# Patient Record
Sex: Female | Born: 1970 | Race: White | Hispanic: No | Marital: Married | Smoking: Never smoker
Health system: Southern US, Community
[De-identification: ages and names within clinical notes are randomized; demographics above are authoritative.]

## PROBLEM LIST (undated history)

## (undated) DIAGNOSIS — R112 Nausea with vomiting, unspecified: Secondary | ICD-10-CM

## (undated) DIAGNOSIS — Z87442 Personal history of urinary calculi: Secondary | ICD-10-CM

## (undated) DIAGNOSIS — Z794 Long term (current) use of insulin: Secondary | ICD-10-CM

## (undated) DIAGNOSIS — R319 Hematuria, unspecified: Secondary | ICD-10-CM

## (undated) DIAGNOSIS — IMO0001 Reserved for inherently not codable concepts without codable children: Secondary | ICD-10-CM

## (undated) DIAGNOSIS — G4733 Obstructive sleep apnea (adult) (pediatric): Secondary | ICD-10-CM

## (undated) DIAGNOSIS — N390 Urinary tract infection, site not specified: Secondary | ICD-10-CM

## (undated) DIAGNOSIS — Z923 Personal history of irradiation: Secondary | ICD-10-CM

## (undated) DIAGNOSIS — Z8542 Personal history of malignant neoplasm of other parts of uterus: Secondary | ICD-10-CM

## (undated) DIAGNOSIS — C801 Malignant (primary) neoplasm, unspecified: Secondary | ICD-10-CM

## (undated) DIAGNOSIS — R3915 Urgency of urination: Secondary | ICD-10-CM

## (undated) DIAGNOSIS — R0789 Other chest pain: Secondary | ICD-10-CM

## (undated) DIAGNOSIS — E669 Obesity, unspecified: Secondary | ICD-10-CM

## (undated) DIAGNOSIS — Z8639 Personal history of other endocrine, nutritional and metabolic disease: Secondary | ICD-10-CM

## (undated) DIAGNOSIS — N3281 Overactive bladder: Secondary | ICD-10-CM

## (undated) DIAGNOSIS — Z9889 Other specified postprocedural states: Secondary | ICD-10-CM

## (undated) DIAGNOSIS — K7460 Unspecified cirrhosis of liver: Secondary | ICD-10-CM

## (undated) DIAGNOSIS — H9190 Unspecified hearing loss, unspecified ear: Secondary | ICD-10-CM

## (undated) DIAGNOSIS — E785 Hyperlipidemia, unspecified: Secondary | ICD-10-CM

## (undated) DIAGNOSIS — R0600 Dyspnea, unspecified: Secondary | ICD-10-CM

## (undated) DIAGNOSIS — R748 Abnormal levels of other serum enzymes: Secondary | ICD-10-CM

## (undated) DIAGNOSIS — E119 Type 2 diabetes mellitus without complications: Secondary | ICD-10-CM

## (undated) DIAGNOSIS — F909 Attention-deficit hyperactivity disorder, unspecified type: Secondary | ICD-10-CM

## (undated) DIAGNOSIS — R1031 Right lower quadrant pain: Secondary | ICD-10-CM

## (undated) DIAGNOSIS — F609 Personality disorder, unspecified: Secondary | ICD-10-CM

## (undated) DIAGNOSIS — M1990 Unspecified osteoarthritis, unspecified site: Secondary | ICD-10-CM

## (undated) DIAGNOSIS — K219 Gastro-esophageal reflux disease without esophagitis: Secondary | ICD-10-CM

## (undated) DIAGNOSIS — F329 Major depressive disorder, single episode, unspecified: Secondary | ICD-10-CM

## (undated) DIAGNOSIS — R632 Polyphagia: Secondary | ICD-10-CM

## (undated) DIAGNOSIS — F32A Depression, unspecified: Secondary | ICD-10-CM

## (undated) DIAGNOSIS — F419 Anxiety disorder, unspecified: Secondary | ICD-10-CM

## (undated) DIAGNOSIS — K429 Umbilical hernia without obstruction or gangrene: Secondary | ICD-10-CM

## (undated) DIAGNOSIS — F603 Borderline personality disorder: Secondary | ICD-10-CM

## (undated) DIAGNOSIS — E890 Postprocedural hypothyroidism: Secondary | ICD-10-CM

## (undated) DIAGNOSIS — N201 Calculus of ureter: Secondary | ICD-10-CM

## (undated) HISTORY — DX: Malignant (primary) neoplasm, unspecified: C80.1

## (undated) HISTORY — PX: ENDOMETRIAL BIOPSY: SHX622

## (undated) HISTORY — DX: Urinary tract infection, site not specified: N39.0

## (undated) HISTORY — PX: MOUTH SURGERY: SHX715

## (undated) HISTORY — DX: Right lower quadrant pain: R10.31

## (undated) HISTORY — PX: ROBOTIC ASSISTED TOTAL HYSTERECTOMY WITH BILATERAL SALPINGO OOPHERECTOMY: SHX6086

## (undated) HISTORY — DX: Depression, unspecified: F32.A

## (undated) HISTORY — DX: Hyperlipidemia, unspecified: E78.5

## (undated) HISTORY — DX: Polyphagia: R63.2

## (undated) HISTORY — DX: Gastro-esophageal reflux disease without esophagitis: K21.9

## (undated) HISTORY — DX: Overactive bladder: N32.81

## (undated) HISTORY — DX: Personality disorder, unspecified: F60.9

## (undated) HISTORY — DX: Major depressive disorder, single episode, unspecified: F32.9

## (undated) HISTORY — PX: TONSILLECTOMY: SUR1361

---

## 1991-06-20 HISTORY — PX: TYMPANOPLASTY: SHX33

## 1999-09-22 ENCOUNTER — Encounter: Payer: Self-pay | Admitting: Cardiology

## 1999-09-22 ENCOUNTER — Ambulatory Visit (HOSPITAL_COMMUNITY): Admission: RE | Admit: 1999-09-22 | Discharge: 1999-09-22 | Payer: Self-pay | Admitting: Cardiology

## 2000-02-02 ENCOUNTER — Emergency Department (HOSPITAL_COMMUNITY): Admission: EM | Admit: 2000-02-02 | Discharge: 2000-02-02 | Payer: Self-pay | Admitting: Emergency Medicine

## 2000-02-02 ENCOUNTER — Encounter: Payer: Self-pay | Admitting: Emergency Medicine

## 2000-09-13 ENCOUNTER — Ambulatory Visit (HOSPITAL_BASED_OUTPATIENT_CLINIC_OR_DEPARTMENT_OTHER): Admission: RE | Admit: 2000-09-13 | Discharge: 2000-09-13 | Payer: Self-pay | Admitting: *Deleted

## 2000-09-13 HISTORY — PX: OTHER SURGICAL HISTORY: SHX169

## 2000-12-05 ENCOUNTER — Other Ambulatory Visit: Admission: RE | Admit: 2000-12-05 | Discharge: 2000-12-05 | Payer: Self-pay | Admitting: Obstetrics and Gynecology

## 2001-06-28 ENCOUNTER — Ambulatory Visit (HOSPITAL_COMMUNITY): Admission: RE | Admit: 2001-06-28 | Discharge: 2001-06-28 | Payer: Self-pay | Admitting: Cardiology

## 2001-06-28 ENCOUNTER — Encounter: Payer: Self-pay | Admitting: Cardiology

## 2001-12-14 ENCOUNTER — Emergency Department (HOSPITAL_COMMUNITY): Admission: EM | Admit: 2001-12-14 | Discharge: 2001-12-14 | Payer: Self-pay | Admitting: Emergency Medicine

## 2002-06-20 ENCOUNTER — Ambulatory Visit (HOSPITAL_COMMUNITY): Admission: RE | Admit: 2002-06-20 | Discharge: 2002-06-20 | Payer: Self-pay | Admitting: Otolaryngology

## 2002-09-24 ENCOUNTER — Encounter: Payer: Self-pay | Admitting: Emergency Medicine

## 2002-09-24 ENCOUNTER — Emergency Department (HOSPITAL_COMMUNITY): Admission: EM | Admit: 2002-09-24 | Discharge: 2002-09-24 | Payer: Self-pay | Admitting: Emergency Medicine

## 2002-11-10 ENCOUNTER — Encounter: Admission: RE | Admit: 2002-11-10 | Discharge: 2002-12-24 | Payer: Self-pay | Admitting: Specialist

## 2004-03-06 ENCOUNTER — Emergency Department (HOSPITAL_COMMUNITY): Admission: EM | Admit: 2004-03-06 | Discharge: 2004-03-06 | Payer: Self-pay | Admitting: Emergency Medicine

## 2004-05-20 ENCOUNTER — Ambulatory Visit (HOSPITAL_COMMUNITY): Admission: RE | Admit: 2004-05-20 | Discharge: 2004-05-20 | Payer: Self-pay | Admitting: Cardiology

## 2004-05-30 ENCOUNTER — Emergency Department (HOSPITAL_COMMUNITY): Admission: EM | Admit: 2004-05-30 | Discharge: 2004-05-30 | Payer: Self-pay | Admitting: Emergency Medicine

## 2004-05-30 IMAGING — CR DG TIBIA/FIBULA 2V*L*
2 series · 2 of 2 positions shown · non-contrast
Comparison: none

CLINICAL DATA: Fall.  Left leg trauma and pain.
 LEFT TIBIA/FIBULA - 2 VIEW:
 Mild soft tissue swelling is seen along the lateral aspect of the distal fibula.  There is no evidence of fracture or other bone lesions.

[view not recorded (1 of 2)]
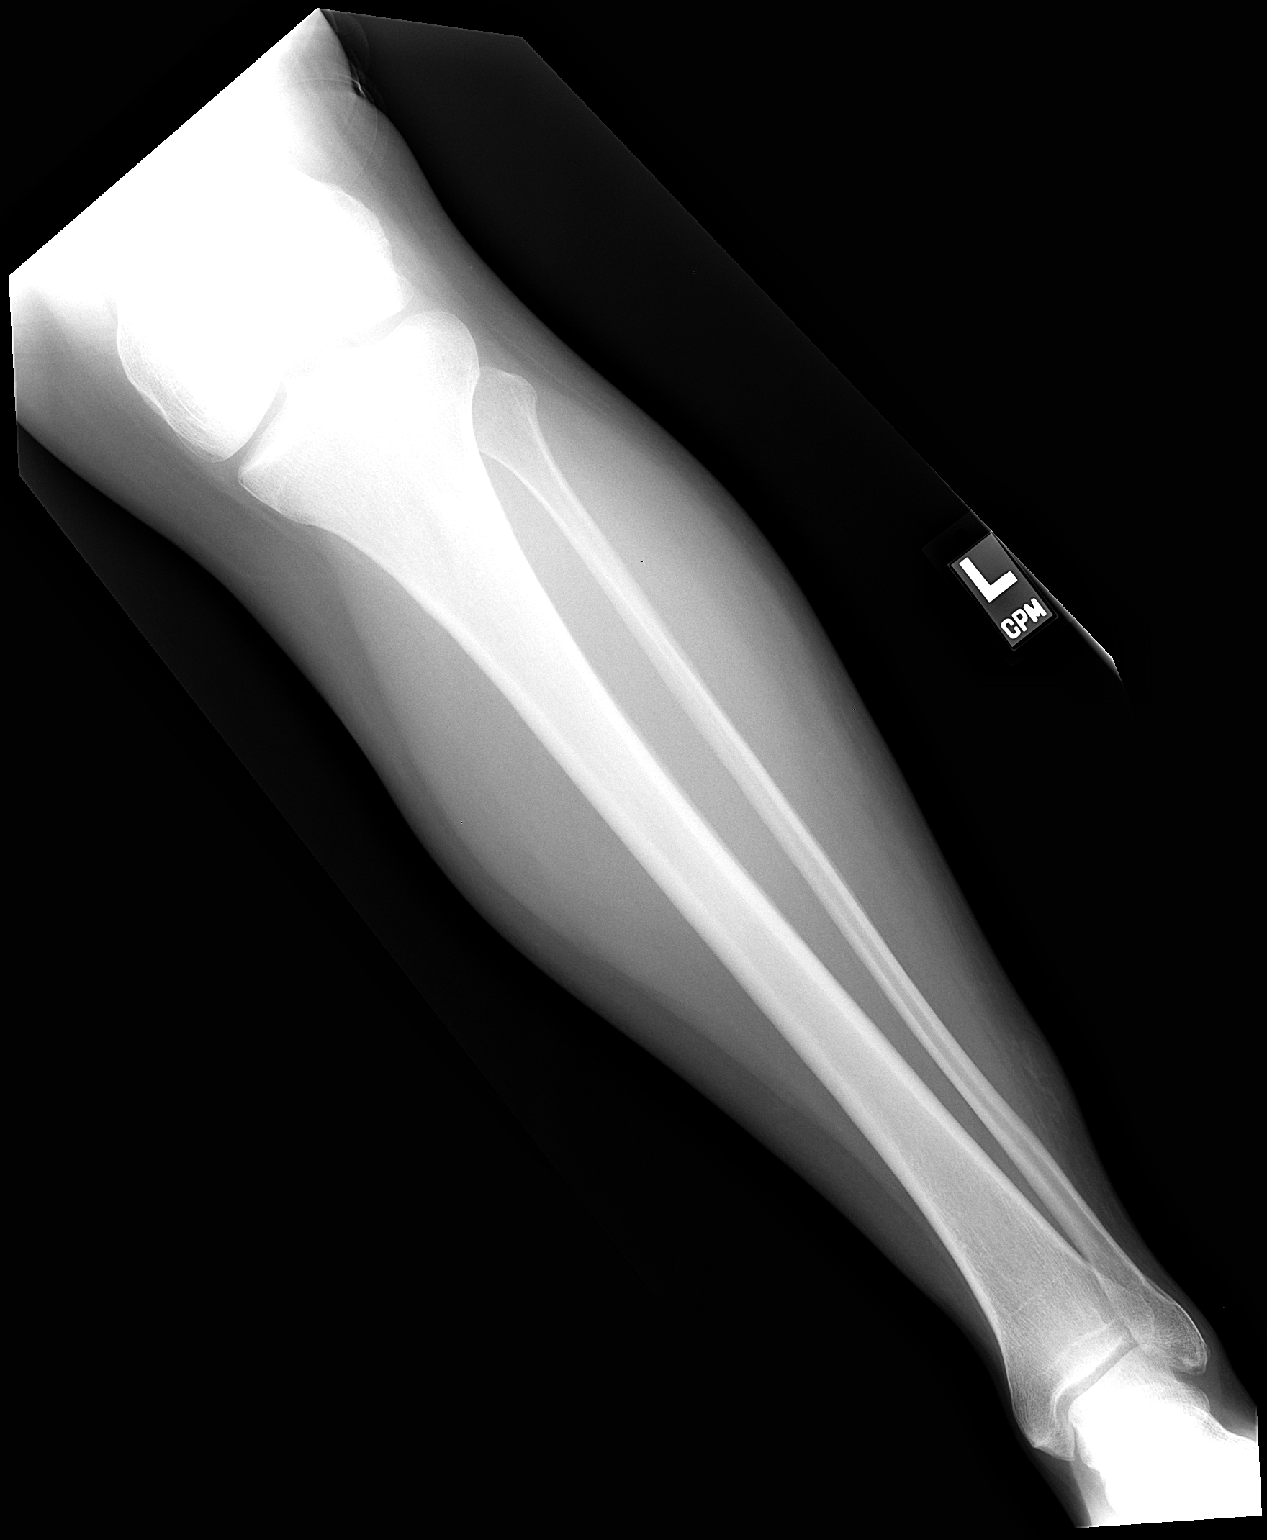

[view not recorded (2 of 2)]
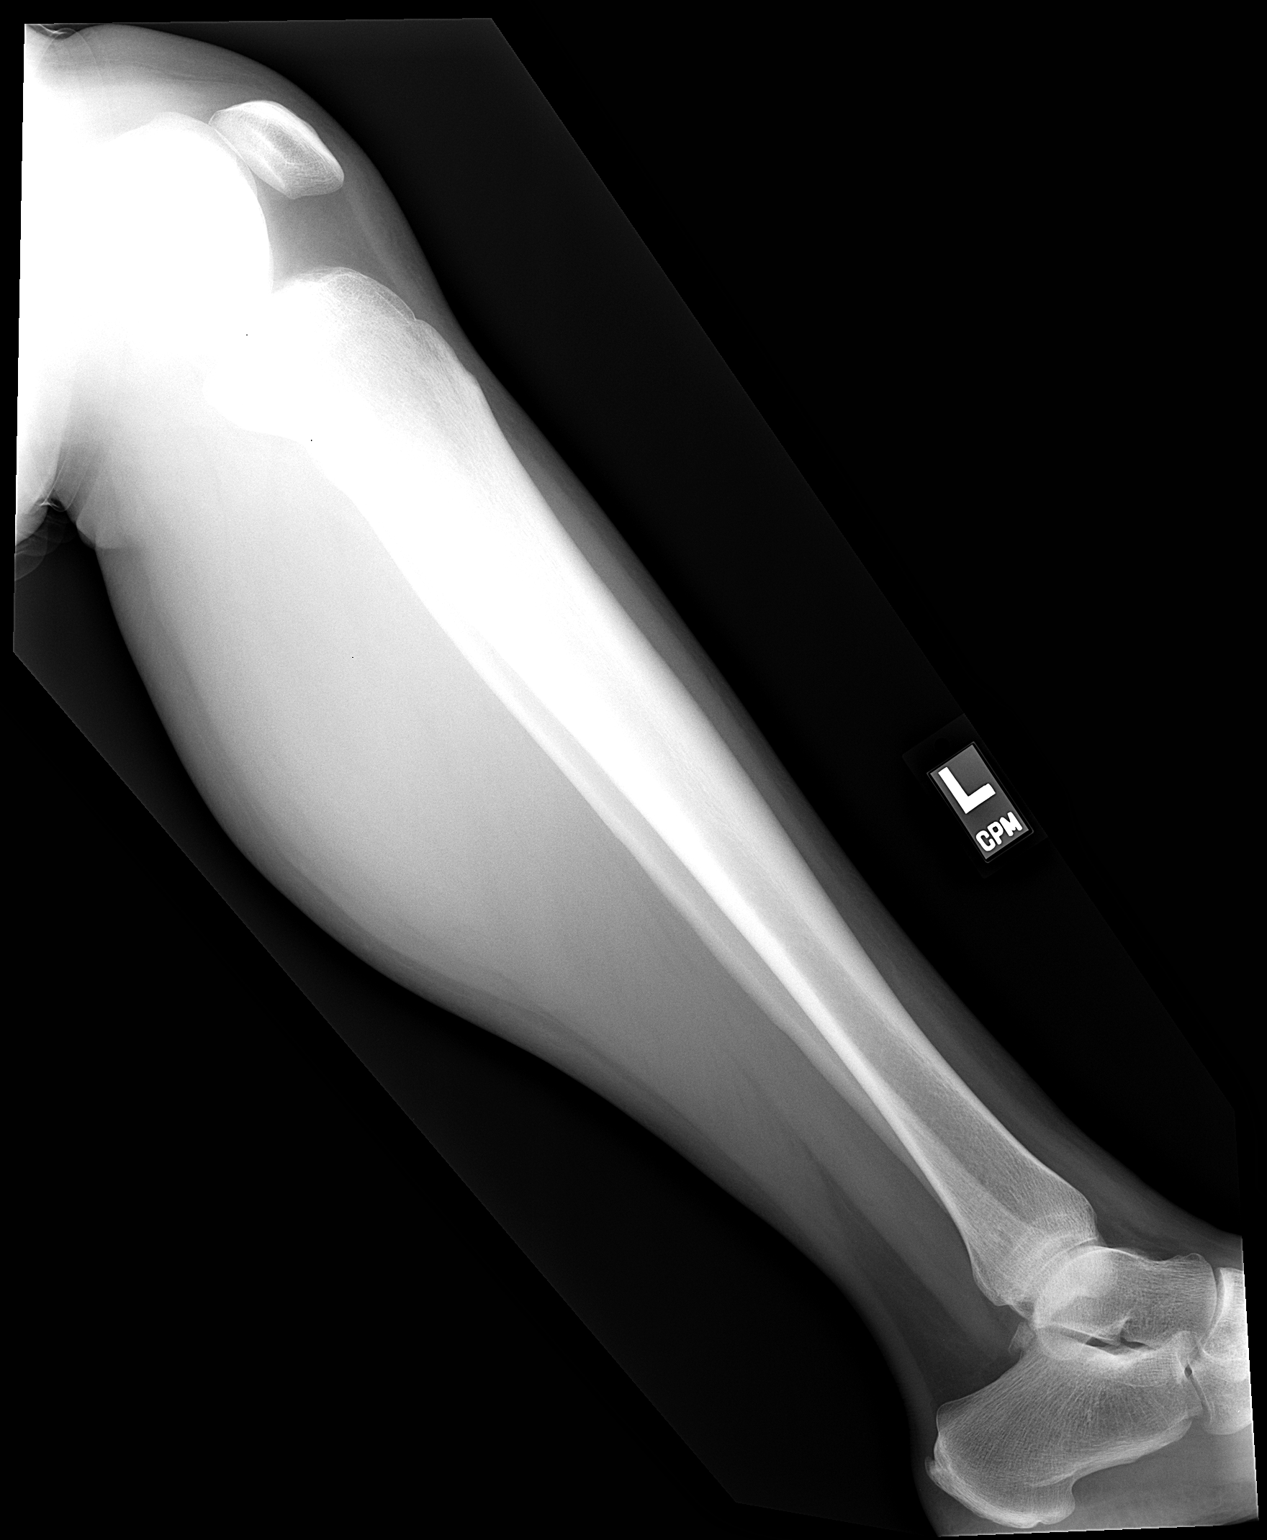

[2 of 2 positions shown; findings below may reference images not displayed]

IMPRESSION: Mild lateral soft tissue swelling of the lower leg. No evidence of fracture.

## 2004-08-08 ENCOUNTER — Other Ambulatory Visit: Admission: RE | Admit: 2004-08-08 | Discharge: 2004-08-08 | Payer: Self-pay | Admitting: Obstetrics and Gynecology

## 2005-08-09 ENCOUNTER — Other Ambulatory Visit: Admission: RE | Admit: 2005-08-09 | Discharge: 2005-08-09 | Payer: Self-pay | Admitting: Obstetrics and Gynecology

## 2007-04-29 ENCOUNTER — Ambulatory Visit (HOSPITAL_COMMUNITY): Admission: RE | Admit: 2007-04-29 | Discharge: 2007-04-29 | Payer: Self-pay | Admitting: Cardiology

## 2007-04-29 IMAGING — CR DG LUMBAR SPINE COMPLETE 4+V
5 series · 5 of 5 positions shown · non-contrast
Comparison: Lumbar spine series [DATE].

CLINICAL DATA: Back pain. Sharp mid back and bilateral leg pain.
LUMBAR SPINE ? 5 VIEW:

[t l-spine a.p.]
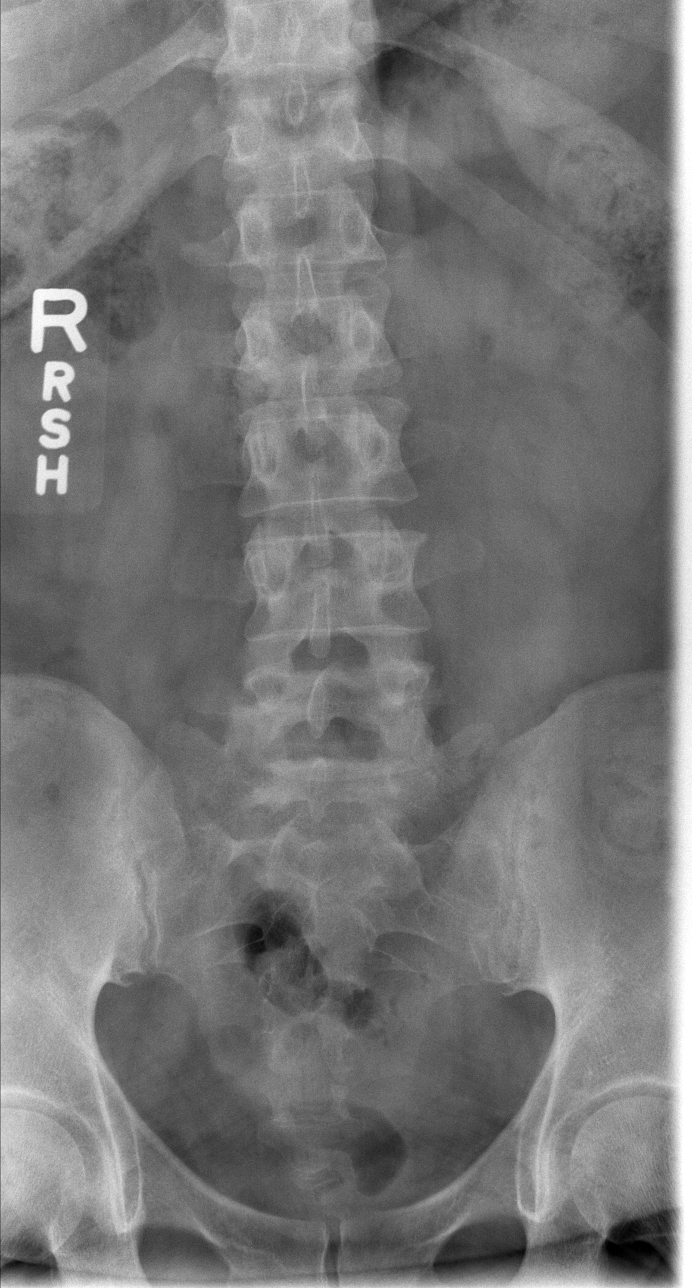

[t l-spine oblique exposure (1 of 2)]
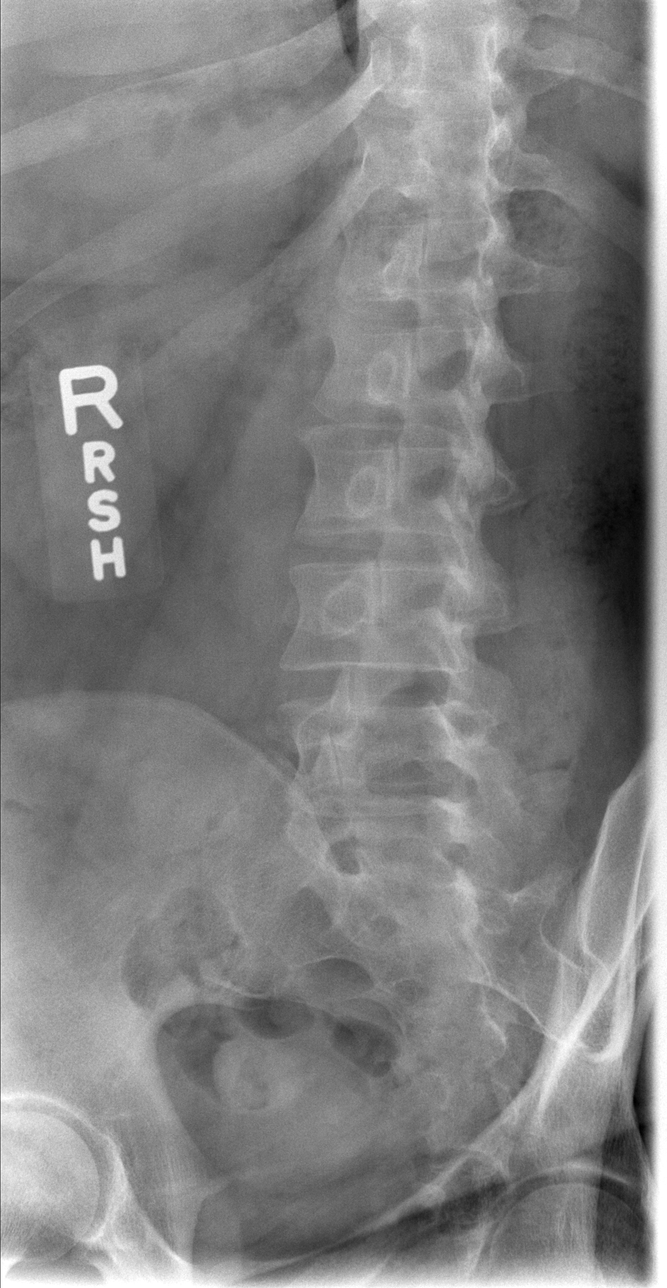

[t l-spine oblique exposure (2 of 2)]
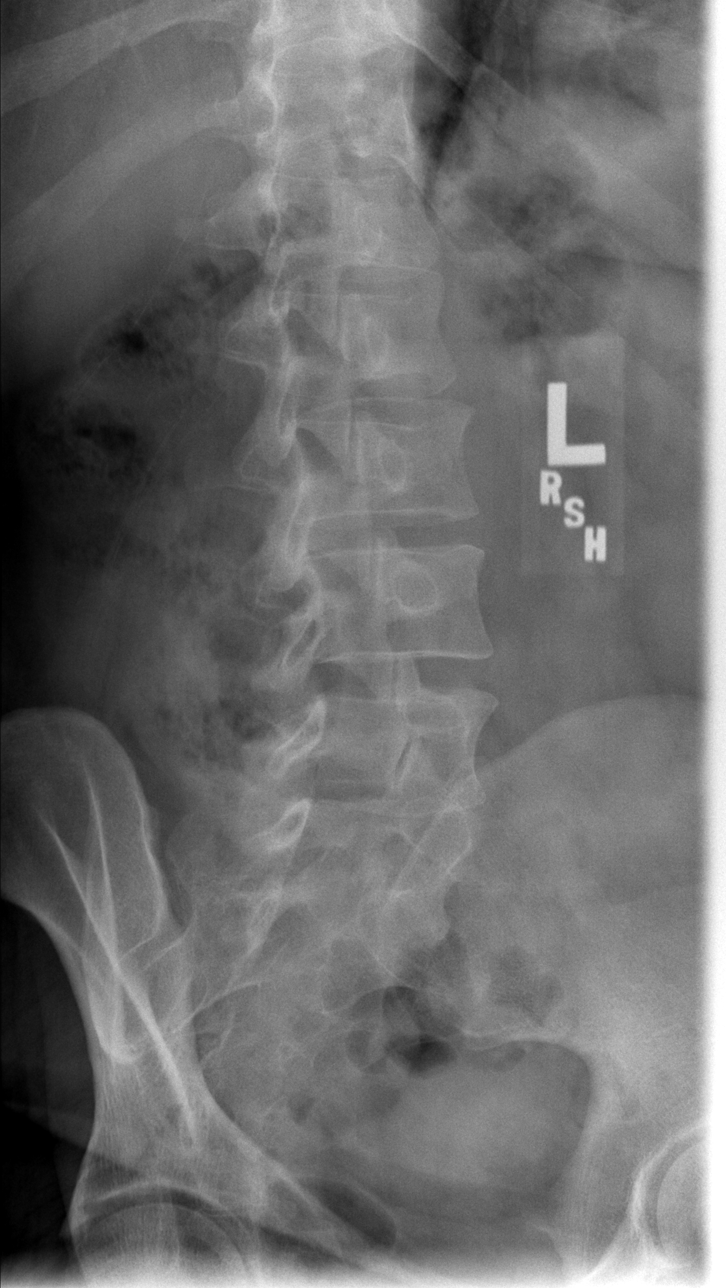

[t l-spine lat]
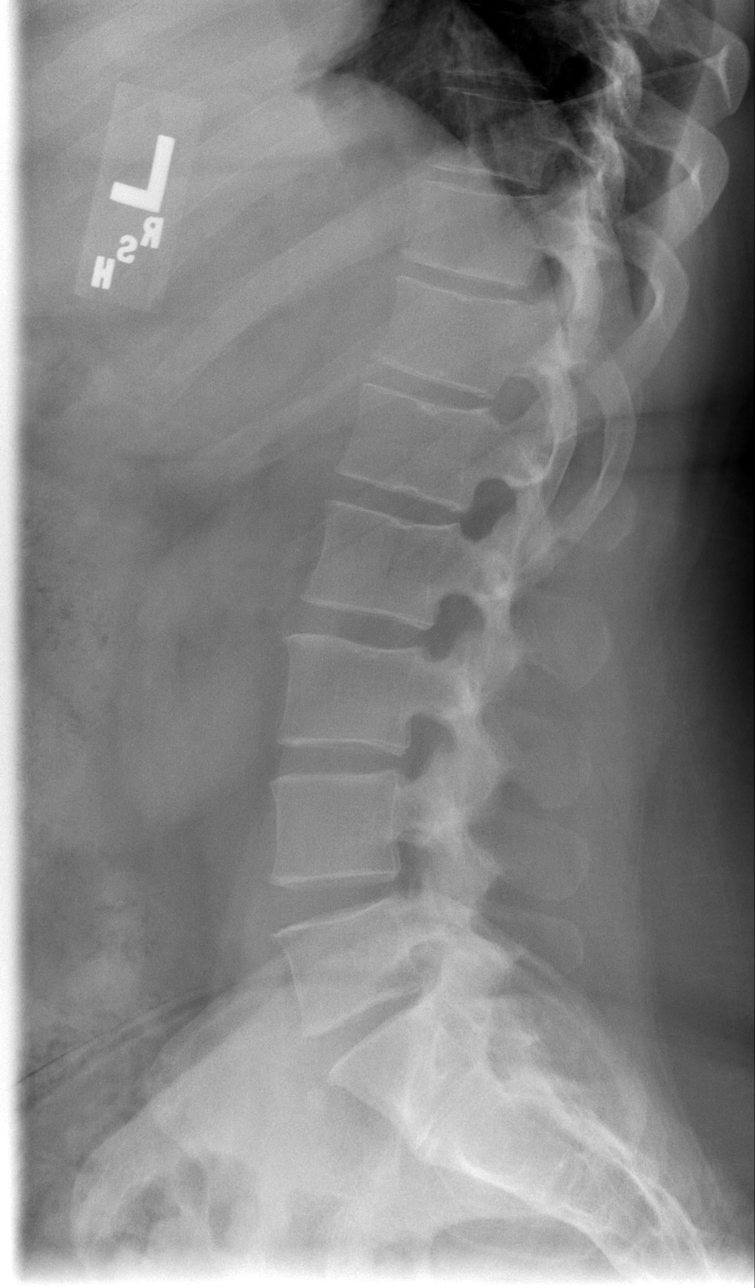

[t l-spine l5-s1 spot]
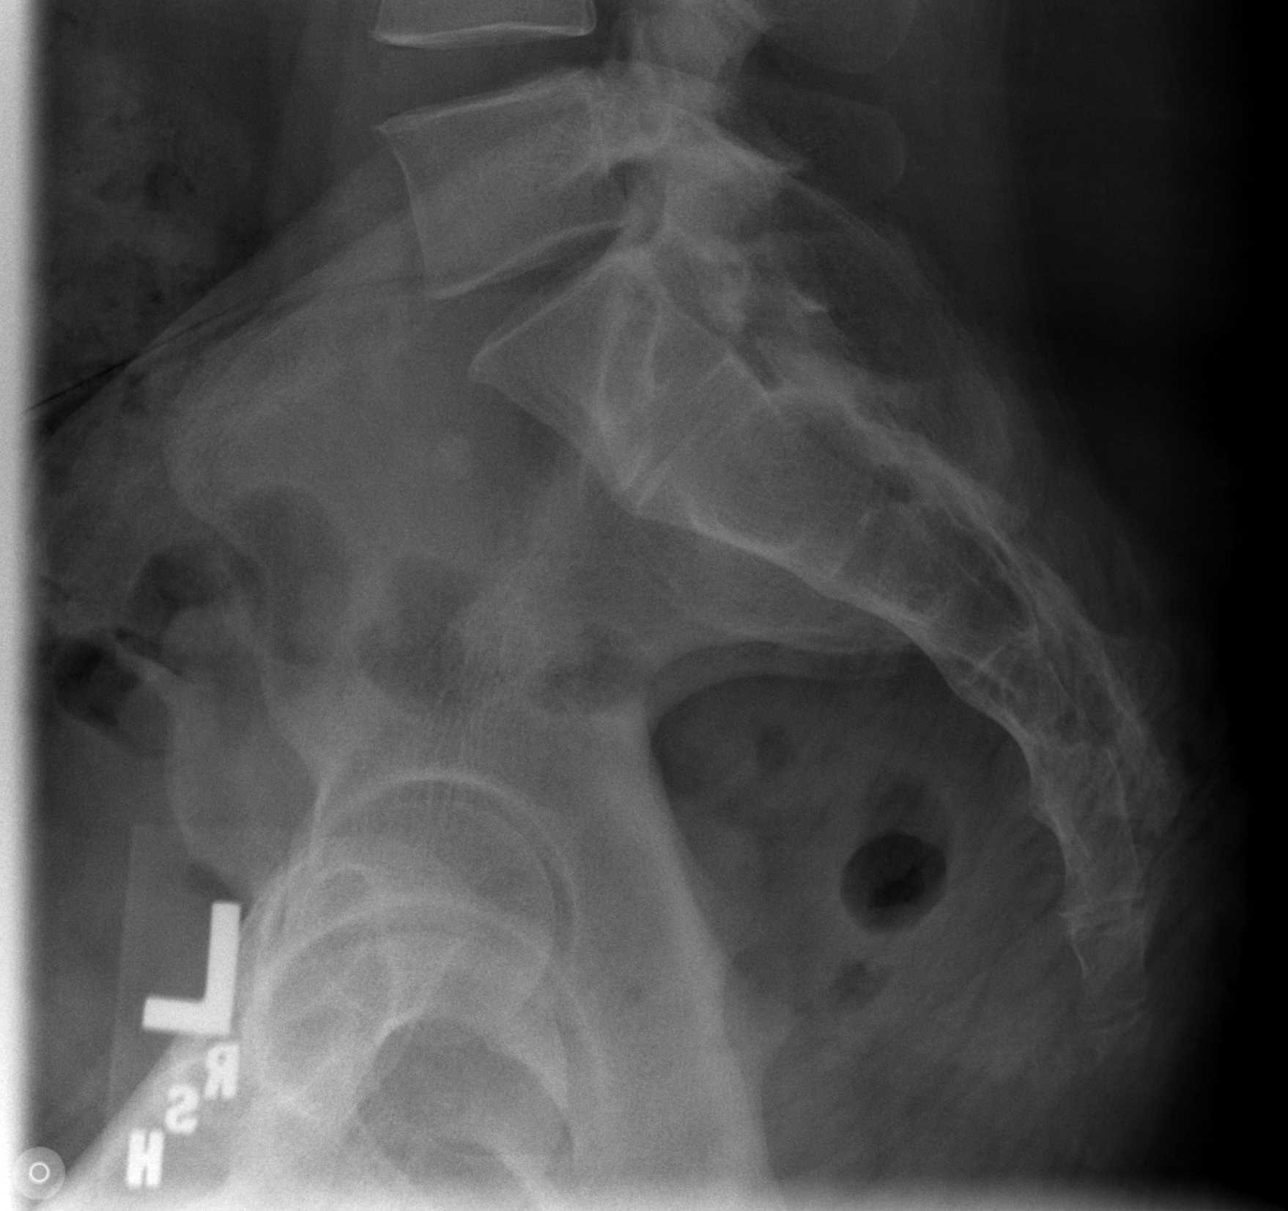

[5 of 5 positions shown; findings below may reference images not displayed]

FINDINGS: Five lumbar type vertebral bodies are identified.  A very mild dextroconvex lumbar scoliosis is noted.  Vertebral body height is preserved.  Generally, intervertebral disk space is preserved. Dedicated views of the lumbosacral junction appear within normal limits.
Again noted is a lumbosacral transitional vertebra, and this is seen in conjunction with 5 non-rib bearing lumbar type vertebral bodies.
IMPRESSION: 1.  Mild dextroconvex lumbar scoliosis which may be positional or secondary to spasm.
2.  No acute findings in the lumbar spine.
3.  Lumbosacral transitional anatomy.

## 2007-05-24 ENCOUNTER — Emergency Department (HOSPITAL_COMMUNITY): Admission: EM | Admit: 2007-05-24 | Discharge: 2007-05-24 | Payer: Self-pay | Admitting: Emergency Medicine

## 2007-05-24 ENCOUNTER — Ambulatory Visit: Payer: Self-pay | Admitting: *Deleted

## 2007-05-24 ENCOUNTER — Encounter (INDEPENDENT_AMBULATORY_CARE_PROVIDER_SITE_OTHER): Payer: Self-pay | Admitting: Emergency Medicine

## 2008-06-09 ENCOUNTER — Encounter (HOSPITAL_COMMUNITY): Admission: RE | Admit: 2008-06-09 | Discharge: 2008-06-15 | Payer: Self-pay | Admitting: Cardiology

## 2008-06-09 HISTORY — PX: CARDIOVASCULAR STRESS TEST: SHX262

## 2009-06-22 ENCOUNTER — Encounter: Admission: RE | Admit: 2009-06-22 | Discharge: 2009-06-22 | Payer: Self-pay | Admitting: Obstetrics and Gynecology

## 2009-07-01 ENCOUNTER — Encounter: Payer: Self-pay | Admitting: Obstetrics and Gynecology

## 2009-07-01 ENCOUNTER — Encounter: Admission: RE | Admit: 2009-07-01 | Discharge: 2009-07-01 | Payer: Self-pay | Admitting: Obstetrics and Gynecology

## 2009-07-09 ENCOUNTER — Ambulatory Visit: Admission: RE | Admit: 2009-07-09 | Discharge: 2009-07-09 | Payer: Self-pay | Admitting: Gynecology

## 2009-08-08 ENCOUNTER — Emergency Department (HOSPITAL_COMMUNITY): Admission: EM | Admit: 2009-08-08 | Discharge: 2009-08-08 | Payer: Self-pay | Admitting: Emergency Medicine

## 2009-08-08 IMAGING — CT CT ABD-PELV W/ CM
2 of 4 series · 17 of 46 positions shown, 19 images · IV contrast (APPLIED)
Comparison: None available.

CLINICAL DATA: Patient status post hysterectomy for uterine cancer.
Fever.

CT ABDOMEN AND PELVIS WITH CONTRAST
TECHNIQUE: Multidetector CT imaging of the abdomen and pelvis was
performed following the standard protocol during bolus
administration of intravenous contrast.
Contrast: One or 25 ml [EU].

[Series 2: abd_pel 5.0 b40f st · axial · 0.92mm/px · z∈[-490,-50]mm · 14 of 96 slices shown, 16 images]
[im 4/96  soft-tissue]
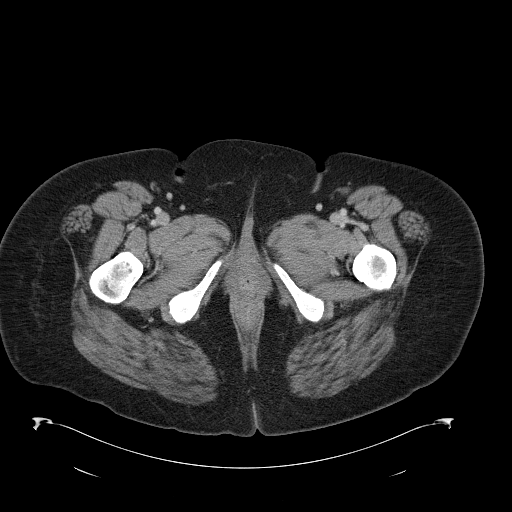
[im 4/96  bone]
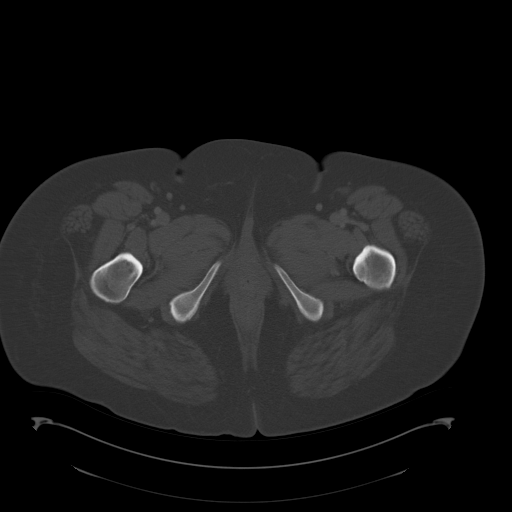
[im 12/96  soft-tissue]
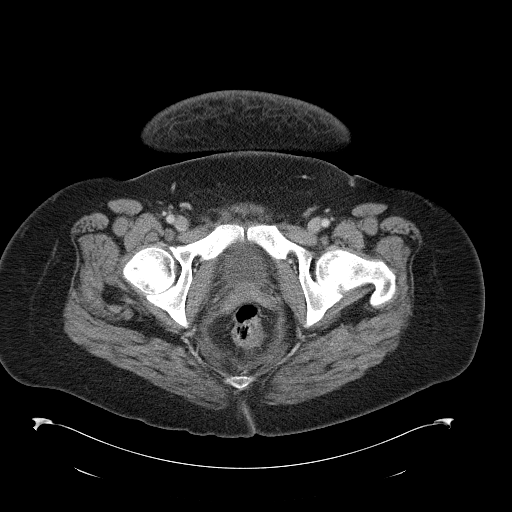
[im 20/96  soft-tissue]
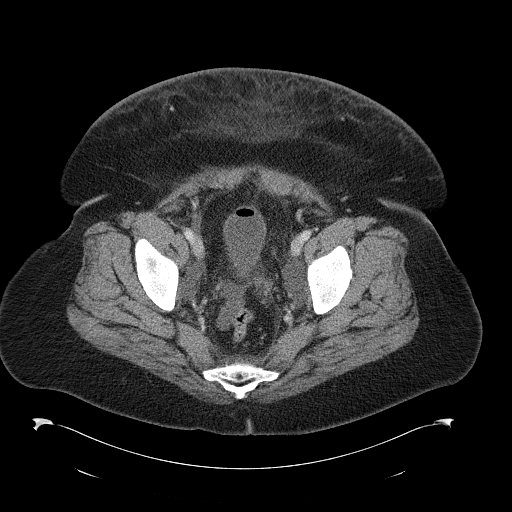
[im 24/96  soft-tissue]
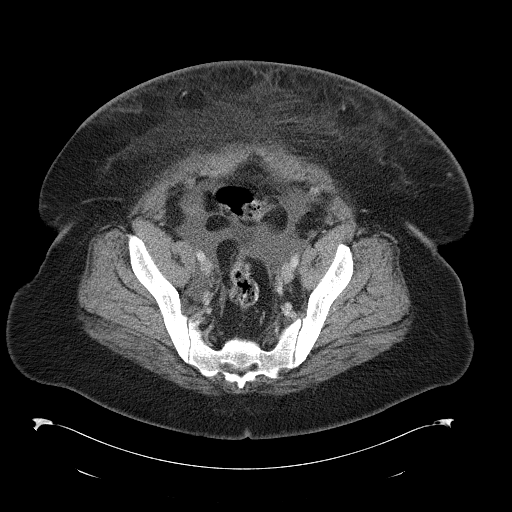
[im 32/96  soft-tissue]
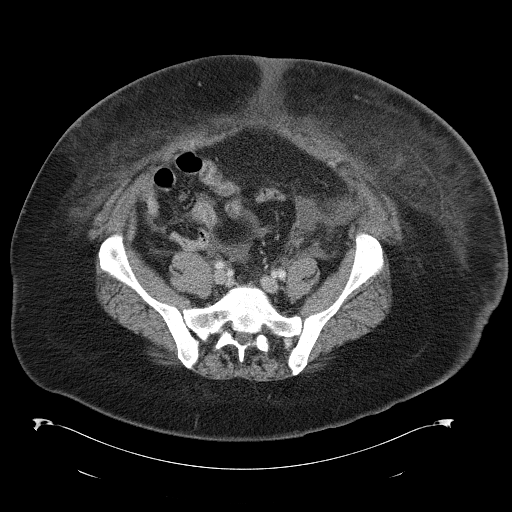
[im 40/96  soft-tissue]
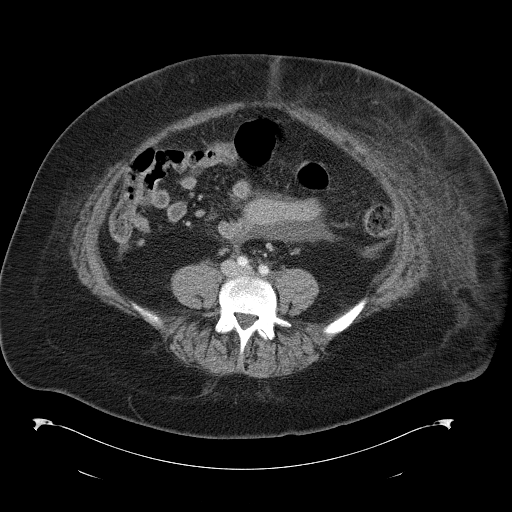
[im 44/96  soft-tissue]
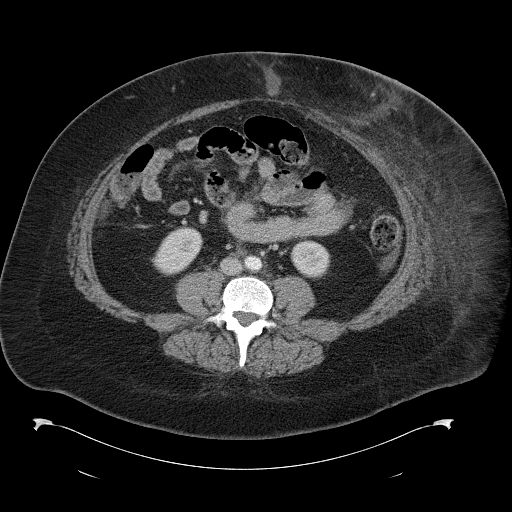
[im 52/96  soft-tissue]
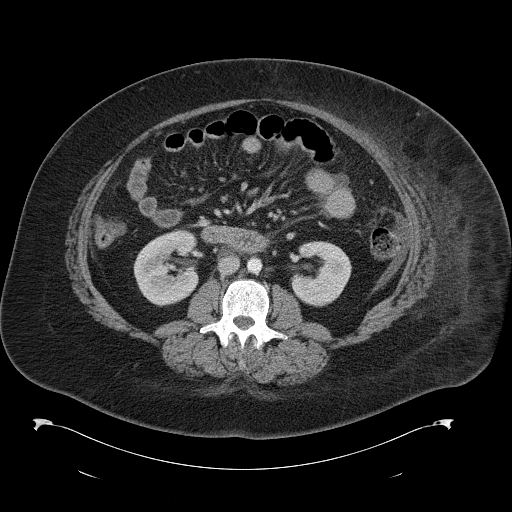
[im 56/96  soft-tissue]
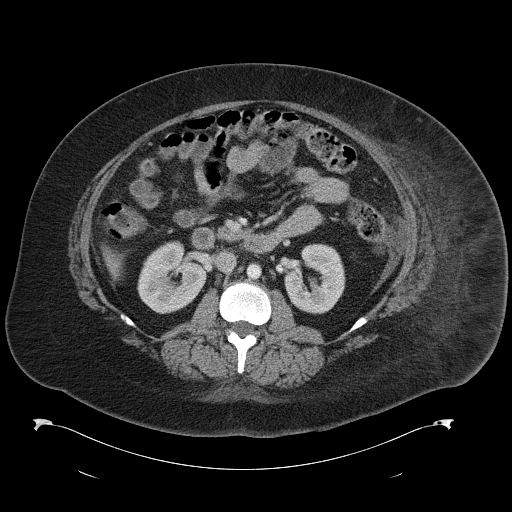
[im 56/96  bone]
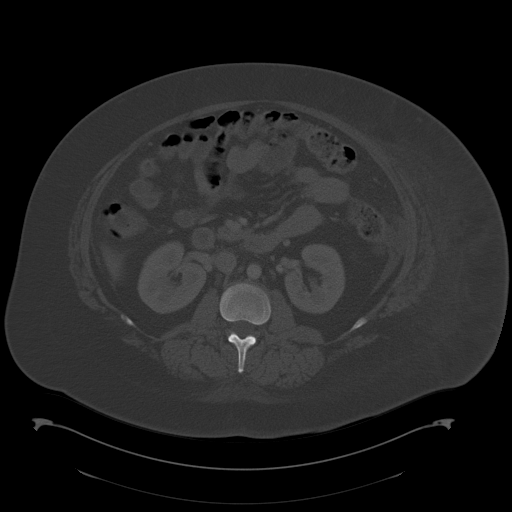
[im 64/96  soft-tissue]
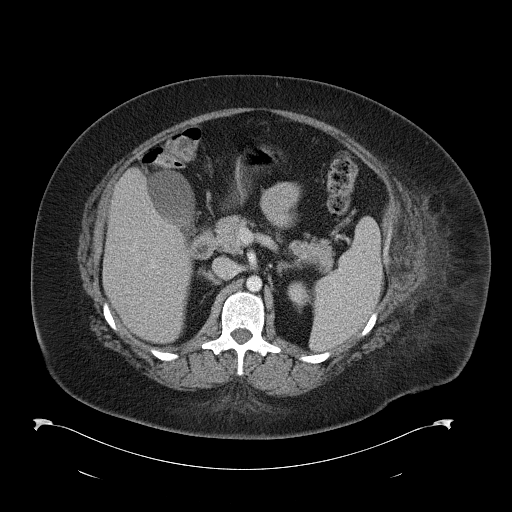
[im 72/96  soft-tissue]
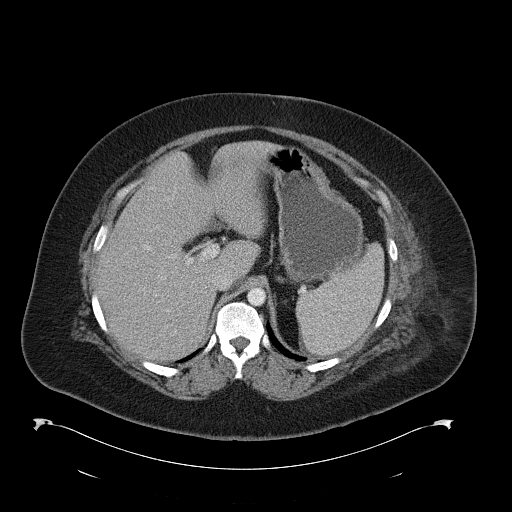
[im 76/96  soft-tissue]
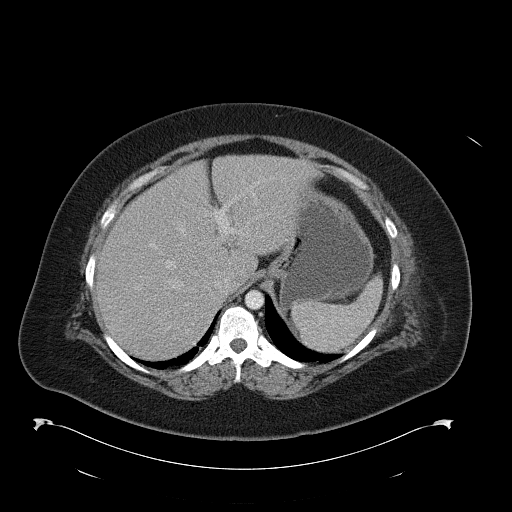
[im 84/96  soft-tissue]
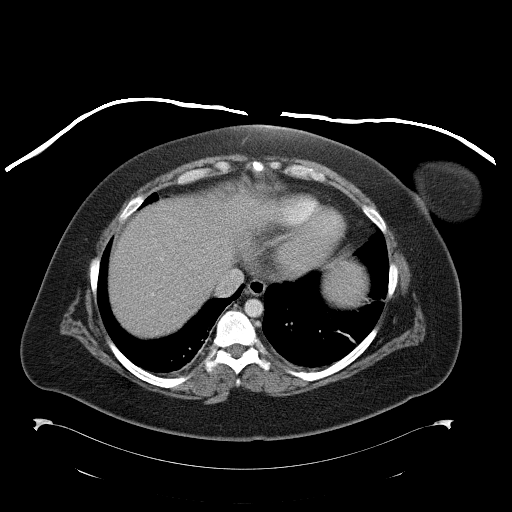
[im 92/96  soft-tissue]
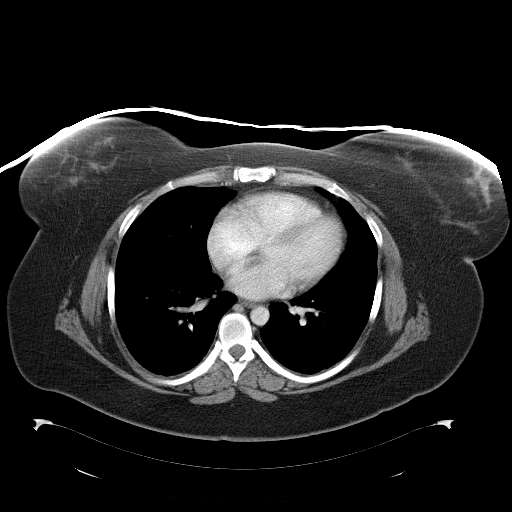

[Series 602: coronal abdomen · coronal · 0.98mm/px · 3 of 161 slices shown]
[im 54/161  soft-tissue]
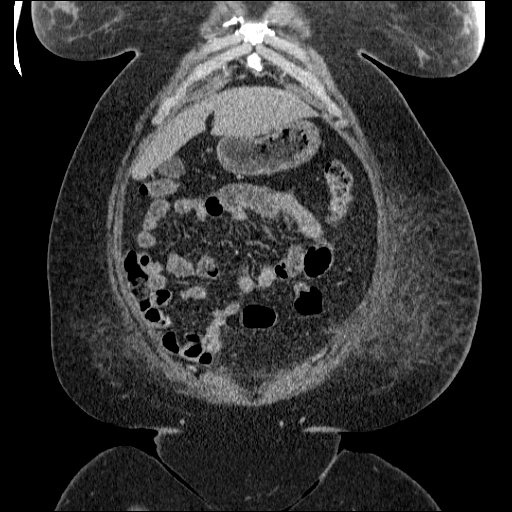
[im 72/161  soft-tissue]
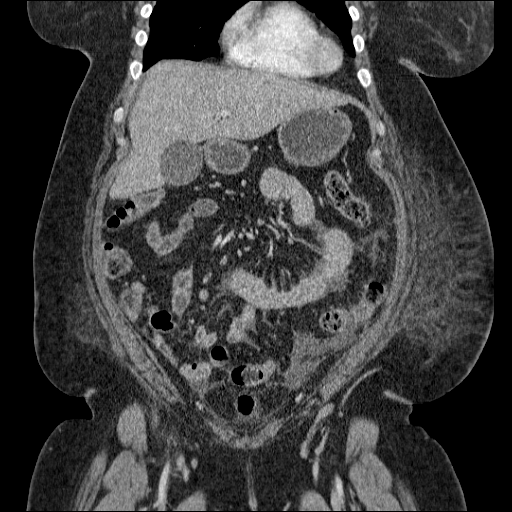
[im 89/161  soft-tissue]
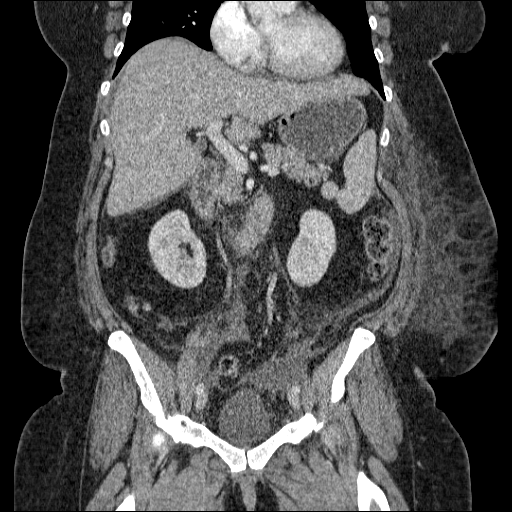

[17 of 46 positions shown; findings below may reference images not displayed]

FINDINGS: There is some mild dependent atelectatic change in the
lung bases.  No pleural or pericardial effusion.

There is a small amount of perihepatic and intra-abdominal and
pelvic ascites.  No organized fluid collection to suggest abscess
is identified.  There is diffuse infiltration of subcutaneous fat
of the abdomen, particularly on the left but no focal fluid
collection in the subcutaneous tissues is identified.  The patient
is status post hysterectomy.  Urinary bladder is unremarkable.  No
free intraperitoneal air is identified.

The liver is unremarkable.  There is some increased attenuation in
the gallbladder compatible with sludge.  No evidence of
cholecystitis.  The adrenal glands, spleen, pancreas and kidneys
appear normal.  Stomach and small bowel are unremarkable.  The
colon has a normal CT appearance.  The appendix is unremarkable.
There is no focal bony abnormality.
IMPRESSION: 1.  Negative for abscess.  Small volume of abdominal ascites is
compatible with recent hysterectomy.
2.  Infiltration of subcutaneous fat, particularly about the left
side of the abdomen is likely postoperative.  Cellulitis could
create similar appearance.
3.  Likely gallbladder sludge.  No evidence of cholecystitis.

## 2009-08-12 ENCOUNTER — Ambulatory Visit: Admission: RE | Admit: 2009-08-12 | Discharge: 2009-08-12 | Payer: Self-pay | Admitting: Gynecologic Oncology

## 2009-08-26 ENCOUNTER — Ambulatory Visit: Admission: RE | Admit: 2009-08-26 | Discharge: 2009-08-26 | Payer: Self-pay | Admitting: Gynecologic Oncology

## 2009-08-31 ENCOUNTER — Ambulatory Visit: Admission: RE | Admit: 2009-08-31 | Discharge: 2009-10-14 | Payer: Self-pay | Admitting: Radiation Oncology

## 2009-09-29 LAB — URINALYSIS, MICROSCOPIC - CHCC
Bilirubin (Urine): NEGATIVE
Glucose: NEGATIVE g/dL
Ketones: NEGATIVE mg/dL
Nitrite: NEGATIVE
Protein: NEGATIVE mg/dL
Specific Gravity, Urine: 1.025 (ref 1.003–1.035)
pH: 6 (ref 4.6–8.0)

## 2009-10-02 LAB — URINE CULTURE

## 2009-12-29 ENCOUNTER — Other Ambulatory Visit: Admission: RE | Admit: 2009-12-29 | Discharge: 2009-12-29 | Payer: Self-pay | Admitting: Gynecology

## 2009-12-29 ENCOUNTER — Ambulatory Visit: Admission: RE | Admit: 2009-12-29 | Discharge: 2009-12-29 | Payer: Self-pay | Admitting: Gynecology

## 2010-02-09 ENCOUNTER — Ambulatory Visit: Payer: Self-pay | Admitting: Pulmonary Disease

## 2010-02-09 DIAGNOSIS — E785 Hyperlipidemia, unspecified: Secondary | ICD-10-CM | POA: Insufficient documentation

## 2010-02-09 DIAGNOSIS — E119 Type 2 diabetes mellitus without complications: Secondary | ICD-10-CM | POA: Insufficient documentation

## 2010-02-09 DIAGNOSIS — G4733 Obstructive sleep apnea (adult) (pediatric): Secondary | ICD-10-CM | POA: Insufficient documentation

## 2010-02-09 DIAGNOSIS — Z8542 Personal history of malignant neoplasm of other parts of uterus: Secondary | ICD-10-CM | POA: Insufficient documentation

## 2010-02-09 DIAGNOSIS — J309 Allergic rhinitis, unspecified: Secondary | ICD-10-CM | POA: Insufficient documentation

## 2010-03-08 ENCOUNTER — Encounter: Payer: Self-pay | Admitting: Pulmonary Disease

## 2010-03-10 ENCOUNTER — Encounter: Payer: Self-pay | Admitting: Pulmonary Disease

## 2010-03-10 ENCOUNTER — Ambulatory Visit (HOSPITAL_BASED_OUTPATIENT_CLINIC_OR_DEPARTMENT_OTHER): Admission: RE | Admit: 2010-03-10 | Discharge: 2010-03-10 | Payer: Self-pay | Admitting: Pulmonary Disease

## 2010-03-21 ENCOUNTER — Telehealth (INDEPENDENT_AMBULATORY_CARE_PROVIDER_SITE_OTHER): Payer: Self-pay | Admitting: *Deleted

## 2010-03-24 ENCOUNTER — Ambulatory Visit: Payer: Self-pay | Admitting: Pulmonary Disease

## 2010-03-30 ENCOUNTER — Ambulatory Visit: Payer: Self-pay | Admitting: Pulmonary Disease

## 2010-04-27 ENCOUNTER — Ambulatory Visit: Payer: Self-pay | Admitting: Pulmonary Disease

## 2010-05-19 ENCOUNTER — Ambulatory Visit
Admission: RE | Admit: 2010-05-19 | Discharge: 2010-05-19 | Payer: Self-pay | Source: Home / Self Care | Admitting: Radiation Oncology

## 2010-05-19 ENCOUNTER — Other Ambulatory Visit
Admission: RE | Admit: 2010-05-19 | Discharge: 2010-05-19 | Payer: Self-pay | Source: Home / Self Care | Admitting: Radiation Oncology

## 2010-07-05 ENCOUNTER — Encounter
Admission: RE | Admit: 2010-07-05 | Discharge: 2010-07-05 | Payer: Self-pay | Source: Home / Self Care | Attending: Cardiology | Admitting: Cardiology

## 2010-07-19 NOTE — Progress Notes (Signed)
Summary: f/u appt to discuss sleep study results.   Phone Note Call from Patient Call back at [facility] Phone [phone]   Caller: Patient Call For: clance Summary of Call: pt wants to know when she should return for f/u sleep study.  Initial call taken by: Tivis Ringer, CNA,  March 21, 2010 11:26 AM  Follow-up for Phone Call        [facility], have you read this pt's sleep study yet? It was done on 03/10/10 Pls advise thanks! Vernie Murders  March 21, 2010 11:29 AM  Follow-up by: Vernie Murders,  March 21, 2010 11:28 AM  Additional Follow-up for Phone Call Additional follow up Details #1::        don't know.  go ahead and make her a follow up for next week...not this week. Additional Follow-up by: Barbaraann Share MD,  March 21, 2010 5:46 PM    Additional Follow-up for Phone Call Additional follow up Details #2::    pt scheduled to see [facility] 03/30/2010 at 9:00am.  (pt states she needed an AM appt---couldn't do PM )   Told pt to arrive at 8:45am so we can have her worked up and ready for the doc at 9.  Aundra Millet Reynolds LPN  March 22, 2010 9:24 AM

## 2010-07-19 NOTE — Assessment & Plan Note (Signed)
Summary: consult for possible osa   Copy to:  Harwani Primary Provider/Referring Provider:  Spruill  CC:  Sleep Consult.  History of Present Illness: The pt is a 40y/o female who I have been asked to see for possible osa.  She has been noted to have loud snoring, as well as an abnormal breathing pattern during sleep.  She goes to bed btw 11pm-1am, and arises at 6am to start her day.  She has frequent awakenings during the night, and is not rested in the am's upon arising.  She admits that she has a lot of chronic back pain issues, and does not sleep in bed but rather a recliner.  She admits to abnormal sleepiness during the day, but unsure if related to her medications or possible osa.  She often will take a nap in the afternoons, and can easily doze while trying to watch tv in the evening.  She does not drive for various reasons.  Her epworth score today is 10, and the pt tells me her weight is down 34 pounds over the last 2 years.   Preventive Screening-Counseling & Management  Alcohol-Tobacco     Smoking Status: never  Current Medications (verified): 1)  Budeprion Xl 150 Mg Xr24h-Tab (Bupropion Hcl) .... Take 1 Tablet By Mouth Once A Day 2)  Gabapentin 300 Mg Caps (Gabapentin) .... Take 1 Tab By Mouth Two Times A Day and 4 Tabs By Mouth At Bedtime 3)  Lexapro 10 Mg Tabs (Escitalopram Oxalate) .... Take 1 Tablet By Mouth Once A Day 4)  Metformin Hcl 500 Mg Tabs (Metformin Hcl) .... Take 3 Tabs By Mouth Daily 5)  Nexium 40 Mg Cpdr (Esomeprazole Magnesium) .... Take 1 Tablet By Mouth Once A Day 6)  Risperidone 1 Mg Tabs (Risperidone) .... Take 1 Tablet By Mouth Once A Day 7)  Simcor 500-40 Mg Xr24h-Tab (Niacin-Simvastatin) .... Take 1 Tablet By Mouth Once A Day  Allergies (verified): 1)  ! Codeine 2)  ! Vicodin  Past History:  Past Medical History:  UTERINE CANCER, HX OF (ICD-V10.42) ALLERGIC RHINITIS (ICD-477.9) HYPERLIPIDEMIA (ICD-272.4) DM (ICD-250.00)    Past Surgical  History: surgery in B ears- L ear d/t tumor and R ear d/t hole in ear drum tonsillectomy at age 9 hysterectomy feb 2011  Family History: Reviewed history and no changes required. asthma: mother heart disease: mother cancer: father (prostate)   Social History: Reviewed history and no changes required. Patient never smoked.  pt is single.  pt lives with her partner, Ellin Saba. pt has no children. occupation: n/aSmoking Status:  never  Review of Systems       The patient complains of weight change, tooth/dental problems, nasal congestion/difficulty breathing through nose, itching, and depression.  The patient denies shortness of breath with activity, shortness of breath at rest, productive cough, non-productive cough, coughing up blood, chest pain, irregular heartbeats, acid heartburn, indigestion, loss of appetite, abdominal pain, difficulty swallowing, sore throat, headaches, sneezing, ear ache, anxiety, hand/feet swelling, joint stiffness or pain, rash, change in color of mucus, and fever.    Vital Signs:  Patient profile:   40 year old female Height:      63 inches Weight:      238.50 pounds BMI:     42.40 O2 Sat:      99 % on Room air Temp:     98.3 degrees F oral Pulse rate:   85 / minute BP sitting:   118 / 74  (left arm) Cuff size:  large  Vitals Entered By: Arman Filter LPN (February 09, 2010 2:26 PM)  O2 Flow:  Room air CC: Sleep Consult Comments Medications reviewed with patient Arman Filter LPN  February 09, 2010 2:35 PM    Physical Exam  General:  obese female in nad Eyes:  PERRLA and EOMI.   Nose:  narrowed bilat, but patent. Mouth:  elongation of soft palate and uvula Neck:  large neck, no obvious jvd, tmg, LN Lungs:  clear to auscultation Heart:  rrr, no mrg Abdomen:  soft and nontender, bs+ Extremities:  mild ankle edema, no cyanosis pulses intact distally. Neurologic:  alert and oriented, moves all 4.   Impression &  Recommendations:  Problem # 1:  OBSTRUCTIVE SLEEP APNEA (ICD-327.23) the pt's history is very suggestive of osa.  She is obese, has a large neck, has loud snoring and pauses in breathing during sleep, nonrestorative sleep, and daytime sleepiness.  It is unclear how much of her symptoms are due to osa vs. the sedating meds that she takes.  I have had a long discussion with the pt about sleep apnea, including its impact on QOL and CV health.  At this point, I think she needs a sleep study for diagnosis.  Medications Added to Medication List This Visit: 1)  Budeprion Xl 150 Mg Xr24h-tab (Bupropion hcl) .... Take 1 tablet by mouth once a day 2)  Gabapentin 300 Mg Caps (Gabapentin) .... Take 1 tab by mouth two times a day and 4 tabs by mouth at bedtime 3)  Lexapro 10 Mg Tabs (Escitalopram oxalate) .... Take 1 tablet by mouth once a day 4)  Metformin Hcl 500 Mg Tabs (Metformin hcl) .... Take 3 tabs by mouth daily 5)  Nexium 40 Mg Cpdr (Esomeprazole magnesium) .... Take 1 tablet by mouth once a day 6)  Risperidone 1 Mg Tabs (Risperidone) .... Take 1 tablet by mouth once a day 7)  Simcor 500-40 Mg Xr24h-tab (Niacin-simvastatin) .... Take 1 tablet by mouth once a day  Other Orders: Consultation Level IV (04540) Sleep Disorder Referral (Sleep Disorder)  Patient Instructions: 1)  will set up for sleep study, and arrange for followup with me when results are available. 2)  work on weight loss

## 2010-07-19 NOTE — Assessment & Plan Note (Signed)
Summary: rov for osa   Visit Type:  Follow-up Copy to:  Harwani Primary Provider/Referring Provider:  Spruill  CC:  4 week follow up. pt states she wears cpap eveyrnight x5-7 hrs a night. Pt states she has air seeping through her mask. [name]  History of Present Illness: the pt comes in today for f/u of her known osa.  She was started on cpap last visit, and has done well with the device.  She is sleeping better, and has improved daytime alertness.  She has not had pressure issues, and her mask tolerance overall is adequate.    Current Medications (verified): 1)  Budeprion Xl 150 Mg Xr24h-Tab (Bupropion Hcl) .... Take 1 Tablet By Mouth Once A Day 2)  Gabapentin 300 Mg Caps (Gabapentin) .... Take 1 Tab By Mouth Two Times A Day and 4 Tabs By Mouth At Bedtime 3)  Lexapro 20 Mg Tabs (Escitalopram Oxalate) .... Once Daily 4)  Metformin Hcl 500 Mg Tabs (Metformin Hcl) .... Take 3 Tabs By Mouth Daily 5)  Nexium 40 Mg Cpdr (Esomeprazole Magnesium) .... Take 1 Tablet By Mouth Once A Day 6)  Risperidone 1 Mg Tabs (Risperidone) .... Take 1 Tablet By Mouth Once A Day 7)  Simcor 500-40 Mg Xr24h-Tab (Niacin-Simvastatin) .... Take 1 Tablet By Mouth Once A Day  Allergies (verified): 1)  ! Codeine 2)  ! Vicodin  Review of Systems  The patient denies shortness of breath with activity, shortness of breath at rest, productive cough, non-productive cough, coughing up blood, chest pain, irregular heartbeats, acid heartburn, indigestion, loss of appetite, weight change, abdominal pain, difficulty swallowing, sore throat, tooth/dental problems, headaches, nasal congestion/difficulty breathing through nose, sneezing, itching, ear ache, anxiety, depression, hand/feet swelling, joint stiffness or pain, rash, change in color of mucus, and fever.    Vital Signs:  Patient profile:   40 year old female Height:      63 inches Weight:      245.38 pounds BMI:     43.62 O2 Sat:      98 % on Room air Temp:     98.7  degrees F oral Pulse rate:   80 / minute BP sitting:   118 / 64  (left arm) Cuff size:   large  Vitals Entered By: Carver Fila (April 27, 2010 9:23 AM)  O2 Flow:  Room air CC: 4 week follow up. pt states she wears cpap eveyrnight x5-7 hrs a night. Pt states she has air seeping through her mask.  Comments meds and allergies updated Phone number updated Mindy Edward Jolly  April 27, 2010 9:22 AM    Physical Exam  General:  obese female in nad Nose:  no skin breakdown or pressure necrosis from cpap mask Mouth:  clear, no exudates or other lesions seen. Extremities:  no significant edema, no cyanosis  Neurologic:  alert, does not appear sleepy, moves all 4.   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  She has seen a difference in her sleep and daytime alertness, and her partner has heard very little breakthru snoring.  She is having a little bit of mask leaking, and will have dme make some adjustments.  We also need to optimize her pressure for her, and I have encouraged her to work aggressively on weight loss. Care Plan:  At this point, will arrange for the patient's machine to  be changed over to auto mode for 2 weeks to optimize their pressure.  I will review the downloaded data once sent by dme, and also evaluate for compliance, leaks, and residual osa.  I will call the patient and dme to discuss the results, and have the patient's machine set appropriately.  This will serve as the pt's cpap pressure titration.  Medications Added to Medication List This Visit: 1)  Lexapro 20 Mg Tabs (Escitalopram oxalate) .... Once daily  Other Orders: DME Referral (DME) Est. Patient Level III (16606)  Patient Instructions: 1)  will have machine changed over to auto mode for the next 2 weeks to optimize pressure.  Will let you know the results. 2)  work on weight  loss 3)  will get dme to check your mask fit. 4)  followup with me in 6mos.

## 2010-07-19 NOTE — Assessment & Plan Note (Signed)
Summary: ov to discuss sleep study results/mg   Visit Type:  Follow-up Copy to:  Harwani Primary Provider/Referring Provider:  Spruill  CC:  pt here to discuss sleep study results..  History of Present Illness: the pt comes in today for f/u of her recent sleep study.  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  I have reviewed the study in detail with the pt, and answered all of her questions.    Current Medications (verified): 1)  Budeprion Xl 150 Mg Xr24h-Tab (Bupropion Hcl) .... Take 1 Tablet By Mouth Once A Day 2)  Gabapentin 300 Mg Caps (Gabapentin) .... Take 1 Tab By Mouth Two Times A Day and 4 Tabs By Mouth At Bedtime 3)  Lexapro 10 Mg Tabs (Escitalopram Oxalate) .... Take 1 Tablet By Mouth Once A Day 4)  Metformin Hcl 500 Mg Tabs (Metformin Hcl) .... Take 3 Tabs By Mouth Daily 5)  Nexium 40 Mg Cpdr (Esomeprazole Magnesium) .... Take 1 Tablet By Mouth Once A Day 6)  Risperidone 1 Mg Tabs (Risperidone) .... Take 1 Tablet By Mouth Once A Day 7)  Simcor 500-40 Mg Xr24h-Tab (Niacin-Simvastatin) .... Take 1 Tablet By Mouth Once A Day  Allergies (verified): 1)  ! Codeine 2)  ! Vicodin  Past History:  Past medical, surgical, family and social histories (including risk factors) reviewed, and no changes noted (except as noted below).  Past Medical History: Reviewed history from 02/09/2010 and no changes required.  UTERINE CANCER, HX OF (ICD-V10.42) ALLERGIC RHINITIS (ICD-477.9) HYPERLIPIDEMIA (ICD-272.4) DM (ICD-250.00)    Past Surgical History: Reviewed history from 02/09/2010 and no changes required. surgery in B ears- L ear d/t tumor and R ear d/t hole in ear drum tonsillectomy at age 34 hysterectomy feb 2011  Family History: Reviewed history from 02/09/2010 and no changes required. asthma: mother heart disease: mother cancer: father (prostate)   Social History: Reviewed history from 02/09/2010 and no changes required. Patient never  smoked.  pt is single.  pt lives with her partner, Ellin Saba. pt has no children. occupation: n/a  Review of Systems       The patient complains of hand/feet swelling.  The patient denies shortness of breath with activity, shortness of breath at rest, productive cough, non-productive cough, coughing up blood, chest pain, irregular heartbeats, acid heartburn, indigestion, loss of appetite, weight change, abdominal pain, difficulty swallowing, sore throat, tooth/dental problems, headaches, nasal congestion/difficulty breathing through nose, sneezing, itching, ear ache, anxiety, depression, joint stiffness or pain, rash, change in color of mucus, and fever.    Vital Signs:  Patient profile:   40 year old female Height:      63 inches Weight:      246.50 pounds BMI:     43.82 O2 Sat:      98 % on Room air Temp:     98 degrees F oral Pulse rate:   109 / minute BP sitting:   126 / 78  (left arm) Cuff size:   large  Vitals Entered By: Carver Fila (March 30, 2010 9:04 AM)  O2 Flow:  Room air CC: pt here to discuss sleep study results. Comments meds and allergies updated Phone number updated Carver Fila  March 30, 2010 9:05 AM    Physical Exam  General:  obese female in nad Extremities:  mild edema, but no cyanosis  Neurologic:  alert and oriented, but does appear mildly sleepy moves all 4 extrem.   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  I have reviewed the various treatment options with her, but have explained that cpap and weight loss represent her best chance of improvement.  She is willing to give this a try.  I will set the patient up on cpap at a moderate pressure level to allow for desensitization, and will troubleshoot the device over the next 4-6weeks if needed.  The pt is to call me if having issues with tolerance.  Will then optimize the pressure  once patient is able to wear cpap on a consistent basis.  Other Orders: Est. Patient Level III (16109) DME Referral (DME)  Patient Instructions: 1)  will start on cpap at moderate pressure level.  Please call if having tolerance issues. 2)  work on weight loss 3)  followup with me in 4weeks.

## 2010-09-01 ENCOUNTER — Ambulatory Visit: Payer: Medicaid Other | Attending: Gynecologic Oncology | Admitting: Gynecologic Oncology

## 2010-09-01 DIAGNOSIS — C549 Malignant neoplasm of corpus uteri, unspecified: Secondary | ICD-10-CM | POA: Insufficient documentation

## 2010-09-01 DIAGNOSIS — E119 Type 2 diabetes mellitus without complications: Secondary | ICD-10-CM | POA: Insufficient documentation

## 2010-09-01 DIAGNOSIS — E785 Hyperlipidemia, unspecified: Secondary | ICD-10-CM | POA: Insufficient documentation

## 2010-09-08 LAB — CBC
HCT: 29.3 % — ABNORMAL LOW (ref 36.0–46.0)
Hemoglobin: 8.9 g/dL — ABNORMAL LOW (ref 12.0–15.0)
MCHC: 30.4 g/dL (ref 30.0–36.0)
MCV: 68 fL — ABNORMAL LOW (ref 78.0–100.0)
Platelets: 283 10*3/uL (ref 150–400)
RBC: 4.31 MIL/uL (ref 3.87–5.11)
RDW: 25.7 % — ABNORMAL HIGH (ref 11.5–15.5)
WBC: 10.2 10*3/uL (ref 4.0–10.5)

## 2010-09-08 LAB — URINALYSIS, ROUTINE W REFLEX MICROSCOPIC
Glucose, UA: NEGATIVE mg/dL
Nitrite: NEGATIVE
Protein, ur: 100 mg/dL — AB
Specific Gravity, Urine: 1.038 — ABNORMAL HIGH (ref 1.005–1.030)
Urobilinogen, UA: 1 mg/dL (ref 0.0–1.0)
pH: 6 (ref 5.0–8.0)

## 2010-09-08 LAB — DIFFERENTIAL
Basophils Absolute: 0.1 10*3/uL (ref 0.0–0.1)
Basophils Relative: 1 % (ref 0–1)
Eosinophils Absolute: 0.1 10*3/uL (ref 0.0–0.7)
Eosinophils Relative: 1 % (ref 0–5)
Lymphocytes Relative: 10 % — ABNORMAL LOW (ref 12–46)
Lymphs Abs: 1 10*3/uL (ref 0.7–4.0)
Monocytes Absolute: 0.8 10*3/uL (ref 0.1–1.0)
Monocytes Relative: 8 % (ref 3–12)
Neutro Abs: 8.2 10*3/uL — ABNORMAL HIGH (ref 1.7–7.7)
Neutrophils Relative %: 80 % — ABNORMAL HIGH (ref 43–77)

## 2010-09-08 LAB — POCT I-STAT, CHEM 8
BUN: 9 mg/dL (ref 6–23)
Calcium, Ion: 1.09 mmol/L — ABNORMAL LOW (ref 1.12–1.32)
Chloride: 106 mEq/L (ref 96–112)
Creatinine, Ser: 0.7 mg/dL (ref 0.4–1.2)
Glucose, Bld: 126 mg/dL — ABNORMAL HIGH (ref 70–99)
HCT: 30 % — ABNORMAL LOW (ref 36.0–46.0)
Hemoglobin: 10.2 g/dL — ABNORMAL LOW (ref 12.0–15.0)
Potassium: 3.5 mEq/L (ref 3.5–5.1)
Sodium: 138 mEq/L (ref 135–145)
TCO2: 25 mmol/L (ref 0–100)

## 2010-09-08 LAB — URINE MICROSCOPIC-ADD ON

## 2010-09-09 NOTE — Consult Note (Signed)
  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  HISTORY OF PRESENT ILLNESS:  This is a 40 year old who underwent a robotic hysterectomy, bilateral salpingo-oophorectomy, bilateral pelvic and periaortic lymph nodes.  Final pathology was notable for grade 1 endometrial cancer invading up to 90% of the uterus in the area of the fundus.  There was no lymphovascular space invasion.  No evidence of metastatic disease to the adnexa.  She received adjuvant pelvic radiotherapy.  Postoperative course was complicated by an abdominal wall hematoma and residual abdominal pain.  Rhonda Higgins states that she is doing very well at this time.  She reports hot flashes approximately 4 times per day.  Denies awakening.  She uses her dilator almost every other day.  Denies diarrhea or constipation.  Reports weight gain. Denies cough or headache.  PAST MEDICAL HISTORY: 1. Personality disorder. 2. Obesity. 3. Diabetes. 4. Hyperlipidemia. 5. Endometrial cancer.  PAST SURGICAL HISTORY: 1. Tonsillectomy and adenoidectomy. 2. Left uterine surgery. 3. Robotic hysterectomy and bilateral salpingo-oophorectomy. 4. Bilateral pelvic periaortic lymph node dissection.  SOCIAL HISTORY:  She resides with her partners, 3 cats and 2 dogs.  She does not work outside the home.  She denies tobacco use.  REVIEW OF SYSTEMS:  A 10-point review of systems as noted above.  PHYSICAL EXAMINATION:  VITAL SIGNS:  Weight 246 pounds, blood pressure 130/74, pulse of 82. GENERAL:  A well-developed female in no acute distress. CHEST:  Clear to  auscultation. HEART:  Regular rate and rhythm. ABDOMEN:  Soft, obese.  No evidence of hernia at port sites. EXTREMITIES:  No clubbing, cyanosis, or edema. BACK:  No CVA tenderness. PELVIC:  Normal external genitalia, Bartholin's, urethra, and Skene's. Narrow vagina.  However, no lesions or nodules palpated. RECTAL:  Good anal sphincter tone without any of palpable cul-de-sac masses.  IMPRESSION:  Rhonda Higgins is without any evidence of disease from her stage IB grade 1 endometrial adenocarcinoma.  Her Pap test in December 2011 by Dr. Roselind Messier was within normal limits.  She will follow up with Dr. Roselind Messier in July 2012 and with Dr. Osborn Coho in September 2012 and with GYN Oncology in December 2012.  All of her questions were answered regarding the surveillance.     Laurette Schimke, MD     WB/MEDQ  D:  09/01/2010  T:  09/01/2010  Job:  [id number]  cc:   Osborn Coho, M.D. Fax: [phone]  Telford Nab, R.N. [address] [place], [place] [address]  Billie Lade, Ph.D., M.D. Fax: [phone]  Electronically Signed by Laurette Schimke MD on 09/05/2010 10:25:57 AM

## 2010-09-12 LAB — URINALYSIS, ROUTINE W REFLEX MICROSCOPIC
Bilirubin Urine: NEGATIVE
Glucose, UA: NEGATIVE mg/dL
Hgb urine dipstick: NEGATIVE
Ketones, ur: NEGATIVE mg/dL
Nitrite: NEGATIVE
Protein, ur: NEGATIVE mg/dL
Specific Gravity, Urine: 1.024 (ref 1.005–1.030)
Urobilinogen, UA: 1 mg/dL (ref 0.0–1.0)
pH: 6.5 (ref 5.0–8.0)

## 2010-09-12 LAB — URINE CULTURE: Colony Count: 4000

## 2010-09-12 LAB — URINE MICROSCOPIC-ADD ON

## 2010-10-21 ENCOUNTER — Encounter: Payer: Self-pay | Admitting: Pulmonary Disease

## 2010-10-26 ENCOUNTER — Ambulatory Visit (INDEPENDENT_AMBULATORY_CARE_PROVIDER_SITE_OTHER): Payer: Medicaid Other | Admitting: Pulmonary Disease

## 2010-10-26 ENCOUNTER — Encounter: Payer: Self-pay | Admitting: Pulmonary Disease

## 2010-10-26 VITALS — BP 104/70 | HR 84 | Temp 98.6°F | Ht 63.0 in | Wt 247.4 lb

## 2010-10-26 DIAGNOSIS — G4733 Obstructive sleep apnea (adult) (pediatric): Secondary | ICD-10-CM

## 2010-10-26 NOTE — Progress Notes (Signed)
  Subjective:    Patient ID: Rhonda Higgins, female    DOB: 04/04/1971, 40 y.o.   MRN: [id number]  HPI The pt comes in today for f/u of her osa.  She is doing well with the device, and feels she is sleeping well with excellent daytime alertness.  She denies any issue with pressure, but likes the auto mode best.  She is having some issues with skin changes from mask, but is working with a dermatologist.     Review of Systems  Constitutional: Negative for fever and unexpected weight change.  HENT: Positive for congestion and sinus pressure. Negative for ear pain, nosebleeds, sore throat, rhinorrhea, sneezing, trouble swallowing, dental problem and postnasal drip.   Eyes: Negative for redness and itching.  Respiratory: Negative for cough, chest tightness, shortness of breath and wheezing.   Cardiovascular: Negative for palpitations and leg swelling.  Gastrointestinal: Negative for nausea and vomiting.  Genitourinary: Negative for dysuria.  Musculoskeletal: Negative for joint swelling.  Skin: Negative for rash.  Neurological: Negative for headaches.  Hematological: Does not bruise/bleed easily.  Psychiatric/Behavioral: Negative for dysphoric mood. The patient is not nervous/anxious.        Objective:   Physical Exam Obese female in nad No skin breakdown or pressure necrosis from cpap mask LE without edema, no cyanosis  Alert, does not appear sleepy, moves all extrem        Assessment & Plan:

## 2010-10-26 NOTE — Patient Instructions (Signed)
Will leave cpap on auto mode See if your dme has a mask with a more "permanent" cushion to see if it affects your skin less Work on weight loss. followup with me in one year, but call if having issues with cpap.

## 2010-10-26 NOTE — Assessment & Plan Note (Signed)
The pt is doing well with cpap, and feels it helps her with sleep and daytime alertness.  She is working on weight loss as well.  I have asked her to continue with treatment, and to call us if any issues that interfere with cpap use.  She will f/u in one year.

## 2010-11-04 NOTE — Op Note (Signed)
[place]. [facility]  Patient:    Rhonda Higgins, Rhonda Higgins                  MRN: [id number] Proc. Date: 09/13/00 Adm. 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  OPERATION:  Left myringectomy with removal middle ear cholesteatoma, type I fascia tympanoplasty.  SURGEON:  Robert L. Lyman Bishop, M.D.  ANESTHESIA:  General.  INDICATION FOR PROCEDURE:  This patient had had a history of long standing drainage from the leftear with conductive hearing loss and chronic ear infections in early childhood.  The patient elsewhere had had a right tympanoplasty with incus rotation.  Examination in the office showed an intact right ear drum with transposed incus.  No middle ear fluid on the left. Initially there was purulent exudate and what appeared to be an attic defect anteriorly.  I could see a whitish mass in the middle ear space behind the ear drum inferiorly.  After treatment with Cortisporin drops the attic defect expanded into a large perforation of the anterior superior half of the left ear drum with persistence of the whitish mass behind the ear drum inferiorly. CT scan of the mastoid indicating middle ear mass but no attic erosion posteriorly.  There was some scattered opacifications of the mastoid air cells.  Audiogram showed a conductive hearing loss in the left ear.  The patient is admitted for surgery.  DESCRIPTION OF PROCEDURE:  After satisfactory general endotracheal anesthesia had been induced, 2% xylocaine containing 1:50,000 epinephrine was infiltrated postauricularly and in the external ear canal for hemostasis. The ear and surrounding area were prepped with Betadine and sterile drapes  applied. Examination showed large perforation involving the anterior superior half of the left ear drum with a whitish mass behind the ear drum inferiorly. Vertical incision was made in the ear canal at 12 and 6:00 and joined posteriorly at the bony cartilaginous junction after which postauricular incision was made.  A large piece of temporalis fascia taken to use as a graft.  The posterior canal skin flap was elevated and retracted out of the way with a self retainingRichardson retractor.  This afforded good exposure of the external ear canal and drum area.  The medial portion of the canal skin flap posteriorly was then elevated down to the fibrous annulus which was lifted from thebony sulcus and the posterior middle ear space entered.  The ossicular chain was noted to be intact to mobile. There was some retraction of the handle of the malleus.  A thin wall cholesteatoma sac was found filling the lower half of the middle ear space. I felt that this could not be adequately removed from the ear drum so a myringectomy was carried out removing the entire anterior portion of the ear drum and most of the ear drum inthe posterior inferior quadrant, leaving the fibrous annulus intact.  Also arising from the medial wall of the anterior middle ear space at the junction of the anterior superior quadrants was a round encapsulated inflammatory appearing mass that was removed.  Soft eroded bone directly under that was removed with the curet and both submitted for examination by pathology.  Also there was no  evidence of cholesteatoma extending into the attic either anteriorly or posteriorly and to ensure that it did not extend posteriorly a shallow anterior antroatticotomy was carried out with curet and I could see that the attic area was clear.  This end piece of middle ear silastic sheeting was placed over the medial wall of the middle ear space, passed under the handle of the malleus.  The middle ear  space was then packed with pledgets of Gelfoam saturated with Cortisporin suspensionand the temporalis fascia graft was then placed over the defect, under the margins of the drum anteriorly and inferiorly, passed under the handleof the malleus and extended back to the posterior margin of the middle ear space.  The posterior canal skin flap and ear drum which had been elevated were replaced in anatomic position over the graft posteriorly.  The middle ear space then packed with pledgets of Gelfoam saturated with Cortisporin suspension with an Iodo wick placed in the lateral half of the ear canal.  The posterior auricular incision was closed in layers with interrupted 4-0 chromic gut with running 5-0 Nylon at the skin.  A sterile dressing was applied.  Estimated blood loss was 5 to 10 cc.  The patient tolerated the procedure well and was awakened from anesthesia and taken to the recovery room in satisfactory condition. DD:  09/13/00 TD:  09/13/00 Job: [id number]

## 2010-11-28 ENCOUNTER — Ambulatory Visit
Admission: RE | Admit: 2010-11-28 | Discharge: 2010-11-28 | Disposition: A | Discharge status: Home / Self Care | Payer: Medicaid Other | Source: Ambulatory Visit | Attending: Radiation Oncology | Admitting: Radiation Oncology

## 2010-11-28 ENCOUNTER — Other Ambulatory Visit (HOSPITAL_COMMUNITY)
Admission: RE | Admit: 2010-11-28 | Discharge: 2010-11-28 | Disposition: A | Discharge status: Home / Self Care | Payer: Medicaid Other | Source: Ambulatory Visit | Attending: Radiation Oncology | Admitting: Radiation Oncology

## 2010-11-28 ENCOUNTER — Other Ambulatory Visit: Payer: Self-pay | Admitting: Radiation Oncology

## 2010-11-28 DIAGNOSIS — C549 Malignant neoplasm of corpus uteri, unspecified: Secondary | ICD-10-CM | POA: Insufficient documentation

## 2011-05-09 ENCOUNTER — Other Ambulatory Visit: Payer: Self-pay | Admitting: Cardiology

## 2011-05-09 ENCOUNTER — Encounter: Payer: Self-pay | Admitting: Gynecologic Oncology

## 2011-05-09 ENCOUNTER — Ambulatory Visit: Payer: Medicaid Other | Attending: Gynecology | Admitting: Gynecology

## 2011-05-09 ENCOUNTER — Encounter: Payer: Self-pay | Admitting: Gynecology

## 2011-05-09 VITALS — BP 136/82 | HR 72 | Temp 97.9°F | Resp 18 | Ht 63.0 in | Wt 242.2 lb

## 2011-05-09 DIAGNOSIS — N939 Abnormal uterine and vaginal bleeding, unspecified: Secondary | ICD-10-CM

## 2011-05-09 DIAGNOSIS — N898 Other specified noninflammatory disorders of vagina: Secondary | ICD-10-CM | POA: Insufficient documentation

## 2011-05-09 DIAGNOSIS — K5900 Constipation, unspecified: Secondary | ICD-10-CM | POA: Insufficient documentation

## 2011-05-09 DIAGNOSIS — Z90710 Acquired absence of both cervix and uterus: Secondary | ICD-10-CM | POA: Insufficient documentation

## 2011-05-09 NOTE — Patient Instructions (Signed)
Return as scheduled or prn bleeding or other symptoms

## 2011-05-09 NOTE — Progress Notes (Signed)
The patient presents today having had one episode of a small amount of vaginal bleeding several days ago. The bleeding lasted for only one occasion and was not associated with any trauma or any other causes that she can identify. She did not have any pain. She denies any GU symptoms although she does note that she is constipated and has a bowel movement approximately every third day.  Her functional status is excellent  Review of systems: 10 point comprehensive review of systems is negative except as noted above.  Physical exam:  In general the patient is a healthy white female no acute distress  HEENT is negative  Neck is supple without thyromegaly  The abdomen is obese soft nontender no masses, organomegaly, ascites or hernias are noted.  Pelvic exam: EGBUS is normal, the vagina is normal and the vaginal cuff is well-healed; no lesions are noted, there is no blood in the vagina.  Bimanual exam reveals no masses or nodularity. The cervix and uterus are surgically absent. Adnexa are without masses.  Lower extremities are without edema or varicosities  Impression: Patient has a history of one episode of vaginal bleeding. On today's exam I can find no explanation. She is instructed contact us if she has any further bleeding.  Pap smears obtained today  She returned to see Dr. Curly Shores as previously scheduled.

## 2011-05-10 ENCOUNTER — Other Ambulatory Visit: Payer: Self-pay | Admitting: *Deleted

## 2011-05-10 ENCOUNTER — Other Ambulatory Visit (HOSPITAL_COMMUNITY)
Admission: RE | Admit: 2011-05-10 | Discharge: 2011-05-10 | Disposition: A | Discharge status: Home / Self Care | Payer: Medicaid Other | Source: Ambulatory Visit | Attending: Gynecology | Admitting: Gynecology

## 2011-05-10 DIAGNOSIS — Z01419 Encounter for gynecological examination (general) (routine) without abnormal findings: Secondary | ICD-10-CM | POA: Insufficient documentation

## 2011-05-10 DIAGNOSIS — C541 Malignant neoplasm of endometrium: Secondary | ICD-10-CM

## 2011-05-18 ENCOUNTER — Telehealth: Payer: Self-pay | Admitting: *Deleted

## 2011-05-18 NOTE — Telephone Encounter (Signed)
Pt notified pap was without evidence of cancer; it was normal

## 2011-06-21 ENCOUNTER — Encounter: Payer: Self-pay | Admitting: Gynecologic Oncology

## 2011-06-22 ENCOUNTER — Ambulatory Visit: Payer: Medicaid Other | Attending: Gynecologic Oncology | Admitting: Gynecologic Oncology

## 2011-06-22 ENCOUNTER — Encounter: Payer: Self-pay | Admitting: Gynecologic Oncology

## 2011-06-22 VITALS — BP 110/68 | HR 64 | Temp 97.9°F | Resp 16 | Ht 67.21 in | Wt 243.1 lb

## 2011-06-22 DIAGNOSIS — C549 Malignant neoplasm of corpus uteri, unspecified: Secondary | ICD-10-CM | POA: Insufficient documentation

## 2011-06-22 DIAGNOSIS — E119 Type 2 diabetes mellitus without complications: Secondary | ICD-10-CM | POA: Insufficient documentation

## 2011-06-22 DIAGNOSIS — Z79899 Other long term (current) drug therapy: Secondary | ICD-10-CM | POA: Insufficient documentation

## 2011-06-22 DIAGNOSIS — E785 Hyperlipidemia, unspecified: Secondary | ICD-10-CM | POA: Insufficient documentation

## 2011-06-22 DIAGNOSIS — C55 Malignant neoplasm of uterus, part unspecified: Secondary | ICD-10-CM

## 2011-06-22 DIAGNOSIS — Z7982 Long term (current) use of aspirin: Secondary | ICD-10-CM | POA: Insufficient documentation

## 2011-06-22 DIAGNOSIS — Z90710 Acquired absence of both cervix and uterus: Secondary | ICD-10-CM | POA: Insufficient documentation

## 2011-06-22 DIAGNOSIS — Z9079 Acquired absence of other genital organ(s): Secondary | ICD-10-CM | POA: Insufficient documentation

## 2011-06-22 NOTE — Patient Instructions (Signed)
  Followup with Dr. Trenton Founds in 3 months.  Followup with Dr. Su Hilt with Pap test in 6 months.  Followup with GYN oncology in 9 months

## 2011-06-22 NOTE — Progress Notes (Signed)
Follow-up Note: Gyn-Onc   Rhonda Higgins 41 y.o. female  CC:  Chief Complaint  Patient presents with  . Endo cancer    Follow up    HPI: This is a 41 year old who underwent a  robotic hysterectomy, bilateral salpingo-oophorectomy, bilateral pelvic  and periaortic lymph nodes. Final pathology was notable for grade 1  endometrial cancer invading up to 90% of the uterus in the area of the  fundus. There was no lymphovascular space invasion. No evidence of  metastatic disease to the adnexa. She received adjuvant pelvic  radiotherapy. Postoperative course was complicated by an abdominal wall  hematoma and residual abdominal pain. [name] states that she is  doing very well at this time. She reports hot flashes approximately 4  times per day. Denies awakening. She uses her dilator almost every  other day. Denies diarrhea or constipation. Reports weight gain.  Denies cough or headache.    Interval History: The patient presented on 05/09/2011 with one day of vaginal bleeding. Examination was unremarkable for the presence of any lesions. There has been no additional interim bleeding.   Performance status: 0  Current Meds:  Outpatient Encounter Prescriptions as of 06/22/2011  Medication Sig Dispense Refill  . aspirin 81 MG tablet Take 81 mg by mouth daily.        [name] buPROPion (WELLBUTRIN XL) 150 MG 24 hr tablet Take 150 mg by mouth daily.        . cetirizine (ZYRTEC) 10 MG tablet Take 10 mg by mouth daily.        [name] escitalopram (LEXAPRO) 20 MG tablet Take 20 mg by mouth daily.        [name] esomeprazole (NEXIUM) 40 MG capsule Take 40 mg by mouth daily.        [name] ibuprofen (ADVIL,MOTRIN) 200 MG tablet Take 200 mg by mouth every 6 (six) hours as needed.        . LamoTRIgine (LAMICTAL XR) 200 MG TB24 Take 200 mg by mouth daily.        . LamoTRIgine (LAMICTAL XR) 50 MG TB24 Take 2 tablets by mouth daily.       . Niacin-Simvastatin [facility]) 500-40 MG TB24 Take 1 tablet by mouth daily.         . risperiDONE (RISPERDAL) 1 MG tablet Take 1.5 mg by mouth daily.       . solifenacin (VESICARE) 5 MG tablet Take 5 mg by mouth daily.          Allergy:  Allergies  Allergen Reactions  . Hydrocodone-Acetaminophen Shortness Of Breath  . Codeine Itching    Social Hx:   History   Social History  . Marital Status: Single    Spouse Name: N/A    Number of Children: N  . Years of Education: N/A   Occupational History  . n/a    Social History Main Topics  . Smoking status: Never Smoker   . Smokeless tobacco: Not on file  . Alcohol Use: No  . Drug Use: No  . Sexually Active: Yes   Other Topics Concern  . Not on file   Social History Narrative   Lives with partner, Rhonda Higgins.    Past Surgical Hx:  Past Surgical History  Procedure Date  . Inner ear surgery     L ear d/t tumor and R ear d/t hole in ear drum  . Tonsillectomy     age 57  . Total abdominal hysterectomy feb 2011    BSO,  node dissection    Past Medical Hx:  Past Medical History  Diagnosis Date  . Uterine cancer   . Allergic rhinitis   . Hyperlipidemia   . Diabetes mellitus   . Personality disorder   . Overactive bladder     Family Hx:  Family History  Problem Relation Age of Onset  . Asthma Mother   . Heart disease Mother   . Diabetes Mother   . Emphysema Mother   . Prostate cancer Father   . Heart disease Sister   . Heart attack Brother     Review of Systems:  Constitutional  Feels well good appetite moderate amount of energy. Skin No rash, sores, jaundice, itching, dryness,  Cardiovascular  No chest pain, occasional shortness of breath does be evaluated by a cardiologist. Denies edema Pulmonary  No cough or wheeze.  Gastro Intestinal  No nausea, vomitting, or diarrhoea. No bright red blood per rectum, no abdominal pain, change in bowel movement, or constipation.  Genito Urinary  No frequency, urgency, dysuria, no vaginal bleeding or discharge. Denies incontinence Musculo  Skeletal  No myalgia, arthralgia, joint swelling or pain  Neurologic  No weakness, numbness, change in gait,  Psychology  No depression, anxiety, insomnia.     Vitals:  Blood pressure 110/68, pulse 64, temperature 97.9 F (36.6 C), temperature source Oral, resp. rate 16, height 5' 7.21" (1.707 m), weight 243 lb 1.6 oz (110.269 kg).  Physical Exam: WD female in no acute distress Neck  Supple without any enlargements.  Lymph node survey. No cervical supraclavicular cervical or inguinal adenopathy Cardiovascular  Pulse normal rate, regularity and rhythm. S1 and S2 normal. Lungs  Clear to auscultation bilateraly, without wheezes/crackles/rhonchi. Good air movement.  Skin  No rash/lesions/breakdown  Psychiatry  Alert and oriented to person, place, and time  Back No CVA tenderness Abdomen  Normoactive bowel sounds, abdomen soft, non-tender and obese. Surgical  sites intact without evidence of hernia.  Genito Urinary  Vulva/vagina: Normal external female genitalia.  No lesions.   Bladder/urethra:  No lesions or masses  Vagina: Atrophic without any lesions masses or cul-de-sac nodularity Rectal  Good tone, no masses no cul de sac nodularity.  Extremities  No bilateral cyanosis, clubbing or edema.    Assessment/Plan:  This is a 41 y.o. year old with a stage IB grade 1 endometrioid adenocarcinoma. She underwent robotic hysterectomy bilateral salpingo-oophorectomy pelvic and para-aortic lymph node dissection and adjuvant radiotherapy. There is no evidence of disease present.  Followup with Dr. Trenton Founds in 3 months.  Followup with Dr. Su Hilt with Pap test in 6 months.  Followup with GYN oncology in 9 months   Rhonda Mikrut, MD., PhD. 06/22/2011, 3:29 PM

## 2011-06-23 ENCOUNTER — Other Ambulatory Visit: Payer: Self-pay | Admitting: Family Medicine

## 2011-06-23 DIAGNOSIS — Z1231 Encounter for screening mammogram for malignant neoplasm of breast: Secondary | ICD-10-CM

## 2011-06-27 ENCOUNTER — Ambulatory Visit: Payer: Medicaid Other | Admitting: Physical Therapy

## 2011-07-07 ENCOUNTER — Ambulatory Visit: Payer: Medicaid Other

## 2011-07-25 ENCOUNTER — Ambulatory Visit
Admission: RE | Admit: 2011-07-25 | Discharge: 2011-07-25 | Disposition: A | Discharge status: Home / Self Care | Payer: Medicaid Other | Source: Ambulatory Visit | Attending: Family Medicine | Admitting: Family Medicine

## 2011-07-25 DIAGNOSIS — Z1231 Encounter for screening mammogram for malignant neoplasm of breast: Secondary | ICD-10-CM

## 2011-08-22 ENCOUNTER — Encounter (HOSPITAL_COMMUNITY): Payer: Self-pay | Admitting: Emergency Medicine

## 2011-08-22 ENCOUNTER — Emergency Department (HOSPITAL_COMMUNITY)
Admission: EM | Admit: 2011-08-22 | Discharge: 2011-08-23 | Disposition: A | Discharge status: Home / Self Care | Payer: Medicaid Other | Attending: Emergency Medicine | Admitting: Emergency Medicine

## 2011-08-22 DIAGNOSIS — N200 Calculus of kidney: Secondary | ICD-10-CM

## 2011-08-22 DIAGNOSIS — E119 Type 2 diabetes mellitus without complications: Secondary | ICD-10-CM | POA: Insufficient documentation

## 2011-08-22 DIAGNOSIS — Z8542 Personal history of malignant neoplasm of other parts of uterus: Secondary | ICD-10-CM | POA: Insufficient documentation

## 2011-08-22 DIAGNOSIS — Z79899 Other long term (current) drug therapy: Secondary | ICD-10-CM | POA: Insufficient documentation

## 2011-08-22 DIAGNOSIS — E785 Hyperlipidemia, unspecified: Secondary | ICD-10-CM | POA: Insufficient documentation

## 2011-08-22 DIAGNOSIS — N201 Calculus of ureter: Secondary | ICD-10-CM | POA: Insufficient documentation

## 2011-08-22 DIAGNOSIS — R109 Unspecified abdominal pain: Secondary | ICD-10-CM | POA: Insufficient documentation

## 2011-08-22 DIAGNOSIS — N390 Urinary tract infection, site not specified: Secondary | ICD-10-CM | POA: Insufficient documentation

## 2011-08-22 NOTE — ED Notes (Signed)
Pt alert, nad, c/o flank pain, radiates to groin, states experiencing UTI s/s as well, resp even unlabored, skin pwd, states started new medication of over active bladder

## 2011-08-23 ENCOUNTER — Emergency Department (HOSPITAL_COMMUNITY): Payer: Medicaid Other

## 2011-08-23 LAB — CBC
HCT: 42.3 % (ref 36.0–46.0)
Hemoglobin: 13.7 g/dL (ref 12.0–15.0)
MCH: 25.2 pg — ABNORMAL LOW (ref 26.0–34.0)
MCHC: 32.4 g/dL (ref 30.0–36.0)
MCV: 77.8 fL — ABNORMAL LOW (ref 78.0–100.0)
Platelets: 167 10*3/uL (ref 150–400)
RBC: 5.44 MIL/uL — ABNORMAL HIGH (ref 3.87–5.11)
RDW: 13.9 % (ref 11.5–15.5)
WBC: 8.2 10*3/uL (ref 4.0–10.5)

## 2011-08-23 LAB — URINALYSIS, ROUTINE W REFLEX MICROSCOPIC
Bilirubin Urine: NEGATIVE
Glucose, UA: NEGATIVE mg/dL
Ketones, ur: NEGATIVE mg/dL
Leukocytes, UA: NEGATIVE
Nitrite: NEGATIVE
Protein, ur: NEGATIVE mg/dL
Specific Gravity, Urine: 1.027 (ref 1.005–1.030)
Urobilinogen, UA: 0.2 mg/dL (ref 0.0–1.0)
pH: 6.5 (ref 5.0–8.0)

## 2011-08-23 LAB — URINE MICROSCOPIC-ADD ON

## 2011-08-23 LAB — DIFFERENTIAL
Basophils Absolute: 0 10*3/uL (ref 0.0–0.1)
Basophils Relative: 0 % (ref 0–1)
Eosinophils Absolute: 0.2 10*3/uL (ref 0.0–0.7)
Eosinophils Relative: 2 % (ref 0–5)
Lymphocytes Relative: 22 % (ref 12–46)
Lymphs Abs: 1.8 10*3/uL (ref 0.7–4.0)
Monocytes Absolute: 0.6 10*3/uL (ref 0.1–1.0)
Monocytes Relative: 7 % (ref 3–12)
Neutro Abs: 5.6 10*3/uL (ref 1.7–7.7)
Neutrophils Relative %: 68 % (ref 43–77)

## 2011-08-23 LAB — POCT I-STAT, CHEM 8
BUN: 18 mg/dL (ref 6–23)
Calcium, Ion: 1.16 mmol/L (ref 1.12–1.32)
Chloride: 105 mEq/L (ref 96–112)
Creatinine, Ser: 1 mg/dL (ref 0.50–1.10)
Glucose, Bld: 117 mg/dL — ABNORMAL HIGH (ref 70–99)
HCT: 43 % (ref 36.0–46.0)
Hemoglobin: 14.6 g/dL (ref 12.0–15.0)
Potassium: 3.8 mEq/L (ref 3.5–5.1)
Sodium: 143 mEq/L (ref 135–145)
TCO2: 27 mmol/L (ref 0–100)

## 2011-08-23 IMAGING — CT CT ABD-PELV W/O CM
1 of 2 series · 13 of 25 positions shown, 19 images · non-contrast
Comparison: [DATE]

CLINICAL DATA: Left flank pain radiating to groin, UTI,
microscopic hematuria, over active bladder, past history of uterine
cancer post surgery and radiation therapy

CT ABDOMEN AND PELVIS WITHOUT CONTRAST
TECHNIQUE: Multidetector CT imaging of the abdomen and pelvis was
performed following the standard protocol without intravenous
contrast. Sagittal and coronal MPR images reconstructed from axial
data set.

[Series 4: lung · axial · 0.76mm/px · z∈[+1500,+1560]mm · 13 of 15 slices shown, 19 images]
[im 2/15  soft-tissue]
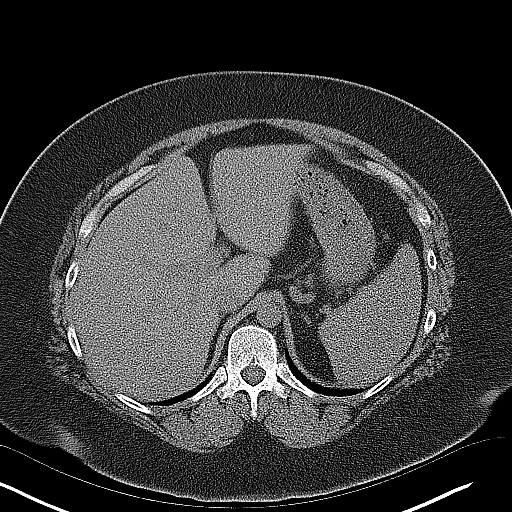
[im 2/15  bone]
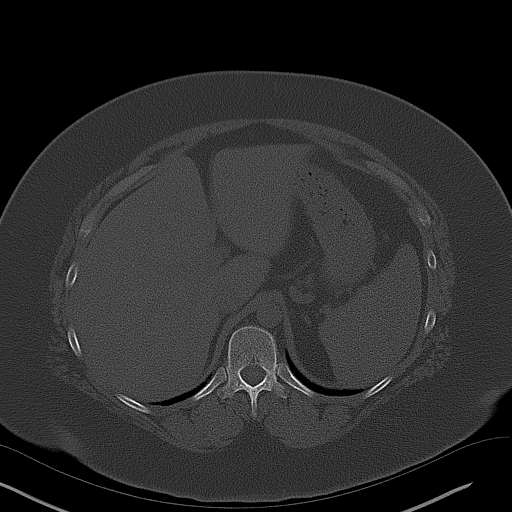
[im 3/15  soft-tissue]
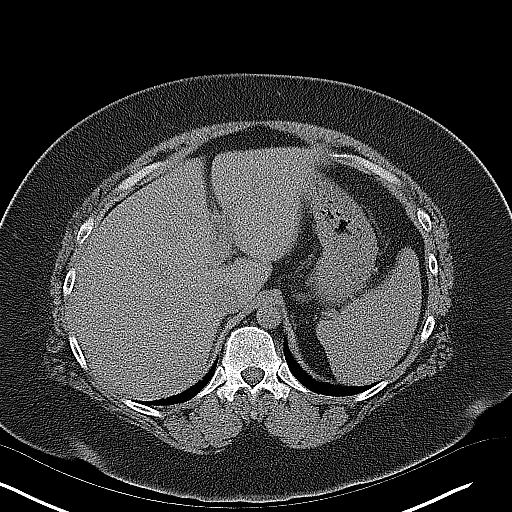
[im 4/15  soft-tissue]
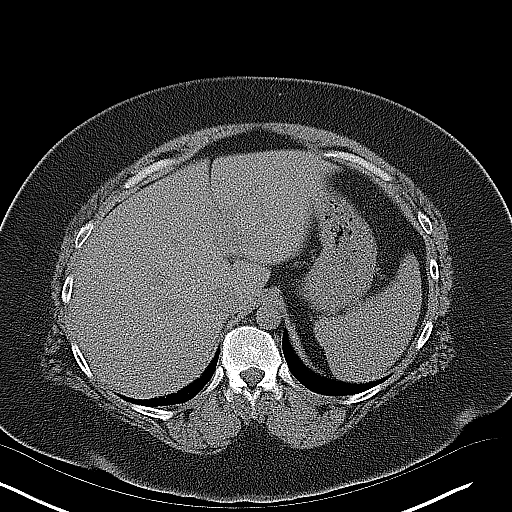
[im 5/15  soft-tissue]
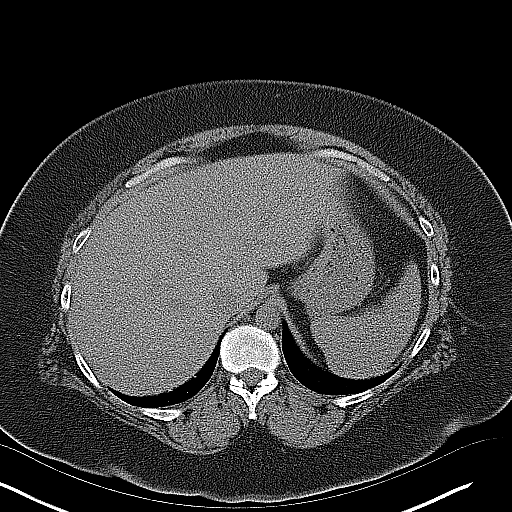
[im 6/15  soft-tissue]
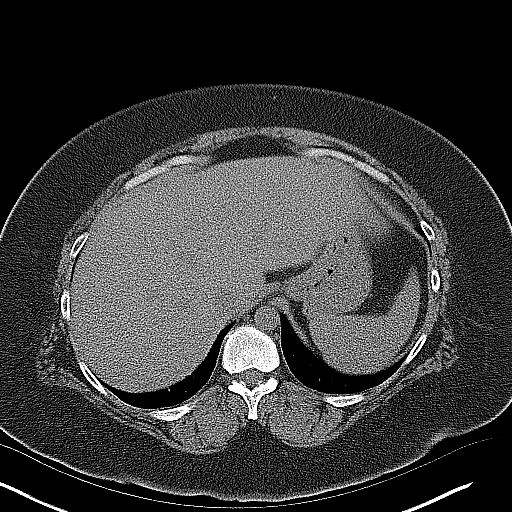
[im 7/15  soft-tissue]
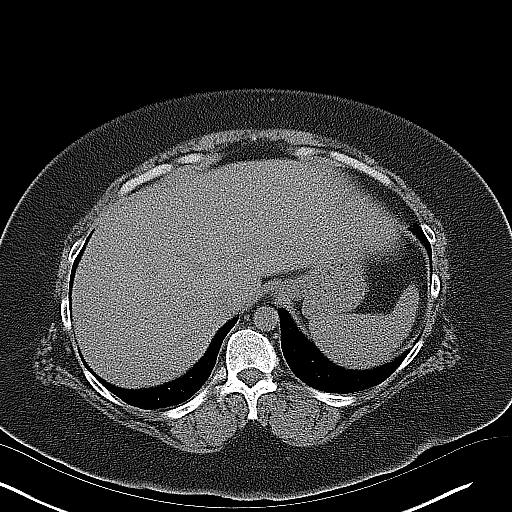
[im 8/15  soft-tissue]
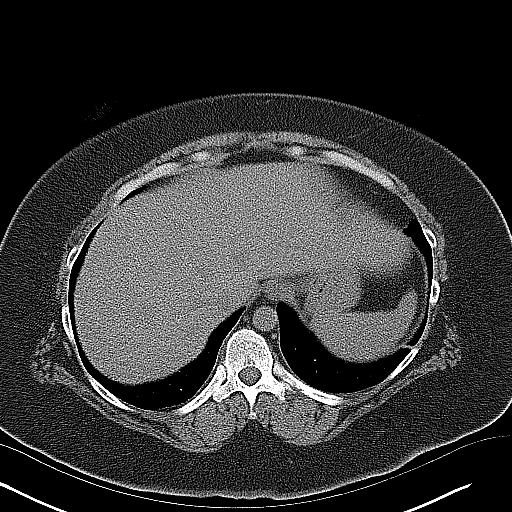
[im 9/15  soft-tissue]
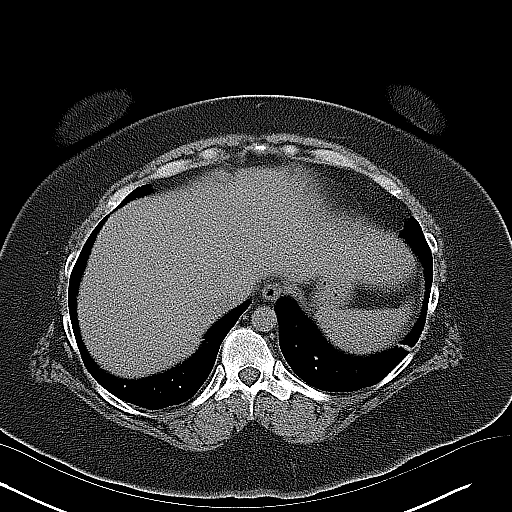
[im 10/15  soft-tissue]
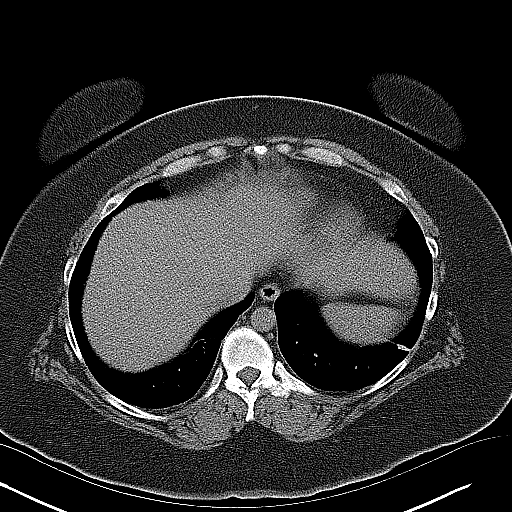
[im 10/15  bone]
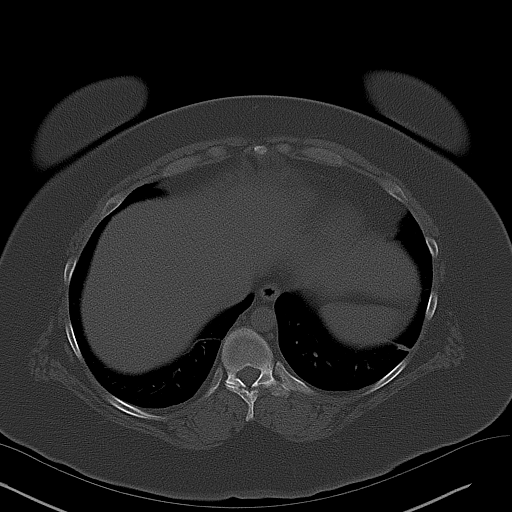
[im 11/15  soft-tissue]
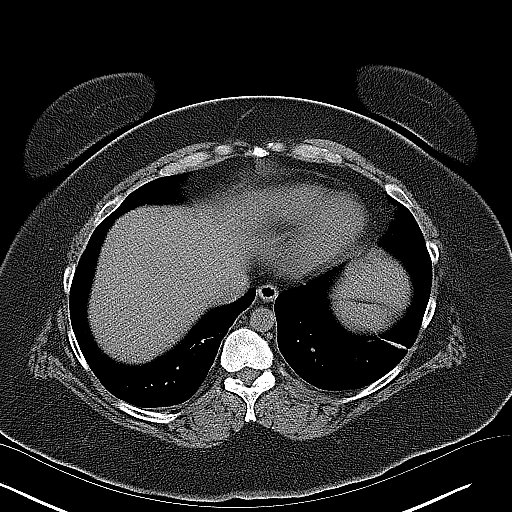
[im 11/15  lung]
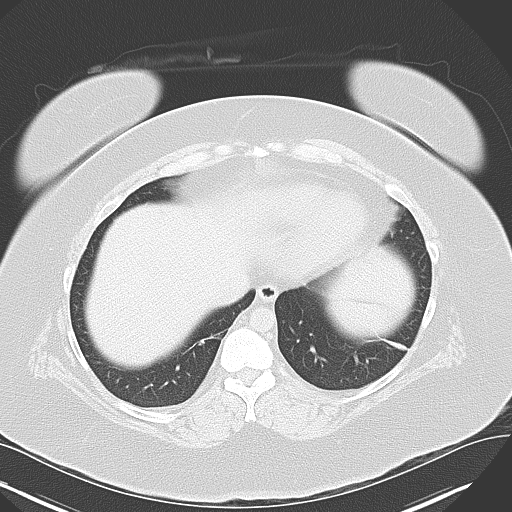
[im 12/15  soft-tissue]
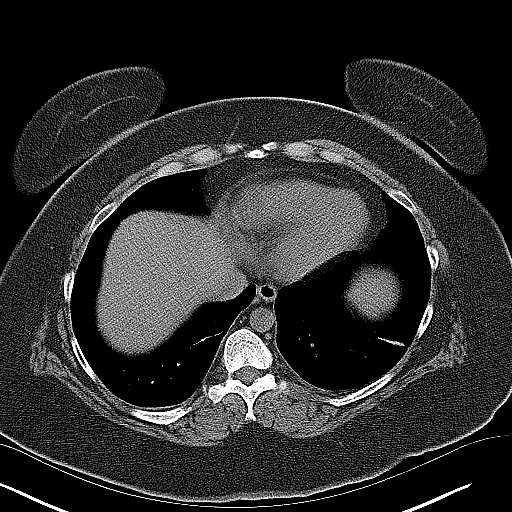
[im 12/15  lung]
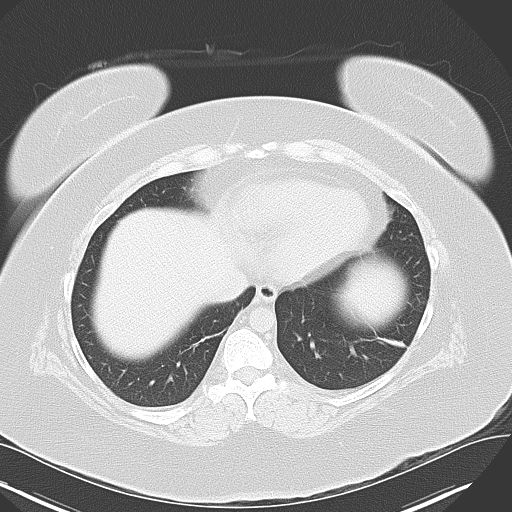
[im 13/15  soft-tissue]
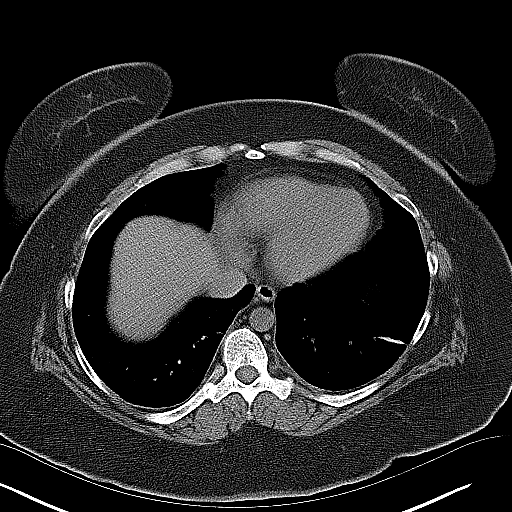
[im 13/15  lung]
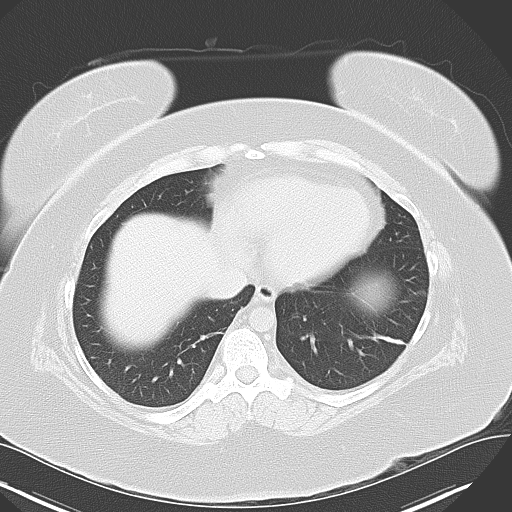
[im 14/15  soft-tissue]
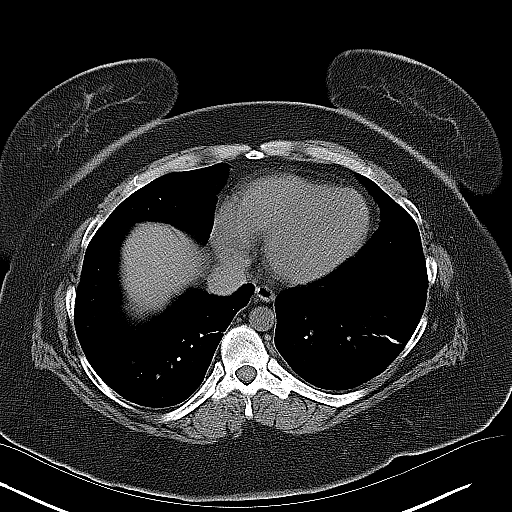
[im 14/15  lung]
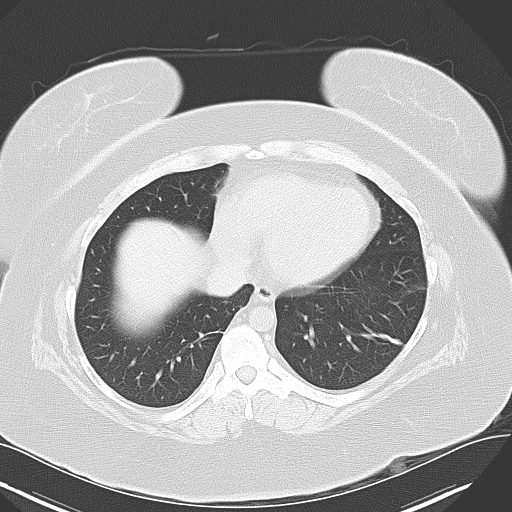

[13 of 25 positions shown; findings below may reference images not displayed]

FINDINGS: Chronic subsegmental atelectasis versus scarring left lower lobe.
Left hydronephrosis and hydroureter secondary to a 2 mm diameter
calculus at the left ureterovesicle junction.
Additional tiny bilateral nonobstructing renal calculi.
Within limits of a nonenhanced exam, no additional focal
abnormalities of the liver, spleen, pancreas, kidneys, or adrenal
glands.
Probable small splenule at splenic hilum.

Uterus surgically absent with nonvisualization of the ovaries.
Normal appendix.
Tiny umbilical hernia containing fat.
No mass, adenopathy, free fluid or inflammatory process.
Normal-sized inguinal lymph nodes bilaterally.
Question small bone island right ilium.
IMPRESSION: Left hydronephrosis and hydroureter secondary to a 2 mm diameter
left UVJ calculus.
Additional tiny bilateral nonobstructing renal calculi.
Tiny umbilical hernia.

## 2011-08-23 MED ORDER — CIPROFLOXACIN HCL 500 MG PO TABS
ORAL_TABLET | ORAL | Status: AC
  Administered 2011-08-23: 500 mg via ORAL
  Filled 2011-08-23: qty 1

## 2011-08-23 MED ORDER — NAPROXEN 500 MG PO TABS: 500.0000 mg | ORAL_TABLET | Freq: Two times a day (BID) | ORAL | Status: DC

## 2011-08-23 MED ORDER — CIPROFLOXACIN HCL 500 MG PO TABS: 500.0000 mg | ORAL_TABLET | Freq: Two times a day (BID) | ORAL | Status: AC

## 2011-08-23 MED ORDER — KETOROLAC TROMETHAMINE 60 MG/2ML IM SOLN
60.0000 mg | Freq: Once | INTRAMUSCULAR | Status: AC
  Administered 2011-08-23: 60 mg via INTRAMUSCULAR
  Filled 2011-08-23: qty 2

## 2011-08-23 MED ORDER — PROMETHAZINE HCL 25 MG RE SUPP: 25.0000 mg | Freq: Four times a day (QID) | RECTAL | Status: DC | PRN

## 2011-08-23 MED ORDER — TAMSULOSIN HCL 0.4 MG PO CAPS: 0.4000 mg | ORAL_CAPSULE | Freq: Every day | ORAL | Status: DC

## 2011-08-23 NOTE — ED Provider Notes (Signed)
History     CSN: [id number]  Arrival date & time 08/22/11  2334   First MD Initiated Contact with Patient 08/23/11 0143      Chief Complaint  Patient presents with  . Flank Pain  . Urinary Tract Infection    (Consider location/radiation/quality/duration/timing/severity/associated sxs/prior treatment) Patient is a 41 y.o. female presenting with flank pain and urinary tract infection. The history is provided by the patient. No language interpreter was used.  Flank Pain This is a new problem. The current episode started 2 days ago. The problem occurs constantly. The problem has not changed since onset.Pertinent negatives include no chest pain, no abdominal pain, no headaches and no shortness of breath. The symptoms are aggravated by nothing. The symptoms are relieved by nothing. She has tried nothing for the symptoms. The treatment provided no relief.  Urinary Tract Infection This is a recurrent problem. The current episode started yesterday. The problem occurs constantly. The problem has not changed since onset.Pertinent negatives include no chest pain, no abdominal pain, no headaches and no shortness of breath. The symptoms are aggravated by nothing. The symptoms are relieved by nothing. She has tried nothing for the symptoms. The treatment provided no relief.  Discomfort with urination but left flank pain radiating to left groin that is new.  No f/c/r.    Past Medical History  Diagnosis Date  . Uterine cancer   . Allergic rhinitis   . Hyperlipidemia   . Diabetes mellitus   . Personality disorder   . Overactive bladder     Past Surgical History  Procedure Date  . Inner ear surgery     L ear d/t tumor and R ear d/t hole in ear drum  . Tonsillectomy     age 17  . Total abdominal hysterectomy feb 2011    BSO, node dissection    Family History  Problem Relation Age of Onset  . Asthma Mother   . Heart disease Mother   . Diabetes Mother   . Emphysema Mother   . Prostate  cancer Father   . Heart disease Sister   . Heart attack Brother     History  Substance Use Topics  . Smoking status: Never Smoker   . Smokeless tobacco: Not on file  . Alcohol Use: No    OB History    Grav Para Term Preterm Abortions TAB SAB Ect Mult Living                  Review of Systems  Constitutional: Negative.   HENT: Negative.   Eyes: Negative.   Respiratory: Negative for shortness of breath.   Cardiovascular: Negative for chest pain.  Gastrointestinal: Negative for abdominal pain.  Genitourinary: Positive for urgency, frequency and flank pain.  Musculoskeletal: Negative.   Skin: Negative.   Neurological: Negative for headaches.  Hematological: Negative.   Psychiatric/Behavioral: Negative.     Allergies  Hydrocodone-acetaminophen and Codeine  Home Medications   Current Outpatient Rx  Name Route Sig Dispense Refill  . ASPIRIN 81 MG PO TABS Oral Take 81 mg by mouth daily.      . BUPROPION HCL ER (XL) 150 MG PO TB24 Oral Take 150 mg by mouth daily.      [name] CETIRIZINE HCL 10 MG PO TABS Oral Take 10 mg by mouth daily.      . IBUPROFEN 200 MG PO TABS Oral Take 200 mg by mouth every 6 (six) hours as needed.      [name] LAMOTRIGINE ER  200 MG PO TB24 Oral Take 200 mg by mouth daily.      [name] MIRABEGRON ER 25 MG PO TB24 Oral Take 50 mg by mouth daily.    [name] PANTOPRAZOLE SODIUM 40 MG PO TBEC Oral Take 40 mg by mouth daily.    [name] RISPERIDONE 1 MG PO TABS Oral Take 1.5 mg by mouth daily.     [name] SIMVASTATIN 40 MG PO TABS Oral Take 40 mg by mouth every evening.      BP 124/77  Pulse 98  Temp(Src) 98.2 F (36.8 C) (Oral)  Resp 18  SpO2 95%  Physical Exam  Constitutional: She is oriented to person, place, and time. She appears well-developed and well-nourished.  HENT:  Head: Normocephalic and atraumatic.  Mouth/Throat: Oropharynx is clear and moist.  Eyes: Conjunctivae are normal. Pupils are equal, round, and reactive to light.  Neck: Normal range of motion. Neck supple.    Cardiovascular: Normal rate and regular rhythm.   Pulmonary/Chest: Effort normal and breath sounds normal.  Abdominal: Soft. Bowel sounds are normal. There is no tenderness. There is no rebound and no guarding.  Musculoskeletal: Normal range of motion.  Neurological: She is alert and oriented to person, place, and time.  Skin: Skin is warm and dry.  Psychiatric: She has a normal mood and affect.    ED Course  Procedures (including critical care time)  Labs Reviewed  URINALYSIS, ROUTINE W REFLEX MICROSCOPIC - Abnormal; Notable for the following:    APPearance CLOUDY (*)    Hgb urine dipstick SMALL (*)    All other components within normal limits  URINE MICROSCOPIC-ADD ON - Abnormal; Notable for the following:    Bacteria, UA MANY (*)    Crystals CA OXALATE CRYSTALS (*)    All other components within normal limits  CBC - Abnormal; Notable for the following:    RBC 5.44 (*)    MCV 77.8 (*)    MCH 25.2 (*)    All other components within normal limits  POCT I-STAT, CHEM 8 - Abnormal; Notable for the following:    Glucose, Bld 117 (*)    All other components within normal limits  DIFFERENTIAL   Ct Abdomen Pelvis Wo Contrast  08/23/2011  *RADIOLOGY REPORT*  Clinical Data:  Left flank pain radiating to groin, UTI, microscopic hematuria, over active bladder, past history of uterine cancer post surgery and radiation therapy  CT ABDOMEN AND PELVIS WITHOUT CONTRAST  Technique:  Multidetector CT imaging of the abdomen and pelvis was performed following the standard protocol without intravenous contrast. Sagittal and coronal MPR images reconstructed from axial data set.  Comparison: 08/08/2009  Findings: Chronic subsegmental atelectasis versus scarring left lower lobe. Left hydronephrosis and hydroureter secondary to a 2 mm diameter calculus at the left ureterovesicle junction. Additional tiny bilateral nonobstructing renal calculi. Within limits of a nonenhanced exam, no additional focal  abnormalities of the liver, spleen, pancreas, kidneys, or adrenal glands. Probable small splenule at splenic hilum.  Uterus surgically absent with nonvisualization of the ovaries. Normal appendix. Tiny umbilical hernia containing fat. No mass, adenopathy, free fluid or inflammatory process. Normal-sized inguinal lymph nodes bilaterally. Question small bone island right ilium.  IMPRESSION: Left hydronephrosis and hydroureter secondary to a 2 mm diameter left UVJ calculus. Additional tiny bilateral nonobstructing renal calculi. Tiny umbilical hernia.  Original Report Authenticated By: Lollie Marrow, M.D.     No diagnosis found.    MDM  Strain urine, follow up with urology in seven days take  all medications as directed.  Return for intractable pain, fevers, chills, inability to tolerate orals or any concerns.  Patient verbalizes understanding and agrees to follow up       Eastyn Skalla Smitty Cords, MD 08/23/11 [phone]

## 2011-08-23 NOTE — ED Notes (Signed)
Patient returned from CT

## 2011-08-23 NOTE — Discharge Instructions (Signed)
Kidney Stones Kidney stones (ureteral lithiasis) are solid masses that form inside your kidneys. The intense pain is caused by the stone moving through the kidney, ureter, bladder, and urethra (urinary tract). When the stone moves, the ureter starts to spasm around the stone. The stone is usually passed in the urine.  HOME CARE  Drink enough fluids to keep your pee (urine) clear or pale yellow. This helps to get the stone out.   Strain all pee through the provided strainer. Do not pee without peeing through the strainer, not even once. If you pee the stone out, catch it. The stone may be as small as a grain of salt. Take this to your doctor.   Only take medicine as told by your doctor.   Follow up with your doctor as told.   Get follow-up X-rays as told by your doctor.  GET HELP RIGHT AWAY IF:   Your pain does not get better with medicine.   You have a fever.   Your pain increases and gets worse over 18 hours.   You have new belly (abdominal) pain.   You feel faint or pass out.  MAKE SURE YOU:   Understand these instructions.   Will watch your condition.   Will get help right away if you are not doing well or get worse.  Document Released: 11/22/2007 Document Revised: 05/25/2011 Document Reviewed: 04/02/2009 [employer] Patient Information 2012 [employer]. 

## 2011-08-23 NOTE — ED Notes (Signed)
Patient ambulatory with steady gait with friend. Respirations equal and unlabored. Skin warm and dry. No acute distress noted. Urine strainer given to patient.

## 2011-08-28 ENCOUNTER — Ambulatory Visit: Payer: Medicaid Other | Admitting: Radiation Oncology

## 2011-09-04 ENCOUNTER — Other Ambulatory Visit (HOSPITAL_COMMUNITY)
Admission: RE | Admit: 2011-09-04 | Discharge: 2011-09-04 | Disposition: A | Discharge status: Home / Self Care | Payer: Medicaid Other | Source: Ambulatory Visit | Attending: Radiation Oncology | Admitting: Radiation Oncology

## 2011-09-04 ENCOUNTER — Ambulatory Visit
Admission: RE | Admit: 2011-09-04 | Discharge: 2011-09-04 | Disposition: A | Discharge status: Home / Self Care | Payer: Medicaid Other | Source: Ambulatory Visit | Attending: Radiation Oncology | Admitting: Radiation Oncology

## 2011-09-04 ENCOUNTER — Encounter: Payer: Self-pay | Admitting: Radiation Oncology

## 2011-09-04 DIAGNOSIS — C55 Malignant neoplasm of uterus, part unspecified: Secondary | ICD-10-CM

## 2011-09-04 DIAGNOSIS — Z01419 Encounter for gynecological examination (general) (routine) without abnormal findings: Secondary | ICD-10-CM | POA: Insufficient documentation

## 2011-09-04 NOTE — Progress Notes (Signed)
Here for routine follow up post radiation of pelvis for diagnosis of endometrial ca 09/23/2009. Last pap smear 04/2011 negative for intraepithelial cells. New medication for over active bladder(myrbetriq). Kidney stone diagnosis on ED visit about 2 weeks ago.Stone did pass but still passing fragments.

## 2011-09-04 NOTE — Progress Notes (Signed)
Encounter addended by: Billie Lade, MD on: 09/04/2011 10:46 AM<BR>     Documentation filed: Orders

## 2011-09-04 NOTE — Progress Notes (Signed)
CC:   Rhonda Schimke, MD Osborn Coho, M.D. Eduardo Osier. Sharyn Lull, M.D.  DIAGNOSIS:  Stage IB endometrioid adenocarcinoma.  INTERVAL SINCE RADIATION THERAPY:  One year 11 months.  NARRATIVE:  Rhonda Higgins comes in today for routine followup.  She clinically seems to be doing well at this time.  The patient continues to see Dr. Osborn Coho and Dr. Nelly Rout with her followup.  The patient did see Dr. Nelly Rout late last year with an unremarkable exam and Pap smear.  Since that time, the patient denies any vaginal bleeding.  She has noticed problems with constipation since her surgery. I have recommended that she increase fiber in her diet and drink plenty of water.  If this is not helpful, then she should consult with her primary care physician concerning this issue.  The patient denies any pelvic or abdominal pain or problems with nausea.  The patient denies any swelling in her lower extremities.  The patient denies any hematuria or rectal bleeding.  PHYSICAL EXAMINATION:  Vital Signs:  The patient's temperature is 98.3. Pulse is 89, blood pressure is 119/76, weight is 244 pounds, which is stable.  Lymph:  Examination of the neck and supraclavicular region reveals no evidence of adenopathy.  Lungs:  Clear to auscultation. Heart:  Has a regular rhythm and rate.  Abdomen:  Examination of the abdomen reveals it to be soft and nontender with normal bowel sounds. The inguinal areas are free of adenopathy.  Pelvic Examination:  The external genitalia are unremarkable.  A speculum exam is performed.  The vaginal cuff is well-healed without any erythema or mucosal lesions.  A Pap smear is obtained of the proximal vagina.  On bimanual and rectovaginal examination, there are no pelvic masses appreciated.  IMPRESSION AND PLAN:  Clinically no evident disease, Pap smear pending. The patient will follow up in 9 months and in the interim will see Dr. Nelly Rout and Dr.  Su Hilt.    ______________________________ Rhonda Higgins, Ph.D., M.D. JDK/MEDQ  D:  09/04/2011  T:  09/04/2011  Job:  [id number]

## 2011-09-11 ENCOUNTER — Telehealth: Payer: Self-pay | Admitting: Gynecologic Oncology

## 2011-09-11 ENCOUNTER — Telehealth: Payer: Self-pay

## 2011-09-11 NOTE — Telephone Encounter (Signed)
Pt informed of pap results.  No concerns or questions voiced.

## 2011-09-11 NOTE — Telephone Encounter (Signed)
Patient contacted by phone. Pap smear by Dr. Antony Blackbird  from 09/04/2011 negative for intraepithelial cells.

## 2011-10-26 ENCOUNTER — Encounter: Payer: Self-pay | Admitting: Pulmonary Disease

## 2011-10-26 ENCOUNTER — Ambulatory Visit (INDEPENDENT_AMBULATORY_CARE_PROVIDER_SITE_OTHER): Payer: Medicaid Other | Admitting: Pulmonary Disease

## 2011-10-26 VITALS — BP 118/78 | HR 79 | Temp 98.1°F | Ht 63.0 in | Wt 244.0 lb

## 2011-10-26 DIAGNOSIS — G4733 Obstructive sleep apnea (adult) (pediatric): Secondary | ICD-10-CM

## 2011-10-26 NOTE — Patient Instructions (Signed)
Continue with cpap, and call if having any tolerance issues. Work on weight loss Keep up with supplies and mask changes. followup with me in one year if doing well.

## 2011-10-26 NOTE — Progress Notes (Signed)
  Subjective:    Patient ID: Rhonda Higgins, female    DOB: 1970-12-05, 41 y.o.   MRN: [id number]  HPI The patient comes in today for followup of her known obstructive sleep apnea.  She is wearing CPAP compliantly, and feels that she sleeps well with the device with adequate daytime alertness.  She has had some issues during allergy season with nasal congestion and sometimes interferes with using her CPAP, but this is improving.  She is having no issues with mask fit or pressure.  Her weight is down 3 pounds since the last visit.   Review of Systems  Constitutional: Negative.  Negative for fever and unexpected weight change.  HENT: Positive for congestion, rhinorrhea and sneezing. Negative for ear pain, nosebleeds, sore throat, trouble swallowing, dental problem, postnasal drip and sinus pressure.   Eyes: Positive for redness and itching.  Respiratory: Negative.  Negative for cough, chest tightness, shortness of breath and wheezing.   Cardiovascular: Negative.  Negative for palpitations and leg swelling.  Gastrointestinal: Negative.  Negative for nausea and vomiting.  Genitourinary: Negative.  Negative for dysuria.  Musculoskeletal: Negative.  Negative for joint swelling.  Skin: Negative.  Negative for rash.  Neurological: Negative.  Negative for headaches.  Hematological: Negative.  Does not bruise/bleed easily.  Psychiatric/Behavioral: Negative.  Negative for dysphoric mood. The patient is not nervous/anxious.        Objective:   Physical Exam Obese female in no acute distress No skin breakdown or pressure necrosis from the CPAP mask Lower extremities with minimal edema, no cyanosis Alert and oriented, moves all 4 extremities.        Assessment & Plan:

## 2011-10-26 NOTE — Assessment & Plan Note (Signed)
The patient is doing very well with CPAP, and feels that it continues to help her.  I have asked her to stay on CPAP, and work aggressively on weight loss.  If she continues to have a lot of nasal congestion issues that interferes with using CPAP regularly, she is to let us know.  She will followup in one year or sooner if having issues.

## 2012-02-29 ENCOUNTER — Encounter: Payer: Self-pay | Admitting: Obstetrics and Gynecology

## 2012-02-29 ENCOUNTER — Ambulatory Visit (INDEPENDENT_AMBULATORY_CARE_PROVIDER_SITE_OTHER): Payer: Medicaid Other | Admitting: Obstetrics and Gynecology

## 2012-02-29 VITALS — BP 118/78 | HR 82 | Ht 63.0 in | Wt 247.0 lb

## 2012-02-29 DIAGNOSIS — Z01419 Encounter for gynecological examination (general) (routine) without abnormal findings: Secondary | ICD-10-CM

## 2012-02-29 DIAGNOSIS — Z124 Encounter for screening for malignant neoplasm of cervix: Secondary | ICD-10-CM

## 2012-02-29 DIAGNOSIS — Z0000 Encounter for general adult medical examination without abnormal findings: Secondary | ICD-10-CM

## 2012-02-29 NOTE — Progress Notes (Signed)
Regular Periods: no Mammogram: yes  Monthly Breast Ex.: yes Exercise: no  Tetanus < 10 years: no Seatbelts: yes  NI. Bladder Functn.: yes Abuse at home: no  Daily BM's: yes Stressful Work: no  Healthy Diet: yes Sigmoid-Colonoscopy: NO  Calcium: yes Medical problems this year: NO PROBLEMS   LAST PAP:2010  Contraception: NONE  Mammogram:  2/13    NL  PCP: DR. Yetta Barre  PMH: NO CHANGE  FMH: NO CHANGE  Last Bone Scan: NO

## 2012-02-29 NOTE — Addendum Note (Signed)
Addended byWinfred Leeds on: 02/29/2012 02:32 PM   Modules accepted: Orders

## 2012-02-29 NOTE — Progress Notes (Signed)
Subjective:    Rhonda Higgins is a 41 y.o. female, G0P0, who presents for an annual exam. Patient is S/P hysterectomy due to uterine adenocarcinoma.  Menstrual cycle:   LMP: No LMP recorded. Patient has had a hysterectomy.             Review of Systems Pertinent items are noted in HPI. Denies pelvic pain, urinary tract symptoms, vaginitis symptoms, irregular bleeding, menopausal symptoms, change in bowel habits or rectal bleeding   Objective:    BP 118/78  Pulse 82  Ht 5\' 3"  (1.6 m)  Wt 247 lb (112.038 kg)  BMI 43.75 kg/m2   Wt Readings from Last 1 Encounters:  02/29/12 247 lb (112.038 kg)   Body mass index is 43.75 kg/(m^2). General Appearance: Alert, no acute distress HEENT: Grossly normal Neck / Thyroid: Supple, no thyromegaly or cervical adenopathy Lungs: Clear to auscultation bilaterally Back: No CVA tenderness Breast Exam: No masses or nodes.No dimpling, nipple retraction or discharge. Cardiovascular: Regular rate and rhythm.  Gastrointestinal: Soft, non-tender, no masses or organomegaly Pelvic: EGBUS-wnl, vagina-narrowed but normal, uterus/cervix/adnexae-surgically absent Rectovaginal: no masses and normal sphincter tone Lymphatic Exam: Non-palpable nodes in neck, clavicular,  axillary, or inguinal regions  Skin: no rashes or abnormalities Extremities: no clubbing cyanosis or edema  Neurologic: grossly normal Psychiatric: Alert and oriented     Assessment:   Routine GYN Exam S/P Hysterectomy for Adenocarcinoma of the uterus   Plan:    PAP sent (patient request)  RTO 1 year or prn  Dawn Kiper,ELMIRAPA-C

## 2012-03-04 LAB — PAP IG W/ RFLX HPV ASCU

## 2012-03-05 ENCOUNTER — Telehealth: Payer: Self-pay | Admitting: Obstetrics and Gynecology

## 2012-03-05 MED ORDER — FLUCONAZOLE 150 MG PO TABS: 150.0000 mg | ORAL_TABLET | Freq: Once | ORAL | Status: AC

## 2012-03-05 NOTE — Telephone Encounter (Signed)
Rhonda Higgins this pt want something for yeast. Can we call something in for her?

## 2012-03-05 NOTE — Telephone Encounter (Signed)
Patient with poorly controlled diabetes complains of several days of vaginal itchiness/irritation.  Has tried OTC cream but would like to have Diflucan.  Advised that if Diflucan does not work, she will need to come in for evaluation.  Patient verbalized understanding.  Jinna Weinman, PA-C

## 2012-03-05 NOTE — Telephone Encounter (Signed)
LINDA/RX REQ

## 2012-05-14 ENCOUNTER — Encounter: Payer: Self-pay | Admitting: Radiation Oncology

## 2012-05-20 ENCOUNTER — Ambulatory Visit
Admission: RE | Admit: 2012-05-20 | Discharge: 2012-05-20 | Disposition: A | Discharge status: Home / Self Care | Payer: Medicaid Other | Source: Ambulatory Visit | Attending: Radiation Oncology | Admitting: Radiation Oncology

## 2012-05-20 ENCOUNTER — Other Ambulatory Visit (HOSPITAL_COMMUNITY)
Admission: RE | Admit: 2012-05-20 | Discharge: 2012-05-20 | Disposition: A | Discharge status: Home / Self Care | Payer: Medicaid Other | Source: Ambulatory Visit | Attending: Radiation Oncology | Admitting: Radiation Oncology

## 2012-05-20 ENCOUNTER — Encounter: Payer: Self-pay | Admitting: Radiation Oncology

## 2012-05-20 ENCOUNTER — Ambulatory Visit: Payer: Medicaid Other | Admitting: Radiation Oncology

## 2012-05-20 VITALS — BP 130/74 | HR 79 | Temp 98.5°F | Wt 248.7 lb

## 2012-05-20 DIAGNOSIS — Z01419 Encounter for gynecological examination (general) (routine) without abnormal findings: Secondary | ICD-10-CM | POA: Insufficient documentation

## 2012-05-20 DIAGNOSIS — C55 Malignant neoplasm of uterus, part unspecified: Secondary | ICD-10-CM

## 2012-05-20 NOTE — Progress Notes (Signed)
  Radiation Oncology         [phone] ________________________________  Name: Rhonda Higgins MRN: [id number]  Date: 05/20/2012  DOB: August 20, 1970  Follow-Up Visit Note  CC: Paulino Rily, MD  Laurette Schimke, MD PHD  Diagnosis:   Stage IB grade 1 endometrioid adenocarcinoma  Interval Since Last Radiation:  2 years and 7 months she was treated with intracavitary brachytherapy treatments alone.  Narrative:  The patient returns today for routine follow-up.  She is without complaints. She continues to use her vaginal dilator.  She denies any vaginal bleeding rectal bleeding or hematuria.                              ALLERGIES:  is allergic to hydrocodone-acetaminophen and codeine.  Meds: Current Outpatient Prescriptions  Medication Sig Dispense Refill  . aspirin 81 MG tablet Take 81 mg by mouth daily.        [name] buPROPion (WELLBUTRIN XL) 150 MG 24 hr tablet Take 150 mg by mouth daily.        . cetirizine (ZYRTEC) 10 MG tablet Take 10 mg by mouth daily.        [name] GLIMEPIRIDE PO 2 every morning and 1 at night      . ibuprofen (ADVIL,MOTRIN) 200 MG tablet Take 200 mg by mouth every 6 (six) hours as needed.        . LamoTRIgine (LAMICTAL XR) 200 MG TB24 Take 200 mg by mouth daily.        [name] omeprazole (PRILOSEC) 40 MG capsule Take 40 mg by mouth daily.      . risperiDONE (RISPERDAL) 1 MG tablet Take 3 mg by mouth daily.       . simvastatin (ZOCOR) 40 MG tablet Take 40 mg by mouth every evening.      . traZODone (DESYREL) 100 MG tablet Take 100 mg by mouth at bedtime.        Physical Findings: The patient is in no acute distress. Patient is alert and oriented.  weight is 248 lb 11.2 oz (112.81 kg). Her temperature is 98.5 F (36.9 C). Her blood pressure is 130/74 and her pulse is 79. [name]  No palpable cervical supraclavicular or axillary adenopathy. The lungs are clear to auscultation. The heart has regular rhythm and rate. The abdomen is soft and nontender with normal bowel sounds.   the  the inguinal areas are free of adenopathy. On pelvic examination the external genitalia are unremarkable. A speculum exam is performed. The vaginal vault is somewhat narrowed.  There are no mucosal lesions noted. A Pap smear was obtained of the proximal vagina. On bimanual and rectovaginal examination there no pelvic masses appreciated.  Lab Findings: Lab Results  Component Value Date   WBC 8.2 08/23/2011   HGB 14.6 08/23/2011   HCT 43.0 08/23/2011   MCV 77.8* 08/23/2011   PLT 167 08/23/2011      Radiographic Findings: No results found.  Impression:  No evidence of recurrence on clinical exam today, Pap smear pending  Plan:  Routine followup in one year. The patient will be seen by gynecologic oncology and gynecology in the interim.  _____________________________________    Billie Lade, PhD, MD

## 2012-05-20 NOTE — Progress Notes (Signed)
Here for routine follow up completion of pelvic radiation for endometrial cancer April 2011.Denies pain, no urinary symptoms of burning, discharge or bleeding.

## 2012-05-28 ENCOUNTER — Telehealth: Payer: Self-pay

## 2012-05-28 NOTE — Telephone Encounter (Signed)
Patient called and informed of normal pap smear results.

## 2012-10-25 ENCOUNTER — Ambulatory Visit: Payer: Medicaid Other | Admitting: Pulmonary Disease

## 2012-10-30 ENCOUNTER — Ambulatory Visit (INDEPENDENT_AMBULATORY_CARE_PROVIDER_SITE_OTHER): Payer: Medicaid Other | Admitting: Pulmonary Disease

## 2012-10-30 ENCOUNTER — Encounter: Payer: Self-pay | Admitting: Pulmonary Disease

## 2012-10-30 VITALS — BP 142/80 | HR 72 | Temp 98.1°F | Ht 63.75 in | Wt 254.4 lb

## 2012-10-30 DIAGNOSIS — G4733 Obstructive sleep apnea (adult) (pediatric): Secondary | ICD-10-CM

## 2012-10-30 NOTE — Progress Notes (Signed)
  Subjective:    Patient ID: Rhonda Higgins, female    DOB: [birth year], 42 y.o.   MRN: [id number]  HPI The patient comes in today for followup of her obstructive sleep apnea.  She is wearing CPAP intermittently, and blames this on various mask issues but not pressure issues.  The mask is working out her skin, and she is also having increased leaks whenever her face gets oily.  She is unable to use a nasal mask or nasal pillows because of mouth opening.  When she wears the device, she feels that she sleeps well and has adequate daytime alertness.  Of note, her weight is up 10 pounds over the last one year.   Review of Systems  Constitutional: Negative for fever and unexpected weight change.  HENT: Positive for congestion, rhinorrhea, sneezing and postnasal drip. Negative for ear pain, nosebleeds, sore throat, trouble swallowing, dental problem and sinus pressure.        Allergies   Eyes: Negative for redness and itching.  Respiratory: Positive for shortness of breath. Negative for cough, chest tightness and wheezing.   Cardiovascular: Positive for palpitations. Negative for leg swelling.  Gastrointestinal: Negative for nausea and vomiting.  Genitourinary: Negative for dysuria.  Musculoskeletal: Negative for joint swelling.  Skin: Negative for rash.  Neurological: Negative for headaches.  Hematological: Does not bruise/bleed easily.  Psychiatric/Behavioral: Negative for dysphoric mood. The patient is not nervous/anxious.        Objective:   Physical Exam Morbidly obese female in nad Nose without purulence or d/c noted. No skin breakdown or pressure necrosis from the cpap mask. Neck without LN or TMG LE with mild edema, no cyanosis Alert, does not appear overly sleepy, moves all 4.        Assessment & Plan:

## 2012-10-30 NOTE — Assessment & Plan Note (Signed)
The patient is having issues with her mask related to skin irritation and leaks.  I would like to try a pad under her folds effaced past to see if this will help.  If not, would consider nasal pillows and add a chin strap to help with mouth opening.  Also encouraged her to work aggressively on weight loss.

## 2012-10-30 NOTE — Patient Instructions (Addendum)
Will try a cloth pad under cpap mask to see if it will help with rash and leaks.  If you continue to have issues, would consider nasal pillows along with chin strap to help with mouth opening.   Let me know if things are not going well. Work on weight reduction followup with me in one year if doing well.

## 2012-12-02 ENCOUNTER — Telehealth: Payer: Self-pay | Admitting: Gynecologic Oncology

## 2012-12-02 NOTE — Telephone Encounter (Signed)
Follow-up Note: Gyn-Onc   Rhonda Higgins 42 y.o. female  CC: Surveillance Endometrial cancer   Assessment/Plan:  This is a 42 y.o. year old with a stage IB grade 1 endometrioid adenocarcinoma. She underwent robotic hysterectomy bilateral salpingo-oophorectomy pelvic and para-aortic lymph node dissection and adjuvant radiotherapy. There is no evidence of disease present.  Followup with Dr. Roselind Messier  in 3 months.  Followup with Dr. Su Hilt with Pap test in 6 months.  Followup with GYN oncology in 9 months  HPI: This is a 42 year old who underwent a robotic hysterectomy, bilateral salpingo-oophorectomy, bilateral pelvic and periaortic lymph nodes. Final pathology was notable for grade 1 endometrial cancer invading up to 90% of the uterus in the area of the fundus. There was no lymphovascular space invasion. No evidence of metastatic disease to the adnexa. She received adjuvant pelvic radiotherapy. Postoperative course was complicated by an abdominal wall hematoma and residual abdominal pain. [name] states that she is doing very well at this time. She reports hot flashes approximately 4 times per day. Denies awakening. She uses her dilator almost every other day. Denies diarrhea or constipation. Reports weight gain. Denies cough or headache.    Interval History: Pap 05/2012 wnl  Past Surgical Hx:  Past Surgical History  Procedure Laterality Date  . Inner ear surgery      L ear d/t tumor and R ear d/t hole in ear drum  . Tonsillectomy      age 31  . Total abdominal hysterectomy  feb 2011    BSO, node dissection  . Endometrial biopsy      Past Medical Hx:  Past Medical History  Diagnosis Date  . Allergic rhinitis   . Hyperlipidemia   . Diabetes mellitus   . Personality disorder   . Overactive bladder   . UTI (urinary tract infection)   . Depression   . GERD (gastroesophageal reflux disease)   . Non-insulin dependent type 2 diabetes mellitus   . Anemia   . Elevated  cholesterol   . Right lower quadrant pain   . Polyphagia   . Uterine cancer   . Adenocarcinoma     endometrial, FIGO GRADE 1  . Kidney stone 6/13    H/O  . Radiation 10/13/2009    Endometrioid adenocarcinoma    Family Hx:  Family History  Problem Relation Age of Onset  . Asthma Mother   . Heart disease Mother   . Diabetes Mother   . Emphysema Mother   . Hypertension Mother   . Stroke Mother   . Prostate cancer Father   . Heart disease Sister   . Heart attack Brother   . Heart disease Brother   . Heart disease Sister   . Diabetes Sister     Review of Systems:  Constitutional  Feels well good appetite moderate amount of energy. Skin No rash, sores, jaundice, itching, dryness,  Cardiovascular  No chest pain, occasional shortness of breath does be evaluated by a cardiologist. Denies edema Pulmonary  No cough or wheeze.  Gastro Intestinal  No nausea, vomitting, or diarrhoea. No bright red blood per rectum, no abdominal pain, change in bowel movement, or constipation.  Genito Urinary  No frequency, urgency, dysuria, no vaginal bleeding or discharge. Denies incontinence Musculo Skeletal  No myalgia, arthralgia, joint swelling or pain  Neurologic  No weakness, numbness, change in gait,  Psychology  No depression, anxiety, insomnia.     Vitals:  Blood pressure 110/68, pulse 64, temperature 97.9 F (36.6 C),  temperature source Oral, resp. rate 16, height 5' 7.21" (1.707 m), weight 243 lb 1.6 oz (110.269 kg).  Physical Exam: WD female in no acute distress Neck  Supple without any enlargements.  Lymph node survey. No cervical supraclavicular cervical or inguinal adenopathy Cardiovascular  Pulse normal rate, regularity and rhythm. S1 and S2 normal. Lungs  Clear to auscultation bilateraly, without wheezes/crackles/rhonchi. Good air movement.  Skin  No rash/lesions/breakdown  Psychiatry  Alert and oriented to person, place, and time  Back No CVA  tenderness Abdomen  Normoactive bowel sounds, abdomen soft, non-tender and obese. Surgical  sites intact without evidence of hernia.  Genito Urinary  Vulva/vagina: Normal external female genitalia.  No lesions.   Bladder/urethra:  No lesions or masses  Vagina: Atrophic without any lesions masses or cul-de-sac nodularity Rectal  Good tone, no masses no cul de sac nodularity.  Extremities  No bilateral cyanosis, clubbing or edema.     Laurette Schimke, MD., PhD. 12/02/2012, 9:36 PM

## 2012-12-03 ENCOUNTER — Encounter: Payer: Self-pay | Admitting: Gynecologic Oncology

## 2012-12-03 ENCOUNTER — Ambulatory Visit: Payer: Medicaid Other | Attending: Gynecologic Oncology | Admitting: Gynecologic Oncology

## 2012-12-03 VITALS — BP 110/70 | HR 66 | Temp 98.2°F | Resp 16 | Ht 63.54 in | Wt 252.0 lb

## 2012-12-03 DIAGNOSIS — K219 Gastro-esophageal reflux disease without esophagitis: Secondary | ICD-10-CM | POA: Insufficient documentation

## 2012-12-03 DIAGNOSIS — Z923 Personal history of irradiation: Secondary | ICD-10-CM | POA: Insufficient documentation

## 2012-12-03 DIAGNOSIS — Z90710 Acquired absence of both cervix and uterus: Secondary | ICD-10-CM | POA: Insufficient documentation

## 2012-12-03 DIAGNOSIS — C55 Malignant neoplasm of uterus, part unspecified: Secondary | ICD-10-CM

## 2012-12-03 DIAGNOSIS — C549 Malignant neoplasm of corpus uteri, unspecified: Secondary | ICD-10-CM | POA: Insufficient documentation

## 2012-12-03 DIAGNOSIS — E785 Hyperlipidemia, unspecified: Secondary | ICD-10-CM | POA: Insufficient documentation

## 2012-12-03 DIAGNOSIS — Z9079 Acquired absence of other genital organ(s): Secondary | ICD-10-CM | POA: Insufficient documentation

## 2012-12-03 DIAGNOSIS — E119 Type 2 diabetes mellitus without complications: Secondary | ICD-10-CM | POA: Insufficient documentation

## 2012-12-03 NOTE — Patient Instructions (Signed)
Followup with GYN oncology in 12 months  Followup with Dr. Roselind Messier in 6 months    Thank you very much [name] for allowing me to provide care for you today.  I appreciate your confidence in choosing our Gynecologic Oncology team.  If you have any questions about your visit today please call our office and we will get back to you as soon as possible.  Rhonda Higgins. Dabria Wadas MD., PhD Gynecologic Oncology

## 2012-12-03 NOTE — Progress Notes (Signed)
Follow-up Note: Gyn-Onc   Rhonda Higgins 42 y.o. female  CC: Surveillance Endometrial cancer NED   Assessment/Plan:  This is a 42 y.o. year old with a stage IB grade 1 endometrioid adenocarcinoma. She underwent robotic hysterectomy bilateral salpingo-oophorectomy pelvic and para-aortic lymph node dissection and adjuvant radiotherapy. There is no evidence of disease present.  Followup with Dr. Roselind Messier  in 6 months.  Followup with Dr. Su Hilt with Pap test as scheduled  Followup with GYN oncology in 12 months  HPI: This is a 42 y.o. who underwent a robotic hysterectomy, bilateral salpingo-oophorectomy, bilateral pelvic and periaortic lymph nodes. Final pathology was notable for grade 1 endometrial cancer invading up to 90% of the uterus in the area of the fundus. There was no lymphovascular space invasion. No evidence of metastatic disease to the adnexa. She received adjuvant pelvic radiotherapy. Postoperative course was complicated by an abdominal wall hematoma and residual abdominal pain. [name] states that she is doing very well at this time. She reports hot flashes approximately 4 times per day. Denies awakening. She uses her dilator almost every other day. Denies diarrhea or constipation. Reports weight gain. Denies cough or headache.    Interval History: Pap 05/2012 wnl.  Feels well, reports weigh gain  Past Surgical Hx:  Past Surgical History  Procedure Laterality Date  . Inner ear surgery      L ear d/t tumor and R ear d/t hole in ear drum  . Tonsillectomy      age 42  . Total abdominal hysterectomy  feb 2011    BSO, node dissection  . Endometrial biopsy      Past Medical Hx:  Past Medical History  Diagnosis Date  . Allergic rhinitis   . Hyperlipidemia   . Diabetes mellitus   . Personality disorder   . Overactive bladder   . UTI (urinary tract infection)   . Depression   . GERD (gastroesophageal reflux disease)   . Non-insulin dependent type 2 diabetes  mellitus   . Anemia   . Elevated cholesterol   . Right lower quadrant pain   . Polyphagia   . Uterine cancer   . Adenocarcinoma     endometrial, FIGO GRADE 1  . Kidney stone 6/13    H/O  . Radiation 10/13/2009    Endometrioid adenocarcinoma    Family Hx:  Family History  Problem Relation Age of Onset  . Asthma Mother   . Heart disease Mother   . Diabetes Mother   . Emphysema Mother   . Hypertension Mother   . Stroke Mother   . Prostate cancer Father   . Heart disease Sister   . Heart attack Brother   . Heart disease Brother   . Heart disease Sister   . Diabetes Sister     Review of Systems:  Constitutional  Feels well good appetite moderate amount of energy. Skin No rash, sores, jaundice, itching, dryness,  Cardiovascular  No chest pain, occasional shortness of breath does be evaluated by a cardiologist.  Pulmonary  No cough or wheeze.  Gastro Intestinal  No nausea, vomitting, or diarrhoea. No bright red blood per rectum, no abdominal pain, change in bowel movement, or constipation.  Genito Urinary  No frequency, urgency, dysuria, no vaginal bleeding or discharge. Denies incontinence Musculo Skeletal  No myalgia, arthralgia, joint swelling or pain  Neurologic  No weakness, numbness, change in gait,  Psychology  No depression, anxiety, insomnia.     Vitals:  Blood pressure 110/70, pulse  66, temperature 98.2 F (36.8 C), resp. rate 16, height 5' 3.54" (1.614 m), weight 252 lb (114.306 kg).  Physical Exam: WD female in no acute distress Neck  Supple without any enlargements.  Lymph node survey. No cervical supraclavicular cervical or inguinal adenopathy Cardiovascular  Pulse normal rate, regularity and rhythm. S1 and S2 normal. Lungs  Clear to auscultation bilateraly, without wheezes/crackles/rhonchi. Good air movement.  Psychiatry  Alert and oriented to person, place, and time  Back No CVA tenderness Abdomen  Normoactive bowel sounds, abdomen soft,  non-tender and obese. Surgical  sites intact without evidence of hernia or masses Genito Urinary  Vulva/vagina: Normal external female genitalia.  No lesions.   Bladder/urethra:  No lesions or masses  Vagina: Atrophic without any lesions masses or cul-de-sac nodularity Rectal  Good tone, no masses no cul de sac nodularity.  Extremities  No bilateral cyanosis, clubbing or edema.     Laurette Schimke, MD., PhD. 12/03/2012, 10:09 AM

## 2013-02-08 ENCOUNTER — Encounter (HOSPITAL_COMMUNITY): Payer: Self-pay | Admitting: Emergency Medicine

## 2013-02-08 ENCOUNTER — Emergency Department (HOSPITAL_COMMUNITY)
Admission: EM | Admit: 2013-02-08 | Discharge: 2013-02-08 | Disposition: A | Discharge status: Home / Self Care | Payer: Medicaid Other | Attending: Emergency Medicine | Admitting: Emergency Medicine

## 2013-02-08 ENCOUNTER — Emergency Department (HOSPITAL_COMMUNITY): Payer: Medicaid Other

## 2013-02-08 DIAGNOSIS — F3289 Other specified depressive episodes: Secondary | ICD-10-CM | POA: Insufficient documentation

## 2013-02-08 DIAGNOSIS — R300 Dysuria: Secondary | ICD-10-CM | POA: Insufficient documentation

## 2013-02-08 DIAGNOSIS — Z8709 Personal history of other diseases of the respiratory system: Secondary | ICD-10-CM | POA: Insufficient documentation

## 2013-02-08 DIAGNOSIS — Z923 Personal history of irradiation: Secondary | ICD-10-CM | POA: Insufficient documentation

## 2013-02-08 DIAGNOSIS — R739 Hyperglycemia, unspecified: Secondary | ICD-10-CM

## 2013-02-08 DIAGNOSIS — Z87442 Personal history of urinary calculi: Secondary | ICD-10-CM | POA: Insufficient documentation

## 2013-02-08 DIAGNOSIS — Z862 Personal history of diseases of the blood and blood-forming organs and certain disorders involving the immune mechanism: Secondary | ICD-10-CM | POA: Insufficient documentation

## 2013-02-08 DIAGNOSIS — E785 Hyperlipidemia, unspecified: Secondary | ICD-10-CM | POA: Insufficient documentation

## 2013-02-08 DIAGNOSIS — K219 Gastro-esophageal reflux disease without esophagitis: Secondary | ICD-10-CM | POA: Insufficient documentation

## 2013-02-08 DIAGNOSIS — Z8542 Personal history of malignant neoplasm of other parts of uterus: Secondary | ICD-10-CM | POA: Insufficient documentation

## 2013-02-08 DIAGNOSIS — N201 Calculus of ureter: Secondary | ICD-10-CM | POA: Insufficient documentation

## 2013-02-08 DIAGNOSIS — Z87440 Personal history of urinary (tract) infections: Secondary | ICD-10-CM | POA: Insufficient documentation

## 2013-02-08 DIAGNOSIS — F329 Major depressive disorder, single episode, unspecified: Secondary | ICD-10-CM | POA: Insufficient documentation

## 2013-02-08 DIAGNOSIS — E119 Type 2 diabetes mellitus without complications: Secondary | ICD-10-CM | POA: Insufficient documentation

## 2013-02-08 DIAGNOSIS — N1330 Unspecified hydronephrosis: Secondary | ICD-10-CM | POA: Insufficient documentation

## 2013-02-08 DIAGNOSIS — Z8659 Personal history of other mental and behavioral disorders: Secondary | ICD-10-CM | POA: Insufficient documentation

## 2013-02-08 DIAGNOSIS — E7800 Pure hypercholesterolemia, unspecified: Secondary | ICD-10-CM | POA: Insufficient documentation

## 2013-02-08 DIAGNOSIS — Z79899 Other long term (current) drug therapy: Secondary | ICD-10-CM | POA: Insufficient documentation

## 2013-02-08 DIAGNOSIS — N132 Hydronephrosis with renal and ureteral calculous obstruction: Secondary | ICD-10-CM

## 2013-02-08 LAB — POCT I-STAT, CHEM 8
BUN: 9 mg/dL (ref 6–23)
Calcium, Ion: 1.17 mmol/L (ref 1.12–1.23)
Chloride: 101 mEq/L (ref 96–112)
Creatinine, Ser: 1.1 mg/dL (ref 0.50–1.10)
Glucose, Bld: 306 mg/dL — ABNORMAL HIGH (ref 70–99)
HCT: 48 % — ABNORMAL HIGH (ref 36.0–46.0)
Hemoglobin: 16.3 g/dL — ABNORMAL HIGH (ref 12.0–15.0)
Potassium: 3.7 mEq/L (ref 3.5–5.1)
Sodium: 139 mEq/L (ref 135–145)
TCO2: 24 mmol/L (ref 0–100)

## 2013-02-08 LAB — URINALYSIS, ROUTINE W REFLEX MICROSCOPIC
Bilirubin Urine: NEGATIVE
Glucose, UA: >1000 mg/dL — AB
Ketones, ur: NEGATIVE mg/dL
Leukocytes, UA: NEGATIVE
Nitrite: NEGATIVE
Protein, ur: NEGATIVE mg/dL
Specific Gravity, Urine: 1.03 (ref 1.005–1.030)
Urobilinogen, UA: 0.2 mg/dL (ref 0.0–1.0)
pH: 7 (ref 5.0–8.0)

## 2013-02-08 LAB — URINE MICROSCOPIC-ADD ON

## 2013-02-08 IMAGING — CT CT ABD-PELV W/O CM
1 series · 15 of 27 positions shown, 19 images · non-contrast
Comparison: [DATE]

CLINICAL DATA: Right flank pain, dysuria.

CT ABDOMEN AND PELVIS WITHOUT CONTRAST
TECHNIQUE: Multidetector CT imaging of the abdomen and pelvis was
performed following the standard protocol without intravenous
contrast.

[Series 4: lung · axial · 0.93mm/px · z∈[-176,-56]mm · 15 of 27 slices shown, 19 images]
[im 2/27  soft-tissue]
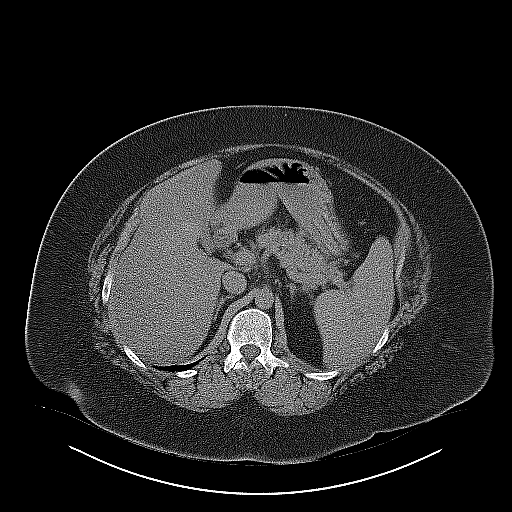
[im 2/27  bone]
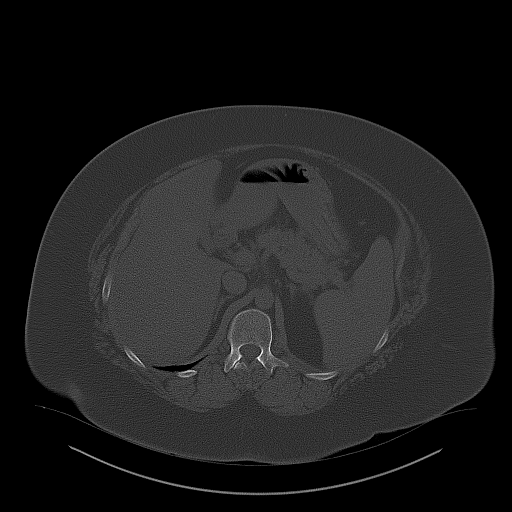
[im 4/27  soft-tissue]
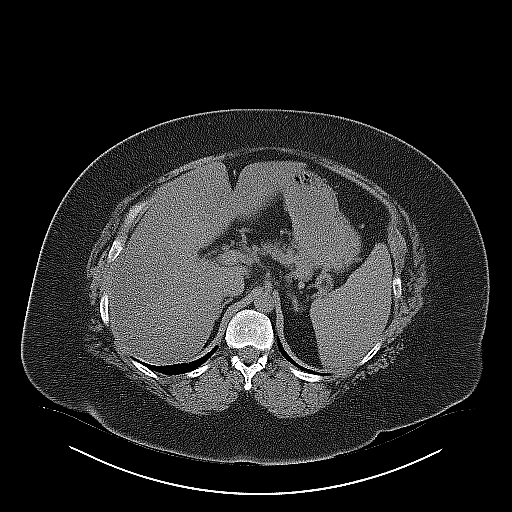
[im 6/27  soft-tissue]
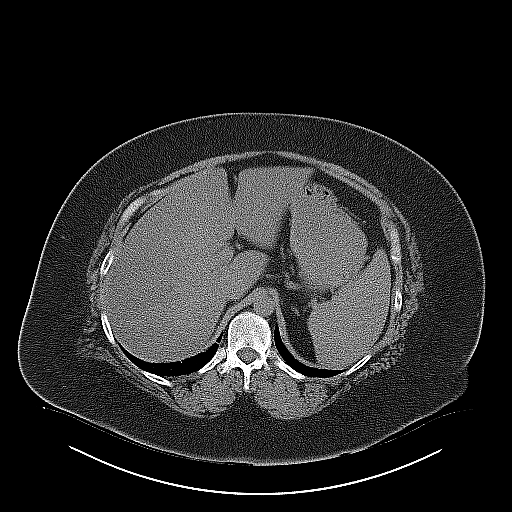
[im 8/27  soft-tissue]
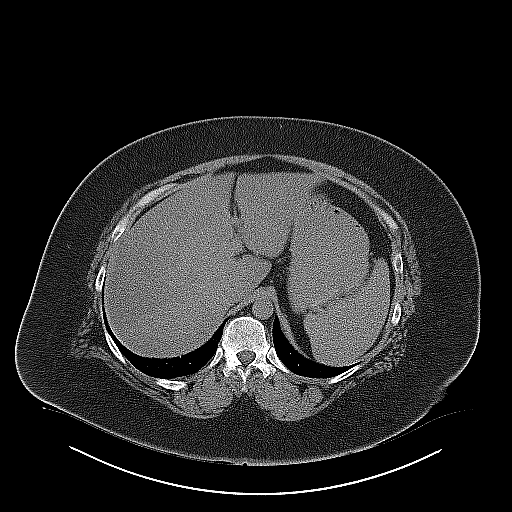
[im 10/27  soft-tissue]
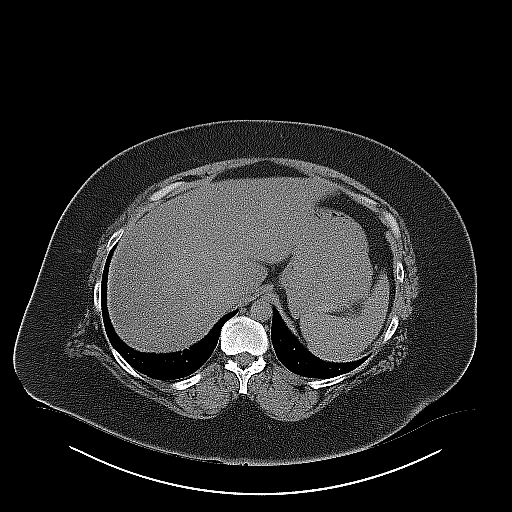
[im 12/27  soft-tissue]
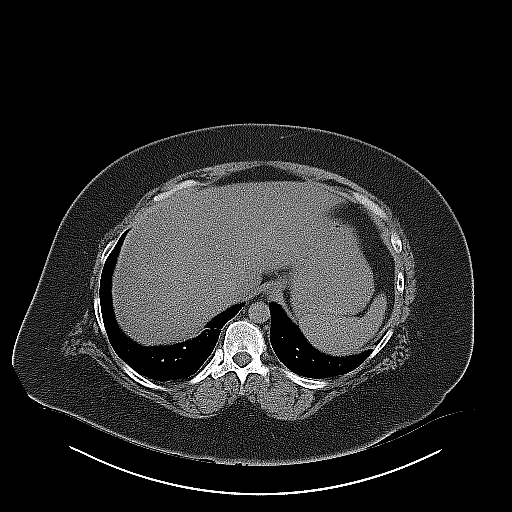
[im 14/27  soft-tissue]
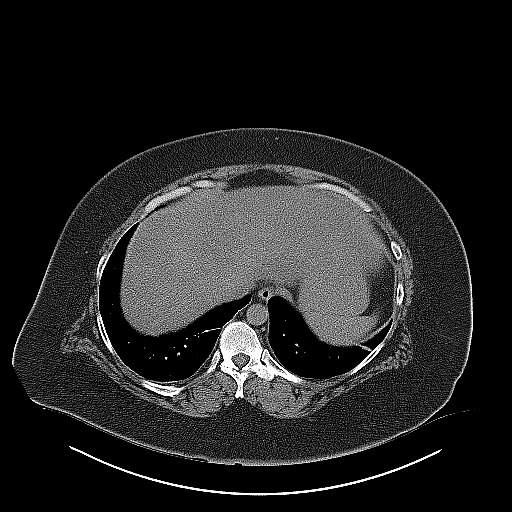
[im 16/27  soft-tissue]
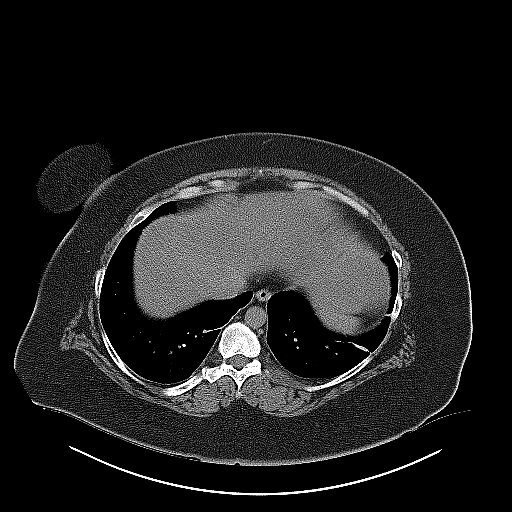
[im 18/27  soft-tissue]
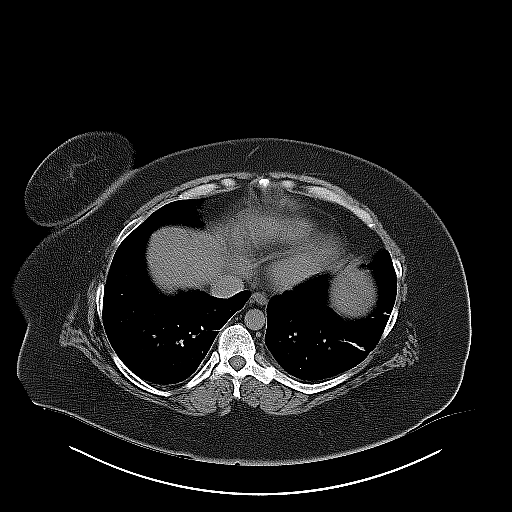
[im 18/27  bone]
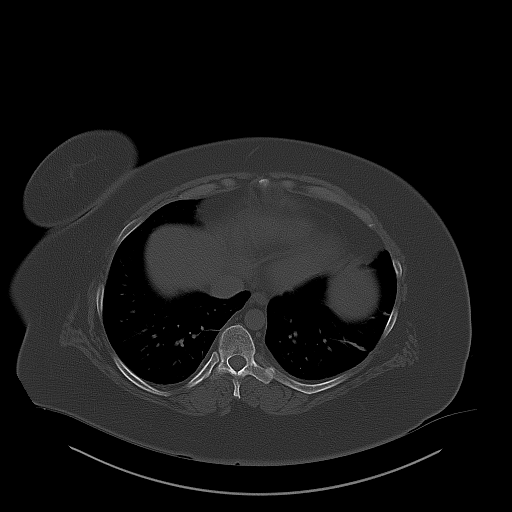
[im 20/27  soft-tissue]
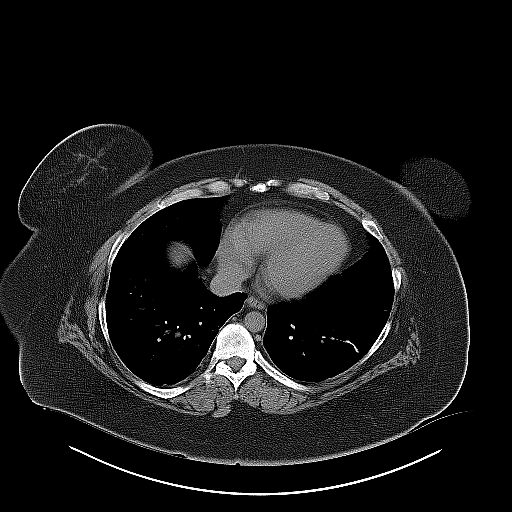
[im 22/27  soft-tissue]
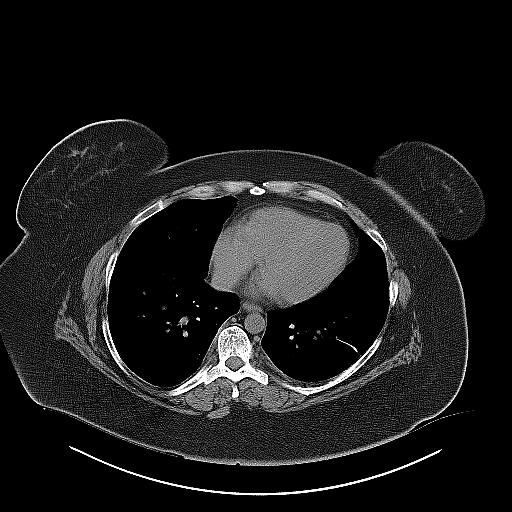
[im 23/27  lung]
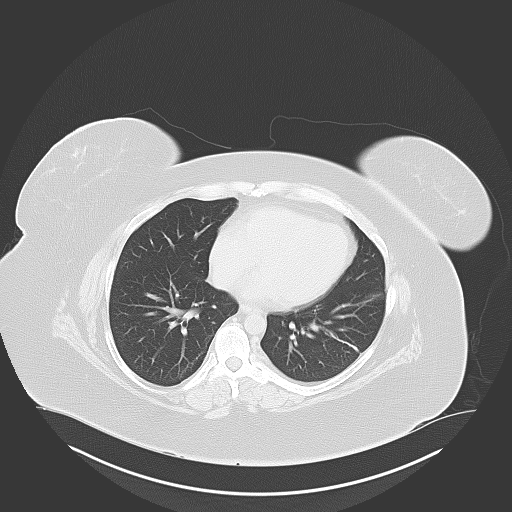
[im 24/27  soft-tissue]
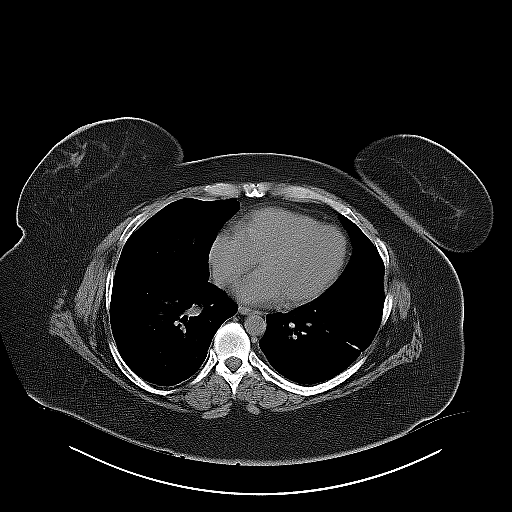
[im 24/27  lung]
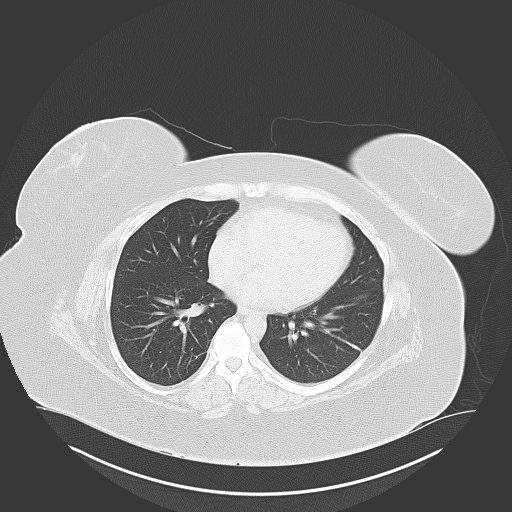
[im 25/27  lung]
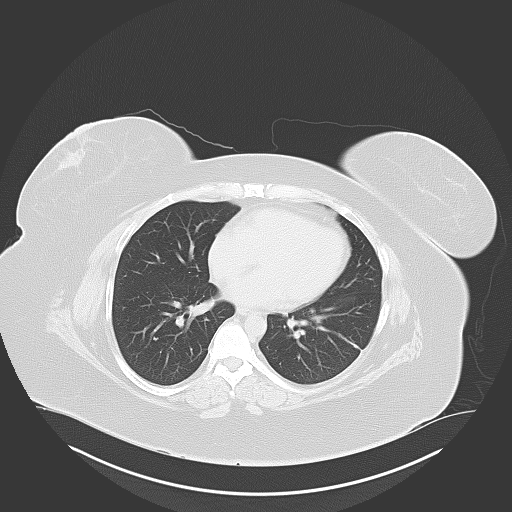
[im 26/27  soft-tissue]
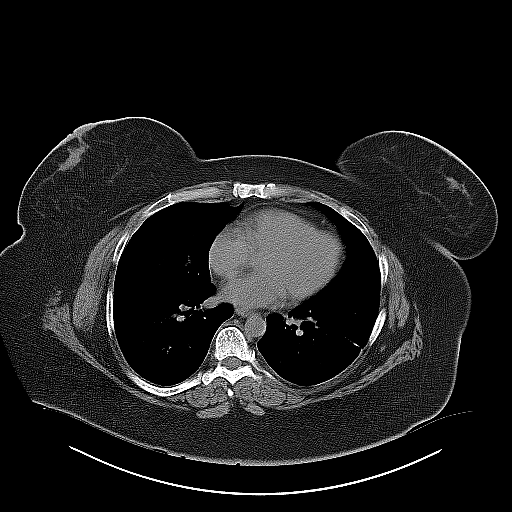
[im 26/27  lung]
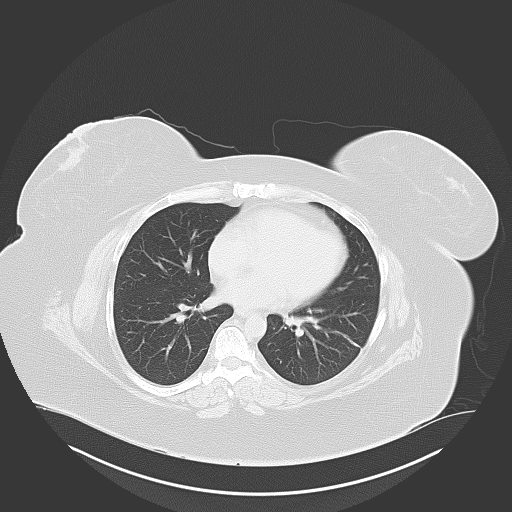

[15 of 27 positions shown; findings below may reference images not displayed]

FINDINGS: There is some minimal linear scarring or subsegmental
atelectasis in the visualized lung bases.  Fatty infiltration of
the liver without focal lesion.  Unremarkable uninfused evaluation
of nondistended gallbladder, spleen, adrenal glands, pancreas,
abdominal aorta.

There is mild right hydronephrosis and ureterectasis down to the
level of a 3 mm calculus near the ureteral orifice.  1 mm
calcification in the mid pole of the left renal collecting system.
Renal parenchyma is unremarkable.

Stomach, small bowel, and colon are nondilated.  Normal appendix.
Urinary bladder is incompletely distended.  Previous hysterectomy.
No ascites.  No free air.  No adenopathy. Tiny umbilical hernia
containing only mesenteric fat.
IMPRESSION: 1.  3 mm distal right ureteral calculus with mild right
hydronephrosis.
2.  Left nephrolithiasis.
3.  Fatty liver.

## 2013-02-08 MED ORDER — DIPHENHYDRAMINE HCL 25 MG PO CAPS
25.0000 mg | ORAL_CAPSULE | Freq: Once | ORAL | Status: AC
  Administered 2013-02-08: 25 mg via ORAL
  Filled 2013-02-08: qty 1

## 2013-02-08 MED ORDER — IBUPROFEN 800 MG PO TABS: 800.0000 mg | ORAL_TABLET | Freq: Three times a day (TID) | ORAL | Status: DC | PRN

## 2013-02-08 MED ORDER — ONDANSETRON 8 MG PO TBDP
8.0000 mg | ORAL_TABLET | Freq: Once | ORAL | Status: AC
  Administered 2013-02-08: 8 mg via ORAL
  Filled 2013-02-08: qty 1

## 2013-02-08 MED ORDER — METOCLOPRAMIDE HCL 10 MG PO TABS
10.0000 mg | ORAL_TABLET | Freq: Once | ORAL | Status: AC
  Administered 2013-02-08: 10 mg via ORAL
  Filled 2013-02-08: qty 1

## 2013-02-08 MED ORDER — PROMETHAZINE HCL 25 MG PO TABS: 25.0000 mg | ORAL_TABLET | Freq: Four times a day (QID) | ORAL | Status: DC | PRN

## 2013-02-08 MED ORDER — HYDROMORPHONE HCL PF 2 MG/ML IJ SOLN
2.0000 mg | Freq: Once | INTRAMUSCULAR | Status: AC
  Administered 2013-02-08: 2 mg via INTRAMUSCULAR
  Filled 2013-02-08: qty 1

## 2013-02-08 NOTE — ED Provider Notes (Signed)
Medical screening examination/treatment/procedure(s) were performed by non-physician practitioner and as supervising physician I was immediately available for consultation/collaboration. Devoria Albe, MD, Armando Gang   Ward Givens, MD 02/08/13 [phone]

## 2013-02-08 NOTE — ED Provider Notes (Signed)
CSN: [id number]     Arrival date & time 02/08/13  1205 History     First MD Initiated Contact with Patient 02/08/13 1220     Chief Complaint  Patient presents with  . Flank Pain  . Abdominal Pain   (Consider location/radiation/quality/duration/timing/severity/associated sxs/prior Treatment) HPI Comments: Patient reports one week of dysuria and suprapubic pain and one day of right flank pain.  Patient's right flank pain is constant, does not radiate, described as aching.  The dysuria is described as burning and painful just at the opening of the urethra.  States this feels like prior kidney stone.  Pain is 8/10 intensity.  Denies fevers, chills, body aches, N/V, change in bowel habits.   Patient is a 42 y.o. female presenting with flank pain and abdominal pain. The history is provided by the patient.  Flank Pain Associated symptoms include abdominal pain. Pertinent negatives include no chest pain, chills, coughing, fever, myalgias, nausea or vomiting.  Abdominal Pain Associated symptoms: dysuria   Associated symptoms: no chest pain, no chills, no constipation, no cough, no diarrhea, no fever, no nausea, no shortness of breath, no vaginal discharge and no vomiting     Past Medical History  Diagnosis Date  . Allergic rhinitis   . Hyperlipidemia   . Diabetes mellitus   . Personality disorder   . Overactive bladder   . UTI (urinary tract infection)   . Depression   . GERD (gastroesophageal reflux disease)   . Non-insulin dependent type 2 diabetes mellitus   . Anemia   . Elevated cholesterol   . Right lower quadrant pain   . Polyphagia   . Uterine cancer   . Adenocarcinoma     endometrial, FIGO GRADE 1  . Kidney stone 6/13    H/O  . 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 Inner ear surgery      L ear d/t tumor and R ear d/t hole in ear drum  . Tonsillectomy      age 45  . Total abdominal hysterectomy  feb  2011    BSO, node dissection  . Endometrial biopsy     Family History  Problem Relation Age of Onset  . Asthma Mother   . Heart disease Mother   . Diabetes Mother   . Emphysema Mother   . Hypertension Mother   . Stroke Mother   . Prostate cancer Father   . Heart disease Sister   . Heart attack Brother   . Heart disease Brother   . Heart disease Sister   . Diabetes Sister    History  Substance Use Topics  . Smoking status: Never Smoker   . Smokeless tobacco: Never Used  . Alcohol Use: Yes   OB History   Grav Para Term Preterm Abortions TAB SAB Ect Mult Living   0              Review of Systems  Constitutional: Negative for fever and chills.  Respiratory: Negative for cough and shortness of breath.   Cardiovascular: Negative for chest pain.  Gastrointestinal: Positive for abdominal pain. Negative for nausea, vomiting, diarrhea, constipation and blood in stool.  Genitourinary: Positive for dysuria and flank pain. Negative for vaginal discharge, vaginal pain and menstrual problem.  Musculoskeletal: Negative for myalgias.    Allergies  Hydrocodone-acetaminophen and Codeine  Home Medications   Current Outpatient Rx  Name  Route  Sig  Dispense  Refill  .  aspirin 81 MG tablet   Oral   Take 162 mg by mouth every evening.          [name] buPROPion (WELLBUTRIN XL) 150 MG 24 hr tablet   Oral   Take 150 mg by mouth daily.          . cetirizine (ZYRTEC) 10 MG tablet   Oral   Take 10 mg by mouth daily.           [name] gabapentin (NEURONTIN) 400 MG capsule   Oral   Take 400-1,200 mg by mouth 3 (three) times daily. Take 1 in AM, 1 in afternoon and 3 at bedtime         . glimepiride (AMARYL) 2 MG tablet   Oral   Take 2-4 mg by mouth 2 (two) times daily. 2 tablets in the morning and 1 at night         . ibuprofen (ADVIL,MOTRIN) 200 MG tablet   Oral   Take 400 mg by mouth every 6 (six) hours as needed for pain.          . LamoTRIgine (LAMICTAL XR) 200 MG TB24    Oral   Take 200 mg by mouth daily.           [name] omeprazole (PRILOSEC) 40 MG capsule   Oral   Take 40 mg by mouth every morning.          . risperiDONE (RISPERDAL) 2 MG tablet   Oral   Take 2-4 mg by mouth 2 (two) times daily. 1 tablet in the morning and 2 tablets at night         . simvastatin (ZOCOR) 40 MG tablet   Oral   Take 40 mg by mouth every evening.         . traZODone (DESYREL) 100 MG tablet   Oral   Take 200 mg by mouth at bedtime.           BP 152/84  Pulse 60  Temp(Src) 98 F (36.7 C) (Oral)  Resp 18  SpO2 100% Physical Exam  Nursing note and vitals reviewed. Constitutional: She appears well-developed and well-nourished. No distress.  HENT:  Head: Normocephalic and atraumatic.  Neck: Neck supple.  Cardiovascular: Normal rate and regular rhythm.   Pulmonary/Chest: Effort normal and breath sounds normal. No respiratory distress. She has no wheezes. She has no rales.  Abdominal: Soft. She exhibits no distension. There is tenderness in the suprapubic area. There is no rebound, no guarding and no CVA tenderness.  Neurological: She is alert.  Skin: She is not diaphoretic.    ED Course   Procedures (including critical care time)  Labs Reviewed  URINALYSIS, ROUTINE W REFLEX MICROSCOPIC - Abnormal; Notable for the following:    Glucose, UA >1000 (*)    Hgb urine dipstick SMALL (*)    All other components within normal limits  POCT I-STAT, CHEM 8 - Abnormal; Notable for the following:    Glucose, Bld 306 (*)    Hemoglobin 16.3 (*)    HCT 48.0 (*)    All other components within normal limits  URINE MICROSCOPIC-ADD ON   Ct Abdomen Pelvis Wo Contrast  02/08/2013   *RADIOLOGY REPORT*  Clinical Data: Right flank pain, dysuria.  CT ABDOMEN AND PELVIS WITHOUT CONTRAST  Technique:  Multidetector CT imaging of the abdomen and pelvis was performed following the standard protocol without intravenous contrast.  Comparison: 08/22/2011  Findings: There is some  minimal linear scarring or subsegmental  atelectasis in the visualized lung bases.  Fatty infiltration of the liver without focal lesion.  Unremarkable uninfused evaluation of nondistended gallbladder, spleen, adrenal glands, pancreas, abdominal aorta.  There is mild right hydronephrosis and ureterectasis down to the level of a 3 mm calculus near the ureteral orifice.  1 mm calcification in the mid pole of the left renal collecting system. Renal parenchyma is unremarkable.  Stomach, small bowel, and colon are nondilated.  Normal appendix. Urinary bladder is incompletely distended.  Previous hysterectomy. No ascites.  No free air.  No adenopathy. Tiny umbilical hernia containing only mesenteric fat.  IMPRESSION:  1.  3 mm distal right ureteral calculus with mild right hydronephrosis. 2.  Left nephrolithiasis. 3.  Fatty liver.   Original Report Authenticated By: D. Andria Rhein, MD   1. Ureteral stone with hydronephrosis   2. Hyperglycemia     MDM  Afebrile nontoxic patient with dysuria, suprapubic pain x 1 week and right flank pain found to have 3mm right ureteral stone with mild hydronephrosis.  Has seen Dr Isabel Caprice in the past.  Pt is pain free at discharge.  Patient's blood sugar is elevated and she is mildly dehydrated - pt is tolerating PO fluid and states she will drink water at home.  States she ate cereal instead of making her normal diabetic breakfast and has not taken her diabetes medications because of her pain - she does have medications at home.  Hyperglycemia but doubt DKA.  Urine does not appear to be infected, culture will be sent.  States she is allergic to all medications with codeine including vicodin - they cause her to have difficulty breathing.  She has never taken percocet.  States she is usually given ibuprofen 800mg  for her pain.  Discussed all results with patient.  Pt given return precautions.  Pt verbalizes understanding and agrees with plan.     Trixie Dredge, PA-C 02/08/13 1414

## 2013-02-08 NOTE — ED Notes (Signed)
Pt c/o dysuria with rt sided flank pain that radiates down into her low abd.  States "I don't know whether I have a UTI or a kidney stone but it hurts".

## 2013-05-19 ENCOUNTER — Ambulatory Visit
Admission: RE | Admit: 2013-05-19 | Discharge: 2013-05-19 | Disposition: A | Discharge status: Home / Self Care | Payer: Medicaid Other | Source: Ambulatory Visit | Attending: Radiation Oncology | Admitting: Radiation Oncology

## 2013-05-19 ENCOUNTER — Other Ambulatory Visit (HOSPITAL_COMMUNITY)
Admission: RE | Admit: 2013-05-19 | Discharge: 2013-05-19 | Disposition: A | Discharge status: Home / Self Care | Payer: Medicaid Other | Source: Ambulatory Visit | Attending: Radiation Oncology | Admitting: Radiation Oncology

## 2013-05-19 ENCOUNTER — Encounter: Payer: Self-pay | Admitting: Radiation Oncology

## 2013-05-19 VITALS — BP 127/89 | HR 85 | Temp 98.5°F | Ht 63.0 in | Wt 253.1 lb

## 2013-05-19 DIAGNOSIS — Z01419 Encounter for gynecological examination (general) (routine) without abnormal findings: Secondary | ICD-10-CM | POA: Insufficient documentation

## 2013-05-19 DIAGNOSIS — C55 Malignant neoplasm of uterus, part unspecified: Secondary | ICD-10-CM

## 2013-05-19 MED ORDER — FLUCONAZOLE 150 MG PO TABS: 150.0000 mg | ORAL_TABLET | Freq: Every day | ORAL | Status: DC

## 2013-05-19 NOTE — Progress Notes (Signed)
Rhonda Higgins here for follow up after treatment for endometrial cancer.  She denies pain, vaginal and rectal bleeding.  She states she has a yeast infection and took the over the counter 1 day treatment and still have itching.  She does have fatigue which she thinks is from her medication.

## 2013-05-19 NOTE — Progress Notes (Signed)
Radiation Oncology         (336) [phone] ________________________________  Name: Rhonda Higgins MRN: [id number]  Date: 05/19/2013  DOB: Feb 04, 1971  Follow-Up Visit Note  CC: Rhonda Rily, MD  Rhonda Schimke, MD  Diagnosis:   Stage IB grade 1 endometrioid adenocarcinoma  Interval Since Last Radiation:  3 years and 7 months. Patient was treated with intracavitary brachytherapy treatments   Narrative:  The patient returns today for routine follow-up.  She is doing well. She denies any pelvic pain vaginal bleeding or rectal bleeding. She continues to use her vaginal dilator. Patient has noticed what she thinks is a yeast infection and was using over-the-counter medication with minimal benefit. She complains of itching in the perineum and vaginal region. She is also noticed a mild whitish discharge.                              ALLERGIES:  is allergic to hydrocodone-acetaminophen and codeine.  Meds: Current Outpatient Prescriptions  Medication Sig Dispense Refill  . aspirin 81 MG tablet Take 162 mg by mouth every evening.       [name] buPROPion (WELLBUTRIN XL) 150 MG 24 hr tablet Take 150 mg by mouth daily.       . cetirizine (ZYRTEC) 10 MG tablet Take 10 mg by mouth daily.        [name] gabapentin (NEURONTIN) 400 MG capsule Take 400-1,200 mg by mouth 3 (three) times daily. Take 1 in AM, 1 in afternoon and 3 at bedtime      . glimepiride (AMARYL) 2 MG tablet Take 2-4 mg by mouth 2 (two) times daily. 2 tablets in the morning and 1 at night      . ibuprofen (ADVIL,MOTRIN) 200 MG tablet Take 400 mg by mouth every 6 (six) hours as needed for pain.       . LamoTRIgine (LAMICTAL XR) 200 MG TB24 Take 200 mg by mouth daily.        [name] omeprazole (PRILOSEC) 40 MG capsule Take 40 mg by mouth every morning.       . risperiDONE (RISPERDAL) 2 MG tablet Take 2-4 mg by mouth 2 (two) times daily. 1 tablet in the morning and 2 tablets at night      . simvastatin (ZOCOR) 40 MG tablet Take 40 mg by mouth every  evening.      . traZODone (DESYREL) 100 MG tablet Take 200 mg by mouth at bedtime.       . fluconazole (DIFLUCAN) 150 MG tablet Take 1 tablet (150 mg total) by mouth daily.  2 tablet  0  . promethazine (PHENERGAN) 25 MG tablet Take 1 tablet (25 mg total) by mouth every 6 (six) hours as needed for nausea.  20 tablet  0   No current facility-administered medications for this encounter.    Physical Findings: The patient is in no acute distress. Patient is alert and oriented.  height is 5\' 3"  (1.6 m) and weight is 253 lb 1.6 oz (114.805 kg). Her temperature is 98.5 F (36.9 C). Her blood pressure is 127/89 and her pulse is 85. [name] No palpable supraclavicular or axillary adenopathy. The lungs are clear to auscultation. The heart has regular rhythm and rate. The abdomen is soft and nontender with normal bowel sounds. The inguinal areas are free of adenopathy. The pelvic examination the external genitalia are unremarkable. A speculum exam is performed. There is a whitish discharge noted in the vaginal  vault. No mucosal lesions are noted. A Pap smear was obtained of the proximal vagina. On bimanual and rectovaginal examination there no pelvic masses appreciated although exam is somewhat compromise in light of patient's anatomy. Rectal tone is good.    Radiographic Findings: No results found.  Impression:  No evidence of recurrence on clinical exam today, Pap smear pending  Plan:  Routine followup in radiation oncology in one year, in the interim the patient be seen by gynecologic oncology. The patient was given a prescription for Diflucan. She will stop taking her Zocor for the next several days in light of potential interaction between these medications.  _____________________________________  -----------------------------------  Rhonda Lade, PhD, MD

## 2013-05-28 ENCOUNTER — Telehealth: Payer: Self-pay | Admitting: Oncology

## 2013-05-28 NOTE — Telephone Encounter (Signed)
Called [name] to let her know that her pap smear results were good per Dr. Roselind Messier.

## 2013-07-28 ENCOUNTER — Other Ambulatory Visit: Payer: Self-pay

## 2013-07-28 DIAGNOSIS — Z1231 Encounter for screening mammogram for malignant neoplasm of breast: Secondary | ICD-10-CM

## 2013-08-19 ENCOUNTER — Ambulatory Visit
Admission: RE | Admit: 2013-08-19 | Discharge: 2013-08-19 | Disposition: A | Discharge status: Home / Self Care | Payer: Medicaid Other | Source: Ambulatory Visit

## 2013-08-19 DIAGNOSIS — Z1231 Encounter for screening mammogram for malignant neoplasm of breast: Secondary | ICD-10-CM

## 2013-10-30 ENCOUNTER — Ambulatory Visit (INDEPENDENT_AMBULATORY_CARE_PROVIDER_SITE_OTHER): Payer: Medicaid Other | Admitting: Pulmonary Disease

## 2013-10-30 ENCOUNTER — Encounter: Payer: Self-pay | Admitting: Pulmonary Disease

## 2013-10-30 ENCOUNTER — Encounter (INDEPENDENT_AMBULATORY_CARE_PROVIDER_SITE_OTHER): Payer: Self-pay

## 2013-10-30 VITALS — BP 118/72 | HR 74 | Temp 98.1°F | Ht 63.0 in | Wt 252.0 lb

## 2013-10-30 DIAGNOSIS — G4733 Obstructive sleep apnea (adult) (pediatric): Secondary | ICD-10-CM

## 2013-10-30 NOTE — Assessment & Plan Note (Signed)
The patient is having issues with CPAP compliance because of her mask fit. She did better when she had claw cushion to put under her mask, but has not had this in a while. We have talked about trying nasal pillows with a chin strap, and the patient is agreeable to this approach. I have also asked her to work aggressively on weight loss.

## 2013-10-30 NOTE — Patient Instructions (Signed)
Will try nasal pillows, and add a chin strap.  Please call if you are not doing well with this. Work on weight loss followup with me again in one year if doing well.

## 2013-10-30 NOTE — Progress Notes (Signed)
   Subjective:    Patient ID: Rhonda Higgins, female    DOB: Sep 26, 1970, 43 y.o.   MRN: [id number]  HPI The patient comes in today for followup of her known obstructive sleep apnea. She is been trying to be compliant with the device, but continues to have issues with mask fit. She had been using a cloth in her face under her mask and did better with this, but she has not gotten further supplies. She does feel better overall whenever she tries to sleep with the device more consistently. The patient's weight is also down 2 pounds from the last visit.   Review of Systems  Constitutional: Negative for fever and unexpected weight change.  HENT: Negative for congestion, dental problem, ear pain, nosebleeds, postnasal drip, rhinorrhea, sinus pressure, sneezing, sore throat and trouble swallowing.   Eyes: Negative for redness and itching.  Respiratory: Negative for cough, chest tightness, shortness of breath and wheezing.   Cardiovascular: Negative for palpitations and leg swelling.  Gastrointestinal: Negative for nausea and vomiting.  Genitourinary: Negative for dysuria.  Musculoskeletal: Negative for joint swelling.  Skin: Negative for rash.  Neurological: Negative for headaches.  Hematological: Does not bruise/bleed easily.  Psychiatric/Behavioral: Negative for dysphoric mood. The patient is not nervous/anxious.        Objective:   Physical Exam Obese female in no acute distress Nose without purulence or discharge noted No skin breakdown or pressure necrosis from the CPAP mask Neck without lymphadenopathy or thyromegaly Lower extremities with edema noted, no cyanosis Alert and oriented, moves all 4 extremities.       Assessment & Plan:

## 2013-10-31 ENCOUNTER — Ambulatory Visit: Payer: Medicaid Other | Admitting: Pulmonary Disease

## 2013-11-06 ENCOUNTER — Ambulatory Visit: Payer: Medicaid Other | Admitting: Pulmonary Disease

## 2013-11-14 ENCOUNTER — Telehealth: Payer: Self-pay | Admitting: Pulmonary Disease

## 2013-11-14 NOTE — Telephone Encounter (Signed)
LMTCB

## 2013-11-17 NOTE — Telephone Encounter (Signed)
I spoke with Leafy Ro at [facility] and she states the order was received and sent to [facility] dept, but she does not see where anything has been set-up. She is going to speak with [facility] and have them contact the pt today.  [id number] to advise the pt. [place] Bing, CMA

## 2013-11-19 NOTE — Telephone Encounter (Signed)
Pt aware that [facility] has received order and it has been sent to [facility]. Pt to contact DME if have not heard anything by noon today.  Nothing further needed.

## 2014-01-09 ENCOUNTER — Encounter: Payer: Self-pay | Admitting: Gynecologic Oncology

## 2014-01-09 ENCOUNTER — Ambulatory Visit: Payer: Medicaid Other | Attending: Gynecologic Oncology | Admitting: Gynecologic Oncology

## 2014-01-09 VITALS — BP 146/79 | HR 79 | Temp 98.1°F | Resp 18 | Ht 63.0 in | Wt 247.4 lb

## 2014-01-09 DIAGNOSIS — C549 Malignant neoplasm of corpus uteri, unspecified: Secondary | ICD-10-CM | POA: Insufficient documentation

## 2014-01-09 DIAGNOSIS — E119 Type 2 diabetes mellitus without complications: Secondary | ICD-10-CM | POA: Diagnosis not present

## 2014-01-09 DIAGNOSIS — E785 Hyperlipidemia, unspecified: Secondary | ICD-10-CM | POA: Diagnosis not present

## 2014-01-09 DIAGNOSIS — F329 Major depressive disorder, single episode, unspecified: Secondary | ICD-10-CM | POA: Diagnosis not present

## 2014-01-09 DIAGNOSIS — F3289 Other specified depressive episodes: Secondary | ICD-10-CM | POA: Diagnosis not present

## 2014-01-09 DIAGNOSIS — Z90710 Acquired absence of both cervix and uterus: Secondary | ICD-10-CM | POA: Diagnosis not present

## 2014-01-09 DIAGNOSIS — K219 Gastro-esophageal reflux disease without esophagitis: Secondary | ICD-10-CM | POA: Diagnosis not present

## 2014-01-09 DIAGNOSIS — Z9221 Personal history of antineoplastic chemotherapy: Secondary | ICD-10-CM

## 2014-01-09 DIAGNOSIS — Z8542 Personal history of malignant neoplasm of other parts of uterus: Secondary | ICD-10-CM

## 2014-01-09 NOTE — Progress Notes (Signed)
Follow-up Note: Gyn-Onc   Rhonda Higgins 43 y.o. female  CC: Surveillance Endometrial cancer NED   Assessment/Plan:  This is a 43 y.o. year old with a stage IB grade 1 endometrioid adenocarcinoma. She underwent robotic hysterectomy bilateral salpingo-oophorectomy pelvic and para-aortic lymph node dissection and adjuvant radiotherapy in February 2011. There is no evidence of disease present.  Followup with Dr. Sondra Come  in 6 months.  Followup with her OBGYN or Gyn Onc for well woman checks annually thereafter when she she would be considered s/p 5 year cancer surveillance.  HPI: This is a 43 y.o. who underwent a robotic hysterectomy, bilateral salpingo-oophorectomy, bilateral pelvic and periaortic lymph nodes. Final pathology was notable for grade 1 endometrial cancer invading up to 90% of the uterus in the area of the fundus. There was no lymphovascular space invasion. No evidence of metastatic disease to the adnexa. She received adjuvant pelvic radiotherapy. Postoperative course was complicated by an abdominal wall hematoma and residual abdominal pain. [name] states that she is doing very well at this time. She reports hot flashes approximately 4 times per day. Denies awakening. She uses her dilator almost every other day. She denies dysparenia and is sexually active with her wife without concerns. Denies diarrhea or constipation. Reports weight gain. Denies cough or headache.    Interval History: Pap 05/2012 wnl.  Feels well, reports she married her partner in the past year.  Past Surgical Hx:  Past Surgical History  Procedure Laterality Date  . Inner ear surgery      L ear d/t tumor and R ear d/t hole in ear drum  . Tonsillectomy      age 21  . Total abdominal hysterectomy  feb 2011    BSO, node dissection  . Endometrial biopsy      Past Medical Hx:  Past Medical History  Diagnosis Date  . Allergic rhinitis   . Hyperlipidemia   . Diabetes mellitus   . Personality  disorder   . Overactive bladder   . UTI (urinary tract infection)   . Depression   . GERD (gastroesophageal reflux disease)   . Non-insulin dependent type 2 diabetes mellitus   . Anemia   . Elevated cholesterol   . Right lower quadrant pain   . Polyphagia   . Uterine cancer   . Adenocarcinoma     endometrial, FIGO GRADE 1  . Kidney stone 6/13    H/O  . Radiation 10/13/2009    Endometrioid adenocarcinoma    Family Hx:  Family History  Problem Relation Age of Onset  . Asthma Mother   . Heart disease Mother   . Diabetes Mother   . Emphysema Mother   . Hypertension Mother   . Stroke Mother   . Prostate cancer Father   . Heart disease Sister   . Heart attack Brother   . Heart disease Brother   . Heart disease Sister   . Diabetes Sister     Review of Systems:  Constitutional  Feels well good appetite moderate amount of energy. Skin No rash, sores, jaundice, itching, dryness,  Cardiovascular  No chest pain, occasional shortness of breath does be evaluated by a cardiologist.  Pulmonary  No cough or wheeze.  Gastro Intestinal  No nausea, vomitting, or diarrhoea. No bright red blood per rectum, no abdominal pain, change in bowel movement, or constipation.  Genito Urinary  No frequency, urgency, dysuria, no vaginal bleeding or discharge. Denies incontinence Musculo Skeletal  No myalgia, arthralgia, joint swelling  or pain  Neurologic  No weakness, numbness, change in gait,  Psychology  No depression, anxiety, insomnia.     Vitals:  Blood pressure 146/79, pulse 79, temperature 98.1 F (36.7 C), temperature source Oral, resp. rate 18, height 5\' 3"  (1.6 m), weight 247 lb 6.4 oz (112.22 kg).  Physical Exam: WD female in no acute distress Neck  Supple without any enlargements.  Lymph node survey. No cervical supraclavicular cervical or inguinal adenopathy Cardiovascular  Pulse normal rate, regularity and rhythm. S1 and S2 normal. Lungs  Clear to auscultation  bilateraly, without wheezes/crackles/rhonchi. Good air movement.  Psychiatry  Alert and oriented to person, place, and time  Back No CVA tenderness Abdomen  Normoactive bowel sounds, abdomen soft, non-tender and obese. Surgical  sites intact without evidence of hernia or masses Genito Urinary  Vulva/vagina: Normal external female genitalia.  No lesions.   Bladder/urethra:  No lesions or masses  Vagina: Atrophic without any lesions masses or cul-de-sac nodularity. Narrowing/tapering of upper vagina consistent with post-radiation changes. Rectal  Good tone, no masses no cul de sac nodularity.  Extremities  No bilateral cyanosis, clubbing or edema.     Donaciano Eva, MD., PhD. 01/09/2014, 10:36 AM

## 2014-01-09 NOTE — Patient Instructions (Signed)
Follow up in 6 months with our office or you GYN.

## 2014-05-21 ENCOUNTER — Other Ambulatory Visit (HOSPITAL_COMMUNITY)
Admission: RE | Admit: 2014-05-21 | Discharge: 2014-05-21 | Disposition: A | Discharge status: Home / Self Care | Payer: Medicaid Other | Source: Ambulatory Visit | Attending: Radiation Oncology | Admitting: Radiation Oncology

## 2014-05-21 ENCOUNTER — Encounter: Payer: Self-pay | Admitting: Radiation Oncology

## 2014-05-21 ENCOUNTER — Ambulatory Visit
Admission: RE | Admit: 2014-05-21 | Discharge: 2014-05-21 | Disposition: A | Discharge status: Home / Self Care | Payer: Medicaid Other | Source: Ambulatory Visit | Attending: Radiation Oncology | Admitting: Radiation Oncology

## 2014-05-21 VITALS — BP 117/65 | HR 84 | Temp 98.8°F | Resp 10 | Wt 250.6 lb

## 2014-05-21 DIAGNOSIS — Z01411 Encounter for gynecological examination (general) (routine) with abnormal findings: Secondary | ICD-10-CM | POA: Insufficient documentation

## 2014-05-21 DIAGNOSIS — C55 Malignant neoplasm of uterus, part unspecified: Secondary | ICD-10-CM

## 2014-05-21 NOTE — Progress Notes (Signed)
She is currently in no pain. Reports having an overactive bladder, denies pain with urination.  No bowel or vaginal abnormalities

## 2014-05-21 NOTE — Progress Notes (Signed)
Radiation Oncology         (336) [phone] ________________________________  Name: Rhonda Higgins MRN: [id number]  Date: 05/21/2014  DOB: 1971-02-27  Follow-Up Visit Note  CC: Andria Frames, MD  Janie Morning, MD    ICD-9-CM ICD-10-CM   1. Uterine cancer 179 C55 Cytology - PAP    Diagnosis:   Stage IB, grade 1 endometrioid adenocarcinoma  Interval Since Last Radiation:  4 years and 8 months   Narrative:  The patient returns today for routine follow-up. She is doing well and without complaints other than an overactive bladder. She denies any hematuria or dysuria. She denies any strong odor to her urine. Patient denies any vaginal bleeding pelvic pain or rectal bleeding. She was seen by gynecologic oncology approximately 6 months ago with good report.                              ALLERGIES:  is allergic to hydrocodone-acetaminophen and codeine.  Meds: Current Outpatient Prescriptions  Medication Sig Dispense Refill  . metFORMIN (GLUCOPHAGE) 500 MG tablet Take 500 mg by mouth 2 (two) times daily with a meal.    . aspirin 81 MG tablet Take 162 mg by mouth every evening.     [name] buPROPion (WELLBUTRIN XL) 150 MG 24 hr tablet Take 150 mg by mouth daily.     . cetirizine (ZYRTEC) 10 MG tablet Take 10 mg by mouth daily.      [name] escitalopram (LEXAPRO) 5 MG tablet Take 5 mg by mouth daily.    [name] gabapentin (NEURONTIN) 400 MG capsule Take 400-1,200 mg by mouth 3 (three) times daily. Take 1 in AM, 1 in afternoon and 3 at bedtime    . glimepiride (AMARYL) 2 MG tablet Take 2-4 mg by mouth 2 (two) times daily. 2 tablets in the morning and 1 at night    . ibuprofen (ADVIL,MOTRIN) 200 MG tablet Take 400 mg by mouth every 6 (six) hours as needed for pain.     . Multiple Vitamin (MULTIVITAMIN) tablet Take 1 tablet by mouth daily.    [name] omeprazole (PRILOSEC) 40 MG capsule Take 40 mg by mouth every morning.     . risperiDONE (RISPERDAL) 2 MG tablet Take 2-4 mg by mouth 2 (two) times daily. 1 tablet in  the morning and 2 tablets at night    . simvastatin (ZOCOR) 40 MG tablet Take 40 mg by mouth every evening.    . traZODone (DESYREL) 100 MG tablet Take 200 mg by mouth at bedtime.      No current facility-administered medications for this encounter.    Physical Findings: The patient is in no acute distress. Patient is alert and oriented.  weight is 250 lb 9.6 oz (113.671 kg). Her oral temperature is 98.8 F (37.1 C). Her blood pressure is 117/65 and her pulse is 84. Her respiration is 10 and oxygen saturation is 98%. .  No palpable supraclavicular adenopathy. Lungs are clear to auscultation. The heart has a regular rhythm and rate. The abdomen is soft and nontender with normal bowel sounds. No inguinal adenopathy appreciated. On pelvic examination the external genitalia are unremarkable. A speculum exam is performed. There no mucosal lesions noted in the vaginal vault. A Pap smear was obtained of the proximal vagina. The vaginal vault is narrowed the has noticed on previous exams. On bimanual and rectovaginal examination there no pelvic masses appreciated although exam is somewhat compromised in light of the  patient's body habitus.  Lab Findings: Lab Results  Component Value Date   WBC 8.2 08/23/2011   HGB 16.3* 02/08/2013   HCT 48.0* 02/08/2013   MCV 77.8* 08/23/2011   PLT 167 08/23/2011    Radiographic Findings: No results found.  Impression: no evidence of recurrence on clinical exam today, Pap smear pending  Plan:  The patient was congratulated on her successful outcome. She will return for yearly examinations with her gynecologist next year.   ____________________________________ Blair Promise, MD

## 2014-05-22 LAB — CYTOLOGY - PAP

## 2014-05-25 ENCOUNTER — Telehealth: Payer: Self-pay | Admitting: Oncology

## 2014-05-25 NOTE — Telephone Encounter (Signed)
Called Tobie and notified her of the good results on her pap smear per Dr. Sondra Come.  Mardene verbalized understanding.

## 2014-07-19 ENCOUNTER — Encounter (HOSPITAL_COMMUNITY): Payer: Self-pay | Admitting: Emergency Medicine

## 2014-07-19 ENCOUNTER — Observation Stay (HOSPITAL_COMMUNITY)
Admission: EM | Admit: 2014-07-19 | Discharge: 2014-07-20 | Disposition: A | Discharge status: Home / Self Care | Payer: Medicaid Other | Attending: Family Medicine | Admitting: Family Medicine

## 2014-07-19 ENCOUNTER — Emergency Department (HOSPITAL_COMMUNITY): Payer: Medicaid Other

## 2014-07-19 DIAGNOSIS — J309 Allergic rhinitis, unspecified: Secondary | ICD-10-CM | POA: Insufficient documentation

## 2014-07-19 DIAGNOSIS — I491 Atrial premature depolarization: Secondary | ICD-10-CM | POA: Diagnosis not present

## 2014-07-19 DIAGNOSIS — R079 Chest pain, unspecified: Principal | ICD-10-CM | POA: Insufficient documentation

## 2014-07-19 DIAGNOSIS — G4733 Obstructive sleep apnea (adult) (pediatric): Secondary | ICD-10-CM | POA: Insufficient documentation

## 2014-07-19 DIAGNOSIS — K219 Gastro-esophageal reflux disease without esophagitis: Secondary | ICD-10-CM | POA: Insufficient documentation

## 2014-07-19 DIAGNOSIS — Z90710 Acquired absence of both cervix and uterus: Secondary | ICD-10-CM | POA: Diagnosis not present

## 2014-07-19 DIAGNOSIS — I10 Essential (primary) hypertension: Secondary | ICD-10-CM | POA: Insufficient documentation

## 2014-07-19 DIAGNOSIS — E785 Hyperlipidemia, unspecified: Secondary | ICD-10-CM | POA: Diagnosis not present

## 2014-07-19 DIAGNOSIS — E119 Type 2 diabetes mellitus without complications: Secondary | ICD-10-CM

## 2014-07-19 DIAGNOSIS — E876 Hypokalemia: Secondary | ICD-10-CM | POA: Insufficient documentation

## 2014-07-19 DIAGNOSIS — F609 Personality disorder, unspecified: Secondary | ICD-10-CM | POA: Insufficient documentation

## 2014-07-19 DIAGNOSIS — Z8542 Personal history of malignant neoplasm of other parts of uterus: Secondary | ICD-10-CM | POA: Diagnosis not present

## 2014-07-19 DIAGNOSIS — N3281 Overactive bladder: Secondary | ICD-10-CM | POA: Insufficient documentation

## 2014-07-19 DIAGNOSIS — K210 Gastro-esophageal reflux disease with esophagitis: Secondary | ICD-10-CM

## 2014-07-19 DIAGNOSIS — Z885 Allergy status to narcotic agent status: Secondary | ICD-10-CM | POA: Insufficient documentation

## 2014-07-19 HISTORY — PX: TRANSTHORACIC ECHOCARDIOGRAM: SHX275

## 2014-07-19 LAB — BASIC METABOLIC PANEL
Anion gap: 8 (ref 5–15)
BUN: 11 mg/dL (ref 6–23)
CO2: 25 mmol/L (ref 19–32)
Calcium: 8.5 mg/dL (ref 8.4–10.5)
Chloride: 106 mmol/L (ref 96–112)
Creatinine, Ser: 0.62 mg/dL (ref 0.50–1.10)
GFR calc Af Amer: >90 mL/min (ref >90)
GFR calc non Af Amer: >90 mL/min (ref >90)
Glucose, Bld: 197 mg/dL — ABNORMAL HIGH (ref 70–99)
Potassium: 3.7 mmol/L (ref 3.5–5.1)
Sodium: 139 mmol/L (ref 135–145)

## 2014-07-19 LAB — CBC
HCT: 44 % (ref 36.0–46.0)
Hemoglobin: 14.3 g/dL (ref 12.0–15.0)
MCH: 27.8 pg (ref 26.0–34.0)
MCHC: 32.5 g/dL (ref 30.0–36.0)
MCV: 85.4 fL (ref 78.0–100.0)
Platelets: 134 10*3/uL — ABNORMAL LOW (ref 150–400)
RBC: 5.15 MIL/uL — ABNORMAL HIGH (ref 3.87–5.11)
RDW: 13.3 % (ref 11.5–15.5)
WBC: 5.4 10*3/uL (ref 4.0–10.5)

## 2014-07-19 LAB — I-STAT TROPONIN, ED: Troponin i, poc: 0 ng/mL (ref 0.00–0.08)

## 2014-07-19 LAB — LIPASE, BLOOD: Lipase: 31 U/L (ref 11–59)

## 2014-07-19 LAB — D-DIMER, QUANTITATIVE (NOT AT ARMC): D-Dimer, Quant: <0.27 ug/mL-FEU (ref 0.00–0.48)

## 2014-07-19 IMAGING — DX DG CHEST 1V PORT
1 series · 1 of 1 positions shown · non-contrast
Comparison: None.

CLINICAL DATA: Acute onset of central chest pain. Initial
encounter.

EXAM:
PORTABLE CHEST - 1 VIEW

[chest ap]
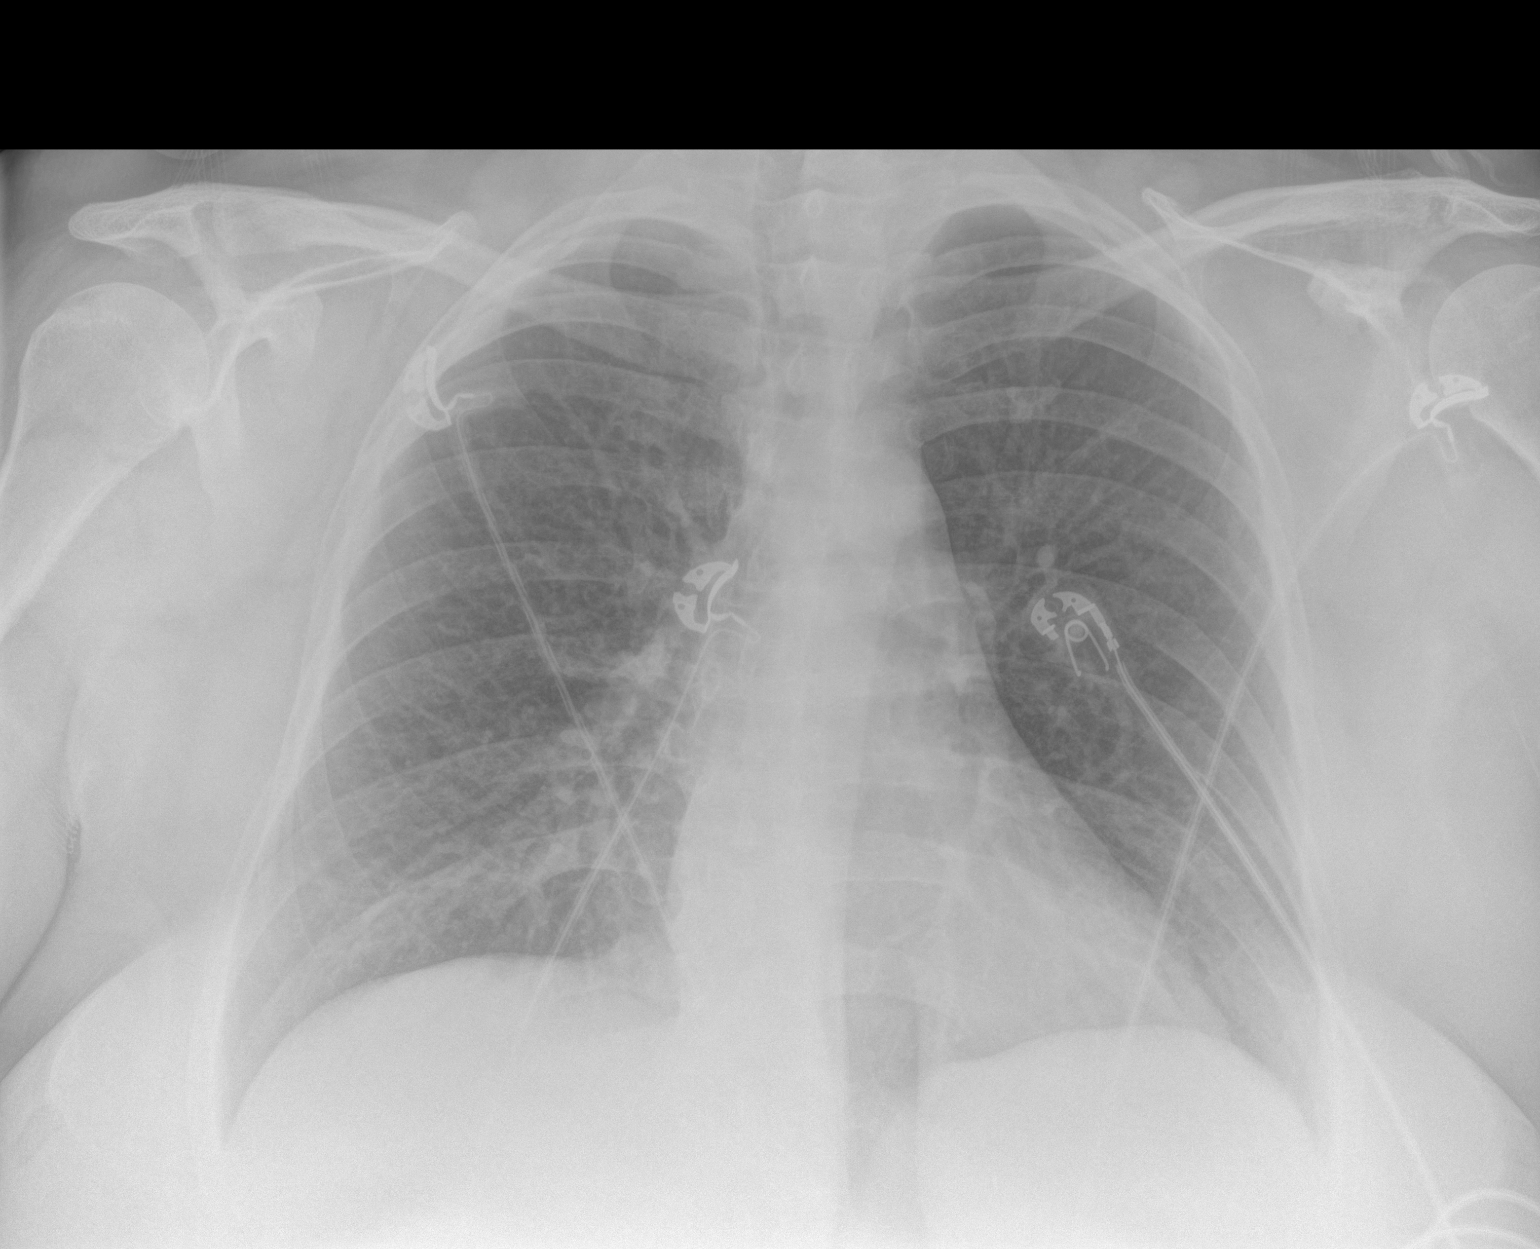

[1 of 1 positions shown; findings below may reference images not displayed]

FINDINGS: The lungs are well-aerated. Mild vascular congestion is noted. There
is no evidence of focal opacification, pleural effusion or
pneumothorax. Bibasilar densities are thought to reflect overlying
soft tissues.

The cardiomediastinal silhouette is within normal limits. No acute
osseous abnormalities are seen.
IMPRESSION: Mild vascular congestion noted; lungs remain grossly clear.

## 2014-07-19 NOTE — ED Notes (Signed)
Pt c/o intermittent chest pain since 4pm today. Pt has never experienced pain like this before. Pt sts "It feels like my chest needs to be cracked." Pt sts the pain radiates to her back. Denies SOB, N/V. Sts that she did get dizzy once, walking in from the parking lot. Pt sts the pain originally lasted 20 minutes then went away. Pt sts that now it comes about every 15 minutes and lasts about 5 minutes.  A&Ox4. Ambulatory to triage. Pt has significant family hx of MI and cardiac issues.

## 2014-07-19 NOTE — ED Provider Notes (Signed)
CSN: [id number]     Arrival date & time 07/19/14  2044 History   First MD Initiated Contact with Patient 07/19/14 2157     Chief Complaint  Patient presents with  . Chest Pain  . Back Pain    (Consider location/radiation/quality/duration/timing/severity/associated sxs/prior Treatment) HPI Comments: Patient is a 44 year old female with a history of diabetes mellitus, hyperlipidemia, esophageal reflux, and anemia. She presents to the emergency department for further evaluation of chest pain. Patient states that chest pain began at 1600 today. She states that pain is dull and located in her central chest. Pain radiates through to her back. She states the pain is present every 1-2 minutes. She denies any modifying factors of her symptoms. Pain waxes and wanes in severity when present. She denies any associated syncope or near-syncope, neck pain, extremity numbness/weakness, leg swelling, nausea, vomiting, shortness of breath, and diaphoresis. Patient denies any personal history of ACS, but does have a significant family history for this. Patient states that her mother died at the age of 19 from a heart attack and her brother died at 79 from an MI. She has 2 sisters who have had a triple bypass as well as another sister who has had a double bypass.  PCP - Dr. Ronnald Ramp Cardiologist - Dr. Terrence Dupont  Patient is a 44 y.o. female presenting with chest pain and back pain. The history is provided by the patient. No language interpreter was used.  Chest Pain Associated symptoms: back pain   Associated symptoms: no shortness of breath   Back Pain Associated symptoms: chest pain     Past Medical History  Diagnosis Date  . Allergic rhinitis   . Hyperlipidemia   . Diabetes mellitus   . Personality disorder   . Overactive bladder   . UTI (urinary tract infection)   . Depression   . GERD (gastroesophageal reflux disease)   . Non-insulin dependent type 2 diabetes mellitus   . Anemia   . Elevated  cholesterol   . Right lower quadrant pain   . Polyphagia   . Uterine cancer   . Adenocarcinoma     endometrial, FIGO GRADE 1  . Kidney stone 6/13    H/O  . 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 Inner ear surgery      L ear d/t tumor and R ear d/t hole in ear drum  . Tonsillectomy      age 76  . Total abdominal hysterectomy  feb 2011    BSO, node dissection  . Endometrial biopsy     Family History  Problem Relation Age of Onset  . Asthma Mother   . Heart disease Mother   . Diabetes Mother   . Emphysema Mother   . Hypertension Mother   . Stroke Mother   . Prostate cancer Father   . Heart disease Sister   . Heart attack Brother   . Heart disease Brother   . Heart disease Sister   . Diabetes Sister    History  Substance Use Topics  . Smoking status: Never Smoker   . Smokeless tobacco: Never Used  . Alcohol Use: Yes   OB History    Gravida Para Term Preterm AB TAB SAB Ectopic Multiple Living   0               Review of Systems  Respiratory: Negative for shortness of breath.   Cardiovascular: Positive for chest pain.  Musculoskeletal: Positive for back pain.  All other systems reviewed and are negative.   Allergies  Hydrocodone-acetaminophen and Codeine  Home Medications   Prior to Admission medications   Medication Sig Start Date End Date Taking? Authorizing Provider  aspirin 81 MG tablet Take 162 mg by mouth every evening.    Yes Historical Provider, MD  buPROPion (WELLBUTRIN XL) 150 MG 24 hr tablet Take 150 mg by mouth at bedtime.    Yes Historical Provider, MD  cetirizine (ZYRTEC) 10 MG tablet Take 10 mg by mouth at bedtime.    Yes Historical Provider, MD  escitalopram (LEXAPRO) 5 MG tablet Take 5 mg by mouth at bedtime.    Yes Historical Provider, MD  gabapentin (NEURONTIN) 400 MG capsule Take 400-1,200 mg by mouth 3 (three) times daily. Take 1 Tablet by mouth in the am, Take 1 Tablet  by mouth in the afternoon and Take 3 Tablets by mouth at bedtime.   Yes Historical Provider, MD  glimepiride (AMARYL) 2 MG tablet Take 2 mg by mouth daily with breakfast.    Yes Historical Provider, MD  ibuprofen (ADVIL,MOTRIN) 200 MG tablet Take 400 mg by mouth every 6 (six) hours as needed for moderate pain (pain).    Yes Historical Provider, MD  metFORMIN (GLUCOPHAGE) 850 MG tablet Take 850 mg by mouth 2 (two) times daily with a meal.   Yes Historical Provider, MD  Multiple Vitamin (MULTIVITAMIN) tablet Take 1 tablet by mouth daily.   Yes Historical Provider, MD  omeprazole (PRILOSEC) 40 MG capsule Take 40 mg by mouth at bedtime.    Yes Historical Provider, MD  risperiDONE (RISPERDAL) 2 MG tablet Take 2-4 mg by mouth 2 (two) times daily. Take 2 Tablets by mouth in the morning and Take 1 Tablet by mouth at night.   Yes Historical Provider, MD  simvastatin (ZOCOR) 40 MG tablet Take 40 mg by mouth every evening.   Yes Historical Provider, MD  traZODone (DESYREL) 100 MG tablet Take 200 mg by mouth at bedtime as needed for sleep.     Historical Provider, MD   BP 166/88 mmHg  Pulse 98  Temp(Src) 97.9 F (36.6 C) (Oral)  Resp 16  SpO2 97%   Physical Exam  Constitutional: She is oriented to person, place, and time. She appears well-developed and well-nourished. No distress.  Nontoxic/nonseptic appearing  HENT:  Head: Normocephalic and atraumatic.  Eyes: Conjunctivae and EOM are normal. No scleral icterus.  Neck: Normal range of motion.  Cardiovascular: Normal rate, regular rhythm and intact distal pulses.   Pulmonary/Chest: Effort normal and breath sounds normal. No respiratory distress. She has no wheezes. She has no rales.  Respirations even and unlabored. Lungs clear.  Abdominal: Soft. She exhibits no distension. There is no tenderness. There is no rebound and no guarding.  Soft, obese abdomen. No masses or peritoneal signs.  Musculoskeletal: Normal range of motion.  Neurological: She is  alert and oriented to person, place, and time. She exhibits normal muscle tone. Coordination normal.  GCS 15. Patient moves extremities without ataxia.  Skin: Skin is warm and dry. No rash noted. She is not diaphoretic. No erythema. No pallor.  Psychiatric: She has a normal mood and affect. Her behavior is normal.  Nursing note and vitals reviewed.   ED Course  Procedures (including critical care time) Labs Review Labs Reviewed  CBC - Abnormal; Notable for the following:    RBC 5.15 (*)    Platelets 134 (*)    All other  components within normal limits  BASIC METABOLIC PANEL - Abnormal; Notable for the following:    Glucose, Bld 197 (*)    All other components within normal limits  D-DIMER, QUANTITATIVE  LIPASE, BLOOD  I-STAT TROPOININ, ED    Imaging Review Dg Chest Port 1 View  07/19/2014   CLINICAL DATA:  Acute onset of central chest pain. Initial encounter.  EXAM: PORTABLE CHEST - 1 VIEW  COMPARISON:  None.  FINDINGS: The lungs are well-aerated. Mild vascular congestion is noted. There is no evidence of focal opacification, pleural effusion or pneumothorax. Bibasilar densities are thought to reflect overlying soft tissues.  The cardiomediastinal silhouette is within normal limits. No acute osseous abnormalities are seen.  IMPRESSION: Mild vascular congestion noted; lungs remain grossly clear.   Electronically Signed   By: Garald Balding M.D.   On: 07/19/2014 21:32     EKG Interpretation   Date/Time:  Sunday July 19 2014 21:00:21 EST Ventricular Rate:  95 PR Interval:  156 QRS Duration: 83 QT Interval:  348 QTC Calculation: 437 R Axis:   73 Text Interpretation:  Sinus rhythm Atrial premature complex Low voltage,  precordial leads Baseline wander in lead(s) I III aVL aVF V1 Confirmed by  ALLEN  MD, ANTHONY ([id number]) on 07/19/2014 11:25:50 PM      MDM   Final diagnoses:  Chest pain, unspecified chest pain type    44 year old female presents to the emergency department  for further evaluation of chest pain. Patient with very significant family history for ACS. Patient herself has a history of hyperlipidemia and diabetes mellitus. Patient's cardiac workup today is unremarkable. Doubt dissection or pulmonary embolism. Patient has normal mediastinal contours as well as a normal d-dimer today. Her initial troponin is 0.0. Given her heart score of 4, believe patient is a good candidate for admission for ACS rule out. Case discussed with Dr. Blaine Hamper of [facility] who will evaluate the patient for admission. Temp admit orders placed.   Filed Vitals:   07/19/14 2053  BP: 166/88  Pulse: 98  Temp: 97.9 F (36.6 C)  TempSrc: Oral  Resp: 16  SpO2: 97%       Antonietta Breach, PA-C 07/20/14 0014  Leota Jacobsen, MD 07/22/14 [phone]

## 2014-07-20 ENCOUNTER — Encounter (HOSPITAL_COMMUNITY): Payer: Self-pay | Admitting: *Deleted

## 2014-07-20 DIAGNOSIS — R079 Chest pain, unspecified: Secondary | ICD-10-CM | POA: Diagnosis not present

## 2014-07-20 DIAGNOSIS — E785 Hyperlipidemia, unspecified: Secondary | ICD-10-CM

## 2014-07-20 DIAGNOSIS — G4733 Obstructive sleep apnea (adult) (pediatric): Secondary | ICD-10-CM | POA: Diagnosis not present

## 2014-07-20 DIAGNOSIS — E119 Type 2 diabetes mellitus without complications: Secondary | ICD-10-CM

## 2014-07-20 DIAGNOSIS — J309 Allergic rhinitis, unspecified: Secondary | ICD-10-CM | POA: Diagnosis not present

## 2014-07-20 DIAGNOSIS — R072 Precordial pain: Secondary | ICD-10-CM

## 2014-07-20 LAB — COMPREHENSIVE METABOLIC PANEL
ALT: 80 U/L — ABNORMAL HIGH (ref 0–35)
AST: 57 U/L — ABNORMAL HIGH (ref 0–37)
Albumin: 3.8 g/dL (ref 3.5–5.2)
Alkaline Phosphatase: 100 U/L (ref 39–117)
Anion gap: 5 (ref 5–15)
BUN: 10 mg/dL (ref 6–23)
CO2: 27 mmol/L (ref 19–32)
Calcium: 8.2 mg/dL — ABNORMAL LOW (ref 8.4–10.5)
Chloride: 107 mmol/L (ref 96–112)
Creatinine, Ser: 0.61 mg/dL (ref 0.50–1.10)
GFR calc Af Amer: >90 mL/min (ref >90)
GFR calc non Af Amer: >90 mL/min (ref >90)
Glucose, Bld: 91 mg/dL (ref 70–99)
Potassium: 3.3 mmol/L — ABNORMAL LOW (ref 3.5–5.1)
Sodium: 139 mmol/L (ref 135–145)
Total Bilirubin: 0.5 mg/dL (ref 0.3–1.2)
Total Protein: 6.5 g/dL (ref 6.0–8.3)

## 2014-07-20 LAB — RAPID URINE DRUG SCREEN, HOSP PERFORMED
Amphetamines: NOT DETECTED
Barbiturates: NOT DETECTED
Benzodiazepines: NOT DETECTED
Cocaine: NOT DETECTED
Opiates: NOT DETECTED
Tetrahydrocannabinol: NOT DETECTED

## 2014-07-20 LAB — TROPONIN I
Troponin I: <0.03 ng/mL (ref <0.031)
Troponin I: <0.03 ng/mL (ref <0.031)
Troponin I: <0.03 ng/mL (ref <0.031)

## 2014-07-20 LAB — BRAIN NATRIURETIC PEPTIDE: B Natriuretic Peptide: 15.1 pg/mL (ref 0.0–100.0)

## 2014-07-20 LAB — GLUCOSE, CAPILLARY
Glucose-Capillary: 116 mg/dL — ABNORMAL HIGH (ref 70–99)
Glucose-Capillary: 152 mg/dL — ABNORMAL HIGH (ref 70–99)
Glucose-Capillary: 231 mg/dL — ABNORMAL HIGH (ref 70–99)

## 2014-07-20 LAB — CBC
HCT: 42.7 % (ref 36.0–46.0)
Hemoglobin: 14.3 g/dL (ref 12.0–15.0)
MCH: 28.5 pg (ref 26.0–34.0)
MCHC: 33.5 g/dL (ref 30.0–36.0)
MCV: 85.2 fL (ref 78.0–100.0)
Platelets: 124 10*3/uL — ABNORMAL LOW (ref 150–400)
RBC: 5.01 MIL/uL (ref 3.87–5.11)
RDW: 13.4 % (ref 11.5–15.5)
WBC: 6.2 10*3/uL (ref 4.0–10.5)

## 2014-07-20 LAB — LIPID PANEL
Cholesterol: 127 mg/dL (ref 0–200)
HDL: 33 mg/dL — ABNORMAL LOW (ref >39)
LDL Cholesterol: 75 mg/dL (ref 0–99)
Total CHOL/HDL Ratio: 3.8 RATIO
Triglycerides: 97 mg/dL (ref <150)
VLDL: 19 mg/dL (ref 0–40)

## 2014-07-20 LAB — PROTIME-INR
INR: 0.96 (ref 0.00–1.49)
Prothrombin Time: 12.9 seconds (ref 11.6–15.2)

## 2014-07-20 MED ORDER — CARVEDILOL 3.125 MG PO TABS: 3.1250 mg | ORAL_TABLET | Freq: Two times a day (BID) | ORAL | Status: DC

## 2014-07-20 MED ORDER — SODIUM CHLORIDE 0.9 % IJ SOLN: 3.0000 mL | Freq: Two times a day (BID) | INTRAMUSCULAR | Status: DC

## 2014-07-20 MED ORDER — INSULIN ASPART 100 UNIT/ML ~~LOC~~ SOLN
0.0000 [IU] | Freq: Three times a day (TID) | SUBCUTANEOUS | Status: DC
  Administered 2014-07-20: 2 [IU] via SUBCUTANEOUS

## 2014-07-20 MED ORDER — HEPARIN SODIUM (PORCINE) 5000 UNIT/ML IJ SOLN
5000.0000 [IU] | Freq: Three times a day (TID) | INTRAMUSCULAR | Status: DC
  Administered 2014-07-20 (×2): 5000 [IU] via SUBCUTANEOUS
  Filled 2014-07-20 (×4): qty 1

## 2014-07-20 MED ORDER — ESCITALOPRAM OXALATE 5 MG PO TABS
5.0000 mg | ORAL_TABLET | Freq: Every day | ORAL | Status: DC
  Filled 2014-07-20 (×2): qty 1

## 2014-07-20 MED ORDER — RISPERIDONE 2 MG PO TABS
2.0000 mg | ORAL_TABLET | Freq: Every day | ORAL | Status: DC
  Administered 2014-07-20: 2 mg via ORAL
  Filled 2014-07-20 (×2): qty 1

## 2014-07-20 MED ORDER — GABAPENTIN 400 MG PO CAPS: 400.0000 mg | ORAL_CAPSULE | Freq: Three times a day (TID) | ORAL | Status: DC

## 2014-07-20 MED ORDER — BUPROPION HCL ER (XL) 150 MG PO TB24
150.0000 mg | ORAL_TABLET | Freq: Every day | ORAL | Status: DC
  Administered 2014-07-20: 150 mg via ORAL
  Filled 2014-07-20 (×2): qty 1

## 2014-07-20 MED ORDER — POTASSIUM CHLORIDE CRYS ER 20 MEQ PO TBCR
20.0000 meq | EXTENDED_RELEASE_TABLET | Freq: Once | ORAL | Status: AC
  Administered 2014-07-20: 20 meq via ORAL
  Filled 2014-07-20: qty 1

## 2014-07-20 MED ORDER — ALUM & MAG HYDROXIDE-SIMETH 200-200-20 MG/5ML PO SUSP: 30.0000 mL | Freq: Four times a day (QID) | ORAL | Status: DC | PRN

## 2014-07-20 MED ORDER — ASPIRIN 81 MG PO CHEW
162.0000 mg | CHEWABLE_TABLET | Freq: Every evening | ORAL | Status: DC
  Administered 2014-07-20: 162 mg via ORAL
  Filled 2014-07-20 (×2): qty 2

## 2014-07-20 MED ORDER — PANTOPRAZOLE SODIUM 40 MG PO TBEC
40.0000 mg | DELAYED_RELEASE_TABLET | Freq: Every day | ORAL | Status: DC
  Administered 2014-07-20: 40 mg via ORAL
  Filled 2014-07-20: qty 1

## 2014-07-20 MED ORDER — GABAPENTIN 400 MG PO CAPS
400.0000 mg | ORAL_CAPSULE | Freq: Two times a day (BID) | ORAL | Status: DC
  Administered 2014-07-20: 400 mg via ORAL
  Filled 2014-07-20 (×2): qty 1

## 2014-07-20 MED ORDER — NITROGLYCERIN 0.4 MG SL SUBL: 0.4000 mg | SUBLINGUAL_TABLET | SUBLINGUAL | Status: DC | PRN

## 2014-07-20 MED ORDER — POTASSIUM CHLORIDE CRYS ER 20 MEQ PO TBCR
40.0000 meq | EXTENDED_RELEASE_TABLET | Freq: Once | ORAL | Status: AC
Start: 2014-07-20 — End: 2014-07-20
  Administered 2014-07-20: 40 meq via ORAL
  Filled 2014-07-20: qty 2

## 2014-07-20 MED ORDER — GABAPENTIN 400 MG PO CAPS
1200.0000 mg | ORAL_CAPSULE | Freq: Every day | ORAL | Status: DC
  Administered 2014-07-20: 1200 mg via ORAL
  Filled 2014-07-20 (×2): qty 3

## 2014-07-20 MED ORDER — TRAZODONE HCL 50 MG PO TABS: 200.0000 mg | ORAL_TABLET | Freq: Every evening | ORAL | Status: DC | PRN

## 2014-07-20 MED ORDER — SIMVASTATIN 40 MG PO TABS
40.0000 mg | ORAL_TABLET | Freq: Every evening | ORAL | Status: DC
  Filled 2014-07-20: qty 1

## 2014-07-20 MED ORDER — SODIUM CHLORIDE 0.9 % IV SOLN
INTRAVENOUS | Status: DC
  Administered 2014-07-20: 01:00:00 via INTRAVENOUS

## 2014-07-20 MED ORDER — ADULT MULTIVITAMIN W/MINERALS CH
1.0000 | ORAL_TABLET | Freq: Every day | ORAL | Status: DC
  Administered 2014-07-20: 1 via ORAL
  Filled 2014-07-20: qty 1

## 2014-07-20 MED ORDER — CARVEDILOL 3.125 MG PO TABS
3.1250 mg | ORAL_TABLET | Freq: Two times a day (BID) | ORAL | Status: DC
  Administered 2014-07-20 (×2): 3.125 mg via ORAL
  Filled 2014-07-20 (×3): qty 1

## 2014-07-20 MED ORDER — RISPERIDONE 2 MG PO TABS
4.0000 mg | ORAL_TABLET | Freq: Every day | ORAL | Status: DC
  Administered 2014-07-20: 4 mg via ORAL
  Filled 2014-07-20: qty 2

## 2014-07-20 MED ORDER — RISPERIDONE 2 MG PO TABS: 2.0000 mg | ORAL_TABLET | Freq: Two times a day (BID) | ORAL | Status: DC

## 2014-07-20 NOTE — Discharge Summary (Addendum)
Physician Discharge Summary  Rhonda Higgins OZD:[id number] DOB: 01/30/1971 DOA: 07/19/2014  PCP: Andria Frames, MD  Admit date: 07/19/2014 Discharge date: 07/20/2014  Time spent: 50* minutes  Recommendations for Outpatient Follow-up:  1. *Follow up PCP in 2 weeks 2. Follow up Dr Terrence Dupont in 4 weeks 3. Call Dr Terrence Dupont office to get the echo results  Discharge Diagnoses:  Principal Problem:   Chest pain Active Problems:   HLD (hyperlipidemia)   Obstructive sleep apnea   Allergic rhinitis   History of uterine cancer   Diabetes mellitus without complication   GERD (gastroesophageal reflux disease)   Personality disorder   Discharge Condition: Stable  Diet recommendation: Low salt diet  Filed Weights   07/20/14 0100 07/20/14 0500  Weight: 112.038 kg (247 lb) 111.8 kg (246 lb 7.6 oz)    History of present illness:   44 y.o. female with past medical history of diabetes mellitus, hyperlipidemia, GERD, history of uterine cancer (S/P of radiation and hysterectomy 2011. PAP smear was NEGATIVE FOR INTRAEPITHELIAL LESION OR MALIGNANCY on 05/19/13), obstructive sleep apnea not on CPAP, who presents with chest pain.  Patient reports that she started having chest pain at about 4 PM. It is located in the center of chest, 3 out of 10 in severity, dull, radiating to the back. The chest is not pruritic. It is not alleviated or aggravated by any known factors. The chest pain is intermittent. It happens every 30 minutes, lasts for about 1 minute each time. Patient does not have fever, chills, cough, shortness of breath. No nausea, vomiting, diarrhea, abdominal pain. No symptoms of UTI. No skin rashes, hematochezia, leg edema. When I saw patient the emergency room, she was chest pain-free. Patient reports that she had cold-like viral infection symptoms in early January, which had completely resolved.  Hospital Course:   Chest pain- resolved, ? Atypical, cardiology was consulted and they recommend  stress test as outpatient. Will follow up with Dr Terrence Dupont. 2 D echo has been done but the result is pending. Discussed with patient, she will call Dr Terrence Dupont office to get the results. Hypokalemia- Will replace potassium before discharge. GERD- continue ppi patient takes prilosec at home. Hypertension- BP was elevated at the time of admission, started on Coreg 3.125 mg po bid, will discharge her on Coreg.  Procedures:  *Echo, result is pending  Consultations:  Cardiology   Discharge Exam: Filed Vitals:   07/20/14 1356  BP: 123/77  Pulse: 79  Temp: 98 F (36.7 C)  Resp: 18    General: Appear in no acute distress Cardiovascular: S1s2 RRR Respiratory: *Clear bilaterally  Discharge Instructions   Discharge Instructions    Diet - low sodium heart healthy    Complete by:  As directed      Increase activity slowly    Complete by:  As directed           Current Discharge Medication List    START taking these medications   Details  carvedilol (COREG) 3.125 MG tablet Take 1 tablet (3.125 mg total) by mouth 2 (two) times daily with a meal. Qty: 60 tablet, Refills: 1      CONTINUE these medications which have NOT CHANGED   Details  aspirin 81 MG tablet Take 162 mg by mouth every evening.     buPROPion (WELLBUTRIN XL) 150 MG 24 hr tablet Take 150 mg by mouth at bedtime.     cetirizine (ZYRTEC) 10 MG tablet Take 10 mg by mouth at bedtime.  escitalopram (LEXAPRO) 5 MG tablet Take 5 mg by mouth at bedtime.     gabapentin (NEURONTIN) 400 MG capsule Take 400-1,200 mg by mouth 3 (three) times daily. Take 1 Tablet by mouth in the am, Take 1 Tablet by mouth in the afternoon and Take 3 Tablets by mouth at bedtime.    glimepiride (AMARYL) 2 MG tablet Take 2 mg by mouth daily with breakfast.     ibuprofen (ADVIL,MOTRIN) 200 MG tablet Take 400 mg by mouth every 6 (six) hours as needed for moderate pain (pain).     metFORMIN (GLUCOPHAGE) 850 MG tablet Take 850 mg by mouth 2  (two) times daily with a meal.    Multiple Vitamin (MULTIVITAMIN) tablet Take 1 tablet by mouth daily.    omeprazole (PRILOSEC) 40 MG capsule Take 40 mg by mouth at bedtime.     risperiDONE (RISPERDAL) 2 MG tablet Take 2-4 mg by mouth 2 (two) times daily. Take 2 Tablets by mouth in the morning and Take 1 Tablet by mouth at night.    simvastatin (ZOCOR) 40 MG tablet Take 40 mg by mouth every evening.    traZODone (DESYREL) 100 MG tablet Take 200 mg by mouth at bedtime as needed for sleep.        Allergies  Allergen Reactions  . Hydrocodone-Acetaminophen Shortness Of Breath  . Codeine Itching      The results of significant diagnostics from this hospitalization (including imaging, microbiology, ancillary and laboratory) are listed below for reference.    Significant Diagnostic Studies: Dg Chest Port 1 View  07/19/2014   CLINICAL DATA:  Acute onset of central chest pain. Initial encounter.  EXAM: PORTABLE CHEST - 1 VIEW  COMPARISON:  None.  FINDINGS: The lungs are well-aerated. Mild vascular congestion is noted. There is no evidence of focal opacification, pleural effusion or pneumothorax. Bibasilar densities are thought to reflect overlying soft tissues.  The cardiomediastinal silhouette is within normal limits. No acute osseous abnormalities are seen.  IMPRESSION: Mild vascular congestion noted; lungs remain grossly clear.   Electronically Signed   By: Garald Balding M.D.   On: 07/19/2014 21:32    Microbiology: No results found for this or any previous visit (from the past 240 hour(s)).   Labs: Basic Metabolic Panel:  Recent Labs Lab 07/19/14 2102 07/20/14 0147  NA 139 139  K 3.7 3.3*  CL 106 107  CO2 25 27  GLUCOSE 197* 91  BUN 11 10  CREATININE 0.62 0.61  CALCIUM 8.5 8.2*   Liver Function Tests:  Recent Labs Lab 07/20/14 0147  AST 57*  ALT 80*  ALKPHOS 100  BILITOT 0.5  PROT 6.5  ALBUMIN 3.8    Recent Labs Lab 07/19/14 2101  LIPASE 31   No results  for input(s): AMMONIA in the last 168 hours. CBC:  Recent Labs Lab 07/19/14 2102 07/20/14 0147  WBC 5.4 6.2  HGB 14.3 14.3  HCT 44.0 42.7  MCV 85.4 85.2  PLT 134* 124*   Cardiac Enzymes:  Recent Labs Lab 07/20/14 0415 07/20/14 0958 07/20/14 1552  TROPONINI <0.03 <0.03 <0.03   BNP: BNP (last 3 results)  Recent Labs  07/20/14 0148  BNP 15.1    ProBNP (last 3 results) No results for input(s): PROBNP in the last 8760 hours.  CBG:  Recent Labs Lab 07/20/14 0735 07/20/14 1152  GLUCAP 116* 152*       Signed:  LAMA,GAGAN S  [facility] 07/20/2014, 4:33 PM

## 2014-07-20 NOTE — H&P (Addendum)
[facility] History and Physical  Otha Rickles Higgins XTK:[id number] DOB: June 09, 1971 DOA: 07/19/2014  Referring physician: ED physician PCP: Andria Frames, MD  Specialists:   Chief Complaint: chest pain  HPI: Rhonda Higgins is a 44 y.o. female with past medical history of diabetes mellitus, hyperlipidemia, GERD, history of uterine cancer (S/P of radiation and hysterectomy 2011. PAP smear was NEGATIVE FOR INTRAEPITHELIAL LESION OR MALIGNANCY on 05/19/13), obstructive sleep apnea not on CPAP, who presents with chest pain.  Patient reports that she started having chest pain at about 4 PM. It is located in the center of chest, 3 out of 10 in severity, dull, radiating to the back. The chest is not pruritic. It is not alleviated or aggravated by any known factors. The chest pain is intermittent. It happens every 30 minutes, lasts for about 1 minute each time. Patient does not have fever, chills, cough, shortness of breath. No nausea, vomiting, diarrhea, abdominal pain. No symptoms of UTI. No skin rashes, hematochezia, leg edema. When I saw patient the emergency room, she was chest pain-free. Patient reports that she had cold-like viral infection symptoms in early January, which had completely resolved.  Work up in the ED demonstrates a negative d-dimer, negative troponin, negative lipase, negative chest x-ray for infiltration, but CXR showed mild congestion, no leukocytosis, normal body temperature. EKG showed nonspecific T wave flattening and relatively low voltage. Patient is admitted to inpatient for further evaluation and treatment.  Review of Systems: As presented in the history of presenting illness, rest negative.  Where does patient live?  At home Can patient participate in ADLs? Yes  Allergy:  Allergies  Allergen Reactions  . Hydrocodone-Acetaminophen Shortness Of Breath  . Codeine Itching    Past Medical History  Diagnosis Date  . Allergic rhinitis   . Hyperlipidemia   .  Diabetes mellitus   . Personality disorder   . Overactive bladder   . UTI (urinary tract infection)   . Depression   . GERD (gastroesophageal reflux disease)   . Non-insulin dependent type 2 diabetes mellitus   . Anemia   . Elevated cholesterol   . Right lower quadrant pain   . Polyphagia   . Uterine cancer   . Adenocarcinoma     endometrial, FIGO GRADE 1  . Kidney stone 6/13    H/O  . 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 Inner ear surgery      L ear d/t tumor and R ear d/t hole in ear drum  . Tonsillectomy      age 69  . Total abdominal hysterectomy  feb 2011    BSO, node dissection  . Endometrial biopsy      Social History:  reports that she has never smoked. She has never used smokeless tobacco. She reports that she drinks alcohol. She reports that she does not use illicit drugs.  Family History:  Family History  Problem Relation Age of Onset  . Asthma Mother   . Heart disease Mother   . Diabetes Mother   . Emphysema Mother   . Hypertension Mother   . Stroke Mother   . Prostate cancer Father   . Heart disease Sister   . Heart attack Brother   . Heart disease Brother   . Heart disease Sister   . Diabetes Sister      Prior to Admission medications   Medication Sig Start Date End Date Taking? Authorizing Provider  aspirin 81 MG tablet Take 162 mg by mouth every evening.    Yes Historical Provider, MD  buPROPion (WELLBUTRIN XL) 150 MG 24 hr tablet Take 150 mg by mouth at bedtime.    Yes Historical Provider, MD  cetirizine (ZYRTEC) 10 MG tablet Take 10 mg by mouth at bedtime.    Yes Historical Provider, MD  escitalopram (LEXAPRO) 5 MG tablet Take 5 mg by mouth at bedtime.    Yes Historical Provider, MD  gabapentin (NEURONTIN) 400 MG capsule Take 400-1,200 mg by mouth 3 (three) times daily. Take 1 Tablet by mouth in the am, Take 1 Tablet by mouth in the afternoon and Take 3 Tablets by mouth at  bedtime.   Yes Historical Provider, MD  glimepiride (AMARYL) 2 MG tablet Take 2 mg by mouth daily with breakfast.    Yes Historical Provider, MD  ibuprofen (ADVIL,MOTRIN) 200 MG tablet Take 400 mg by mouth every 6 (six) hours as needed for moderate pain (pain).    Yes Historical Provider, MD  metFORMIN (GLUCOPHAGE) 850 MG tablet Take 850 mg by mouth 2 (two) times daily with a meal.   Yes Historical Provider, MD  Multiple Vitamin (MULTIVITAMIN) tablet Take 1 tablet by mouth daily.   Yes Historical Provider, MD  omeprazole (PRILOSEC) 40 MG capsule Take 40 mg by mouth at bedtime.    Yes Historical Provider, MD  risperiDONE (RISPERDAL) 2 MG tablet Take 2-4 mg by mouth 2 (two) times daily. Take 2 Tablets by mouth in the morning and Take 1 Tablet by mouth at night.   Yes Historical Provider, MD  simvastatin (ZOCOR) 40 MG tablet Take 40 mg by mouth every evening.   Yes Historical Provider, MD  traZODone (DESYREL) 100 MG tablet Take 200 mg by mouth at bedtime as needed for sleep.     Historical Provider, MD    Physical Exam: Filed Vitals:   07/19/14 2053  BP: 166/88  Pulse: 98  Temp: 97.9 F (36.6 C)  TempSrc: Oral  Resp: 16  SpO2: 97%   General: Not in acute distress HEENT:       Eyes: PERRL, EOMI, no scleral icterus       ENT: No discharge from the ears and nose, no pharynx injection, no tonsillar enlargement.        Neck: No JVD, no bruit, no mass felt. Cardiac: S1/S2, RRR, No murmurs, No gallops or rubs Pulm: Good air movement bilaterally. Clear to auscultation bilaterally. No rales, wheezing, rhonchi or rubs. Abd: Soft, nondistended, nontender, no rebound pain, no organomegaly, BS present Ext: No edema bilaterally. 2+DP/PT pulse bilaterally Musculoskeletal: No joint deformities, erythema, or stiffness, ROM full Skin: No rashes.  Neuro: Alert and oriented X3, cranial nerves II-XII grossly intact, muscle strength 5/5 in all extremeties, sensation to light touch intact.  Psych: Patient is  not psychotic, no suicidal or hemocidal ideation.  Labs on Admission:  Basic Metabolic Panel:  Recent Labs Lab 07/19/14 2102  NA 139  K 3.7  CL 106  CO2 25  GLUCOSE 197*  BUN 11  CREATININE 0.62  CALCIUM 8.5   Liver Function Tests: No results for input(s): AST, ALT, ALKPHOS, BILITOT, PROT, ALBUMIN in the last 168 hours.  Recent Labs Lab 07/19/14 2101  LIPASE 31   No results for input(s): AMMONIA in the last 168 hours. CBC:  Recent Labs Lab 07/19/14 2102  WBC 5.4  HGB 14.3  HCT 44.0  MCV 85.4  PLT 134*   Cardiac Enzymes: No results for  input(s): CKTOTAL, CKMB, CKMBINDEX, TROPONINI in the last 168 hours.  BNP (last 3 results) No results for input(s): PROBNP in the last 8760 hours. CBG: No results for input(s): GLUCAP in the last 168 hours.  Radiological Exams on Admission: Dg Chest Port 1 View  07/19/2014   CLINICAL DATA:  Acute onset of central chest pain. Initial encounter.  EXAM: PORTABLE CHEST - 1 VIEW  COMPARISON:  None.  FINDINGS: The lungs are well-aerated. Mild vascular congestion is noted. There is no evidence of focal opacification, pleural effusion or pneumothorax. Bibasilar densities are thought to reflect overlying soft tissues.  The cardiomediastinal silhouette is within normal limits. No acute osseous abnormalities are seen.  IMPRESSION: Mild vascular congestion noted; lungs remain grossly clear.   Electronically Signed   By: Garald Balding M.D.   On: 07/19/2014 21:32    EKG: Independently reviewed. Nonspecific T wave flattening, relatively low voltage.  Assessment/Plan Principal Problem:   Chest pain Active Problems:   HLD (hyperlipidemia)   Obstructive sleep apnea   Allergic rhinitis   History of uterine cancer   Diabetes mellitus without complication   GERD (gastroesophageal reflux disease)   Personality disorder  Chest pain: She has atypical chest pain. Differential diagnosis includes ACS given her significant risk factors, including  diabetes, hyperlipidemia, elevated blood pressure, significant family history of coronary artery disease; pleurisy or pericarditis given recent viral infection; aortic dissection (less likely, though her chest pain radiates to the back, she had negative d-dimer, no mediastinal widening on chest x-ray, her chest pain is not typically tearing for this diagnosis) and gerd.  - will admit to Tele bed  - cycle CE q6 x3 and repeat her EKG in the am  - Nitroglycerin, and aspirin, zocor - start coreg 3.125 mg bid  - Patient refused morphine since she is allergic to Percocet  - Check FLP and A1C  - Consider cardiology consult if test positive for CEs  - 2d echo - check BNP given mild pulmonary congestion on chest x-ray to rule out congestive heart failure.  Diabetes mellitus: No A1c recorded. The patient is taking Amaryl and metformin at home. -Sliding-scale insulin -check A1c  GERD: -Continue PPI  -And Maalox  Personality disorder: No suicidal or homicidal ideations.  -continue home medications: Lexapro, risperidone, Wellbutrin  Hyperlipidemia: -Continue Zocor -Check FLP  Elevated blood pressure: Patient does not have history of hypertension. Her blood pressure is elevated at 166/88 on admission. Likely due to chest pain. Also possible for undiagnosed hypertension  - started Coreg   DVT ppx: SQ Heparin     Code Status: Full code Family Communication:     Yes, patient's   "wife"    at bed side Disposition Plan: Admit to inpatient   Date of Service 07/20/2014    Ivor Costa [facility] Pager [phone]  If 7PM-7AM, please contact night-coverage [email or web address] Password [email or web address] 07/20/2014, 12:06 AM

## 2014-07-20 NOTE — Progress Notes (Signed)
*  PRELIMINARY RESULTS* Echocardiogram 2D Echocardiogram has been performed.  Leavy Cella 07/20/2014, 1:01 PM

## 2014-07-20 NOTE — Consult Note (Signed)
Reason for Consult: Chest pain  Requesting Physician: [place]  Cardiologist: Terrence Dupont  HPI: This is a 44 y.o. female with a past medical history significant for DM type II. Obesity, hyperlipidemia, OSA and GERD admitted with intermittent, relatively brief episodes of chest pressure at rest. Symptoms have resolved. She reports a stress treadmill test 10 years ago (after her brother died of MI) and a pharmacological nuclear scan as an outpatient with Dr. Terrence Dupont, "normal", a few years ago.  PMHx:  Past Medical History  Diagnosis Date  . Allergic rhinitis   . Hyperlipidemia   . Diabetes mellitus   . Personality disorder   . Overactive bladder   . UTI (urinary tract infection)   . Depression   . GERD (gastroesophageal reflux disease)   . Non-insulin dependent type 2 diabetes mellitus   . Anemia   . Elevated cholesterol   . Right lower quadrant pain   . Polyphagia   . Uterine cancer   . Adenocarcinoma     endometrial, FIGO GRADE 1  . Kidney stone 6/13    H/O  . 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 Inner ear surgery      L ear d/t tumor and R ear d/t hole in ear drum  . Tonsillectomy      age 86  . Total abdominal hysterectomy  feb 2011    BSO, node dissection  . Endometrial biopsy      FAMHx: Family History  Problem Relation Age of Onset  . Asthma Mother   . Heart disease Mother   . Diabetes Mother   . Emphysema Mother   . Hypertension Mother   . Stroke Mother   . Prostate cancer Father   . Heart disease Sister   . Heart attack Brother   . Heart disease Brother   . Heart disease Sister   . Diabetes Sister     SOCHx:  reports that she has never smoked. She has never used smokeless tobacco. She reports that she drinks alcohol. She reports that she does not use illicit drugs.  ALLERGIES: Allergies  Allergen Reactions  . Hydrocodone-Acetaminophen Shortness Of Breath  . Codeine  Itching    ROS: The patient specifically denies dyspnea at rest or with exertion, orthopnea, paroxysmal nocturnal dyspnea, syncope, palpitations, focal neurological deficits, intermittent claudication, lower extremity edema, unexplained weight gain, cough, hemoptysis or wheezing.  The patient also denies abdominal pain, nausea, vomiting, dysphagia, diarrhea, constipation, polyuria, polydipsia, dysuria, hematuria, frequency, urgency, abnormal bleeding or bruising, fever, chills, unexpected weight changes, mood swings, change in skin or hair texture, change in voice quality, auditory or visual problems, allergic reactions or rashes, new musculoskeletal complaints other than usual "aches and pains".   HOME MEDICATIONS: Prescriptions prior to admission  Medication Sig Dispense Refill Last Dose  . aspirin 81 MG tablet Take 162 mg by mouth every evening.    07/18/2014 at Unknown time  . buPROPion (WELLBUTRIN XL) 150 MG 24 hr tablet Take 150 mg by mouth at bedtime.    07/18/2014 at Unknown time  . cetirizine (ZYRTEC) 10 MG tablet Take 10 mg by mouth at bedtime.    07/18/2014 at Unknown time  . escitalopram (LEXAPRO) 5 MG tablet Take 5 mg by mouth at bedtime.    07/18/2014 at Unknown time  . gabapentin (NEURONTIN) 400 MG capsule Take 400-1,200 mg by mouth 3 (three) times daily. Take 1 Tablet by mouth in  the am, Take 1 Tablet by mouth in the afternoon and Take 3 Tablets by mouth at bedtime.   07/19/2014 at Unknown time  . glimepiride (AMARYL) 2 MG tablet Take 2 mg by mouth daily with breakfast.    07/19/2014 at Unknown time  . ibuprofen (ADVIL,MOTRIN) 200 MG tablet Take 400 mg by mouth every 6 (six) hours as needed for moderate pain (pain).    Past Month at Unknown time  . metFORMIN (GLUCOPHAGE) 850 MG tablet Take 850 mg by mouth 2 (two) times daily with a meal.   07/19/2014 at Unknown time  . Multiple Vitamin (MULTIVITAMIN) tablet Take 1 tablet by mouth daily.   07/19/2014 at Unknown time  . omeprazole  (PRILOSEC) 40 MG capsule Take 40 mg by mouth at bedtime.    07/18/2014 at Unknown time  . risperiDONE (RISPERDAL) 2 MG tablet Take 2-4 mg by mouth 2 (two) times daily. Take 2 Tablets by mouth in the morning and Take 1 Tablet by mouth at night.   07/19/2014 at Unknown time  . simvastatin (ZOCOR) 40 MG tablet Take 40 mg by mouth every evening.   07/18/2014 at Unknown time  . traZODone (DESYREL) 100 MG tablet Take 200 mg by mouth at bedtime as needed for sleep.    unknown at unknown time    HOSPITAL MEDICATIONS: Prior to Admission:  Prescriptions prior to admission  Medication Sig Dispense Refill Last Dose  . aspirin 81 MG tablet Take 162 mg by mouth every evening.    07/18/2014 at Unknown time  . buPROPion (WELLBUTRIN XL) 150 MG 24 hr tablet Take 150 mg by mouth at bedtime.    07/18/2014 at Unknown time  . cetirizine (ZYRTEC) 10 MG tablet Take 10 mg by mouth at bedtime.    07/18/2014 at Unknown time  . escitalopram (LEXAPRO) 5 MG tablet Take 5 mg by mouth at bedtime.    07/18/2014 at Unknown time  . gabapentin (NEURONTIN) 400 MG capsule Take 400-1,200 mg by mouth 3 (three) times daily. Take 1 Tablet by mouth in the am, Take 1 Tablet by mouth in the afternoon and Take 3 Tablets by mouth at bedtime.   07/19/2014 at Unknown time  . glimepiride (AMARYL) 2 MG tablet Take 2 mg by mouth daily with breakfast.    07/19/2014 at Unknown time  . ibuprofen (ADVIL,MOTRIN) 200 MG tablet Take 400 mg by mouth every 6 (six) hours as needed for moderate pain (pain).    Past Month at Unknown time  . metFORMIN (GLUCOPHAGE) 850 MG tablet Take 850 mg by mouth 2 (two) times daily with a meal.   07/19/2014 at Unknown time  . Multiple Vitamin (MULTIVITAMIN) tablet Take 1 tablet by mouth daily.   07/19/2014 at Unknown time  . omeprazole (PRILOSEC) 40 MG capsule Take 40 mg by mouth at bedtime.    07/18/2014 at Unknown time  . risperiDONE (RISPERDAL) 2 MG tablet Take 2-4 mg by mouth 2 (two) times daily. Take 2 Tablets by mouth in the  morning and Take 1 Tablet by mouth at night.   07/19/2014 at Unknown time  . simvastatin (ZOCOR) 40 MG tablet Take 40 mg by mouth every evening.   07/18/2014 at Unknown time  . traZODone (DESYREL) 100 MG tablet Take 200 mg by mouth at bedtime as needed for sleep.    unknown at unknown time   Scheduled: . aspirin  162 mg Oral QPM  . buPROPion  150 mg Oral QHS  . carvedilol  3.125 mg  Oral BID WC  . escitalopram  5 mg Oral QHS  . gabapentin  1,200 mg Oral QHS  . gabapentin  400 mg Oral BID  . heparin  5,000 Units Subcutaneous 3 times per day  . insulin aspart  0-9 Units Subcutaneous TID WC  . multivitamin with minerals  1 tablet Oral Daily  . pantoprazole  40 mg Oral Daily  . risperiDONE  2 mg Oral QHS  . risperiDONE  4 mg Oral Daily  . simvastatin  40 mg Oral QPM  . sodium chloride  3 mL Intravenous Q12H    VITALS: Blood pressure 124/72, pulse 79, temperature 98.1 F (36.7 C), temperature source Oral, resp. rate 18, height 5\' 3"  (1.6 m), weight 246 lb 7.6 oz (111.8 kg), SpO2 95 %.  PHYSICAL EXAM:  General: Alert, oriented x3, no distress Head: no evidence of trauma, PERRL, EOMI, no exophtalmos or lid lag, no myxedema, no xanthelasma; normal ears, nose and oropharynx Neck: normal jugular venous pulsations and no hepatojugular reflux; brisk carotid pulses without delay and no carotid bruits Chest: clear to auscultation, no signs of consolidation by percussion or palpation, normal fremitus, symmetrical and full respiratory excursions Cardiovascular: normal position and quality of the apical impulse, regular rhythm, normal first heart sound and normal second heart sound, no rubs or gallops, no murmur Abdomen: no tenderness or distention, no masses by palpation, no abnormal pulsatility or arterial bruits, normal bowel sounds, no hepatosplenomegaly Extremities: no clubbing, cyanosis;  no edema; 2+ radial, ulnar and brachial pulses bilaterally; 2+ right femoral, posterior tibial and dorsalis  pedis pulses; 2+ left femoral, posterior tibial and dorsalis pedis pulses; no subclavian or femoral bruits Neurological: grossly nonfocal   LABS  CBC  Recent Labs  07/19/14 2102 07/20/14 0147  WBC 5.4 6.2  HGB 14.3 14.3  HCT 44.0 42.7  MCV 85.4 85.2  PLT 134* 854*   Basic Metabolic Panel  Recent Labs  07/19/14 2102 07/20/14 0147  NA 139 139  K 3.7 3.3*  CL 106 107  CO2 25 27  GLUCOSE 197* 91  BUN 11 10  CREATININE 0.62 0.61  CALCIUM 8.5 8.2*   Liver Function Tests  Recent Labs  07/20/14 0147  AST 57*  ALT 80*  ALKPHOS 100  BILITOT 0.5  PROT 6.5  ALBUMIN 3.8    Recent Labs  07/19/14 2101  LIPASE 31   Cardiac Enzymes  Recent Labs  07/20/14 0415 07/20/14 0958  TROPONINI <0.03 <0.03   BNP Invalid input(s): POCBNP D-Dimer  Recent Labs  07/19/14 2101  DDIMER <0.27   Hemoglobin A1C No results for input(s): HGBA1C in the last 72 hours. Fasting Lipid Panel  Recent Labs  07/20/14 0147  CHOL 127  HDL 33*  LDLCALC 75  TRIG 97  CHOLHDL 3.8   Thyroid Function Tests No results for input(s): TSH, T4TOTAL, T3FREE, THYROIDAB in the last 72 hours.  Invalid input(s): FREET3    IMAGING: Dg Chest Port 1 View  07/19/2014   CLINICAL DATA:  Acute onset of central chest pain. Initial encounter.  EXAM: PORTABLE CHEST - 1 VIEW  COMPARISON:  None.  FINDINGS: The lungs are well-aerated. Mild vascular congestion is noted. There is no evidence of focal opacification, pleural effusion or pneumothorax. Bibasilar densities are thought to reflect overlying soft tissues.  The cardiomediastinal silhouette is within normal limits. No acute osseous abnormalities are seen.  IMPRESSION: Mild vascular congestion noted; lungs remain grossly clear.   Electronically Signed   By: Francoise Schaumann.D.  On: 07/19/2014 21:32    ECG: NSR, normal except obesity related low voltage  TELEMETRY: NSR  IMPRESSION: 1. Probably non cardiac chest pain. Symptoms have resolved,  ECG and enzymes low risk. Low K may need repletion  RECOMMENDATION: 1. Follow up with Dr Terrence Dupont, with whom she has had previous evaluation and cardiac testing Depending on how long ago stress testing last performed, he may decide on further evaluation, but this can be done as an outpatient.  Time Spent Directly with Patient: 45 minutes  Sanda Klein, MD, [facility] [phone] office [phone] pager   07/20/2014, 11:16 AM

## 2014-07-20 NOTE — Progress Notes (Signed)
Patient discharged home with friend, discharge instructions given and explained to patient and she verbalized understanding, patient denies any pain/distress. No wound noted, skin intact. Patient accompanied home by friend, transported to the car by staff.

## 2014-07-21 LAB — HEMOGLOBIN A1C
Hgb A1c MFr Bld: 7.2 % — ABNORMAL HIGH (ref 4.8–5.6)
Mean Plasma Glucose: 160 mg/dL

## 2014-07-24 ENCOUNTER — Encounter: Payer: Self-pay | Admitting: *Deleted

## 2014-07-24 ENCOUNTER — Encounter: Payer: Medicaid Other | Attending: Family Medicine | Admitting: *Deleted

## 2014-07-24 VITALS — Ht 63.0 in | Wt 248.3 lb

## 2014-07-24 DIAGNOSIS — E119 Type 2 diabetes mellitus without complications: Secondary | ICD-10-CM | POA: Diagnosis not present

## 2014-07-24 DIAGNOSIS — Z713 Dietary counseling and surveillance: Secondary | ICD-10-CM | POA: Diagnosis not present

## 2014-07-24 NOTE — Patient Instructions (Signed)
Plan:  Aim for 2 Carb Choices per meal (30 grams) +/- 1 either way  Aim for 0-1 Carbs per snack if hungry  Include protein in moderation with your meals and snacks Consider reading food labels for Total Carbohydrate of foods Consider checking BG at alternate times per day if OK with MD  Continue taking medication as directed by MD

## 2014-07-24 NOTE — Progress Notes (Signed)
Diabetes Self-Management Education  Visit Type:  Initial  Appt. Start Time: 0830 Appt. End Time: 1000  07/24/2014  Rhonda Higgins, identified by name and date of birth, is a 44 y.o. female with a diagnosis of Diabetes: Type 2.  Other people present during visit:  Patient, Spouse/SO   ASSESSMENT  Height 5\' 3"  (1.6 m), weight 248 lb 4.8 oz (112.628 kg). Body mass index is 44 kg/(m^2).  Initial Visit Information:  Are you currently following a meal plan?: No   Are you taking your medications as prescribed?: Yes Are you checking your feet?: Yes How many days per week are you checking your feet?: 1 How often do you need to have someone help you when you read instructions, pamphlets, or other written materials from your doctor or pharmacy?: 1 - Never    Psychosocial:     Patient Belief/Attitude about Diabetes: Defeat/Burnout (Frustrated when BG is high and she doesn't know why) Self-care barriers: None Self-management support: Family Other persons present: Patient, Spouse/SO Special Needs: None Preferred Learning Style: Hands on Learning Readiness: Ready  Complications:   Last HgB A1C per patient/outside source: 7.3 % How often do you check your blood sugar?: 1-2 times/day (in AM usually) Fasting Blood glucose range (mg/dL): 130-179 Number of hypoglycemic episodes per month: 1 Have you had a dental exam in the past 12 months?: Yes  Diet Intake:  Breakfast: skips about 3 days a week, regualr oatmeal with cinnamon and raisins OR  boiled egg, coffee with sweetener and creamer Snack (morning): occasioanally chips Lunch: sandwich, occasionally chips, water Dinner: meat, starch, vegetable, water or SF flavored water Snack (evening): occasionally fruit Beverage(s): coffee, water or flavored water  Exercise:  Exercise: ADL's (cares with her partner's parents' appointments)  Individualized Plan for Diabetes Self-Management Training:   Learning Objective:  Patient  will have a greater understanding of diabetes self-management.  Patient education plan per assessed needs and concerns is to attend individual sessions for better BG control and weight loss   Education Topics Reviewed with Patient Today:    Role of diet in the treatment of diabetes and the relationship between the three main macronutrients and blood glucose level, Food label reading, portion sizes and measuring food., Carbohydrate counting  (Plan to discuss at next visit) Reviewed patients medication for diabetes, action, purpose, timing of dose and side effects. Identified appropriate SMBG and/or A1C goals., Purpose and frequency of SMBG. Taught treatment of hypoglycemia - the 15 rule.   Role of stress on diabetes      PATIENTS GOALS/Plan (Developed by the patient):  Nutrition: Follow meal plan discussed Medications: take my medication as prescribed Monitoring : test my blood glucose as discussed (note x per day with comment) (consider after meals on occasion) Reducing Risk: treat hypoglycemia with 15 grams of carbs if blood glucose less than 70mg /dL  Plan:   Patient Instructions  Plan:  Aim for 2 Carb Choices per meal (30 grams) +/- 1 either way  Aim for 0-1 Carbs per snack if hungry  Include protein in moderation with your meals and snacks Consider reading food labels for Total Carbohydrate of foods Consider checking BG at alternate times per day if OK with MD  Continue taking medication as directed by MD    Plan to discuss exercise in more detail at next visit.   Expected Outcomes:  Demonstrated interest in learning. Expect positive outcomes  Education material provided: Living Well with Diabetes, Meal plan card and Carbohydrate counting sheet, DM medication handout  If problems or questions, patient to contact team via:  Phone and Email  Future DSME appointment: 4-6 wks

## 2014-08-14 ENCOUNTER — Other Ambulatory Visit: Payer: Self-pay

## 2014-08-14 DIAGNOSIS — Z1231 Encounter for screening mammogram for malignant neoplasm of breast: Secondary | ICD-10-CM

## 2014-08-25 ENCOUNTER — Ambulatory Visit
Admission: RE | Admit: 2014-08-25 | Discharge: 2014-08-25 | Disposition: A | Discharge status: Home / Self Care | Payer: Medicaid Other | Source: Ambulatory Visit

## 2014-08-25 DIAGNOSIS — Z1231 Encounter for screening mammogram for malignant neoplasm of breast: Secondary | ICD-10-CM

## 2014-08-28 ENCOUNTER — Encounter: Payer: Medicaid Other | Attending: Family Medicine | Admitting: *Deleted

## 2014-08-28 VITALS — Ht 63.0 in | Wt 246.7 lb

## 2014-08-28 DIAGNOSIS — E119 Type 2 diabetes mellitus without complications: Secondary | ICD-10-CM | POA: Insufficient documentation

## 2014-08-28 DIAGNOSIS — Z713 Dietary counseling and surveillance: Secondary | ICD-10-CM | POA: Diagnosis not present

## 2014-08-28 NOTE — Patient Instructions (Addendum)
Plan:  Aim for 2 Carb Choices per meal (30 grams) +/- 1 either way  Aim for 0-1 Carbs per snack if hungry  Include protein in moderation with your meals and snacks Continue reading food labels for Total Carbohydrate of foods Continue checking BG at alternate times per day if OK with MD  Continue taking medication as directed by MD Doristine Devoid job!

## 2014-08-28 NOTE — Progress Notes (Signed)
   Diabetes Self-Management Education  Visit Type:   Follow up   Appt. Start Time: 0900 Appt. End Time: 0930  08/28/2014  Rhonda Higgins, identified by name and date of birth, is a 44 y.o. female with a diagnosis of Diabetes: Type 2.  Other people present during visit:  Patient, Spouse/SO   ASSESSMENT  Height 5\' 3"  (1.6 m), weight 246 lb 11.2 oz (111.902 kg). Body mass index is 43.71 kg/(m^2).    Subsequent Visit Information: Patient pleased with 2 pound weight loss in past month. Is comfortable with carb counting and following set meal plan. States she struggles with desire for something sweet in the evenings but makes choices based on carb content of her foods.    Psychosocial:     Self-care barriers: None Self-management support: Family Other persons present: Patient, Spouse/SO Patient Concerns: Nutrition/Meal planning, Medication Special Needs: None Learning Readiness: Change in progress  Complications:   Last HgB A1C per patient/outside source: 6.9 mg/dL How often do you check your blood sugar?: 1-2 times/day Fasting Blood glucose range (mg/dL): 70-129 Postprandial Blood glucose range (mg/dL): 130-179 Number of hypoglycemic episodes per month: 1 Can you tell when your blood sugar is low?: Yes  Diet Intake:     Exercise:  Exercise: ADL's   Individualized Plan for Diabetes Self-Management Training:   Learning Objective:  Patient will have a greater understanding of diabetes self-management.  Patient education plan per assessed needs and concerns is to attend individual sessions for    Education Topics Reviewed with Patient Today:    Carbohydrate counting Role of exercise on diabetes management, blood pressure control and cardiac health. Reviewed patients medication for diabetes, action, purpose, timing of dose and side effects. Identified appropriate SMBG and/or A1C goals. Taught treatment of hypoglycemia - the 15 rule.          PATIENTS  GOALS/Plan (Developed by the patient):  Nutrition: Follow meal plan discussed Physical Activity: Exercise 1-2 times per week (Look for ways to "play outside") Medications: take my medication as prescribed Monitoring : test blood glucose pre and post meals as discussed Reducing Risk: treat hypoglycemia with 15 grams of carbs if blood glucose less than 70mg /dL  Patient Self Evaluation of Goals - Patient rates self as meeting previously set goals:       Plan:   Patient Instructions  Plan:  Aim for 2 Carb Choices per meal (30 grams) +/- 1 either way  Aim for 0-1 Carbs per snack if hungry  Include protein in moderation with your meals and snacks Continue reading food labels for Total Carbohydrate of foods Continue checking BG at alternate times per day if OK with MD  Continue taking medication as directed by MD Doristine Devoid job!        Expected Outcomes:  Demonstrated interest in learning. Expect positive outcomes  Education material provided: DM Medication Sheet  If problems or questions, patient to contact team via:  Phone and Email  Future DSME appointment: - 4-6 wks

## 2014-10-09 ENCOUNTER — Encounter: Payer: Self-pay | Admitting: *Deleted

## 2014-10-09 ENCOUNTER — Encounter: Payer: Medicaid Other | Attending: Family Medicine | Admitting: *Deleted

## 2014-10-09 VITALS — Ht 63.0 in | Wt 249.1 lb

## 2014-10-09 DIAGNOSIS — E119 Type 2 diabetes mellitus without complications: Secondary | ICD-10-CM | POA: Diagnosis not present

## 2014-10-09 DIAGNOSIS — Z713 Dietary counseling and surveillance: Secondary | ICD-10-CM | POA: Diagnosis not present

## 2014-10-09 NOTE — Progress Notes (Signed)
Diabetes Self-Management Education  Visit Type:     Appt. Start Time: 0915 Appt. End Time: 0945  10/09/2014  [name], identified by name and date of birth, is a 44 y.o. female with a diagnosis of Diabetes: Type 2.  Other people present during visit:  Spouse/SO, Patient   ASSESSMENT  Height 5\' 3"  (1.6 m), weight 249 lb 1.6 oz (112.991 kg). Body mass index is 44.14 kg/(m^2).    Subsequent Visit Information:  Since your last visit, have you continued or began the use of a meal plan?: Yes How many days a week are you following a meal plan?: 5 Since your last visit, have you continued or began to exercise on a consistent basis?: No Since your last visit have you continued or begun to take your medications as prescribed?: Yes Since your last visit have you experienced any weight changes?: No change Since your last visit, are you checking your blood glucose at least once a day?: Yes  Psychosocial:     Patient Belief/Attitude about Diabetes: Defeat/Burnout (Just lost 2 of her cats and has been notified of rent increase so plans to move in the near future.) Self-care barriers: None Self-management support: Family Other persons present: Spouse/SO, Patient Patient Concerns: Nutrition/Meal planning, Medication Special Needs: None Learning Readiness: Change in progress  Complications:   Last HgB A1C per patient/outside source: 6.9 mg/dL How often do you check your blood sugar?: 1-2 times/day Have you had a dental exam in the past 12 months?: Yes  Diet Intake:     Exercise:      Individualized Plan for Diabetes Self-Management Training:   Learning Objective:  Patient will have a greater understanding of diabetes self-management.  Patient education plan per assessed needs and concerns is to attend individual sessions for    Education Topics Reviewed with Patient Today:      Role of exercise on diabetes management, blood pressure control and cardiac  health. Reviewed patients medication for diabetes, action, purpose, timing of dose and side effects. Identified appropriate SMBG and/or A1C goals.     Worked with patient to identify barriers to care and solutions, Identified and addressed patients feelings and concerns about diabetes, Role of stress on diabetes      PATIENTS GOALS/Plan (Developed by the patient):  Nutrition: Follow meal plan discussed Physical Activity: Exercise 3-5 times per week (Discussed Arm Chair Exercises) Medications: take my medication as prescribed Monitoring : test blood glucose pre and post meals as discussed  Patient Self Evaluation of Goals - Patient rates self as meeting previously set goals:   Nutrition: 50 - 75 % Physical Activity: < 25% Medications: >75% Monitoring: >75% Health Coping: 50 - 75 %   Plan:   Patient Instructions  Plan:  Aim for 3 Carb Choices per meal (45 grams) +/- 1 either way  Aim for 0-1 Carbs per snack if hungry  Include protein in moderation with your meals and snacks Continue reading food labels for Total Carbohydrate of foods Continue checking BG at alternate times per day if OK with MD  Consider Arm Chair Exercises or taking the stairs to increase your activity level daily as tolerated Continue taking medication as directed by MD Doristine Devoid job!       Expected Outcomes:  Demonstrated interest in learning. Expect positive outcomes  Education material provided: Support group flyer  If problems or questions, patient to contact team via:  Phone  Future DSME appointment: -  3 months

## 2014-10-09 NOTE — Patient Instructions (Signed)
Plan:  Aim for 3 Carb Choices per meal (45 grams) +/- 1 either way  Aim for 0-1 Carbs per snack if hungry  Include protein in moderation with your meals and snacks Continue reading food labels for Total Carbohydrate of foods Continue checking BG at alternate times per day if OK with MD  Consider Arm Chair Exercises or taking the stairs to increase your activity level daily as tolerated Continue taking medication as directed by MD Doristine Devoid job!

## 2014-11-05 ENCOUNTER — Ambulatory Visit (INDEPENDENT_AMBULATORY_CARE_PROVIDER_SITE_OTHER): Payer: Medicaid Other | Admitting: Pulmonary Disease

## 2014-11-05 ENCOUNTER — Encounter: Payer: Self-pay | Admitting: Pulmonary Disease

## 2014-11-05 VITALS — BP 122/70 | HR 81 | Temp 97.8°F | Ht 63.0 in | Wt 250.2 lb

## 2014-11-05 DIAGNOSIS — G4733 Obstructive sleep apnea (adult) (pediatric): Secondary | ICD-10-CM

## 2014-11-05 NOTE — Assessment & Plan Note (Signed)
The patient is trying to wear C Pap is much as possible, but continues to have issues with mask fit. When she does wear the device, she feels that is helping her. At this point, I would like to get her referred to the sleep Center for a formal mask fitting, and will also get a download off her device to make sure her pressure is adequate. I've also asked her to continue working aggressively on weight loss.

## 2014-11-05 NOTE — Patient Instructions (Signed)
Will arrange for mask fitting session at the sleep center. Will have your home care company to get a download off your machine to make sure pressure is adequate. Keep working on weight loss followup with Dr. Halford Chessman in one year.

## 2014-11-05 NOTE — Progress Notes (Signed)
   Subjective:    Patient ID: Rhonda Higgins, female    DOB: 11/11/1970, 44 y.o.   MRN: [id number]  HPI The patient comes in today for follow-up of her obstructive sleep apnea. She is wearing C Pap fairly compliantly, but continues to have issues with her mask fit. When she is able to wear her device through the night, she sees a difference in her sleep. She sometimes feels the pressure is not high enough, but she is on the auto setting. Her weight is stable from the last visit.   Review of Systems  Constitutional: Negative for fever and unexpected weight change.  HENT: Negative for congestion, dental problem, ear pain, nosebleeds, postnasal drip, rhinorrhea, sinus pressure, sneezing, sore throat and trouble swallowing.   Eyes: Negative for redness and itching.  Respiratory: Negative for cough, chest tightness, shortness of breath and wheezing.   Cardiovascular: Negative for palpitations and leg swelling.  Gastrointestinal: Negative for nausea and vomiting.  Genitourinary: Negative for dysuria.  Musculoskeletal: Negative for joint swelling.  Skin: Negative for rash.  Neurological: Negative for headaches.  Hematological: Does not bruise/bleed easily.  Psychiatric/Behavioral: Negative for dysphoric mood. The patient is not nervous/anxious.        Objective:   Physical Exam Obese female in no acute distress Nose without purulence or discharge noted No skin breakdown or pressure necrosis from the C Pap mask Neck without lymphadenopathy or thyromegaly Lower extremities with mild edema, no cyanosis Alert and oriented, moves all 4 extremities.       Assessment & Plan:

## 2014-11-17 ENCOUNTER — Ambulatory Visit (HOSPITAL_BASED_OUTPATIENT_CLINIC_OR_DEPARTMENT_OTHER): Payer: Medicaid Other | Attending: Pulmonary Disease | Admitting: Radiology

## 2014-11-17 DIAGNOSIS — Z9989 Dependence on other enabling machines and devices: Principal | ICD-10-CM

## 2014-11-17 DIAGNOSIS — G4733 Obstructive sleep apnea (adult) (pediatric): Secondary | ICD-10-CM

## 2015-01-08 ENCOUNTER — Encounter: Payer: Medicaid Other | Attending: Family Medicine | Admitting: *Deleted

## 2015-01-08 ENCOUNTER — Encounter: Payer: Self-pay | Admitting: *Deleted

## 2015-01-08 VITALS — Ht 63.0 in | Wt 246.1 lb

## 2015-01-08 DIAGNOSIS — E119 Type 2 diabetes mellitus without complications: Secondary | ICD-10-CM | POA: Insufficient documentation

## 2015-01-08 DIAGNOSIS — Z713 Dietary counseling and surveillance: Secondary | ICD-10-CM | POA: Diagnosis not present

## 2015-01-08 NOTE — Patient Instructions (Signed)
Plan:  Aim for 2- 3 Carb Choices per meal (30-45 grams) +/- 1 either way  Aim for 0-1 Carbs per snack if hungry  Include protein in moderation with your meals and snacks Continue reading food labels for Total Carbohydrate of foods Continue checking BG at alternate times per day if OK with MD  Consider checking out some places that have a pool and consider pool walking as well as taking the stairs to increase your activity level as tolerated Consider checking your BG after these activities so you can see the benefit! Continue taking medication as directed by MD Doristine Devoid job!

## 2015-01-09 ENCOUNTER — Emergency Department (HOSPITAL_COMMUNITY)
Admission: EM | Admit: 2015-01-09 | Discharge: 2015-01-09 | Disposition: A | Discharge status: Home / Self Care | Payer: Medicaid Other | Attending: Emergency Medicine | Admitting: Emergency Medicine

## 2015-01-09 ENCOUNTER — Encounter (HOSPITAL_COMMUNITY): Payer: Self-pay

## 2015-01-09 DIAGNOSIS — Z85858 Personal history of malignant neoplasm of other endocrine glands: Secondary | ICD-10-CM | POA: Insufficient documentation

## 2015-01-09 DIAGNOSIS — F329 Major depressive disorder, single episode, unspecified: Secondary | ICD-10-CM | POA: Insufficient documentation

## 2015-01-09 DIAGNOSIS — E780 Pure hypercholesterolemia: Secondary | ICD-10-CM | POA: Insufficient documentation

## 2015-01-09 DIAGNOSIS — K219 Gastro-esophageal reflux disease without esophagitis: Secondary | ICD-10-CM | POA: Diagnosis not present

## 2015-01-09 DIAGNOSIS — Z7982 Long term (current) use of aspirin: Secondary | ICD-10-CM | POA: Insufficient documentation

## 2015-01-09 DIAGNOSIS — E1165 Type 2 diabetes mellitus with hyperglycemia: Secondary | ICD-10-CM | POA: Diagnosis not present

## 2015-01-09 DIAGNOSIS — Z862 Personal history of diseases of the blood and blood-forming organs and certain disorders involving the immune mechanism: Secondary | ICD-10-CM | POA: Diagnosis not present

## 2015-01-09 DIAGNOSIS — Z8541 Personal history of malignant neoplasm of cervix uteri: Secondary | ICD-10-CM | POA: Diagnosis not present

## 2015-01-09 DIAGNOSIS — R739 Hyperglycemia, unspecified: Secondary | ICD-10-CM

## 2015-01-09 DIAGNOSIS — Z79899 Other long term (current) drug therapy: Secondary | ICD-10-CM | POA: Insufficient documentation

## 2015-01-09 DIAGNOSIS — R358 Other polyuria: Secondary | ICD-10-CM | POA: Diagnosis present

## 2015-01-09 DIAGNOSIS — E785 Hyperlipidemia, unspecified: Secondary | ICD-10-CM | POA: Insufficient documentation

## 2015-01-09 LAB — BASIC METABOLIC PANEL
Anion gap: 8 (ref 5–15)
BUN: 8 mg/dL (ref 6–20)
CO2: 26 mmol/L (ref 22–32)
Calcium: 8.6 mg/dL — ABNORMAL LOW (ref 8.9–10.3)
Chloride: 103 mmol/L (ref 101–111)
Creatinine, Ser: 0.83 mg/dL (ref 0.44–1.00)
GFR calc Af Amer: >60 mL/min (ref >60)
GFR calc non Af Amer: >60 mL/min (ref >60)
Glucose, Bld: 321 mg/dL — ABNORMAL HIGH (ref 65–99)
Potassium: 3.6 mmol/L (ref 3.5–5.1)
Sodium: 137 mmol/L (ref 135–145)

## 2015-01-09 LAB — URINE MICROSCOPIC-ADD ON

## 2015-01-09 LAB — CBC
HCT: 43.2 % (ref 36.0–46.0)
Hemoglobin: 14 g/dL (ref 12.0–15.0)
MCH: 28.1 pg (ref 26.0–34.0)
MCHC: 32.4 g/dL (ref 30.0–36.0)
MCV: 86.6 fL (ref 78.0–100.0)
Platelets: 122 10*3/uL — ABNORMAL LOW (ref 150–400)
RBC: 4.99 MIL/uL (ref 3.87–5.11)
RDW: 12.8 % (ref 11.5–15.5)
WBC: 5.5 10*3/uL (ref 4.0–10.5)

## 2015-01-09 LAB — CBG MONITORING, ED
Glucose-Capillary: 341 mg/dL — ABNORMAL HIGH (ref 65–99)
Glucose-Capillary: 220 mg/dL — ABNORMAL HIGH (ref 65–99)

## 2015-01-09 LAB — URINALYSIS, ROUTINE W REFLEX MICROSCOPIC
Bilirubin Urine: NEGATIVE
Glucose, UA: >1000 mg/dL — AB
Hgb urine dipstick: NEGATIVE
Ketones, ur: NEGATIVE mg/dL
Leukocytes, UA: NEGATIVE
Nitrite: NEGATIVE
Protein, ur: NEGATIVE mg/dL
Specific Gravity, Urine: 1.043 — ABNORMAL HIGH (ref 1.005–1.030)
Urobilinogen, UA: 0.2 mg/dL (ref 0.0–1.0)
pH: 6 (ref 5.0–8.0)

## 2015-01-09 MED ORDER — INSULIN ASPART 100 UNIT/ML ~~LOC~~ SOLN
5.0000 [IU] | Freq: Once | SUBCUTANEOUS | Status: AC
  Administered 2015-01-09: 5 [IU] via SUBCUTANEOUS
  Filled 2015-01-09: qty 1

## 2015-01-09 NOTE — ED Provider Notes (Signed)
CSN: [id number]     Arrival date & time 01/09/15  0252 History   This chart was scribed for Ripley Fraise, MD by Forrestine Him, ED Scribe. This patient was seen in room WA05/WA05 and the patient's care was started 3:47 AM.   Chief Complaint  Patient presents with  . Hyperglycemia   The history is provided by the patient. No language interpreter was used.    HPI Comments: Rhonda Higgins is a 44 y.o. female with a PMHx DM and hyperglycemia who presents to the Emergency Department here for hyperglycemia this morning. Pt states she noticed sudden onset, constant polyuria and polydipsia that woke her from sleep which she typically attributes to high blood sugar levels. Blood sugar this evening 324 prior to arrival. Currently blood levels are controlled with oral medications. She denies any previous history of insulin therapy. An extra dose of Gimepride taken at approximetely 8:30 PM last night after having a [place] food dinner. [name] states she was advised to come to the Emergency Department if her blood sugars are ever elevated above 300.  Past Medical History  Diagnosis Date  . Allergic rhinitis   . Hyperlipidemia   . Diabetes mellitus   . Personality disorder   . Overactive bladder   . UTI (urinary tract infection)   . Depression   . GERD (gastroesophageal reflux disease)   . Non-insulin dependent type 2 diabetes mellitus   . Anemia   . Elevated cholesterol   . Right lower quadrant pain   . Polyphagia   . Uterine cancer   . Adenocarcinoma     endometrial, FIGO GRADE 1  . Kidney stone 6/13    H/O  . 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 Inner ear surgery      L ear d/t tumor and R ear d/t hole in ear drum  . Tonsillectomy      age 36  . Total abdominal hysterectomy  feb 2011    BSO, node dissection  . Endometrial biopsy     Family History  Problem Relation Age of Onset  . Asthma Mother   .  Heart disease Mother   . Diabetes Mother   . Emphysema Mother   . Hypertension Mother   . Stroke Mother   . Prostate cancer Father   . Heart disease Sister   . Heart attack Brother   . Heart disease Brother   . Heart disease Sister   . Diabetes Sister    History  Substance Use Topics  . Smoking status: Never Smoker   . Smokeless tobacco: Never Used  . Alcohol Use: Yes   OB History    Gravida Para Term Preterm AB TAB SAB Ectopic Multiple Living   0              Review of Systems  Constitutional: Negative for fever and chills.  Respiratory: Negative for shortness of breath.   Cardiovascular: Negative for chest pain and leg swelling.  Gastrointestinal: Negative for nausea, vomiting and abdominal pain.  Endocrine: Positive for polydipsia and polyuria.  Psychiatric/Behavioral: Negative for confusion.  All other systems reviewed and are negative.     Allergies  Hydrocodone-acetaminophen and Codeine  Home Medications   Prior to Admission medications   Medication Sig Start Date End Date Taking? Authorizing Provider  aspirin 81 MG tablet Take 162 mg by mouth every evening.     Historical Provider, MD  buPROPion (  WELLBUTRIN XL) 150 MG 24 hr tablet Take 150 mg by mouth at bedtime.     Historical Provider, MD  carvedilol (COREG) 3.125 MG tablet Take 1 tablet (3.125 mg total) by mouth 2 (two) times daily with a meal. 07/20/14   Oswald Hillock, MD  cetirizine (ZYRTEC) 10 MG tablet Take 10 mg by mouth at bedtime.     Historical Provider, MD  escitalopram (LEXAPRO) 5 MG tablet Take 5 mg by mouth at bedtime.     Historical Provider, MD  gabapentin (NEURONTIN) 400 MG capsule Take 400-1,200 mg by mouth 3 (three) times daily. Take 1 Tablet by mouth in the am, Take 1 Tablet by mouth in the afternoon and Take 3 Tablets by mouth at bedtime.    Historical Provider, MD  glimepiride (AMARYL) 2 MG tablet Take 4 mg by mouth 2 (two) times daily with a meal.     Historical Provider, MD  ibuprofen  (ADVIL,MOTRIN) 200 MG tablet Take 400 mg by mouth every 6 (six) hours as needed for moderate pain (pain).     Historical Provider, MD  lisinopril (PRINIVIL,ZESTRIL) 5 MG tablet Take 5 mg by mouth daily.    Historical Provider, MD  metFORMIN (GLUCOPHAGE) 850 MG tablet Take 850 mg by mouth 2 (two) times daily with a meal.    Historical Provider, MD  Multiple Vitamin (MULTIVITAMIN) tablet Take 1 tablet by mouth daily.    Historical Provider, MD  omeprazole (PRILOSEC) 40 MG capsule Take 40 mg by mouth at bedtime.     Historical Provider, MD  risperiDONE (RISPERDAL) 2 MG tablet Take 2-4 mg by mouth 2 (two) times daily. Take 2 Tablets by mouth in the morning and Take 1 Tablet by mouth at night.    Historical Provider, MD  simvastatin (ZOCOR) 40 MG tablet Take 40 mg by mouth every evening.    Historical Provider, MD  traZODone (DESYREL) 100 MG tablet Take 200 mg by mouth at bedtime as needed for sleep.     Historical Provider, MD   Triage Vitals: BP 139/70 mmHg  Pulse 90  Temp(Src) 98.2 F (36.8 C)  Resp 20  SpO2 97%   Physical Exam  CONSTITUTIONAL: Well developed/well nourished HEAD: Normocephalic/atraumatic EYES: EOMI/PERRL ENMT: Mucous membranes moist NECK: supple no meningeal signs SPINE/BACK:entire spine nontender CV: S1/S2 noted, no murmurs/rubs/gallops noted LUNGS: Lungs are clear to auscultation bilaterally, no apparent distress ABDOMEN: soft, nontender, no rebound or guarding, bowel sounds noted throughout abdomen GU:no cva tenderness NEURO: Pt is awake/alert/appropriate, moves all extremitiesx4.  No facial droop.   EXTREMITIES: pulses normal/equal, full ROM SKIN: warm, color normal PSYCH: no abnormalities of mood noted, alert and oriented to situation   ED Course  Procedures   DIAGNOSTIC STUDIES: Oxygen Saturation is 97% on RA, adequate by my interpretation.    COORDINATION OF CARE: 3:57 AM- Will order CBC, urinalysis, and CBG. Discussed treatment plan with pt at bedside  and pt agreed to plan.    6:00 AM Pt improved Advised to monitor glucose throughout weekend and call PCP in 2 days Pt is not in DKA Stable for d/c home at this time  Labs Review Labs Reviewed  BASIC METABOLIC PANEL - Abnormal; Notable for the following:    Glucose, Bld 321 (*)    Calcium 8.6 (*)    All other components within normal limits  CBC - Abnormal; Notable for the following:    Platelets 122 (*)    All other components within normal limits  URINALYSIS, ROUTINE W  REFLEX MICROSCOPIC (NOT AT [facility]) - Abnormal; Notable for the following:    Specific Gravity, Urine 1.043 (*)    Glucose, UA >1000 (*)    All other components within normal limits  CBG MONITORING, ED - Abnormal; Notable for the following:    Glucose-Capillary 341 (*)    All other components within normal limits  CBG MONITORING, ED - Abnormal; Notable for the following:    Glucose-Capillary 220 (*)    All other components within normal limits  URINE MICROSCOPIC-ADD ON  CBG MONITORING, ED   Medications  insulin aspart (novoLOG) injection 5 Units (5 Units Subcutaneous Given 01/09/15 0447)    MDM   Final diagnoses:  Hyperglycemia    Nursing notes including past medical history and social history reviewed and considered in documentation Labs/vital reviewed myself and considered during evaluation    I personally performed the services described in this documentation, which was scribed in my presence. The recorded information has been reviewed and is accurate.      Ripley Fraise, MD 01/09/15 [phone]

## 2015-01-09 NOTE — ED Notes (Signed)
Patient is a diabetic who uses oral medications to control her glucose level.  She reports last night she ate [place] food so she took an extra Glimeperide prior to going to bed.  She awoke just PTA because she had to urinate and checked her CBG: 324.  She reports her PCP advised her to come to ED if it is ever higher than 300.  CBG in triage: 341

## 2015-01-09 NOTE — ED Notes (Signed)
EDP at bedside  

## 2015-01-09 NOTE — Discharge Instructions (Signed)

## 2015-01-15 NOTE — Progress Notes (Signed)
Diabetes Self-Management Education  Visit Type:  Follow-up  Appt. Start Time: 0930 Appt. End Time: 1000  01/15/2015  [name], identified by name and date of birth, is a 44 y.o. female with a diagnosis of Diabetes:  .  Other people present during visit:      ASSESSMENT  Height 5\' 3"  (1.6 m), weight 246 lb 1.6 oz (111.63 kg). Body mass index is 43.61 kg/(m^2).    Subsequent Visit Information:  Since your last visit, have you continued or began the use of a meal plan?: Yes How many days a week are you following a meal plan?: 6 Since your last visit, have you continued or began to exercise on a consistent basis?: No Since your last visit have you continued or begun to take your medications as prescribed?: Yes Since your last visit have you experienced any weight changes?: Loss Weight Loss (lbs): 3 Since your last visit, are you checking your blood glucose at least once a day?: Yes  Psychosocial:     Patient Belief/Attitude about Diabetes: Motivated to manage diabetes (Seeing success in management, less frustrated) Self-care barriers: None Self-management support: Family Patient Concerns: Nutrition/Meal planning, Weight Control, Glycemic Control, Healthy Lifestyle Special Needs: None Learning Readiness: Change in progress  Complications:   Last HgB A1C per patient/outside source: 6.9 % How often do you check your blood sugar?: 1-2 times/day Fasting Blood glucose range (mg/dL): 130-179 Postprandial Blood glucose range (mg/dL): 130-179 Number of hypoglycemic episodes per month: 0  Diet Intake:  Breakfast: 2 toast with PNB OR boiled eggs OR black coffee  Snack (morning): occasionally fresh fruit Lunch: skips once or twice a week OR soup, maybe crackers OR no more sandwiches, water or flavored water Snack (afternoon): fresh fruit if anything Dinner: meat, starch, vegetables, water Snack (evening): fresh fruit or rice cakes Beverage(s): coffee, water or  flavored water  Exercise:  Exercise: ADL's (stairs to take trash out, routine house work)   Individualized Plan for Diabetes Self-Management Training:   Learning Objective:  Patient will have a greater understanding of diabetes self-management.  Patient education plan per assessed needs and concerns is to attend individual sessions for    Education Topics Reviewed with Patient Today:    Reviewed blood glucose goals for pre and post meals and how to evaluate the patients' food intake on their blood glucose level., Meal options for control of blood glucose level and chronic complications. Role of exercise on diabetes management, blood pressure control and cardiac health., Helped patient identify appropriate exercises in relation to his/her diabetes, diabetes complications and other health issue.   Identified appropriate SMBG and/or A1C goals.     Role of stress on diabetes, Helped patient identify a support system for diabetes management   Lifestyle issues that need to be addressed for better diabetes care (worried about potential move by the end of this year)  PATIENTS GOALS/Plan (Developed by the patient):  Nutrition: Follow meal plan discussed Physical Activity: Exercise 1-2 times per week (consider swimming options and test BG after exercise to see improvement) Monitoring : test my blood glucose as discussed (note x per day with comment)  Patient Self Evaluation of Goals - Patient rates self as meeting previously set goals:   Nutrition: 50 - 75 % Physical Activity: < 25% Medications: >75% Monitoring: >75% Health Coping: 50 - 75 %   Plan:   Patient Instructions  Plan:  Aim for 2- 3 Carb Choices per meal (30-45 grams) +/- 1 either way  Aim  for 0-1 Carbs per snack if hungry  Include protein in moderation with your meals and snacks Continue reading food labels for Total Carbohydrate of foods Continue checking BG at alternate times per day if OK with MD  Consider  checking out some places that have a pool and consider pool walking as well as taking the stairs to increase your activity level as tolerated Consider checking your BG after these activities so you can see the benefit! Continue taking medication as directed by MD Doristine Devoid job!       Expected Outcomes:  Demonstrated interest in learning. Expect positive outcomes  Education material provided: Exercise and pool option handouts  If problems or questions, patient to contact team via:  Phone and Email  Future DSME appointment: - 3-4 months

## 2015-01-15 NOTE — Progress Notes (Signed)
Weighed patient on Tanita Scale this visit to assess % body fat prior to starting exercise:  TANITA  BODY COMP RESULTS: 01/12/15  Weight (lbs) 245.0   BMI (kg/m^2) 43.4   Fat Mass (lbs) 127.0   Fat Free Mass (lbs) 118.0   Total Body Water (lbs) 86.5

## 2015-03-20 ENCOUNTER — Encounter (HOSPITAL_COMMUNITY): Payer: Self-pay | Admitting: Emergency Medicine

## 2015-03-20 ENCOUNTER — Emergency Department (HOSPITAL_COMMUNITY)
Admission: EM | Admit: 2015-03-20 | Discharge: 2015-03-20 | Disposition: A | Discharge status: Home / Self Care | Payer: Medicaid Other | Attending: Emergency Medicine | Admitting: Emergency Medicine

## 2015-03-20 ENCOUNTER — Emergency Department (HOSPITAL_COMMUNITY): Payer: Medicaid Other

## 2015-03-20 DIAGNOSIS — Z87440 Personal history of urinary (tract) infections: Secondary | ICD-10-CM | POA: Diagnosis not present

## 2015-03-20 DIAGNOSIS — Z87442 Personal history of urinary calculi: Secondary | ICD-10-CM | POA: Insufficient documentation

## 2015-03-20 DIAGNOSIS — F329 Major depressive disorder, single episode, unspecified: Secondary | ICD-10-CM | POA: Diagnosis not present

## 2015-03-20 DIAGNOSIS — E785 Hyperlipidemia, unspecified: Secondary | ICD-10-CM | POA: Diagnosis not present

## 2015-03-20 DIAGNOSIS — E119 Type 2 diabetes mellitus without complications: Secondary | ICD-10-CM | POA: Insufficient documentation

## 2015-03-20 DIAGNOSIS — E7800 Pure hypercholesterolemia, unspecified: Secondary | ICD-10-CM | POA: Insufficient documentation

## 2015-03-20 DIAGNOSIS — R1033 Periumbilical pain: Secondary | ICD-10-CM | POA: Diagnosis not present

## 2015-03-20 DIAGNOSIS — Z87448 Personal history of other diseases of urinary system: Secondary | ICD-10-CM | POA: Insufficient documentation

## 2015-03-20 DIAGNOSIS — R1012 Left upper quadrant pain: Secondary | ICD-10-CM | POA: Insufficient documentation

## 2015-03-20 DIAGNOSIS — K219 Gastro-esophageal reflux disease without esophagitis: Secondary | ICD-10-CM | POA: Diagnosis not present

## 2015-03-20 DIAGNOSIS — R109 Unspecified abdominal pain: Secondary | ICD-10-CM

## 2015-03-20 DIAGNOSIS — Z862 Personal history of diseases of the blood and blood-forming organs and certain disorders involving the immune mechanism: Secondary | ICD-10-CM | POA: Insufficient documentation

## 2015-03-20 DIAGNOSIS — Z79899 Other long term (current) drug therapy: Secondary | ICD-10-CM | POA: Diagnosis not present

## 2015-03-20 DIAGNOSIS — Z7982 Long term (current) use of aspirin: Secondary | ICD-10-CM | POA: Diagnosis not present

## 2015-03-20 DIAGNOSIS — Z8542 Personal history of malignant neoplasm of other parts of uterus: Secondary | ICD-10-CM | POA: Insufficient documentation

## 2015-03-20 LAB — URINALYSIS, ROUTINE W REFLEX MICROSCOPIC
Bilirubin Urine: NEGATIVE
Glucose, UA: NEGATIVE mg/dL
Hgb urine dipstick: NEGATIVE
Ketones, ur: 15 mg/dL — AB
Leukocytes, UA: NEGATIVE
Nitrite: NEGATIVE
Protein, ur: NEGATIVE mg/dL
Specific Gravity, Urine: 1.021 (ref 1.005–1.030)
Urobilinogen, UA: 0.2 mg/dL (ref 0.0–1.0)
pH: 7 (ref 5.0–8.0)

## 2015-03-20 LAB — CBC WITH DIFFERENTIAL/PLATELET
Basophils Absolute: 0 10*3/uL (ref 0.0–0.1)
Basophils Relative: 0 %
Eosinophils Absolute: 0.1 10*3/uL (ref 0.0–0.7)
Eosinophils Relative: 2 %
HCT: 42 % (ref 36.0–46.0)
Hemoglobin: 14 g/dL (ref 12.0–15.0)
Lymphocytes Relative: 19 %
Lymphs Abs: 1.2 10*3/uL (ref 0.7–4.0)
MCH: 28.9 pg (ref 26.0–34.0)
MCHC: 33.3 g/dL (ref 30.0–36.0)
MCV: 86.6 fL (ref 78.0–100.0)
Monocytes Absolute: 0.4 10*3/uL (ref 0.1–1.0)
Monocytes Relative: 7 %
Neutro Abs: 4.4 10*3/uL (ref 1.7–7.7)
Neutrophils Relative %: 72 %
Platelets: 119 10*3/uL — ABNORMAL LOW (ref 150–400)
RBC: 4.85 MIL/uL (ref 3.87–5.11)
RDW: 13 % (ref 11.5–15.5)
WBC: 6.2 10*3/uL (ref 4.0–10.5)

## 2015-03-20 LAB — COMPREHENSIVE METABOLIC PANEL
ALT: 57 U/L — ABNORMAL HIGH (ref 14–54)
AST: 46 U/L — ABNORMAL HIGH (ref 15–41)
Albumin: 3.9 g/dL (ref 3.5–5.0)
Alkaline Phosphatase: 88 U/L (ref 38–126)
Anion gap: 10 (ref 5–15)
BUN: 14 mg/dL (ref 6–20)
CO2: 26 mmol/L (ref 22–32)
Calcium: 8.7 mg/dL — ABNORMAL LOW (ref 8.9–10.3)
Chloride: 105 mmol/L (ref 101–111)
Creatinine, Ser: 0.74 mg/dL (ref 0.44–1.00)
GFR calc Af Amer: >60 mL/min (ref >60)
GFR calc non Af Amer: >60 mL/min (ref >60)
Glucose, Bld: 140 mg/dL — ABNORMAL HIGH (ref 65–99)
Potassium: 3.7 mmol/L (ref 3.5–5.1)
Sodium: 141 mmol/L (ref 135–145)
Total Bilirubin: 0.4 mg/dL (ref 0.3–1.2)
Total Protein: 6.8 g/dL (ref 6.5–8.1)

## 2015-03-20 LAB — LIPASE, BLOOD: Lipase: 61 U/L — ABNORMAL HIGH (ref 22–51)

## 2015-03-20 IMAGING — CT CT ABD-PELV W/ CM
2 of 5 series · 17 of 46 positions shown, 19 images · IV contrast (OMNIPAQUE 300)
Comparison: CT abdomen [DATE]

CLINICAL DATA: Abdominal pain.  Elevated lipase

EXAM:
CT ABDOMEN AND PELVIS WITH CONTRAST
TECHNIQUE: Multidetector CT imaging of the abdomen and pelvis was performed
using the standard protocol following bolus administration of
intravenous contrast.
CONTRAST:  100mL OMNIPAQUE IOHEXOL 300 MG/ML  SOLN

[Series 2: abd/pel with · axial · 0.96mm/px · z∈[+1235,+1635]mm · 14 of 91 slices shown, 16 images]
[im 6/91  soft-tissue]
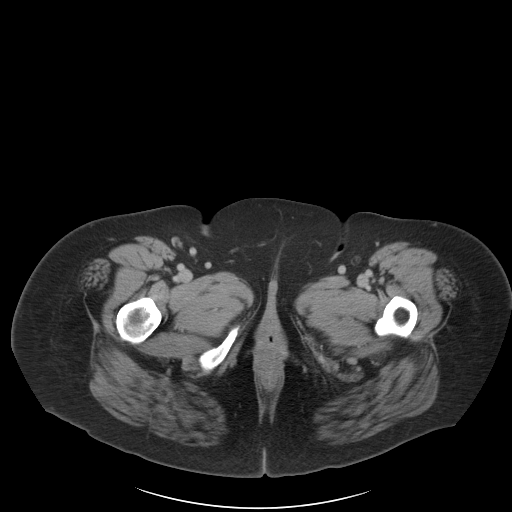
[im 6/91  bone]
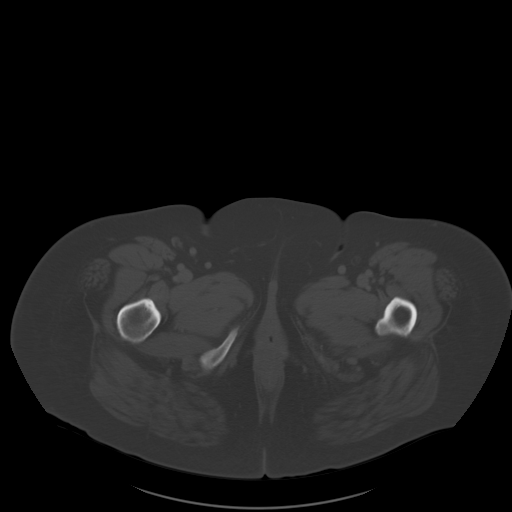
[im 11/91  soft-tissue]
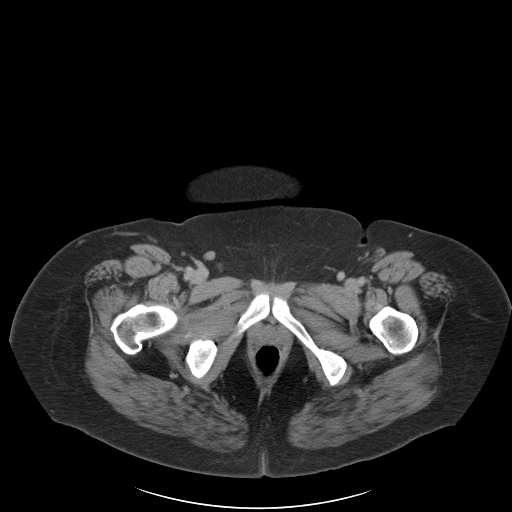
[im 21/91  soft-tissue]
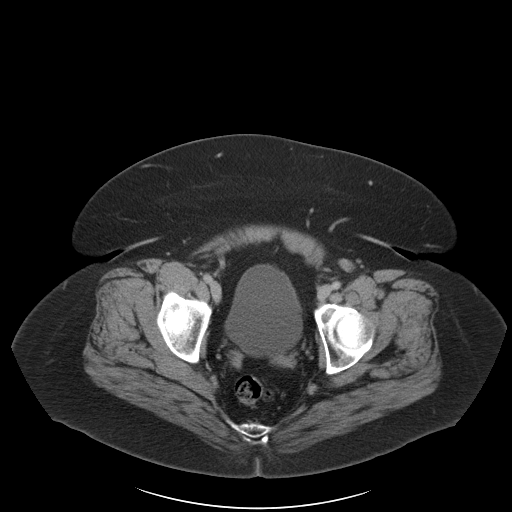
[im 26/91  soft-tissue]
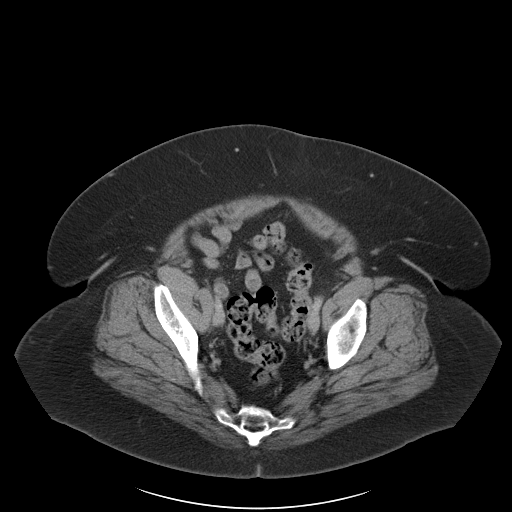
[im 31/91  soft-tissue]
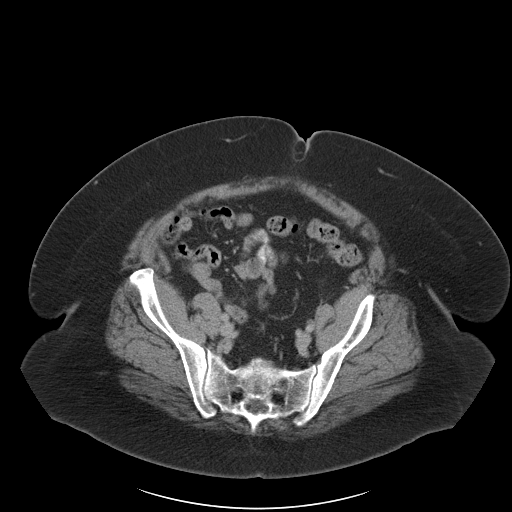
[im 36/91  soft-tissue]
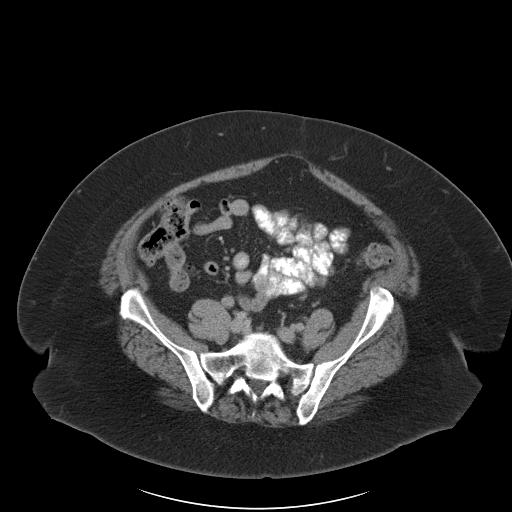
[im 41/91  soft-tissue]
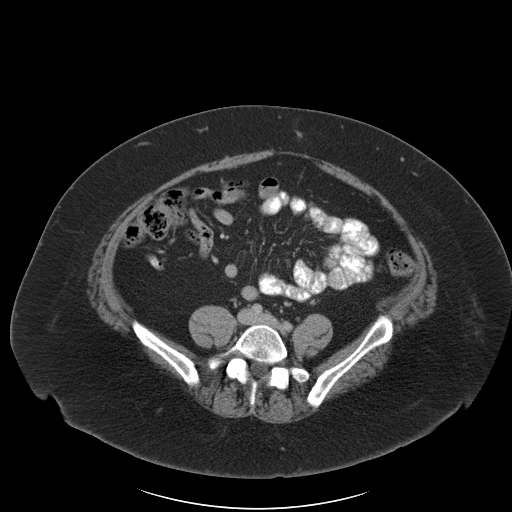
[im 51/91  soft-tissue]
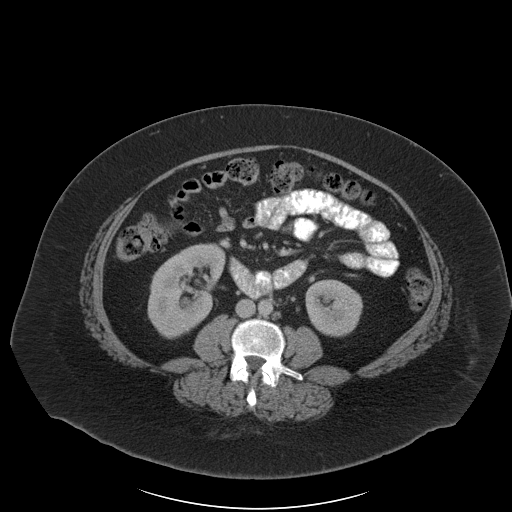
[im 56/91  soft-tissue]
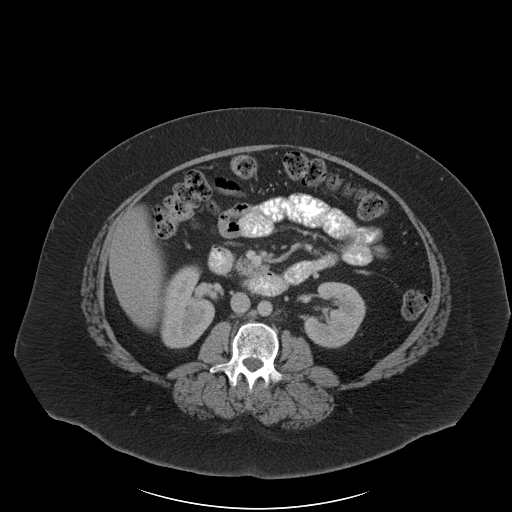
[im 56/91  bone]
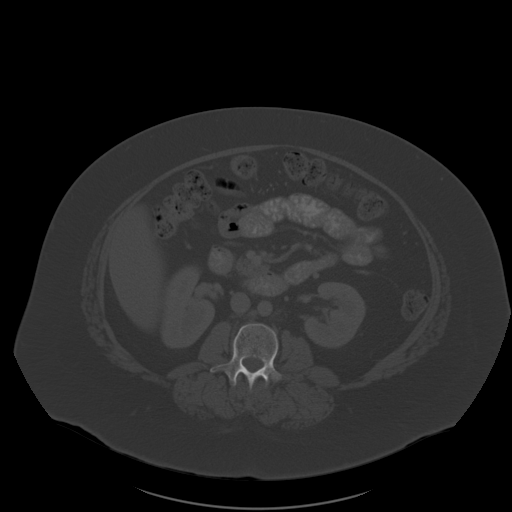
[im 61/91  soft-tissue]
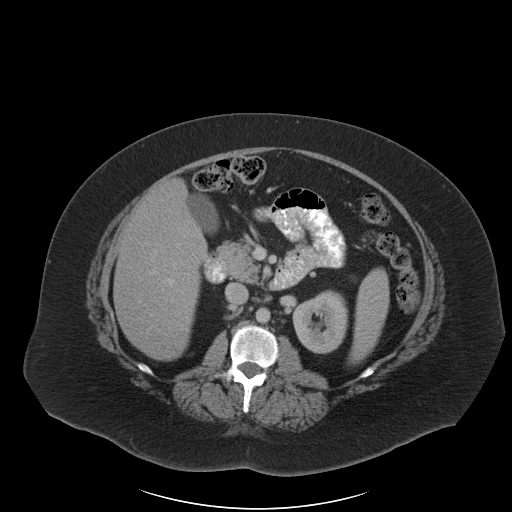
[im 66/91  soft-tissue]
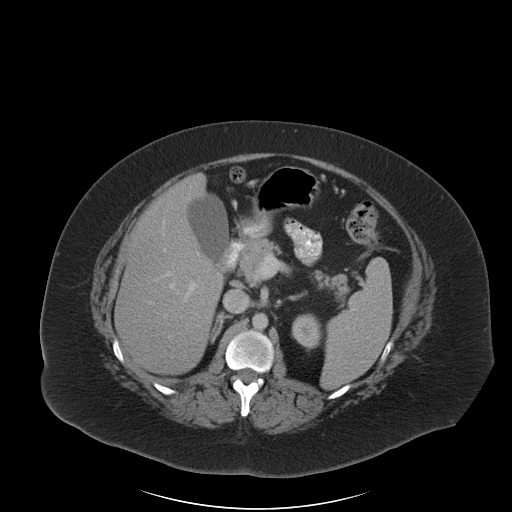
[im 71/91  soft-tissue]
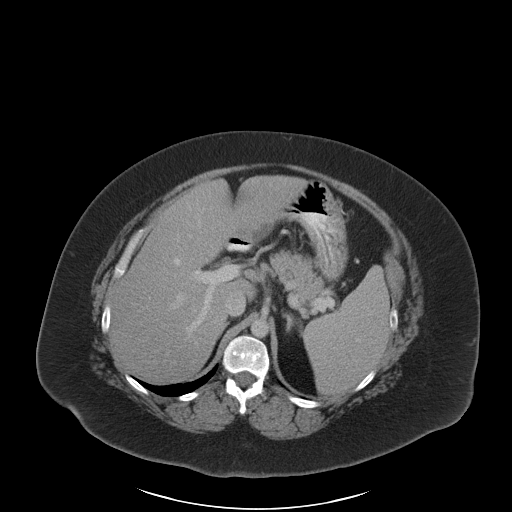
[im 81/91  soft-tissue]
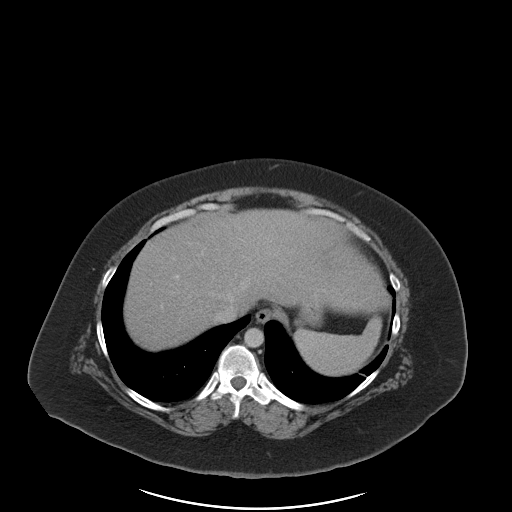
[im 86/91  soft-tissue]
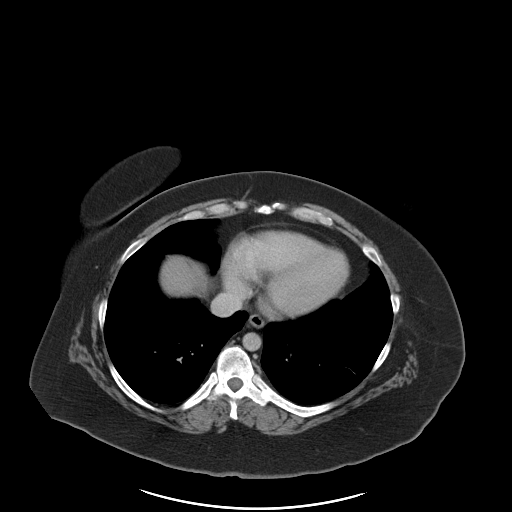

[Series 5: coronal a/|p · coronal · 0.78mm/px · 3 of 127 slices shown]
[im 43/127  soft-tissue]
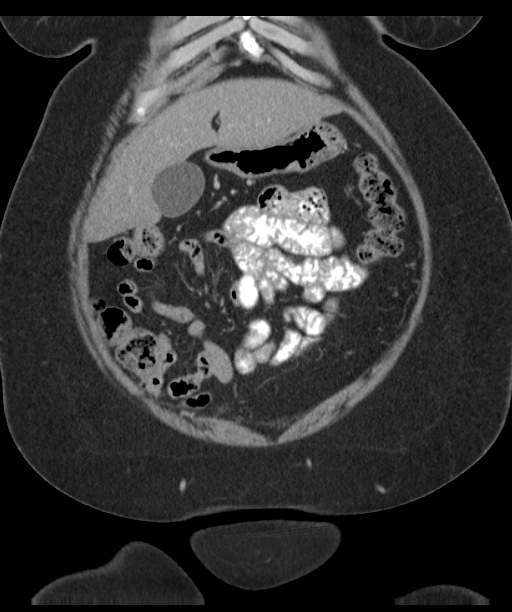
[im 57/127  soft-tissue]
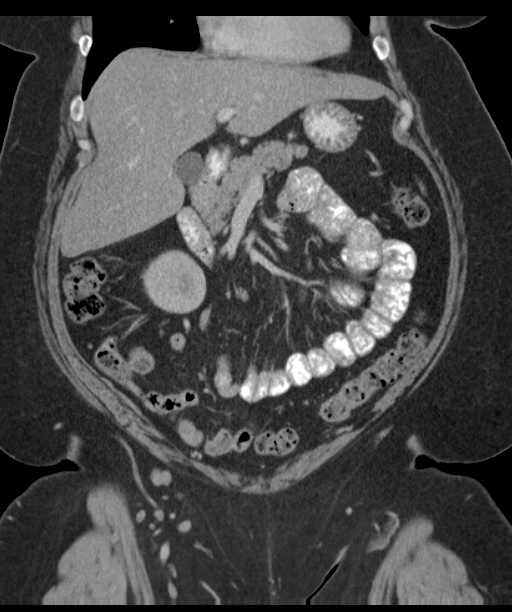
[im 71/127  soft-tissue]
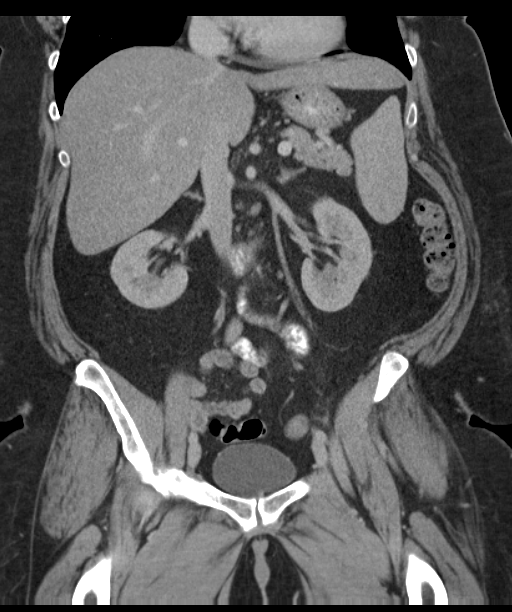

[17 of 46 positions shown; findings below may reference images not displayed]

FINDINGS: Lower chest: Mild linear scarring in the lung bases is unchanged. No
infiltrate or effusion. Heart size normal.

Hepatobiliary: Liver is homogeneous without focal lesion.
Gallbladder and bile ducts normal.

Pancreas: Normal pancreas. No pancreatic mass or edema or
calcification.

Spleen: Normal spleen. Accessory spleen inferiorly is unchanged from
the prior study

Adrenals/Urinary Tract: Negative for renal mass obstruction.
Nonobstructing 4 mm stone right lower pole. Urinary bladder normal.

Stomach/Bowel: Negative for bowel obstruction. No bowel edema.
Normal appendix. Negative for diverticulitis.

Vascular/Lymphatic: Negative

Reproductive: Hysterectomy changes.  No pelvic mass

Other: Negative for ascites.  No adenopathy.

Musculoskeletal: Negative
IMPRESSION: No acute abnormality.  Normal appendix

4 mm nonobstructing stone left lower pole.

## 2015-03-20 MED ORDER — ONDANSETRON HCL 4 MG/2ML IJ SOLN
4.0000 mg | Freq: Once | INTRAMUSCULAR | Status: AC
  Administered 2015-03-20: 4 mg via INTRAVENOUS
  Filled 2015-03-20: qty 2

## 2015-03-20 MED ORDER — IOHEXOL 300 MG/ML  SOLN
100.0000 mL | Freq: Once | INTRAMUSCULAR | Status: AC | PRN
  Administered 2015-03-20: 100 mL via INTRAVENOUS

## 2015-03-20 MED ORDER — SODIUM CHLORIDE 0.9 % IV SOLN
1000.0000 mL | Freq: Once | INTRAVENOUS | Status: AC
  Administered 2015-03-20: 1000 mL via INTRAVENOUS

## 2015-03-20 MED ORDER — IOHEXOL 300 MG/ML  SOLN
25.0000 mL | Freq: Once | INTRAMUSCULAR | Status: DC | PRN
  Administered 2015-03-20: 25 mL via ORAL
  Filled 2015-03-20: qty 30

## 2015-03-20 NOTE — ED Notes (Signed)
Patient complains of sharp intermittent abdominal pain that start last night around 10pm.  Denies nausea and vomiting.

## 2015-03-20 NOTE — Discharge Instructions (Signed)

## 2015-03-20 NOTE — ED Notes (Signed)
Patient transported to CT 

## 2015-03-20 NOTE — ED Provider Notes (Signed)
CSN: [id number]     Arrival date & time 03/20/15  1333 History   First MD Initiated Contact with Patient 03/20/15 1345     Chief Complaint  Patient presents with  . Abdominal Pain   Patient is a 44 y.o. female presenting with abdominal pain. The history is provided by the patient.  Abdominal Pain Pain location: primarily left sided mostly upper and some para umbilical. Pain quality: sharp   Pain radiates to:  Does not radiate Pain severity:  Moderate Duration:  1 day Timing:  Constant Chronicity:  New Context: previous surgery   Context: not diet changes, not recent illness, not recent travel, not sick contacts and not trauma   Relieved by:  Nothing Ineffective treatments:  NSAIDs Associated symptoms: no anorexia, no chills, no cough, no diarrhea, no dysuria, no fever, no shortness of breath and no vomiting   Risk factors: obesity    patient has a history of a total abdominal hysterectomy. This was performed for uterine cancer. Patient states symptoms started yesterday. Pain has been persistent. She denies any trouble with fevers vomiting diarrhea or change in her appetite. Eating or drinking does not seem to exacerbate the symptoms. She denies any trouble with vaginal discharge or bleeding.  Past Medical History  Diagnosis Date  . Allergic rhinitis   . Hyperlipidemia   . Diabetes mellitus   . Personality disorder   . Overactive bladder   . UTI (urinary tract infection)   . Depression   . GERD (gastroesophageal reflux disease)   . Non-insulin dependent type 2 diabetes mellitus ([place])   . Anemia   . Elevated cholesterol   . Right lower quadrant pain   . Polyphagia   . Uterine cancer ([place])   . 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 Kidney stone 6/13    H/O  . 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 Inner ear surgery      L ear d/t tumor and R ear d/t hole in ear drum  . Tonsillectomy     age 34  . Total abdominal hysterectomy  feb 2011    BSO, node dissection  . Endometrial biopsy     Family History  Problem Relation Age of Onset  . Asthma Mother   . Heart disease Mother   . Diabetes Mother   . Emphysema Mother   . Hypertension Mother   . Stroke Mother   . Prostate cancer Father   . Heart disease Sister   . Heart attack Brother   . Heart disease Brother   . Heart disease Sister   . Diabetes Sister    Social History  Substance Use Topics  . Smoking status: Never Smoker   . Smokeless tobacco: Never Used  . Alcohol Use: Yes   OB History    Gravida Para Term Preterm AB TAB SAB Ectopic Multiple Living   0              Review of Systems  Constitutional: Negative for fever and chills.  Respiratory: Negative for cough and shortness of breath.   Gastrointestinal: Positive for abdominal pain. Negative for vomiting, diarrhea and anorexia.  Genitourinary: Negative for dysuria.  All other systems reviewed and are negative.     Allergies  Hydrocodone-acetaminophen and Codeine  Home Medications   Prior to Admission medications   Medication Sig Start Date End Date Taking? Authorizing Provider  aspirin 81  MG tablet Take 162 mg by mouth every evening.    Yes Historical Provider, MD  buPROPion (WELLBUTRIN XL) 150 MG 24 hr tablet Take 150 mg by mouth at bedtime.    Yes Historical Provider, MD  cetirizine (ZYRTEC) 10 MG tablet Take 10 mg by mouth at bedtime.    Yes Historical Provider, MD  gabapentin (NEURONTIN) 400 MG capsule Take 400-1,200 mg by mouth 3 (three) times daily. Take 1 Tablet by mouth in the am, Take 1 Tablet by mouth in the afternoon and Take 3 Tablets by mouth at bedtime.   Yes Historical Provider, MD  glimepiride (AMARYL) 4 MG tablet Take 8 mg by mouth daily. 12/21/14  Yes Historical Provider, MD  ibuprofen (ADVIL,MOTRIN) 200 MG tablet Take 400 mg by mouth every 6 (six) hours as needed for moderate pain (pain).    Yes Historical Provider, MD  ibuprofen  (ADVIL,MOTRIN) 800 MG tablet Take 800 mg by mouth every 8 (eight) hours as needed for moderate pain.   Yes Historical Provider, MD  lamoTRIgine (LAMICTAL) 200 MG tablet Take 200 mg by mouth every evening.  02/24/15  Yes Historical Provider, MD  lisinopril (PRINIVIL,ZESTRIL) 5 MG tablet Take 5 mg by mouth daily.   Yes Historical Provider, MD  omeprazole (PRILOSEC) 40 MG capsule Take 40 mg by mouth at bedtime.    Yes Historical Provider, MD  risperiDONE (RISPERDAL) 1 MG tablet Take 1-2 mg by mouth as directed. Take 1 tablet (1 mg) in the morning and Take 2 tablets (2 mg) at bedtime. 12/30/14  Yes Historical Provider, MD  simvastatin (ZOCOR) 40 MG tablet Take 40 mg by mouth every evening.   Yes Historical Provider, MD  sitaGLIPtin-metformin (JANUMET) 50-1000 MG tablet Take 1 tablet by mouth 2 (two) times daily with a meal.   Yes Historical Provider, MD  carvedilol (COREG) 3.125 MG tablet Take 1 tablet (3.125 mg total) by mouth 2 (two) times daily with a meal. Patient not taking: Reported on 01/09/2015 07/20/14   Oswald Hillock, MD   BP 133/70 mmHg  Pulse 98  Temp(Src) 97.8 F (36.6 C) (Oral)  Resp 16  SpO2 96% Physical Exam  Constitutional: She appears well-developed and well-nourished. No distress.  HENT:  Head: Normocephalic and atraumatic.  Right Ear: External ear normal.  Left Ear: External ear normal.  Eyes: Conjunctivae are normal. Right eye exhibits no discharge. Left eye exhibits no discharge. No scleral icterus.  Neck: Neck supple. No tracheal deviation present.  Cardiovascular: Normal rate, regular rhythm and intact distal pulses.   Pulmonary/Chest: Effort normal and breath sounds normal. No stridor. No respiratory distress. She has no wheezes. She has no rales.  Abdominal: Soft. Bowel sounds are normal. She exhibits no distension. There is tenderness. There is no rebound and no guarding.  Protuberant abdomen, tenderness palpation left upper quadrant and left periumbilical region, no  guarding or peritoneal signs  Musculoskeletal: She exhibits no edema or tenderness.  Neurological: She is alert. She has normal strength. No cranial nerve deficit (no facial droop, extraocular movements intact, no slurred speech) or sensory deficit. She exhibits normal muscle tone. She displays no seizure activity. Coordination normal.  Skin: Skin is warm and dry. No rash noted.  Psychiatric: She has a normal mood and affect.  Nursing note and vitals reviewed.   ED Course  Procedures (including critical care time) Labs Review Labs Reviewed  CBC WITH DIFFERENTIAL/PLATELET - Abnormal; Notable for the following:    Platelets 119 (*)    All other  components within normal limits  COMPREHENSIVE METABOLIC PANEL - Abnormal; Notable for the following:    Glucose, Bld 140 (*)    Calcium 8.7 (*)    AST 46 (*)    ALT 57 (*)    All other components within normal limits  LIPASE, BLOOD - Abnormal; Notable for the following:    Lipase 61 (*)    All other components within normal limits  URINALYSIS, ROUTINE W REFLEX MICROSCOPIC (NOT AT [facility]) - Abnormal; Notable for the following:    Ketones, ur 15 (*)    All other components within normal limits     MDM   Patient has paraumbilical and left upper quadrant abdominal pain. She has not noticed any change in her diet or bowel habits. Denies any alcohol use but she does have an elevated lipase.  Plan on CT scan of abdomen and pelvis for further evaluation.  Dr Wilson Singer will follow up on the results.    Dorie Rank, MD 03/20/15 [phone]

## 2015-03-20 NOTE — ED Notes (Addendum)
Abdominal Pain  Pain began 1 day ago Medications tried: 800 mg Ibuprofen yesterday with no relief Similar pain before:No Prior abdominal surgeries: Complete hysterectomy  Symptoms Nausea/vomiting: No Diarrhea: No Back or blank pain:  No Constipation: No Blood in stool: No Blood in vomit: N/A Fever: No Dysuria: No Loss of appetite: No Weight loss: No  Vaginal Discharge/Bleeding: No  On exam, patient's lung sounds are clear to auscultation bilaterally with no wheezing or crackles.  Heart sounds WNL, S1/S2, no rub, gallop or murmur.  +2 radial and pedal pulses with no LE edema noted.  Patient's abdomen is soft and non-tender to palpation RLQ, but mildly tender with palpation LLQ.  Bowel sounds present.

## 2015-04-16 ENCOUNTER — Encounter: Payer: Medicaid Other | Attending: Family Medicine | Admitting: *Deleted

## 2015-04-16 ENCOUNTER — Encounter: Payer: Self-pay | Admitting: *Deleted

## 2015-04-16 VITALS — Ht 63.0 in | Wt 245.1 lb

## 2015-04-16 DIAGNOSIS — E119 Type 2 diabetes mellitus without complications: Secondary | ICD-10-CM | POA: Diagnosis not present

## 2015-04-16 DIAGNOSIS — Z713 Dietary counseling and surveillance: Secondary | ICD-10-CM | POA: Insufficient documentation

## 2015-04-16 NOTE — Progress Notes (Deleted)
Weighed patient on Tanita Scale this visit to assess % body fat prior to starting exercise:  TANITA  BODY COMP RESULTS: 01/12/15  Weight (lbs) 245.0   BMI (kg/m^2) 43.4   Fat Mass (lbs) 127.0   Fat Free Mass (lbs) 118.0   Total Body Water (lbs) 86.5    

## 2015-04-16 NOTE — Patient Instructions (Signed)
Plan:  Aim for 2- 3 Carb Choices per meal (30-45 grams) +/- 1 either way  Aim for 0-1 Carbs per snack if hungry  Include protein in moderation with your meals and snacks Continue reading food labels for Total Carbohydrate of foods Continue checking BG at alternate times per day if OK with MD  Consider checking out some places that have a pool and consider pool walking as well as taking the stairs to increase your activity level as tolerated Consider checking your BG after these activities so you can see the benefit! Continue taking medication as directed by MD Great job!    

## 2015-04-27 NOTE — Progress Notes (Deleted)
Diabetes Self-Management Education  Visit Type:  Follow-up  Appt. Start Time: 0930 Appt. End Time: 1000  04/27/2015  [name], identified by name and date of birth, is a 44 y.o. female with a diagnosis of Diabetes:  .   ASSESSMENT  Height 5\' 3"  (1.6 m), weight 245 lb 1.6 oz (111.177 kg). Body mass index is 43.43 kg/(m^2).       Diabetes Self-Management Education - 04/27/15 1920    Outcomes   Program Status Completed      Learning Objective:  Patient will have a greater understanding of diabetes self-management. Patient education plan is to attend individual and/or group sessions per assessed needs and concerns.   Plan:   Patient Instructions  Plan:  Aim for 2- 3 Carb Choices per meal (30-45 grams) +/- 1 either way  Aim for 0-1 Carbs per snack if hungry  Include protein in moderation with your meals and snacks Continue reading food labels for Total Carbohydrate of foods Continue checking BG at alternate times per day if OK with MD  Consider checking out some places that have a pool and consider pool walking as well as taking the stairs to increase your activity level as tolerated Consider checking your BG after these activities so you can see the benefit! Continue taking medication as directed by MD Doristine Devoid job!       Expected Outcomes:     Education material provided: Support group flyer  If problems or questions, patient to contact team via:  Phone and Email  Future DSME appointment: - PRN

## 2015-04-27 NOTE — Progress Notes (Deleted)
Diabetes Self-Management Education  Visit Type:  Follow-up  Appt. Start Time: *** Appt. End Time: ***  04/27/2015  [name], identified by name and date of birth, is a 44 y.o. female with a diagnosis of Diabetes:  .   ASSESSMENT  Height 5\' 3"  (1.6 m), weight 245 lb 1.6 oz (111.177 kg). Body mass index is 43.43 kg/(m^2).       Diabetes Self-Management Education - 04/27/15 1920    Outcomes   Program Status Completed      Learning Objective:  Patient will have a greater understanding of diabetes self-management. Patient education plan is to attend individual and/or group sessions per assessed needs and concerns.   Plan:   Patient Instructions  Plan:  Aim for 2- 3 Carb Choices per meal (30-45 grams) +/- 1 either way  Aim for 0-1 Carbs per snack if hungry  Include protein in moderation with your meals and snacks Continue reading food labels for Total Carbohydrate of foods Continue checking BG at alternate times per day if OK with MD  Consider checking out some places that have a pool and consider pool walking as well as taking the stairs to increase your activity level as tolerated Consider checking your BG after these activities so you can see the benefit! Continue taking medication as directed by MD Doristine Devoid job!       Expected Outcomes:     Education material provided: {CHL AMB DSME EDUCATION MATERIAL:22611}  If problems or questions, patient to contact team via:  {TYPE OF CONTACT:20355}  Future DSME appointment: - PRN

## 2015-04-28 NOTE — Progress Notes (Signed)
Diabetes Self-Management Education  Visit Type:  Follow-up  Appt. Start Time: 0930 Appt. End Time: 1000  04/28/2015  [name], identified by name and date of birth, is a 44 y.o. female with a diagnosis of Diabetes:  .   ASSESSMENT  Height 5\' 3"  (1.6 m), weight 245 lb 1.6 oz (111.177 kg). Body mass index is 43.43 kg/(m^2).       Diabetes Self-Management Education - 04/27/15 1920    Psychosocial Assessment   Patient Belief/Attitude about Diabetes Motivated to manage diabetes   Self-care barriers None   Patient Concerns Nutrition/Meal planning;Glycemic Control;Weight Control   Complications   Have you had a dilated eye exam in the past 12 months? Yes   Have you had a dental exam in the past 12 months? Yes   Exercise   Exercise Type Light (walking / raking leaves)   How many days per week to you exercise? 2.5   How many minutes per day do you exercise? 20   Total minutes per week of exercise 50   Patient Education   Previous Diabetes Education Yes (please comment)   Physical activity and exercise  Helped patient identify appropriate exercises in relation to his/her diabetes, diabetes complications and other health issue.   Medications Reviewed patients medication for diabetes, action, purpose, timing of dose and side effects.   Monitoring Purpose and frequency of SMBG.   Psychosocial adjustment Helped patient identify a support system for diabetes management   Individualized Goals (developed by patient)   Nutrition Follow meal plan discussed   Physical Activity Exercise 3-5 times per week  consider Arm Chair Exercises   Medications take my medication as prescribed   Monitoring  test blood glucose pre and post meals as discussed   Reducing Risk examine blood glucose patterns   Patient Self-Evaluation of Goals - Patient rates self as meeting previously set goals (% of time)   Nutrition >75%   Physical Activity 50 - 75 %   Medications >75%   Monitoring >75%   Health Coping >75%   Outcomes   Program Status Completed   Subsequent Visit   Since your last visit have you continued or begun to take your medications as prescribed? Yes   Since your last visit have you experienced any weight changes? Loss      Learning Objective:  Patient will have a greater understanding of diabetes self-management. Patient education plan is to attend individual and/or group sessions per assessed needs and concerns.   Plan:   Patient Instructions  Plan:  Aim for 2- 3 Carb Choices per meal (30-45 grams) +/- 1 either way  Aim for 0-1 Carbs per snack if hungry  Include protein in moderation with your meals and snacks Continue reading food labels for Total Carbohydrate of foods Continue checking BG at alternate times per day if OK with MD  Consider checking out some places that have a pool and consider pool walking as well as taking the stairs to increase your activity level as tolerated Consider checking your BG after these activities so you can see the benefit! Continue taking medication as directed by MD Doristine Devoid job!       Expected Outcomes:  Demonstrated interest in learning. Expect positive outcomes  Education material provided: Support group flyer, Arm Chair exercises  If problems or questions, patient to contact team via:  Phone and Email  Future DSME appointment: - PRN

## 2015-06-02 IMAGING — US US RENAL
1 series · 14 of 25 positions shown · non-contrast
Comparison: CT abdomen and pelvis [DATE]

CLINICAL DATA: RIGHT flank pain, assess for obstructive uropathy.
History of kidney stones, uterine cancer.

EXAM:
RENAL / URINARY TRACT ULTRASOUND COMPLETE

[Series 1: us renal · 0.28mm/px · 14 of 34 slices shown]
[im 1/34]
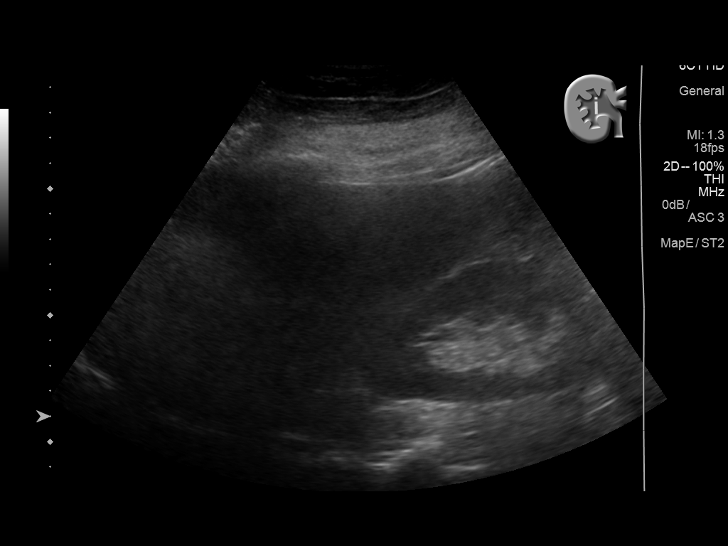
[im 3/34]
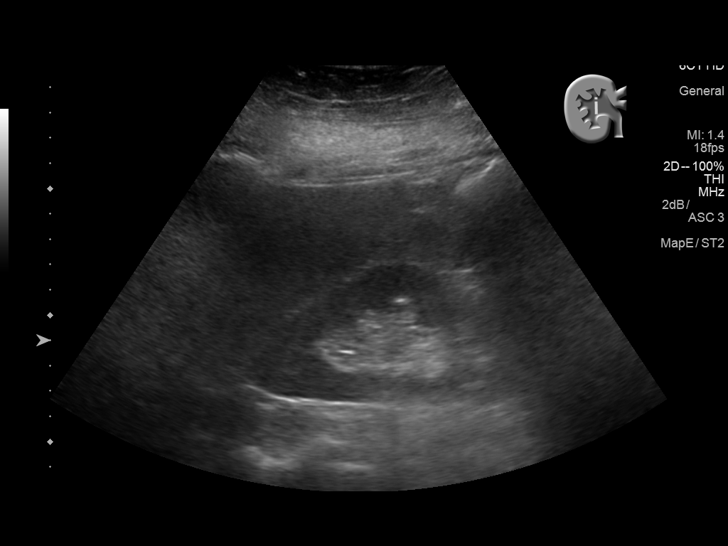
[im 6/34]
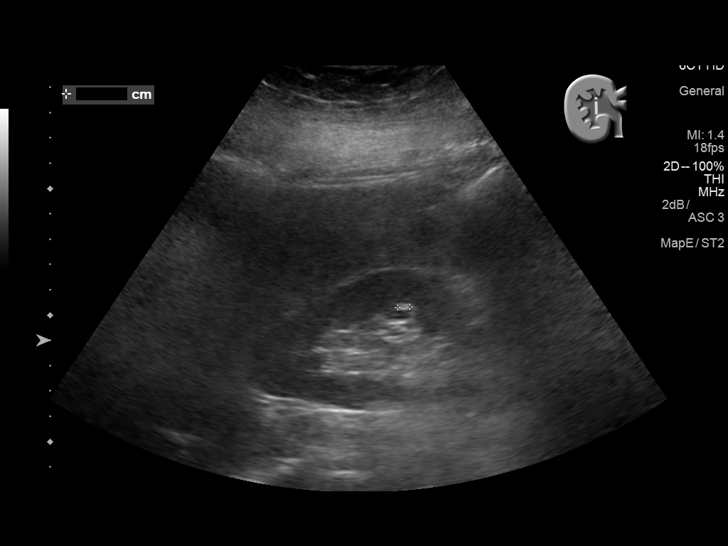
[im 9/34]
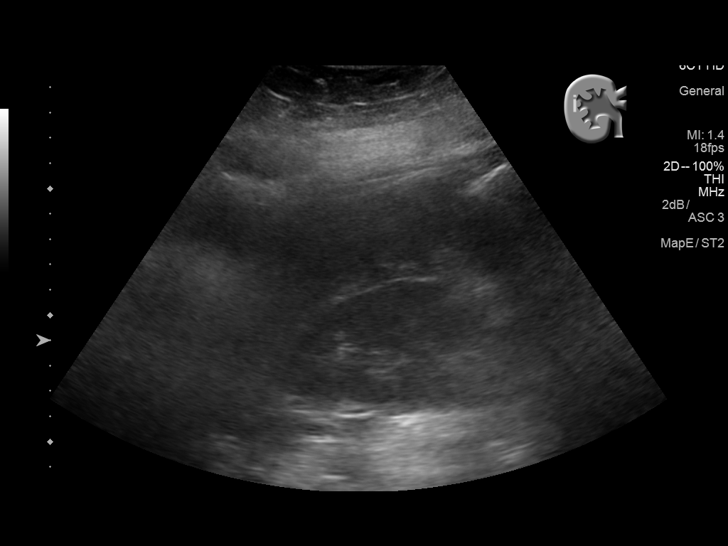
[im 12/34]
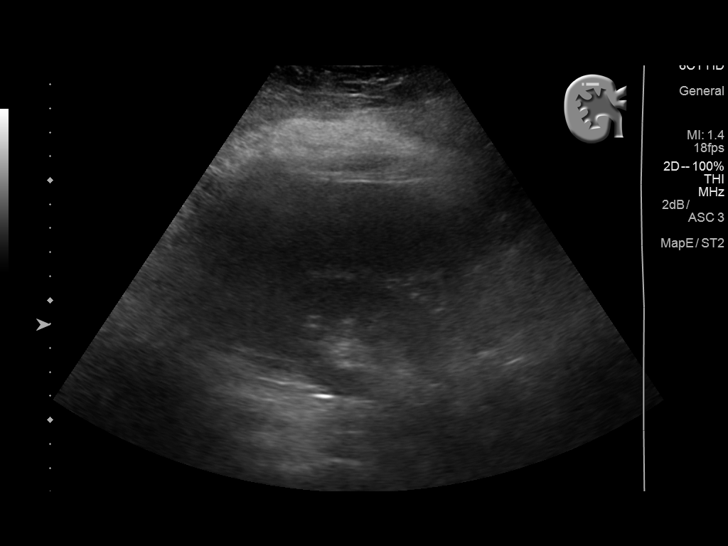
[im 13/34]
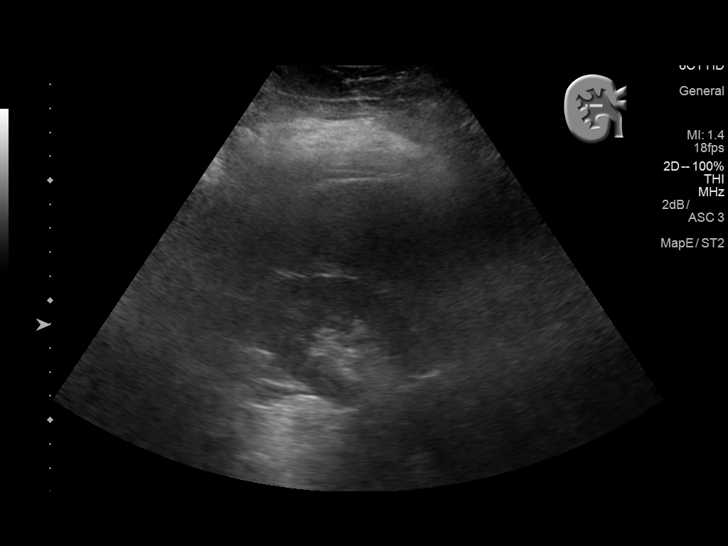
[im 16/34]
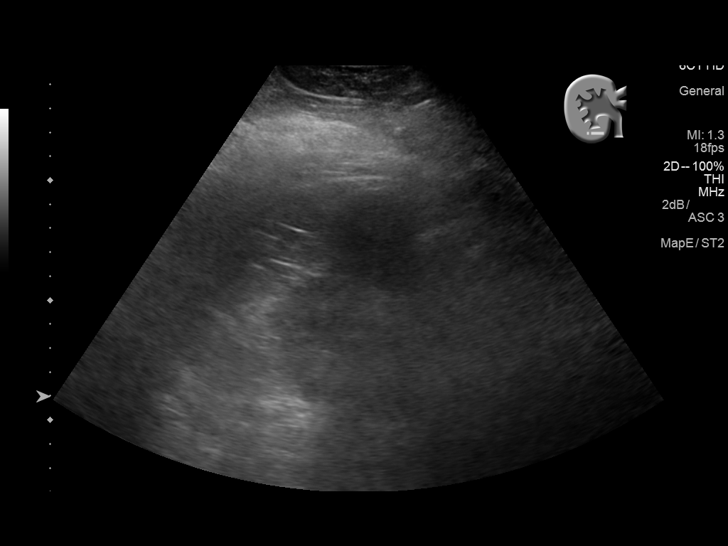
[im 18/34]
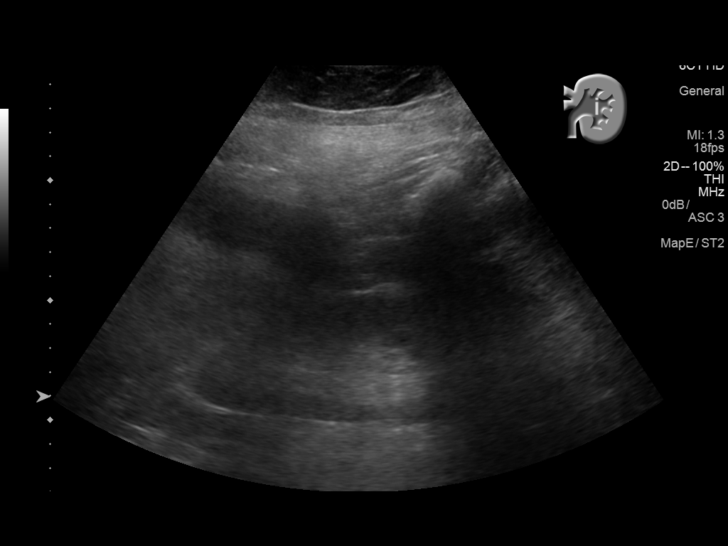
[im 21/34]
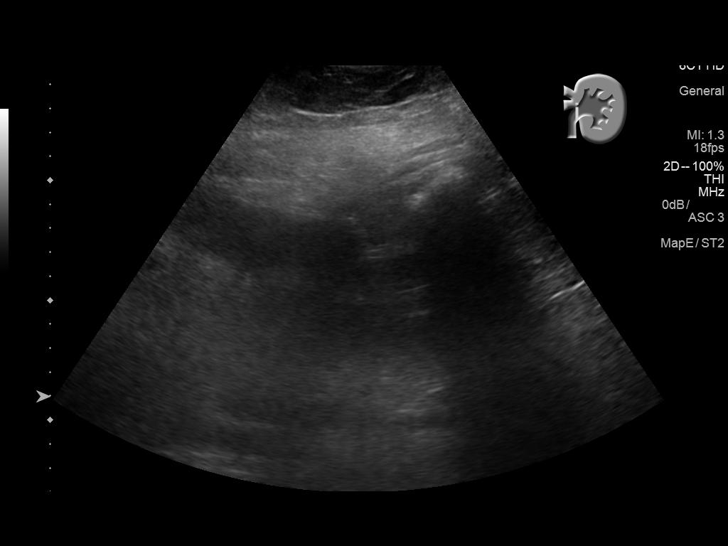
[im 23/34]
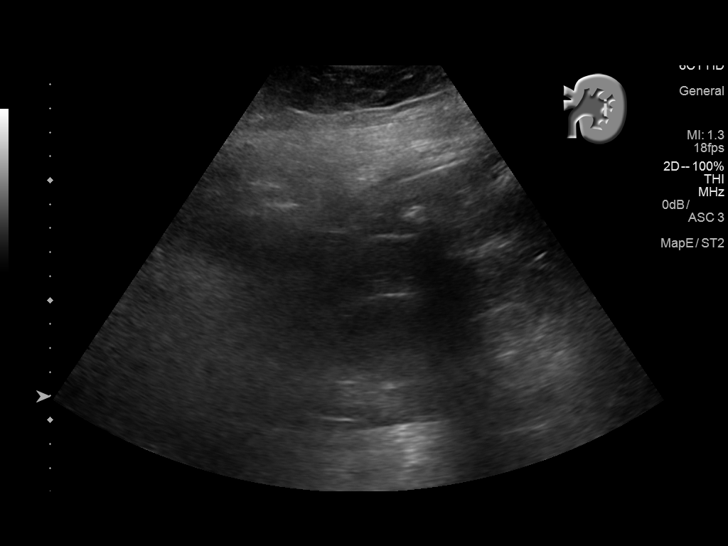
[im 25/34]
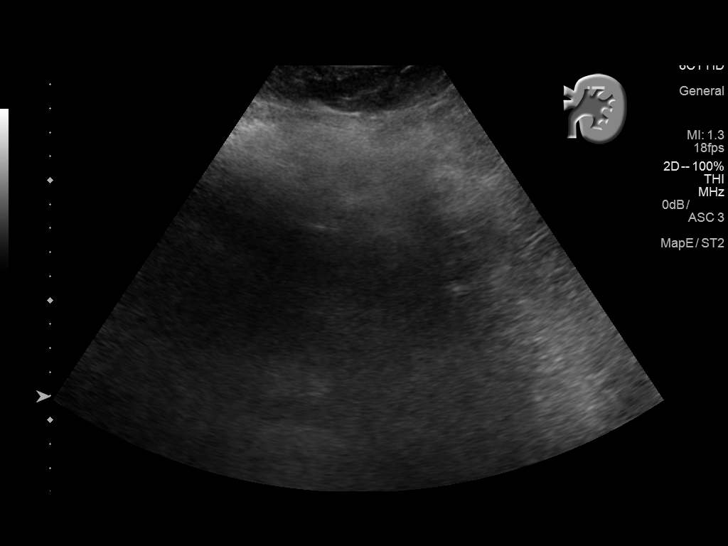
[im 28/34]
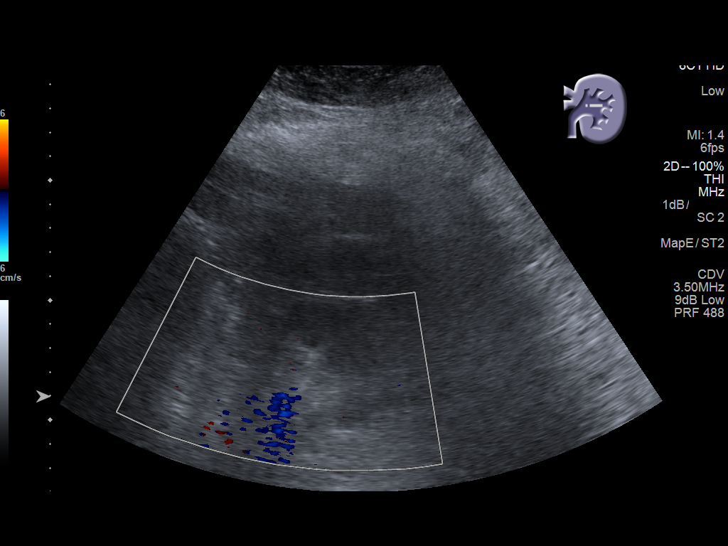
[im 31/34]
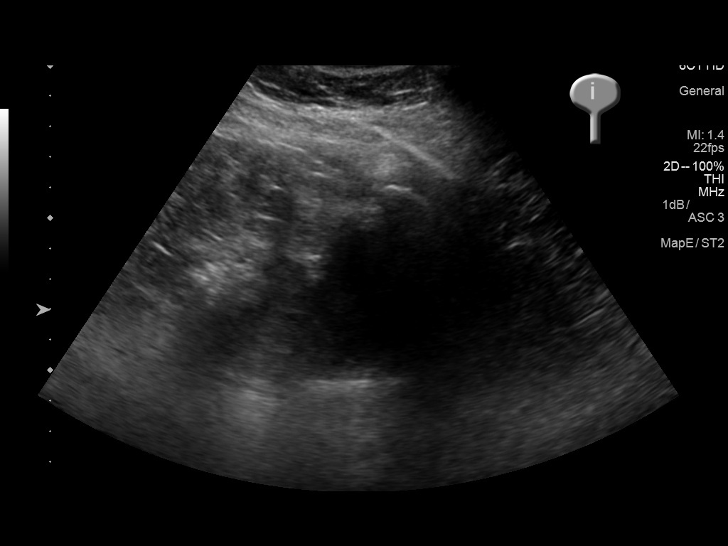
[im 34/34]
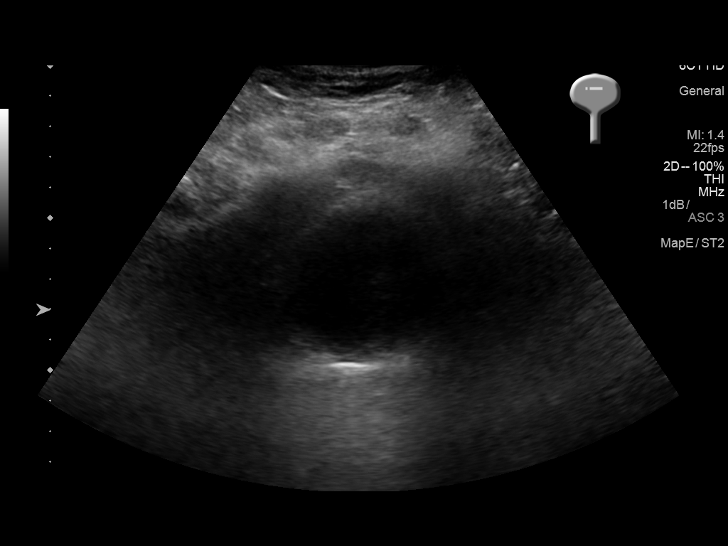

[14 of 25 positions shown; findings below may reference images not displayed]

FINDINGS: Right Kidney:

Length: 10.7 cm. Echogenicity within normal limits. No mass or
hydronephrosis visualized. 5 mm echogenic interpolar focus most
compatible with nonshadowing calculus.

Left Kidney:

Length: 11.9 cm. Echogenicity within normal limits. No mass or
hydronephrosis visualized.

Bladder:

Appears normal for degree of bladder distention.
IMPRESSION: 5 mm suspected nonobstructing RIGHT nephrolithiasis.

## 2015-06-10 ENCOUNTER — Emergency Department (HOSPITAL_COMMUNITY): Payer: Medicaid Other

## 2015-06-10 ENCOUNTER — Emergency Department (HOSPITAL_COMMUNITY)
Admission: EM | Admit: 2015-06-10 | Discharge: 2015-06-10 | Disposition: A | Discharge status: Home / Self Care | Payer: Medicaid Other | Attending: Emergency Medicine | Admitting: Emergency Medicine

## 2015-06-10 ENCOUNTER — Encounter (HOSPITAL_COMMUNITY): Payer: Self-pay

## 2015-06-10 DIAGNOSIS — Z87448 Personal history of other diseases of urinary system: Secondary | ICD-10-CM | POA: Diagnosis not present

## 2015-06-10 DIAGNOSIS — Z90710 Acquired absence of both cervix and uterus: Secondary | ICD-10-CM | POA: Insufficient documentation

## 2015-06-10 DIAGNOSIS — Z7982 Long term (current) use of aspirin: Secondary | ICD-10-CM | POA: Diagnosis not present

## 2015-06-10 DIAGNOSIS — E119 Type 2 diabetes mellitus without complications: Secondary | ICD-10-CM | POA: Insufficient documentation

## 2015-06-10 DIAGNOSIS — Z79899 Other long term (current) drug therapy: Secondary | ICD-10-CM | POA: Insufficient documentation

## 2015-06-10 DIAGNOSIS — Z862 Personal history of diseases of the blood and blood-forming organs and certain disorders involving the immune mechanism: Secondary | ICD-10-CM | POA: Insufficient documentation

## 2015-06-10 DIAGNOSIS — E049 Nontoxic goiter, unspecified: Secondary | ICD-10-CM | POA: Diagnosis not present

## 2015-06-10 DIAGNOSIS — Z87440 Personal history of urinary (tract) infections: Secondary | ICD-10-CM | POA: Insufficient documentation

## 2015-06-10 DIAGNOSIS — R740 Nonspecific elevation of levels of transaminase and lactic acid dehydrogenase [LDH]: Secondary | ICD-10-CM | POA: Insufficient documentation

## 2015-06-10 DIAGNOSIS — F329 Major depressive disorder, single episode, unspecified: Secondary | ICD-10-CM | POA: Diagnosis not present

## 2015-06-10 DIAGNOSIS — Z87442 Personal history of urinary calculi: Secondary | ICD-10-CM | POA: Diagnosis not present

## 2015-06-10 DIAGNOSIS — K219 Gastro-esophageal reflux disease without esophagitis: Secondary | ICD-10-CM | POA: Diagnosis not present

## 2015-06-10 DIAGNOSIS — E785 Hyperlipidemia, unspecified: Secondary | ICD-10-CM | POA: Insufficient documentation

## 2015-06-10 DIAGNOSIS — E7800 Pure hypercholesterolemia, unspecified: Secondary | ICD-10-CM | POA: Insufficient documentation

## 2015-06-10 DIAGNOSIS — R1013 Epigastric pain: Secondary | ICD-10-CM | POA: Diagnosis present

## 2015-06-10 DIAGNOSIS — R7401 Elevation of levels of liver transaminase levels: Secondary | ICD-10-CM

## 2015-06-10 LAB — URINALYSIS, ROUTINE W REFLEX MICROSCOPIC
Bilirubin Urine: NEGATIVE
Glucose, UA: 250 mg/dL — AB
Hgb urine dipstick: NEGATIVE
Ketones, ur: 15 mg/dL — AB
Leukocytes, UA: NEGATIVE
Nitrite: NEGATIVE
Protein, ur: NEGATIVE mg/dL
Specific Gravity, Urine: 1.022 (ref 1.005–1.030)
pH: 6 (ref 5.0–8.0)

## 2015-06-10 LAB — COMPREHENSIVE METABOLIC PANEL
ALT: 126 U/L — ABNORMAL HIGH (ref 14–54)
AST: 170 U/L — ABNORMAL HIGH (ref 15–41)
Albumin: 4 g/dL (ref 3.5–5.0)
Alkaline Phosphatase: 150 U/L — ABNORMAL HIGH (ref 38–126)
Anion gap: 9 (ref 5–15)
BUN: 12 mg/dL (ref 6–20)
CO2: 28 mmol/L (ref 22–32)
Calcium: 8.9 mg/dL (ref 8.9–10.3)
Chloride: 103 mmol/L (ref 101–111)
Creatinine, Ser: 0.68 mg/dL (ref 0.44–1.00)
GFR calc Af Amer: >60 mL/min (ref >60)
GFR calc non Af Amer: >60 mL/min (ref >60)
Glucose, Bld: 226 mg/dL — ABNORMAL HIGH (ref 65–99)
Potassium: 4 mmol/L (ref 3.5–5.1)
Sodium: 140 mmol/L (ref 135–145)
Total Bilirubin: 1.5 mg/dL — ABNORMAL HIGH (ref 0.3–1.2)
Total Protein: 7 g/dL (ref 6.5–8.1)

## 2015-06-10 LAB — CBC
HCT: 42.7 % (ref 36.0–46.0)
Hemoglobin: 14.1 g/dL (ref 12.0–15.0)
MCH: 27.6 pg (ref 26.0–34.0)
MCHC: 33 g/dL (ref 30.0–36.0)
MCV: 83.6 fL (ref 78.0–100.0)
Platelets: 122 10*3/uL — ABNORMAL LOW (ref 150–400)
RBC: 5.11 MIL/uL (ref 3.87–5.11)
RDW: 13.3 % (ref 11.5–15.5)
WBC: 7.6 10*3/uL (ref 4.0–10.5)

## 2015-06-10 LAB — I-STAT TROPONIN, ED: Troponin i, poc: 0 ng/mL (ref 0.00–0.08)

## 2015-06-10 LAB — LIPASE, BLOOD: Lipase: 51 U/L (ref 11–51)

## 2015-06-10 IMAGING — CT CT ANGIO CHEST
2 of 7 series · 17 of 46 positions shown · IV contrast (omnipaque)
Comparison: Abdominal CT [DATE]

CLINICAL DATA: Epigastric pain radiating through the back.

EXAM:
CT ANGIOGRAPHY CHEST, ABDOMEN AND PELVIS
TECHNIQUE: Multidetector CT imaging through the chest, abdomen and pelvis was
performed using the standard protocol during bolus administration of
intravenous contrast. Multiplanar reconstructed images and MIPs were
obtained and reviewed to evaluate the vascular anatomy.
CONTRAST:  100mL OMNIPAQUE IOHEXOL 350 MG/ML SOLN

[Series 5: arterial 3.0 b30f · axial · arterial · 0.72mm/px · z∈[-282,+256]mm · 14 of 199 slices shown]
[im 10/199  lung]
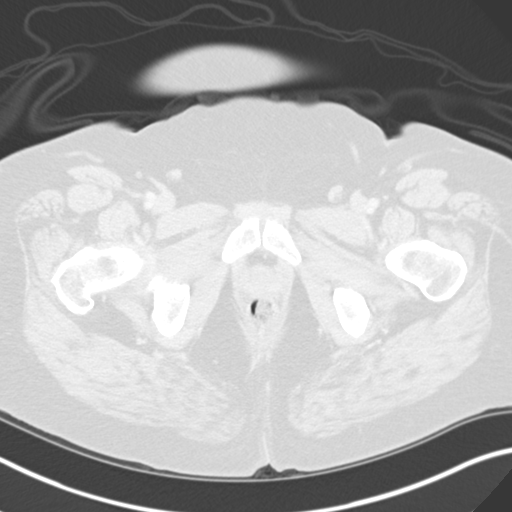
[im 30/199  soft-tissue]
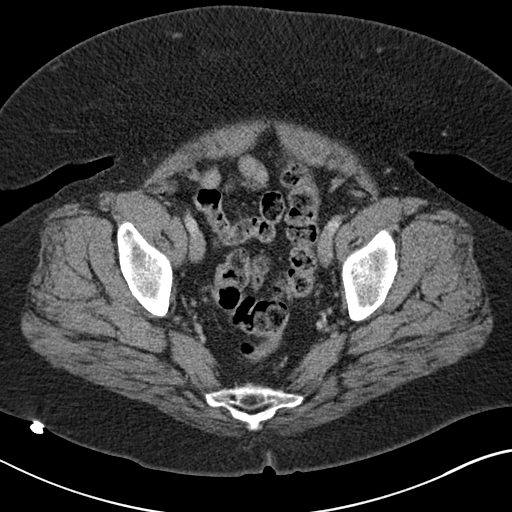
[im 40/199  lung]
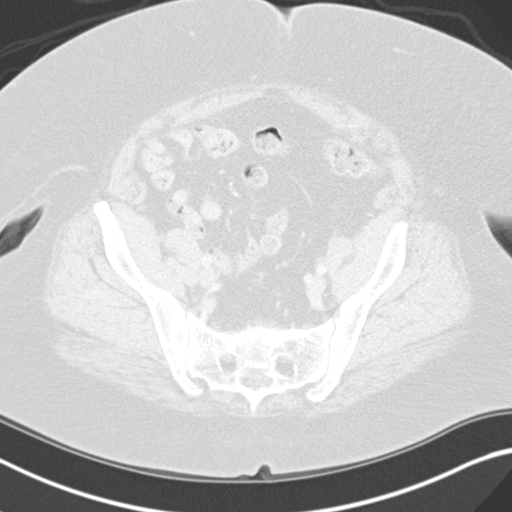
[im 50/199  soft-tissue]
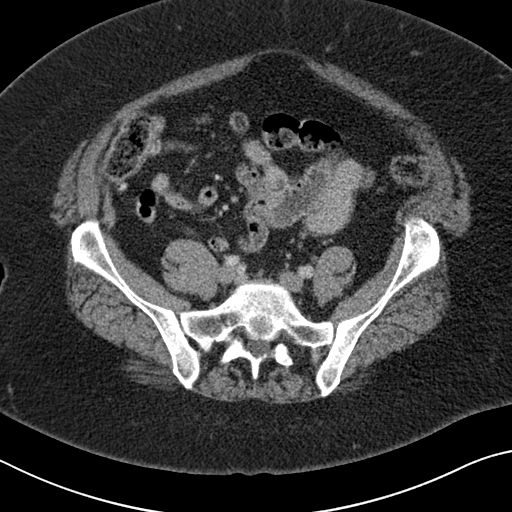
[im 70/199  lung]
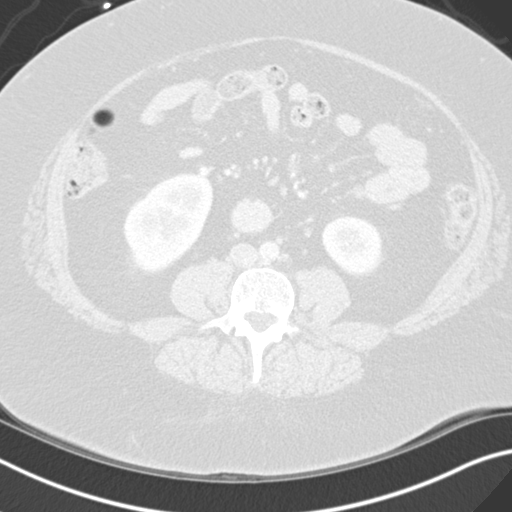
[im 80/199  soft-tissue]
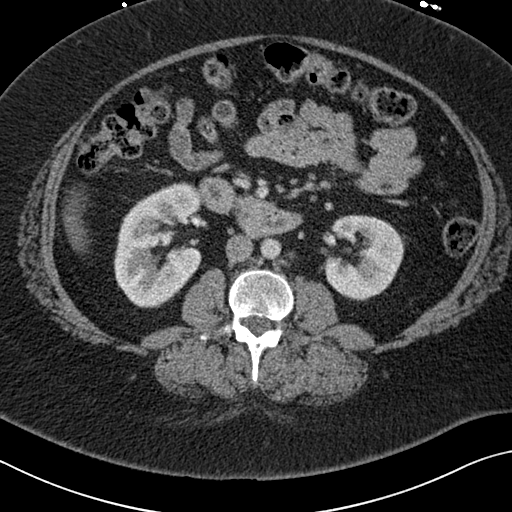
[im 90/199  lung]
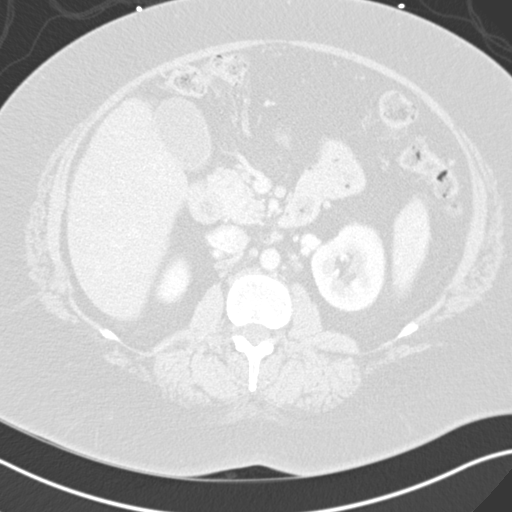
[im 109/199  soft-tissue]
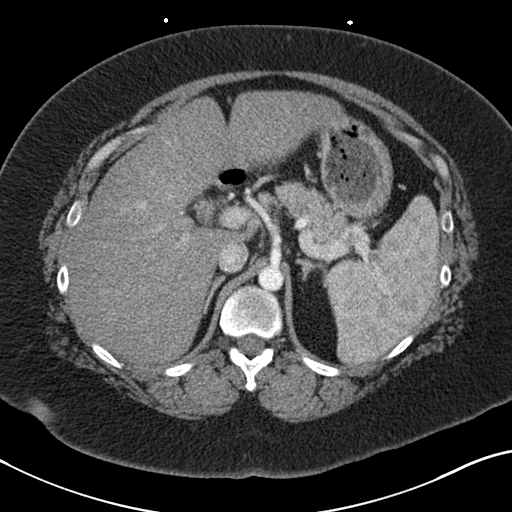
[im 119/199  lung]
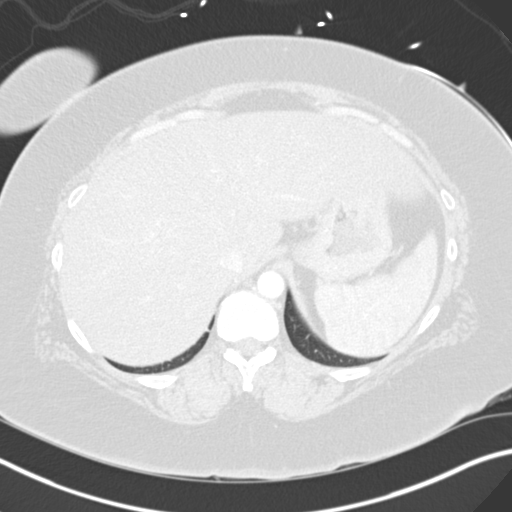
[im 129/199  soft-tissue]
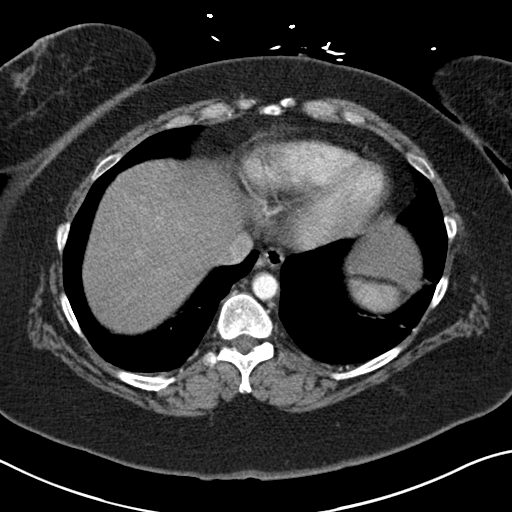
[im 149/199  lung]
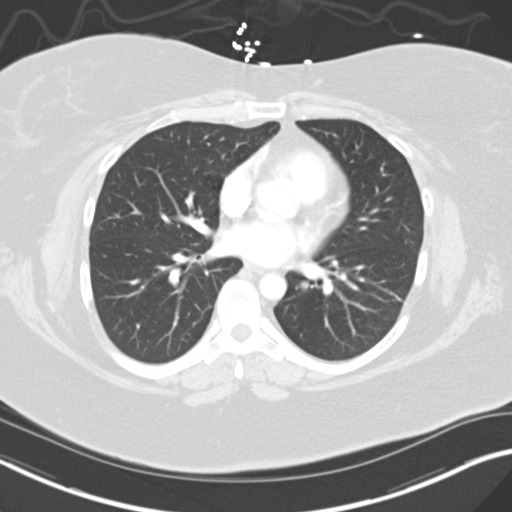
[im 159/199  soft-tissue]
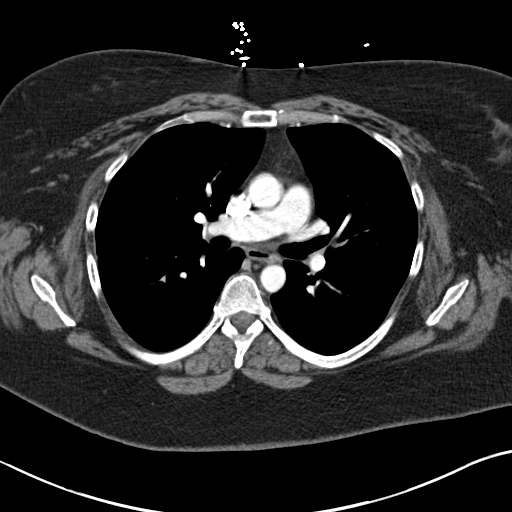
[im 169/199  lung]
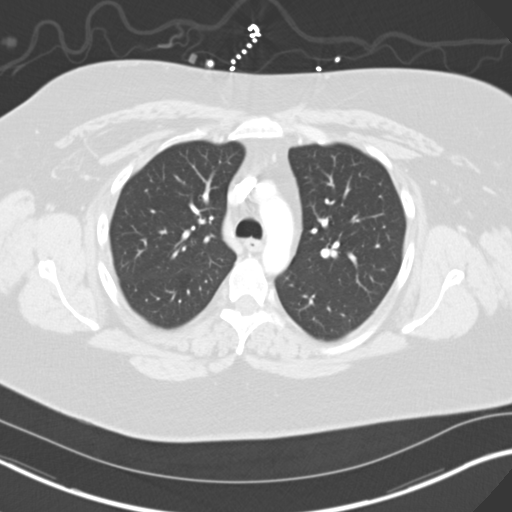
[im 189/199  soft-tissue]
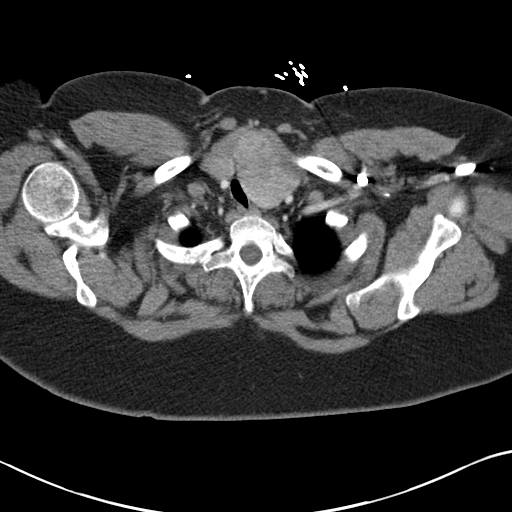

[Series 7: coronal mpr · coronal · 0.72mm/px · 3 of 162 slices shown]
[im 41/162  soft-tissue]
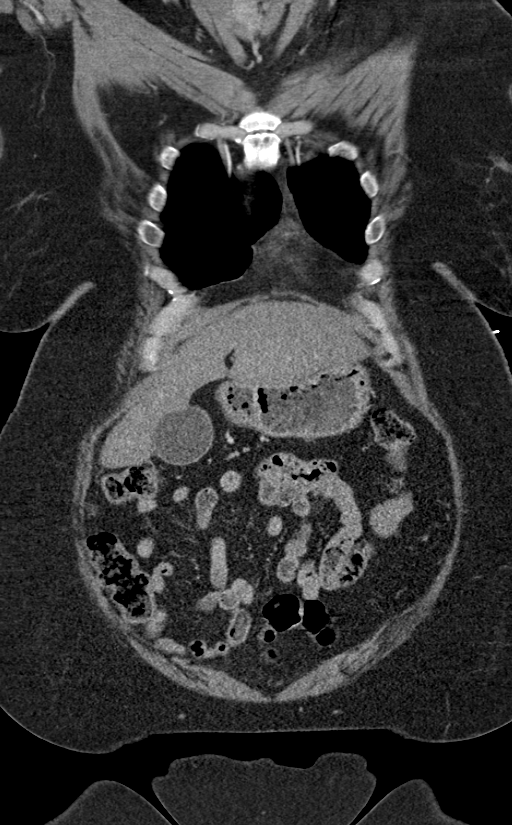
[im 81/162  soft-tissue]
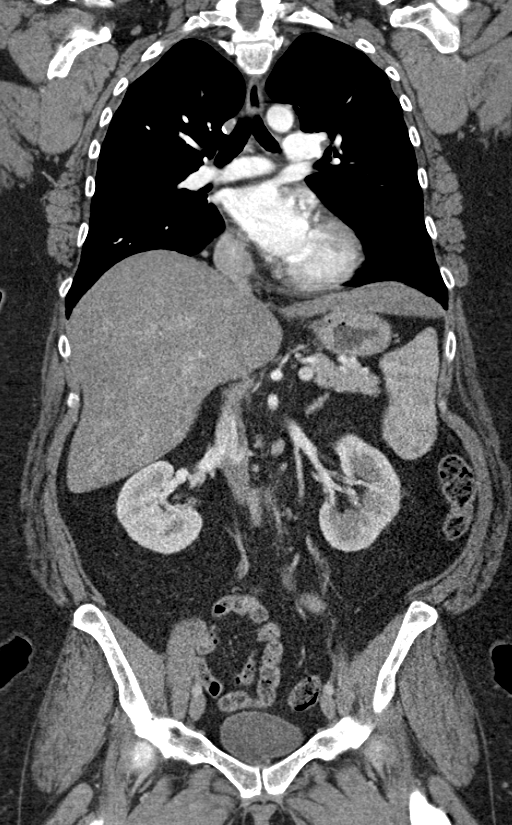
[im 121/162  soft-tissue]
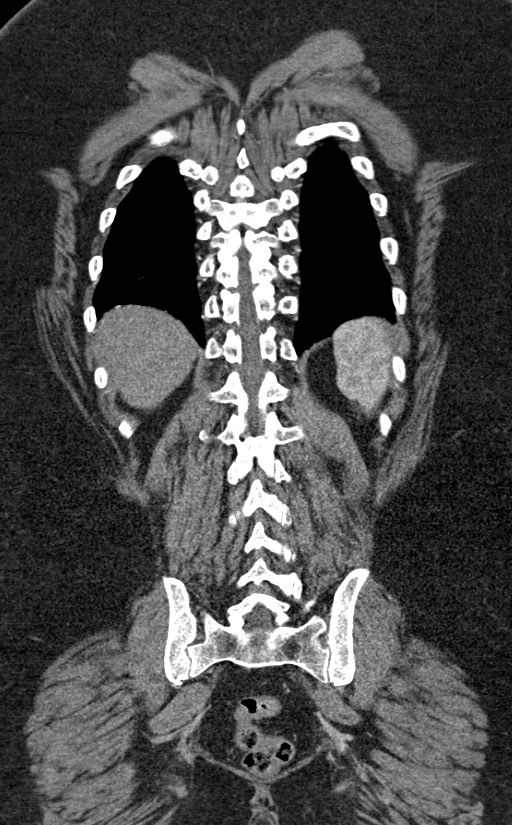

[17 of 46 positions shown; findings below may reference images not displayed]

FINDINGS: CTA CHEST FINDINGS

THORACIC INLET/BODY WALL:

Goiter with moderate transverse tracheal narrowing. Minimal
transverse dimension is 11 mm.

MEDIASTINUM:

Normal heart size. No pericardial effusion. No aortic intramural
hematoma or dissection.

No adenopathy.

LUNG WINDOWS:

No consolidation.  No effusion.

Subsegmental atelectasis at the bases.

Calcified pulmonary nodule in the right lower lobe.

OSSEOUS:

No acute fracture.  No suspicious lytic or blastic lesions.

Review of the MIP images confirms the above findings.

CTA ABDOMEN AND PELVIS FINDINGS

Abdominal wall:  Fatty umbilical hernia.

Hepatobiliary: Hepatic steatosis, mild based on pre-contrast.No
evidence of biliary obstruction or stone.

Pancreas: Unremarkable.

Spleen: Unremarkable.

Adrenals/Urinary Tract: Negative adrenals. Punctate lower pole left
nephrolithiasis. No hydronephrosis or ureteral calculus.
Unremarkable bladder.

Reproductive:Hysterectomy and probable oophorectomies.

Stomach/Bowel:  No obstruction. No appendicitis.

Vascular/Lymphatic: Standard aortic branching without aneurysm,
dissection, or atheromatous changes. No stenosis. No mass or
adenopathy.

Peritoneal: No ascites or pneumoperitoneum.

Musculoskeletal: No acute abnormalities.

Review of the MIP images confirms the above findings.
IMPRESSION: 1. Normal CTA of the aorta.  No acute finding.
2. Goiter with moderate transverse tracheal narrowing.
3. Small left renal stone.

## 2015-06-10 MED ORDER — IOHEXOL 350 MG/ML SOLN
100.0000 mL | Freq: Once | INTRAVENOUS | Status: AC | PRN
  Administered 2015-06-10: 100 mL via INTRAVENOUS

## 2015-06-10 MED ORDER — GI COCKTAIL ~~LOC~~
30.0000 mL | Freq: Once | ORAL | Status: AC
  Administered 2015-06-10: 30 mL via ORAL
  Filled 2015-06-10: qty 30

## 2015-06-10 MED ORDER — PANTOPRAZOLE SODIUM 20 MG PO TBEC: 20.0000 mg | DELAYED_RELEASE_TABLET | Freq: Every day | ORAL | Status: DC

## 2015-06-10 MED ORDER — SODIUM CHLORIDE 0.9 % IV BOLUS (SEPSIS)
1000.0000 mL | Freq: Once | INTRAVENOUS | Status: AC
  Administered 2015-06-10: 1000 mL via INTRAVENOUS

## 2015-06-10 NOTE — ED Provider Notes (Signed)
CSN: [id number]     Arrival date & time 06/10/15  [id number] History   First MD Initiated Contact with Patient 06/10/15 [phone]     Chief Complaint  Patient presents with  . Abdominal Pain     (Consider location/radiation/quality/duration/timing/severity/associated sxs/prior Treatment) Patient is a 44 y.o. female presenting with abdominal pain. The history is provided by the patient.  Abdominal Pain Pain location:  Epigastric Pain quality: sharp and stabbing   Pain radiates to:  Back Pain severity:  Severe Onset quality:  Sudden Timing:  Constant Progression:  Improving Chronicity:  New Context: eating   Context: not alcohol use and not trauma   Context comment:  [employer]f [employer] Relieved by:  Nothing Worsened by:  Nothing tried Ineffective treatments:  None tried Associated symptoms: nausea and vomiting   Associated symptoms: no anorexia, no constipation, no cough, no diarrhea, no dysuria, no fever, no flatus and no melena   Risk factors: has not had multiple surgeries     Past Medical History  Diagnosis Date  . Allergic rhinitis   . Hyperlipidemia   . Diabetes mellitus   . Personality disorder   . Overactive bladder   . UTI (urinary tract infection)   . Depression   . GERD (gastroesophageal reflux disease)   . Non-insulin dependent type 2 diabetes mellitus ([place])   . Anemia   . Elevated cholesterol   . Right lower quadrant pain   . Polyphagia   . Uterine cancer ([place])   . 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 Kidney stone 6/13    H/O  . 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 Inner ear surgery      L ear d/t tumor and R ear d/t hole in ear drum  . Tonsillectomy      age 66  . Total abdominal hysterectomy  feb 2011    BSO, node dissection  . Endometrial biopsy     Family History  Problem Relation Age of Onset  . Asthma Mother   . Heart disease Mother   . Diabetes Mother   .  Emphysema Mother   . Hypertension Mother   . Stroke Mother   . Prostate cancer Father   . Heart disease Sister   . Heart attack Brother   . Heart disease Brother   . Heart disease Sister   . Diabetes Sister    Social History  Substance Use Topics  . Smoking status: Never Smoker   . Smokeless tobacco: Never Used  . Alcohol Use: Yes   OB History    Gravida Para Term Preterm AB TAB SAB Ectopic Multiple Living   0              Review of Systems  Constitutional: Negative for fever.  Respiratory: Negative for cough.   Cardiovascular: Negative for palpitations and leg swelling.  Gastrointestinal: Positive for nausea, vomiting and abdominal pain. Negative for diarrhea, constipation, melena, anorexia and flatus.  Genitourinary: Negative for dysuria and flank pain.  All other systems reviewed and are negative.     Allergies  Hydrocodone-acetaminophen and Codeine  Home Medications   Prior to Admission medications   Medication Sig Start Date End Date Taking? Authorizing Provider  aspirin 81 MG tablet Take 162 mg by mouth every evening.    Yes Historical Provider, MD  buPROPion (WELLBUTRIN XL) 150 MG 24 hr tablet Take 150 mg by mouth at bedtime.  Yes Historical Provider, MD  cetirizine (ZYRTEC) 10 MG tablet Take 10 mg by mouth at bedtime.    Yes Historical Provider, MD  gabapentin (NEURONTIN) 400 MG capsule Take 400-1,200 mg by mouth 3 (three) times daily. Take 1 Tablet by mouth in the am, Take 1 Tablet by mouth in the afternoon and Take 3 Tablets by mouth at bedtime.   Yes Historical Provider, MD  glimepiride (AMARYL) 4 MG tablet Take 8 mg by mouth daily. 12/21/14  Yes Historical Provider, MD  ibuprofen (ADVIL,MOTRIN) 200 MG tablet Take 400 mg by mouth every 6 (six) hours as needed for moderate pain (pain).    Yes Historical Provider, MD  ibuprofen (ADVIL,MOTRIN) 800 MG tablet Take 800 mg by mouth every 8 (eight) hours as needed for moderate pain.   Yes Historical Provider, MD   lamoTRIgine (LAMICTAL) 200 MG tablet Take 200 mg by mouth every evening.  02/24/15  Yes Historical Provider, MD  lisinopril (PRINIVIL,ZESTRIL) 5 MG tablet Take 5 mg by mouth daily.   Yes Historical Provider, MD  omeprazole (PRILOSEC) 40 MG capsule Take 40 mg by mouth at bedtime.    Yes Historical Provider, MD  risperiDONE (RISPERDAL) 1 MG tablet Take 1-2 mg by mouth as directed. Take 1 tablet (1 mg) in the morning and Take 2 tablets (2 mg) at bedtime. 12/30/14  Yes Historical Provider, MD  simvastatin (ZOCOR) 40 MG tablet Take 40 mg by mouth every evening.   Yes Historical Provider, MD  sitaGLIPtin-metformin (JANUMET) 50-1000 MG tablet Take 1 tablet by mouth 2 (two) times daily with a meal.   Yes Historical Provider, MD  carvedilol (COREG) 3.125 MG tablet Take 1 tablet (3.125 mg total) by mouth 2 (two) times daily with a meal. Patient not taking: Reported on 01/09/2015 07/20/14   Oswald Hillock, MD   BP 153/89 mmHg  Pulse 95  Temp(Src) 98.2 F (36.8 C) (Oral)  Resp 18  Ht 5\' 3"  (1.6 m)  Wt 246 lb (111.585 kg)  BMI 43.59 kg/m2  SpO2 95% Physical Exam  Constitutional: She is oriented to person, place, and time. She appears well-developed and well-nourished. No distress.  HENT:  Head: Normocephalic and atraumatic.  Mouth/Throat: Oropharynx is clear and moist.  Eyes: Conjunctivae are normal. Pupils are equal, round, and reactive to light.  Neck: Normal range of motion. Neck supple.  Cardiovascular: Normal rate, regular rhythm and intact distal pulses.   Pulmonary/Chest: Effort normal and breath sounds normal. No respiratory distress. She has no wheezes. She has no rales.  Abdominal: Soft. There is no tenderness. There is no rebound, no guarding, no tenderness at McBurney's point and negative Murphy's sign.  Musculoskeletal: Normal range of motion. She exhibits no edema or tenderness.  Neurological: She is alert and oriented to person, place, and time. She has normal reflexes.  Skin: Skin is warm  and dry.  Psychiatric: She has a normal mood and affect.    ED Course  Procedures (including critical care time) Labs Review Labs Reviewed  COMPREHENSIVE METABOLIC PANEL - Abnormal; Notable for the following:    Glucose, Bld 226 (*)    AST 170 (*)    ALT 126 (*)    Alkaline Phosphatase 150 (*)    Total Bilirubin 1.5 (*)    All other components within normal limits  CBC - Abnormal; Notable for the following:    Platelets 122 (*)    All other components within normal limits  URINALYSIS, ROUTINE W REFLEX MICROSCOPIC (NOT AT [facility]) -  Abnormal; Notable for the following:    Glucose, UA 250 (*)    Ketones, ur 15 (*)    All other components within normal limits  LIPASE, BLOOD  I-STAT TROPOININ, ED    Imaging Review No results found. I have personally reviewed and evaluated these images and lab results as part of my medical decision-making.   EKG Interpretation   Date/Time:  Thursday June 10 2015 04:45:39 EST Ventricular Rate:  102 PR Interval:  147 QRS Duration: 90 QT Interval:  351 QTC Calculation: 457 R Axis:   73 Text Interpretation:  Sinus tachycardia Low voltage, precordial leads  Confirmed by [facility]  MD, Evan Mackie ([id number]) on 06/10/2015 5:29:12 AM      MDM   Final diagnoses:  None   Results for orders placed or performed during the hospital encounter of 06/10/15  Lipase, blood  Result Value Ref Range   Lipase 51 11 - 51 U/L  Comprehensive metabolic panel  Result Value Ref Range   Sodium 140 135 - 145 mmol/L   Potassium 4.0 3.5 - 5.1 mmol/L   Chloride 103 101 - 111 mmol/L   CO2 28 22 - 32 mmol/L   Glucose, Bld 226 (H) 65 - 99 mg/dL   BUN 12 6 - 20 mg/dL   Creatinine, Ser 0.68 0.44 - 1.00 mg/dL   Calcium 8.9 8.9 - 10.3 mg/dL   Total Protein 7.0 6.5 - 8.1 g/dL   Albumin 4.0 3.5 - 5.0 g/dL   AST 170 (H) 15 - 41 U/L   ALT 126 (H) 14 - 54 U/L   Alkaline Phosphatase 150 (H) 38 - 126 U/L   Total Bilirubin 1.5 (H) 0.3 - 1.2 mg/dL   GFR calc non Af Amer  >60 >60 mL/min   GFR calc Af Amer >60 >60 mL/min   Anion gap 9 5 - 15  CBC  Result Value Ref Range   WBC 7.6 4.0 - 10.5 K/uL   RBC 5.11 3.87 - 5.11 MIL/uL   Hemoglobin 14.1 12.0 - 15.0 g/dL   HCT 42.7 36.0 - 46.0 %   MCV 83.6 78.0 - 100.0 fL   MCH 27.6 26.0 - 34.0 pg   MCHC 33.0 30.0 - 36.0 g/dL   RDW 13.3 11.5 - 15.5 %   Platelets 122 (L) 150 - 400 K/uL  Urinalysis, Routine w reflex microscopic (not at [facility])  Result Value Ref Range   Color, Urine YELLOW YELLOW   APPearance CLEAR CLEAR   Specific Gravity, Urine 1.022 1.005 - 1.030   pH 6.0 5.0 - 8.0   Glucose, UA 250 (A) NEGATIVE mg/dL   Hgb urine dipstick NEGATIVE NEGATIVE   Bilirubin Urine NEGATIVE NEGATIVE   Ketones, ur 15 (A) NEGATIVE mg/dL   Protein, ur NEGATIVE NEGATIVE mg/dL   Nitrite NEGATIVE NEGATIVE   Leukocytes, UA NEGATIVE NEGATIVE  I-Stat Troponin, ED (not at [facility])  Result Value Ref Range   Troponin i, poc 0.00 0.00 - 0.08 ng/mL   Comment 3           Ct Angio Chest Aorta W/cm &/or Wo/cm  06/10/2015  CLINICAL DATA:  Epigastric pain radiating through the back. EXAM: CT ANGIOGRAPHY CHEST, ABDOMEN AND PELVIS TECHNIQUE: Multidetector CT imaging through the chest, abdomen and pelvis was performed using the standard protocol during bolus administration of intravenous contrast. Multiplanar reconstructed images and MIPs were obtained and reviewed to evaluate the vascular anatomy. CONTRAST:  138mL OMNIPAQUE IOHEXOL 350 MG/ML SOLN COMPARISON:  Abdominal CT 03/20/2015 FINDINGS: CTA  CHEST FINDINGS THORACIC INLET/BODY WALL: Goiter with moderate transverse tracheal narrowing. Minimal transverse dimension is 11 mm. MEDIASTINUM: Normal heart size. No pericardial effusion. No aortic intramural hematoma or dissection. No adenopathy. LUNG WINDOWS: No consolidation.  No effusion. Subsegmental atelectasis at the bases. Calcified pulmonary nodule in the right lower lobe. OSSEOUS: No acute fracture.  No suspicious lytic or blastic lesions.  Review of the MIP images confirms the above findings. CTA ABDOMEN AND PELVIS FINDINGS Abdominal wall:  Fatty umbilical hernia. Hepatobiliary: Hepatic steatosis, mild based on pre-contrast.No evidence of biliary obstruction or stone. Pancreas: Unremarkable. Spleen: Unremarkable. Adrenals/Urinary Tract: Negative adrenals. Punctate lower pole left nephrolithiasis. No hydronephrosis or ureteral calculus. Unremarkable bladder. Reproductive:Hysterectomy and probable oophorectomies. Stomach/Bowel:  No obstruction. No appendicitis. Vascular/Lymphatic: Standard aortic branching without aneurysm, dissection, or atheromatous changes. No stenosis. No mass or adenopathy. Peritoneal: No ascites or pneumoperitoneum. Musculoskeletal: No acute abnormalities. Review of the MIP images confirms the above findings. IMPRESSION: 1. Normal CTA of the aorta.  No acute finding. 2. Goiter with moderate transverse tracheal narrowing. 3. Small left renal stone. Electronically Signed   By: Monte Fantasia M.D.   On: 06/10/2015 06:47   Ct Angio Abd/pel W/ And/or W/o  06/10/2015  CLINICAL DATA:  Epigastric pain radiating through the back. EXAM: CT ANGIOGRAPHY CHEST, ABDOMEN AND PELVIS TECHNIQUE: Multidetector CT imaging through the chest, abdomen and pelvis was performed using the standard protocol during bolus administration of intravenous contrast. Multiplanar reconstructed images and MIPs were obtained and reviewed to evaluate the vascular anatomy. CONTRAST:  121mL OMNIPAQUE IOHEXOL 350 MG/ML SOLN COMPARISON:  Abdominal CT 03/20/2015 FINDINGS: CTA CHEST FINDINGS THORACIC INLET/BODY WALL: Goiter with moderate transverse tracheal narrowing. Minimal transverse dimension is 11 mm. MEDIASTINUM: Normal heart size. No pericardial effusion. No aortic intramural hematoma or dissection. No adenopathy. LUNG WINDOWS: No consolidation.  No effusion. Subsegmental atelectasis at the bases. Calcified pulmonary nodule in the right lower lobe. OSSEOUS: No  acute fracture.  No suspicious lytic or blastic lesions. Review of the MIP images confirms the above findings. CTA ABDOMEN AND PELVIS FINDINGS Abdominal wall:  Fatty umbilical hernia. Hepatobiliary: Hepatic steatosis, mild based on pre-contrast.No evidence of biliary obstruction or stone. Pancreas: Unremarkable. Spleen: Unremarkable. Adrenals/Urinary Tract: Negative adrenals. Punctate lower pole left nephrolithiasis. No hydronephrosis or ureteral calculus. Unremarkable bladder. Reproductive:Hysterectomy and probable oophorectomies. Stomach/Bowel:  No obstruction. No appendicitis. Vascular/Lymphatic: Standard aortic branching without aneurysm, dissection, or atheromatous changes. No stenosis. No mass or adenopathy. Peritoneal: No ascites or pneumoperitoneum. Musculoskeletal: No acute abnormalities. Review of the MIP images confirms the above findings. IMPRESSION: 1. Normal CTA of the aorta.  No acute finding. 2. Goiter with moderate transverse tracheal narrowing. 3. Small left renal stone. Electronically Signed   By: Monte Fantasia M.D.   On: 06/10/2015 06:47    Medications  gi cocktail (Maalox,Lidocaine,Donnatal) (30 mLs Oral Given 06/10/15 0623)  sodium chloride 0.9 % bolus 1,000 mL (0 mLs Intravenous Stopped 06/10/15 0728)  iohexol (OMNIPAQUE) 350 MG/ML injection 100 mL (100 mLs Intravenous Contrast Given 06/10/15 0557)    Given family history and symptoms patient was ruled out for dissection.  No murphy's sign no stones in the GB on CT. Symptoms are more consistent with GERD.  Has had Elevated LFTs.  Will change GERD meds to protonix.  No greasy or spicy foods.  Follow up with Dr. Ronnald Ramp regarding your LFTs and also your goiter you will need outpatient thyroid testing.  Strict abdominal pain return precautions given.  Patient and wife verbalize understanding and agree  to follow up   Rodrick Payson, MD 06/10/15 [phone]

## 2015-06-10 NOTE — ED Notes (Signed)
Pt complains of central abdominal pain that woke her from sleeping, no vomiting or diarrhea

## 2015-06-10 NOTE — ED Notes (Signed)
Patient transported to CT 

## 2015-06-10 NOTE — ED Notes (Signed)
Patient will take motrin at home. She declined medication here.

## 2015-06-10 NOTE — ED Notes (Signed)
Pt states epigastric pain onset 3am, pressure radiating through to back, pt denies n/v. After arriving at ED, sudden bout of emesis x 1 with no lingering nausea. + cardiac family hx.

## 2015-07-22 ENCOUNTER — Other Ambulatory Visit: Payer: Self-pay | Admitting: Gastroenterology

## 2015-09-11 ENCOUNTER — Emergency Department (HOSPITAL_COMMUNITY): Payer: Medicaid Other

## 2015-09-11 ENCOUNTER — Emergency Department (HOSPITAL_COMMUNITY)
Admission: EM | Admit: 2015-09-11 | Discharge: 2015-09-11 | Disposition: A | Discharge status: Home / Self Care | Payer: Medicaid Other | Attending: Emergency Medicine | Admitting: Emergency Medicine

## 2015-09-11 ENCOUNTER — Encounter (HOSPITAL_COMMUNITY): Payer: Self-pay

## 2015-09-11 DIAGNOSIS — F329 Major depressive disorder, single episode, unspecified: Secondary | ICD-10-CM | POA: Diagnosis not present

## 2015-09-11 DIAGNOSIS — Z7982 Long term (current) use of aspirin: Secondary | ICD-10-CM | POA: Insufficient documentation

## 2015-09-11 DIAGNOSIS — Z87440 Personal history of urinary (tract) infections: Secondary | ICD-10-CM | POA: Insufficient documentation

## 2015-09-11 DIAGNOSIS — Z8542 Personal history of malignant neoplasm of other parts of uterus: Secondary | ICD-10-CM | POA: Diagnosis not present

## 2015-09-11 DIAGNOSIS — J069 Acute upper respiratory infection, unspecified: Secondary | ICD-10-CM | POA: Insufficient documentation

## 2015-09-11 DIAGNOSIS — Z7984 Long term (current) use of oral hypoglycemic drugs: Secondary | ICD-10-CM | POA: Insufficient documentation

## 2015-09-11 DIAGNOSIS — Z87442 Personal history of urinary calculi: Secondary | ICD-10-CM | POA: Insufficient documentation

## 2015-09-11 DIAGNOSIS — E119 Type 2 diabetes mellitus without complications: Secondary | ICD-10-CM | POA: Diagnosis not present

## 2015-09-11 DIAGNOSIS — K219 Gastro-esophageal reflux disease without esophagitis: Secondary | ICD-10-CM | POA: Diagnosis not present

## 2015-09-11 DIAGNOSIS — Z87448 Personal history of other diseases of urinary system: Secondary | ICD-10-CM | POA: Insufficient documentation

## 2015-09-11 DIAGNOSIS — Z862 Personal history of diseases of the blood and blood-forming organs and certain disorders involving the immune mechanism: Secondary | ICD-10-CM | POA: Diagnosis not present

## 2015-09-11 DIAGNOSIS — E7800 Pure hypercholesterolemia, unspecified: Secondary | ICD-10-CM | POA: Diagnosis not present

## 2015-09-11 DIAGNOSIS — R05 Cough: Secondary | ICD-10-CM | POA: Diagnosis present

## 2015-09-11 DIAGNOSIS — E049 Nontoxic goiter, unspecified: Secondary | ICD-10-CM | POA: Insufficient documentation

## 2015-09-11 IMAGING — CR DG CHEST 2V
2 series · 2 of 2 positions shown · non-contrast
Comparison: CT chest [DATE].  Diagnostic chest [DATE].

CLINICAL DATA: Productive cough with chest pain and history of
diabetes.

EXAM:
CHEST  2 VIEW

[w chest pa]
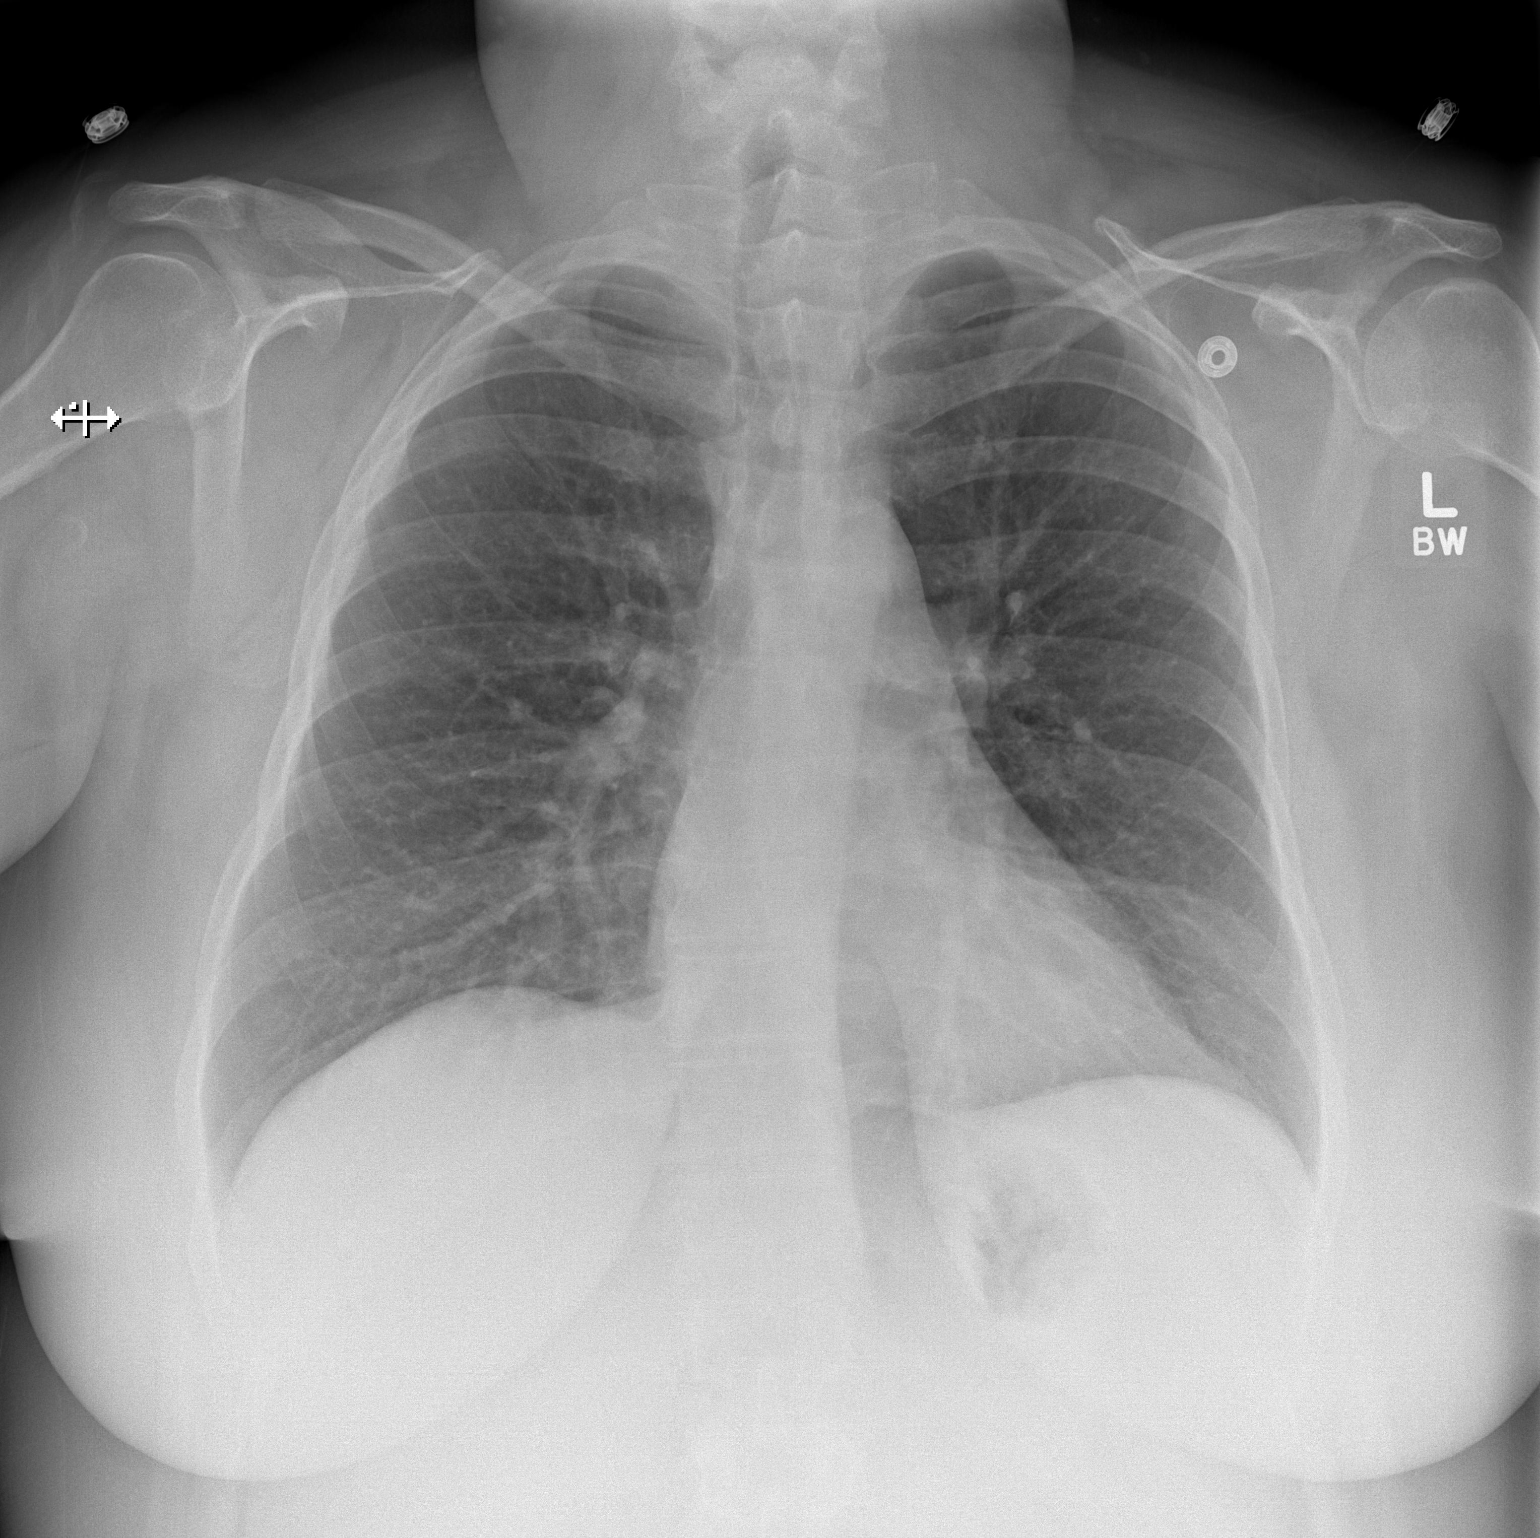

[w chest lat]
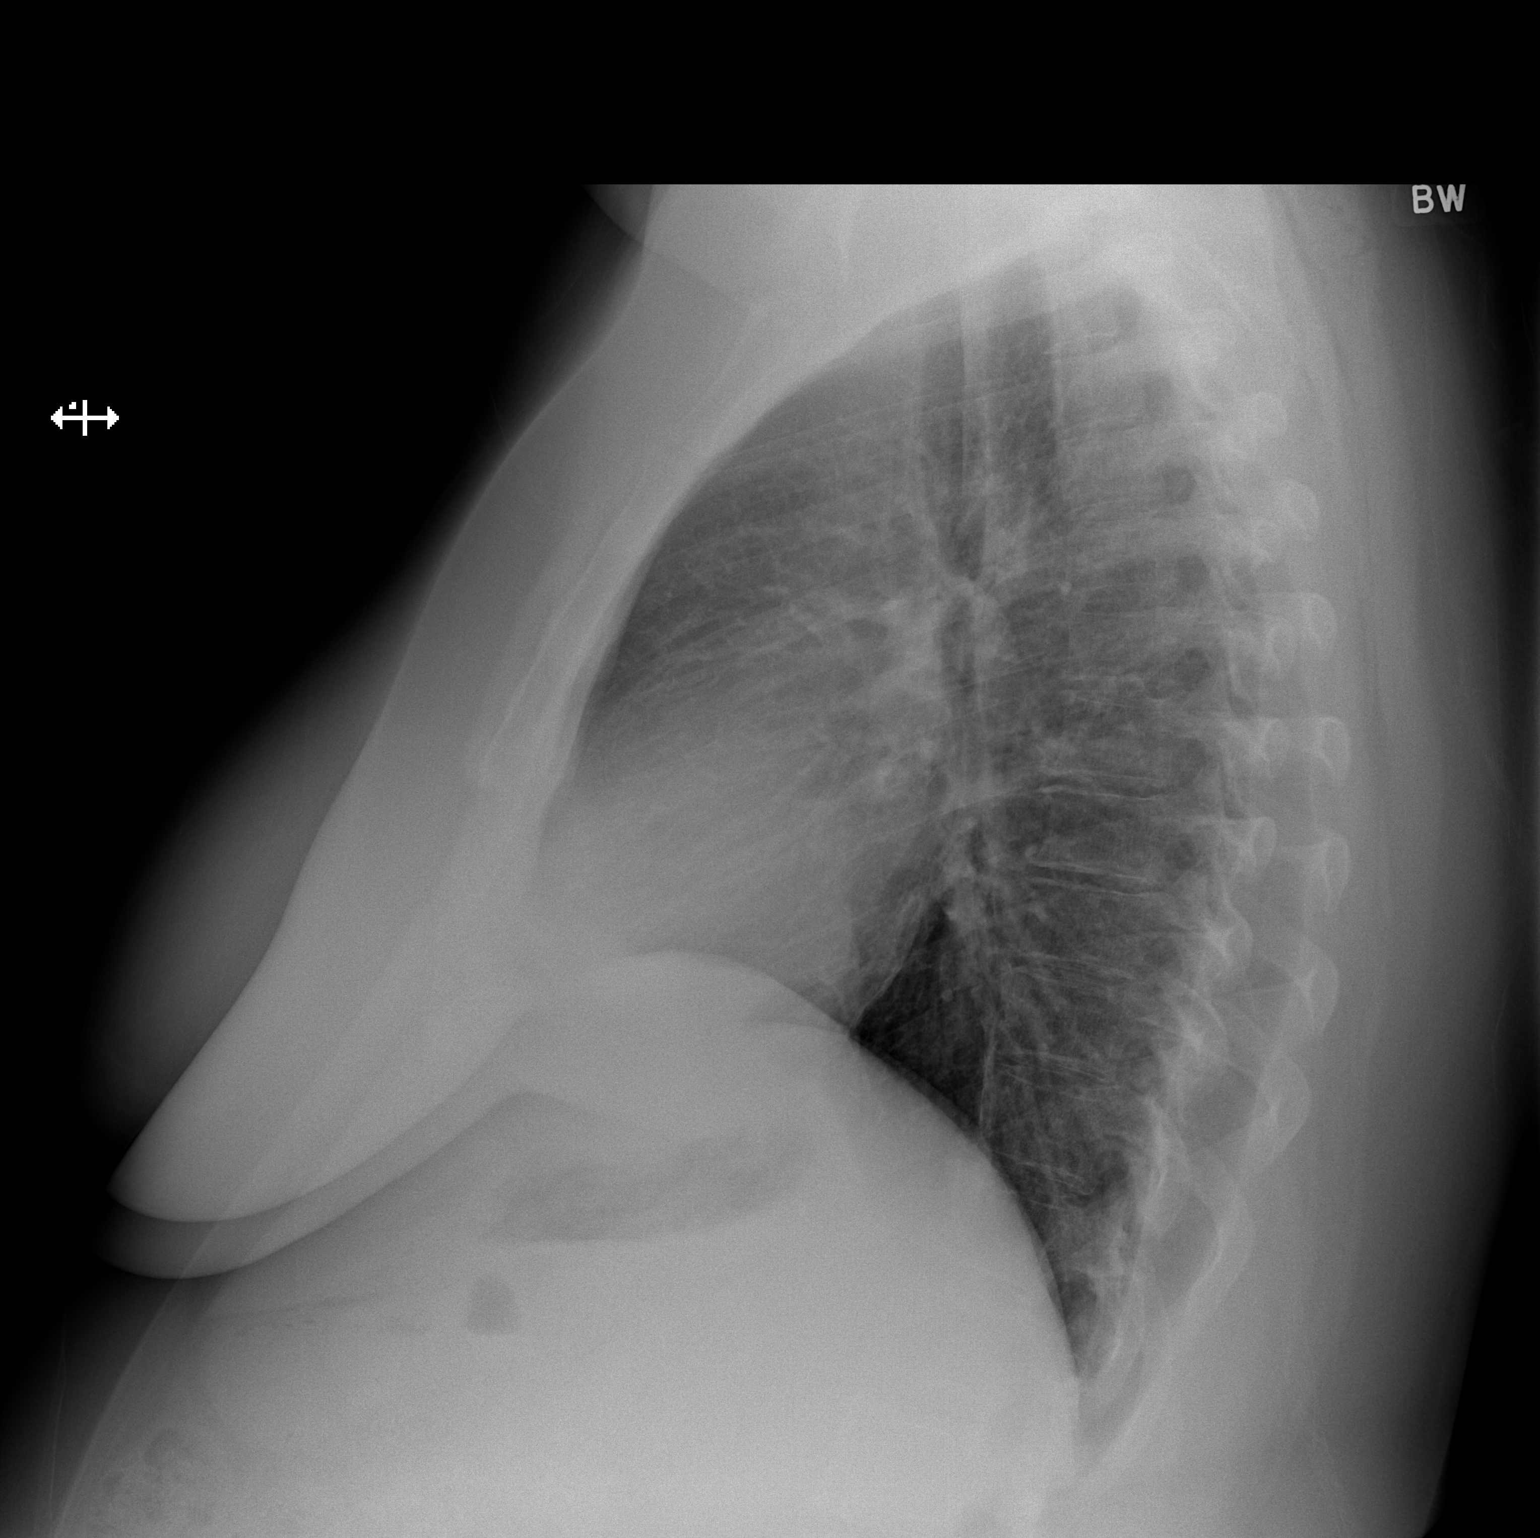

[2 of 2 positions shown; findings below may reference images not displayed]

FINDINGS: Significant narrowing of the subglottic trachea due to goiter. Mass
effect on the trachea LEFT-to-RIGHT. No active infiltrates or
failure. Normal heart size. No effusion or pneumothorax. Bones
unremarkable.
IMPRESSION: Significant tracheal narrowing related to border, larger on the
LEFT. No active infiltrates or failure. Worsening mass effect on the
trachea from [DATE].

## 2015-09-11 MED ORDER — BENZONATATE 100 MG PO CAPS: 100.0000 mg | ORAL_CAPSULE | Freq: Three times a day (TID) | ORAL | Status: DC

## 2015-09-11 MED ORDER — AZITHROMYCIN 250 MG PO TABS: 250.0000 mg | ORAL_TABLET | Freq: Every day | ORAL | Status: DC

## 2015-09-11 NOTE — Discharge Instructions (Signed)
1. Medications: Please take all of your antibiotics until finished!, tessalon for cough, usual home medications 2. Treatment: rest, drink plenty of fluids, take tylenol or ibuprofen for fever control if needed 3. Follow Up: Please follow up with your primary doctor in 3 days for discussion of your diagnoses and further evaluation after today's visit - please bring the copy of your x-ray report to the appointment - It is very important that you follow up with your primary doctor about today's findings. Return to the ER for high fevers, difficulty breathing or other concerning symptoms

## 2015-09-11 NOTE — ED Provider Notes (Signed)
CSN: [id number]     Arrival date & time 09/11/15  1249 History   By signing my name below, I, Rhonda Higgins, attest that this documentation has been prepared under the direction and in the presence of [place], PA-C  Electronically Signed: Nicole Higgins, ED Scribe 09/11/2015 at 4:11 PM.  Chief Complaint  Patient presents with  . Cough   The history is provided by the patient. No language interpreter was used.   HPI Comments: Rhonda Higgins is a 45 y.o. female with PMHx of DM, goiter, and HLD who presents to the Emergency Department complaining of gradual onset, constant, non-productive cough, onset about 1.5 weeks ago. She states the cough initially improved but now has worsened in the last week. Pt reports associated chest congestion and 3/10 sore throat. Pt has taken robitussin with minimal relief to symptoms.  No other worsening or alleviating factors noted. Pt denies fever, shortness of breath, difficulty swallowing ,or any other pertinent symptoms. She is a non-smoker. She takes zyrtec every night for allergies.   Past Medical History  Diagnosis Date  . Allergic rhinitis   . Hyperlipidemia   . Diabetes mellitus   . Personality disorder   . Overactive bladder   . UTI (urinary tract infection)   . Depression   . GERD (gastroesophageal reflux disease)   . Non-insulin dependent type 2 diabetes mellitus ([place])   . Anemia   . Elevated cholesterol   . Right lower quadrant pain   . Polyphagia   . Uterine cancer ([place])   . 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 Kidney stone 6/13    H/O  . 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 Inner ear surgery      L ear d/t tumor and R ear d/t hole in ear drum  . Tonsillectomy      age 58  . Total abdominal hysterectomy  feb 2011    BSO, node dissection  . Endometrial biopsy     Family History  Problem Relation Age of Onset  . Asthma  Mother   . Heart disease Mother   . Diabetes Mother   . Emphysema Mother   . Hypertension Mother   . Stroke Mother   . Prostate cancer Father   . Heart disease Sister   . Heart attack Brother   . Heart disease Brother   . Heart disease Sister   . Diabetes Sister    Social History  Substance Use Topics  . Smoking status: Never Smoker   . Smokeless tobacco: Never Used  . Alcohol Use: Yes   OB History    Gravida Para Term Preterm AB TAB SAB Ectopic Multiple Living   0              Review of Systems  Constitutional: Negative for fever.  HENT: Positive for congestion and sore throat.        Chest congestion noted.   Respiratory: Positive for cough. Negative for shortness of breath.     Allergies  Hydrocodone-acetaminophen and Codeine  Home Medications   Prior to Admission medications   Medication Sig Start Date End Date Taking? Authorizing Provider  aspirin 81 MG tablet Take 162 mg by mouth every evening.     Historical Provider, MD  azithromycin (ZITHROMAX) 250 MG tablet Take 1 tablet (250 mg total) by mouth daily. Take first 2 tablets together, then 1 every  day until finished. 09/11/15   Rhonda Almond Rowen Wilmer, PA-C  benzonatate (TESSALON) 100 MG capsule Take 1 capsule (100 mg total) by mouth every 8 (eight) hours. 09/11/15   Rhonda Almond Dezmin Kittelson, PA-C  buPROPion (WELLBUTRIN XL) 150 MG 24 hr tablet Take 150 mg by mouth at bedtime.     Historical Provider, MD  carvedilol (COREG) 3.125 MG tablet Take 1 tablet (3.125 mg total) by mouth 2 (two) times daily with a meal. Patient not taking: Reported on 01/09/2015 07/20/14   Oswald Hillock, MD  cetirizine (ZYRTEC) 10 MG tablet Take 10 mg by mouth at bedtime.     Historical Provider, MD  gabapentin (NEURONTIN) 400 MG capsule Take 400-1,200 mg by mouth 3 (three) times daily. Take 1 Tablet by mouth in the am, Take 1 Tablet by mouth in the afternoon and Take 3 Tablets by mouth at bedtime.    Historical Provider, MD  glimepiride (AMARYL) 4 MG  tablet Take 8 mg by mouth daily. 12/21/14   Historical Provider, MD  ibuprofen (ADVIL,MOTRIN) 200 MG tablet Take 400 mg by mouth every 6 (six) hours as needed for moderate pain (pain).     Historical Provider, MD  ibuprofen (ADVIL,MOTRIN) 800 MG tablet Take 800 mg by mouth every 8 (eight) hours as needed for moderate pain.    Historical Provider, MD  lamoTRIgine (LAMICTAL) 200 MG tablet Take 200 mg by mouth every evening.  02/24/15   Historical Provider, MD  lisinopril (PRINIVIL,ZESTRIL) 5 MG tablet Take 5 mg by mouth daily.    Historical Provider, MD  omeprazole (PRILOSEC) 40 MG capsule Take 40 mg by mouth at bedtime.     Historical Provider, MD  pantoprazole (PROTONIX) 20 MG tablet Take 1 tablet (20 mg total) by mouth daily. 06/10/15   April Palumbo, MD  risperiDONE (RISPERDAL) 1 MG tablet Take 1-2 mg by mouth as directed. Take 1 tablet (1 mg) in the morning and Take 2 tablets (2 mg) at bedtime. 12/30/14   Historical Provider, MD  simvastatin (ZOCOR) 40 MG tablet Take 40 mg by mouth every evening.    Historical Provider, MD  sitaGLIPtin-metformin (JANUMET) 50-1000 MG tablet Take 1 tablet by mouth 2 (two) times daily with a meal.    Historical Provider, MD   BP 144/68 mmHg  Pulse 96  Temp(Src) 98.5 F (36.9 C) (Oral)  Resp 20  SpO2 98%  Physical Exam  Constitutional: She is oriented to person, place, and time. She appears well-developed and well-nourished.  HENT:  Head: Normocephalic and atraumatic.  Oropharynx with erythema, no tonsillar hypertrophy, no exudate. Positive nasal congestion and edema. No focal sinus tenderness.  Eyes: EOM are normal.  Neck: Normal range of motion. Neck supple. No tracheal deviation present.  Cardiovascular: Normal rate, regular rhythm and normal heart sounds.   No murmur heard. Pulmonary/Chest: Effort normal and breath sounds normal. No respiratory distress. She has no wheezes. She has no rales. She exhibits no tenderness.  Abdominal: Soft. Bowel sounds are  normal. She exhibits no distension. There is no tenderness.  Musculoskeletal: Normal range of motion.  Lymphadenopathy:    She has cervical adenopathy.  Neurological: She is alert and oriented to person, place, and time.  Skin: Skin is warm and dry.  Capillary refill less than 3 seconds.  Psychiatric: She has a normal mood and affect.  Nursing note and vitals reviewed.  ED Course  Procedures (including critical care time) DIAGNOSTIC STUDIES: Oxygen Saturation is 98% on RA, normal by my interpretation.  COORDINATION OF CARE: 2:45 PM-Discussed treatment plan which includes CXR, symptom monitoring, and follow up with PCP with pt at bedside and pt agreed to plan.   Labs Review Labs Reviewed - No data to display  Imaging Review Dg Chest 2 View  09/11/2015  CLINICAL DATA:  Productive cough with chest pain and history of diabetes. EXAM: CHEST  2 VIEW COMPARISON:  CT chest 06/10/2015.  Diagnostic chest 07/19/2014. FINDINGS: Significant narrowing of the subglottic trachea due to goiter. Mass effect on the trachea LEFT-to-RIGHT. No active infiltrates or failure. Normal heart size. No effusion or pneumothorax. Bones unremarkable. IMPRESSION: Significant tracheal narrowing related to border, larger on the LEFT. No active infiltrates or failure. Worsening mass effect on the trachea from January 2016. Electronically Signed   By: Staci Righter M.D.   On: 09/11/2015 13:46   I have personally reviewed and evaluated these images as part of my medical decision-making.   EKG Interpretation None      MDM   Final diagnoses:  Goiter  URI (upper respiratory infection)   Rhonda Higgins is afebrile, non-toxic appearing with a clear lung exam. Mild rhinorrhea and OP with erythema, no exudates.  CXR was obtained which shows no focal consolidation-no signs of pneumonia. However, trach on their waiting was seen due to goiter. Patient states she followed up with her primary physician about this goiter  which was seen in 2016. She states that nothing further was done. I encouraged patient to again follow up with primary doc and bring today's report to inform her PCP that this has worsened   Patient states initial improvement, then acute worsening over the last week. Given acute worsening and symptoms longer than 7 days, will treat with azithromycin. Other symptomatic home care instructions were given and close follow up with PCP encouraged.  as needed but spoke at length about emergent, changing, or worsening of symptoms that should prompt return to ER. Patient voices understanding and is agreeable to plan.   Blood pressure 144/68, pulse 96, temperature 98.5 F (36.9 C), temperature source Oral, resp. rate 20, SpO2 98 %.  I personally performed the services described in this documentation, which was scribed in my presence. The recorded information has been reviewed and is accurate.   [facility] Karron Alvizo, PA-C 09/11/15 [place], MD 09/12/15 [phone]

## 2015-09-11 NOTE — ED Notes (Signed)
Pt with cough x 1  1/2 weeks. Tried otc meds but limited as to what she can take.  Pt states no fever.

## 2015-09-28 ENCOUNTER — Other Ambulatory Visit: Payer: Self-pay | Admitting: Surgery

## 2015-09-28 DIAGNOSIS — E049 Nontoxic goiter, unspecified: Secondary | ICD-10-CM

## 2015-10-07 ENCOUNTER — Ambulatory Visit
Admission: RE | Admit: 2015-10-07 | Discharge: 2015-10-07 | Disposition: A | Discharge status: Home / Self Care | Payer: Medicaid Other | Source: Ambulatory Visit | Attending: Surgery | Admitting: Surgery

## 2015-10-07 DIAGNOSIS — E049 Nontoxic goiter, unspecified: Secondary | ICD-10-CM

## 2015-10-07 IMAGING — US US THYROID
1 series · 14 of 25 positions shown · non-contrast
Comparison: None.

CLINICAL DATA: Goiter

EXAM:
THYROID ULTRASOUND
TECHNIQUE: Ultrasound examination of the thyroid gland and adjacent soft
tissues was performed.

[Series 1: us thyroid · 0.08mm/px · 14 of 54 slices shown]
[im 1/54]
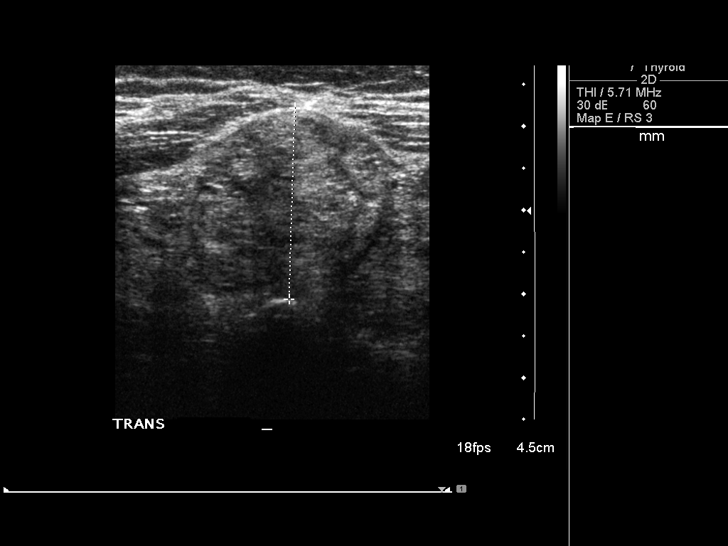
[im 5/54]
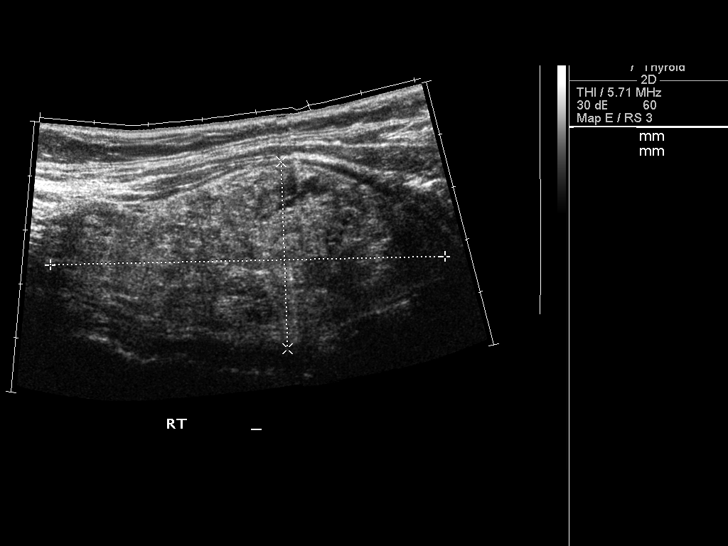
[im 9/54]
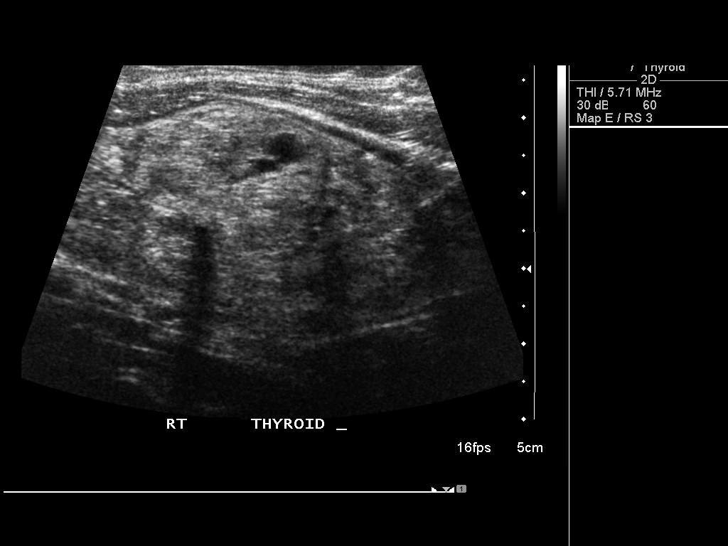
[im 14/54]
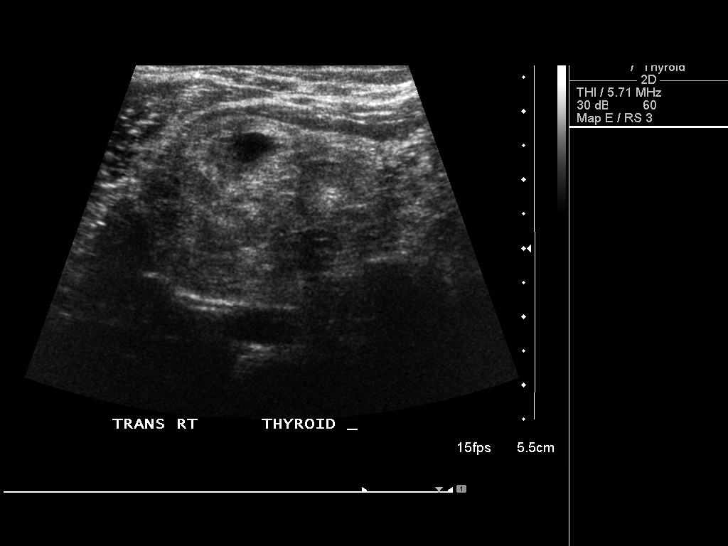
[im 18/54]
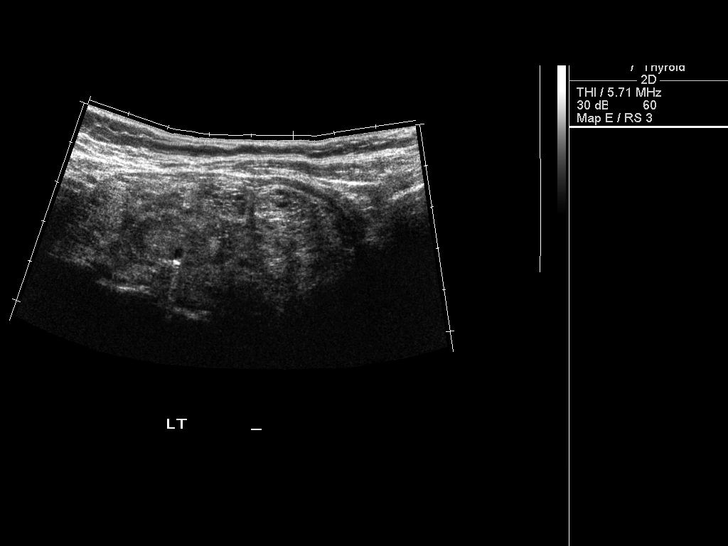
[im 20/54]
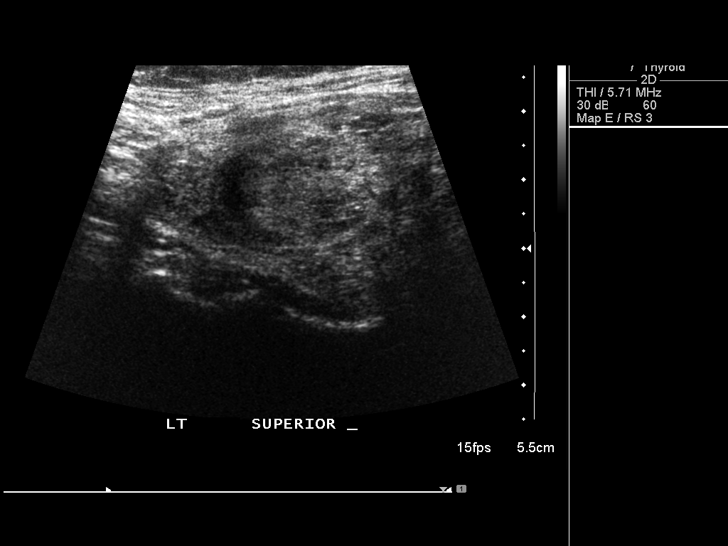
[im 25/54]
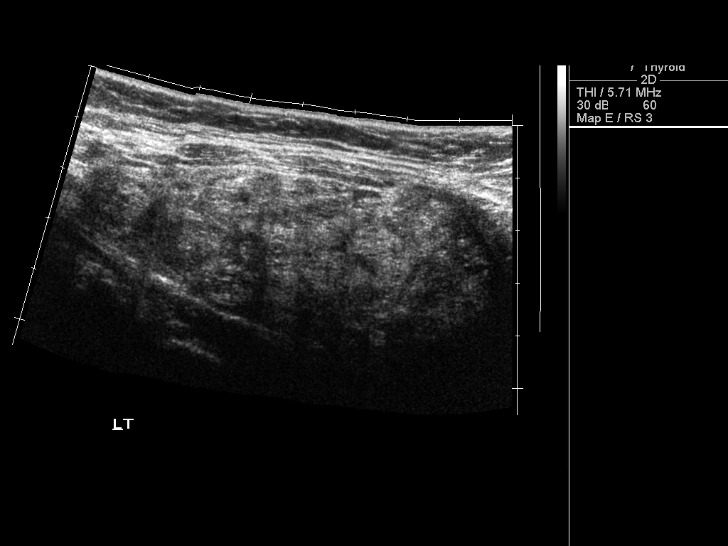
[im 29/54]
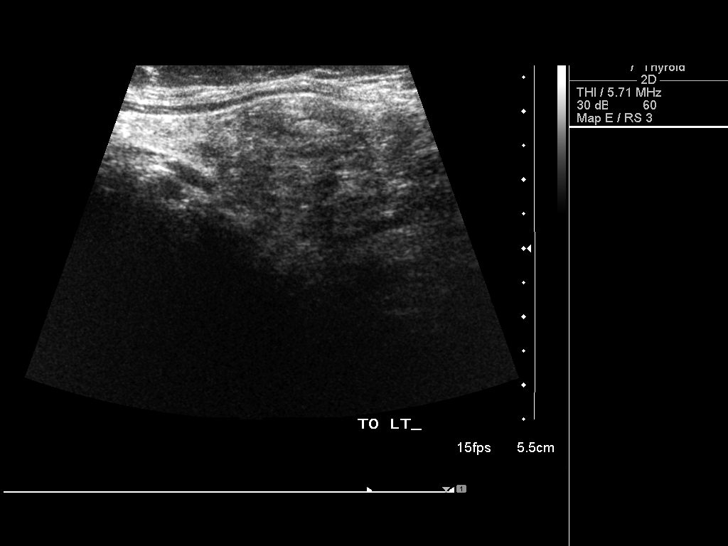
[im 34/54]
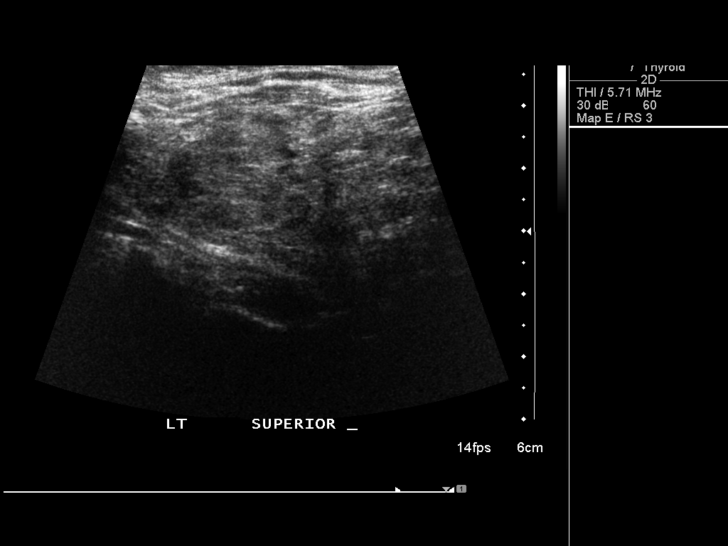
[im 36/54]
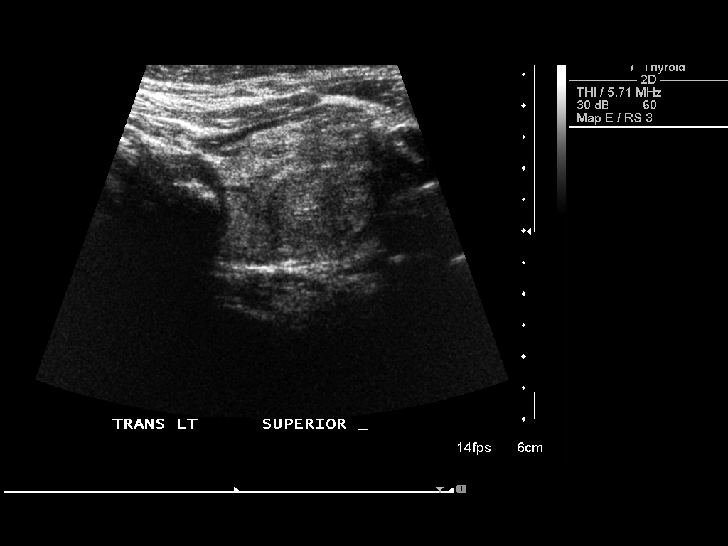
[im 40/54]
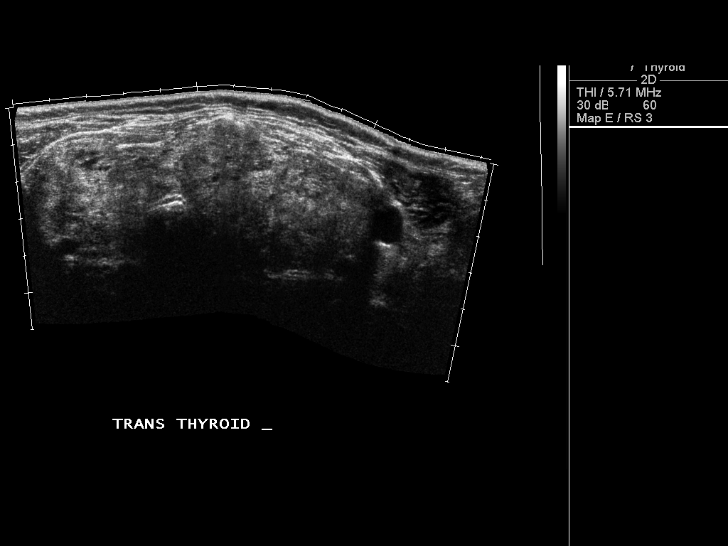
[im 45/54]
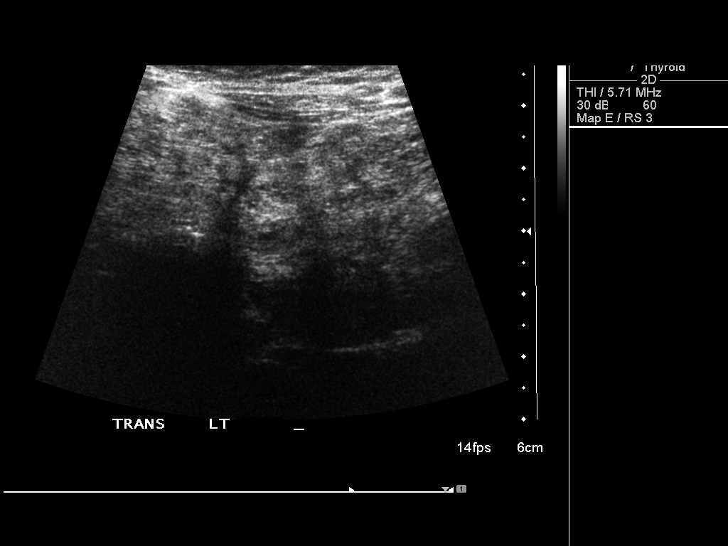
[im 49/54]
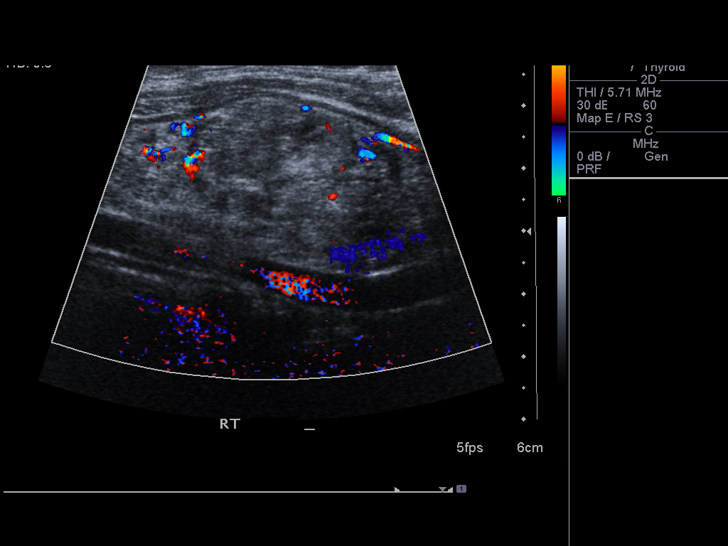
[im 54/54]
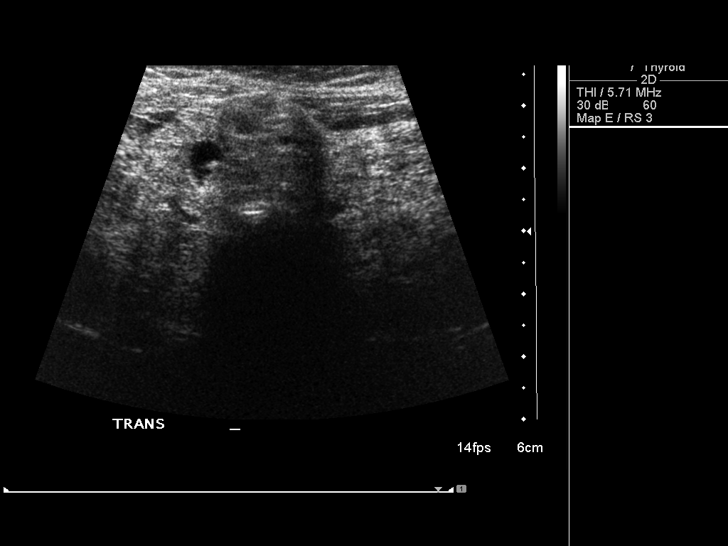

[14 of 25 positions shown; findings below may reference images not displayed]

FINDINGS: Right thyroid lobe

Measurements: 7.3 x 3.5 x 3.8 cm. Heterogeneous and predominately
solid nodule in the right upper pole measures 2.7 x 1.7 x 2.1 cm.
Heterogeneous tissue throughout the remainder of the lobe is noted.

Left thyroid lobe

Measurements: 8.5 x 3.2 x 5.0 cm. There is a mid lobe heterogeneous
solid nodule measuring 2.6 x 1.8 x 1.9 cm.

Isthmus

Thickness: 2.3 cm. There is an isthmic nodule which is solid
measuring 2.5 x 1.7 x 2.6 cm.

Lymphadenopathy

None visualized.
IMPRESSION: Bilateral and isthmic nodules are identified. The dominant nodule is
in the right upper pole measuring 2.7 cm. Findings meet consensus
criteria for biopsy. Ultrasound-guided fine needle aspiration should
be considered, as per the consensus statement: Management of Thyroid
Nodules Detected at US: Society of Radiologists in Ultrasound

## 2015-10-13 ENCOUNTER — Other Ambulatory Visit: Payer: Self-pay | Admitting: Surgery

## 2015-10-13 DIAGNOSIS — E042 Nontoxic multinodular goiter: Secondary | ICD-10-CM

## 2015-10-29 ENCOUNTER — Ambulatory Visit
Admission: RE | Admit: 2015-10-29 | Discharge: 2015-10-29 | Disposition: A | Discharge status: Home / Self Care | Payer: Medicaid Other | Source: Ambulatory Visit | Attending: Surgery | Admitting: Surgery

## 2015-10-29 ENCOUNTER — Other Ambulatory Visit (HOSPITAL_COMMUNITY)
Admission: RE | Admit: 2015-10-29 | Discharge: 2015-10-29 | Disposition: A | Discharge status: Home / Self Care | Payer: Medicaid Other | Source: Ambulatory Visit | Attending: Radiology | Admitting: Radiology

## 2015-10-29 DIAGNOSIS — E042 Nontoxic multinodular goiter: Secondary | ICD-10-CM

## 2015-10-29 IMAGING — US US THYROID BIOPSY
1 series · 13 of 25 positions shown · non-contrast
Comparison: US Thyroid [DATE]

MEDICATIONS:
5 cc 1% lidocaine x 2

COMPLICATIONS:
None immediate.

INDICATION: Indeterminate thyroid nodules

Left thyroid nodule 2.6 cm x 1.8 cm x 1.9 cm
Right thyroid nodule 2.7 cm x 1.7 cm x 2.1 cm
EXAM:
ULTRASOUND GUIDED THYROID FINE NEEDLE ASPIRATION x 2
TECHNIQUE: Informed written consent was obtained from the patient after a
discussion of the risks, benefits and alternatives to treatment.
Questions regarding the procedure were encouraged and answered. A
timeout was performed prior to the initiation of the procedure.

[Series 1: us thyroid biopsy · 0.08mm/px · 32 acquisitions, 13 frames shown]
[im 1/32]
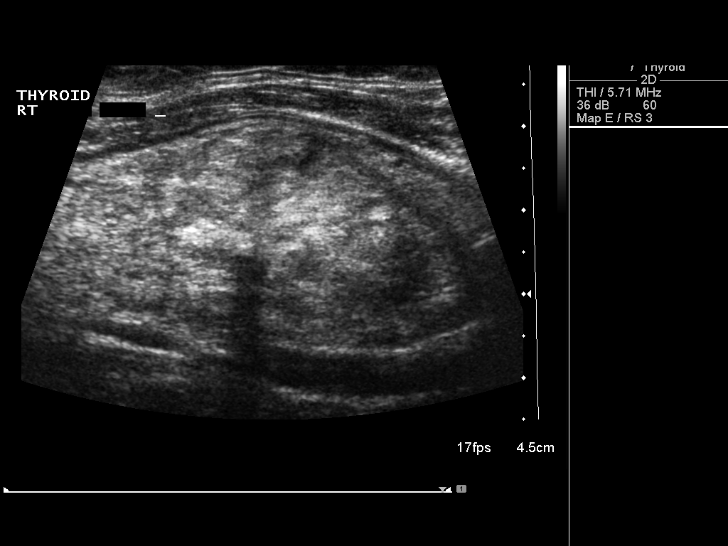
[im 3/32]
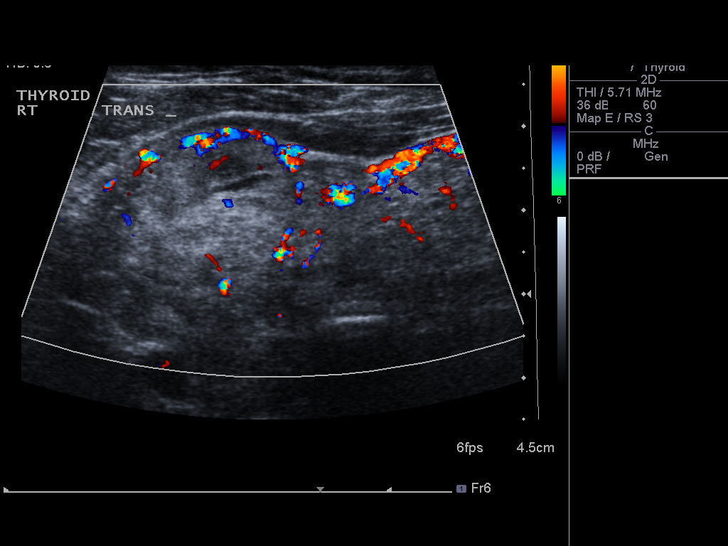
[im 6/32]
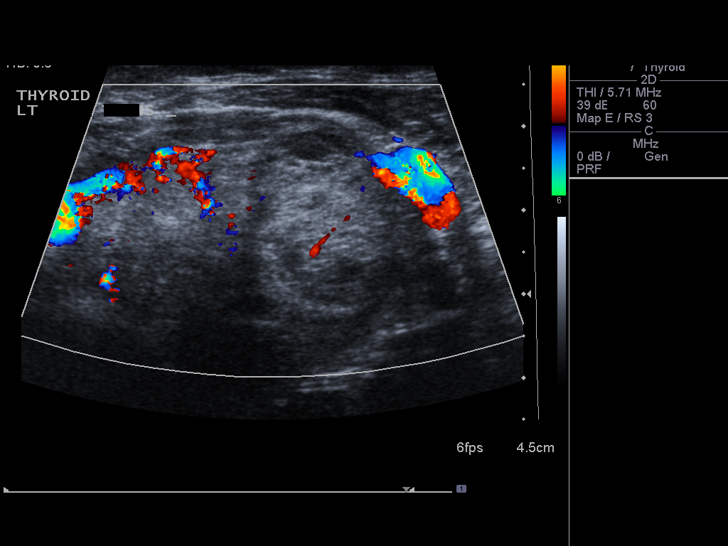
[im 8/32]
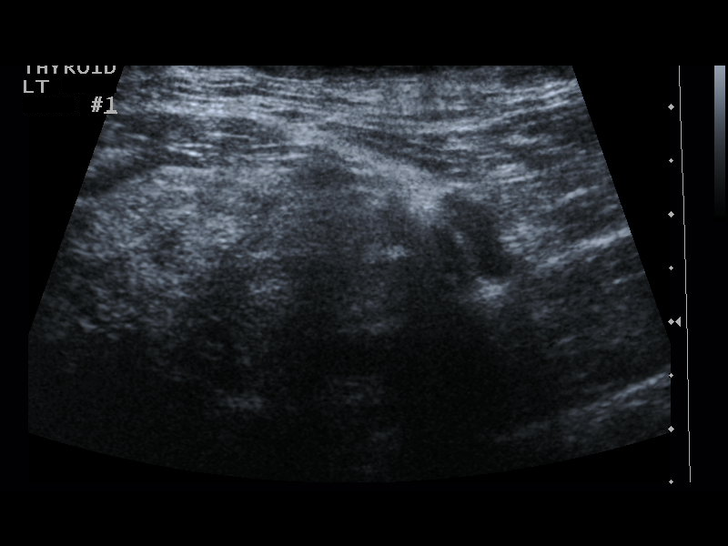
[im 11/32]
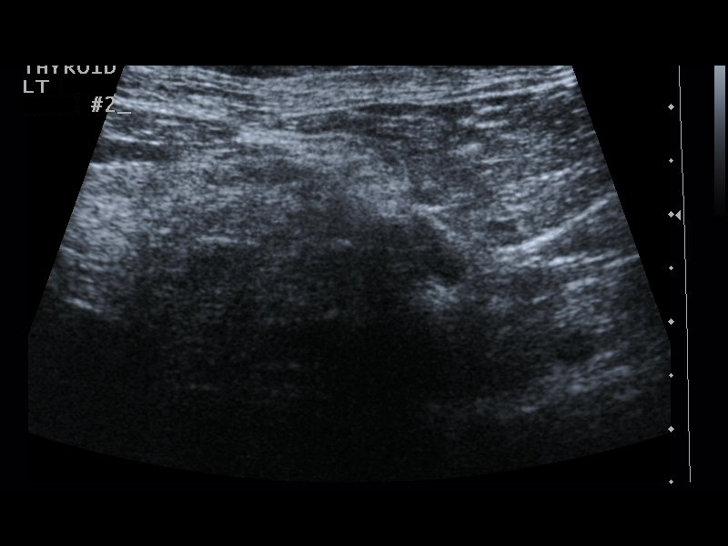
[im 13/32]
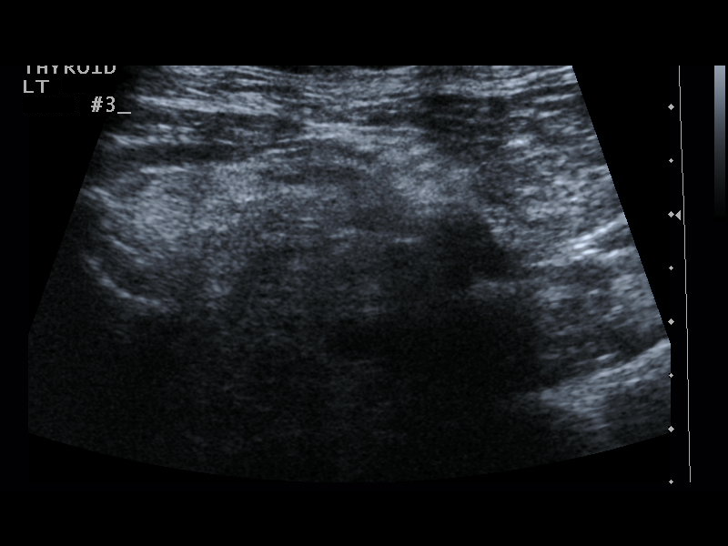
[im 16/32]
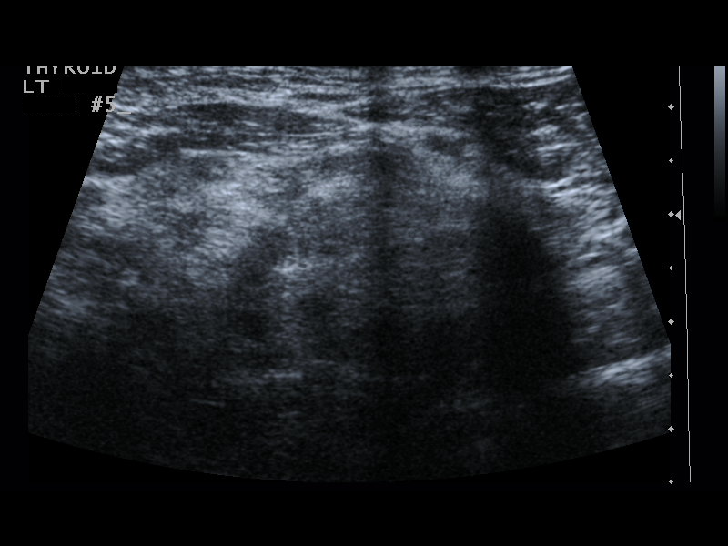
[im 19/32]
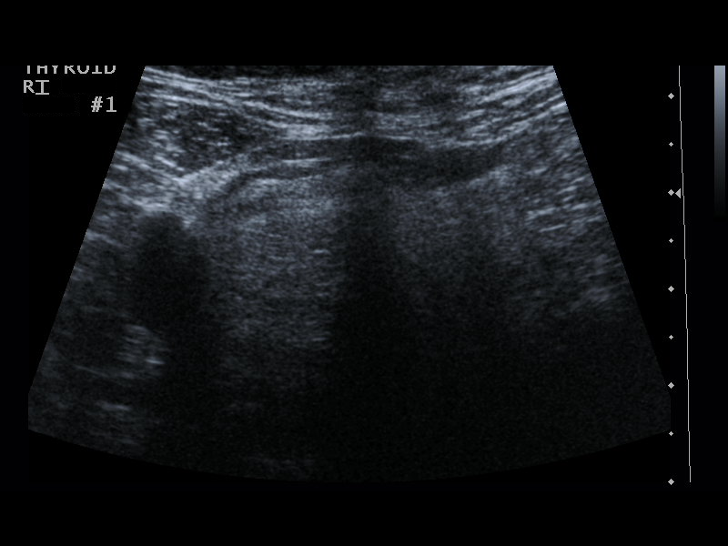
[im 21/32]
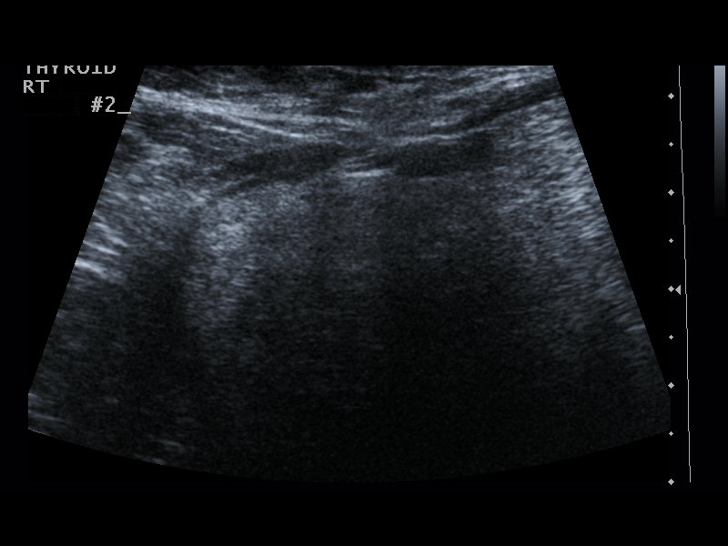
[im 24/32]
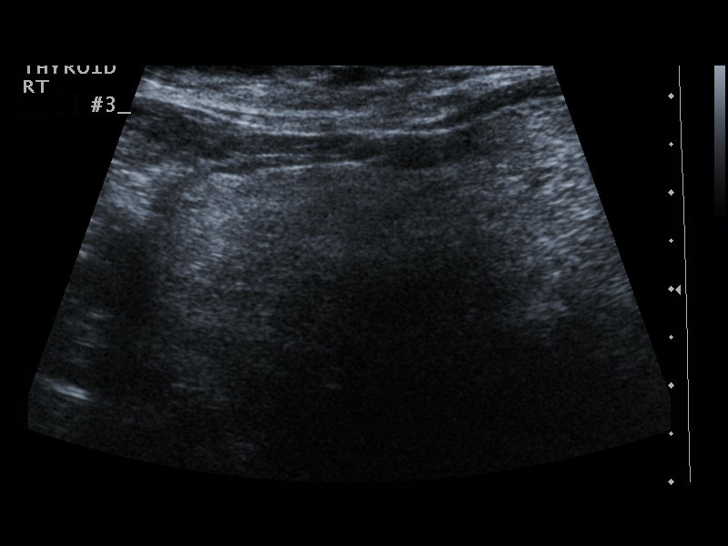
[im 26/32]
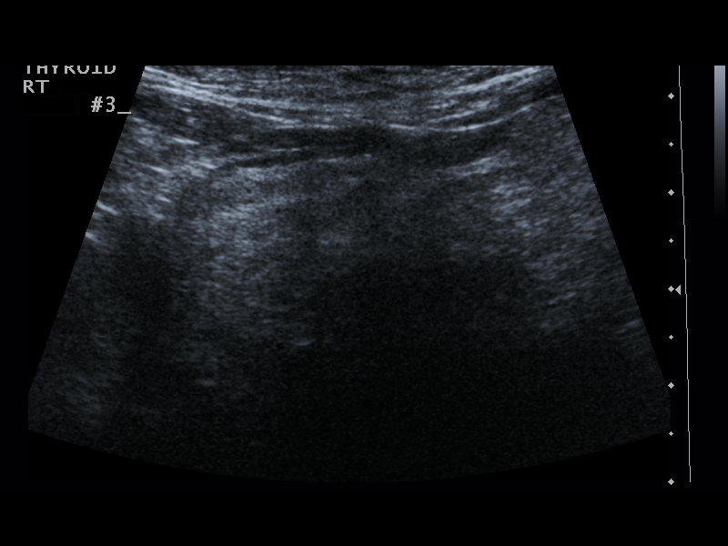
[im 29/32]
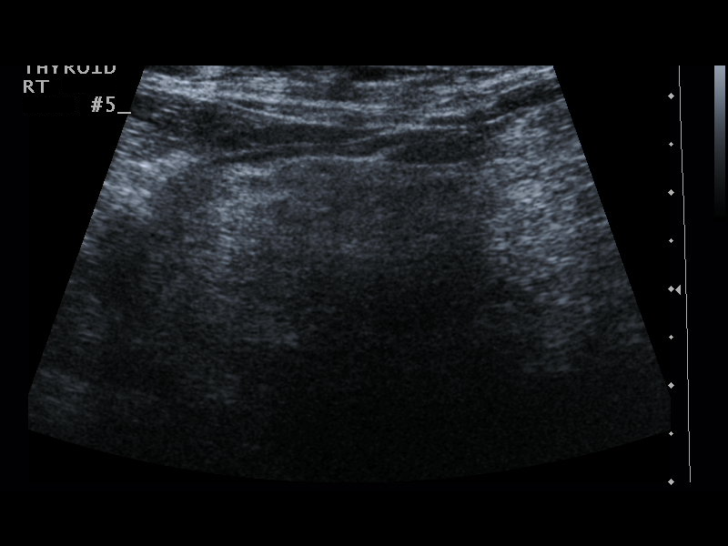
[im 32/32]
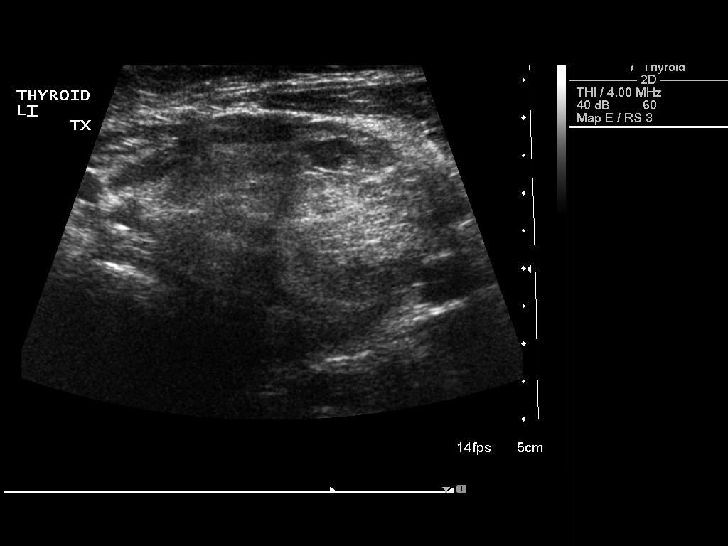

[13 of 25 positions shown; findings below may reference images not displayed]

Pre-procedural ultrasound scanning demonstrated bilateral thyroid
nodules

The procedures were planned. The neck was prepped in the usual
sterile fashion, and a sterile drape was applied covering the
operative field. A timeout was performed prior to the initiation of
the procedure. Local anesthesia was provided with 1% lidocaine.

Under direct ultrasound guidance, 3 FNA biopsies were performed of
the left thyroid nodule with a 25 gauge needle.

2 FNA biopsies were performed of the left thyroid nodule with 25
gauge needle and sent for AFIRMA per ordering LUI.

The samples were prepared and submitted to pathology.

Under direct ultrasound guidance, 3 FNA biopsies were performed of
the right thyroid nodule with a 25 gauge needle.

2 FNA biopsies were performed of thr right thyroid nodule with 25
gauge needle and sent for AFIRMA per ordering LUI.

The samples were prepared and submitted to pathology.

Limited post procedural scanning was negative for hematoma or
additional complication. Dressings were placed. The patient
tolerated the above procedures procedure well without immediate
postprocedural complication.
IMPRESSION: Technically successful ultrasound guided fine needle aspiration of
bilateral thyroid nodules.

Read by:  LUI

## 2015-11-08 ENCOUNTER — Ambulatory Visit: Payer: Self-pay | Admitting: Surgery

## 2015-11-10 ENCOUNTER — Other Ambulatory Visit (HOSPITAL_COMMUNITY): Payer: Self-pay | Admitting: Anesthesiology

## 2015-11-10 NOTE — Progress Notes (Signed)
eccho 2/16 epic ekg 12/16 epic Ov 5/18,17  dr [name], pcp, and 09/15/15 on chart

## 2015-11-10 NOTE — Patient Instructions (Addendum)
Rhonda Higgins  11/10/2015   Your procedure is scheduled on: 11/19/15  FRIDAY  Report to [facility]  Entrance take [facility]  elevators to 3rd floor to  [place] at 1225 pm  Call this number if you have problems the morning of surgery [phone]   Remember: ONLY 1 PERSON MAY GO WITH YOU TO SHORT STAY TO GET  READY MORNING OF YOUR SURGERY. HAVE SNACK BEFORE BEDTIME/ MIDNIGHT Thursday NIGHT  Do not eat food   After Midnight. Thursday night.  May have clear liquids until 08:15 am_--then nothing by mouth     Take these medicines the morning of surgery with A SIP OF WATER:GABAPENTIN, RISPERIDONE  DO NOT TAKE ANY DIABETIC MEDICATIONS DAY OF YOUR SURGERY                               You may not have any metal on your body including hair pins and              piercings  Do not wear jewelry, make-up, lotions, powders or perfumes, deodorant             Do not wear nail polish.  Do not shave  48 hours prior to surgery.              Men may shave face and neck.   Do not bring valuables to the hospital. [place].  Contacts, dentures or bridgework may not be worn into surgery.  Leave suitcase in the car. After surgery it may be brought to your room.    REMEMBER--- STOP ALL ASPIRIN, ANTIINFLAMMATORIES, HERBAL SUPPLEMENTS 5 DAYS BEFORE SURGERY AS PER DR Tera Helper ORDER              Please read over the following fact sheets you were given: _____________________________________________________________________             [facility] - Preparing for Surgery Before surgery, you can play an important role.  Because skin is not sterile, your skin needs to be as free of germs as possible.  You can reduce the number of germs on your skin by washing with CHG (chlorahexidine gluconate) soap before surgery.  CHG is an antiseptic cleaner which kills germs and bonds with the skin to continue killing germs even after  washing. Please DO NOT use if you have an allergy to CHG or antibacterial soaps.  If your skin becomes reddened/irritated stop using the CHG and inform your nurse when you arrive at Short Stay. Do not shave (including legs and underarms) for at least 48 hours prior to the first CHG shower.  You may shave your face/neck. Please follow these instructions carefully:  1.  Shower with CHG Soap the night before surgery and the  morning of Surgery.  2.  If you choose to wash your hair, wash your hair first as usual with your  normal  shampoo.  3.  After you shampoo, rinse your hair and body thoroughly to remove the  shampoo.                           4.  Use CHG as you would any other liquid soap.  You  can apply chg directly  to the skin and wash                       Gently with a scrungie or clean washcloth.  5.  Apply the CHG Soap to your body ONLY FROM THE NECK DOWN.   Do not use on face/ open                           Wound or open sores. Avoid contact with eyes, ears mouth and genitals (private parts).                       Wash face,  Genitals (private parts) with your normal soap.             6.  Wash thoroughly, paying special attention to the area where your surgery  will be performed.  7.  Thoroughly rinse your body with warm water from the neck down.  8.  DO NOT shower/wash with your normal soap after using and rinsing off  the CHG Soap.                9.  Pat yourself dry with a clean towel.            10.  Wear clean pajamas.            11.  Place clean sheets on your bed the night of your first shower and do not  sleep with pets. Day of Surgery : Do not apply any lotions/deodorants the morning of surgery.  Please wear clean clothes to the hospital/surgery center.  FAILURE TO FOLLOW THESE INSTRUCTIONS MAY RESULT IN THE CANCELLATION OF YOUR SURGERY PATIENT SIGNATURE_________________________________  NURSE  SIGNATURE__________________________________  ________________________________________________________________________    CLEAR LIQUID DIET   Foods Allowed                                                                     Foods Excluded  Coffee and tea, regular and decaf                             liquids that you cannot  Plain Jell-O in any flavor                                             see through such as: Fruit ices (not with fruit pulp)                                     milk, soups, orange juice  Iced Popsicles                                    All solid food Carbonated beverages, regular and diet  Cranberry, grape and apple juices Sports drinks like Gatorade Lightly seasoned clear broth or consume(fat free) Sugar, honey syrup  Sample Menu Breakfast                                Lunch                                     Supper Cranberry juice                    Beef broth                            Chicken broth Jell-O                                     Grape juice                           Apple juice Coffee or tea                        Jell-O                                      Popsicle                                                Coffee or tea                        Coffee or tea  _____________________________________________________________________

## 2015-11-11 ENCOUNTER — Ambulatory Visit (HOSPITAL_COMMUNITY)
Admission: RE | Admit: 2015-11-11 | Discharge: 2015-11-11 | Disposition: A | Discharge status: Home / Self Care | Payer: Medicaid Other | Source: Ambulatory Visit | Attending: Anesthesiology | Admitting: Anesthesiology

## 2015-11-11 ENCOUNTER — Encounter (HOSPITAL_COMMUNITY): Payer: Self-pay

## 2015-11-11 ENCOUNTER — Encounter (HOSPITAL_COMMUNITY)
Admission: RE | Admit: 2015-11-11 | Discharge: 2015-11-11 | Disposition: A | Discharge status: Home / Self Care | Payer: Medicaid Other | Source: Ambulatory Visit | Attending: Surgery | Admitting: Surgery

## 2015-11-11 DIAGNOSIS — E041 Nontoxic single thyroid nodule: Secondary | ICD-10-CM | POA: Insufficient documentation

## 2015-11-11 DIAGNOSIS — Z01818 Encounter for other preprocedural examination: Secondary | ICD-10-CM | POA: Diagnosis not present

## 2015-11-11 LAB — CBC
HCT: 42.9 % (ref 36.0–46.0)
Hemoglobin: 14 g/dL (ref 12.0–15.0)
MCH: 27.8 pg (ref 26.0–34.0)
MCHC: 32.6 g/dL (ref 30.0–36.0)
MCV: 85.3 fL (ref 78.0–100.0)
Platelets: 129 10*3/uL — ABNORMAL LOW (ref 150–400)
RBC: 5.03 MIL/uL (ref 3.87–5.11)
RDW: 14 % (ref 11.5–15.5)
WBC: 7.2 10*3/uL (ref 4.0–10.5)

## 2015-11-11 LAB — BASIC METABOLIC PANEL
Anion gap: 8 (ref 5–15)
BUN: 14 mg/dL (ref 6–20)
CO2: 27 mmol/L (ref 22–32)
Calcium: 8.8 mg/dL — ABNORMAL LOW (ref 8.9–10.3)
Chloride: 104 mmol/L (ref 101–111)
Creatinine, Ser: 0.74 mg/dL (ref 0.44–1.00)
GFR calc Af Amer: >60 mL/min (ref >60)
GFR calc non Af Amer: >60 mL/min (ref >60)
Glucose, Bld: 170 mg/dL — ABNORMAL HIGH (ref 65–99)
Potassium: 3.8 mmol/L (ref 3.5–5.1)
Sodium: 139 mmol/L (ref 135–145)

## 2015-11-11 IMAGING — DX DG CHEST 2V
2 series · 2 of 2 positions shown · non-contrast
Comparison: Chest x-ray of [DATE], and CT chest of [DATE]

CLINICAL DATA: Preop for thyroidectomy

EXAM:
CHEST  2 VIEW

[chest pa]
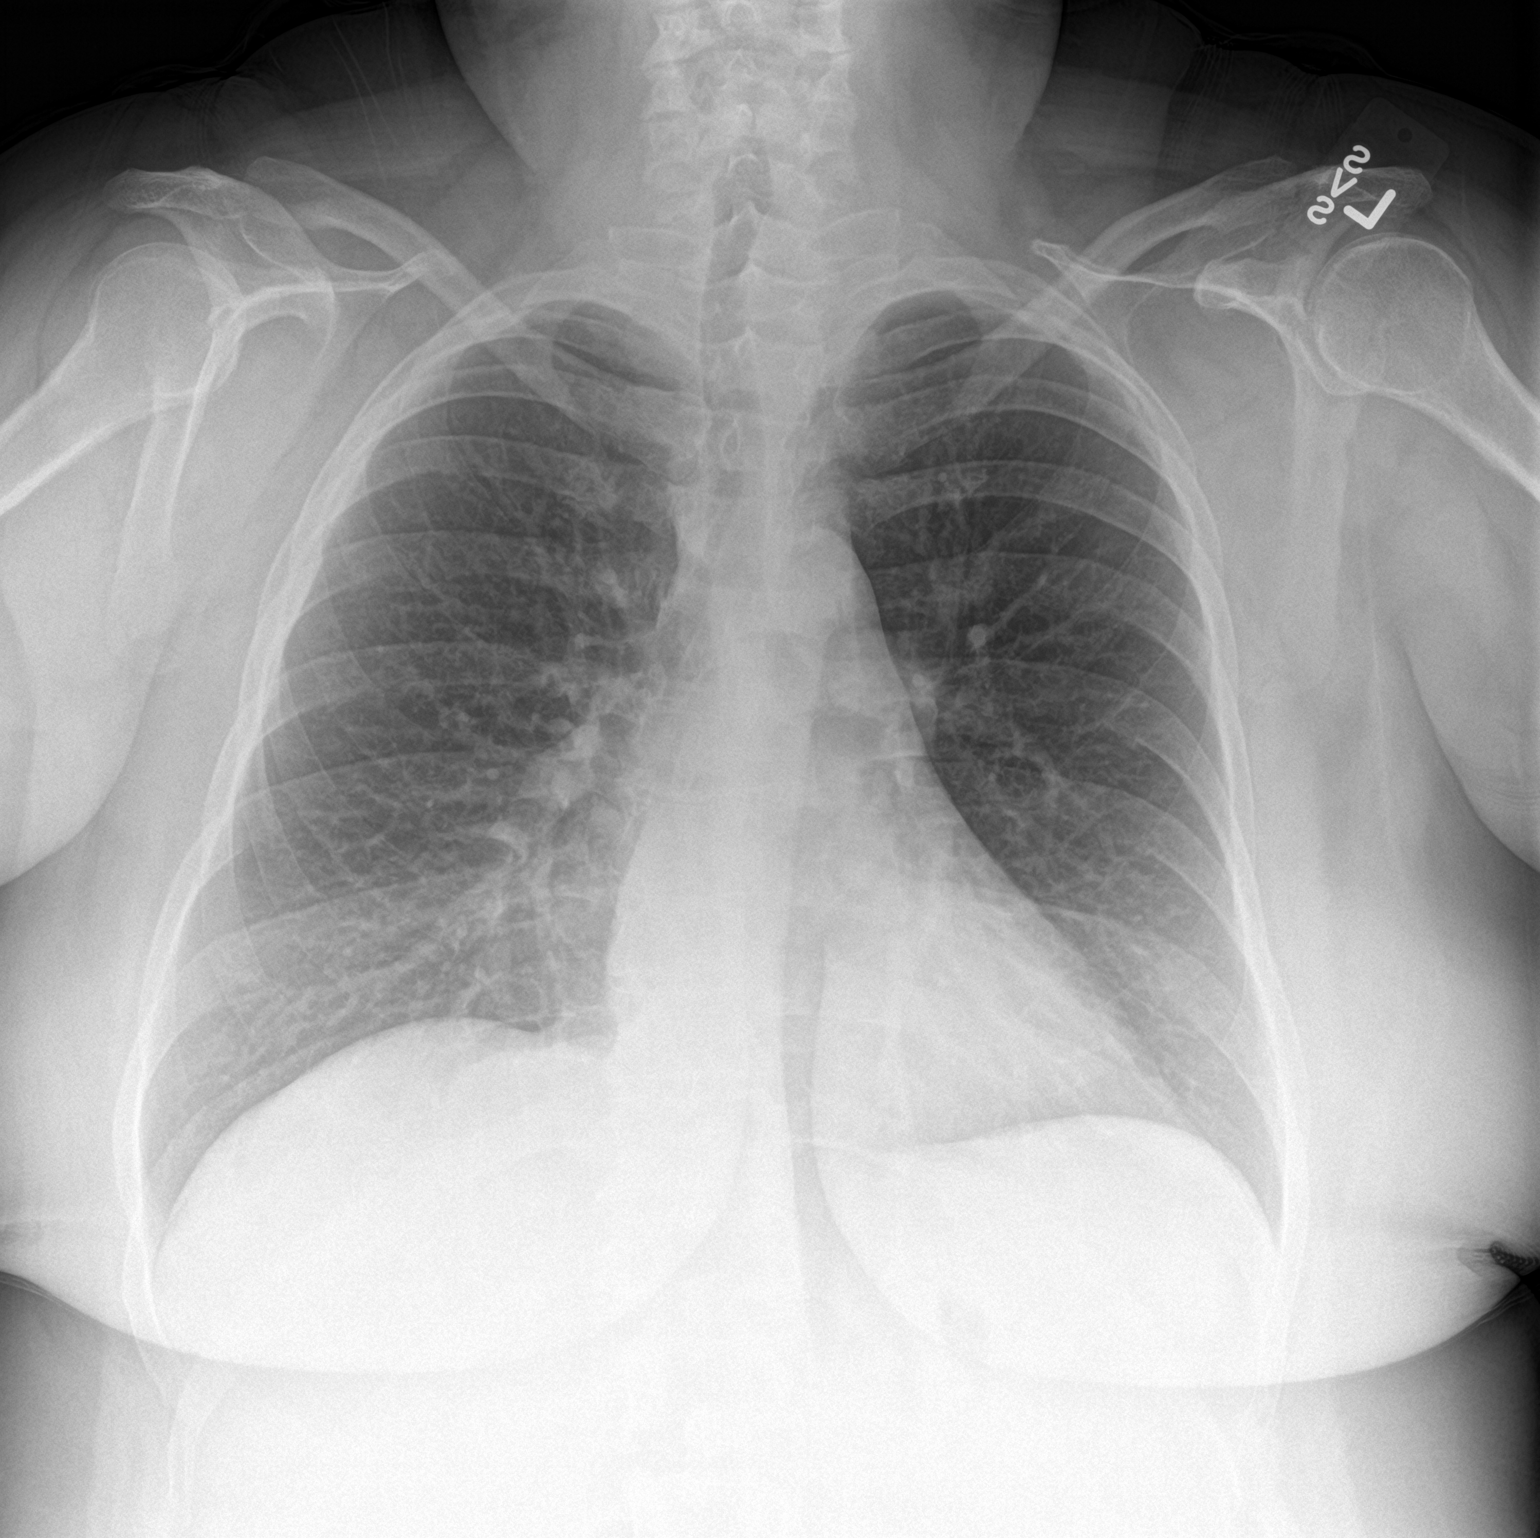

[chest lat]
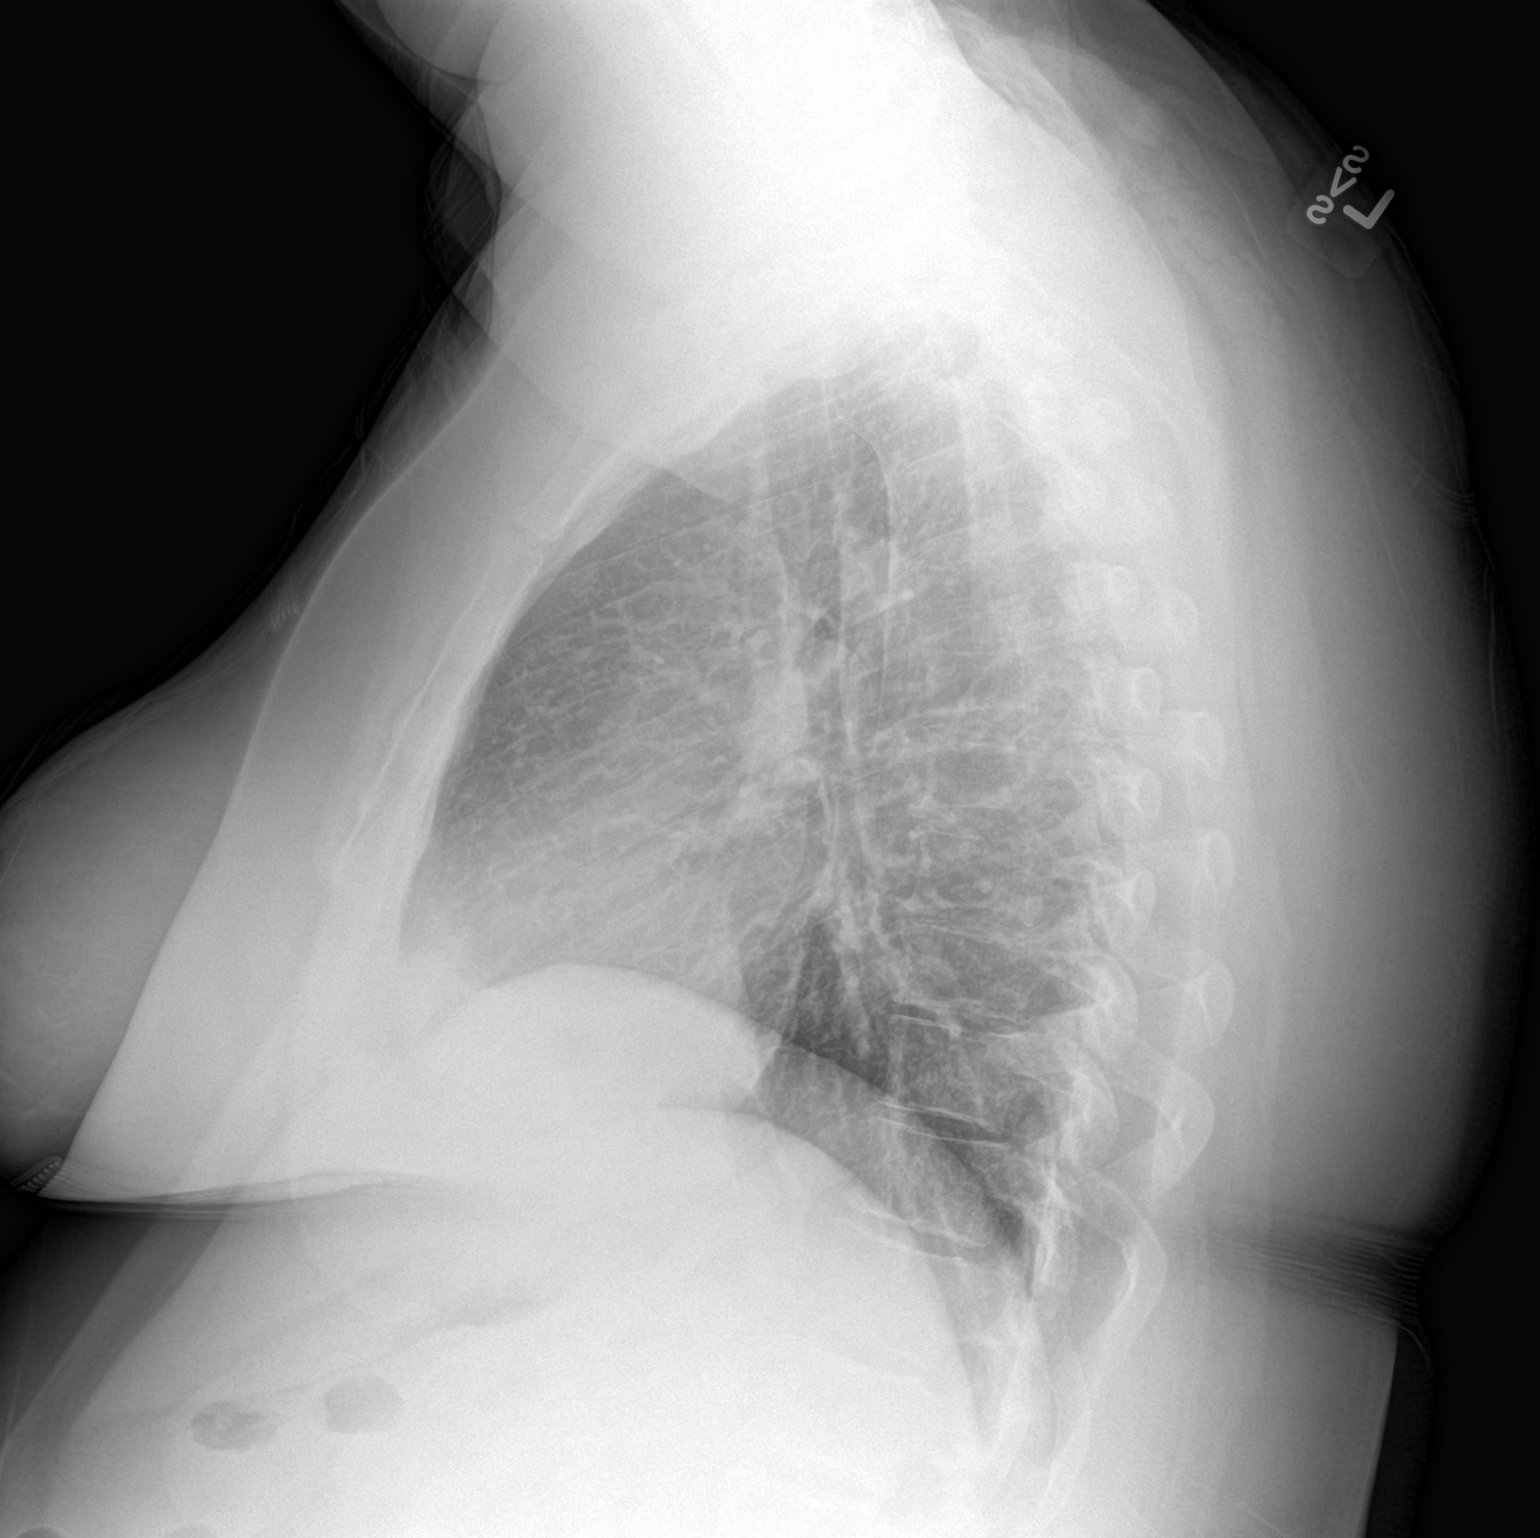

[2 of 2 positions shown; findings below may reference images not displayed]

FINDINGS: No active infiltrate or effusion is seen. Mediastinal and hilar
contours are unremarkable. Deviation of the superior tracheal air
shadow to the right is due to asymmetric enlargement of the thyroid
gland, left lobe larger than right when compared to the prior CT.
The heart is within normal limits in size. No bony abnormality is
seen.
IMPRESSION: No active cardiopulmonary disease. Deviation of the tracheal air
shadow superiorly due to asymmetric enlargement of the thyroid gland

## 2015-11-11 NOTE — Progress Notes (Signed)
Patient confirmed she is not taking coreg

## 2015-11-11 NOTE — Progress Notes (Signed)
Faxed cbc to dr gerkin via epic.  Chest x ray report viewed by Dr Stevphen Rochester- stated to fax to Dr Harlow Asa-  Done through epic

## 2015-11-12 LAB — HEMOGLOBIN A1C
Hgb A1c MFr Bld: 7.7 % — ABNORMAL HIGH (ref 4.8–5.6)
Mean Plasma Glucose: 174 mg/dL

## 2015-11-12 NOTE — Progress Notes (Signed)
lov dr Terrence Dupont 11/12/15 on chart

## 2015-11-17 ENCOUNTER — Encounter (HOSPITAL_COMMUNITY): Payer: Self-pay | Admitting: Surgery

## 2015-11-17 DIAGNOSIS — E042 Nontoxic multinodular goiter: Secondary | ICD-10-CM | POA: Diagnosis present

## 2015-11-17 NOTE — H&P (Signed)
General Surgery [facility]  Rhonda Higgins DOB: [birth year] Married / Language: English / Race: White Female  History of Present Illness   The patient is a 45 year old female who presents with a complaint of Enlarged thyroid.  Patient is referred by Dr. [name] for consultation regarding thyroid goiter with compressive symptoms. Patient had undergone CT scan of the chest abdomen and pelvis in December 2016. Incidental finding was made of a large thyroid goiter with tracheal compression. Patient developed a cough in March 2017 and chest x-ray was performed again showing tracheal deviation and compression by a large thyroid goiter. Thyroid function was normal with a TSH of 0.50. No further studies have been obtained. Patient is referred for evaluation of thyroid goiter with compressive symptoms. Patient has had no prior head or neck surgery. She has never been on thyroid medication. There is a family of hypothyroidism and the patient's sisters. There is no family history of thyroid cancer or other endocrine neoplasm. Patient notes moderate dysphagia and has had an upper endoscopy. She also notes shortness of breath with physical exertion. She denies tremors or palpitations.   Other Problems Anxiety Disorder Back Pain Bladder Problems Cancer Depression Diabetes Mellitus Gastroesophageal Reflux Disease Hemorrhoids Hypercholesterolemia Kidney Stone Sleep Apnea  Past Surgical History Hysterectomy (due to cancer) - Complete  Diagnostic Studies History Colonoscopy never Mammogram 1-3 years ago  Allergies Codeine Phosphate *ANALGESICS - OPIOID* HYDROcodone Bitartrate *CHEMICALS*  Medication History Cetirizine HCl (10MG  Tablet, Oral) Active. BuPROPion HCl ER (XL) (150MG  Tablet ER 24HR, Oral) Active. Glimepiride (4MG  Tablet, Oral) Active. Gabapentin (400MG  Capsule, Oral) Active. Janumet (50-1000MG  Tablet, Oral)  Active. Lisinopril (5MG  Tablet, Oral) Active. Omeprazole (40MG  Capsule DR, Oral) Active. Pantoprazole Sodium (20MG  Tablet DR, Oral) Active. Simvastatin (40MG  Tablet, Oral) Active. Pantoprazole Sodium (40MG  Tablet DR, Oral) Active. LamoTRIgine (200MG  Tablet, Oral) Active. RisperiDONE (1MG  Tablet, Oral) Active. Triamcinolone Acetonide (0.1% Cream, External) Active. Medications Reconciled  Social History Alcohol use Occasional alcohol use. Caffeine use Coffee, Tea. No drug use Tobacco use Never smoker.  Family History Cerebrovascular Accident Mother, Sister. Depression Mother, Sister. Diabetes Mellitus Mother. Heart Disease Brother, Mother, Sister. Heart disease in female family member before age 36 Hypertension Mother, Sister. Ischemic Bowel Disease Sister. Kidney Disease Sister. Prostate Cancer Father. Respiratory Condition Mother. Thyroid problems Sister.  Pregnancy / Birth History Age at menarche 54 years. Gravida 0 Irregular periods Para 0  Review of Systems General Not Present- Appetite Loss, Chills, Fatigue, Fever, Night Sweats, Weight Gain and Weight Loss. Skin Not Present- Change in Wart/Mole, Dryness, Hives, Jaundice, New Lesions, Non-Healing Wounds, Rash and Ulcer. HEENT Present- Seasonal Allergies and Wears glasses/contact lenses. Not Present- Earache, Hearing Loss, Hoarseness, Nose Bleed, Oral Ulcers, Ringing in the Ears, Sinus Pain, Sore Throat, Visual Disturbances and Yellow Eyes. Respiratory Present- Snoring. Not Present- Bloody sputum, Chronic Cough, Difficulty Breathing and Wheezing. Breast Not Present- Breast Mass, Breast Pain, Nipple Discharge and Skin Changes. Cardiovascular Not Present- Chest Pain, Difficulty Breathing Lying Down, Leg Cramps, Palpitations, Rapid Heart Rate, Shortness of Breath and Swelling of Extremities. Gastrointestinal Present- Hemorrhoids. Not Present- Abdominal Pain, Bloating, Bloody Stool, Change in Bowel  Habits, Chronic diarrhea, Constipation, Difficulty Swallowing, Excessive gas, Gets full quickly at meals, Indigestion, Nausea, Rectal Pain and Vomiting. Female Genitourinary Not Present- Frequency, Nocturia, Painful Urination, Pelvic Pain and Urgency. Musculoskeletal Not Present- Back Pain, Joint Pain, Joint Stiffness, Muscle Pain, Muscle Weakness and Swelling of Extremities. Neurological Not Present- Decreased Memory, Fainting, Headaches, Numbness, Seizures, Tingling, Tremor,  Trouble walking and Weakness. Psychiatric Present- Anxiety and Depression. Not Present- Bipolar, Change in Sleep Pattern, Fearful and Frequent crying. Endocrine Present- Hot flashes. Not Present- Cold Intolerance, Excessive Hunger, Hair Changes, Heat Intolerance and New Diabetes. Hematology Present- Easy Bruising. Not Present- Excessive bleeding, Gland problems, HIV and Persistent Infections.  Vitals Weight: 240 lb Height: 63in Body Surface Area: 2.09 m Body Mass Index: 42.51 kg/m  Temp.: 44F(Temporal)  Pulse: 81 (Regular)  BP: 126/76 (Sitting, Left Arm, Standard)  Physical Exam  General - appears comfortable, no distress; not diaphorectic  HEENT - normocephalic; sclerae clear, gaze conjugate; mucous membranes moist, dentition good; voice normal  Neck - asymmetric on extension; no palpable anterior or posterior cervical adenopathy; enlarged multinodular thyroid gland appearing slightly larger on the left than on the right, nontender  Chest - clear bilaterally without rhonchi, rales, or wheeze  Cor - regular rhythm with normal rate; no significant murmur  Ext - non-tender without significant edema or lymphedema  Neuro - grossly intact; no tremor  Assessment & Plan  THYROID GOITER (E04.9)  Pt Education - Pamphlet Given - The Thyroid Book: discussed with patient and provided information. 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 Patient will likely require thyroid surgery based on airway compression and dysphagia. Patient has not had a thyroid ultrasound. I would like to obtain this study for better information regarding the size and number and character of her thyroid nodules. Based on that study we will decide whether or not to proceed to surgery or undergo percutaneous biopsies.  I discussed total thyroidectomy at length with the patient. I provided them with written literature to review at home. We discussed total thyroidectomy and its complications including recurrent laryngeal nerve injury and injury to parathyroid glands. We discussed the hospital stay to be anticipated. We discussed the need for lifelong thyroid hormone replacement. They understand and agreed to proceed with further testing and possible surgery in the near future.  Patient will undergo thyroid ultrasound. I will contact her with the results and we will make a final decision regarding surgical intervention at that point in time.  ADDENDUM: Patient underwent thyroid USN followed by biopsy of two nodules.  Both were benign.  Now scheduled for thyroidectomy for relief of compressive symptoms from multinodular goiter.  The risks and benefits of the procedure have been discussed at length with the patient.  The patient understands the proposed procedure, potential alternative treatments, and the course of recovery to be expected.  All of the patient's questions have been answered at this time.  The patient wishes to proceed with surgery.  Earnstine Regal, MD, [facility] Office: [phone]

## 2015-11-19 ENCOUNTER — Ambulatory Visit (HOSPITAL_COMMUNITY): Payer: Medicaid Other | Admitting: Anesthesiology

## 2015-11-19 ENCOUNTER — Encounter (HOSPITAL_COMMUNITY)
Admission: RE | Disposition: A | Discharge status: Home / Self Care | Payer: Self-pay | Source: Ambulatory Visit | Attending: Surgery

## 2015-11-19 ENCOUNTER — Encounter (HOSPITAL_COMMUNITY): Payer: Self-pay | Admitting: *Deleted

## 2015-11-19 ENCOUNTER — Observation Stay (HOSPITAL_COMMUNITY)
Admission: RE | Admit: 2015-11-19 | Discharge: 2015-11-20 | Disposition: A | Discharge status: Home / Self Care | Payer: Medicaid Other | Source: Ambulatory Visit | Attending: Surgery | Admitting: Surgery

## 2015-11-19 DIAGNOSIS — Z7984 Long term (current) use of oral hypoglycemic drugs: Secondary | ICD-10-CM | POA: Diagnosis not present

## 2015-11-19 DIAGNOSIS — Z79899 Other long term (current) drug therapy: Secondary | ICD-10-CM | POA: Diagnosis not present

## 2015-11-19 DIAGNOSIS — E669 Obesity, unspecified: Secondary | ICD-10-CM | POA: Insufficient documentation

## 2015-11-19 DIAGNOSIS — K219 Gastro-esophageal reflux disease without esophagitis: Secondary | ICD-10-CM | POA: Insufficient documentation

## 2015-11-19 DIAGNOSIS — E7800 Pure hypercholesterolemia, unspecified: Secondary | ICD-10-CM | POA: Insufficient documentation

## 2015-11-19 DIAGNOSIS — E042 Nontoxic multinodular goiter: Principal | ICD-10-CM | POA: Diagnosis present

## 2015-11-19 DIAGNOSIS — G4730 Sleep apnea, unspecified: Secondary | ICD-10-CM | POA: Diagnosis not present

## 2015-11-19 DIAGNOSIS — F329 Major depressive disorder, single episode, unspecified: Secondary | ICD-10-CM | POA: Diagnosis not present

## 2015-11-19 DIAGNOSIS — E049 Nontoxic goiter, unspecified: Secondary | ICD-10-CM | POA: Diagnosis present

## 2015-11-19 DIAGNOSIS — Z6841 Body Mass Index (BMI) 40.0 and over, adult: Secondary | ICD-10-CM | POA: Insufficient documentation

## 2015-11-19 DIAGNOSIS — I10 Essential (primary) hypertension: Secondary | ICD-10-CM | POA: Diagnosis not present

## 2015-11-19 DIAGNOSIS — E119 Type 2 diabetes mellitus without complications: Secondary | ICD-10-CM | POA: Diagnosis not present

## 2015-11-19 HISTORY — PX: THYROIDECTOMY: SHX17

## 2015-11-19 LAB — GLUCOSE, CAPILLARY
Glucose-Capillary: 109 mg/dL — ABNORMAL HIGH (ref 65–99)
Glucose-Capillary: 151 mg/dL — ABNORMAL HIGH (ref 65–99)
Glucose-Capillary: 268 mg/dL — ABNORMAL HIGH (ref 65–99)
Glucose-Capillary: 268 mg/dL — ABNORMAL HIGH (ref 65–99)

## 2015-11-19 SURGERY — THYROIDECTOMY
Anesthesia: General | Site: Neck

## 2015-11-19 MED ORDER — GABAPENTIN 400 MG PO CAPS
400.0000 mg | ORAL_CAPSULE | Freq: Two times a day (BID) | ORAL | Status: DC
  Administered 2015-11-19: 400 mg via ORAL
  Filled 2015-11-19 (×3): qty 1

## 2015-11-19 MED ORDER — ACETAMINOPHEN 650 MG RE SUPP: 650.0000 mg | Freq: Four times a day (QID) | RECTAL | Status: DC | PRN

## 2015-11-19 MED ORDER — SUGAMMADEX SODIUM 200 MG/2ML IV SOLN
INTRAVENOUS | Status: AC
  Filled 2015-11-19: qty 2

## 2015-11-19 MED ORDER — GABAPENTIN 400 MG PO CAPS: 400.0000 mg | ORAL_CAPSULE | Freq: Three times a day (TID) | ORAL | Status: DC

## 2015-11-19 MED ORDER — LAMOTRIGINE 200 MG PO TABS
200.0000 mg | ORAL_TABLET | Freq: Every evening | ORAL | Status: DC
Start: 2015-11-19 — End: 2015-11-20
  Administered 2015-11-19: 200 mg via ORAL
  Filled 2015-11-19 (×2): qty 1

## 2015-11-19 MED ORDER — CEFAZOLIN SODIUM-DEXTROSE 2-4 GM/100ML-% IV SOLN
INTRAVENOUS | Status: AC
  Filled 2015-11-19: qty 100

## 2015-11-19 MED ORDER — 0.9 % SODIUM CHLORIDE (POUR BTL) OPTIME
TOPICAL | Status: DC | PRN
  Administered 2015-11-19: 1000 mL

## 2015-11-19 MED ORDER — CEFAZOLIN SODIUM-DEXTROSE 2-4 GM/100ML-% IV SOLN
2.0000 g | INTRAVENOUS | Status: AC
Start: 2015-11-19 — End: 2015-11-19
  Administered 2015-11-19: 2 g via INTRAVENOUS

## 2015-11-19 MED ORDER — MIDAZOLAM HCL 2 MG/2ML IJ SOLN
INTRAMUSCULAR | Status: AC
  Filled 2015-11-19: qty 2

## 2015-11-19 MED ORDER — HYDROMORPHONE HCL 1 MG/ML IJ SOLN
1.0000 mg | INTRAMUSCULAR | Status: DC | PRN
  Administered 2015-11-19 – 2015-11-20 (×5): 1 mg via INTRAVENOUS
  Filled 2015-11-19 (×5): qty 1

## 2015-11-19 MED ORDER — FENTANYL CITRATE (PF) 250 MCG/5ML IJ SOLN
INTRAMUSCULAR | Status: AC
  Filled 2015-11-19: qty 5

## 2015-11-19 MED ORDER — RISPERIDONE 2 MG PO TABS
2.0000 mg | ORAL_TABLET | Freq: Every day | ORAL | Status: DC
  Administered 2015-11-19: 2 mg via ORAL
  Filled 2015-11-19 (×2): qty 1

## 2015-11-19 MED ORDER — ROCURONIUM BROMIDE 100 MG/10ML IV SOLN
INTRAVENOUS | Status: AC
  Filled 2015-11-19: qty 1

## 2015-11-19 MED ORDER — RISPERIDONE 1 MG PO TABS: 1.0000 mg | ORAL_TABLET | ORAL | Status: DC

## 2015-11-19 MED ORDER — LACTATED RINGERS IV SOLN
INTRAVENOUS | Status: DC
  Administered 2015-11-19: 11:00:00 via INTRAVENOUS

## 2015-11-19 MED ORDER — PROPOFOL 10 MG/ML IV BOLUS
INTRAVENOUS | Status: AC
  Filled 2015-11-19: qty 20

## 2015-11-19 MED ORDER — HYDROMORPHONE HCL 1 MG/ML IJ SOLN
INTRAMUSCULAR | Status: AC
  Filled 2015-11-19: qty 1

## 2015-11-19 MED ORDER — ESMOLOL HCL 100 MG/10ML IV SOLN
INTRAVENOUS | Status: DC | PRN
  Administered 2015-11-19: 20 mg via INTRAVENOUS

## 2015-11-19 MED ORDER — BUPROPION HCL ER (XL) 150 MG PO TB24
150.0000 mg | ORAL_TABLET | Freq: Every day | ORAL | Status: DC
  Administered 2015-11-19: 150 mg via ORAL
  Filled 2015-11-19 (×2): qty 1

## 2015-11-19 MED ORDER — FENTANYL CITRATE (PF) 100 MCG/2ML IJ SOLN
INTRAMUSCULAR | Status: DC | PRN
  Administered 2015-11-19 (×4): 50 ug via INTRAVENOUS
  Administered 2015-11-19: 100 ug via INTRAVENOUS
  Administered 2015-11-19: 50 ug via INTRAVENOUS

## 2015-11-19 MED ORDER — PHENYLEPHRINE HCL 10 MG/ML IJ SOLN
INTRAMUSCULAR | Status: DC | PRN
  Administered 2015-11-19: 40 ug via INTRAVENOUS

## 2015-11-19 MED ORDER — ONDANSETRON HCL 4 MG/2ML IJ SOLN
4.0000 mg | Freq: Four times a day (QID) | INTRAMUSCULAR | Status: DC | PRN
  Administered 2015-11-19: 4 mg via INTRAVENOUS
  Filled 2015-11-19: qty 2

## 2015-11-19 MED ORDER — HYDROMORPHONE HCL 1 MG/ML IJ SOLN
0.2500 mg | INTRAMUSCULAR | Status: DC | PRN
  Administered 2015-11-19 (×4): 0.5 mg via INTRAVENOUS

## 2015-11-19 MED ORDER — ESMOLOL HCL 100 MG/10ML IV SOLN
INTRAVENOUS | Status: AC
  Filled 2015-11-19: qty 10

## 2015-11-19 MED ORDER — LEVOTHYROXINE SODIUM 100 MCG PO TABS
100.0000 ug | ORAL_TABLET | Freq: Every day | ORAL | Status: DC
  Administered 2015-11-20: 100 ug via ORAL
  Filled 2015-11-19 (×2): qty 1

## 2015-11-19 MED ORDER — MEPERIDINE HCL 50 MG/ML IJ SOLN: 6.2500 mg | INTRAMUSCULAR | Status: DC | PRN

## 2015-11-19 MED ORDER — CALCIUM CARBONATE 1250 (500 CA) MG PO TABS
2.0000 | ORAL_TABLET | Freq: Three times a day (TID) | ORAL | Status: DC
  Administered 2015-11-19 – 2015-11-20 (×2): 1000 mg via ORAL
  Filled 2015-11-19 (×5): qty 2

## 2015-11-19 MED ORDER — SUGAMMADEX SODIUM 200 MG/2ML IV SOLN
INTRAVENOUS | Status: DC | PRN
  Administered 2015-11-19: 200 mg via INTRAVENOUS

## 2015-11-19 MED ORDER — PANTOPRAZOLE SODIUM 40 MG PO TBEC
40.0000 mg | DELAYED_RELEASE_TABLET | Freq: Every day | ORAL | Status: DC
  Administered 2015-11-19: 40 mg via ORAL
  Filled 2015-11-19 (×2): qty 1

## 2015-11-19 MED ORDER — METFORMIN HCL 500 MG PO TABS
1000.0000 mg | ORAL_TABLET | Freq: Two times a day (BID) | ORAL | Status: DC
  Administered 2015-11-19 – 2015-11-20 (×2): 1000 mg via ORAL
  Filled 2015-11-19 (×4): qty 2

## 2015-11-19 MED ORDER — GABAPENTIN 400 MG PO CAPS
1200.0000 mg | ORAL_CAPSULE | Freq: Every day | ORAL | Status: DC
  Filled 2015-11-19 (×2): qty 3

## 2015-11-19 MED ORDER — ONDANSETRON 4 MG PO TBDP: 4.0000 mg | ORAL_TABLET | Freq: Four times a day (QID) | ORAL | Status: DC | PRN

## 2015-11-19 MED ORDER — RISPERIDONE 1 MG PO TABS
1.0000 mg | ORAL_TABLET | Freq: Every day | ORAL | Status: DC
  Filled 2015-11-19: qty 1

## 2015-11-19 MED ORDER — ACETAMINOPHEN 325 MG PO TABS: 650.0000 mg | ORAL_TABLET | Freq: Four times a day (QID) | ORAL | Status: DC | PRN

## 2015-11-19 MED ORDER — LINAGLIPTIN 5 MG PO TABS
5.0000 mg | ORAL_TABLET | Freq: Every day | ORAL | Status: DC
  Filled 2015-11-19: qty 1

## 2015-11-19 MED ORDER — ACETAMINOPHEN 10 MG/ML IV SOLN
INTRAVENOUS | Status: AC
  Filled 2015-11-19: qty 100

## 2015-11-19 MED ORDER — PROMETHAZINE HCL 25 MG/ML IJ SOLN: 6.2500 mg | INTRAMUSCULAR | Status: DC | PRN

## 2015-11-19 MED ORDER — ROCURONIUM BROMIDE 100 MG/10ML IV SOLN
INTRAVENOUS | Status: DC | PRN
  Administered 2015-11-19: 10 mg via INTRAVENOUS
  Administered 2015-11-19: 30 mg via INTRAVENOUS
  Administered 2015-11-19 (×2): 10 mg via INTRAVENOUS

## 2015-11-19 MED ORDER — ACETAMINOPHEN 10 MG/ML IV SOLN
1000.0000 mg | Freq: Once | INTRAVENOUS | Status: AC
  Administered 2015-11-19: 1000 mg via INTRAVENOUS

## 2015-11-19 MED ORDER — GLIMEPIRIDE 4 MG PO TABS
4.0000 mg | ORAL_TABLET | Freq: Two times a day (BID) | ORAL | Status: DC
  Administered 2015-11-19: 4 mg via ORAL
  Filled 2015-11-19 (×4): qty 1

## 2015-11-19 MED ORDER — SUCCINYLCHOLINE CHLORIDE 20 MG/ML IJ SOLN
INTRAMUSCULAR | Status: DC | PRN
  Administered 2015-11-19: 100 mg via INTRAVENOUS

## 2015-11-19 MED ORDER — KCL IN DEXTROSE-NACL 20-5-0.45 MEQ/L-%-% IV SOLN
INTRAVENOUS | Status: DC
  Administered 2015-11-19: 17:00:00 via INTRAVENOUS
  Filled 2015-11-19 (×2): qty 1000

## 2015-11-19 MED ORDER — SITAGLIPTIN PHOS-METFORMIN HCL 50-1000 MG PO TABS: 1.0000 | ORAL_TABLET | Freq: Two times a day (BID) | ORAL | Status: DC

## 2015-11-19 MED ORDER — PROPOFOL 10 MG/ML IV BOLUS
INTRAVENOUS | Status: DC | PRN
  Administered 2015-11-19: 150 mg via INTRAVENOUS

## 2015-11-19 MED ORDER — TRAMADOL HCL 50 MG PO TABS
50.0000 mg | ORAL_TABLET | Freq: Four times a day (QID) | ORAL | Status: DC | PRN
  Administered 2015-11-19 – 2015-11-20 (×3): 100 mg via ORAL
  Filled 2015-11-19 (×3): qty 2

## 2015-11-19 MED ORDER — LACTATED RINGERS IV SOLN: INTRAVENOUS | Status: DC

## 2015-11-19 MED ORDER — MIDAZOLAM HCL 5 MG/5ML IJ SOLN
INTRAMUSCULAR | Status: DC | PRN
  Administered 2015-11-19: 2 mg via INTRAVENOUS

## 2015-11-19 MED ORDER — INSULIN ASPART 100 UNIT/ML ~~LOC~~ SOLN
0.0000 [IU] | Freq: Three times a day (TID) | SUBCUTANEOUS | Status: DC
  Administered 2015-11-19: 8 [IU] via SUBCUTANEOUS
  Administered 2015-11-20: 3 [IU] via SUBCUTANEOUS

## 2015-11-19 MED ORDER — LISINOPRIL 5 MG PO TABS
5.0000 mg | ORAL_TABLET | Freq: Every day | ORAL | Status: DC
  Administered 2015-11-19: 5 mg via ORAL
  Filled 2015-11-19 (×2): qty 1

## 2015-11-19 MED ORDER — LIDOCAINE HCL (CARDIAC) 20 MG/ML IV SOLN
INTRAVENOUS | Status: DC | PRN
  Administered 2015-11-19: 100 mg via INTRAVENOUS

## 2015-11-19 SURGICAL SUPPLY — 38 items
APL SKNCLS STERI-STRIP NONHPOA (GAUZE/BANDAGES/DRESSINGS) ×1
ATTRACTOMAT 16X20 MAGNETIC DRP (DRAPES) ×2 IMPLANT
BENZOIN TINCTURE PRP APPL 2/3 (GAUZE/BANDAGES/DRESSINGS) ×1 IMPLANT
BLADE HEX COATED 2.75 (ELECTRODE) ×2 IMPLANT
BLADE SURG 15 STRL LF DISP TIS (BLADE) ×1 IMPLANT
BLADE SURG 15 STRL SS (BLADE) ×2
CHLORAPREP W/TINT 26ML (MISCELLANEOUS) ×2 IMPLANT
CLIP TI MEDIUM 6 (CLIP) ×4 IMPLANT
CLIP TI WIDE RED SMALL 6 (CLIP) ×4 IMPLANT
CLOSURE STERI-STRIP 1/4X4 (GAUZE/BANDAGES/DRESSINGS) ×2 IMPLANT
COVER SURGICAL LIGHT HANDLE (MISCELLANEOUS) ×1 IMPLANT
DISSECTOR ROUND CHERRY 3/8 STR (MISCELLANEOUS) IMPLANT
DRAPE LAPAROTOMY T 98X78 PEDS (DRAPES) ×2 IMPLANT
DRESSING SURGICEL FIBRLLR 1X2 (HEMOSTASIS) ×1 IMPLANT
DRSG SURGICEL FIBRILLAR 1X2 (HEMOSTASIS) ×2
ELECT PENCIL ROCKER SW 15FT (MISCELLANEOUS) ×2 IMPLANT
ELECT REM PT RETURN 9FT ADLT (ELECTROSURGICAL) ×2
ELECTRODE REM PT RTRN 9FT ADLT (ELECTROSURGICAL) ×1 IMPLANT
GAUZE SPONGE 4X4 12PLY STRL (GAUZE/BANDAGES/DRESSINGS) IMPLANT
GAUZE SPONGE 4X4 16PLY XRAY LF (GAUZE/BANDAGES/DRESSINGS) ×1 IMPLANT
GLOVE SURG ORTHO 8.0 STRL STRW (GLOVE) ×2 IMPLANT
GOWN STRL REUS W/TWL XL LVL3 (GOWN DISPOSABLE) ×4 IMPLANT
KIT BASIN OR (CUSTOM PROCEDURE TRAY) ×2 IMPLANT
LIQUID BAND (GAUZE/BANDAGES/DRESSINGS) ×1 IMPLANT
PACK BASIC VI WITH GOWN DISP (CUSTOM PROCEDURE TRAY) ×2 IMPLANT
SHEARS HARMONIC 9CM CVD (BLADE) ×2 IMPLANT
STAPLER VISISTAT 35W (STAPLE) IMPLANT
STRIP CLOSURE SKIN 1/2X4 (GAUZE/BANDAGES/DRESSINGS) ×2 IMPLANT
SUT MNCRL AB 4-0 PS2 18 (SUTURE) ×2 IMPLANT
SUT SILK 2 0 (SUTURE) ×2
SUT SILK 2-0 18XBRD TIE 12 (SUTURE) IMPLANT
SUT SILK 3 0 (SUTURE) ×2
SUT SILK 3-0 18XBRD TIE 12 (SUTURE) IMPLANT
SUT VIC AB 3-0 SH 18 (SUTURE) ×5 IMPLANT
SYR BULB IRRIGATION 50ML (SYRINGE) ×2 IMPLANT
TOWEL OR 17X26 10 PK STRL BLUE (TOWEL DISPOSABLE) ×2 IMPLANT
TOWEL OR NON WOVEN STRL DISP B (DISPOSABLE) ×2 IMPLANT
YANKAUER SUCT BULB TIP 10FT TU (MISCELLANEOUS) ×2 IMPLANT

## 2015-11-19 NOTE — Transfer of Care (Signed)
Immediate Anesthesia Transfer of Care Note  Patient: Rhonda Higgins  Procedure(s) Performed: Procedure(s): TOTAL THYROIDECTOMY (N/A)  Patient Location: PACU  Anesthesia Type:General  Level of Consciousness:  sedated, patient cooperative and responds to stimulation  Airway & Oxygen Therapy:Patient Spontanous Breathing and Patient connected to face mask oxgen  Post-op Assessment:  Report given to PACU RN and Post -op Vital signs reviewed and stable  Post vital signs:  Reviewed and stable  Last Vitals:  Filed Vitals:   11/19/15 0921  BP: 152/91  Pulse: 98  Temp: 36.8 C  Resp: 18    Complications: No apparent anesthesia complications

## 2015-11-19 NOTE — Anesthesia Postprocedure Evaluation (Signed)
Anesthesia Post Note  Patient: Rhonda Higgins  Procedure(s) Performed: Procedure(s) (LRB): TOTAL THYROIDECTOMY (N/A)  Patient location during evaluation: PACU Anesthesia Type: General Level of consciousness: awake and alert Pain management: pain level controlled Vital Signs Assessment: post-procedure vital signs reviewed and stable Respiratory status: spontaneous breathing, nonlabored ventilation, respiratory function stable and patient connected to nasal cannula oxygen Cardiovascular status: blood pressure returned to baseline and stable Postop Assessment: no signs of nausea or vomiting Anesthetic complications: no    Last Vitals:  Filed Vitals:   11/19/15 1445 11/19/15 1508  BP: 157/86 136/86  Pulse: 110 113  Temp: 37 C 37.6 C  Resp: 17     Last Pain:  Filed Vitals:   11/19/15 1511  PainSc: 0-No pain                 Effie Berkshire

## 2015-11-19 NOTE — Op Note (Signed)
Procedure Note  Pre-operative Diagnosis:  Multinodular thyroid goiter with compressive symptoms  Post-operative Diagnosis:  same  Surgeon:  Earnstine Regal, MD, FACS  Assistant:  Alphonsa Overall, MD, FACS   Procedure:  Total thyroidectomy  Anesthesia:  General  Estimated Blood Loss:  minimal  Drains: none         Specimen: thyroid to pathology  Indications:  Patient is referred by Dr. [name] for consultation regarding thyroid goiter with compressive symptoms. Patient had undergone CT scan of the chest abdomen and pelvis in December 2016. Incidental finding was made of a large thyroid goiter with tracheal compression. Patient developed a cough in March 2017 and chest x-ray was performed again showing tracheal deviation and compression by a large thyroid goiter. Thyroid function was normal with a TSH of 0.50. No further studies have been obtained. Patient is referred for evaluation of thyroid goiter with compressive symptoms. USN shows two dominant nodules which were biopsied pre-op with benign cytopathology.  Patient now comes to surgery for total thyroidectomy.  Procedure Details: Procedure was done in OR #6 at the [facility].  The patient was brought to the operating room and placed in a supine position on the operating room table.  Following administration of general anesthesia, the patient was positioned and then prepped and draped in the usual aseptic fashion.  After ascertaining that an adequate level of anesthesia had been achieved, a Kocher incision was made with #15 blade.  Dissection was carried through subcutaneous tissues and platysma. Hemostasis was achieved with the electrocautery.  Skin flaps were elevated cephalad and caudad from the thyroid notch to the sternal notch.  The [place]ner self-retaining retractor was placed for exposure.  Strap muscles were incised in the midline and dissection was begun on the left side.  Strap muscles were reflected laterally.  Left  thyroid lobe was markedly enlarged and multinodular.  The left lobe was gently mobilized with blunt dissection.  Superior pole vessels were dissected out and divided individually between small and medium Ligaclips with the Harmonic scalpel.  The thyroid lobe was rolled anteriorly.  Branches of the inferior thyroid artery were divided between small Ligaclips with the Harmonic scalpel.  Inferior venous tributaries were divided between Ligaclips.  Both the superior and inferior parathyroid glands were identified and preserved on their vascular pedicles.  The recurrent laryngeal nerve was identified and preserved along its course.  The ligament of Gwenlyn Found was released with the electrocautery and the gland was mobilized onto the anterior trachea. Isthmus was mobilized across the midline.  There was a large pyramidal lobe present which dissected off of the thyroid cartilage and resected with the right thyroid lobe. Isthmus was divided and the left lobe submitted to pathology.  Dry pack was placed in the left neck.  Next, the right thyroid lobe was gently mobilized with blunt dissection.  Right thyroid lobe was multinodular and enlarged, but smaller than the left lobe.  Superior pole vessels were dissected out and divided between small and medium Ligaclips with the Harmonic scalpel.  Superior parathyroid was identified and preserved.  Inferior venous tributaries were divided between medium Ligaclips with the Harmonic scalpel.  The right thyroid lobe was rolled anteriorly and the branches of the inferior thyroid artery divided between small Ligaclips.  The right recurrent laryngeal nerve was identified and preserved along its course.  The ligament of Gwenlyn Found was released with the electrocautery.  The right thyroid lobe was mobilized onto the anterior trachea and the remainder of the  thyroid was dissected off the anterior trachea and the thyroid was completely excised. The right thyroid lobe and isthmus was submitted to  pathology for review.  The neck was irrigated with warm saline.  Fibrillar was placed throughout the operative field.  Strap muscles were reapproximated in the midline with interrupted 3-0 Vicryl sutures.  Platysma was closed with interrupted 3-0 Vicryl sutures.  Skin was closed with a running 4-0 Monocryl subcuticular suture.  Wound was washed and dried and benzoin and steri-strips were applied.  Dry gauze dressing was placed.  The patient was awakened from anesthesia and brought to the recovery room.  The patient tolerated the procedure well.   Earnstine Regal, MD, [facility] Office: [phone]

## 2015-11-19 NOTE — Anesthesia Preprocedure Evaluation (Addendum)
Anesthesia Evaluation  Patient identified by MRN, date of birth, ID band Patient awake    Reviewed: Allergy & Precautions, NPO status , Patient's Chart, lab work & pertinent test results  Airway Mallampati: IV   Neck ROM: Full  Mouth opening: Limited Mouth Opening  Dental  (+) Dental Advisory Given, Missing   Pulmonary sleep apnea ,    breath sounds clear to auscultation       Cardiovascular hypertension, Pt. on medications  Rhythm:Regular Rate:Normal     Neuro/Psych PSYCHIATRIC DISORDERS Depression  Neuromuscular disease    GI/Hepatic GERD  Medicated,  Endo/Other  diabetes, Type 2, Oral Hypoglycemic Agents  Renal/GU Renal disease  negative genitourinary   Musculoskeletal negative musculoskeletal ROS (+)   Abdominal (+) + obese,   Peds negative pediatric ROS (+)  Hematology   Anesthesia Other Findings   Reproductive/Obstetrics negative OB ROS                          Anesthesia Physical Anesthesia Plan  ASA: III  Anesthesia Plan: General   Post-op Pain Management:    Induction: Intravenous  Airway Management Planned: Oral ETT and Video Laryngoscope Planned  Additional Equipment:   Intra-op Plan:   Post-operative Plan: Extubation in OR  Informed Consent: I have reviewed the patients History and Physical, chart, labs and discussed the procedure including the risks, benefits and alternatives for the proposed anesthesia with the patient or authorized representative who has indicated his/her understanding and acceptance.   Dental advisory given  Plan Discussed with: CRNA  Anesthesia Plan Comments: (Tracheal deviation and reduced diameter noted on CT. )       Anesthesia Quick Evaluation

## 2015-11-19 NOTE — Anesthesia Procedure Notes (Signed)
Procedure Name: Intubation Date/Time: 11/19/2015 11:56 AM Performed by: Anne Fu Pre-anesthesia Checklist: Patient identified, Emergency Drugs available, Suction available, Patient being monitored and Timeout performed Patient Re-evaluated:Patient Re-evaluated prior to inductionOxygen Delivery Method: Circle system utilized Preoxygenation: Pre-oxygenation with 100% oxygen Intubation Type: IV induction Ventilation: Mask ventilation without difficulty Laryngoscope Size: Mac and 3 Grade View: Grade I Tube type: Oral Tube size: 7.5 mm Number of attempts: 1 Airway Equipment and Method: Video-laryngoscopy Placement Confirmation: ETT inserted through vocal cords under direct vision,  positive ETCO2,  CO2 detector and breath sounds checked- equal and bilateral Secured at: 21 cm Tube secured with: Tape Dental Injury: Teeth and Oropharynx as per pre-operative assessment

## 2015-11-19 NOTE — Interval H&P Note (Signed)
History and Physical Interval Note:  11/19/2015 11:40 AM  Rhonda Higgins  has presented today for surgery, with the diagnosis of Multinodular thyroid goiter with compressive symptoms.  The various methods of treatment have been discussed with the patient and family. After consideration of risks, benefits and other options for treatment, the patient has consented to    Procedure(s): TOTAL THYROIDECTOMY (N/A) as a surgical intervention .    The patient's history has been reviewed, patient examined, no change in status, stable for surgery.  I have reviewed the patient's chart and labs.  Questions were answered to the patient's satisfaction.    Earnstine Regal, MD, [facility] Office: [place]

## 2015-11-20 DIAGNOSIS — E042 Nontoxic multinodular goiter: Secondary | ICD-10-CM | POA: Diagnosis not present

## 2015-11-20 LAB — BASIC METABOLIC PANEL
Anion gap: 8 (ref 5–15)
BUN: 6 mg/dL (ref 6–20)
CO2: 31 mmol/L (ref 22–32)
Calcium: 8 mg/dL — ABNORMAL LOW (ref 8.9–10.3)
Chloride: 95 mmol/L — ABNORMAL LOW (ref 101–111)
Creatinine, Ser: 0.69 mg/dL (ref 0.44–1.00)
GFR calc Af Amer: >60 mL/min (ref >60)
GFR calc non Af Amer: >60 mL/min (ref >60)
Glucose, Bld: 200 mg/dL — ABNORMAL HIGH (ref 65–99)
Potassium: 4 mmol/L (ref 3.5–5.1)
Sodium: 134 mmol/L — ABNORMAL LOW (ref 135–145)

## 2015-11-20 LAB — GLUCOSE, CAPILLARY
Glucose-Capillary: 168 mg/dL — ABNORMAL HIGH (ref 65–99)
Glucose-Capillary: 189 mg/dL — ABNORMAL HIGH (ref 65–99)

## 2015-11-20 MED ORDER — CALCIUM CARBONATE 1250 (500 CA) MG PO TABS
2.0000 | ORAL_TABLET | Freq: Four times a day (QID) | ORAL | Status: DC
Start: 2015-11-20 — End: 2017-08-21

## 2015-11-20 MED ORDER — TRAMADOL HCL 50 MG PO TABS: 50.0000 mg | ORAL_TABLET | Freq: Four times a day (QID) | ORAL | Status: DC | PRN

## 2015-11-20 MED ORDER — SODIUM CHLORIDE 0.9 % IV SOLN
2.0000 g | INTRAVENOUS | Status: AC
  Administered 2015-11-20: 2 g via INTRAVENOUS
  Filled 2015-11-20: qty 20

## 2015-11-20 MED ORDER — SYNTHROID 100 MCG PO TABS: 100.0000 ug | ORAL_TABLET | Freq: Every day | ORAL | Status: DC

## 2015-11-20 NOTE — Discharge Summary (Signed)
Physician Discharge Summary [facility]  Patient ID: Rhonda Higgins MRN: [id number] DOB/AGE: Nov 02, 1970 45 y.o.  Admit date: 11/19/2015 Discharge date: 11/20/2015  Admission Diagnoses:  Multinodular goiter with compressive symptoms  Discharge Diagnoses:  Principal Problem:   Multinodular goiter (nontoxic)   Discharged Condition: good  Hospital Course: Patient was admitted for observation following thryoid surgery.  Post op course was uncomplicated.  Pain was well controlled.  Tolerated diet.  Post op calcium level on morning following surgery was 8.0 mg/dl.  IV Calcium gluconate given prior to discharge.  Patient was prepared for discharge home on POD#1.  Consults: None  Treatments: surgery: total thyroidectomy  Discharge Exam: Blood pressure 140/83, pulse 104, temperature 98.2 F (36.8 C), temperature source Oral, resp. rate 16, height 5\' 3"  (1.6 m), weight 107.502 kg (237 lb), SpO2 98 %. HEENT - clear Neck - wound dry and intact; mild STS; mild hoarseness Chest - clear bilaterally Cor - RRR  Disposition: Home  Discharge Instructions    Diet - low sodium heart healthy    Complete by:  As directed      Increase activity slowly    Complete by:  As directed      Remove dressing in 24 hours    Complete by:  As directed             Medication List    TAKE these medications        aspirin 81 MG tablet  Take 162 mg by mouth every evening.     buPROPion 150 MG 24 hr tablet  Commonly known as:  WELLBUTRIN XL  Take 150 mg by mouth at bedtime.     calcium carbonate 1250 (500 Ca) MG tablet  Commonly known as:  OS-CAL - dosed in mg of elemental calcium  Take 2 tablets (1,000 mg of elemental calcium total) by mouth 4 (four) times daily.     carvedilol 3.125 MG tablet  Commonly known as:  COREG  Take 1 tablet (3.125 mg total) by mouth 2 (two) times daily with a meal.     cetirizine 10 MG tablet  Commonly known as:  ZYRTEC  Take 10 mg by mouth  at bedtime.     gabapentin 400 MG capsule  Commonly known as:  NEURONTIN  Take 400-1,200 mg by mouth 3 (three) times daily. Take one capsule in the mornning and afternoon and three capsules at bedtime.     glimepiride 4 MG tablet  Commonly known as:  AMARYL  Take 4 mg by mouth 2 (two) times daily.     ibuprofen 800 MG tablet  Commonly known as:  ADVIL,MOTRIN  Take 800 mg by mouth every 8 (eight) hours as needed for moderate pain.     lamoTRIgine 200 MG tablet  Commonly known as:  LAMICTAL  Take 200 mg by mouth every evening.     lisinopril 5 MG tablet  Commonly known as:  PRINIVIL,ZESTRIL  Take 5 mg by mouth daily.     multivitamin with minerals Tabs tablet  Take 1 tablet by mouth daily.     pantoprazole 40 MG tablet  Commonly known as:  PROTONIX  Take 40 mg by mouth at bedtime.     risperiDONE 1 MG tablet  Commonly known as:  RISPERDAL  Take 1-2 mg by mouth as directed. Take 1 tablet (1 mg) in the morning and Take 2 tablets (2 mg) at bedtime.     simvastatin 40 MG tablet  Commonly known  as:  ZOCOR  Take 40 mg by mouth every evening.     sitaGLIPtin-metformin 50-1000 MG tablet  Commonly known as:  JANUMET  Take 1 tablet by mouth 2 (two) times daily with a meal.     SYNTHROID 100 MCG tablet  Generic drug:  levothyroxine  Take 1 tablet (100 mcg total) by mouth daily.     terbinafine 250 MG tablet  Commonly known as:  LAMISIL  Take 250 mg by mouth daily.     traMADol 50 MG tablet  Commonly known as:  ULTRAM  Take 1-2 tablets (50-100 mg total) by mouth every 6 (six) hours as needed for moderate pain.           Follow-up Information    Follow up with Earnstine Regal, MD. Schedule an appointment as soon as possible for a visit in 3 weeks.   Specialty:  General Surgery   Why:  For wound re-check   Contact information:   [address] [id number] [phone]       Earnstine Regal, MD, [facility] Office:  [phone]   Signed: Earnstine Regal 11/20/2015, 7:39 AM

## 2015-11-20 NOTE — Progress Notes (Signed)
Patient alert and oriented with pain controlled. Patient given discharge instructions and prescriptions. All questions and concerns answered. Patient verbalized understanding of all instructions.

## 2015-11-23 NOTE — Progress Notes (Signed)
Quick Note:  Please contact patient and notify of benign pathology results.  Kalliope Riesen M. Arielle Eber, MD, FACS [facility] Office: [phone]   ______ 

## 2015-11-30 ENCOUNTER — Ambulatory Visit: Payer: Medicaid Other | Admitting: Pulmonary Disease

## 2016-02-13 ENCOUNTER — Encounter (HOSPITAL_COMMUNITY): Payer: Self-pay | Admitting: Emergency Medicine

## 2016-02-13 ENCOUNTER — Emergency Department (HOSPITAL_COMMUNITY): Payer: Medicaid Other

## 2016-02-13 ENCOUNTER — Emergency Department (HOSPITAL_COMMUNITY)
Admission: EM | Admit: 2016-02-13 | Discharge: 2016-02-14 | Disposition: A | Discharge status: Home / Self Care | Payer: Medicaid Other | Attending: Emergency Medicine | Admitting: Emergency Medicine

## 2016-02-13 DIAGNOSIS — Z7984 Long term (current) use of oral hypoglycemic drugs: Secondary | ICD-10-CM | POA: Diagnosis not present

## 2016-02-13 DIAGNOSIS — R109 Unspecified abdominal pain: Secondary | ICD-10-CM | POA: Diagnosis present

## 2016-02-13 DIAGNOSIS — Z8541 Personal history of malignant neoplasm of cervix uteri: Secondary | ICD-10-CM | POA: Insufficient documentation

## 2016-02-13 DIAGNOSIS — E119 Type 2 diabetes mellitus without complications: Secondary | ICD-10-CM | POA: Diagnosis not present

## 2016-02-13 DIAGNOSIS — Z79899 Other long term (current) drug therapy: Secondary | ICD-10-CM | POA: Diagnosis not present

## 2016-02-13 DIAGNOSIS — Z791 Long term (current) use of non-steroidal anti-inflammatories (NSAID): Secondary | ICD-10-CM | POA: Diagnosis not present

## 2016-02-13 DIAGNOSIS — N200 Calculus of kidney: Secondary | ICD-10-CM | POA: Diagnosis not present

## 2016-02-13 LAB — URINALYSIS, ROUTINE W REFLEX MICROSCOPIC
Bilirubin Urine: NEGATIVE
Glucose, UA: 100 mg/dL — AB
Ketones, ur: NEGATIVE mg/dL
Leukocytes, UA: NEGATIVE
Nitrite: NEGATIVE
Protein, ur: NEGATIVE mg/dL
Specific Gravity, Urine: 1.026 (ref 1.005–1.030)
pH: 6 (ref 5.0–8.0)

## 2016-02-13 LAB — URINE MICROSCOPIC-ADD ON: WBC, UA: NONE SEEN WBC/hpf (ref 0–5)

## 2016-02-13 IMAGING — CT CT RENAL STONE PROTOCOL
2 of 3 series · 17 of 46 positions shown, 19 images · non-contrast
Comparison: [DATE]

CLINICAL DATA: Severe left flank pain starting today at noon and
radiating to the left lower quadrant.

EXAM:
CT ABDOMEN AND PELVIS WITHOUT CONTRAST
TECHNIQUE: Multidetector CT imaging of the abdomen and pelvis was performed
following the standard protocol without IV contrast.

[Series 3: coronal · coronal · 0.92mm/px · 3 of 179 slices shown]
[im 60/179  soft-tissue]
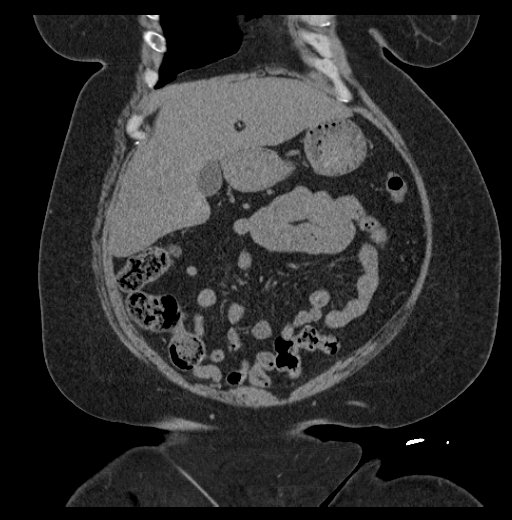
[im 80/179  soft-tissue]
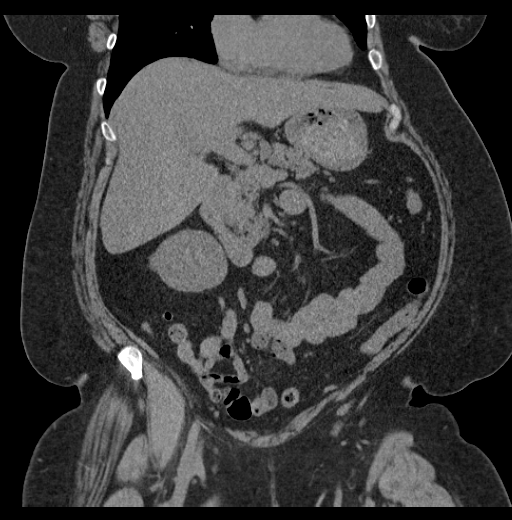
[im 99/179  soft-tissue]
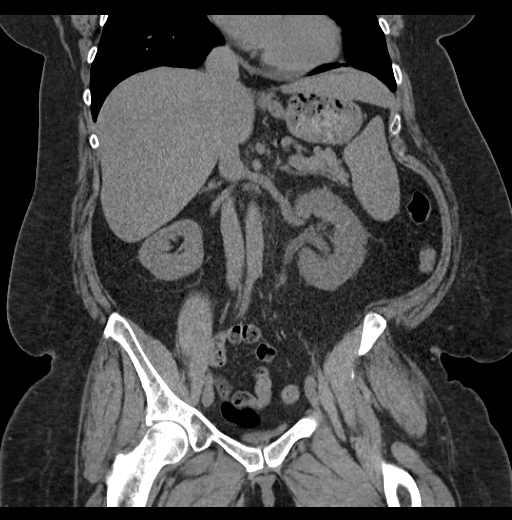

[Series 6: lung · axial · 0.88mm/px · z∈[-304,-170]mm · 14 of 79 slices shown, 16 images]
[im 6/79  soft-tissue]
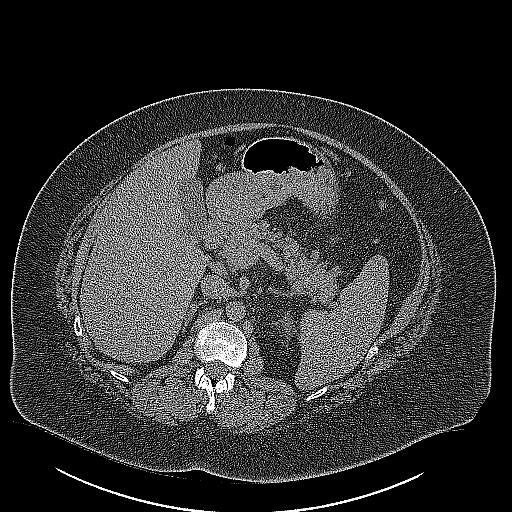
[im 6/79  bone]
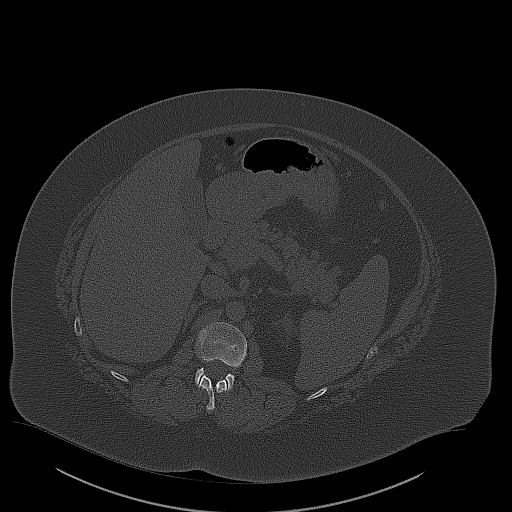
[im 11/79  soft-tissue]
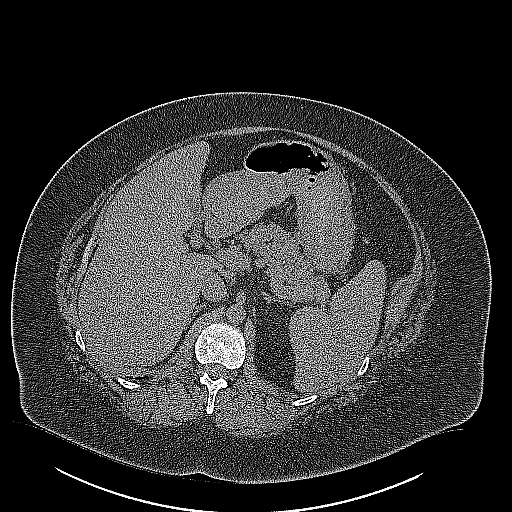
[im 16/79  soft-tissue]
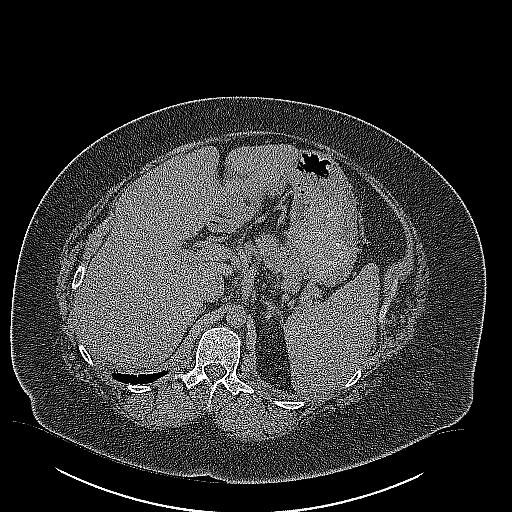
[im 21/79  soft-tissue]
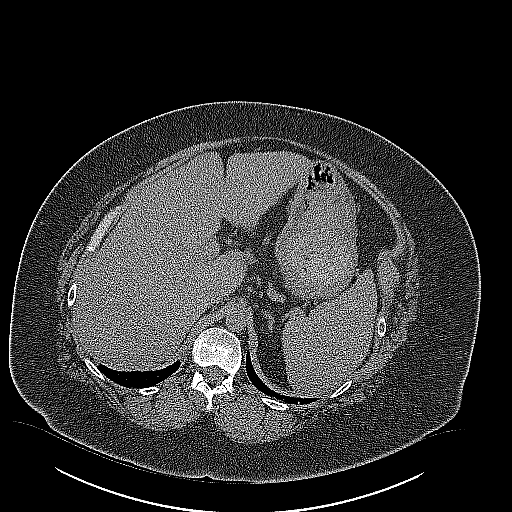
[im 26/79  soft-tissue]
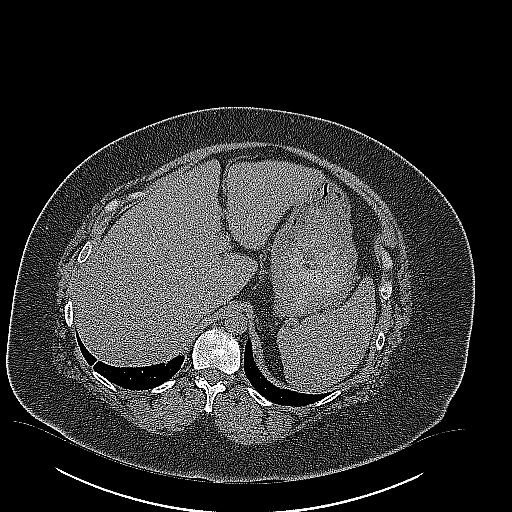
[im 31/79  soft-tissue]
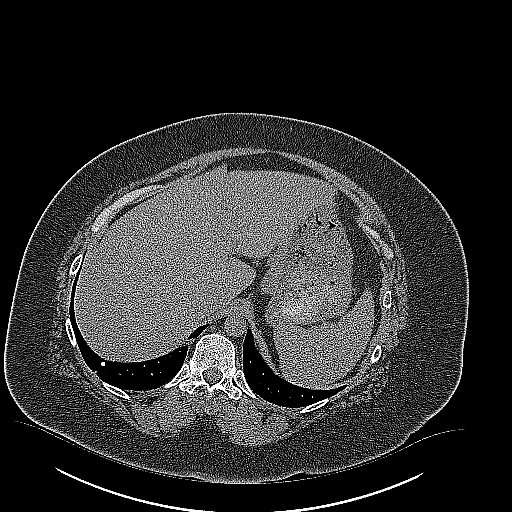
[im 36/79  soft-tissue]
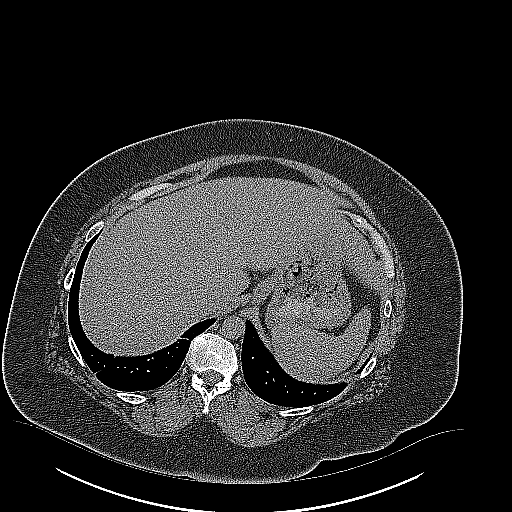
[im 43/79  soft-tissue]
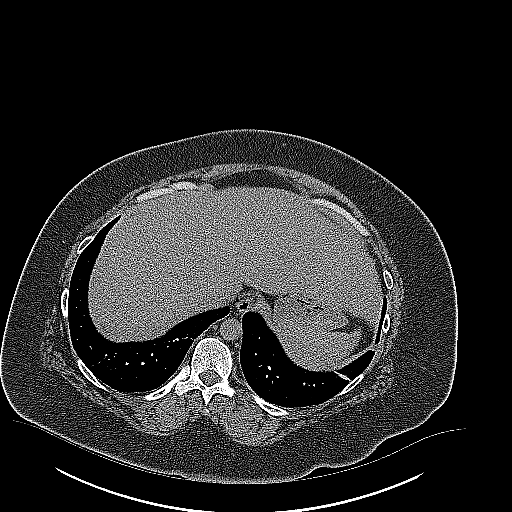
[im 48/79  soft-tissue]
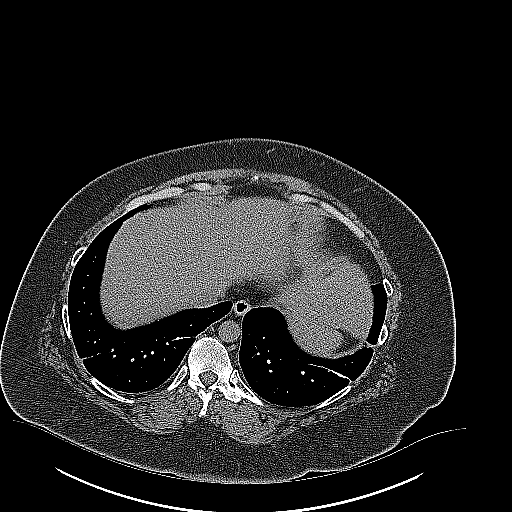
[im 48/79  bone]
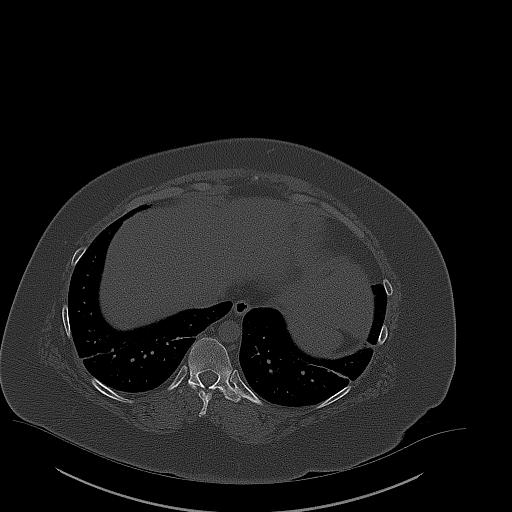
[im 53/79  soft-tissue]
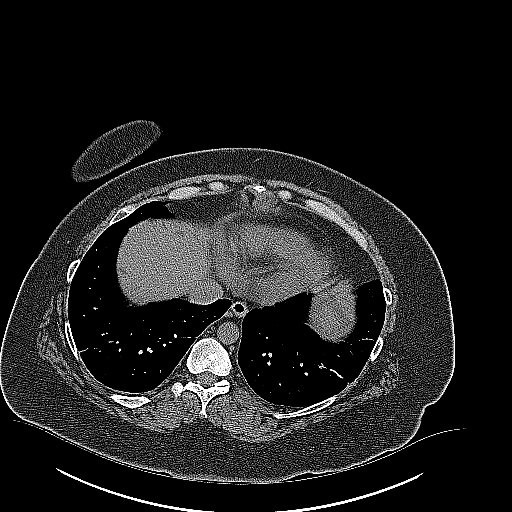
[im 58/79  soft-tissue]
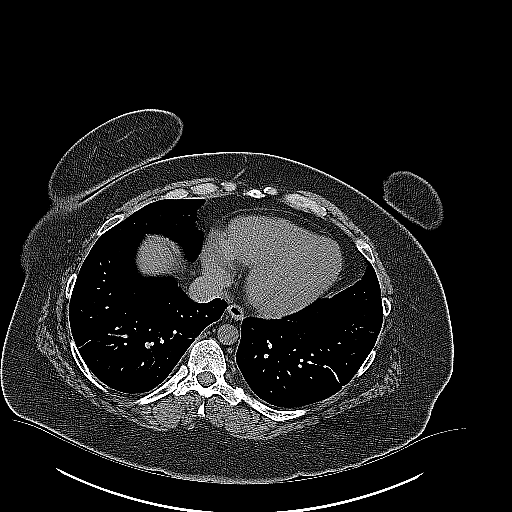
[im 63/79  soft-tissue]
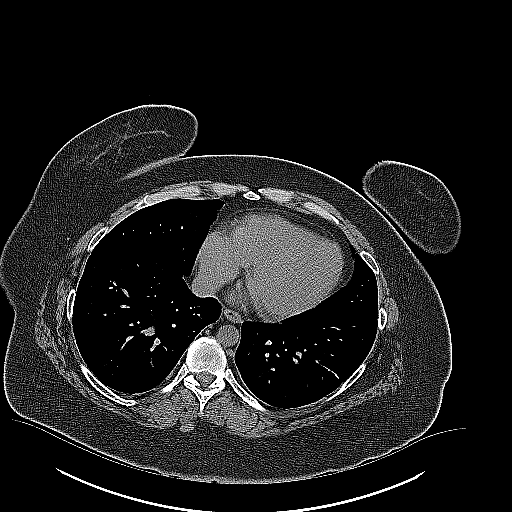
[im 68/79  soft-tissue]
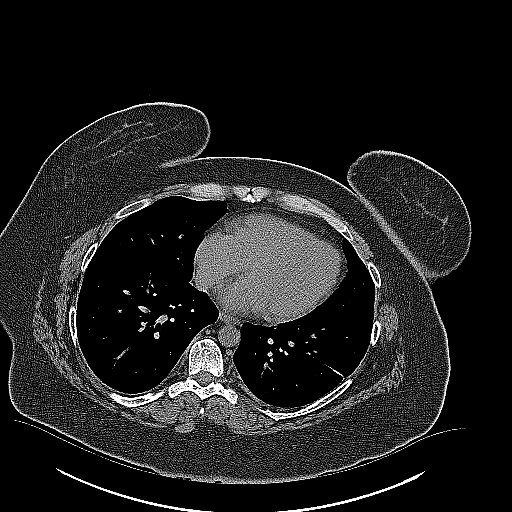
[im 73/79  soft-tissue]
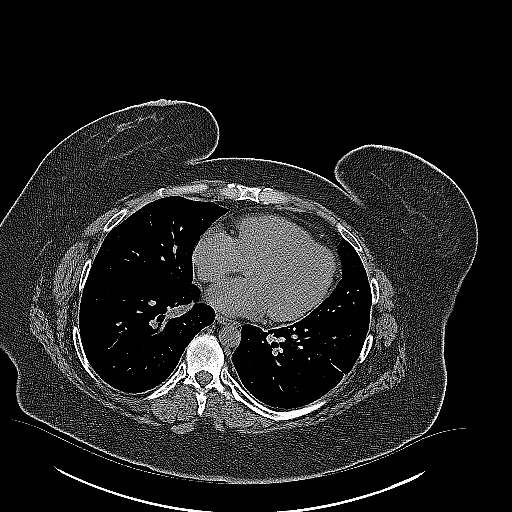

[17 of 46 positions shown; findings below may reference images not displayed]

FINDINGS: Lung bases are clear.

5 mm stone demonstrated in the left ureteropelvic junction with mild
proximal hydronephrosis and with stranding around the left kidney
and renal pelvis. The distal ureter is decompressed. Right kidney in
ureter are unremarkable. Bladder wall is not thickened and no
bladder stones are demonstrated.

The unenhanced appearance of the liver, spleen, gallbladder,
pancreas, adrenal glands, abdominal aorta, inferior vena cava, and
retroperitoneal lymph nodes are unremarkable. Stomach, small bowel,
and colon are not abnormally distended. No free air or free fluid in
the abdomen.

Pelvis: Appendix is normal. Uterus is surgically absent. No pelvic
mass or lymphadenopathy. No free or loculated pelvic fluid
collections. No destructive bone lesions.
IMPRESSION: 5 mm stone in the left ureteropelvic junction with moderate proximal
obstruction.

## 2016-02-13 MED ORDER — HYDROMORPHONE HCL 1 MG/ML IJ SOLN
1.0000 mg | Freq: Once | INTRAMUSCULAR | Status: AC
  Administered 2016-02-13: 1 mg via INTRAMUSCULAR
  Filled 2016-02-13: qty 1

## 2016-02-13 MED ORDER — ONDANSETRON 8 MG PO TBDP
8.0000 mg | ORAL_TABLET | Freq: Once | ORAL | Status: AC
  Administered 2016-02-13: 8 mg via ORAL
  Filled 2016-02-13: qty 1

## 2016-02-13 MED ORDER — KETOROLAC TROMETHAMINE 60 MG/2ML IM SOLN
60.0000 mg | Freq: Once | INTRAMUSCULAR | Status: AC
  Administered 2016-02-13: 60 mg via INTRAMUSCULAR
  Filled 2016-02-13: qty 2

## 2016-02-13 NOTE — ED Triage Notes (Signed)
Pt c/o severe L flank pain onset today @noon , pain radiating to LLQ. Denies n/v/d

## 2016-02-13 NOTE — ED Provider Notes (Signed)
[place] DEPT Provider Note   CSN: [id number] Arrival date & time: 02/13/16  2033     History   Chief Complaint Chief Complaint  Patient presents with  . Flank Pain    HPI Rhonda Higgins is a 45 y.o. female.  45 year old female with history of kidney stones, presents with left sided flank pain which is been colicky and similar to her prior kidney stones. Denies any fever or chills. No dysuria or hematuria but does note some dark urine. She has been using over-the-counter medications without relief. Denies any vomiting or diarrhea. Symptoms persistent and nothing makes them better worse. No treatment use prior to arrival      Past Medical History:  Diagnosis Date  . Adenocarcinoma (HCC)    endometrial, FIGO GRADE 1  . Allergic rhinitis   . Anemia   . Depression   . Diabetes mellitus   . Elevated cholesterol   . GERD (gastroesophageal reflux disease)   . Hyperlipidemia   . Kidney stone 6/13   H/O  . Non-insulin dependent type 2 diabetes mellitus ([place])   . Overactive bladder   . Personality disorder   . Polyphagia   . 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 Right lower quadrant pain   . Sleep apnea    doesnt use cpap  . Uterine cancer ([place])   . UTI (urinary tract infection)     Patient Active Problem List   Diagnosis Date Noted  . Multinodular goiter (nontoxic) 11/17/2015  . Chest pain 07/20/2014  . Diabetes mellitus without complication ([place]) [id number]  . GERD (gastroesophageal reflux disease) 07/19/2014  . Personality disorder   . Uterine cancer ([place]) 06/22/2011  . DM 02/09/2010  . HLD (hyperlipidemia) 02/09/2010  . Obstructive sleep apnea 02/09/2010  . Allergic rhinitis 02/09/2010  . History of uterine cancer 02/09/2010    Past Surgical History:  Procedure Laterality Date  . ENDOMETRIAL BIOPSY    . INNER EAR SURGERY     L ear d/t tumor and R ear d/t hole in ear drum  . THYROIDECTOMY N/A 11/19/2015   Procedure: TOTAL  THYROIDECTOMY;  Surgeon: Armandina Gemma, MD;  Location: [facility] ORS;  Service: General;  Laterality: N/A;  . TONSILLECTOMY     age 19  . TOTAL ABDOMINAL HYSTERECTOMY  feb 2011   BSO, node dissection    OB History    Gravida Para Term Preterm AB Living   0             SAB TAB Ectopic Multiple Live Births                   Home Medications    Prior to Admission medications   Medication Sig Start Date End Date Taking? Authorizing Provider  aspirin 81 MG tablet Take 162 mg by mouth every evening.     Historical Provider, MD  buPROPion (WELLBUTRIN XL) 150 MG 24 hr tablet Take 150 mg by mouth at bedtime.     Historical Provider, MD  calcium carbonate (OS-CAL - DOSED IN MG OF ELEMENTAL CALCIUM) 1250 (500 Ca) MG tablet Take 2 tablets (1,000 mg of elemental calcium total) by mouth 4 (four) times daily. 11/20/15   Armandina Gemma, MD  carvedilol (COREG) 3.125 MG tablet Take 1 tablet (3.125 mg total) by mouth 2 (two) times daily with a meal. Patient not taking: Reported on 01/09/2015 07/20/14   Oswald Hillock, MD  cetirizine (ZYRTEC) 10 MG tablet Take 10 mg by mouth at bedtime.  Historical Provider, MD  gabapentin (NEURONTIN) 400 MG capsule Take 400-1,200 mg by mouth 3 (three) times daily. Take one capsule in the mornning and afternoon and three capsules at bedtime.    Historical Provider, MD  glimepiride (AMARYL) 4 MG tablet Take 4 mg by mouth 2 (two) times daily.  12/21/14   Historical Provider, MD  ibuprofen (ADVIL,MOTRIN) 800 MG tablet Take 800 mg by mouth every 8 (eight) hours as needed for moderate pain.    Historical Provider, MD  lamoTRIgine (LAMICTAL) 200 MG tablet Take 200 mg by mouth every evening.  02/24/15   Historical Provider, MD  lisinopril (PRINIVIL,ZESTRIL) 5 MG tablet Take 5 mg by mouth daily.    Historical Provider, MD  Multiple Vitamin (MULTIVITAMIN WITH MINERALS) TABS tablet Take 1 tablet by mouth daily.    Historical Provider, MD  pantoprazole (PROTONIX) 40 MG tablet Take 40 mg by mouth at  bedtime.  08/16/15   Historical Provider, MD  risperiDONE (RISPERDAL) 1 MG tablet Take 1-2 mg by mouth as directed. Take 1 tablet (1 mg) in the morning and Take 2 tablets (2 mg) at bedtime. 12/30/14   Historical Provider, MD  simvastatin (ZOCOR) 40 MG tablet Take 40 mg by mouth every evening.    Historical Provider, MD  sitaGLIPtin-metformin (JANUMET) 50-1000 MG tablet Take 1 tablet by mouth 2 (two) times daily with a meal.    Historical Provider, MD  SYNTHROID 100 MCG tablet Take 1 tablet (100 mcg total) by mouth daily. 11/20/15   Armandina Gemma, MD  terbinafine (LAMISIL) 250 MG tablet Take 250 mg by mouth daily. 11/04/15   Historical Provider, MD  traMADol (ULTRAM) 50 MG tablet Take 1-2 tablets (50-100 mg total) by mouth every 6 (six) hours as needed for moderate pain. 11/20/15   Armandina Gemma, MD    Family History Family History  Problem Relation Age of Onset  . Heart disease Sister   . Heart attack Brother   . Heart disease Brother   . Heart disease Sister   . Diabetes Sister   . Asthma Mother   . Heart disease Mother   . Diabetes Mother   . Emphysema Mother   . Hypertension Mother   . Stroke Mother   . Prostate cancer Father     Social History Social History  Substance Use Topics  . Smoking status: Never Smoker  . Smokeless tobacco: Never Used  . Alcohol use Yes     Allergies   Hydrocodone-acetaminophen and Codeine   Review of Systems Review of Systems  All other systems reviewed and are negative.    Physical Exam Updated Vital Signs BP 143/87 (BP Location: Left Arm)   Pulse 92   Temp 98.4 F (36.9 C) (Oral)   Resp 20   Ht 5\' 3"  (1.6 m)   Wt 108.4 kg   SpO2 94%   BMI 42.34 kg/m   Physical Exam  Constitutional: She is oriented to person, place, and time. She appears well-developed and well-nourished.  Non-toxic appearance. No distress.  HENT:  Head: Normocephalic and atraumatic.  Eyes: Conjunctivae, EOM and lids are normal. Pupils are equal, round, and reactive  to light.  Neck: Normal range of motion. Neck supple. No tracheal deviation present. No thyroid mass present.  Cardiovascular: Normal rate, regular rhythm and normal heart sounds.  Exam reveals no gallop.   No murmur heard. Pulmonary/Chest: Effort normal and breath sounds normal. No stridor. No respiratory distress. She has no decreased breath sounds. She has no wheezes. She  has no rhonchi. She has no rales.  Abdominal: Soft. Normal appearance and bowel sounds are normal. She exhibits no distension. There is no tenderness. There is no rebound and no CVA tenderness.  Musculoskeletal: Normal range of motion. She exhibits no edema or tenderness.  Neurological: She is alert and oriented to person, place, and time. She has normal strength. No cranial nerve deficit or sensory deficit. GCS eye subscore is 4. GCS verbal subscore is 5. GCS motor subscore is 6.  Skin: Skin is warm and dry. No abrasion and no rash noted.  Psychiatric: She has a normal mood and affect. Her speech is normal and behavior is normal.  Nursing note and vitals reviewed.    ED Treatments / Results  Labs (all labs ordered are listed, but only abnormal results are displayed) Labs Reviewed  URINALYSIS, ROUTINE W REFLEX MICROSCOPIC (NOT AT [facility])    EKG  EKG Interpretation None       Radiology No results found.  Procedures Procedures (including critical care time)  Medications Ordered in ED Medications  HYDROmorphone (DILAUDID) injection 1 mg (not administered)  ondansetron (ZOFRAN-ODT) disintegrating tablet 8 mg (not administered)  ketorolac (TORADOL) injection 60 mg (not administered)     Initial Impression / Assessment and Plan / ED Course  I have reviewed the triage vital signs and the nursing notes.  Pertinent labs & imaging results that were available during my care of the patient were reviewed by me and considered in my medical decision making (see chart for details).  Clinical Course    Patient's  pain controlled here. Does have a 5 mm kidney stone on CT scan and patient to follow-up with her urologist.  Final Clinical Impressions(s) / ED Diagnoses   Final diagnoses:  None    New Prescriptions New Prescriptions   No medications on file     Lacretia Leigh, MD 02/14/16 [phone]

## 2016-02-14 MED ORDER — HYDROMORPHONE HCL 2 MG PO TABS: 2.0000 mg | ORAL_TABLET | ORAL | 0 refills | Status: DC | PRN

## 2016-02-14 MED ORDER — ONDANSETRON 8 MG PO TBDP: 8.0000 mg | ORAL_TABLET | Freq: Three times a day (TID) | ORAL | 0 refills | Status: DC | PRN

## 2016-02-14 MED ORDER — OXYCODONE-ACETAMINOPHEN 5-325 MG PO TABS: 1.0000 | ORAL_TABLET | ORAL | 0 refills | Status: DC | PRN

## 2016-03-03 ENCOUNTER — Other Ambulatory Visit: Payer: Self-pay | Admitting: Urology

## 2016-03-07 ENCOUNTER — Encounter (HOSPITAL_BASED_OUTPATIENT_CLINIC_OR_DEPARTMENT_OTHER): Payer: Self-pay | Admitting: *Deleted

## 2016-03-07 NOTE — H&P (Signed)
Office Visit Report     03/02/2016   --------------------------------------------------------------------------------   Rhonda Higgins  MRN: [id number]  PRIMARY CARE:  Rhonda Cowman, MD  DOB: May 17, 1971, 45 year old Female  REFERRING:  Rhonda Higgins, [place]  SSN:   PROVIDER:  Rana Higgins, M.D.    TREATING:  Rhonda Higgins    LOCATION:  [facility] [phone]   --------------------------------------------------------------------------------   CC/HPI: Returns today with recurrent flank/abominal pain for 2 days. Has had some gross hematuria but denies passing any stone material. Is not currently taking alpha blocker.     ALLERGIES: Codeine Derivatives Myrbetriq TB24 Vicodin TABS    MEDICATIONS: Aspirin 81 MG TABS 0 Oral  Glimepiride  Janumet Xr 50 mg-1,000 mg tablet,extended release multiphase 24 hr  LaMICtal TBDP Oral  Lisinopril TABS Oral  Neurontin 400 MG Oral Capsule 3 Oral  Protonix  RisperDAL 3 MG Oral Tablet Oral  Simvastatin 40 MG Oral Tablet 0 Oral  Synthroid 50 mcg tablet  Wellbutrin XL 150 MG Oral Tablet Extended Release 24 Hour 1 Oral Daily  ZyrTEC Allergy TABS Oral     GU PSH: Hysterectomy Unilat SO - 2012      PSH Notes: Ear Surgery   NON-GU PSH: Thyroid Surgery, thyroidectomy w/goiter - 11/19/2015    GU PMH: Asymptomatic microscopic hematuria (Worsening), Could be from possible recently passed stone vs remaining stone. - 02/25/2016 Calculus Ureter, Left, No definitive left ureteral calculus noted. At this time appears she could have passed stone. If pain returns will need repeat CT urogram and possible need to proceed with cystourethroscopy, (L) RPG, stone extraction - 02/25/2016, Calculus Ureter - 02/18/2016, Calculus of left ureter, - 2014, Calculus of distal right ureter, - 2014 Kidney Stone, Nephrolithiasis - 11/06/2014 Hydronephrosis Unspec, Hydronephrosis, left - 2014, Hydronephrosis, right, - 2014 Recurrent Cystitis w/o hematuria, Chronic  cystitis - 2014 Urinary Frequency, Increased urinary frequency - 2014      PMH Notes:  2011-04-17 14:16:42 - Note: Chronic Reflux Esophagitis  2011-04-17 14:16:42 - Note: Uterine Cancer   NON-GU PMH: Encounter for general adult medical examination without abnormal findings, Encounter for preventive health examination - 2015 Allergy status to unspecified drugs, medicaments and biological substances status, Allergies - 2014 Personal history of other diseases of the nervous system and sense organs, History of sleep apnea - 2014 Personal history of other endocrine, nutritional and metabolic disease, History of hyperlipidemia - 2014, History of diabetes mellitus, - 2014 Personal history of other mental and behavioral disorders, History of depression - 2014    FAMILY HISTORY: Diabetes - Mother Family Health Status Number - Runs In Family Father Deceased At [place] ___ - Runs In Family Heart Disease - Mother, Sister Kidney Stones - Runs In Family Mother Deceased At Age 58 from diabetic complicati - Runs In Family Prostate Cancer - Father   SOCIAL HISTORY: Marital Status: Single Current Smoking Status: Patient has never smoked.  Has never drank.  Drinks 1 caffeinated drink per day. Patient's occupation is/was unemployed.    REVIEW OF SYSTEMS:    GU Review Female:   gross hematuria flank pain. Patient denies frequent urination, hard to postpone urination, burning /pain with urination, get up at night to urinate, leakage of urine, stream starts and stops, trouble starting your stream, have to strain to urinate, and currently pregnant.  Gastrointestinal (Upper):   Patient denies nausea, vomiting, and indigestion/ heartburn.  Gastrointestinal (Lower):   Patient reports constipation. Patient denies diarrhea.  Constitutional:   Patient denies fever, night  sweats, weight loss, and fatigue.  Skin:   Patient denies skin rash/ lesion and itching.  Eyes:   Patient denies blurred vision and double  vision.  Ears/ Nose/ Throat:   Patient denies sore throat and sinus problems.  Hematologic/Lymphatic:   Patient denies swollen glands and easy bruising.  Cardiovascular:   Patient denies leg swelling and chest pains.  Respiratory:   Patient denies cough and shortness of breath.  Endocrine:   Patient denies excessive thirst.  Musculoskeletal:   Patient denies back pain and joint pain.  Neurological:   Patient denies headaches and dizziness.  Psychologic:   Patient denies depression and anxiety.   VITAL SIGNS:      03/02/2016 09:25 AM  BP 103/67 mmHg  Pulse 71 /min  Temperature 97.7 F / 36 C   MULTI-SYSTEM PHYSICAL EXAMINATION:    Constitutional: Obese. No physical deformities. Normally developed. Good grooming.   Respiratory: No labored breathing, no use of accessory muscles.   Cardiovascular: Normal temperature, normal extremity pulses, no swelling, no varicosities.   Neurologic / Psychiatric: Oriented to time, oriented to place, oriented to person. No depression, no anxiety, no agitation.   Gastrointestinal: No mass, no tenderness, no rigidity, non obese abdomen.      PAST DATA REVIEWED:  Source Of History:  Patient  Records Review:   Previous Patient Records  Urine Test Review:   Urinalysis, Urine Culture and Sensitivity  X-Ray Review: KUB: Reviewed Films. Discussed With Patient. 02/25/16 unable to visualize left ureteral calculus C.T. Stone Protocol: Reviewed Films. Discussed With Patient. Shows progression of left ureteral stone to now near bladder.     PROCEDURES:         C.T. Urogram - [id number]      It appears good progression of left ureteral calculus. Stone now near bladder. No left hydronephrosis but mildly dilated left ureter to level of distal stone.    IMPRESSION:  1. The previously demonstrated proximal left ureteral calculus has  moved into the distal ureter and measures 4 mm in maximum diameter  in the axial plane. This is causing mild left hydronephrosis and   hydroureter.  2. Tiny, nonobstructing left renal calculus.  3. Small umbilical hernia containing fat.          Urinalysis w/Scope - 81001 Dipstick Dipstick Cont'd Micro  Specimen: Voided Bilirubin: Neg WBC/hpf: 0-5/hpf  Color: Amber Ketones: Neg RBC/hpf: >60/hpf  Appearance: Cloudy Blood: 3+ Bacteria: Rare  Specific Gravity: 1.025 Protein: Neg Cystals: NS (Not Seen)  pH: 6.0 Urobilinogen: 0.2 Casts: NS (Not Seen)  Glucose: Neg Nitrites: Neg Trichomonas: Not Present    Leukocyte Esterase: Neg Mucous: Not Present      Epithelial Cells: 0-5/hpf      Yeast: NS (Not Seen)      Sperm: Not Present         Ketoralac 55m - [id number]: 60 Adm. By: RMarin Roberts Unit: mg Lot No [id number]  Route: IM Exp. Date 06/19/2017  Freq: None Mfgr.:   Site: Right Buttock   ASSESSMENT:      ICD-10 Details  1 GU:   Calculus Ureter - N20.1 Left, Improving, Chronic - Has now progressed into pelvis and near left UVJ. Will begin Tamsulosin 0.4 mg 1 po daily  2   Asymptomatic microscopic hematuria - R31.21 Worsening, Chronic - Secondary to distal left ureteral calculus   PLAN:            Medications New Meds: Ketorolac Tromethamine 10 mg tablet 1  tablet PO Q 6 H PRN   #20  1 Refill(s)  Tamsulosin Hcl 0.4 mg capsule, ext release 24 hr 1 capsule PO Daily   #30  0 Refill(s)  Hydromorphone Hcl 2 mg tablet 1 tablet PO Q 6 H PRN   #30  0 Refill(s)    Stop Meds: Dilaudid 2 mg tablet 1 tablet PO Q 6 H PRN  Start: 02/18/2016  Discontinue: 03/02/2016  - Reason: The medication cycle was completed.            Orders X-Rays: C.T. Stone Protocol Without Contrast - KUB 02/25/16 unable to visualize stone          Schedule Procedure: 03/02/2016 at [facility] - [phone] - Ketoralac 28m (Toradol Per 15 Mg) - [id number] [phone]         Document Letter(s):  Created for Patient: Clinical Summary         Notes:   Will have pt continue with MET and will schedule pt with Dr. GRisa Grillfor  elective cystourethroscopy, (L) RPG, stone extraction, and possible double J stent placement. Risks and benefits discussed with pt of: general anesthesia, infection, and possible injury to bladder or ureter, and possible need for stent. All questions answered and wishes to proceed.     E & M CODE: I spent at least 25 minutes face to face with the patient, more than 50% of that time was spent on counseling and/or coordinating care.     * Signed by DJimmey Ralphon 03/02/16 at 11:31 AM (EDT)*     The information contained in this medical record document is considered private and confidential patient information. This information can only be used for the medical diagnosis and/or medical services that are being provided by the patient's selected caregivers. This information can only be distributed outside of the patient's care if the patient agrees and signs waivers of authorization for this information to be sent to an outside source or route.

## 2016-03-07 NOTE — Progress Notes (Addendum)
NPO AFTER MN.  ARRIVE AT 0700.  NEEDS ISTAT.  CURRENT EKG IN CHART AND EPIC.  WILL TAKE GABAPENTIN AND SYNTHROID AM DOS W/ SIPS OF WATER AND IF NEEDED TAKE DILAUDID/ ZOFRAN . ASK MDA IF CXR NEEDED POST TOTAL THYROIDECTOMY 11-19-2015.

## 2016-03-08 ENCOUNTER — Ambulatory Visit (HOSPITAL_BASED_OUTPATIENT_CLINIC_OR_DEPARTMENT_OTHER): Payer: Medicaid Other | Admitting: Anesthesiology

## 2016-03-08 ENCOUNTER — Ambulatory Visit (HOSPITAL_BASED_OUTPATIENT_CLINIC_OR_DEPARTMENT_OTHER)
Admission: RE | Admit: 2016-03-08 | Discharge: 2016-03-08 | Disposition: A | Discharge status: Home / Self Care | Payer: Medicaid Other | Source: Ambulatory Visit | Attending: Urology | Admitting: Urology

## 2016-03-08 ENCOUNTER — Encounter (HOSPITAL_BASED_OUTPATIENT_CLINIC_OR_DEPARTMENT_OTHER)
Admission: RE | Disposition: A | Discharge status: Home / Self Care | Payer: Self-pay | Source: Ambulatory Visit | Attending: Urology

## 2016-03-08 ENCOUNTER — Encounter (HOSPITAL_BASED_OUTPATIENT_CLINIC_OR_DEPARTMENT_OTHER): Payer: Self-pay | Admitting: *Deleted

## 2016-03-08 DIAGNOSIS — Z8542 Personal history of malignant neoplasm of other parts of uterus: Secondary | ICD-10-CM | POA: Diagnosis not present

## 2016-03-08 DIAGNOSIS — E119 Type 2 diabetes mellitus without complications: Secondary | ICD-10-CM | POA: Insufficient documentation

## 2016-03-08 DIAGNOSIS — F329 Major depressive disorder, single episode, unspecified: Secondary | ICD-10-CM | POA: Insufficient documentation

## 2016-03-08 DIAGNOSIS — Z79899 Other long term (current) drug therapy: Secondary | ICD-10-CM | POA: Diagnosis not present

## 2016-03-08 DIAGNOSIS — N132 Hydronephrosis with renal and ureteral calculous obstruction: Secondary | ICD-10-CM | POA: Insufficient documentation

## 2016-03-08 DIAGNOSIS — Z87442 Personal history of urinary calculi: Secondary | ICD-10-CM | POA: Diagnosis not present

## 2016-03-08 DIAGNOSIS — G4730 Sleep apnea, unspecified: Secondary | ICD-10-CM | POA: Insufficient documentation

## 2016-03-08 DIAGNOSIS — K210 Gastro-esophageal reflux disease with esophagitis: Secondary | ICD-10-CM | POA: Diagnosis not present

## 2016-03-08 DIAGNOSIS — Z6841 Body Mass Index (BMI) 40.0 and over, adult: Secondary | ICD-10-CM | POA: Insufficient documentation

## 2016-03-08 DIAGNOSIS — Z7982 Long term (current) use of aspirin: Secondary | ICD-10-CM | POA: Insufficient documentation

## 2016-03-08 DIAGNOSIS — Z7984 Long term (current) use of oral hypoglycemic drugs: Secondary | ICD-10-CM | POA: Diagnosis not present

## 2016-03-08 DIAGNOSIS — E785 Hyperlipidemia, unspecified: Secondary | ICD-10-CM | POA: Insufficient documentation

## 2016-03-08 HISTORY — DX: Personal history of irradiation: Z92.3

## 2016-03-08 HISTORY — DX: Hematuria, unspecified: R31.9

## 2016-03-08 HISTORY — DX: Calculus of ureter: N20.1

## 2016-03-08 HISTORY — DX: Personal history of malignant neoplasm of other parts of uterus: Z85.42

## 2016-03-08 HISTORY — DX: Obstructive sleep apnea (adult) (pediatric): G47.33

## 2016-03-08 HISTORY — PX: HOLMIUM LASER APPLICATION: SHX5852

## 2016-03-08 HISTORY — DX: Urgency of urination: R39.15

## 2016-03-08 HISTORY — DX: Postprocedural hypothyroidism: E89.0

## 2016-03-08 HISTORY — DX: Personal history of urinary calculi: Z87.442

## 2016-03-08 HISTORY — DX: Personal history of other endocrine, nutritional and metabolic disease: Z86.39

## 2016-03-08 HISTORY — PX: CYSTOSCOPY/RETROGRADE/URETEROSCOPY/STONE EXTRACTION WITH BASKET: SHX5317

## 2016-03-08 LAB — POCT I-STAT 4, (NA,K, GLUC, HGB,HCT)
Glucose, Bld: 82 mg/dL (ref 65–99)
HCT: 43 % (ref 36.0–46.0)
Hemoglobin: 14.6 g/dL (ref 12.0–15.0)
Potassium: 3.6 mmol/L (ref 3.5–5.1)
Sodium: 142 mmol/L (ref 135–145)

## 2016-03-08 SURGERY — CYSTOSCOPY, WITH CALCULUS REMOVAL USING BASKET
Anesthesia: General | Site: Ureter | Laterality: Left

## 2016-03-08 MED ORDER — CEFAZOLIN SODIUM-DEXTROSE 2-4 GM/100ML-% IV SOLN
2.0000 g | INTRAVENOUS | Status: AC
  Administered 2016-03-08: 2 g via INTRAVENOUS
  Filled 2016-03-08: qty 100

## 2016-03-08 MED ORDER — PROPOFOL 10 MG/ML IV BOLUS
INTRAVENOUS | Status: DC | PRN
  Administered 2016-03-08: 200 mg via INTRAVENOUS

## 2016-03-08 MED ORDER — STERILE WATER FOR IRRIGATION IR SOLN
Status: DC | PRN
  Administered 2016-03-08: 500 mL

## 2016-03-08 MED ORDER — ONDANSETRON HCL 4 MG/2ML IJ SOLN
4.0000 mg | Freq: Four times a day (QID) | INTRAMUSCULAR | Status: DC | PRN
  Filled 2016-03-08: qty 2

## 2016-03-08 MED ORDER — FENTANYL CITRATE (PF) 100 MCG/2ML IJ SOLN
INTRAMUSCULAR | Status: AC
  Filled 2016-03-08: qty 2

## 2016-03-08 MED ORDER — KETOROLAC TROMETHAMINE 30 MG/ML IJ SOLN
INTRAMUSCULAR | Status: DC | PRN
  Administered 2016-03-08: 30 mg via INTRAVENOUS

## 2016-03-08 MED ORDER — ARTIFICIAL TEARS OP OINT
TOPICAL_OINTMENT | OPHTHALMIC | Status: AC
  Filled 2016-03-08: qty 10.5

## 2016-03-08 MED ORDER — FENTANYL CITRATE (PF) 100 MCG/2ML IJ SOLN
25.0000 ug | INTRAMUSCULAR | Status: DC | PRN
Start: 2016-03-08 — End: 2016-03-08
  Filled 2016-03-08: qty 1

## 2016-03-08 MED ORDER — LACTATED RINGERS IV SOLN
INTRAVENOUS | Status: DC
  Administered 2016-03-08: 08:00:00 via INTRAVENOUS
  Filled 2016-03-08: qty 1000

## 2016-03-08 MED ORDER — CEFAZOLIN SODIUM-DEXTROSE 2-4 GM/100ML-% IV SOLN
INTRAVENOUS | Status: AC
  Filled 2016-03-08: qty 100

## 2016-03-08 MED ORDER — MIDAZOLAM HCL 2 MG/2ML IJ SOLN
INTRAMUSCULAR | Status: AC
  Filled 2016-03-08: qty 2

## 2016-03-08 MED ORDER — FENTANYL CITRATE (PF) 100 MCG/2ML IJ SOLN
INTRAMUSCULAR | Status: DC | PRN
  Administered 2016-03-08: 50 ug via INTRAVENOUS

## 2016-03-08 MED ORDER — CEPHALEXIN 250 MG PO CAPS: 250.0000 mg | ORAL_CAPSULE | ORAL | 0 refills | Status: AC

## 2016-03-08 MED ORDER — DEXAMETHASONE SODIUM PHOSPHATE 4 MG/ML IJ SOLN
INTRAMUSCULAR | Status: DC | PRN
  Administered 2016-03-08: 10 mg via INTRAVENOUS

## 2016-03-08 MED ORDER — CEFAZOLIN IN D5W 1 GM/50ML IV SOLN
1.0000 g | INTRAVENOUS | Status: DC
  Filled 2016-03-08: qty 50

## 2016-03-08 MED ORDER — LIDOCAINE 2% (20 MG/ML) 5 ML SYRINGE
INTRAMUSCULAR | Status: DC | PRN
  Administered 2016-03-08: 100 mg via INTRAVENOUS

## 2016-03-08 MED ORDER — ONDANSETRON HCL 4 MG/2ML IJ SOLN
INTRAMUSCULAR | Status: DC | PRN
  Administered 2016-03-08: 4 mg via INTRAVENOUS

## 2016-03-08 MED ORDER — SODIUM CHLORIDE 0.9 % IR SOLN
Status: DC | PRN
  Administered 2016-03-08: 4000 mL

## 2016-03-08 MED ORDER — MIDAZOLAM HCL 5 MG/5ML IJ SOLN
INTRAMUSCULAR | Status: DC | PRN
  Administered 2016-03-08: 2 mg via INTRAVENOUS

## 2016-03-08 MED ORDER — IOHEXOL 300 MG/ML  SOLN
INTRAMUSCULAR | Status: DC | PRN
  Administered 2016-03-08: 10 mL

## 2016-03-08 MED ORDER — OXYCODONE HCL 5 MG/5ML PO SOLN
5.0000 mg | Freq: Once | ORAL | Status: DC | PRN
  Filled 2016-03-08: qty 5

## 2016-03-08 MED ORDER — URIBEL 118 MG PO CAPS: 1.0000 | ORAL_CAPSULE | Freq: Three times a day (TID) | ORAL | 1 refills | Status: DC | PRN

## 2016-03-08 MED ORDER — PROPOFOL 500 MG/50ML IV EMUL
INTRAVENOUS | Status: AC
  Filled 2016-03-08: qty 100

## 2016-03-08 MED ORDER — OXYCODONE HCL 5 MG PO TABS
5.0000 mg | ORAL_TABLET | Freq: Once | ORAL | Status: DC | PRN
  Filled 2016-03-08: qty 1

## 2016-03-08 SURGICAL SUPPLY — 40 items
APL SKNCLS STERI-STRIP NONHPOA (GAUZE/BANDAGES/DRESSINGS) ×1
BAG DRAIN URO-CYSTO SKYTR STRL (DRAIN) ×2 IMPLANT
BAG DRN UROCATH (DRAIN) ×1
BASKET LASER NITINOL 1.9FR (BASKET) IMPLANT
BASKET STNLS GEMINI 4WIRE 3FR (BASKET) IMPLANT
BASKET ZERO TIP NITINOL 2.4FR (BASKET) ×1 IMPLANT
BENZOIN TINCTURE PRP APPL 2/3 (GAUZE/BANDAGES/DRESSINGS) ×1 IMPLANT
BSKT STON RTRVL 120 1.9FR (BASKET)
BSKT STON RTRVL GEM 120X11 3FR (BASKET)
BSKT STON RTRVL ZERO TP 2.4FR (BASKET) ×1
CATH INTERMIT  6FR 70CM (CATHETERS) ×1 IMPLANT
CATH URET 5FR 28IN CONE TIP (BALLOONS)
CATH URET 5FR 28IN OPEN ENDED (CATHETERS) IMPLANT
CATH URET 5FR 70CM CONE TIP (BALLOONS) IMPLANT
CLOTH BEACON ORANGE TIMEOUT ST (SAFETY) ×2 IMPLANT
DRSG TEGADERM 2-3/8X2-3/4 SM (GAUZE/BANDAGES/DRESSINGS) ×1 IMPLANT
FIBER LASER FLEXIVA 365 (UROLOGICAL SUPPLIES) ×1 IMPLANT
FIBER LASER TRAC TIP (UROLOGICAL SUPPLIES) IMPLANT
GLOVE BIO SURGEON STRL SZ7.5 (GLOVE) ×2 IMPLANT
GLOVE BIOGEL PI IND STRL 7.0 (GLOVE) IMPLANT
GLOVE BIOGEL PI INDICATOR 7.0 (GLOVE) ×3
GLOVE INDICATOR 7.0 STRL GRN (GLOVE) ×2 IMPLANT
GOWN STRL REUS W/ TWL XL LVL3 (GOWN DISPOSABLE) ×1 IMPLANT
GOWN STRL REUS W/TWL XL LVL3 (GOWN DISPOSABLE) ×2
GUIDEWIRE 0.038 PTFE COATED (WIRE) IMPLANT
GUIDEWIRE ANG ZIPWIRE 038X150 (WIRE) IMPLANT
GUIDEWIRE STR DUAL SENSOR (WIRE) ×1 IMPLANT
IV NS 1000ML (IV SOLUTION) ×2
IV NS 1000ML BAXH (IV SOLUTION) IMPLANT
IV NS IRRIG 3000ML ARTHROMATIC (IV SOLUTION) ×3 IMPLANT
KIT BALLIN UROMAX 15FX10 (LABEL) IMPLANT
KIT BALLN UROMAX 15FX4 (MISCELLANEOUS) IMPLANT
KIT BALLN UROMAX 26 75X4 (MISCELLANEOUS)
KIT ROOM TURNOVER WOR (KITS) ×2 IMPLANT
MANIFOLD NEPTUNE II (INSTRUMENTS) IMPLANT
PACK CYSTO (CUSTOM PROCEDURE TRAY) ×2 IMPLANT
SET HIGH PRES BAL DIL (LABEL)
SHEATH ACCESS URETERAL 38CM (SHEATH) IMPLANT
STENT URET 6FRX24 CONTOUR (STENTS) ×1 IMPLANT
TUBE CONNECTING 12X1/4 (SUCTIONS) IMPLANT

## 2016-03-08 NOTE — Anesthesia Preprocedure Evaluation (Addendum)
Anesthesia Evaluation  Patient identified by MRN, date of birth, ID band Patient awake    Reviewed: Allergy & Precautions, H&P , NPO status , Patient's Chart, lab work & pertinent test results  Airway Mallampati: II   Neck ROM: full    Dental  (+) Missing, Teeth Intact,    Pulmonary sleep apnea ,    breath sounds clear to auscultation       Cardiovascular negative cardio ROS   Rhythm:regular Rate:Normal     Neuro/Psych PSYCHIATRIC DISORDERS Anxiety Depression    GI/Hepatic GERD  Medicated and Controlled,  Endo/Other  diabetes, Type 2, Oral Hypoglycemic AgentsHypothyroidism Morbid obesity  Renal/GU stones     Musculoskeletal   Abdominal   Peds  Hematology   Anesthesia Other Findings   Reproductive/Obstetrics                          Anesthesia Physical Anesthesia Plan  ASA: II  Anesthesia Plan: General   Post-op Pain Management:    Induction: Intravenous  Airway Management Planned: LMA  Additional Equipment:   Intra-op Plan:   Post-operative Plan:   Informed Consent: I have reviewed the patients History and Physical, chart, labs and discussed the procedure including the risks, benefits and alternatives for the proposed anesthesia with the patient or authorized representative who has indicated his/her understanding and acceptance.     Plan Discussed with: CRNA, Anesthesiologist and Surgeon  Anesthesia Plan Comments:         Anesthesia Quick Evaluation

## 2016-03-08 NOTE — Interval H&P Note (Signed)
History and Physical Interval Note:  03/08/2016 8:35 AM  Foster L Applegarth  has presented today for surgery, with the diagnosis of LEFT URETERAL CALCULUS  The various methods of treatment have been discussed with the patient and family. After consideration of risks, benefits and other options for treatment, the patient has consented to  Procedure(s): CYSTOSCOPY/RETROGRADE/URETEROSCOPY/STONE EXTRACTION WITH BASKET (Left) HOLMIUM LASER APPLICATION (Left) as a surgical intervention .  The patient's history has been reviewed, patient examined, no change in status, stable for surgery.  I have reviewed the patient's chart and labs.  Questions were answered to the patient's satisfaction.     Sarea Fyfe S

## 2016-03-08 NOTE — Discharge Instructions (Addendum)
°Post Anesthesia Home Care Instructions ° °Activity: °Get plenty of rest for the remainder of the day. A responsible adult should stay with you for 24 hours following the procedure.  °For the next 24 hours, DO NOT: °-Drive a car °-Operate machinery °-Drink alcoholic beverages °-Take any medication unless instructed by your physician °-Make any legal decisions or sign important papers. ° °Meals: °Start with liquid foods such as gelatin or soup. Progress to regular foods as tolerated. Avoid greasy, spicy, heavy foods. If nausea and/or vomiting occur, drink only clear liquids until the nausea and/or vomiting subsides. Call your physician if vomiting continues. ° °Special Instructions/Symptoms: °Your throat may feel dry or sore from the anesthesia or the breathing tube placed in your throat during surgery. If this causes discomfort, gargle with warm salt water. The discomfort should disappear within 24 hours. ° °If you had a scopolamine patch placed behind your ear for the management of post- operative nausea and/or vomiting: ° °1. The medication in the patch is effective for 72 hours, after which it should be removed.  Wrap patch in a tissue and discard in the trash. Wash hands thoroughly with soap and water. °2. You may remove the patch earlier than 72 hours if you experience unpleasant side effects which may include dry mouth, dizziness or visual disturbances. °3. Avoid touching the patch. Wash your hands with soap and water after contact with the patch. °  °[facility] °[phone] °Post Ureteroscopy With or Without Stent Instructions ° °Definitions: ° °Ureter: The duct that transports urine from the kidney to the bladder. °Stent:   A plastic hollow tube that is placed into the ureter, from the kidney to the                 bladder to prevent the ureter from swelling shut. ° °GENERAL INSTRUCTIONS: ° °Despite the fact that no skin incisions were used, the area around the ureter and bladder is raw  and irritated. The stent is a foreign body which will further irritate the bladder wall. This irritation is manifested by increased frequency of urination, both day and night, and by an increase in the urge to urinate. In some, the urge to urinate is present almost always. Sometimes the urge is strong enough that you may not be able to stop yourself from urinating. The only real cure is to remove the stent and then give time for the bladder wall to heal which can't be done until the danger of the ureter swelling shut has passed, which varies. ° °You may see some blood in your urine while the stent is in place and a few days afterwards. Do not be alarmed, even if the urine was clear for a while. Get off your feet and drink lots of fluids until clearing occurs. If you start to pass clots or don't improve, call us. ° °DIET: °You may return to your normal diet immediately. Because of the raw surface of your bladder, alcohol, spicy foods, acid type foods and drinks with caffeine may cause irritation or frequency and should be used in moderation. To keep your urine flowing freely and to avoid constipation, drink plenty of fluids during the day ( 8-10 glasses ). °Tip: Avoid cranberry juice because it is very acidic. ° °ACTIVITY: °Your physical activity doesn't need to be restricted. However, if you are very active, you may see some blood in your urine. We suggest that you reduce your activity under these circumstances until the bleeding has stopped. ° °BOWELS: °  It is important to keep your bowels regular during the postoperative period. Straining with bowel movements can cause bleeding. A bowel movement every other day is reasonable. Use a mild laxative if needed, such as Milk of Magnesia 2-3 tablespoons, or 2 Dulcolax tablets. Call if you continue to have problems. If you have been taking narcotics for pain, before, during or after your surgery, you may be constipated. Take a laxative if necessary.   MEDICATION: You  should resume your pre-surgery medications unless told not to. In addition you will often be given an antibiotic to prevent infection. These should be taken as prescribed until the bottles are finished unless you are having an unusual reaction to one of the drugs.  PROBLEMS YOU SHOULD REPORT TO [place]:  Fevers over 100.5 Fahrenheit.  Heavy bleeding, or clots ( See above notes about blood in urine ).  Inability to urinate.  Drug reactions ( hives, rash, nausea, vomiting, diarrhea ).  Severe burning or pain with urination that is not improving.  If you have a string attached to your stent, you may remove it on Sunday AM.  To do this, pull the string until the stent is completely removed.  You may feel an odd sensation in your back.

## 2016-03-08 NOTE — Transfer of Care (Signed)
Immediate Anesthesia Transfer of Care Note  Patient: Porfiria L Applegarth  Procedure(s) Performed: Procedure(s): CYSTOSCOPY/RETROGRADE/URETEROSCOPY/STONE EXTRACTION WITH BASKET, STENT PLACEMENT (Left) HOLMIUM LASER APPLICATION (Left)  Patient Location: PACU  Anesthesia Type:General  Level of Consciousness: awake, alert , oriented and patient cooperative  Airway & Oxygen Therapy: Patient Spontanous Breathing and Patient connected to nasal cannula oxygen  Post-op Assessment: Report given to RN and Post -op Vital signs reviewed and stable  Post vital signs: Reviewed and stable  Last Vitals:  Vitals:   03/08/16 0703  BP: 117/74  Pulse: 78  Resp: 16  Temp: 36.9 C    Last Pain:  Vitals:   03/08/16 0703  TempSrc: Oral      Patients Stated Pain Goal: 5 ([id number])  Complications: No apparent anesthesia complications

## 2016-03-08 NOTE — Anesthesia Postprocedure Evaluation (Signed)
Anesthesia Post Note  Patient: Rhonda Higgins  Procedure(s) Performed: Procedure(s) (LRB): CYSTOSCOPY/RETROGRADE/URETEROSCOPY/STONE EXTRACTION WITH BASKET, STENT PLACEMENT (Left) HOLMIUM LASER APPLICATION (Left)  Patient location during evaluation: PACU Anesthesia Type: General Level of consciousness: awake and alert and patient cooperative Pain management: pain level controlled Vital Signs Assessment: post-procedure vital signs reviewed and stable Respiratory status: spontaneous breathing and respiratory function stable Cardiovascular status: stable Anesthetic complications: no    Last Vitals:  Vitals:   03/08/16 0945 03/08/16 1000  BP: 125/65 119/66  Pulse: 78 79  Resp: 14 14  Temp:      Last Pain:  Vitals:   03/08/16 0945  TempSrc:   PainSc: 0-No pain                 Nalin Mazzocco S

## 2016-03-08 NOTE — Anesthesia Procedure Notes (Signed)
Procedure Name: LMA Insertion Date/Time: 03/08/2016 8:35 AM Performed by: Wanita Chamberlain Pre-anesthesia Checklist: Patient identified, Timeout performed, Emergency Drugs available, Suction available and Patient being monitored Patient Re-evaluated:Patient Re-evaluated prior to inductionOxygen Delivery Method: Circle system utilized Preoxygenation: Pre-oxygenation with 100% oxygen Intubation Type: IV induction Ventilation: Mask ventilation without difficulty LMA: LMA inserted LMA Size: 4.0 Number of attempts: 1 Placement Confirmation: breath sounds checked- equal and bilateral and positive ETCO2 Tube secured with: Tape Dental Injury: Teeth and Oropharynx as per pre-operative assessment

## 2016-03-08 NOTE — Op Note (Signed)
Preoperative diagnosis: left ureteral calculus  Postoperative diagnosis: left ureteral calculus  Procedure:  1. Cystoscopy 2. left ureteroscopy and stone removal 3. Ureteroscopic laser lithotripsy 4. left ureteral stent placement (40F) 24cm with string 5. left retrograde pyelography with interpretation  Surgeon: Bernestine Amass, MD  Anesthesia: General  Complications: None  Intraoperative findings: left retrograde pyelography demonstrated a filling defect within the left ureter consistent with the patient's known calculus without other abnormalities.  EBL: Minimal  Specimens: 1. left ureteral calculus  Disposition of specimens: [facility] for stone analysis  Indication: Rhonda Higgins is a 45 y.o.   patient with urolithiasis. After reviewing the management options for treatment, the patient elected to proceed with the above surgical procedure(s). We have discussed the potential benefits and risks of the procedure, side effects of the proposed treatment, the likelihood of the patient achieving the goals of the procedure, and any potential problems that might occur during the procedure or recuperation. Informed consent has been obtained.  Description of procedure:  The patient was taken to the operating room and general anesthesia was induced.  The patient was placed in the dorsal lithotomy position, prepped and draped in the usual sterile fashion, and preoperative antibiotics were administered. A preoperative time-out was performed.   Cystourethroscopy was performed.  The patient's urethra was examined and was normal. The bladder was then systematically examined in its entirety. There was no evidence for any bladder tumors, stones, or other mucosal pathology.    Attention then turned to the left ureteral orifice and a ureteral catheter was used to intubate the ureteral orifice.  Omnipaque contrast was injected through the ureteral catheter and a retrograde  pyelogram was performed with findings as dictated above.  A 0.38 sensor guidewire was then advanced up the left ureter into the renal pelvis under fluoroscopic guidance. The 6 Fr semirigid ureteroscope was then advanced into the ureter next to the guidewire and the calculus was identified.   The stone was then fragmented with the 365 micron holmium laser fiber on a setting of 0.5J and frequency of 5 Hz.   All stones were then removed from the ureter with a zero tip nitinol basket.  Reinspection of the ureter revealed no remaining visible stones or fragments.   The wire was then backloaded through the cystoscope and a ureteral stent was advance over the wire using Seldinger technique.  The stent was positioned appropriately under fluoroscopic and cystoscopic guidance.  The wire was then removed with an adequate stent curl noted in the renal pelvis as well as in the bladder.  The bladder was then emptied and the procedure ended.  The patient appeared to tolerate the procedure well and without complications.  The patient was able to be awakened and transferred to the recovery unit in satisfactory condition.

## 2016-03-09 ENCOUNTER — Encounter (HOSPITAL_BASED_OUTPATIENT_CLINIC_OR_DEPARTMENT_OTHER): Payer: Self-pay | Admitting: Urology

## 2016-03-18 IMAGING — US US ABDOMEN COMPLETE
1 series · 14 of 25 positions shown · non-contrast
Comparison: [DATE]

CLINICAL DATA: Epigastric pain

EXAM:
ABDOMEN ULTRASOUND COMPLETE

[Series 1: us abdomen complete · 0.17mm/px · 14 of 99 slices shown]
[im 1/99]
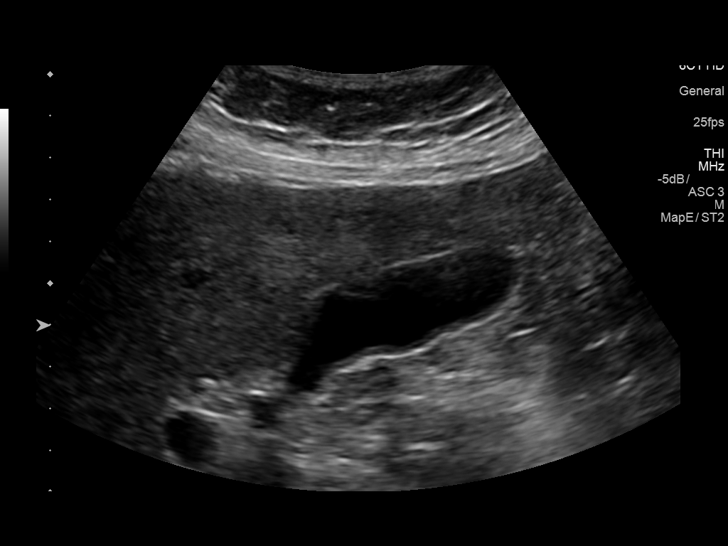
[im 9/99]
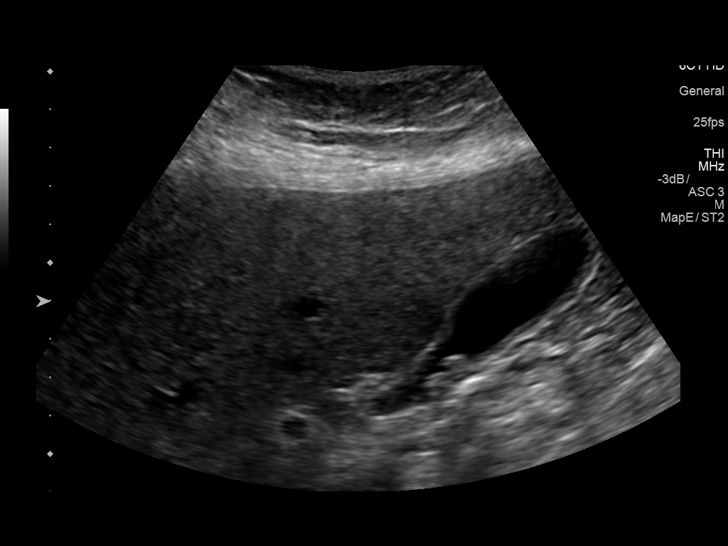
[im 17/99]
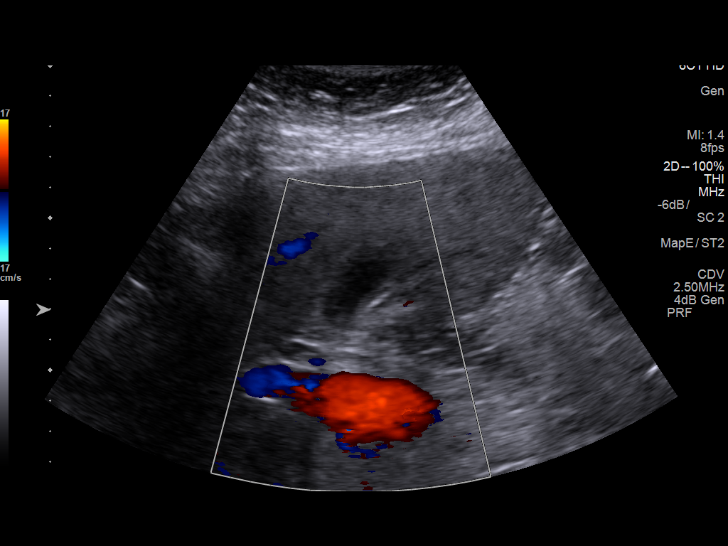
[im 25/99]
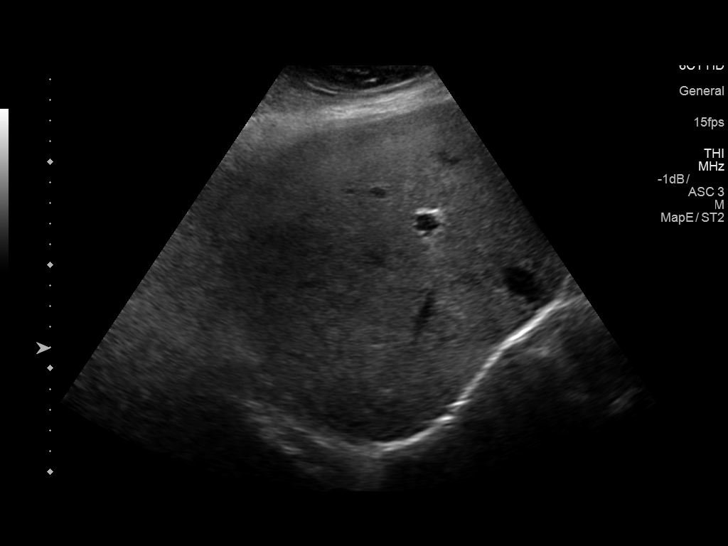
[im 33/99]
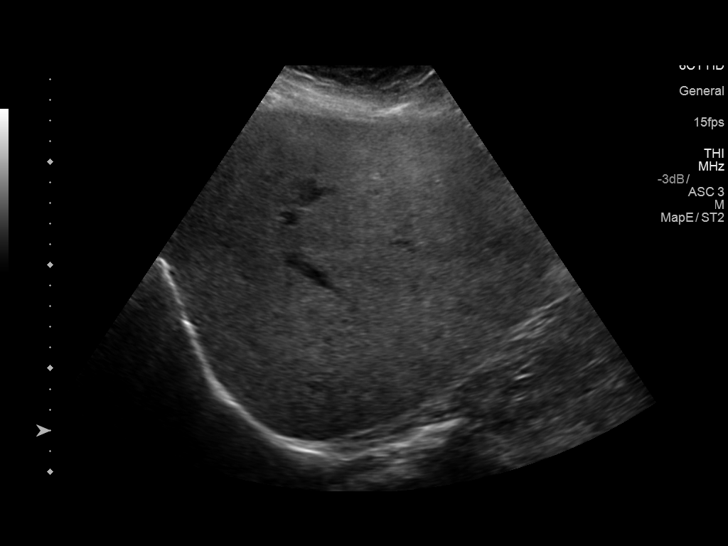
[im 37/99]
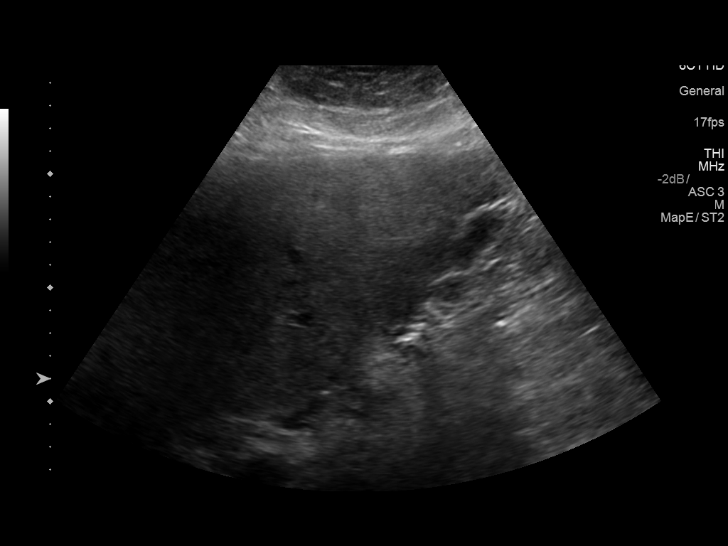
[im 45/99]
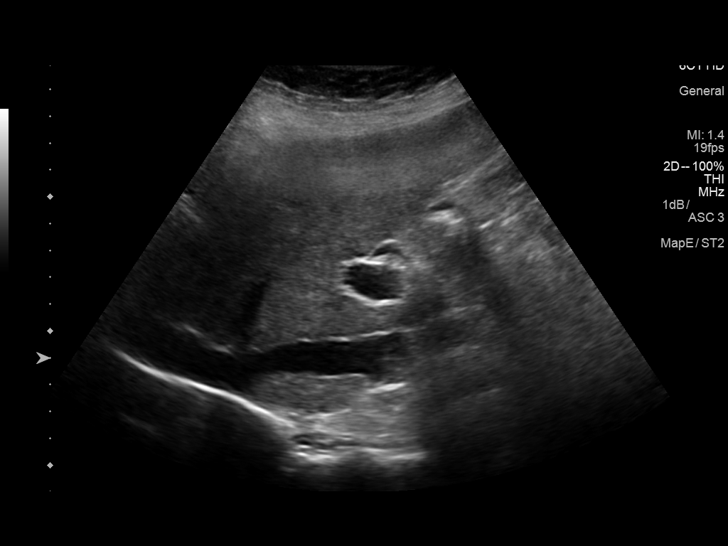
[im 54/99]
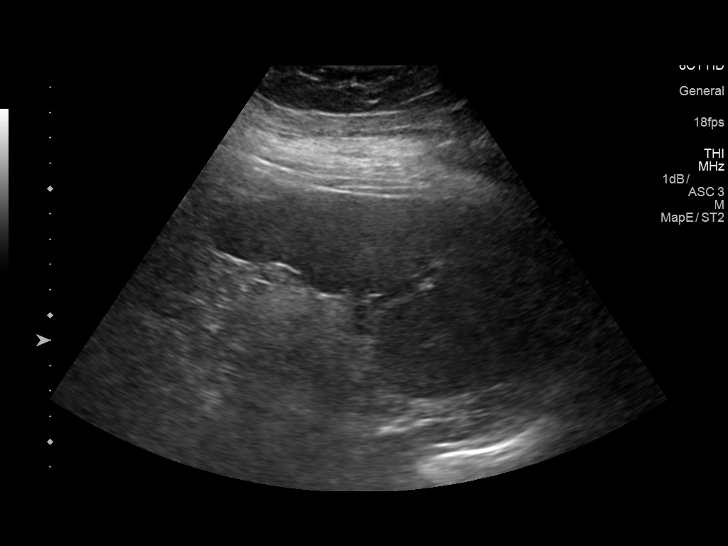
[im 62/99]
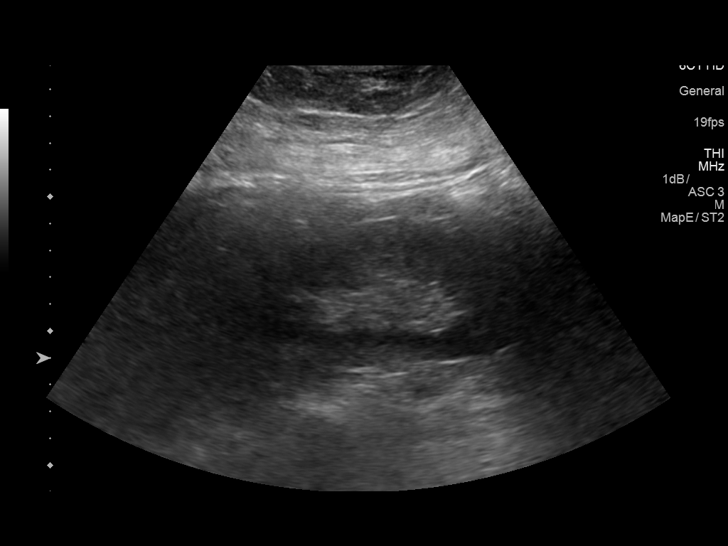
[im 66/99]
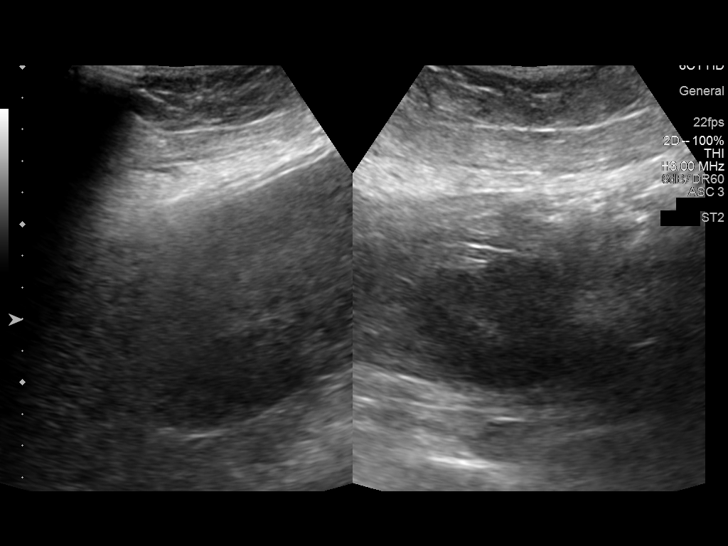
[im 74/99]
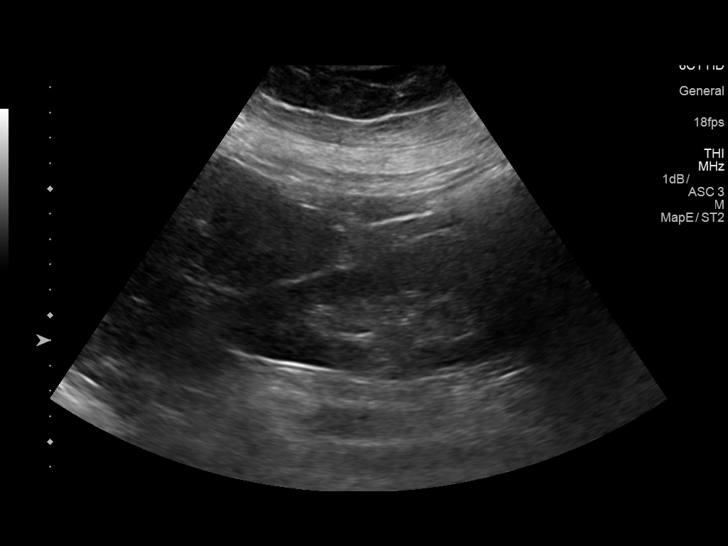
[im 82/99]
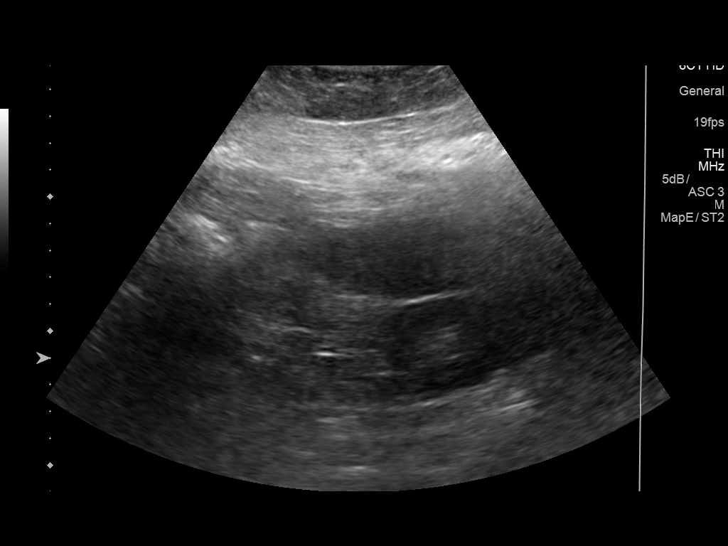
[im 90/99]
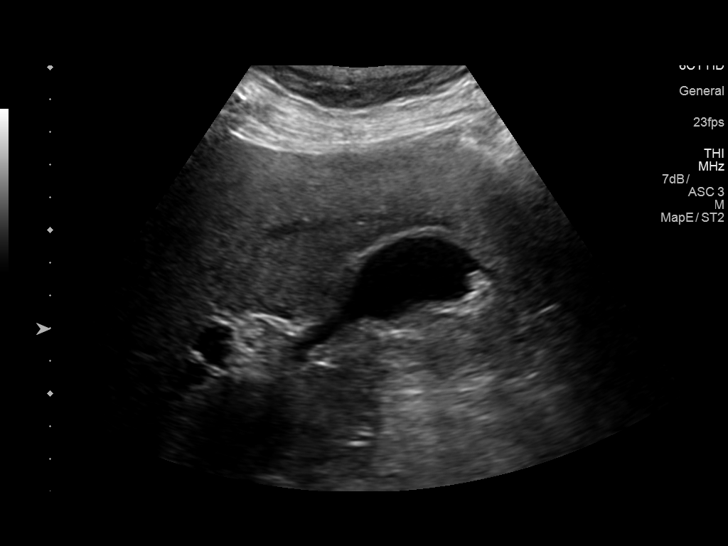
[im 99/99]
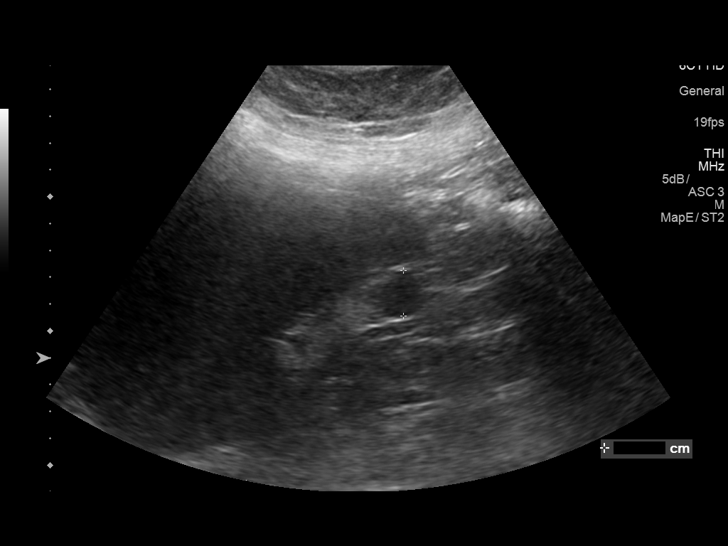

[14 of 25 positions shown; findings below may reference images not displayed]

FINDINGS: Gallbladder: No wall thickening visualized. Gallstones are
identified. No sonographic Murphy sign noted by sonographer.

Common bile duct: Diameter: 4.7 mm

Liver: No focal lesion identified. There is diffuse increased
echotexture of the liver. Portal vein is patent on color Doppler
imaging with normal direction of blood flow towards the liver.

IVC: No abnormality visualized.

Pancreas: Limited visualization due to overlying bowel gas.

Spleen: Size and appearance within normal limits.

Right Kidney: Length: 12.2 cm. Echogenicity within normal limits. No
mass or hydronephrosis visualized.

Left Kidney: Length: 13.7 cm. Echogenicity within normal limits. No
mass or hydronephrosis visualized.

Abdominal aorta: No aneurysm visualized.

Other findings: None.
IMPRESSION: Cholelithiasis without sonographic evidence of acute cholecystitis.

Fatty infiltration of liver.

## 2016-09-18 ENCOUNTER — Encounter (HOSPITAL_COMMUNITY): Payer: Self-pay | Admitting: Emergency Medicine

## 2016-09-18 ENCOUNTER — Emergency Department (HOSPITAL_COMMUNITY)
Admission: EM | Admit: 2016-09-18 | Discharge: 2016-09-18 | Disposition: A | Discharge status: Home / Self Care | Payer: Medicaid Other | Attending: Emergency Medicine | Admitting: Emergency Medicine

## 2016-09-18 ENCOUNTER — Emergency Department (HOSPITAL_COMMUNITY): Payer: Medicaid Other

## 2016-09-18 DIAGNOSIS — Z7984 Long term (current) use of oral hypoglycemic drugs: Secondary | ICD-10-CM | POA: Insufficient documentation

## 2016-09-18 DIAGNOSIS — E119 Type 2 diabetes mellitus without complications: Secondary | ICD-10-CM | POA: Insufficient documentation

## 2016-09-18 DIAGNOSIS — N200 Calculus of kidney: Secondary | ICD-10-CM | POA: Insufficient documentation

## 2016-09-18 DIAGNOSIS — E039 Hypothyroidism, unspecified: Secondary | ICD-10-CM | POA: Insufficient documentation

## 2016-09-18 DIAGNOSIS — Z7982 Long term (current) use of aspirin: Secondary | ICD-10-CM | POA: Insufficient documentation

## 2016-09-18 DIAGNOSIS — R109 Unspecified abdominal pain: Secondary | ICD-10-CM | POA: Diagnosis present

## 2016-09-18 DIAGNOSIS — Z79899 Other long term (current) drug therapy: Secondary | ICD-10-CM | POA: Diagnosis not present

## 2016-09-18 LAB — COMPREHENSIVE METABOLIC PANEL
ALT: 67 U/L — ABNORMAL HIGH (ref 14–54)
AST: 83 U/L — ABNORMAL HIGH (ref 15–41)
Albumin: 3.9 g/dL (ref 3.5–5.0)
Alkaline Phosphatase: 130 U/L — ABNORMAL HIGH (ref 38–126)
Anion gap: 8 (ref 5–15)
BUN: 13 mg/dL (ref 6–20)
CO2: 26 mmol/L (ref 22–32)
Calcium: 8.5 mg/dL — ABNORMAL LOW (ref 8.9–10.3)
Chloride: 105 mmol/L (ref 101–111)
Creatinine, Ser: 0.68 mg/dL (ref 0.44–1.00)
GFR calc Af Amer: >60 mL/min (ref >60)
GFR calc non Af Amer: >60 mL/min (ref >60)
Glucose, Bld: 233 mg/dL — ABNORMAL HIGH (ref 65–99)
Potassium: 3.5 mmol/L (ref 3.5–5.1)
Sodium: 139 mmol/L (ref 135–145)
Total Bilirubin: 0.6 mg/dL (ref 0.3–1.2)
Total Protein: 6.9 g/dL (ref 6.5–8.1)

## 2016-09-18 LAB — CBC WITH DIFFERENTIAL/PLATELET
Basophils Absolute: 0 10*3/uL (ref 0.0–0.1)
Basophils Relative: 0 %
Eosinophils Absolute: 0.2 10*3/uL (ref 0.0–0.7)
Eosinophils Relative: 3 %
HCT: 41.8 % (ref 36.0–46.0)
Hemoglobin: 14 g/dL (ref 12.0–15.0)
Lymphocytes Relative: 21 %
Lymphs Abs: 1.4 10*3/uL (ref 0.7–4.0)
MCH: 28.6 pg (ref 26.0–34.0)
MCHC: 33.5 g/dL (ref 30.0–36.0)
MCV: 85.3 fL (ref 78.0–100.0)
Monocytes Absolute: 0.4 10*3/uL (ref 0.1–1.0)
Monocytes Relative: 7 %
Neutro Abs: 4.4 10*3/uL (ref 1.7–7.7)
Neutrophils Relative %: 69 %
Platelets: 110 10*3/uL — ABNORMAL LOW (ref 150–400)
RBC: 4.9 MIL/uL (ref 3.87–5.11)
RDW: 13.4 % (ref 11.5–15.5)
WBC: 6.4 10*3/uL (ref 4.0–10.5)

## 2016-09-18 LAB — URINALYSIS, ROUTINE W REFLEX MICROSCOPIC
Bilirubin Urine: NEGATIVE
Glucose, UA: 50 mg/dL — AB
Hgb urine dipstick: NEGATIVE
Ketones, ur: NEGATIVE mg/dL
Leukocytes, UA: NEGATIVE
Nitrite: NEGATIVE
Protein, ur: NEGATIVE mg/dL
Specific Gravity, Urine: 1.03 (ref 1.005–1.030)
pH: 5 (ref 5.0–8.0)

## 2016-09-18 MED ORDER — IBUPROFEN 600 MG PO TABS: 600.0000 mg | ORAL_TABLET | Freq: Four times a day (QID) | ORAL | 0 refills | Status: DC | PRN

## 2016-09-18 MED ORDER — OXYCODONE-ACETAMINOPHEN 5-325 MG PO TABS: 1.0000 | ORAL_TABLET | ORAL | 0 refills | Status: DC | PRN

## 2016-09-18 NOTE — ED Provider Notes (Signed)
[facility] DEPT Provider Note   CSN: [id number] Arrival date & time: 09/18/16  0210     History   Chief Complaint Chief Complaint  Patient presents with  . Flank Pain    HPI Rhonda Higgins is a 46 y.o. female.  HPI Rhonda Higgins is a 46 y.o. female presents to ED with complaint of right flank and right sided abdominal pain. States symptoms began about 3 hrs prior to arrival. States pain is sharp, comes and goes, currently pain free. Denies any associated nausea, vomiting, diarrhea. No urinary symptoms. Has had kidney stones int he past, but states this is not as severe of a pain. Denies any injuries. Denies fever, chills, malaise. Did not take anything for pain prior to coming in.   Past Medical History:  Diagnosis Date  . Adenocarcinoma (HCC)    endometrial, FIGO GRADE 1  . Allergic rhinitis   . Depression   . GERD (gastroesophageal reflux disease)   . Hematuria   . History of endometrial cancer 08-02-2009   oncologist-  dr brewster/ Denman George and dr kinard/  no recurrence   endometrial adenocarinoma Stage 1B, Grade 1, FIGO--  s/p  TAH w/ BSO and pelvic lymph node dissection's and radiation therapy  . History of kidney stones   . History of radiation therapy    2011  pelvic intracavity brachytherapy treatment's for endometrial carcinoma  . History of thyroid nodule    multinodular goiter s/p  total thyroidectomy 11-19-2015  per pathology -  adenomatoid nodules  . Hyperlipidemia   . Hypothyroidism, postsurgical   . Left ureteral stone   . Non-insulin dependent type 2 diabetes mellitus ([place])   . OSA (obstructive sleep apnea)    severe OSA  per study 03-08-2010--  noncomplant cpap  . Overactive bladder   . Personality disorder   . Polyphagia(783.6)   . Right lower quadrant pain   . Urgency of urination   . Uterine cancer ([place])   . UTI (urinary tract infection)     Patient Active Problem List   Diagnosis Date Noted  . Multinodular goiter (nontoxic)  11/17/2015  . Chest pain 07/20/2014  . Diabetes mellitus without complication ([place]) 44/08/4740  . GERD (gastroesophageal reflux disease) 07/19/2014  . Personality disorder   . Uterine cancer ([place]) 06/22/2011  . DM 02/09/2010  . HLD (hyperlipidemia) 02/09/2010  . Obstructive sleep apnea 02/09/2010  . Allergic rhinitis 02/09/2010  . History of uterine cancer 02/09/2010    Past Surgical History:  Procedure Laterality Date  . CARDIOVASCULAR STRESS TEST  06/09/2008   normal nuclear study w/ no ischemia/  normal LV function and wall motion , ef 83%  . CYSTOSCOPY/RETROGRADE/URETEROSCOPY/STONE EXTRACTION WITH BASKET Left 03/08/2016   Procedure: CYSTOSCOPY/RETROGRADE/URETEROSCOPY/STONE EXTRACTION WITH BASKET, STENT PLACEMENT;  Surgeon: Rana Snare, MD;  Location: [facility];  Service: Urology;  Laterality: Left;  . ENDOMETRIAL BIOPSY    . HOLMIUM LASER APPLICATION Left 5/95/6387   Procedure: HOLMIUM LASER APPLICATION;  Surgeon: Rana Snare, MD;  Location: [facility];  Service: Urology;  Laterality: Left;  . MYRINGECOTMY W/ REMOVAL MIDDLE EAR CHOLESTEATOMA TYPE 1 FASICA TYMPANOPLASTY  09/13/2000  . ROBOTIC ASSISTED TOTAL HYSTERECTOMY WITH BILATERAL SALPINGO OOPHERECTOMY  08-02-2009   at [facility]  dr Denman George   w/  Bilateral pelvic and para aortic lymph node dissection's  . THYROIDECTOMY N/A 11/19/2015   Procedure: TOTAL THYROIDECTOMY;  Surgeon: Armandina Gemma, MD;  Location: [facility];  Service: General;  Laterality: N/A;  . TONSILLECTOMY  age 47  . TRANSTHORACIC ECHOCARDIOGRAM  07/19/2014   ef 55-60%/  trivial TR  . TYMPANOPLASTY Right 1993    OB History    Gravida Para Term Preterm AB Living   0             SAB TAB Ectopic Multiple Live Births                   Home Medications    Prior to Admission medications   Medication Sig Start Date End Date Taking? Authorizing Provider  aspirin EC 81 MG tablet Take 162 mg by mouth at bedtime.   Yes Historical Provider, MD   buPROPion (WELLBUTRIN XL) 150 MG 24 hr tablet Take 150 mg by mouth at bedtime.    Yes Historical Provider, MD  cetirizine (ZYRTEC) 10 MG tablet Take 10 mg by mouth at bedtime.    Yes Historical Provider, MD  gabapentin (NEURONTIN) 400 MG capsule Take 400-1,200 mg by mouth 3 (three) times daily. Pt takes one capsule in the morning, one in the afternoon, and three at bedtime.   Yes Historical Provider, MD  glimepiride (AMARYL) 4 MG tablet Take 4 mg by mouth 2 (two) times daily.    Yes Historical Provider, MD  ibuprofen (ADVIL,MOTRIN) 800 MG tablet Take 800 mg by mouth every 8 (eight) hours as needed for moderate pain.   Yes Historical Provider, MD  lamoTRIgine (LAMICTAL) 200 MG tablet Take 200 mg by mouth at bedtime.    Yes Historical Provider, MD  levothyroxine (SYNTHROID, LEVOTHROID) 200 MCG tablet Take 200 mcg by mouth daily before breakfast.   Yes Historical Provider, MD  lisinopril (PRINIVIL,ZESTRIL) 5 MG tablet Take 5 mg by mouth at bedtime.    Yes Historical Provider, MD  Multiple Vitamin (MULTIVITAMIN WITH MINERALS) TABS tablet Take 1 tablet by mouth at bedtime.    Yes Historical Provider, MD  pantoprazole (PROTONIX) 40 MG tablet Take 40 mg by mouth at bedtime.    Yes Historical Provider, MD  risperiDONE (RISPERDAL) 2 MG tablet Take 2 mg by mouth at bedtime. 08/22/16  Yes Historical Provider, MD  simvastatin (ZOCOR) 40 MG tablet Take 40 mg by mouth at bedtime.    Yes Historical Provider, MD  sitaGLIPtin-metformin (JANUMET) 50-1000 MG tablet Take 1 tablet by mouth 2 (two) times daily with a meal.   Yes Historical Provider, MD  calcium carbonate (OS-CAL - DOSED IN MG OF ELEMENTAL CALCIUM) 1250 (500 Ca) MG tablet Take 2 tablets (1,000 mg of elemental calcium total) by mouth 4 (four) times daily. Patient not taking: Reported on 03/08/2016 11/20/15   Armandina Gemma, MD  HYDROmorphone (DILAUDID) 2 MG tablet Take 1 tablet (2 mg total) by mouth every 4 (four) hours as needed for severe pain. Patient not  taking: Reported on 09/18/2016 02/14/16   Lacretia Leigh, MD  Meth-Hyo-M Bl-Na Phos-Ph Sal (URIBEL) 118 MG CAPS Take 1 capsule (118 mg total) by mouth 3 (three) times daily as needed. Patient not taking: Reported on 09/18/2016 03/08/16   Rana Snare, MD  ondansetron (ZOFRAN ODT) 8 MG disintegrating tablet Take 1 tablet (8 mg total) by mouth every 8 (eight) hours as needed for nausea or vomiting. Patient not taking: Reported on 09/18/2016 02/14/16   Lacretia Leigh, MD  SYNTHROID 100 MCG tablet Take 1 tablet (100 mcg total) by mouth daily. Patient not taking: Reported on 09/18/2016 11/20/15   Armandina Gemma, MD    Family History Family History  Problem Relation Age of Onset  . Heart  disease Sister   . Heart attack Brother   . Heart disease Brother   . Heart disease Sister   . Diabetes Sister   . Asthma Mother   . Heart disease Mother   . Diabetes Mother   . Emphysema Mother   . Hypertension Mother   . Stroke Mother   . Prostate cancer Father     Social History Social History  Substance Use Topics  . Smoking status: Never Smoker  . Smokeless tobacco: Never Used  . Alcohol use No     Allergies   Hydrocodone and Codeine   Review of Systems Review of Systems  Constitutional: Negative for chills and fever.  Respiratory: Negative for cough, chest tightness and shortness of breath.   Cardiovascular: Negative for chest pain, palpitations and leg swelling.  Gastrointestinal: Positive for abdominal pain. Negative for diarrhea, nausea and vomiting.  Genitourinary: Positive for flank pain. Negative for difficulty urinating, dysuria, hematuria, pelvic pain, vaginal bleeding, vaginal discharge and vaginal pain.  Musculoskeletal: Negative for arthralgias, myalgias, neck pain and neck stiffness.  Skin: Negative for rash.  Neurological: Negative for dizziness, weakness and headaches.  All other systems reviewed and are negative.    Physical Exam Updated Vital Signs BP 122/63 (BP Location: Left  Arm)   Pulse 80   Temp 98.4 F (36.9 C) (Oral)   Resp 18   Ht 5\' 3"  (1.6 m)   Wt 104.8 kg   SpO2 93%   BMI 40.92 kg/m   Physical Exam  Constitutional: She appears well-developed and well-nourished. No distress.  HENT:  Head: Normocephalic.  Eyes: Conjunctivae are normal.  Neck: Neck supple.  Cardiovascular: Normal rate, regular rhythm and normal heart sounds.   Pulmonary/Chest: Effort normal and breath sounds normal. No respiratory distress. She has no wheezes. She has no rales.  Abdominal: Soft. Bowel sounds are normal. She exhibits no distension. There is no tenderness. There is no rebound.  No CVA tenderness  Musculoskeletal: She exhibits no edema.  Neurological: She is alert.  Skin: Skin is warm and dry.  Psychiatric: She has a normal mood and affect. Her behavior is normal.  Nursing note and vitals reviewed.    ED Treatments / Results  Labs (all labs ordered are listed, but only abnormal results are displayed) Labs Reviewed  URINALYSIS, ROUTINE W REFLEX MICROSCOPIC - Abnormal; Notable for the following:       Result Value   Glucose, UA 50 (*)    All other components within normal limits  CBC WITH DIFFERENTIAL/PLATELET - Abnormal; Notable for the following:    Platelets 110 (*)    All other components within normal limits  COMPREHENSIVE METABOLIC PANEL - Abnormal; Notable for the following:    Glucose, Bld 233 (*)    Calcium 8.5 (*)    AST 83 (*)    ALT 67 (*)    Alkaline Phosphatase 130 (*)    All other components within normal limits    EKG  EKG Interpretation None       Radiology US Renal  Result Date: 09/18/2016 CLINICAL DATA:  RIGHT flank pain, assess for obstructive uropathy. History of kidney stones, uterine cancer. EXAM: RENAL / URINARY TRACT ULTRASOUND COMPLETE COMPARISON:  CT abdomen and pelvis March 02, 2016 FINDINGS: Right Kidney: Length: 10.7 cm. Echogenicity within normal limits. No mass or hydronephrosis visualized. 5 mm echogenic  interpolar focus most compatible with nonshadowing calculus. Left Kidney: Length: 11.9 cm. Echogenicity within normal limits. No mass or hydronephrosis visualized. Bladder: Appears  normal for degree of bladder distention. IMPRESSION: 5 mm suspected nonobstructing RIGHT nephrolithiasis. Electronically Signed   By: Elon Alas M.D.   On: 09/18/2016 04:32    Procedures Procedures (including critical care time)  Medications Ordered in ED Medications - No data to display   Initial Impression / Assessment and Plan / ED Course  I have reviewed the triage vital signs and the nursing notes.  Pertinent labs & imaging results that were available during my care of the patient were reviewed by me and considered in my medical decision making (see chart for details).    Pt with right sided flank pain radiating into right side. History suspicious for renal colic, however exam unremarkable and pt is asymptomatic at this time. Lab work show mildly elevated LFTs, pt has hx of the same and she is aware of it. UA unremarkable. Glucose elevatd pt aware. Will get [place] to ro hydronephrosis.   4:56 AM Pt still symptom free. US showing suspected 35mm stone in right kidney, non obstructing. Given resolution of her symptoms, question if she may have passed a stone versus if this renal stone is intermittently symptomatic. Patient appears to be comfortable. Vital signs are normal. She is stable for discharge home at this time. Her abdomen is benign, do not think she needs any further imaging in ED. I do not think she has a surgical abdomen. We'll discharge home with pain medication for severe pain medication starts passing the stone. She will follow-up with Dr. Jocelyn Lamer.   Vitals:   09/18/16 0214 09/18/16 0438  BP: 127/65 122/63  Pulse: 87 80  Resp: 18 18  Temp: 98.4 F (36.9 C)   TempSrc: Oral   SpO2: 94% 93%  Weight: 104.8 kg   Height: 5\' 3"  (1.6 m)      Final Clinical Impressions(s) / ED Diagnoses   Final  diagnoses:  Kidney stone    New Prescriptions New Prescriptions   IBUPROFEN (ADVIL,MOTRIN) 600 MG TABLET    Take 1 tablet (600 mg total) by mouth every 6 (six) hours as needed.   OXYCODONE-ACETAMINOPHEN (PERCOCET) 5-325 MG TABLET    Take 1 tablet by mouth every 4 (four) hours as needed for severe pain.     Jeannett Senior, PA-C 09/18/16 [place], MD 09/18/16 [phone]

## 2016-09-18 NOTE — ED Triage Notes (Addendum)
Patient complaining of pain on right flank. The pain started about 6 pm 09/17/2016. No injury to self. Patient states that the pain comes and goes. It is a 6/10 at it worst. Patient has a hx of kidney stones.

## 2016-09-18 NOTE — Discharge Instructions (Signed)
Take ibuprofen for pain. Percocet for severe pain. Follow up with Dr. Risa Grill for further evaluation. Return if worsening symptoms.

## 2016-09-18 NOTE — ED Notes (Addendum)
Pt c/o right flank pain onset of 18:00 radiating towards the right hip, has not taken any medication. 6/10 pain with activity, intermittent pain when sitting. Denies dysuria.

## 2016-11-18 ENCOUNTER — Emergency Department (HOSPITAL_COMMUNITY): Payer: Medicaid Other

## 2016-11-18 ENCOUNTER — Encounter (HOSPITAL_COMMUNITY): Payer: Self-pay | Admitting: Oncology

## 2016-11-18 ENCOUNTER — Emergency Department (HOSPITAL_COMMUNITY)
Admission: EM | Admit: 2016-11-18 | Discharge: 2016-11-19 | Disposition: A | Discharge status: Home / Self Care | Payer: Medicaid Other | Attending: Emergency Medicine | Admitting: Emergency Medicine

## 2016-11-18 DIAGNOSIS — E119 Type 2 diabetes mellitus without complications: Secondary | ICD-10-CM | POA: Insufficient documentation

## 2016-11-18 DIAGNOSIS — Z7982 Long term (current) use of aspirin: Secondary | ICD-10-CM | POA: Insufficient documentation

## 2016-11-18 DIAGNOSIS — Z79899 Other long term (current) drug therapy: Secondary | ICD-10-CM | POA: Insufficient documentation

## 2016-11-18 DIAGNOSIS — Z7984 Long term (current) use of oral hypoglycemic drugs: Secondary | ICD-10-CM | POA: Diagnosis not present

## 2016-11-18 DIAGNOSIS — E039 Hypothyroidism, unspecified: Secondary | ICD-10-CM | POA: Diagnosis not present

## 2016-11-18 DIAGNOSIS — R0789 Other chest pain: Secondary | ICD-10-CM | POA: Diagnosis not present

## 2016-11-18 DIAGNOSIS — R079 Chest pain, unspecified: Secondary | ICD-10-CM | POA: Diagnosis present

## 2016-11-18 LAB — BASIC METABOLIC PANEL
Anion gap: 10 (ref 5–15)
BUN: 14 mg/dL (ref 6–20)
CO2: 26 mmol/L (ref 22–32)
Calcium: 8.6 mg/dL — ABNORMAL LOW (ref 8.9–10.3)
Chloride: 103 mmol/L (ref 101–111)
Creatinine, Ser: 0.82 mg/dL (ref 0.44–1.00)
GFR calc Af Amer: >60 mL/min (ref >60)
GFR calc non Af Amer: >60 mL/min (ref >60)
Glucose, Bld: 147 mg/dL — ABNORMAL HIGH (ref 65–99)
Potassium: 3.7 mmol/L (ref 3.5–5.1)
Sodium: 139 mmol/L (ref 135–145)

## 2016-11-18 LAB — CBC
HCT: 41.9 % (ref 36.0–46.0)
Hemoglobin: 13.8 g/dL (ref 12.0–15.0)
MCH: 27.5 pg (ref 26.0–34.0)
MCHC: 32.9 g/dL (ref 30.0–36.0)
MCV: 83.6 fL (ref 78.0–100.0)
Platelets: 107 10*3/uL — ABNORMAL LOW (ref 150–400)
RBC: 5.01 MIL/uL (ref 3.87–5.11)
RDW: 13.6 % (ref 11.5–15.5)
WBC: 5.4 10*3/uL (ref 4.0–10.5)

## 2016-11-18 LAB — POCT I-STAT TROPONIN I: Troponin i, poc: 0 ng/mL (ref 0.00–0.08)

## 2016-11-18 IMAGING — CR DG CHEST 2V
2 series · 2 of 2 positions shown · non-contrast
Comparison: [DATE]

CLINICAL DATA: Substernal chest pain

EXAM:
CHEST  2 VIEW

[w chest pa]
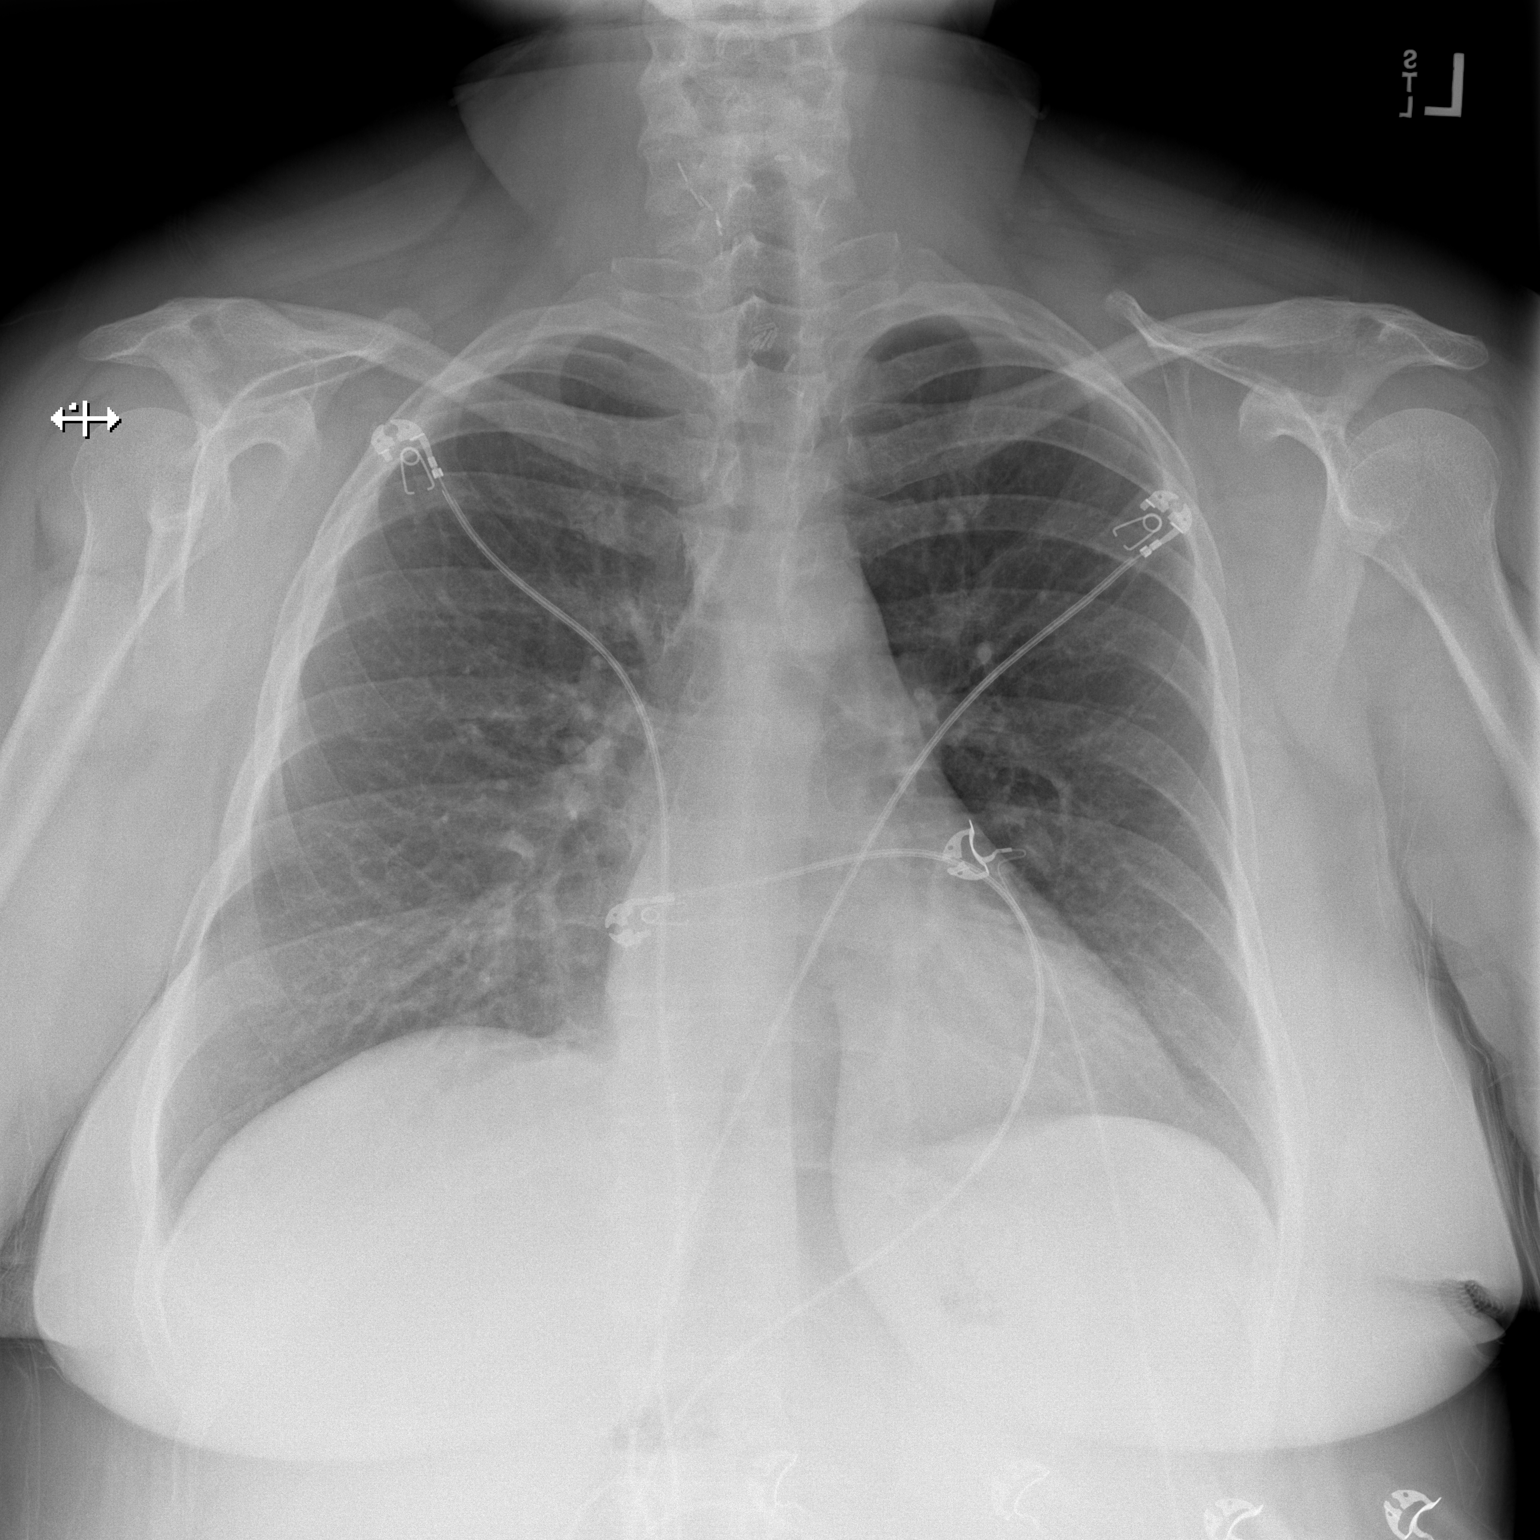

[w chest lat]
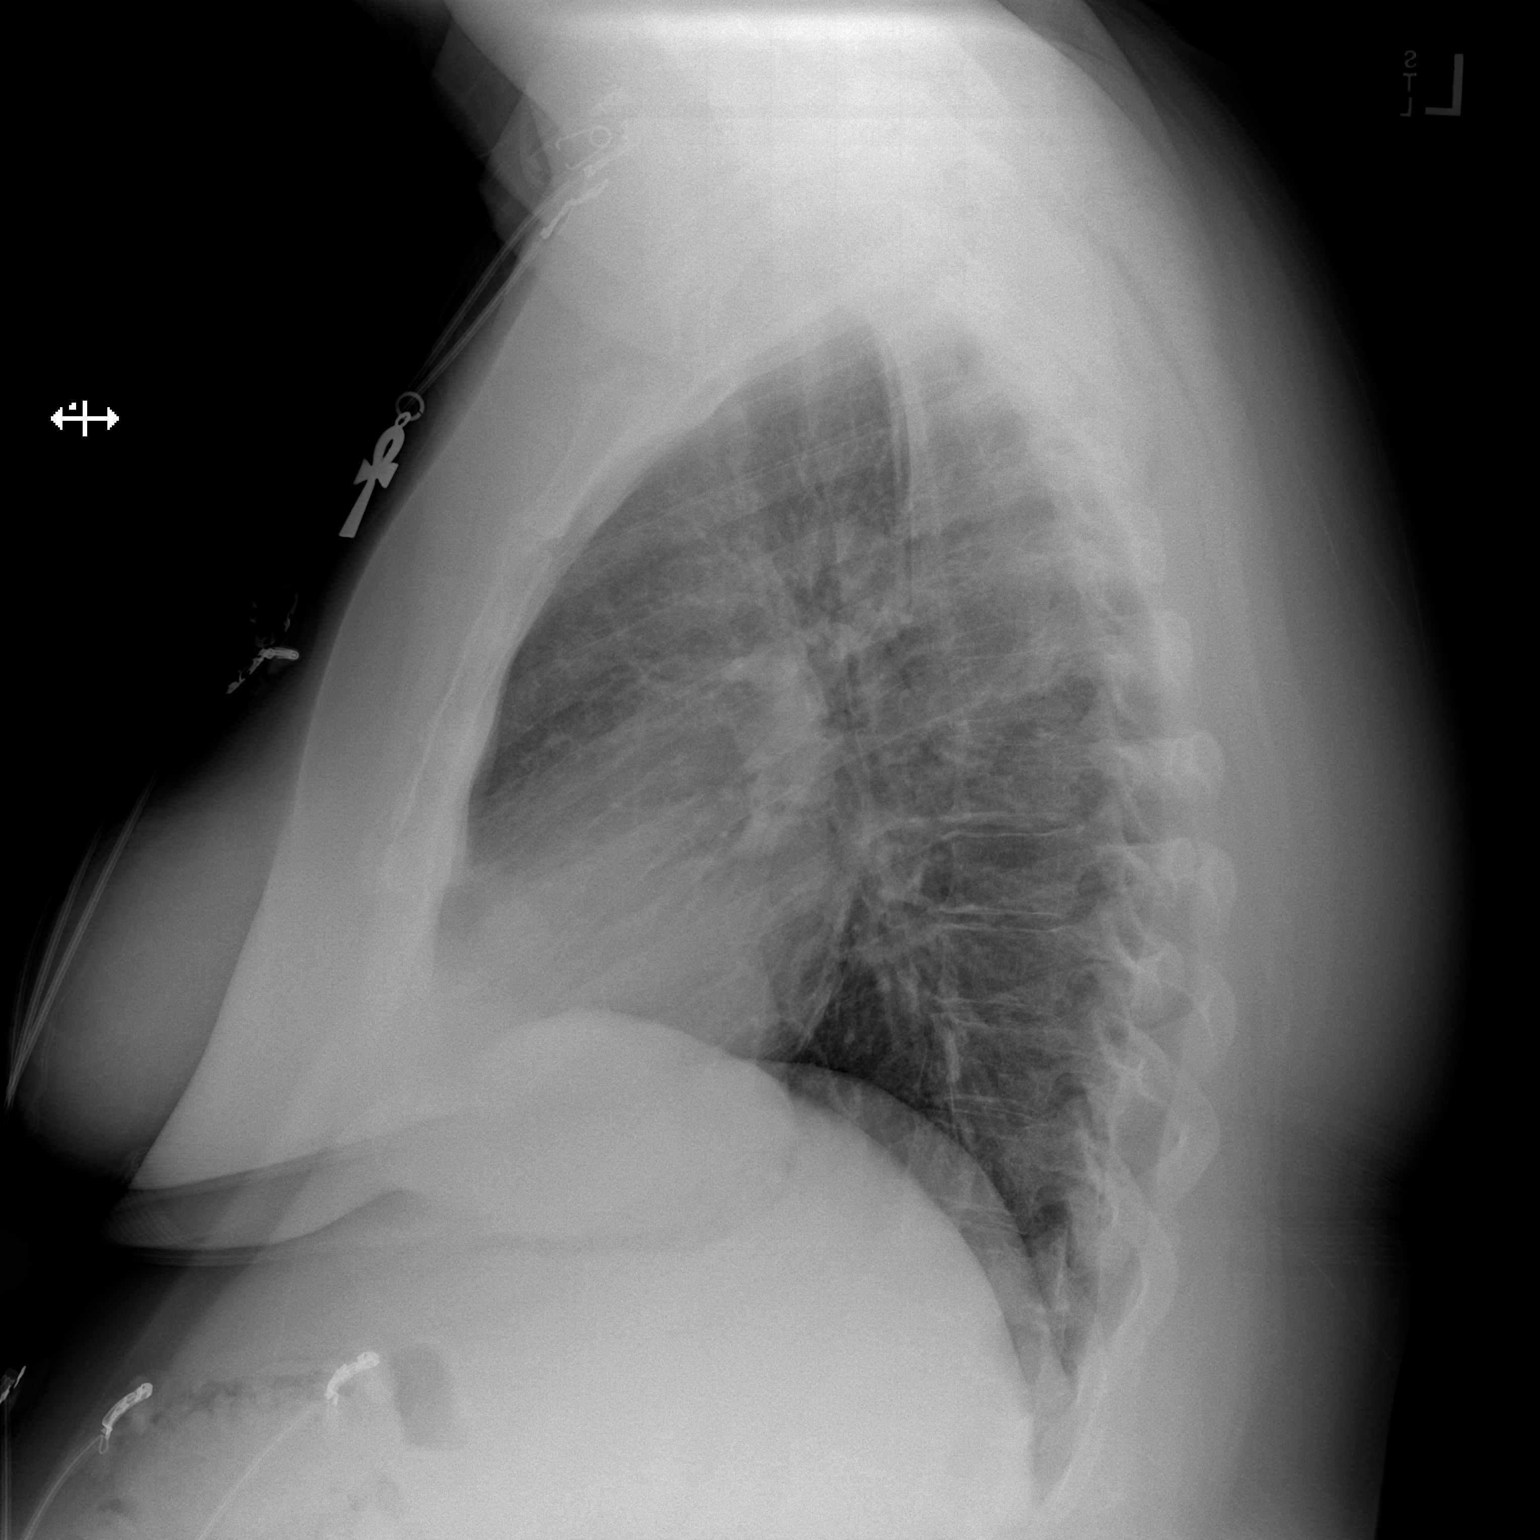

[2 of 2 positions shown; findings below may reference images not displayed]

FINDINGS: The heart size and mediastinal contours are within normal limits.
Both lungs are clear. The visualized skeletal structures are
unremarkable. Postsurgical changes consistent with prior
thyroidectomy.
IMPRESSION: No active cardiopulmonary disease.

## 2016-11-18 MED ORDER — GI COCKTAIL ~~LOC~~
30.0000 mL | Freq: Once | ORAL | Status: AC
  Administered 2016-11-18: 30 mL via ORAL
  Filled 2016-11-18: qty 30

## 2016-11-18 MED ORDER — PREDNISONE 20 MG PO TABS: 40.0000 mg | ORAL_TABLET | Freq: Every day | ORAL | 0 refills | Status: DC

## 2016-11-18 MED ORDER — PREDNISONE 20 MG PO TABS
60.0000 mg | ORAL_TABLET | ORAL | Status: AC
  Administered 2016-11-19: 60 mg via ORAL
  Filled 2016-11-18: qty 3

## 2016-11-18 NOTE — ED Triage Notes (Signed)
Pt c/o substernal CP w/ radiation to her back since last night at approximately 1800.  Pt states family has an extensive cardiac hx.  Pt rates pain 6/10, pressure in nature.

## 2016-11-18 NOTE — Discharge Instructions (Signed)
As discussed, your evaluation today has been largely reassuring.  But, it is important that you monitor your condition carefully, and do not hesitate to return to the ED if you develop new, or concerning changes in your condition. ? ?Otherwise, please follow-up with your physician for appropriate ongoing care. ? ?

## 2016-11-18 NOTE — ED Provider Notes (Signed)
[facility] DEPT Provider Note   CSN: [id number] Arrival date & time: 11/18/16  2011     History   Chief Complaint Chief Complaint  Patient presents with  . Chest Pain    HPI Rhonda Higgins is a 46 y.o. female.  HPI  Patient presents with substernal chest pain. Pain began yesterday, approximately 24 hours ago. Since onset the pain has been persistent. Pain is nonradiating, sharp, burning, persistent. No new dyspnea, fever, cough. Patient acknowledges a history of GERD, states that this is distinct from that. She also has a notable history of diabetes, started insulin 2 weeks ago. She notes that her blood sugar readings are typically in the 100s since onset.  Past Medical History:  Diagnosis Date  . Adenocarcinoma (HCC)    endometrial, FIGO GRADE 1  . Allergic rhinitis   . Depression   . GERD (gastroesophageal reflux disease)   . Hematuria   . History of endometrial cancer 08-02-2009   oncologist-  dr brewster/ Denman George and dr kinard/  no recurrence   endometrial adenocarinoma Stage 1B, Grade 1, FIGO--  s/p  TAH w/ BSO and pelvic lymph node dissection's and radiation therapy  . History of kidney stones   . History of radiation therapy    2011  pelvic intracavity brachytherapy treatment's for endometrial carcinoma  . History of thyroid nodule    multinodular goiter s/p  total thyroidectomy 11-19-2015  per pathology -  adenomatoid nodules  . Hyperlipidemia   . Hypothyroidism, postsurgical   . Left ureteral stone   . Non-insulin dependent type 2 diabetes mellitus ([place])   . OSA (obstructive sleep apnea)    severe OSA  per study 03-08-2010--  noncomplant cpap  . Overactive bladder   . Personality disorder   . Polyphagia(783.6)   . Right lower quadrant pain   . Urgency of urination   . Uterine cancer ([place])   . UTI (urinary tract infection)     Patient Active Problem List   Diagnosis Date Noted  . Multinodular goiter (nontoxic) 11/17/2015  . Chest pain  07/20/2014  . Diabetes mellitus without complication ([place]) 93/79/0240  . GERD (gastroesophageal reflux disease) 07/19/2014  . Personality disorder   . Uterine cancer ([place]) 06/22/2011  . DM 02/09/2010  . HLD (hyperlipidemia) 02/09/2010  . Obstructive sleep apnea 02/09/2010  . Allergic rhinitis 02/09/2010  . History of uterine cancer 02/09/2010    Past Surgical History:  Procedure Laterality Date  . CARDIOVASCULAR STRESS TEST  06/09/2008   normal nuclear study w/ no ischemia/  normal LV function and wall motion , ef 83%  . CYSTOSCOPY/RETROGRADE/URETEROSCOPY/STONE EXTRACTION WITH BASKET Left 03/08/2016   Procedure: CYSTOSCOPY/RETROGRADE/URETEROSCOPY/STONE EXTRACTION WITH BASKET, STENT PLACEMENT;  Surgeon: Rana Snare, MD;  Location: [facility];  Service: Urology;  Laterality: Left;  . ENDOMETRIAL BIOPSY    . HOLMIUM LASER APPLICATION Left 9/73/5329   Procedure: HOLMIUM LASER APPLICATION;  Surgeon: Rana Snare, MD;  Location[facility];  Service: Urology;  Laterality: Left;  . MYRINGECOTMY W/ REMOVAL MIDDLE EAR CHOLESTEATOMA TYPE 1 FASICA TYMPANOPLASTY  09/13/2000  . ROBOTIC ASSISTED TOTAL HYSTERECTOMY WITH BILATERAL SALPINGO OOPHERECTOMY  08-02-2009   at [facility]  dr Denman George   w/  Bilateral pelvic and para aortic lymph node dissection's  . THYROIDECTOMY N/A 11/19/2015   Procedure: TOTAL THYROIDECTOMY;  Surgeon: Armandina Gemma, MD;  Location: [facility];  Service: General;  Laterality: N/A;  . TONSILLECTOMY  age 35  . TRANSTHORACIC ECHOCARDIOGRAM  07/19/2014   ef 55-60%/  trivial TR  . TYMPANOPLASTY Right 1993    OB History    Gravida Para Term Preterm AB Living   0             SAB TAB Ectopic Multiple Live Births                   Home Medications    Prior to Admission medications   Medication Sig Start Date End Date Taking? Authorizing Provider  aspirin EC 81 MG tablet Take 162 mg by mouth at bedtime.    [provider]  buPROPion (WELLBUTRIN XL)  150 MG 24 hr tablet Take 150 mg by mouth at bedtime.     [provider]  calcium carbonate (OS-CAL - DOSED IN MG OF ELEMENTAL CALCIUM) 1250 (500 Ca) MG tablet Take 2 tablets (1,000 mg of elemental calcium total) by mouth 4 (four) times daily. Patient not taking: Reported on 03/08/2016 11/20/15   Armandina Gemma, MD  cetirizine (ZYRTEC) 10 MG tablet Take 10 mg by mouth at bedtime.     [provider]  gabapentin (NEURONTIN) 400 MG capsule Take 400-1,200 mg by mouth 3 (three) times daily. Pt takes one capsule in the morning, one in the afternoon, and three at bedtime.    [provider]  glimepiride (AMARYL) 4 MG tablet Take 4 mg by mouth 2 (two) times daily.     [provider]  HYDROmorphone (DILAUDID) 2 MG tablet Take 1 tablet (2 mg total) by mouth every 4 (four) hours as needed for severe pain. Patient not taking: Reported on 09/18/2016 02/14/16   Lacretia Leigh, MD  ibuprofen (ADVIL,MOTRIN) 600 MG tablet Take 1 tablet (600 mg total) by mouth every 6 (six) hours as needed. 09/18/16   Kirichenko, Tatyana, PA-C  ibuprofen (ADVIL,MOTRIN) 800 MG tablet Take 800 mg by mouth every 8 (eight) hours as needed for moderate pain.    [provider]  lamoTRIgine (LAMICTAL) 200 MG tablet Take 200 mg by mouth at bedtime.     [provider]  levothyroxine (SYNTHROID, LEVOTHROID) 200 MCG tablet Take 200 mcg by mouth daily before breakfast.    [provider]  lisinopril (PRINIVIL,ZESTRIL) 5 MG tablet Take 5 mg by mouth at bedtime.     [provider]  Meth-Hyo-M Bl-Na Phos-Ph Sal (URIBEL) 118 MG CAPS Take 1 capsule (118 mg total) by mouth 3 (three) times daily as needed. Patient not taking: Reported on 09/18/2016 03/08/16   Rana Snare, MD  Multiple Vitamin (MULTIVITAMIN WITH MINERALS) TABS tablet Take 1 tablet by mouth at bedtime.     [provider]  ondansetron (ZOFRAN ODT) 8 MG disintegrating tablet Take 1 tablet (8 mg total) by mouth every  8 (eight) hours as needed for nausea or vomiting. Patient not taking: Reported on 09/18/2016 02/14/16   Lacretia Leigh, MD  oxyCODONE-acetaminophen (PERCOCET) 5-325 MG tablet Take 1 tablet by mouth every 4 (four) hours as needed for severe pain. 09/18/16   Kirichenko, Tatyana, PA-C  pantoprazole (PROTONIX) 40 MG tablet Take 40 mg by mouth at bedtime.     [provider]  risperiDONE (RISPERDAL) 2 MG tablet Take 2 mg by mouth at bedtime. 08/22/16   [provider]  simvastatin (ZOCOR) 40 MG tablet Take 40 mg by mouth at bedtime.     [provider]  sitaGLIPtin-metformin (JANUMET) 50-1000 MG tablet Take 1 tablet by mouth 2 (two) times daily with a meal.    [provider]  SYNTHROID 100  MCG tablet Take 1 tablet (100 mcg total) by mouth daily. Patient not taking: Reported on 09/18/2016 11/20/15   Armandina Gemma, MD    Family History Family History  Problem Relation Age of Onset  . Heart disease Sister   . Heart attack Brother   . Heart disease Brother   . Heart disease Sister   . Diabetes Sister   . Asthma Mother   . Heart disease Mother   . Diabetes Mother   . Emphysema Mother   . Hypertension Mother   . Stroke Mother   . Prostate cancer Father     Social History Social History  Substance Use Topics  . Smoking status: Never Smoker  . Smokeless tobacco: Never Used  . Alcohol use No     Allergies   Hydrocodone and Codeine   Review of Systems Review of Systems  Constitutional:       Per HPI, otherwise negative  HENT:       Per HPI, otherwise negative  Respiratory:       Per HPI, otherwise negative  Cardiovascular:       Per HPI, otherwise negative  Gastrointestinal: Negative for vomiting.  Endocrine:       Negative aside from HPI  Genitourinary:       Neg aside from HPI   Musculoskeletal:       Per HPI, otherwise negative  Skin: Negative.   Neurological: Negative for syncope.     Physical Exam Updated Vital Signs BP 123/70 (BP  Location: Left Arm)   Pulse 85   Temp 98.2 F (36.8 C) (Oral)   Resp (!) 21   Ht 5\' 3"  (1.6 m)   Wt 103.9 kg (229 lb)   SpO2 97%   BMI 40.57 kg/m   Physical Exam  Constitutional: She is oriented to person, place, and time. She appears well-developed and well-nourished. No distress.  Obese F sitting upright, speaking clearly.  HENT:  Head: Normocephalic and atraumatic.  Eyes: Conjunctivae and EOM are normal.  Cardiovascular: Normal rate and regular rhythm.   Pulmonary/Chest: Effort normal and breath sounds normal. No stridor. No respiratory distress.  No CW  TTP  Abdominal: She exhibits no distension.  Musculoskeletal: She exhibits no edema.  Neurological: She is alert and oriented to person, place, and time. No cranial nerve deficit.  Skin: Skin is warm and dry.  Multiple tattoos  Psychiatric: She has a normal mood and affect.  Nursing note and vitals reviewed.    ED Treatments / Results  Labs (all labs ordered are listed, but only abnormal results are displayed) Labs Reviewed  BASIC METABOLIC PANEL - Abnormal; Notable for the following:       Result Value   Glucose, Bld 147 (*)    Calcium 8.6 (*)    All other components within normal limits  CBC - Abnormal; Notable for the following:    Platelets 107 (*)    All other components within normal limits  I-STAT TROPOININ, ED  POCT I-STAT TROPONIN I    EKG  EKG Interpretation  Date/Time:  Saturday November 18 2016 20:22:39 EDT Ventricular Rate:  87 PR Interval:    QRS Duration: 91 QT Interval:  393 QTC Calculation: 473 R Axis:   85 Text Interpretation:  Sinus rhythm Low voltage, precordial leads Otherwise within normal limits Confirmed by Carmin Muskrat [phone]) on 11/18/2016 10:12:22 PM       Radiology Dg Chest 2 View  Result Date: 11/18/2016 CLINICAL DATA:  Substernal chest pain  EXAM: CHEST  2 VIEW COMPARISON:  11/11/2015 FINDINGS: The heart size and mediastinal contours are within normal limits. Both lungs are  clear. The visualized skeletal structures are unremarkable. Postsurgical changes consistent with prior thyroidectomy. IMPRESSION: No active cardiopulmonary disease. Electronically Signed   By: Inez Catalina M.D.   On: 11/18/2016 20:35    Procedures Procedures (including critical care time)  Medications Ordered in ED Medications  gi cocktail (Maalox,Lidocaine,Donnatal) (30 mLs Oral Given 11/18/16 2221)     Initial Impression / Assessment and Plan / ED Course  I have reviewed the triage vital signs and the nursing notes.  Pertinent labs & imaging results that were available during my care of the patient were reviewed by me and considered in my medical decision making (see chart for details).  Now, on repeat exam the patient is awake and alert, in no distress, vital signs remained unremarkable, sinus rhythm, rate 75, unremarkable We reviewed the findings including reassuring labs, EKG. Patient has had pain for greater than 24 hours, and with a unremarkable troponin value, nonischemic EKG, and minimal risk factors, there is no evidence for ongoing coronary ischemia. Patient is no evidence for pneumonia, no cough, no URI like symptoms suggesting infection. Some suspicion for musculoskeletal etiology given the patient's description of pain, but no dyspnea, no fever, no cough, no pleurisy. Patient structure course of steroids, follow up with primary care in the coming days. She expressed understanding of the need for close return precautions.  Final Clinical Impressions(s) / ED Diagnoses  Atypical chest pain   Carmin Muskrat, MD 11/18/16 2317

## 2016-12-23 IMAGING — US US ABDOMEN LIMITED
1 series · 14 of 25 positions shown · non-contrast
Comparison: CT abdomen pelvis [DATE] and abdominal ultrasound
[DATE].

CLINICAL DATA: Fatty liver, cirrhosis.

EXAM:
ULTRASOUND ABDOMEN LIMITED RIGHT UPPER QUADRANT

[Series 1: us abdomen limited · 0.30mm/px · 14 of 27 slices shown]
[im 1/27]
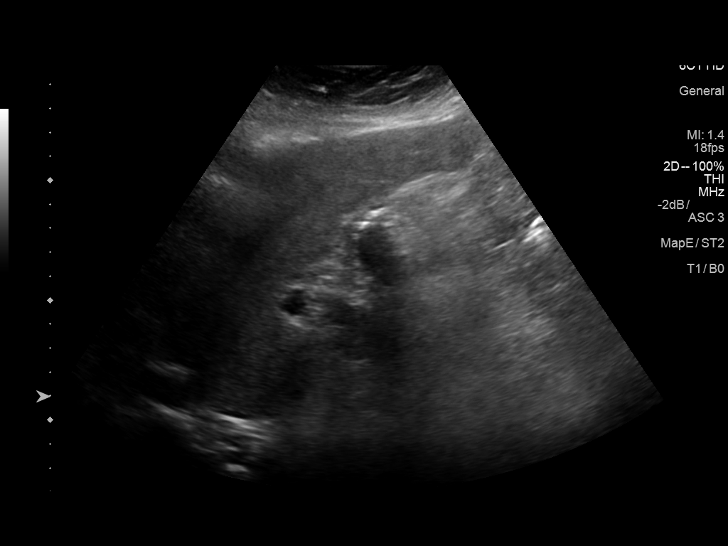
[im 3/27]
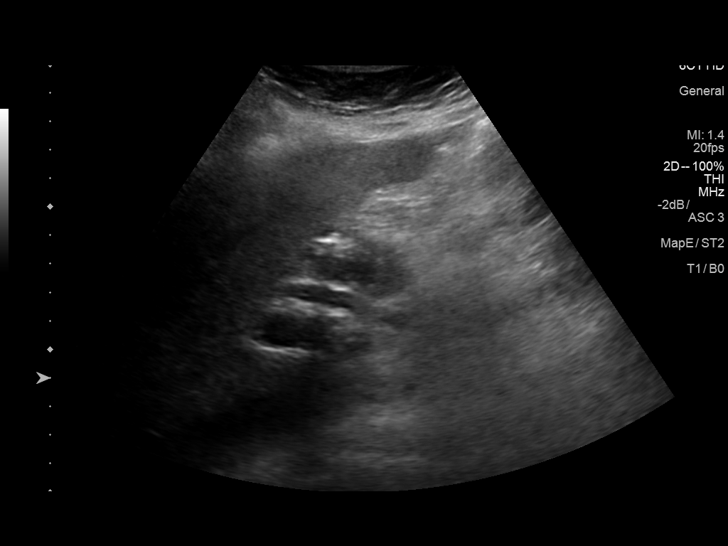
[im 5/27]
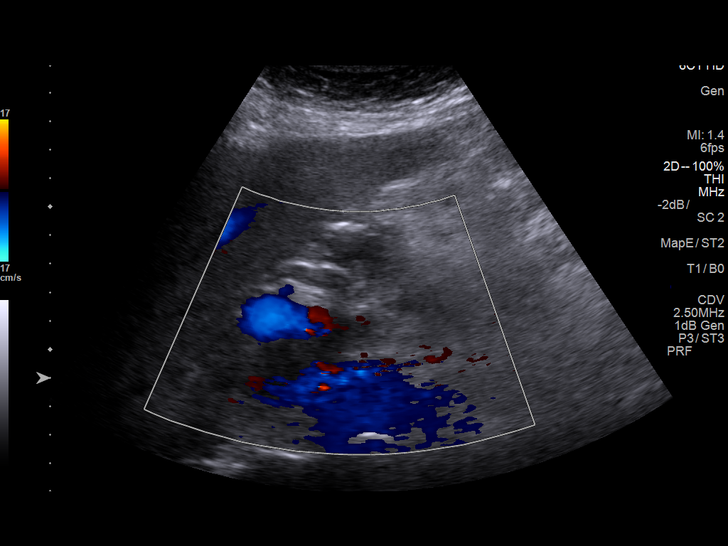
[im 7/27]
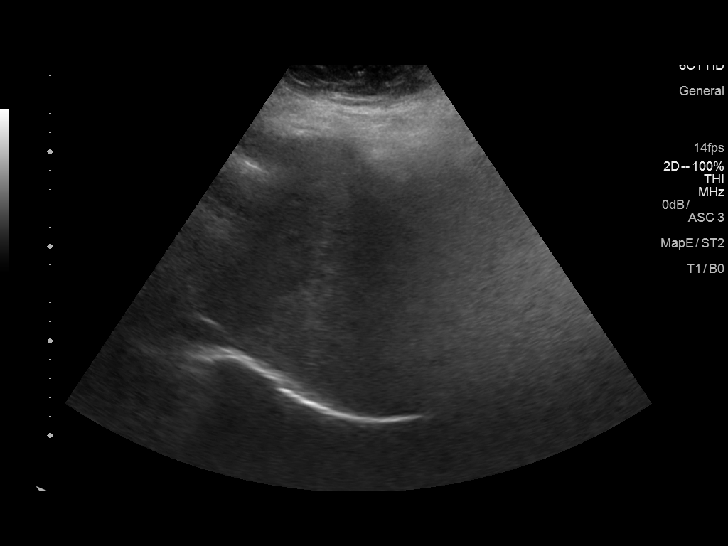
[im 9/27]
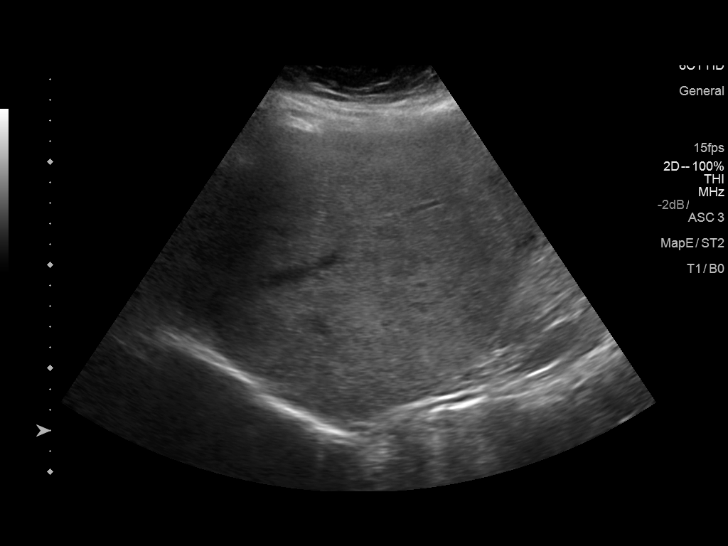
[im 10/27]
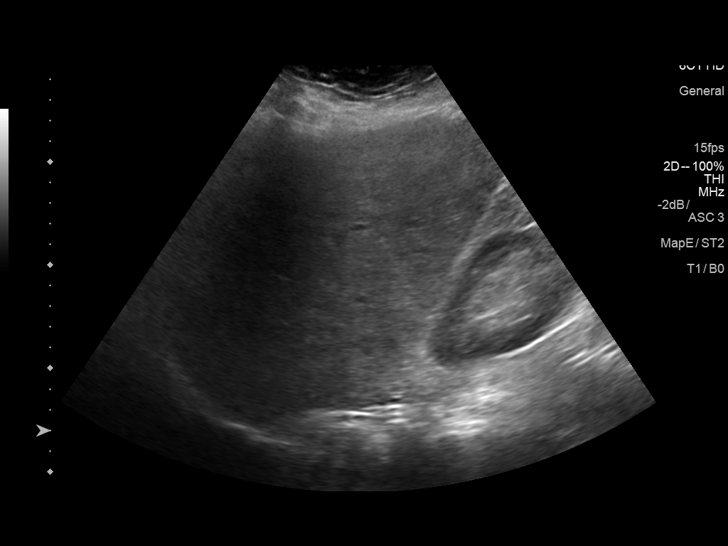
[im 12/27]
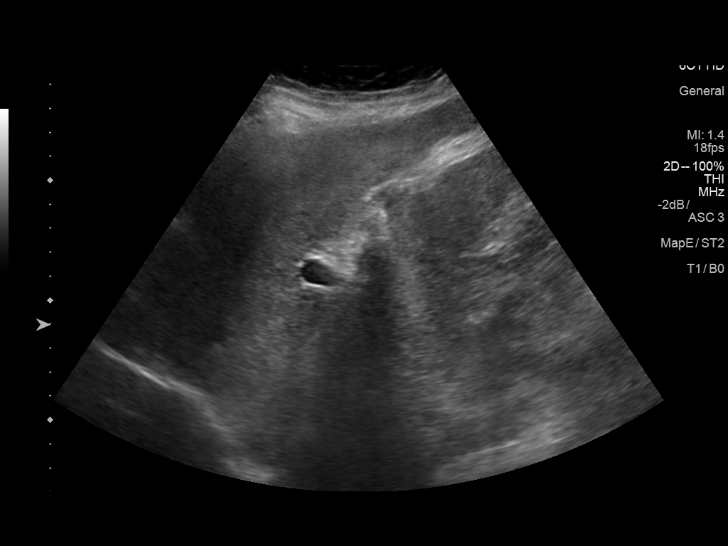
[im 15/27]
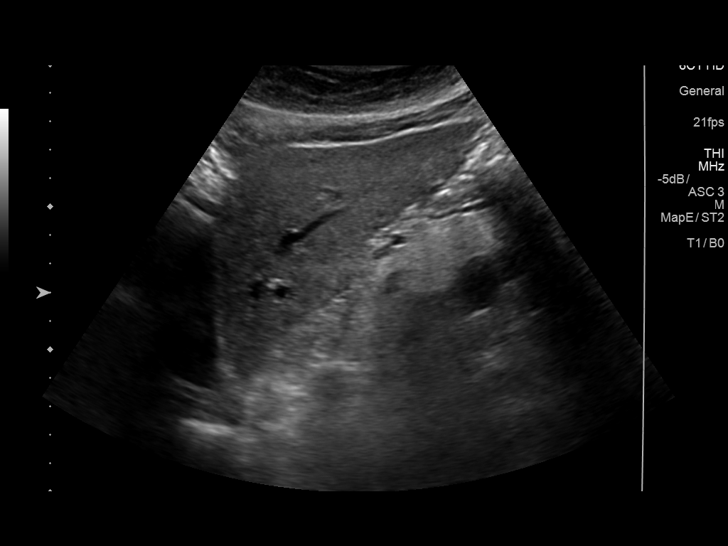
[im 17/27]
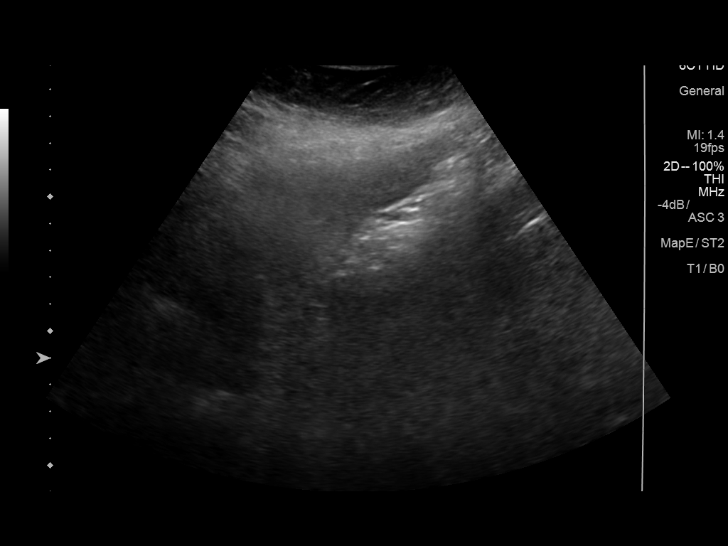
[im 18/27]
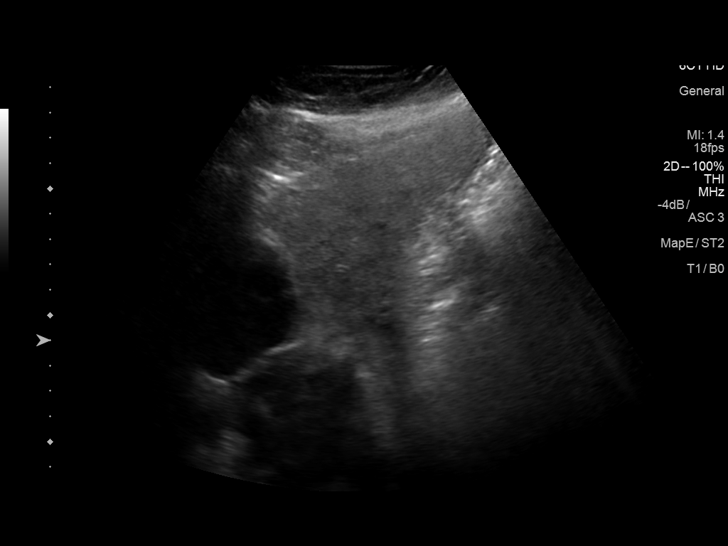
[im 20/27]
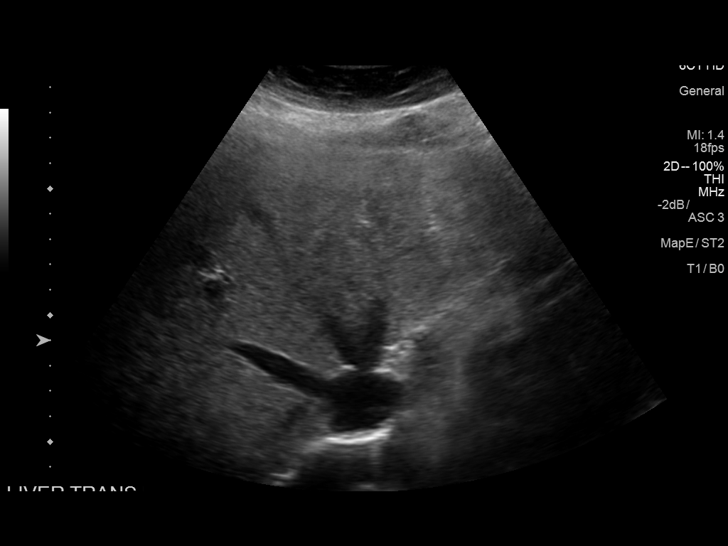
[im 22/27]
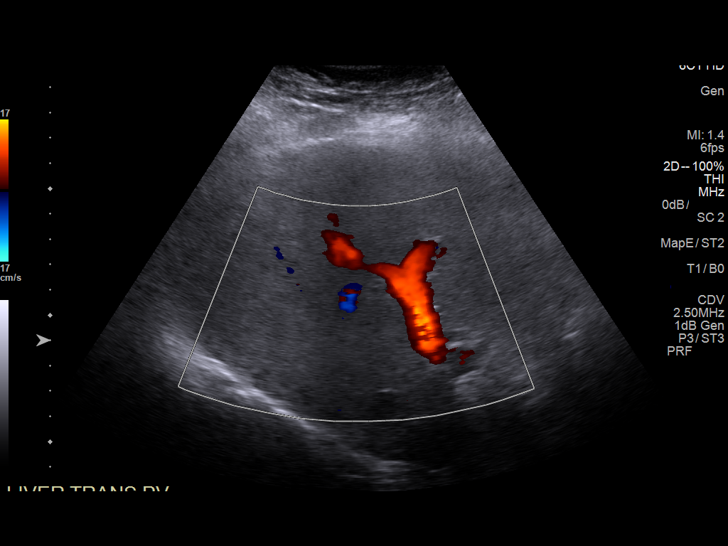
[im 24/27]
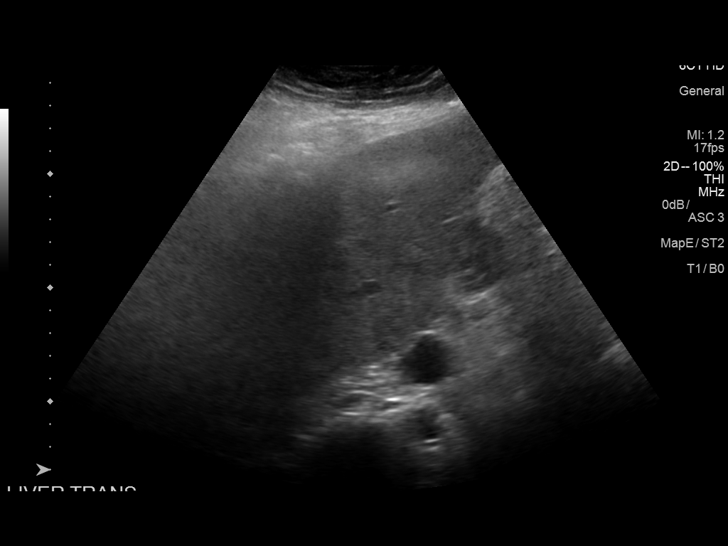
[im 27/27]
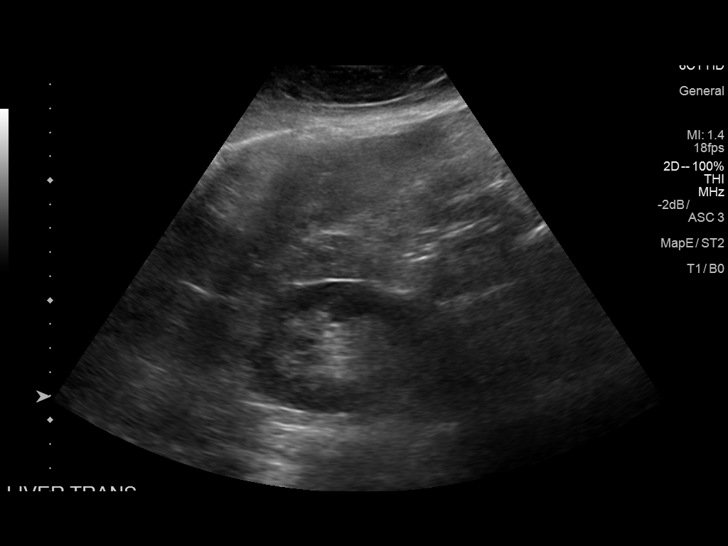

[14 of 25 positions shown; findings below may reference images not displayed]

FINDINGS: Gallbladder:

Surgically absent.

Common bile duct:

Diameter: 7 mm, likely in keeping with cholecystectomy.

Liver:

Diffusely increased in echogenicity. No focal lesion. Portal vein is
patent on color Doppler imaging with normal direction of blood flow
towards the liver.
IMPRESSION: Hepatic steatosis.

## 2017-01-26 ENCOUNTER — Other Ambulatory Visit: Payer: Self-pay | Admitting: Family Medicine

## 2017-01-26 DIAGNOSIS — Z1231 Encounter for screening mammogram for malignant neoplasm of breast: Secondary | ICD-10-CM

## 2017-02-09 ENCOUNTER — Ambulatory Visit
Admission: RE | Admit: 2017-02-09 | Discharge: 2017-02-09 | Disposition: A | Discharge status: Home / Self Care | Payer: Medicaid Other | Source: Ambulatory Visit | Attending: Family Medicine | Admitting: Family Medicine

## 2017-02-09 DIAGNOSIS — Z1231 Encounter for screening mammogram for malignant neoplasm of breast: Secondary | ICD-10-CM

## 2017-02-12 ENCOUNTER — Other Ambulatory Visit: Payer: Self-pay | Admitting: Family Medicine

## 2017-02-12 DIAGNOSIS — R928 Other abnormal and inconclusive findings on diagnostic imaging of breast: Secondary | ICD-10-CM

## 2017-02-15 ENCOUNTER — Ambulatory Visit
Admission: RE | Admit: 2017-02-15 | Discharge: 2017-02-15 | Disposition: A | Discharge status: Home / Self Care | Payer: Medicaid Other | Source: Ambulatory Visit | Attending: Family Medicine | Admitting: Family Medicine

## 2017-02-15 DIAGNOSIS — R928 Other abnormal and inconclusive findings on diagnostic imaging of breast: Secondary | ICD-10-CM

## 2017-06-28 ENCOUNTER — Other Ambulatory Visit: Payer: Self-pay

## 2017-06-28 ENCOUNTER — Other Ambulatory Visit: Payer: Self-pay | Admitting: Gastroenterology

## 2017-06-28 DIAGNOSIS — R1013 Epigastric pain: Secondary | ICD-10-CM

## 2017-07-05 ENCOUNTER — Ambulatory Visit
Admission: RE | Admit: 2017-07-05 | Discharge: 2017-07-05 | Disposition: A | Discharge status: Home / Self Care | Payer: Medicaid Other | Source: Ambulatory Visit | Attending: Gastroenterology | Admitting: Gastroenterology

## 2017-07-05 DIAGNOSIS — R1013 Epigastric pain: Secondary | ICD-10-CM

## 2017-07-06 ENCOUNTER — Other Ambulatory Visit: Payer: Self-pay

## 2017-07-10 ENCOUNTER — Encounter: Payer: Self-pay | Admitting: Sports Medicine

## 2017-07-10 ENCOUNTER — Ambulatory Visit: Payer: Medicaid Other | Admitting: Sports Medicine

## 2017-07-10 VITALS — BP 120/72 | HR 92

## 2017-07-10 DIAGNOSIS — E119 Type 2 diabetes mellitus without complications: Secondary | ICD-10-CM

## 2017-07-10 DIAGNOSIS — L600 Ingrowing nail: Secondary | ICD-10-CM | POA: Diagnosis not present

## 2017-07-10 DIAGNOSIS — M79675 Pain in left toe(s): Secondary | ICD-10-CM

## 2017-07-10 NOTE — Progress Notes (Signed)
Subjective: Rhonda Higgins is a 47 y.o. diabetic female patient presents to office today complaining of a painful incurvated, red, hot, swollen lateral left hallux nail. This has been present for since the summer. Patient has treated this by soaking. Patient denies fever/chills/nausea/vomitting/any other related constitutional symptoms at this time.  FBS 200s  A1c 7.9  Review of Systems  All other systems reviewed and are negative.   Patient Active Problem List   Diagnosis Date Noted  . Multinodular goiter (nontoxic) 11/17/2015  . Chest pain 07/20/2014  . Diabetes mellitus without complication ([place]) 84/13/2440  . GERD (gastroesophageal reflux disease) 07/19/2014  . Personality disorder ([place])   . Uterine cancer ([place]) 06/22/2011  . DM 02/09/2010  . HLD (hyperlipidemia) 02/09/2010  . Obstructive sleep apnea 02/09/2010  . Allergic rhinitis 02/09/2010  . History of uterine cancer 02/09/2010    Current Outpatient Medications on File Prior to Visit  Medication Sig Dispense Refill  . aspirin EC 81 MG tablet Take 162 mg by mouth at bedtime.    [name] buPROPion (WELLBUTRIN XL) 150 MG 24 hr tablet Take 150 mg by mouth at bedtime.     . gabapentin (NEURONTIN) 400 MG capsule Take 400-1,200 mg by mouth 3 (three) times daily. Pt takes one capsule in the morning, one in the afternoon, and three at bedtime.    [name] glimepiride (AMARYL) 4 MG tablet Take 4 mg by mouth 2 (two) times daily.   5  . lamoTRIgine (LAMICTAL) 200 MG tablet Take 200 mg by mouth at bedtime.   2  . LANTUS SOLOSTAR 100 UNIT/ML Solostar Pen Inject 20 Units into the skin at bedtime.  0  . levothyroxine (SYNTHROID, LEVOTHROID) 200 MCG tablet Take 200 mcg by mouth daily before breakfast.    . lisinopril (PRINIVIL,ZESTRIL) 5 MG tablet Take 5 mg by mouth at bedtime.     . pantoprazole (PROTONIX) 40 MG tablet Take 40 mg by mouth every morning.   0  . risperiDONE (RISPERDAL) 2 MG tablet Take 2 mg by mouth at bedtime.  2  . simvastatin  (ZOCOR) 40 MG tablet Take 40 mg by mouth at bedtime.     . sitaGLIPtin-metformin (JANUMET) 50-1000 MG tablet Take 1 tablet by mouth 2 (two) times daily with a meal.    . SYNTHROID 100 MCG tablet Take 1 tablet (100 mcg total) by mouth daily. 30 tablet 3  . calcium carbonate (OS-CAL - DOSED IN MG OF ELEMENTAL CALCIUM) 1250 (500 Ca) MG tablet Take 2 tablets (1,000 mg of elemental calcium total) by mouth 4 (four) times daily. (Patient not taking: Reported on 03/08/2016) 90 tablet 1  . cetirizine (ZYRTEC) 10 MG tablet Take 10 mg by mouth at bedtime.     [name] HYDROmorphone (DILAUDID) 2 MG tablet Take 1 tablet (2 mg total) by mouth every 4 (four) hours as needed for severe pain. (Patient not taking: Reported on 09/18/2016) 12 tablet 0  . ibuprofen (ADVIL,MOTRIN) 600 MG tablet Take 1 tablet (600 mg total) by mouth every 6 (six) hours as needed. (Patient not taking: Reported on 11/18/2016) 30 tablet 0  . ibuprofen (ADVIL,MOTRIN) 800 MG tablet Take 800 mg by mouth every 8 (eight) hours as needed for moderate pain.    . Meth-Hyo-M Bl-Na Phos-Ph Sal (URIBEL) 118 MG CAPS Take 1 capsule (118 mg total) by mouth 3 (three) times daily as needed. (Patient not taking: Reported on 09/18/2016) 15 capsule 1  . ondansetron (ZOFRAN ODT) 8 MG disintegrating tablet Take 1 tablet (8 mg  total) by mouth every 8 (eight) hours as needed for nausea or vomiting. (Patient not taking: Reported on 09/18/2016) 20 tablet 0  . oxyCODONE-acetaminophen (PERCOCET) 5-325 MG tablet Take 1 tablet by mouth every 4 (four) hours as needed for severe pain. (Patient not taking: Reported on 11/18/2016) 20 tablet 0  . predniSONE (DELTASONE) 20 MG tablet Take 2 tablets (40 mg total) by mouth daily with breakfast. For the next four days (Patient not taking: Reported on 07/10/2017) 8 tablet 0   No current facility-administered medications on file prior to visit.     Allergies  Allergen Reactions  . Hydrocodone Shortness Of Breath  . Codeine Itching    Objective:   There were no vitals filed for this visit.  General: Well developed, nourished, in no acute distress, alert and oriented x3   Dermatology: Skin is warm, dry and supple bilateral.Left hallux nail appears to be  severely incurvated with hyperkeratosis formation at the distal aspects of  the lateral nail border. (-) Erythema. (+) Edema. (-) serosanguous  drainage present. The remaining nails appear unremarkable at this time. There are no open sores, lesions or other signs of infection  present.  Vascular: Dorsalis Pedis artery and Posterior Tibial artery pedal pulses are 2/4 bilateral with immedate capillary fill time. Pedal hair growth present. No lower extremity edema.   Neruologic: Grossly intact via light touch bilateral.  Musculoskeletal: Tenderness to palpation of the Left hallux lateral nail fold. Muscular strength within normal limits in all groups bilateral.   Assesement and Plan: Problem List Items Addressed This Visit      Endocrine   Diabetes mellitus without complication ([place])    Other Visit Diagnoses    Ingrown toenail of left foot    -  Primary   Toe pain, left           -Discussed treatment alternatives and plan of care; Explained permanent/temporary nail avulsion and post procedure course to patient. - After a verbal consent, injected 3 ml of a 50:50 mixture of 2% plain  lidocaine and 0.5% plain marcaine in a normal hallux block fashion. Next, a  betadine prep was performed. Anesthesia was tested and found to be appropriate.  The offending left hallux lateral nail border was then incised from the hyponychium to the epinychium. The offending nail border was removed and cleared from the field. The area was curretted for any remaining nail or spicules. Phenol application performed and the area was then flushed with alcohol and dressed with antibiotic cream and a dry sterile dressing. -Patient was instructed to leave the dressing intact for today and begin soaking  in a  weak solution of betadine or Epsom salt and water tomorrow. Patient was instructed to  soak for 15 minutes each day and apply neosporin and a gauze or bandaid dressing each day. -Patient was instructed to monitor the toe for signs of infection and return to office if toe becomes red, hot or swollen. -Advised ice, elevation, and tylenol or motrin if needed for pain.  -Patient is to return in 2 weeks for follow up care/nail check or sooner if problems arise.  Landis Martins, DPM

## 2017-07-10 NOTE — Progress Notes (Signed)
   Subjective:    Patient ID: Rhonda Higgins, female    DOB: [birth year], 47 y.o.   MRN: [id number]  HPI    Review of Systems  All other systems reviewed and are negative.      Objective:   Physical Exam        Assessment & Plan:

## 2017-07-10 NOTE — Patient Instructions (Signed)

## 2017-07-24 ENCOUNTER — Ambulatory Visit: Payer: Medicaid Other | Admitting: Sports Medicine

## 2017-07-24 ENCOUNTER — Encounter: Payer: Self-pay | Admitting: Sports Medicine

## 2017-07-24 DIAGNOSIS — M79675 Pain in left toe(s): Secondary | ICD-10-CM

## 2017-07-24 DIAGNOSIS — E119 Type 2 diabetes mellitus without complications: Secondary | ICD-10-CM

## 2017-07-24 DIAGNOSIS — Z9889 Other specified postprocedural states: Secondary | ICD-10-CM

## 2017-07-24 NOTE — Progress Notes (Signed)
Subjective: Rhonda Higgins is a 47 y.o. female patient returns to office today for follow up evaluation after having Left Hallux lateral permanent nail avulsion performed on 07-10-17. Patient has been soaking using epsom salt and applying topical antibiotic covered with bandaid daily. Admits to a little pain on the side opposite for the procedure side on the left 1st toenail. Patient denies fever/chills/nausea/vomitting/any other related constitutional symptoms at this time.  Patient Active Problem List   Diagnosis Date Noted  . Multinodular goiter (nontoxic) 11/17/2015  . Chest pain 07/20/2014  . Diabetes mellitus without complication ([place]) 06/27/3233  . GERD (gastroesophageal reflux disease) 07/19/2014  . Personality disorder ([place])   . Uterine cancer ([place]) 06/22/2011  . DM 02/09/2010  . HLD (hyperlipidemia) 02/09/2010  . Obstructive sleep apnea 02/09/2010  . Allergic rhinitis 02/09/2010  . History of uterine cancer 02/09/2010    Current Outpatient Medications on File Prior to Visit  Medication Sig Dispense Refill  . aspirin EC 81 MG tablet Take 162 mg by mouth at bedtime.    [name] buPROPion (WELLBUTRIN XL) 150 MG 24 hr tablet Take 150 mg by mouth at bedtime.     . calcium carbonate (OS-CAL - DOSED IN MG OF ELEMENTAL CALCIUM) 1250 (500 Ca) MG tablet Take 2 tablets (1,000 mg of elemental calcium total) by mouth 4 (four) times daily. (Patient not taking: Reported on 03/08/2016) 90 tablet 1  . cetirizine (ZYRTEC) 10 MG tablet Take 10 mg by mouth at bedtime.     . gabapentin (NEURONTIN) 400 MG capsule Take 400-1,200 mg by mouth 3 (three) times daily. Pt takes one capsule in the morning, one in the afternoon, and three at bedtime.    [name] glimepiride (AMARYL) 4 MG tablet Take 4 mg by mouth 2 (two) times daily.   5  . HYDROmorphone (DILAUDID) 2 MG tablet Take 1 tablet (2 mg total) by mouth every 4 (four) hours as needed for severe pain. (Patient not taking: Reported on 09/18/2016) 12 tablet 0  .  ibuprofen (ADVIL,MOTRIN) 600 MG tablet Take 1 tablet (600 mg total) by mouth every 6 (six) hours as needed. (Patient not taking: Reported on 11/18/2016) 30 tablet 0  . ibuprofen (ADVIL,MOTRIN) 800 MG tablet Take 800 mg by mouth every 8 (eight) hours as needed for moderate pain.    [name] lamoTRIgine (LAMICTAL) 200 MG tablet Take 200 mg by mouth at bedtime.   2  . LANTUS SOLOSTAR 100 UNIT/ML Solostar Pen Inject 20 Units into the skin at bedtime.  0  . levothyroxine (SYNTHROID, LEVOTHROID) 200 MCG tablet Take 200 mcg by mouth daily before breakfast.    . lisinopril (PRINIVIL,ZESTRIL) 5 MG tablet Take 5 mg by mouth at bedtime.     . Meth-Hyo-M Bl-Na Phos-Ph Sal (URIBEL) 118 MG CAPS Take 1 capsule (118 mg total) by mouth 3 (three) times daily as needed. (Patient not taking: Reported on 09/18/2016) 15 capsule 1  . ondansetron (ZOFRAN ODT) 8 MG disintegrating tablet Take 1 tablet (8 mg total) by mouth every 8 (eight) hours as needed for nausea or vomiting. (Patient not taking: Reported on 09/18/2016) 20 tablet 0  . oxyCODONE-acetaminophen (PERCOCET) 5-325 MG tablet Take 1 tablet by mouth every 4 (four) hours as needed for severe pain. (Patient not taking: Reported on 11/18/2016) 20 tablet 0  . pantoprazole (PROTONIX) 40 MG tablet Take 40 mg by mouth every morning.   0  . predniSONE (DELTASONE) 20 MG tablet Take 2 tablets (40 mg total) by mouth daily with breakfast.  For the next four days (Patient not taking: Reported on 07/10/2017) 8 tablet 0  . risperiDONE (RISPERDAL) 2 MG tablet Take 2 mg by mouth at bedtime.  2  . simvastatin (ZOCOR) 40 MG tablet Take 40 mg by mouth at bedtime.     . sitaGLIPtin-metformin (JANUMET) 50-1000 MG tablet Take 1 tablet by mouth 2 (two) times daily with a meal.    . SYNTHROID 100 MCG tablet Take 1 tablet (100 mcg total) by mouth daily. 30 tablet 3   No current facility-administered medications on file prior to visit.     Allergies  Allergen Reactions  . Hydrocodone Shortness Of Breath   . Codeine Itching    Objective:  General: Well developed, nourished, in no acute distress, alert and oriented x3   Dermatology: Skin is warm, dry and supple bilateral. Left hallux lateral nail bed appears to be clean, dry, with mild granular tissue and surrounding eschar/scab. (-) Erythema. (-) Edema. (-) serosanguous drainage present. The remaining nails appear unremarkable at this time. There are no other lesions or other signs of infection present.  Neurovascular status: Intact. No lower extremity swelling; No pain with calf compression bilateral.  Musculoskeletal: Decreased tenderness to palpation of the Left hallux lateral nail fold. Mild tenderness to the medial margin secondary to thickness with no infection or acute ingrowing. Muscular strength within normal limits bilateral.   Assesement and Plan: Problem List Items Addressed This Visit      Endocrine   Diabetes mellitus without complication ([place])    Other Visit Diagnoses    S/P nail surgery    -  Primary   Toe pain, left          -Examined patient  -Cleansed left hallux lateral nail fold and gently scrubbed with peroxide and q-tip/curetted away eschar at site and applied antibiotic cream covered with bandaid.  -Discussed plan of care with patient. -Patient to continue soaking in a weak solution of Epsom salt and warm water. Patient was instructed to soak for 15-20 minutes each day until the toe appears normal and there is no drainage, redness, tenderness, or swelling at the procedure site, and apply neosporin and a gauze or bandaid dressing each day as needed. May leave open to air at night. -Educated patient on long term care after nail surgery. -Patient was instructed to monitor the toe for reoccurrence and signs of infection; Patient advised to return to office or go to ER if toe becomes red, hot or swollen. -Patient is to return as needed or sooner if problems arise or in 3 months for diabetic foot care.   Landis Martins, DPM

## 2017-07-31 ENCOUNTER — Institutional Professional Consult (permissible substitution): Payer: Medicaid Other | Admitting: Neurology

## 2017-08-01 ENCOUNTER — Ambulatory Visit: Payer: Self-pay | Admitting: Surgery

## 2017-08-29 ENCOUNTER — Encounter (HOSPITAL_COMMUNITY): Payer: Self-pay

## 2017-08-29 NOTE — Patient Instructions (Addendum)
Your procedure is scheduled on: Thursday, September 06, 2017   Surgery Time:  7:30AM-9:00AM   Report to [facility] Main  Entrance   Arrive by 5:30 AM pick up the phone at the front desk dial [phone], have a seat in the lobby and a staff member will come and escort you to Short Stay Department   Call this number if you have problems the morning of surgery [phone]   Do not eat food or drink liquids :After Midnight.   Do NOT smoke after Midnight   Take these medicines the morning of surgery with A SIP OF WATER: Gabapentin, Synthroid, Protonix   Take half (1/2) Lantus insulin dose at bedtime the night before surgery   DO NOT TAKE ANY DIABETIC MEDICATIONS DAY OF YOUR SURGERY                               You may not have any metal on your body including hair pins, jewelry, and body piercings             Do not wear make-up, lotions, powders, perfumes/cologne, or deodorant             Do not wear nail polish.  Do not shave  48 hours prior to surgery.    Do not bring valuables to the hospital. [place].   Contacts, dentures or bridgework may not be worn into surgery.   Leave suitcase in the car. After surgery it may be brought to your room.   Special Instructions: Bring a copy of your healthcare power of attorney and living will documents         the day of surgery if you haven't scanned them in before.              Please read over the following fact sheets you were given:  [facility] - Preparing for Surgery Before surgery, you can play an important role.  Because skin is not sterile, your skin needs to be as free of germs as possible.  You can reduce the number of germs on your skin by washing with CHG (chlorahexidine gluconate) soap before surgery.  CHG is an antiseptic cleaner which kills germs and bonds with the skin to continue killing germs even after washing. Please DO NOT use if you have an allergy to CHG or  antibacterial soaps.  If your skin becomes reddened/irritated stop using the CHG and inform your nurse when you arrive at Short Stay. Do not shave (including legs and underarms) for at least 48 hours prior to the first CHG shower.  You may shave your face/neck.  Please follow these instructions carefully:  1.  Shower with CHG Soap the night before surgery and the  morning of surgery.  2.  If you choose to wash your hair, wash your hair first as usual with your normal  shampoo.  3.  After you shampoo, rinse your hair and body thoroughly to remove the shampoo.                             4.  Use CHG as you would any other liquid soap.  You can apply chg directly to the skin and wash.  Gently with a scrungie or clean washcloth.  5.  Apply the CHG Soap to your body ONLY FROM THE NECK DOWN.   Do   not use on face/ open                           Wound or open sores. Avoid contact with eyes, ears mouth and   genitals (private parts).                       Wash face,  Genitals (private parts) with your normal soap.             6.  Wash thoroughly, paying special attention to the area where your    surgery  will be performed.  7.  Thoroughly rinse your body with warm water from the neck down.  8.  DO NOT shower/wash with your normal soap after using and rinsing off the CHG Soap.                9.  Pat yourself dry with a clean towel.            10.  Wear clean pajamas.            11.  Place clean sheets on your bed the night of your first shower and do not  sleep with pets. Day of Surgery : Do not apply any lotions/deodorants the morning of surgery.  Please wear clean clothes to the hospital/surgery center.  FAILURE TO FOLLOW THESE INSTRUCTIONS MAY RESULT IN THE CANCELLATION OF YOUR SURGERY  PATIENT SIGNATURE_________________________________  NURSE SIGNATURE__________________________________  ________________________________________________________________________

## 2017-08-29 NOTE — Pre-Procedure Instructions (Signed)
The following are the patient's hard chart: Last office visit Dr. Ronnald Ramp 07/14/2017 Hgb A1C 8.0 07/14/2017  The following are in epic: EKG 11/19/2016

## 2017-08-30 ENCOUNTER — Encounter (HOSPITAL_COMMUNITY): Payer: Self-pay

## 2017-08-30 ENCOUNTER — Ambulatory Visit (HOSPITAL_COMMUNITY)
Admission: RE | Admit: 2017-08-30 | Discharge: 2017-08-30 | Disposition: A | Discharge status: Home / Self Care | Payer: Medicaid Other | Source: Ambulatory Visit | Attending: Anesthesiology | Admitting: Anesthesiology

## 2017-08-30 ENCOUNTER — Other Ambulatory Visit: Payer: Self-pay

## 2017-08-30 ENCOUNTER — Encounter (HOSPITAL_COMMUNITY)
Admission: RE | Admit: 2017-08-30 | Discharge: 2017-08-30 | Disposition: A | Discharge status: Home / Self Care | Payer: Medicaid Other | Source: Ambulatory Visit | Attending: Surgery | Admitting: Surgery

## 2017-08-30 DIAGNOSIS — K811 Chronic cholecystitis: Secondary | ICD-10-CM | POA: Insufficient documentation

## 2017-08-30 DIAGNOSIS — Z01818 Encounter for other preprocedural examination: Secondary | ICD-10-CM

## 2017-08-30 DIAGNOSIS — Z01812 Encounter for preprocedural laboratory examination: Secondary | ICD-10-CM | POA: Insufficient documentation

## 2017-08-30 HISTORY — DX: Type 2 diabetes mellitus without complications: E11.9

## 2017-08-30 HISTORY — DX: Long term (current) use of insulin: Z79.4

## 2017-08-30 HISTORY — DX: Reserved for inherently not codable concepts without codable children: IMO0001

## 2017-08-30 HISTORY — DX: Other chest pain: R07.89

## 2017-08-30 HISTORY — DX: Abnormal levels of other serum enzymes: R74.8

## 2017-08-30 HISTORY — DX: Nausea with vomiting, unspecified: R11.2

## 2017-08-30 HISTORY — DX: Nausea with vomiting, unspecified: Z98.890

## 2017-08-30 HISTORY — DX: Obesity, unspecified: E66.9

## 2017-08-30 HISTORY — DX: Other specified postprocedural states: R11.2

## 2017-08-30 HISTORY — DX: Other specified postprocedural states: Z98.890

## 2017-08-30 LAB — COMPREHENSIVE METABOLIC PANEL
ALT: 65 U/L — ABNORMAL HIGH (ref 14–54)
AST: 78 U/L — ABNORMAL HIGH (ref 15–41)
Albumin: 3.7 g/dL (ref 3.5–5.0)
Alkaline Phosphatase: 89 U/L (ref 38–126)
Anion gap: 11 (ref 5–15)
BUN: 13 mg/dL (ref 6–20)
CO2: 25 mmol/L (ref 22–32)
Calcium: 8.9 mg/dL (ref 8.9–10.3)
Chloride: 105 mmol/L (ref 101–111)
Creatinine, Ser: 0.75 mg/dL (ref 0.44–1.00)
GFR calc Af Amer: >60 mL/min (ref >60)
GFR calc non Af Amer: >60 mL/min (ref >60)
Glucose, Bld: 151 mg/dL — ABNORMAL HIGH (ref 65–99)
Potassium: 4.1 mmol/L (ref 3.5–5.1)
Sodium: 141 mmol/L (ref 135–145)
Total Bilirubin: 1 mg/dL (ref 0.3–1.2)
Total Protein: 6.7 g/dL (ref 6.5–8.1)

## 2017-08-30 LAB — CBC
HCT: 41.5 % (ref 36.0–46.0)
Hemoglobin: 13.3 g/dL (ref 12.0–15.0)
MCH: 27.5 pg (ref 26.0–34.0)
MCHC: 32 g/dL (ref 30.0–36.0)
MCV: 85.7 fL (ref 78.0–100.0)
Platelets: 105 10*3/uL — ABNORMAL LOW (ref 150–400)
RBC: 4.84 MIL/uL (ref 3.87–5.11)
RDW: 14.3 % (ref 11.5–15.5)
WBC: 5 10*3/uL (ref 4.0–10.5)

## 2017-08-30 LAB — GLUCOSE, CAPILLARY: Glucose-Capillary: 163 mg/dL — ABNORMAL HIGH (ref 65–99)

## 2017-08-30 IMAGING — CR DG CHEST 2V
2 series · 2 of 2 positions shown · non-contrast
Comparison: [DATE]

CLINICAL DATA: Preoperative evaluation for gallbladder surgery

EXAM:
CHEST - 2 VIEW

[w chest pa]
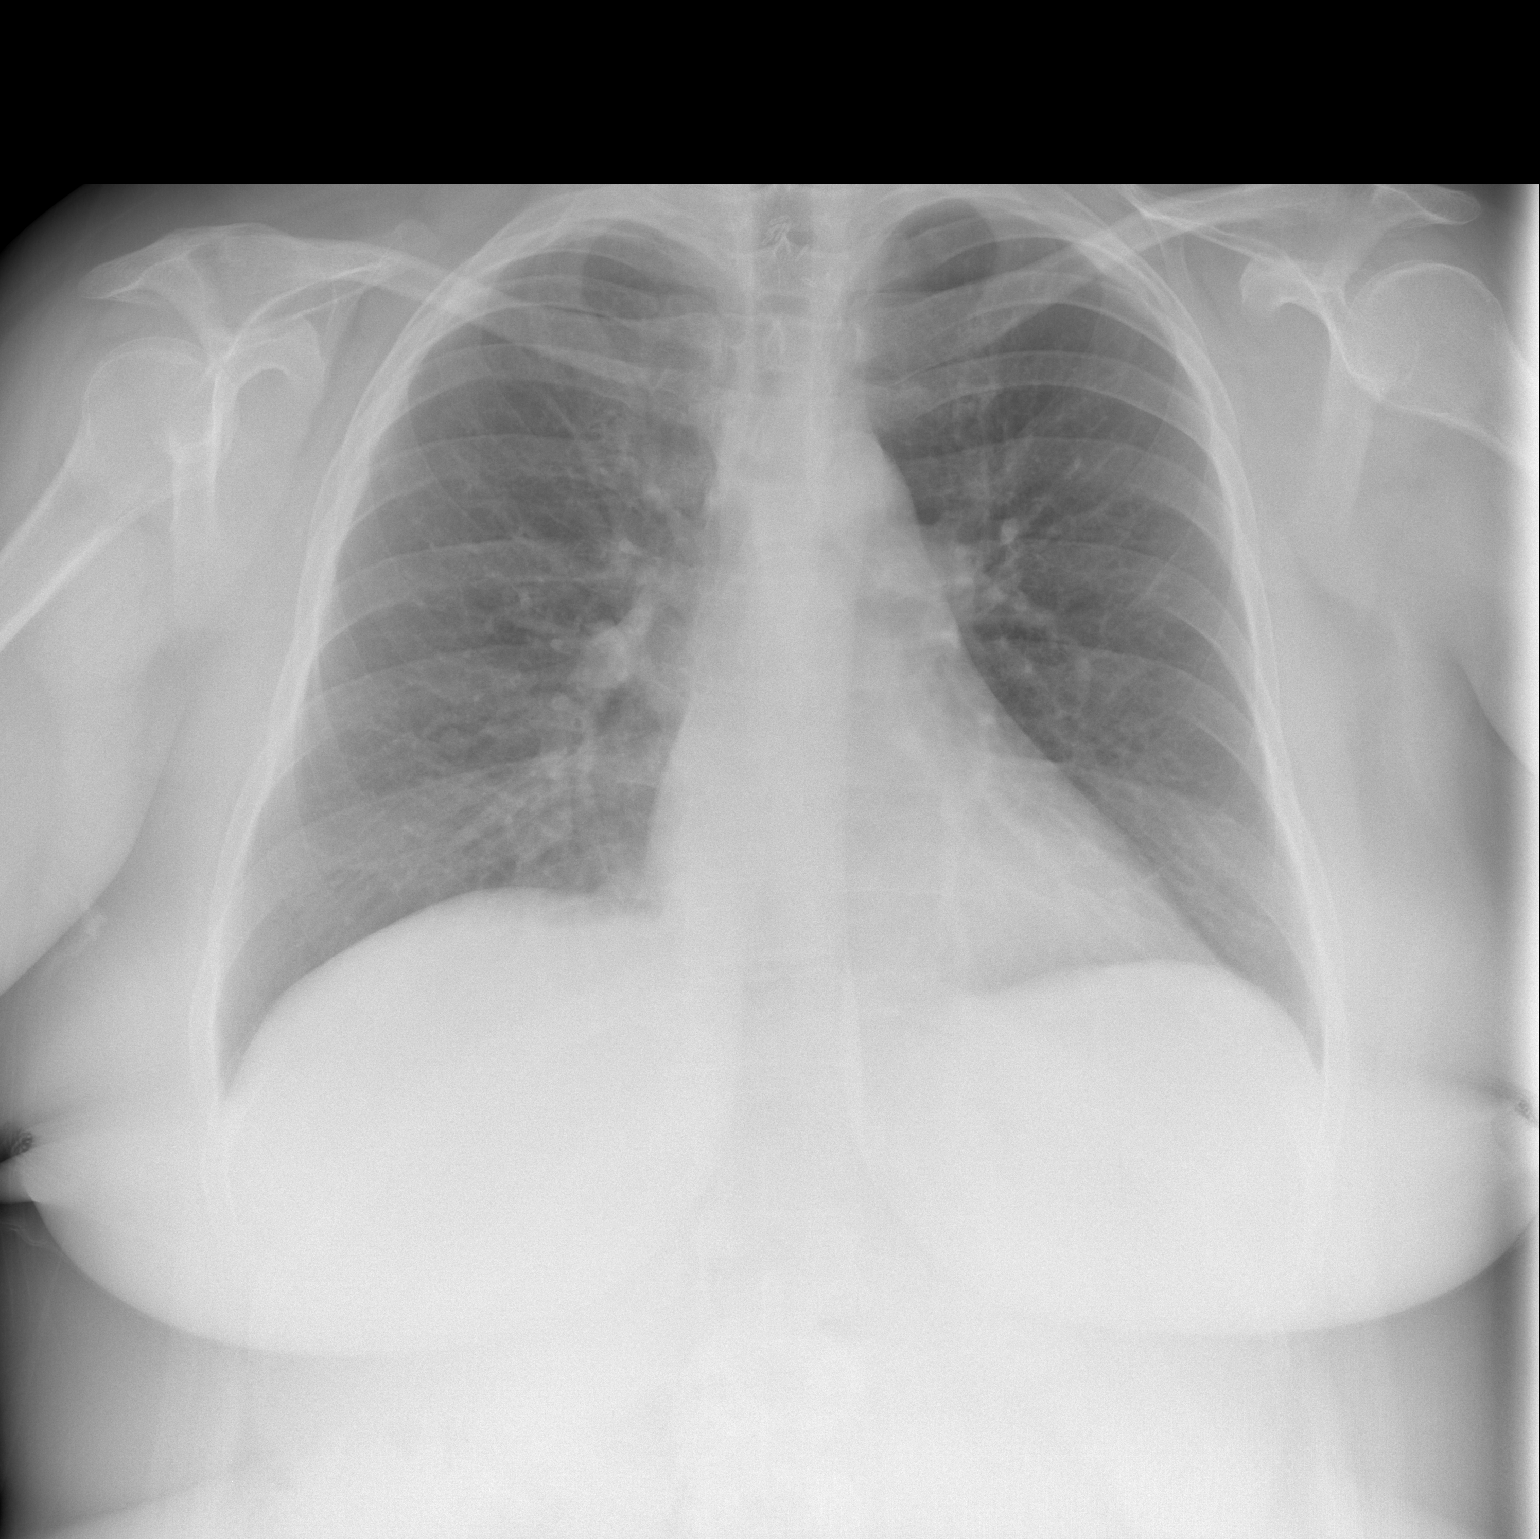

[w chest lat]
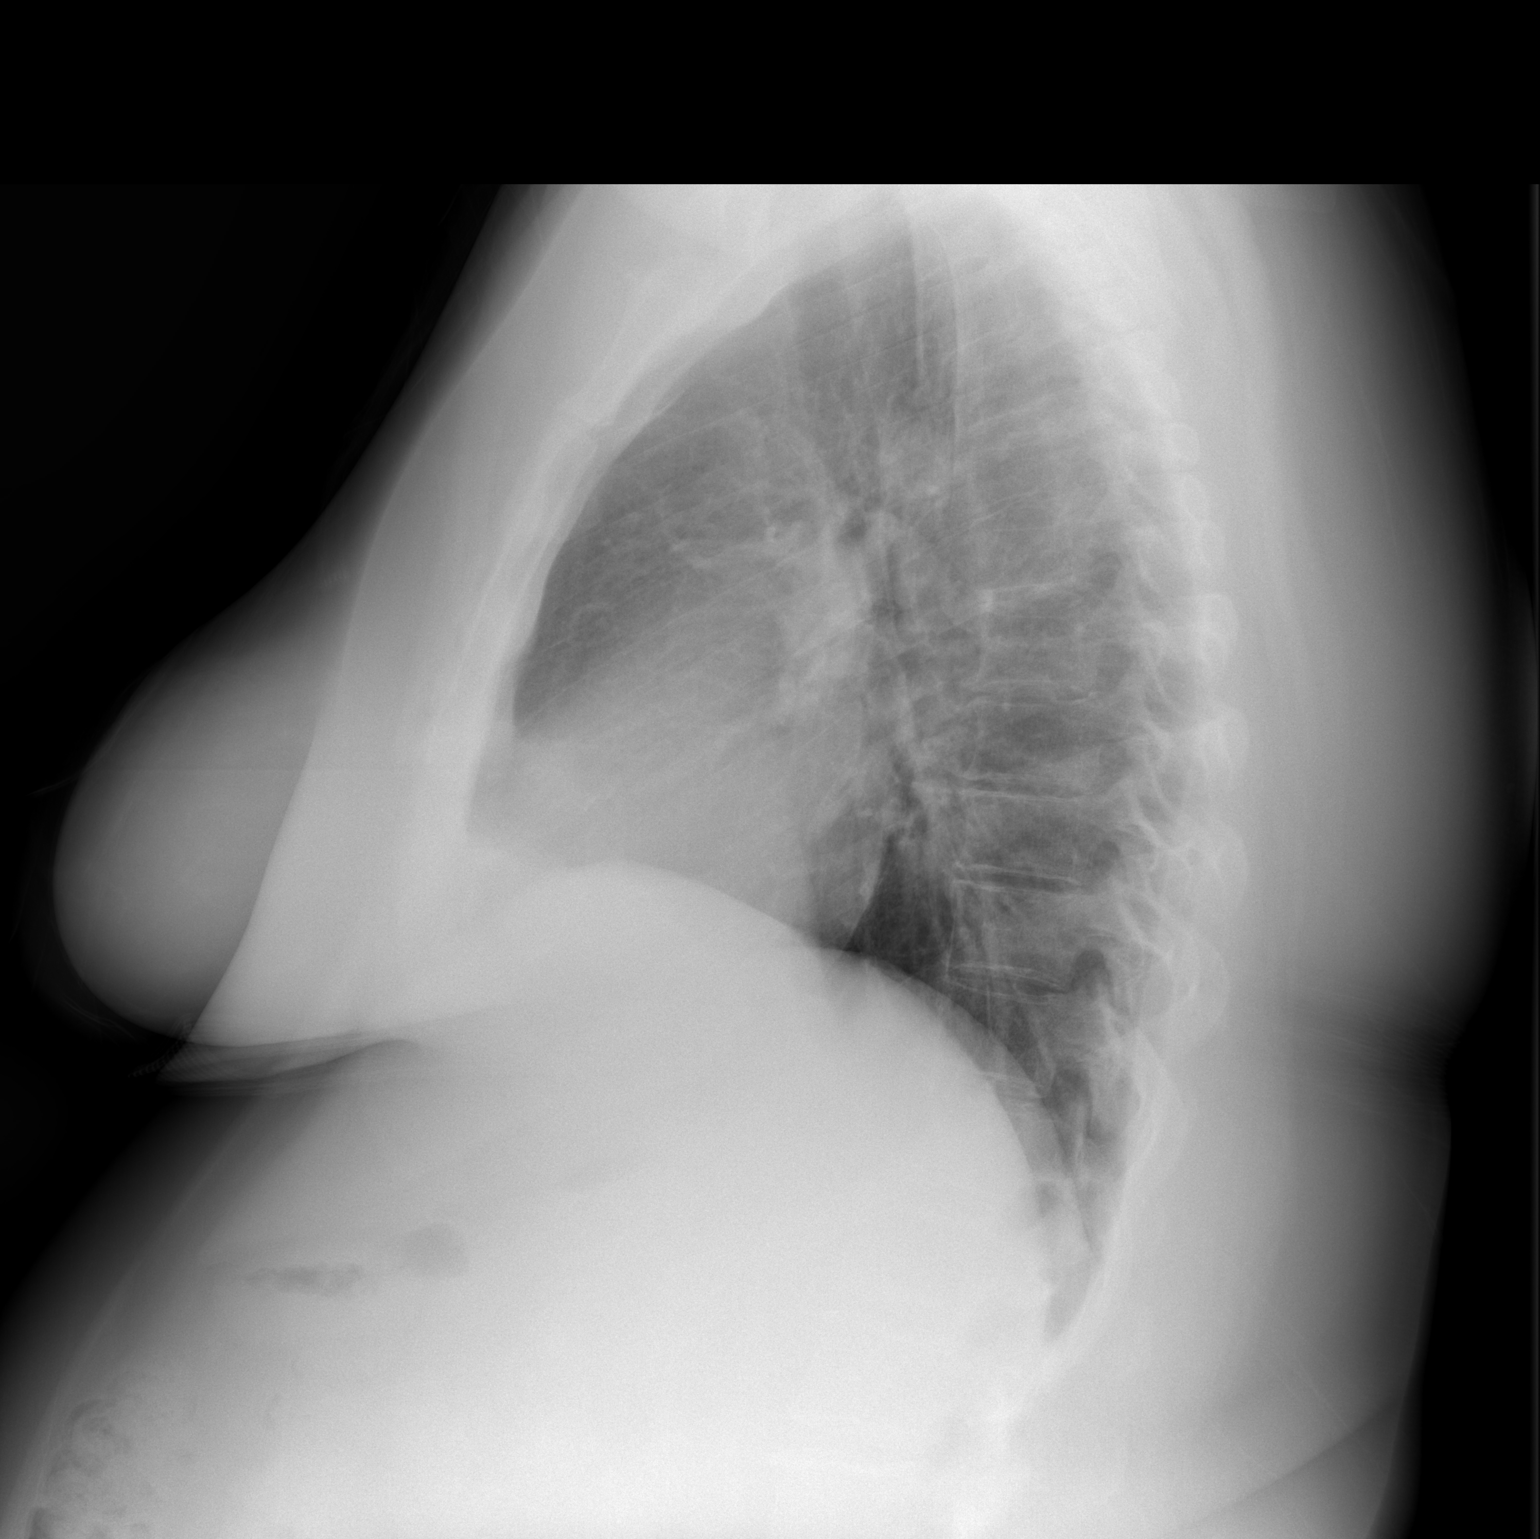

[2 of 2 positions shown; findings below may reference images not displayed]

FINDINGS: The heart size and mediastinal contours are within normal limits.
Both lungs are clear. The visualized skeletal structures are
unremarkable.
IMPRESSION: No active cardiopulmonary disease.

## 2017-08-30 NOTE — Pre-Procedure Instructions (Signed)
CBC and CMP results 08/30/2017 faxed to Dr. Harlow Asa via epic.

## 2017-08-30 NOTE — Pre-Procedure Instructions (Signed)
Dr. Fransisco Beau made aware of platelet level from CBC 08/30/2017 no new orders received at this time.

## 2017-09-05 MED ORDER — DEXTROSE 5 % IV SOLN
3.0000 g | INTRAVENOUS | Status: AC
  Administered 2017-09-06: 3 g via INTRAVENOUS
  Filled 2017-09-05: qty 3

## 2017-09-06 ENCOUNTER — Ambulatory Visit (HOSPITAL_COMMUNITY): Payer: Medicaid Other

## 2017-09-06 ENCOUNTER — Encounter (HOSPITAL_COMMUNITY): Payer: Self-pay | Admitting: *Deleted

## 2017-09-06 ENCOUNTER — Observation Stay (HOSPITAL_COMMUNITY)
Admission: RE | Admit: 2017-09-06 | Discharge: 2017-09-07 | Disposition: A | Discharge status: Home / Self Care | Payer: Medicaid Other | Source: Ambulatory Visit | Attending: Surgery | Admitting: Surgery

## 2017-09-06 ENCOUNTER — Other Ambulatory Visit: Payer: Self-pay

## 2017-09-06 ENCOUNTER — Encounter (HOSPITAL_COMMUNITY)
Admission: RE | Disposition: A | Discharge status: Home / Self Care | Payer: Self-pay | Source: Ambulatory Visit | Attending: Surgery

## 2017-09-06 DIAGNOSIS — E785 Hyperlipidemia, unspecified: Secondary | ICD-10-CM | POA: Diagnosis not present

## 2017-09-06 DIAGNOSIS — Z791 Long term (current) use of non-steroidal anti-inflammatories (NSAID): Secondary | ICD-10-CM | POA: Diagnosis not present

## 2017-09-06 DIAGNOSIS — Z79899 Other long term (current) drug therapy: Secondary | ICD-10-CM | POA: Diagnosis not present

## 2017-09-06 DIAGNOSIS — E7800 Pure hypercholesterolemia, unspecified: Secondary | ICD-10-CM | POA: Insufficient documentation

## 2017-09-06 DIAGNOSIS — E890 Postprocedural hypothyroidism: Secondary | ICD-10-CM | POA: Insufficient documentation

## 2017-09-06 DIAGNOSIS — Z7982 Long term (current) use of aspirin: Secondary | ICD-10-CM | POA: Diagnosis not present

## 2017-09-06 DIAGNOSIS — K7460 Unspecified cirrhosis of liver: Secondary | ICD-10-CM | POA: Diagnosis not present

## 2017-09-06 DIAGNOSIS — G4733 Obstructive sleep apnea (adult) (pediatric): Secondary | ICD-10-CM | POA: Insufficient documentation

## 2017-09-06 DIAGNOSIS — R52 Pain, unspecified: Secondary | ICD-10-CM

## 2017-09-06 DIAGNOSIS — K8050 Calculus of bile duct without cholangitis or cholecystitis without obstruction: Secondary | ICD-10-CM

## 2017-09-06 DIAGNOSIS — K8010 Calculus of gallbladder with chronic cholecystitis without obstruction: Secondary | ICD-10-CM | POA: Diagnosis present

## 2017-09-06 DIAGNOSIS — Z8542 Personal history of malignant neoplasm of other parts of uterus: Secondary | ICD-10-CM | POA: Diagnosis not present

## 2017-09-06 DIAGNOSIS — G4730 Sleep apnea, unspecified: Secondary | ICD-10-CM | POA: Insufficient documentation

## 2017-09-06 DIAGNOSIS — K317 Polyp of stomach and duodenum: Secondary | ICD-10-CM | POA: Insufficient documentation

## 2017-09-06 DIAGNOSIS — F609 Personality disorder, unspecified: Secondary | ICD-10-CM | POA: Insufficient documentation

## 2017-09-06 DIAGNOSIS — Z923 Personal history of irradiation: Secondary | ICD-10-CM | POA: Insufficient documentation

## 2017-09-06 DIAGNOSIS — F419 Anxiety disorder, unspecified: Secondary | ICD-10-CM | POA: Insufficient documentation

## 2017-09-06 DIAGNOSIS — E119 Type 2 diabetes mellitus without complications: Secondary | ICD-10-CM | POA: Insufficient documentation

## 2017-09-06 DIAGNOSIS — Z79890 Hormone replacement therapy (postmenopausal): Secondary | ICD-10-CM | POA: Diagnosis not present

## 2017-09-06 DIAGNOSIS — F329 Major depressive disorder, single episode, unspecified: Secondary | ICD-10-CM | POA: Insufficient documentation

## 2017-09-06 DIAGNOSIS — K8064 Calculus of gallbladder and bile duct with chronic cholecystitis without obstruction: Secondary | ICD-10-CM | POA: Diagnosis not present

## 2017-09-06 DIAGNOSIS — K219 Gastro-esophageal reflux disease without esophagitis: Secondary | ICD-10-CM | POA: Diagnosis not present

## 2017-09-06 DIAGNOSIS — Z6841 Body Mass Index (BMI) 40.0 and over, adult: Secondary | ICD-10-CM | POA: Diagnosis not present

## 2017-09-06 DIAGNOSIS — J309 Allergic rhinitis, unspecified: Secondary | ICD-10-CM | POA: Diagnosis not present

## 2017-09-06 DIAGNOSIS — Z794 Long term (current) use of insulin: Secondary | ICD-10-CM | POA: Insufficient documentation

## 2017-09-06 HISTORY — PX: CHOLECYSTECTOMY: SHX55

## 2017-09-06 LAB — GLUCOSE, CAPILLARY
Glucose-Capillary: 161 mg/dL — ABNORMAL HIGH (ref 65–99)
Glucose-Capillary: 189 mg/dL — ABNORMAL HIGH (ref 65–99)
Glucose-Capillary: 303 mg/dL — ABNORMAL HIGH (ref 65–99)
Glucose-Capillary: 323 mg/dL — ABNORMAL HIGH (ref 65–99)

## 2017-09-06 IMAGING — RF DG CHOLANGIOGRAM OPERATIVE
1 series · 4 of 4 positions shown · non-contrast
Comparison: Abdomen ultrasound [DATE]

FLUOROSCOPY TIME:  0 minutes 20 seconds

CLINICAL DATA: 46-year-old female undergoing cholecystectomy.
Intraoperative cholangiogram.

EXAM:
INTRAOPERATIVE CHOLANGIOGRAM
TECHNIQUE: Cholangiographic images from the C-arm fluoroscopic device were
submitted for interpretation post-operatively. Please see the
procedural report for the amount of contrast and the fluoroscopy
time utilized.

[Series 1: run · 4 of 115 frames shown]
[frame 18/115]
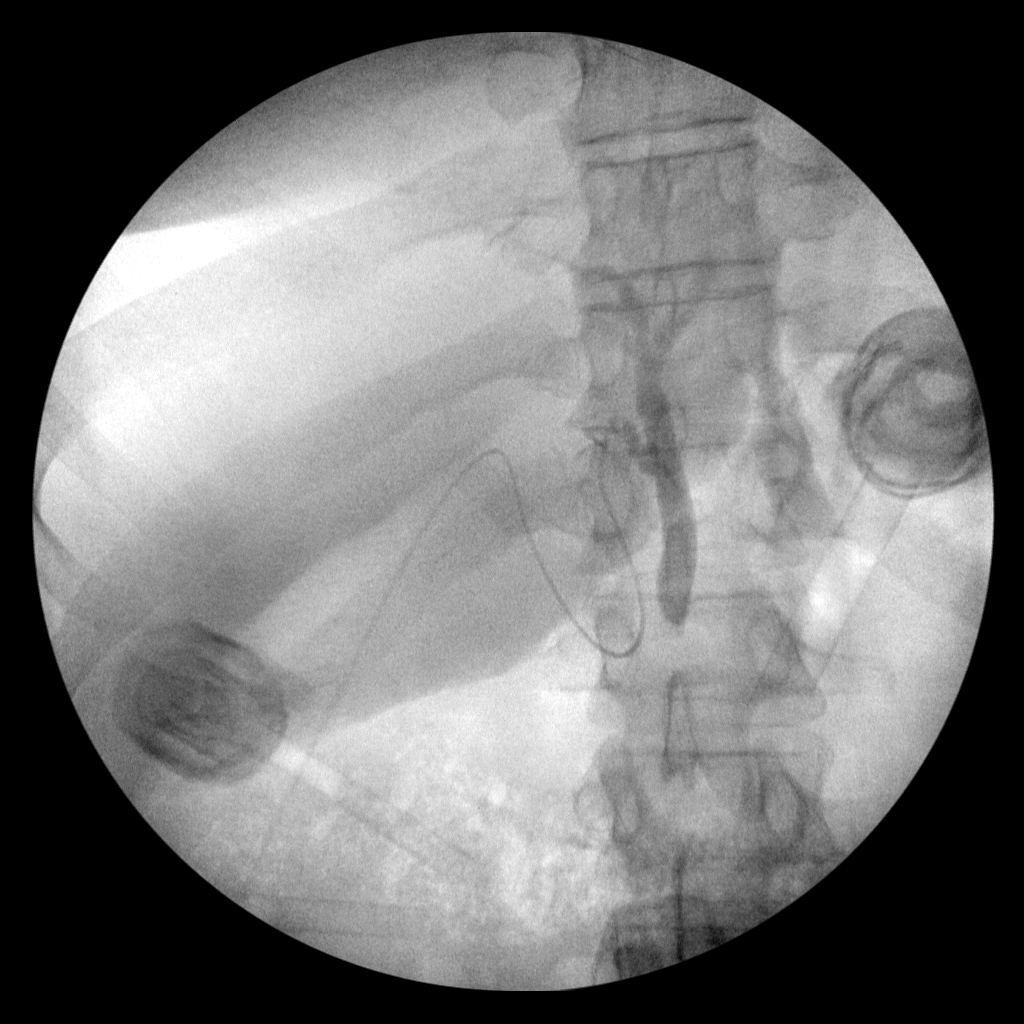
[frame 58/115]
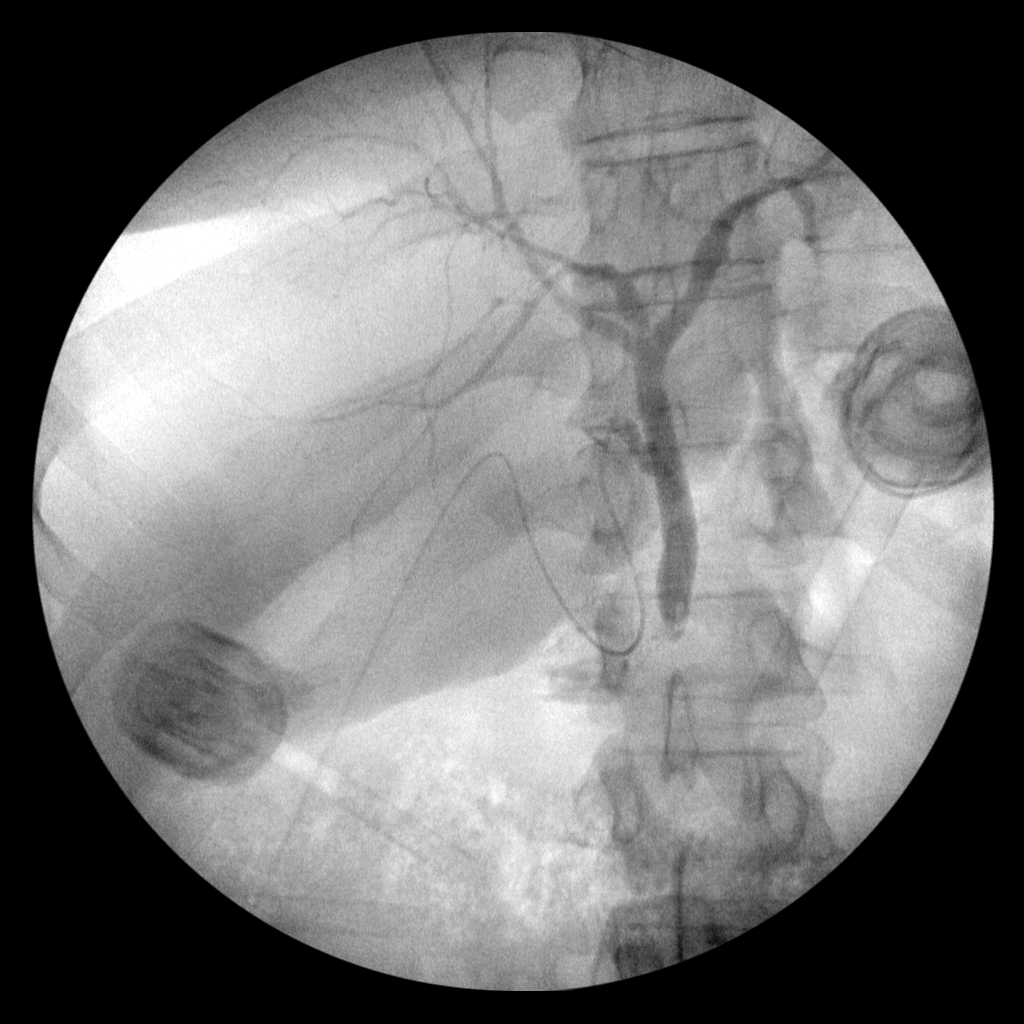
[frame 98/115]
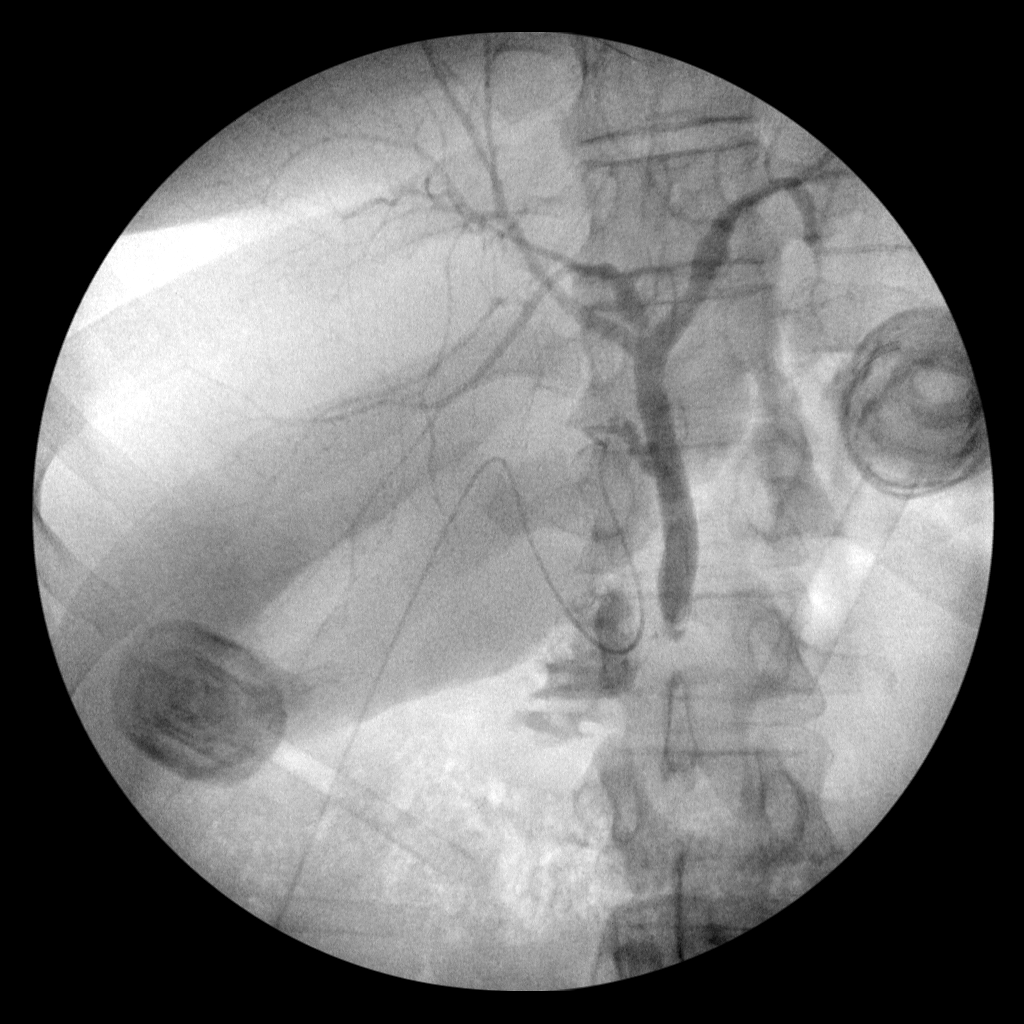
[frame 115/115]
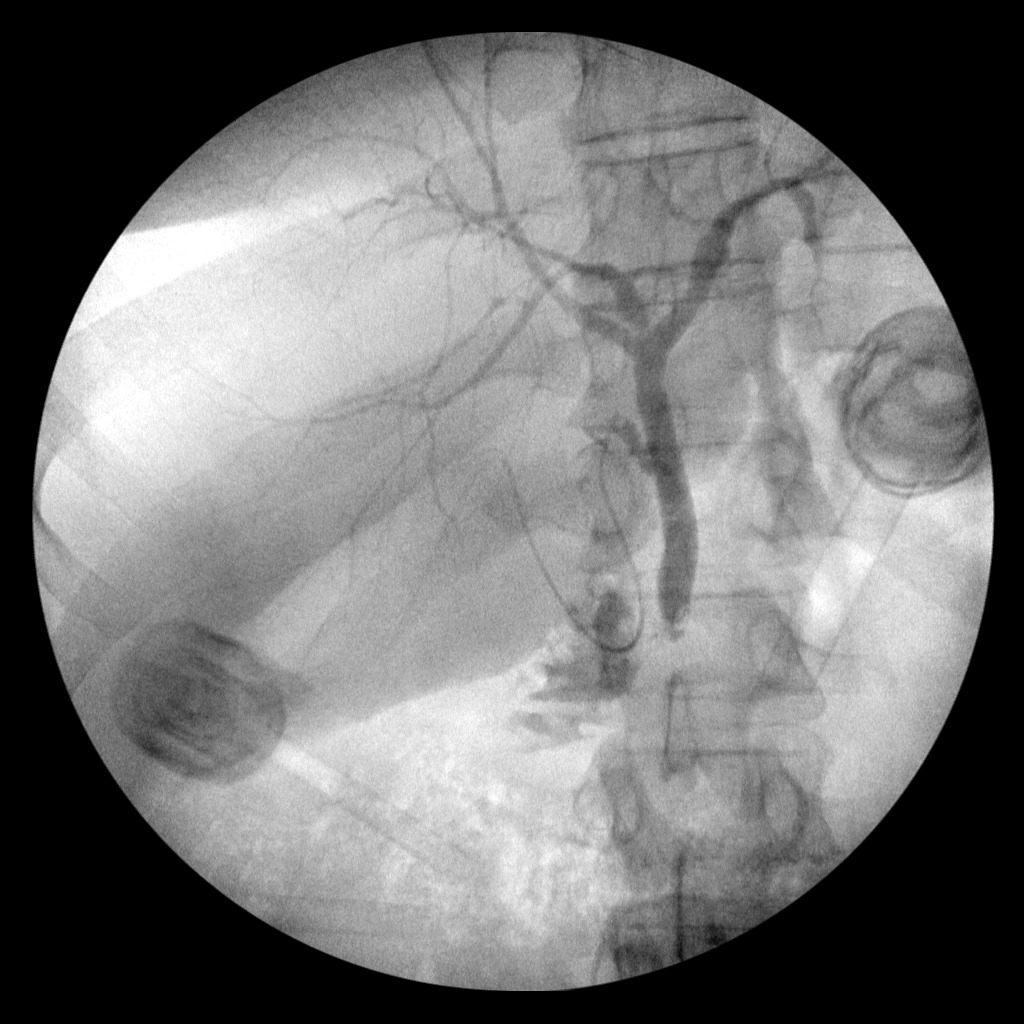

[4 of 4 positions shown; findings below may reference images not displayed]

FINDINGS: Surgical clips at the level of the cystic duct which is cannulated.
Contrast injection during fluoroscopic observation fills the intra
and extrahepatic biliary tree which appears nondilated. There is
transit of contrast through the distal CBD into the duodenum.
However, there is a rounded filling defect in the distal CBD which
becomes visible at the same time the intrahepatic ducts or filling,
and persist throughout the study migrating distally over the course
of the injection.
IMPRESSION: Appearance suspicious for distal choledocholithiasis. These images
were discussed by telephone with Dr. JOEBOOM on [DATE] at
[QA] hours.

## 2017-09-06 SURGERY — LAPAROSCOPIC CHOLECYSTECTOMY WITH INTRAOPERATIVE CHOLANGIOGRAM
Anesthesia: General | Site: Abdomen

## 2017-09-06 MED ORDER — SITAGLIPTIN PHOS-METFORMIN HCL 50-1000 MG PO TABS: 1.0000 | ORAL_TABLET | Freq: Two times a day (BID) | ORAL | Status: DC

## 2017-09-06 MED ORDER — OXYCODONE HCL 5 MG PO TABS: 5.0000 mg | ORAL_TABLET | Freq: Once | ORAL | Status: DC | PRN

## 2017-09-06 MED ORDER — OXYCODONE HCL 5 MG PO TABS: 5.0000 mg | ORAL_TABLET | ORAL | Status: DC | PRN

## 2017-09-06 MED ORDER — FENTANYL CITRATE (PF) 250 MCG/5ML IJ SOLN
INTRAMUSCULAR | Status: AC
  Filled 2017-09-06: qty 5

## 2017-09-06 MED ORDER — ACETAMINOPHEN 650 MG RE SUPP: 650.0000 mg | Freq: Four times a day (QID) | RECTAL | Status: DC | PRN

## 2017-09-06 MED ORDER — 0.9 % SODIUM CHLORIDE (POUR BTL) OPTIME
TOPICAL | Status: DC | PRN
  Administered 2017-09-06: 1000 mL

## 2017-09-06 MED ORDER — METFORMIN HCL 500 MG PO TABS
1000.0000 mg | ORAL_TABLET | Freq: Two times a day (BID) | ORAL | Status: DC
  Administered 2017-09-06 – 2017-09-07 (×2): 1000 mg via ORAL
  Filled 2017-09-06 (×2): qty 2

## 2017-09-06 MED ORDER — BUPIVACAINE-EPINEPHRINE 0.5% -1:200000 IJ SOLN
INTRAMUSCULAR | Status: DC | PRN
  Administered 2017-09-06: 20 mL

## 2017-09-06 MED ORDER — PANTOPRAZOLE SODIUM 40 MG PO TBEC
40.0000 mg | DELAYED_RELEASE_TABLET | Freq: Every day | ORAL | Status: DC
  Administered 2017-09-07: 40 mg via ORAL
  Filled 2017-09-06 (×2): qty 1

## 2017-09-06 MED ORDER — LAMOTRIGINE 100 MG PO TABS
200.0000 mg | ORAL_TABLET | Freq: Every day | ORAL | Status: DC
  Administered 2017-09-06: 200 mg via ORAL
  Filled 2017-09-06: qty 2

## 2017-09-06 MED ORDER — LACTATED RINGERS IV SOLN
INTRAVENOUS | Status: DC | PRN
  Administered 2017-09-06 (×2): via INTRAVENOUS

## 2017-09-06 MED ORDER — DEXAMETHASONE SODIUM PHOSPHATE 10 MG/ML IJ SOLN
INTRAMUSCULAR | Status: AC
  Filled 2017-09-06: qty 1

## 2017-09-06 MED ORDER — INSULIN GLARGINE 100 UNIT/ML ~~LOC~~ SOLN
20.0000 [IU] | Freq: Every day | SUBCUTANEOUS | Status: DC
  Administered 2017-09-06: 20 [IU] via SUBCUTANEOUS
  Filled 2017-09-06 (×2): qty 0.2

## 2017-09-06 MED ORDER — ONDANSETRON HCL 4 MG/2ML IJ SOLN
INTRAMUSCULAR | Status: AC
  Filled 2017-09-06: qty 2

## 2017-09-06 MED ORDER — GLIMEPIRIDE 4 MG PO TABS
4.0000 mg | ORAL_TABLET | Freq: Two times a day (BID) | ORAL | Status: DC
  Administered 2017-09-06 – 2017-09-07 (×2): 4 mg via ORAL
  Filled 2017-09-06 (×3): qty 1

## 2017-09-06 MED ORDER — ROCURONIUM BROMIDE 10 MG/ML (PF) SYRINGE
PREFILLED_SYRINGE | INTRAVENOUS | Status: AC
  Filled 2017-09-06: qty 5

## 2017-09-06 MED ORDER — OXYCODONE HCL 5 MG/5ML PO SOLN
5.0000 mg | Freq: Once | ORAL | Status: DC | PRN
  Filled 2017-09-06: qty 5

## 2017-09-06 MED ORDER — FENTANYL CITRATE (PF) 250 MCG/5ML IJ SOLN
INTRAMUSCULAR | Status: DC | PRN
  Administered 2017-09-06 (×2): 50 ug via INTRAVENOUS
  Administered 2017-09-06: 100 ug via INTRAVENOUS
  Administered 2017-09-06: 50 ug via INTRAVENOUS

## 2017-09-06 MED ORDER — LISINOPRIL 5 MG PO TABS
5.0000 mg | ORAL_TABLET | Freq: Every day | ORAL | Status: DC
  Administered 2017-09-06: 5 mg via ORAL
  Filled 2017-09-06: qty 1

## 2017-09-06 MED ORDER — SUGAMMADEX SODIUM 500 MG/5ML IV SOLN
INTRAVENOUS | Status: AC
  Filled 2017-09-06: qty 5

## 2017-09-06 MED ORDER — HYDROMORPHONE HCL 1 MG/ML IJ SOLN
INTRAMUSCULAR | Status: AC
  Administered 2017-09-06: 0.5 mg
  Filled 2017-09-06: qty 2

## 2017-09-06 MED ORDER — IOPAMIDOL (ISOVUE-300) INJECTION 61%
INTRAVENOUS | Status: AC
  Filled 2017-09-06: qty 50

## 2017-09-06 MED ORDER — DEXAMETHASONE SODIUM PHOSPHATE 10 MG/ML IJ SOLN
INTRAMUSCULAR | Status: DC | PRN
  Administered 2017-09-06: 10 mg via INTRAVENOUS

## 2017-09-06 MED ORDER — SUGAMMADEX SODIUM 500 MG/5ML IV SOLN
INTRAVENOUS | Status: DC | PRN
  Administered 2017-09-06: 225 mg via INTRAVENOUS

## 2017-09-06 MED ORDER — SCOPOLAMINE 1 MG/3DAYS TD PT72
MEDICATED_PATCH | TRANSDERMAL | Status: DC | PRN
  Administered 2017-09-06: 1 via TRANSDERMAL

## 2017-09-06 MED ORDER — CHLORHEXIDINE GLUCONATE CLOTH 2 % EX PADS: 6.0000 | MEDICATED_PAD | Freq: Once | CUTANEOUS | Status: DC

## 2017-09-06 MED ORDER — PROPOFOL 10 MG/ML IV BOLUS
INTRAVENOUS | Status: AC
  Filled 2017-09-06: qty 20

## 2017-09-06 MED ORDER — INSULIN ASPART 100 UNIT/ML ~~LOC~~ SOLN
0.0000 [IU] | Freq: Three times a day (TID) | SUBCUTANEOUS | Status: DC
Start: 2017-09-06 — End: 2017-09-07
  Administered 2017-09-06: 8 [IU] via SUBCUTANEOUS
  Administered 2017-09-06: 11 [IU] via SUBCUTANEOUS
  Administered 2017-09-07: 2 [IU] via SUBCUTANEOUS
  Administered 2017-09-07: 3 [IU] via SUBCUTANEOUS

## 2017-09-06 MED ORDER — RISPERIDONE 1 MG PO TABS
2.0000 mg | ORAL_TABLET | Freq: Every day | ORAL | Status: DC
  Administered 2017-09-06: 2 mg via ORAL
  Filled 2017-09-06: qty 2

## 2017-09-06 MED ORDER — SCOPOLAMINE 1 MG/3DAYS TD PT72
MEDICATED_PATCH | TRANSDERMAL | Status: AC
  Filled 2017-09-06: qty 1

## 2017-09-06 MED ORDER — HYDROMORPHONE HCL 1 MG/ML IJ SOLN: 1.0000 mg | INTRAMUSCULAR | Status: DC | PRN

## 2017-09-06 MED ORDER — GABAPENTIN 400 MG PO CAPS
400.0000 mg | ORAL_CAPSULE | Freq: Two times a day (BID) | ORAL | Status: DC
  Administered 2017-09-06 – 2017-09-07 (×3): 400 mg via ORAL
  Filled 2017-09-06 (×3): qty 1

## 2017-09-06 MED ORDER — PROPOFOL 10 MG/ML IV BOLUS
INTRAVENOUS | Status: DC | PRN
  Administered 2017-09-06: 180 mg via INTRAVENOUS

## 2017-09-06 MED ORDER — LACTATED RINGERS IR SOLN
Status: DC | PRN
  Administered 2017-09-06: 1000 mL

## 2017-09-06 MED ORDER — LIDOCAINE 2% (20 MG/ML) 5 ML SYRINGE
INTRAMUSCULAR | Status: AC
  Filled 2017-09-06: qty 5

## 2017-09-06 MED ORDER — ROCURONIUM BROMIDE 10 MG/ML (PF) SYRINGE
PREFILLED_SYRINGE | INTRAVENOUS | Status: DC | PRN
  Administered 2017-09-06: 50 mg via INTRAVENOUS

## 2017-09-06 MED ORDER — IOPAMIDOL (ISOVUE-300) INJECTION 61%
INTRAVENOUS | Status: DC | PRN
  Administered 2017-09-06: 3.5 mL via INTRAVENOUS

## 2017-09-06 MED ORDER — HYDROMORPHONE HCL 1 MG/ML IJ SOLN
0.2500 mg | INTRAMUSCULAR | Status: DC | PRN
  Administered 2017-09-06 (×2): 0.5 mg via INTRAVENOUS

## 2017-09-06 MED ORDER — MIDAZOLAM HCL 5 MG/5ML IJ SOLN
INTRAMUSCULAR | Status: DC | PRN
  Administered 2017-09-06: 2 mg via INTRAVENOUS

## 2017-09-06 MED ORDER — LINAGLIPTIN 5 MG PO TABS
5.0000 mg | ORAL_TABLET | Freq: Two times a day (BID) | ORAL | Status: DC
  Administered 2017-09-06 – 2017-09-07 (×2): 5 mg via ORAL
  Filled 2017-09-06 (×2): qty 1

## 2017-09-06 MED ORDER — MIDAZOLAM HCL 2 MG/2ML IJ SOLN
INTRAMUSCULAR | Status: AC
  Filled 2017-09-06: qty 2

## 2017-09-06 MED ORDER — POTASSIUM CHLORIDE IN NACL 20-0.9 MEQ/L-% IV SOLN
INTRAVENOUS | Status: DC
  Administered 2017-09-06: 11:00:00 via INTRAVENOUS
  Filled 2017-09-06: qty 1000

## 2017-09-06 MED ORDER — BUPROPION HCL ER (XL) 150 MG PO TB24
150.0000 mg | ORAL_TABLET | Freq: Every day | ORAL | Status: DC
  Administered 2017-09-06: 150 mg via ORAL
  Filled 2017-09-06: qty 1

## 2017-09-06 MED ORDER — LEVOTHYROXINE SODIUM 25 MCG PO TABS
137.0000 ug | ORAL_TABLET | Freq: Every day | ORAL | Status: DC
  Administered 2017-09-07: 137 ug via ORAL
  Filled 2017-09-06: qty 1

## 2017-09-06 MED ORDER — LIDOCAINE 2% (20 MG/ML) 5 ML SYRINGE
INTRAMUSCULAR | Status: DC | PRN
  Administered 2017-09-06: 100 mg via INTRAVENOUS

## 2017-09-06 MED ORDER — ACETAMINOPHEN 325 MG PO TABS
650.0000 mg | ORAL_TABLET | Freq: Four times a day (QID) | ORAL | Status: DC | PRN
  Administered 2017-09-06: 650 mg via ORAL
  Filled 2017-09-06: qty 2

## 2017-09-06 MED ORDER — MEPERIDINE HCL 50 MG/ML IJ SOLN: 6.2500 mg | INTRAMUSCULAR | Status: DC | PRN

## 2017-09-06 MED ORDER — GABAPENTIN 400 MG PO CAPS: 400.0000 mg | ORAL_CAPSULE | Freq: Three times a day (TID) | ORAL | Status: DC

## 2017-09-06 MED ORDER — PROMETHAZINE HCL 25 MG/ML IJ SOLN: 6.2500 mg | INTRAMUSCULAR | Status: DC | PRN

## 2017-09-06 MED ORDER — GABAPENTIN 400 MG PO CAPS
1200.0000 mg | ORAL_CAPSULE | Freq: Every day | ORAL | Status: DC
  Administered 2017-09-06: 1200 mg via ORAL
  Filled 2017-09-06: qty 3

## 2017-09-06 MED ORDER — BUPIVACAINE-EPINEPHRINE (PF) 0.5% -1:200000 IJ SOLN
INTRAMUSCULAR | Status: AC
  Filled 2017-09-06: qty 30

## 2017-09-06 MED ORDER — ONDANSETRON HCL 4 MG/2ML IJ SOLN
INTRAMUSCULAR | Status: DC | PRN
  Administered 2017-09-06: 4 mg via INTRAVENOUS

## 2017-09-06 MED ORDER — TRAMADOL HCL 50 MG PO TABS
50.0000 mg | ORAL_TABLET | Freq: Four times a day (QID) | ORAL | Status: DC | PRN
  Administered 2017-09-06 – 2017-09-07 (×4): 50 mg via ORAL
  Filled 2017-09-06 (×4): qty 1

## 2017-09-06 MED ORDER — POTASSIUM CHLORIDE 10 MEQ/100ML IV SOLN
INTRAVENOUS | Status: AC
  Filled 2017-09-06: qty 200

## 2017-09-06 SURGICAL SUPPLY — 39 items
ADH SKN CLS APL DERMABOND .7 (GAUZE/BANDAGES/DRESSINGS) ×1
APPLIER CLIP ROT 10 11.4 M/L (STAPLE) ×2
APR CLP MED LRG 11.4X10 (STAPLE) ×1
BAG SPEC RTRVL LRG 6X4 10 (ENDOMECHANICALS) ×1
CABLE HIGH FREQUENCY MONO STRZ (ELECTRODE) ×2 IMPLANT
CHLORAPREP W/TINT 26ML (MISCELLANEOUS) ×3 IMPLANT
CLIP APPLIE ROT 10 11.4 M/L (STAPLE) ×1 IMPLANT
COVER MAYO STAND STRL (DRAPES) ×2 IMPLANT
COVER SURGICAL LIGHT HANDLE (MISCELLANEOUS) ×2 IMPLANT
DERMABOND ADVANCED (GAUZE/BANDAGES/DRESSINGS) ×1
DERMABOND ADVANCED .7 DNX12 (GAUZE/BANDAGES/DRESSINGS) ×1 IMPLANT
DRAPE C-ARM 42X120 X-RAY (DRAPES) ×2 IMPLANT
ELECT REM PT RETURN 15FT ADLT (MISCELLANEOUS) ×2 IMPLANT
GAUZE SPONGE 2X2 8PLY STRL LF (GAUZE/BANDAGES/DRESSINGS) ×1 IMPLANT
GLOVE BIOGEL PI IND STRL 6.5 (GLOVE) IMPLANT
GLOVE BIOGEL PI IND STRL 7.0 (GLOVE) IMPLANT
GLOVE BIOGEL PI INDICATOR 6.5 (GLOVE) ×2
GLOVE BIOGEL PI INDICATOR 7.0 (GLOVE) ×4
GLOVE SURG ORTHO 8.0 STRL STRW (GLOVE) ×2 IMPLANT
GOWN STRL REUS W/ TWL LRG LVL3 (GOWN DISPOSABLE) ×2 IMPLANT
GOWN STRL REUS W/TWL LRG LVL3 (GOWN DISPOSABLE) ×4
GOWN STRL REUS W/TWL XL LVL3 (GOWN DISPOSABLE) ×4 IMPLANT
HEMOSTAT SNOW SURGICEL 2X4 (HEMOSTASIS) ×1 IMPLANT
KIT BASIN OR (CUSTOM PROCEDURE TRAY) ×2 IMPLANT
POUCH SPECIMEN RETRIEVAL 10MM (ENDOMECHANICALS) ×2 IMPLANT
SCISSORS LAP 5X35 DISP (ENDOMECHANICALS) ×2 IMPLANT
SET CHOLANGIOGRAPH MIX (MISCELLANEOUS) ×2 IMPLANT
SET IRRIG TUBING LAPAROSCOPIC (IRRIGATION / IRRIGATOR) ×2 IMPLANT
SLEEVE XCEL OPT CAN 5 100 (ENDOMECHANICALS) ×2 IMPLANT
SPONGE GAUZE 2X2 STER 10/PKG (GAUZE/BANDAGES/DRESSINGS)
STRIP CLOSURE SKIN 1/2X4 (GAUZE/BANDAGES/DRESSINGS) ×1 IMPLANT
SUT MNCRL AB 4-0 PS2 18 (SUTURE) ×3 IMPLANT
TOWEL OR 17X26 10 PK STRL BLUE (TOWEL DISPOSABLE) ×2 IMPLANT
TOWEL OR NON WOVEN STRL DISP B (DISPOSABLE) ×2 IMPLANT
TRAY LAPAROSCOPIC (CUSTOM PROCEDURE TRAY) ×2 IMPLANT
TROCAR BLADELESS OPT 5 100 (ENDOMECHANICALS) ×2 IMPLANT
TROCAR XCEL BLUNT TIP 100MML (ENDOMECHANICALS) ×2 IMPLANT
TROCAR XCEL NON-BLD 11X100MML (ENDOMECHANICALS) ×2 IMPLANT
TUBING INSUF HEATED (TUBING) ×1 IMPLANT

## 2017-09-06 NOTE — H&P (View-Only) (Signed)
[facility] Reason for consult: CBD stone and cirrhosis Referring Physician: Dr. Harlow Asa.  Primary GI: Dr. Dianna Rossetti Rhonda Higgins is an 47 y.o. female.  HPI: She was found to have gallstones and was referred to Dr. Harlow Asa for laparoscopic cholecystectomy.  This was performed earlier this morning.  Intraoperative cholangiogram showed probable CBD stone.  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  This was not expected since she is a nondrinker and has never had hepatitis or any other risk factors for cirrhosis.  She has had a history of elevated liver enzymes and fatty liver on ultrasound.  This presumably is the etiology of her cirrhosis.  Past Medical History:  Diagnosis Date  . Adenocarcinoma (HCC)    endometrial, FIGO GRADE 1  . Allergic rhinitis   . Atypical chest pain    History of  . Depression   . Elevated liver enzymes   . GERD (gastroesophageal reflux disease)   . Hematuria   . History of endometrial cancer 08-02-2009   oncologist-  dr brewster/ Den[place] George and dr kinard/  no recurrence   endometrial adenocarinoma Stage 1B, Grade 1, FIGO--  s/p  TAH w/ BSO and pelvic lymph node dissection's and radiation therapy  . History of kidney stones   . History of radiation therapy    2011  pelvic intracavity brachytherapy treatment's for endometrial carcinoma  . History of thyroid nodule    multinodular goiter s/p  total thyroidectomy 11-19-2015  per pathology -  adenomatoid nodules  . Hyperlipidemia   . Hypothyroidism, postsurgical   . 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 Left ureteral stone   . Obesity   . OSA (obstructive sleep apnea)    severe OSA  per study 03-08-2010--  noncomplant cpap  . Overactive bladder   . Personality disorder ([place])   . Polyphagia(783.6)   . PONV (postoperative nausea and vomiting)    after ear surgery only one time  . Right lower quadrant pain   . Urgency of urination    . UTI (urinary tract infection)     Past Surgical History:  Procedure Laterality Date  . CARDIOVASCULAR STRESS TEST  06/09/2008   normal nuclear study w/ no ischemia/  normal LV function and wall motion , ef 83%  . CYSTOSCOPY/RETROGRADE/URETEROSCOPY/STONE EXTRACTION WITH BASKET Left 03/08/2016   Procedure: CYSTOSCOPY/RETROGRADE/URETEROSCOPY/STONE EXTRACTION WITH BASKET, STENT PLACEMENT;  Surgeon: Rana Snare, MD;  Location[facility];  Service: Urology;  Laterality: Left;  . ENDOMETRIAL BIOPSY    . HOLMIUM LASER APPLICATION Left 1/96/2229   Procedure: HOLMIUM LASER APPLICATION;  Surgeon: Rana Snare, MD;  Location[facility];  Service: Urology;  Laterality: Left;  . MOUTH SURGERY    . MYRINGECOTMY W/ REMOVAL MIDDLE EAR CHOLESTEATOMA TYPE 1 FASICA TYMPANOPLASTY  09/13/2000  . ROBOTIC ASSISTED TOTAL HYSTERECTOMY WITH BILATERAL SALPINGO OOPHERECTOMY  08-02-2009   at [facility]  dr Denman George   w/  Bilateral pelvic and para aortic lymph node dissection's  . THYROIDECTOMY N/A 11/19/2015   Procedure: TOTAL THYROIDECTOMY;  Surgeon: Armandina Gemma, MD;  Location: [facility];  Service: General;  Laterality: N/A;  . TONSILLECTOMY  age 17  . TRANSTHORACIC ECHOCARDIOGRAM  07/19/2014   ef 55-60%/  trivial TR  . TYMPANOPLASTY Right 1993    Family History  Problem Relation Age of Onset  . Heart disease Sister   . Heart attack Brother   . Heart disease Brother   .  Heart disease Sister   . Diabetes Sister   . Asthma Mother   . Heart disease Mother   . Diabetes Mother   . Emphysema Mother   . Hypertension Mother   . Stroke Mother   . Prostate cancer Father     Social History:  reports that she has never smoked. She has never used smokeless tobacco. She reports that she does not drink alcohol or use drugs.  Allergies:  Allergies  Allergen Reactions  . Hydrocodone Shortness Of Breath  . Codeine Itching  . Adhesive [Tape] Rash    Paper tape is ok    Medications; Prior  to Admission medications   Medication Sig Start Date End Date Taking? Authorizing Provider  Ascorbic Acid (VITAMIN C) 1000 MG tablet Take 1,000 mg by mouth daily.   Yes [provider]  buPROPion (WELLBUTRIN XL) 150 MG 24 hr tablet Take 150 mg by mouth at bedtime.    Yes [provider]  cetirizine (ZYRTEC) 10 MG tablet Take 10 mg by mouth at bedtime.    Yes [provider]  gabapentin (NEURONTIN) 400 MG capsule Take 400-1,200 mg by mouth 3 (three) times daily. Pt takes one capsule in the morning, one in the afternoon, and three at bedtime.   Yes [provider]  glimepiride (AMARYL) 4 MG tablet Take 4 mg by mouth 2 (two) times daily.    Yes [provider]  ibuprofen (ADVIL,MOTRIN) 800 MG tablet Take 800 mg by mouth every 8 (eight) hours as needed for moderate pain.   Yes [provider]  lamoTRIgine (LAMICTAL) 200 MG tablet Take 200 mg by mouth at bedtime.    Yes [provider]  LANTUS SOLOSTAR 100 UNIT/ML Solostar Pen Inject 20 Units into the skin at bedtime. 11/03/16  Yes [provider]  levothyroxine (SYNTHROID, LEVOTHROID) 137 MCG tablet Take 137 mcg by mouth daily before breakfast.    Yes [provider]  lisinopril (PRINIVIL,ZESTRIL) 5 MG tablet Take 5 mg by mouth at bedtime.    Yes [provider]  Multiple Vitamins-Minerals (MULTIVITAMIN WITH MINERALS) tablet Take 1 tablet by mouth daily.   Yes [provider]  pantoprazole (PROTONIX) 40 MG tablet Take 40 mg by mouth every morning.    Yes [provider]  risperiDONE (RISPERDAL) 2 MG tablet Take 2 mg by mouth at bedtime. 08/22/16  Yes [provider]  simvastatin (ZOCOR) 40 MG tablet Take 40 mg by mouth at bedtime.    Yes [provider]  sitaGLIPtin-metformin (JANUMET) 50-1000 MG tablet Take 1 tablet by mouth 2 (two) times daily with a meal.   Yes [provider]  aspirin EC 81 MG tablet Take 162 mg by  mouth at bedtime.    [provider]   . buPROPion  150 mg Oral QHS  . gabapentin  1,200 mg Oral QHS  . gabapentin  400 mg Oral BID AC  . glimepiride  4 mg Oral BID WC  . insulin aspart  0-15 Units Subcutaneous TID WC  . insulin glargine  20 Units Subcutaneous QHS  . lamoTRIgine  200 mg Oral QHS  . [START ON 09/07/2017] levothyroxine  137 mcg Oral QAC breakfast  . linagliptin  5 mg Oral BID WC   And  . metFORMIN  1,000 mg Oral BID WC  . lisinopril  5 mg Oral QHS  . pantoprazole  40 mg Oral Daily  . risperiDONE  2 mg Oral QHS   PRN Meds acetaminophen **  OR** acetaminophen, HYDROmorphone (DILAUDID) injection, oxyCODONE, traMADol Results for orders placed or performed during the hospital encounter of 09/06/17 (from the past 48 hour(s))  Glucose, capillary     Status: Abnormal   Collection Time: 09/06/17  6:05 AM  Result Value Ref Range   Glucose-Capillary 161 (H) 65 - 99 mg/dL  Glucose, capillary     Status: Abnormal   Collection Time: 09/06/17  9:24 AM  Result Value Ref Range   Glucose-Capillary 189 (H) 65 - 99 mg/dL    Dg Cholangiogram Operative  Result Date: 09/06/2017 CLINICAL DATA:  47 year old female undergoing cholecystectomy. Intraoperative cholangiogram. EXAM: INTRAOPERATIVE CHOLANGIOGRAM TECHNIQUE: Cholangiographic images from the C-arm fluoroscopic device were submitted for interpretation post-operatively. Please see the procedural report for the amount of contrast and the fluoroscopy time utilized. COMPARISON:  Abdomen ultrasound 07/05/2017 FLUOROSCOPY TIME:  0 minutes 20 seconds FINDINGS: Surgical clips at the level of the cystic duct which is cannulated. Contrast injection during fluoroscopic observation fills the intra and extrahepatic biliary tree which appears nondilated. There is transit of contrast through the distal CBD into the duodenum. However, there is a rounded filling defect in the distal CBD which becomes visible at the same time the intrahepatic ducts  or filling, and persist throughout the study migrating distally over the course of the injection. IMPRESSION: Appearance suspicious for distal choledocholithiasis. These images were discussed by telephone with Dr. Armandina Gemma on 09/06/2017 at 0843 hours. Electronically Signed   By: Genevie Ann M.D.   On: 09/06/2017 08:49   ROS: As above no history of alcohol use, sexual exposure to anyone with hepatitis or prior history of hepatitis.            Blood pressure 137/80, pulse (!) 110, temperature 98.5 F (36.9 C), temperature source Oral, resp. rate 16, height 5\' 3"  (1.6 m), weight 105.7 kg (233 lb), SpO2 96 %.  Physical exam:   General--alert white female in no distress.  Complaining of some upper discomfort. ENT--nonicteric Neck--very full but supple Heart--regular rate and rhythm without murmurs or gallops Lungs--clear Abdomen--nondistended with only a few bowel sounds.  Still somewhat tender in the right upper quadrant Psych--alert and oriented and fully awake.  Answers questions appropriately   Assessment: 1.  Probable CBD stone.  This is noted on IOC. 2.  Status post laparoscopic cholecystectomy 3.  Cirrhosis of the liver.  Probably due to steatohepatitis.  Will need workup to rule out other causes 4.  Diabetes 5.  Status post hysterectomy due to endometrial cancer 6.  GERD 7.  History of thyroidectomy 8.  OSA  Plan: 1.  We will proceed with ERCP with sphincterotomy and stone extraction at 9:30 in the morning.  This procedure will be performed by Dr. Therisa Doyne..  Have discussed extensively with the patient and her wife.  The risk of bleeding and pancreatitis and the possibility of problems of stones or not removed were all discussed with them. 2.  Patient has no significant alcohol history and most likely etiology is steatohepatitis as a cause of her cirrhosis.  We will go ahead and obtain labs for viral hepatitis, autoimmune hepatitis etc. while she is here in the  hospital.   Nancy Fetter 09/06/2017, 12:40 PM   This note was created using voice recognition software and minor errors may Have occurred unintentionally. Pager: [phone] If no answer or after hours call [phone]

## 2017-09-06 NOTE — Anesthesia Preprocedure Evaluation (Signed)
Anesthesia Evaluation  Patient identified by MRN, date of birth, ID band Patient awake    Reviewed: Allergy & Precautions, H&P , NPO status , Patient's Chart, lab work & pertinent test results  History of Anesthesia Complications (+) PONV  Airway Mallampati: II   Neck ROM: full    Dental  (+) Missing, Teeth Intact,    Pulmonary sleep apnea ,    breath sounds clear to auscultation       Cardiovascular negative cardio ROS   Rhythm:regular Rate:Normal     Neuro/Psych PSYCHIATRIC DISORDERS Anxiety Depression    GI/Hepatic GERD  Medicated and Controlled,  Endo/Other  diabetes, Type 2, Oral Hypoglycemic AgentsHypothyroidism Morbid obesity  Renal/GU stones     Musculoskeletal   Abdominal   Peds  Hematology   Anesthesia Other Findings   Reproductive/Obstetrics                             Anesthesia Physical  Anesthesia Plan  ASA: III  Anesthesia Plan: General   Post-op Pain Management:    Induction: Intravenous  PONV Risk Score and Plan: 4 or greater and Ondansetron, Dexamethasone, Midazolam and Scopolamine patch - Pre-op  Airway Management Planned: Oral ETT  Additional Equipment:   Intra-op Plan:   Post-operative Plan: Extubation in OR  Informed Consent: I have reviewed the patients History and Physical, chart, labs and discussed the procedure including the risks, benefits and alternatives for the proposed anesthesia with the patient or authorized representative who has indicated his/her understanding and acceptance.     Plan Discussed with: CRNA, Anesthesiologist and Surgeon  Anesthesia Plan Comments:         Anesthesia Quick Evaluation

## 2017-09-06 NOTE — Anesthesia Procedure Notes (Signed)
Procedure Name: Intubation Date/Time: 09/06/2017 7:44 AM Performed by: Kaylene Dawn D, CRNA Pre-anesthesia Checklist: Patient identified, Emergency Drugs available, Suction available and Patient being monitored Patient Re-evaluated:Patient Re-evaluated prior to induction Oxygen Delivery Method: Circle system utilized Preoxygenation: Pre-oxygenation with 100% oxygen Induction Type: IV induction Ventilation: Mask ventilation without difficulty Laryngoscope Size: Mac and 4 Grade View: Grade I Tube type: Oral Tube size: 7.5 mm Number of attempts: 1 Airway Equipment and Method: Stylet Placement Confirmation: ETT inserted through vocal cords under direct vision,  positive ETCO2 and breath sounds checked- equal and bilateral Secured at: 22 cm Tube secured with: Tape Dental Injury: Teeth and Oropharynx as per pre-operative assessment

## 2017-09-06 NOTE — Consult Note (Signed)
[facility] Reason for consult: CBD stone and cirrhosis Referring Physician: Dr. Harlow Higgins.  Primary GI: Dr. Dianna Higgins Rhonda Higgins is an 47 y.o. female.  HPI: She was found to have gallstones and was referred to Rhonda Higgins for laparoscopic cholecystectomy.  This was performed earlier this morning.  Intraoperative cholangiogram showed probable CBD stone.  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  This was not expected since she is a nondrinker and has never had hepatitis or any other risk factors for cirrhosis.  She has had a history of elevated liver enzymes and fatty liver on ultrasound.  This presumably is the etiology of her cirrhosis.  Past Medical History:  Diagnosis Date  . Adenocarcinoma (HCC)    endometrial, FIGO GRADE 1  . Allergic rhinitis   . Atypical chest pain    History of  . Depression   . Elevated liver enzymes   . GERD (gastroesophageal reflux disease)   . Hematuria   . History of endometrial cancer 08-02-2009   oncologist-  dr brewster/ Denman George and dr kinard/  no recurrence   endometrial adenocarinoma Stage 1B, Grade 1, FIGO--  s/p  TAH w/ BSO and pelvic lymph node dissection's and radiation therapy  . History of kidney stones   . History of radiation therapy    2011  pelvic intracavity brachytherapy treatment's for endometrial carcinoma  . History of thyroid nodule    multinodular goiter s/p  total thyroidectomy 11-19-2015  per pathology -  adenomatoid nodules  . Hyperlipidemia   . Hypothyroidism, postsurgical   . 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 Left ureteral stone   . Obesity   . OSA (obstructive sleep apnea)    severe OSA  per study 03-08-2010--  noncomplant cpap  . Overactive bladder   . Personality disorder ([place])   . Polyphagia(783.6)   . PONV (postoperative nausea and vomiting)    after ear surgery only one time  . Right lower quadrant pain   . Urgency of urination    . UTI (urinary tract infection)     Past Surgical History:  Procedure Laterality Date  . CARDIOVASCULAR STRESS TEST  06/09/2008   normal nuclear study w/ no ischemia/  normal LV function and wall motion , ef 83%  . CYSTOSCOPY/RETROGRADE/URETEROSCOPY/STONE EXTRACTION WITH BASKET Left 03/08/2016   Procedure: CYSTOSCOPY/RETROGRADE/URETEROSCOPY/STONE EXTRACTION WITH BASKET, STENT PLACEMENT;  Surgeon: Rana Snare, MD;  Location: [facility];  Service: Urology;  Laterality: Left;  . ENDOMETRIAL BIOPSY    . HOLMIUM LASER APPLICATION Left 5/68/1275   Procedure: HOLMIUM LASER APPLICATION;  Surgeon: Rana Snare, MD;  Location: [facility];  Service: Urology;  Laterality: Left;  . MOUTH SURGERY    . MYRINGECOTMY W/ REMOVAL MIDDLE EAR CHOLESTEATOMA TYPE 1 FASICA TYMPANOPLASTY  09/13/2000  . ROBOTIC ASSISTED TOTAL HYSTERECTOMY WITH BILATERAL SALPINGO OOPHERECTOMY  08-02-2009   at [facility]  dr Denman George   w/  Bilateral pelvic and para aortic lymph node dissection's  . THYROIDECTOMY N/A 11/19/2015   Procedure: TOTAL THYROIDECTOMY;  Surgeon: Rhonda Gemma, MD;  Location: [facility];  Service: General;  Laterality: N/A;  . TONSILLECTOMY  age 64  . TRANSTHORACIC ECHOCARDIOGRAM  07/19/2014   ef 55-60%/  trivial TR  . TYMPANOPLASTY Right 1993    Family History  Problem Relation Age of Onset  . Heart disease Sister   . Heart attack Brother   . Heart disease Brother   .  Heart disease Sister   . Diabetes Sister   . Asthma Mother   . Heart disease Mother   . Diabetes Mother   . Emphysema Mother   . Hypertension Mother   . Stroke Mother   . Prostate cancer Father     Social History:  reports that she has never smoked. She has never used smokeless tobacco. She reports that she does not drink alcohol or use drugs.  Allergies:  Allergies  Allergen Reactions  . Hydrocodone Shortness Of Breath  . Codeine Itching  . Adhesive [Tape] Rash    Paper tape is ok    Medications; Prior  to Admission medications   Medication Sig Start Date End Date Taking? Authorizing Provider  Ascorbic Acid (VITAMIN C) 1000 MG tablet Take 1,000 mg by mouth daily.   Yes [provider]  buPROPion (WELLBUTRIN XL) 150 MG 24 hr tablet Take 150 mg by mouth at bedtime.    Yes [provider]  cetirizine (ZYRTEC) 10 MG tablet Take 10 mg by mouth at bedtime.    Yes [provider]  gabapentin (NEURONTIN) 400 MG capsule Take 400-1,200 mg by mouth 3 (three) times daily. Pt takes one capsule in the morning, one in the afternoon, and three at bedtime.   Yes [provider]  glimepiride (AMARYL) 4 MG tablet Take 4 mg by mouth 2 (two) times daily.    Yes [provider]  ibuprofen (ADVIL,MOTRIN) 800 MG tablet Take 800 mg by mouth every 8 (eight) hours as needed for moderate pain.   Yes [provider]  lamoTRIgine (LAMICTAL) 200 MG tablet Take 200 mg by mouth at bedtime.    Yes [provider]  LANTUS SOLOSTAR 100 UNIT/ML Solostar Pen Inject 20 Units into the skin at bedtime. 11/03/16  Yes [provider]  levothyroxine (SYNTHROID, LEVOTHROID) 137 MCG tablet Take 137 mcg by mouth daily before breakfast.    Yes [provider]  lisinopril (PRINIVIL,ZESTRIL) 5 MG tablet Take 5 mg by mouth at bedtime.    Yes [provider]  Multiple Vitamins-Minerals (MULTIVITAMIN WITH MINERALS) tablet Take 1 tablet by mouth daily.   Yes [provider]  pantoprazole (PROTONIX) 40 MG tablet Take 40 mg by mouth every morning.    Yes [provider]  risperiDONE (RISPERDAL) 2 MG tablet Take 2 mg by mouth at bedtime. 08/22/16  Yes [provider]  simvastatin (ZOCOR) 40 MG tablet Take 40 mg by mouth at bedtime.    Yes [provider]  sitaGLIPtin-metformin (JANUMET) 50-1000 MG tablet Take 1 tablet by mouth 2 (two) times daily with a meal.   Yes [provider]  aspirin EC 81 MG tablet Take 162 mg by  mouth at bedtime.    [provider]   . buPROPion  150 mg Oral QHS  . gabapentin  1,200 mg Oral QHS  . gabapentin  400 mg Oral BID AC  . glimepiride  4 mg Oral BID WC  . insulin aspart  0-15 Units Subcutaneous TID WC  . insulin glargine  20 Units Subcutaneous QHS  . lamoTRIgine  200 mg Oral QHS  . [START ON 09/07/2017] levothyroxine  137 mcg Oral QAC breakfast  . linagliptin  5 mg Oral BID WC   And  . metFORMIN  1,000 mg Oral BID WC  . lisinopril  5 mg Oral QHS  . pantoprazole  40 mg Oral Daily  . risperiDONE  2 mg Oral QHS   PRN Meds acetaminophen **  OR** acetaminophen, HYDROmorphone (DILAUDID) injection, oxyCODONE, traMADol Results for orders placed or performed during the hospital encounter of 09/06/17 (from the past 48 hour(s))  Glucose, capillary     Status: Abnormal   Collection Time: 09/06/17  6:05 AM  Result Value Ref Range   Glucose-Capillary 161 (H) 65 - 99 mg/dL  Glucose, capillary     Status: Abnormal   Collection Time: 09/06/17  9:24 AM  Result Value Ref Range   Glucose-Capillary 189 (H) 65 - 99 mg/dL    Dg Cholangiogram Operative  Result Date: 09/06/2017 CLINICAL DATA:  47 year old female undergoing cholecystectomy. Intraoperative cholangiogram. EXAM: INTRAOPERATIVE CHOLANGIOGRAM TECHNIQUE: Cholangiographic images from the C-arm fluoroscopic device were submitted for interpretation post-operatively. Please see the procedural report for the amount of contrast and the fluoroscopy time utilized. COMPARISON:  Abdomen ultrasound 07/05/2017 FLUOROSCOPY TIME:  0 minutes 20 seconds FINDINGS: Surgical clips at the level of the cystic duct which is cannulated. Contrast injection during fluoroscopic observation fills the intra and extrahepatic biliary tree which appears nondilated. There is transit of contrast through the distal CBD into the duodenum. However, there is a rounded filling defect in the distal CBD which becomes visible at the same time the intrahepatic ducts  or filling, and persist throughout the study migrating distally over the course of the injection. IMPRESSION: Appearance suspicious for distal choledocholithiasis. These images were discussed by telephone with Dr. Armandina Higgins on 09/06/2017 at 0843 hours. Electronically Signed   By: Genevie Ann M.D.   On: 09/06/2017 08:49   ROS: As above no history of alcohol use, sexual exposure to anyone with hepatitis or prior history of hepatitis.            Blood pressure 137/80, pulse (!) 110, temperature 98.5 F (36.9 C), temperature source Oral, resp. rate 16, height 5\' 3"  (1.6 m), weight 105.7 kg (233 lb), SpO2 96 %.  Physical exam:   General--alert white female in no distress.  Complaining of some upper discomfort. ENT--nonicteric Neck--very full but supple Heart--regular rate and rhythm without murmurs or gallops Lungs--clear Abdomen--nondistended with only a few bowel sounds.  Still somewhat tender in the right upper quadrant Psych--alert and oriented and fully awake.  Answers questions appropriately   Assessment: 1.  Probable CBD stone.  This is noted on IOC. 2.  Status post laparoscopic cholecystectomy 3.  Cirrhosis of the liver.  Probably due to steatohepatitis.  Will need workup to rule out other causes 4.  Diabetes 5.  Status post hysterectomy due to endometrial cancer 6.  GERD 7.  History of thyroidectomy 8.  OSA  Plan: 1.  We will proceed with ERCP with sphincterotomy and stone extraction at 9:30 in the morning.  This procedure will be performed by Dr. Therisa Doyne..  Have discussed extensively with the patient and her wife.  The risk of bleeding and pancreatitis and the possibility of problems of stones or not removed were all discussed with them. 2.  Patient has no significant alcohol history and most likely etiology is steatohepatitis as a cause of her cirrhosis.  We will go ahead and obtain labs for viral hepatitis, autoimmune hepatitis etc. while she is here in the  hospital.   Nancy Fetter 09/06/2017, 12:40 PM   This note was created using voice recognition software and minor errors may Have occurred unintentionally. Pager: [phone] If no answer or after hours call [phone]

## 2017-09-06 NOTE — Progress Notes (Signed)
General Surgery [facility]  Intraoperative findings include nodular cirrhosis of the liver which was previously undiagnosed.  Intraoperative cholangiography demonstrates suspicion of distal common bile duct stones.  I have contacted Dr. Wilford Corner.  He or one of his partners will see the patient in consultation today and address these two new findings.  Armandina Gemma, [facility] Office: [phone]

## 2017-09-06 NOTE — Op Note (Signed)
Procedure Note  Pre-operative Diagnosis:  Chronic cholecystitis, cholelithiasis  Post-operative Diagnosis:  Same, cirrhosis of liver, probable distal CBD stone  Surgeon:  Armandina Gemma, MD  Assistant:  none   Procedure:  Laparoscopic cholecystectomy with intra-operative cholangiography  Anesthesia:  General  Estimated Blood Loss:  minimal  Drains: none         Specimen: Gallbladder to pathology  Indications:  Patient is referred by Dr. Wilford Corner for surgical evaluation and management of symptomatic cholelithiasis and elevated liver function test. Patient has approximately a 2 year history of intermittent epigastric abdominal pain. She has a history of gastroesophageal reflux disease. Approximately 2 months ago she had a particularly severe episode of epigastric pain. She denies fevers or chills. She denies nausea or vomiting. She denies jaundice or acholic stools. She has had previous robotic hysterectomy. No other abdominal surgery. Recent liver function tests show mild elevation of transaminases with a normal total bilirubin. Patient has no history of hepatitis or pancreatitis. There is no family history of gallbladder disease. Patient presents today to discuss cholecystectomy. Recent ultrasound examination obtained by her gastroenterologist demonstrated multiple gallstones without evidence of acute infection or inflammation.  Procedure Details:  The patient was seen in the pre-op holding area. The risks, benefits, complications, treatment options, and expected outcomes were previously discussed with the patient. The patient agreed with the proposed plan and has signed the informed consent form.  The patient was brought to the Operating Room, identified as Rhonda Higgins and the procedure verified as laparoscopic cholecystectomy with intraoperative cholangiography. A "time out" was completed and the above information confirmed.  The patient was placed in the supine  position. Following induction of general anesthesia, the abdomen was prepped and draped in the usual aseptic fashion.  An incision was made in the skin near the umbilicus. The midline fascia was incised and the peritoneal cavity was entered and a Hasson canula was introduced under direct vision.  The Hasson canula was secured with a 0-Vicryl pursestring suture. Pneumoperitoneum was established with carbon dioxide. Additional trocars were introduced under direct vision along the right costal margin in the midline, mid-clavicular line, and anterior axillary line.   The liver was significantly diseased with nodular cirrhosis. The gallbladder was identified and the fundus grasped and retracted cephalad. Adhesions were taken down bluntly and the electrocautery was utilized as needed, taking care not to injure any adjacent structures. The infundibulum was grasped and retracted laterally, exposing the peritoneum overlying the triangle of Calot. The peritoneum was incised and structures exposed with blunt dissection. The cystic duct was clearly identified, bluntly dissected circumferentially, and clipped at the neck of the gallbladder.  An incision was made in the cystic duct and the cholangiogram catheter introduced. The catheter was secured using an ligaclip.  Real-time cholangiography was performed using C-arm fluoroscopy.  There was rapid filling of a normal caliber common bile duct.  There was reflux of contrast into the left and right hepatic ductal systems.  There was free flow distally into the duodenum without filling defect or obstruction.  The catheter was removed from the peritoneal cavity.  The cystic duct was then ligated with surgical clips and divided. The cystic artery was identified, dissected circumferentially, ligated with ligaclips, and divided.  The gallbladder was dissected away from the liver bed using the electrocautery for hemostasis. The gallbladder was completely removed from the liver  and placed into an endocatch bag. The gallbladder was removed in the endocatch bag through the umbilical port site and  submitted to pathology for review.  The right upper quadrant was irrigated and the gallbladder bed was inspected. Hemostasis was achieved with the electrocautery.  A full piece of "[name]" was placed in the gallbladder bed.  At the umbilical port site, there were omental adhesions.  There was some bleeding which was controlled with the eletrocautery.  Pneumoperitoneum was released after viewing removal of the trocars with good hemostasis noted. The umbilical wound was irrigated and the fascia was then closed with the pursestring suture.  Local anesthetic was infiltrated at all port sites. The skin incisions were closed with 4-0 Monocril subcuticular sutures and steri-strips and dressings were applied.  Instrument, sponge, and needle counts were correct at the conclusion of the case.  The patient was awakened from anesthesia and brought to the recovery room in stable condition.  The patient tolerated the procedure well.   Armandina Gemma, MD [facility] Office: [phone]

## 2017-09-06 NOTE — Anesthesia Postprocedure Evaluation (Signed)
Anesthesia Post Note  Patient: Rhonda Higgins  Procedure(s) Performed: LAPAROSCOPIC CHOLECYSTECTOMY WITH INTRAOPERATIVE CHOLANGIOGRAM (N/A Abdomen)     Patient location during evaluation: PACU Anesthesia Type: General Level of consciousness: awake and alert Pain management: pain level controlled Vital Signs Assessment: post-procedure vital signs reviewed and stable Respiratory status: spontaneous breathing, nonlabored ventilation and respiratory function stable Cardiovascular status: blood pressure returned to baseline and stable Postop Assessment: no apparent nausea or vomiting Anesthetic complications: no    Last Vitals:  Vitals Value Taken Time  BP    Temp    Pulse 113 09/06/2017 10:22 AM  Resp 21 09/06/2017 10:21 AM  SpO2 93 % 09/06/2017 10:22 AM  Vitals shown include unvalidated device data.  Last Pain:  Vitals:   09/06/17 1015  TempSrc:   PainSc: [place] Boyett

## 2017-09-06 NOTE — H&P (Signed)
General Surgery [facility]  Rhonda Higgins DOB: [birth year] Married / Language: English / Race: White Female   History of Present Illness  The patient is a 47 year old female who presents for evaluation of gall stones.  CC: symptomatic gallstones, abdominal pain, elevated LFT's  Patient is referred by Dr. Wilford Corner for surgical evaluation and management of symptomatic cholelithiasis and elevated liver function test. Patient has approximately a 2 year history of intermittent epigastric abdominal pain. She has a history of gastroesophageal reflux disease. Approximately 2 months ago she had a particularly severe episode of epigastric pain. She denies fevers or chills. She denies nausea or vomiting. She denies jaundice or acholic stools. She has had previous robotic hysterectomy. No other abdominal surgery. Recent liver function tests show mild elevation of transaminases with a normal total bilirubin. Patient has no history of hepatitis or pancreatitis. There is no family history of gallbladder disease. Patient presents today to discuss cholecystectomy. Recent ultrasound examination obtained by her gastroenterologist demonstrated multiple gallstones without evidence of acute infection or inflammation.   Problem List/Past Medical THYROID GOITER (E04.9)  CHOLELITHIASIS WITH CHRONIC CHOLECYSTITIS (K80.10)   Past Surgical History Thyroidectomy; Total  Hysterectomy (due to cancer) - Complete   Diagnostic Studies History Colonoscopy  never Mammogram  1-3 years ago  Allergies Codeine Phosphate *ANALGESICS - OPIOID*  HYDROcodone Bitartrate *CHEMICALS*   Medication History Lantus SoloStar (100UNIT/ML Soln Pen-inj, Subcutaneous) Active. RisperiDONE (2MG  Tablet, Oral) Active. Synthroid (137MCG Tablet, Oral) Active. Aspirin (81MG  Tablet, Oral) Active. Cetirizine HCl (10MG  Tablet, Oral) Active. BuPROPion HCl ER (XL) (150MG  Tablet ER  24HR, Oral) Active. Glimepiride (4MG  Tablet, Oral) Active. Gabapentin (400MG  Capsule, Oral) Active. Janumet (50-1000MG  Tablet, Oral) Active. Lisinopril (5MG  Tablet, Oral) Active. Simvastatin (40MG  Tablet, Oral) Active. Pantoprazole Sodium (40MG  Tablet DR, Oral) Active. LamoTRIgine (200MG  Tablet, Oral) Active. Ibuprofen (200MG  Tablet, Oral as needed) Active. Medications Reconciled  Social History  Alcohol use  Occasional alcohol use. Caffeine use  Coffee, Tea. No drug use  Tobacco use  Never smoker.  Family History Cerebrovascular Accident  Mother, Sister. Depression  Mother, Sister. Diabetes Mellitus  Mother. Heart Disease  Brother, Mother, Sister. Heart disease in female family member before age 19  Hypertension  Mother, Sister. Ischemic Bowel Disease  Sister. Kidney Disease  Sister. Prostate Cancer  Father. Respiratory Condition  Mother. Thyroid problems  Sister.  Pregnancy / Birth History  Age at menarche  51 years. Gravida  0 Irregular periods  Para  0  Other Problems  Anxiety Disorder  Back Pain  Bladder Problems  Cancer  Depression  Diabetes Mellitus  Gastroesophageal Reflux Disease  Hemorrhoids  Hypercholesterolemia  Kidney Stone  Sleep Apnea   Vitals Weight: 233 lb Height: 63in Body Surface Area: 2.06 m Body Mass Index: 41.27 kg/m  Temp.: 97.70F(Oral)  Pulse: 87 (Regular)  BP: 122/72 (Sitting, Left Arm, Standard)   Physical Exam  See vital signs recorded above  GENERAL APPEARANCE Development: normal Nutritional status: normal Gross deformities: none  SKIN Rash, lesions, ulcers: none Induration, erythema: none Nodules: none palpable  EYES Conjunctiva and lids: normal Pupils: equal and reactive Iris: normal bilaterally  EARS, NOSE, MOUTH, THROAT External ears: no lesion or deformity External nose: no lesion or deformity Hearing: grossly normal Lips: no lesion or  deformity Dentition: normal for age Oral mucosa: moist  NECK Symmetric: yes Trachea: midline Thyroid: no palpable nodules in the thyroid bed Well-healed anterior cervical incision with good cosmetic result  CHEST Respiratory effort: normal Retraction or  accessory muscle use: no Breath sounds: normal bilaterally Rales, rhonchi, wheeze: none  CARDIOVASCULAR Auscultation: regular rhythm, normal rate Murmurs: none Pulses: carotid and radial pulse 2+ palpable Lower extremity edema: none Lower extremity varicosities: none  ABDOMEN Distension: none Masses: none palpable Tenderness: none Hepatosplenomegaly: not present Hernia: not present Well-healed laparoscopic surgical incisions. No sign of herniation.  MUSCULOSKELETAL Station and gait: normal Digits and nails: no clubbing or cyanosis Muscle strength: grossly normal all extremities Range of motion: grossly normal all extremities Deformity: none  LYMPHATIC Cervical: none palpable Supraclavicular: none palpable  PSYCHIATRIC Oriented to person, place, and time: yes Mood and affect: normal for situation Judgment and insight: appropriate for situation    Assessment & Plan  CHOLELITHIASIS WITH CHRONIC CHOLECYSTITIS (K80.10)  Pt Education - Pamphlet Given - Laparoscopic Gallbladder Surgery: discussed with patient and provided information.  Patient presents with signs and symptoms of chronic cholecystitis and cholelithiasis. Ultrasound examination shows multiple gallstones. Patient is provided with written literature on gallbladder surgery to review at home.  I have recommended proceeding with laparoscopic cholecystectomy with intraoperative cholangiography. We discussed the risk and benefits of the procedure including the potential for conversion to open surgery. We discussed the hospital stay to be anticipated. We discussed the possibility of persistent symptoms following surgery due to other causes. Patient  understands and wishes to proceed with surgery in March 2019 at a time convenient for her schedule. We will make these arrangements.  The risks and benefits of the procedure have been discussed at length with the patient. The patient understands the proposed procedure, potential alternative treatments, and the course of recovery to be expected. All of the patient's questions have been answered at this time. The patient wishes to proceed with surgery.  Armandina Gemma, [place] [facility] Office: [phone]

## 2017-09-06 NOTE — Transfer of Care (Signed)
Immediate Anesthesia Transfer of Care Note  Patient: Rhonda Higgins  Procedure(s) Performed: LAPAROSCOPIC CHOLECYSTECTOMY WITH INTRAOPERATIVE CHOLANGIOGRAM (N/A Abdomen)  Patient Location: PACU  Anesthesia Type:General  Level of Consciousness: awake, alert  and oriented  Airway & Oxygen Therapy: Patient Spontanous Breathing and Patient connected to face mask oxygen  Post-op Assessment: Report given to RN and Post -op Vital signs reviewed and stable  Post vital signs: Reviewed and stable  Last Vitals:  Vitals Value Taken Time  BP 145/84 09/06/2017  9:18 AM  Temp    Pulse 111 09/06/2017  9:19 AM  Resp 17 09/06/2017  9:19 AM  SpO2 96 % 09/06/2017  9:19 AM  Vitals shown include unvalidated device data.  Last Pain:  Vitals:   09/06/17 0629  TempSrc:   PainSc: 0-No pain         Complications: No apparent anesthesia complications

## 2017-09-07 ENCOUNTER — Observation Stay (HOSPITAL_COMMUNITY): Payer: Medicaid Other | Admitting: Anesthesiology

## 2017-09-07 ENCOUNTER — Encounter (HOSPITAL_COMMUNITY)
Admission: RE | Disposition: A | Discharge status: Home / Self Care | Payer: Self-pay | Source: Ambulatory Visit | Attending: Surgery

## 2017-09-07 ENCOUNTER — Encounter (HOSPITAL_COMMUNITY): Payer: Self-pay | Admitting: Surgery

## 2017-09-07 ENCOUNTER — Observation Stay (HOSPITAL_COMMUNITY): Payer: Medicaid Other

## 2017-09-07 DIAGNOSIS — K317 Polyp of stomach and duodenum: Secondary | ICD-10-CM | POA: Diagnosis not present

## 2017-09-07 DIAGNOSIS — K8064 Calculus of gallbladder and bile duct with chronic cholecystitis without obstruction: Secondary | ICD-10-CM | POA: Diagnosis not present

## 2017-09-07 DIAGNOSIS — K219 Gastro-esophageal reflux disease without esophagitis: Secondary | ICD-10-CM | POA: Diagnosis not present

## 2017-09-07 DIAGNOSIS — K7460 Unspecified cirrhosis of liver: Secondary | ICD-10-CM | POA: Diagnosis not present

## 2017-09-07 HISTORY — PX: ERCP: SHX5425

## 2017-09-07 LAB — IRON AND TIBC
Iron: 47 ug/dL (ref 28–170)
Saturation Ratios: 13 % (ref 10.4–31.8)
TIBC: 349 ug/dL (ref 250–450)
UIBC: 302 ug/dL

## 2017-09-07 LAB — COMPREHENSIVE METABOLIC PANEL
ALT: 64 U/L — ABNORMAL HIGH (ref 14–54)
AST: 54 U/L — ABNORMAL HIGH (ref 15–41)
Albumin: 3.3 g/dL — ABNORMAL LOW (ref 3.5–5.0)
Alkaline Phosphatase: 70 U/L (ref 38–126)
Anion gap: 6 (ref 5–15)
BUN: 11 mg/dL (ref 6–20)
CO2: 26 mmol/L (ref 22–32)
Calcium: 8.3 mg/dL — ABNORMAL LOW (ref 8.9–10.3)
Chloride: 107 mmol/L (ref 101–111)
Creatinine, Ser: 0.75 mg/dL (ref 0.44–1.00)
GFR calc Af Amer: >60 mL/min (ref >60)
GFR calc non Af Amer: >60 mL/min (ref >60)
Glucose, Bld: 174 mg/dL — ABNORMAL HIGH (ref 65–99)
Potassium: 4.9 mmol/L (ref 3.5–5.1)
Sodium: 139 mmol/L (ref 135–145)
Total Bilirubin: 0.8 mg/dL (ref 0.3–1.2)
Total Protein: 6.2 g/dL — ABNORMAL LOW (ref 6.5–8.1)

## 2017-09-07 LAB — CBC
HCT: 37.4 % (ref 36.0–46.0)
Hemoglobin: 11.9 g/dL — ABNORMAL LOW (ref 12.0–15.0)
MCH: 27.4 pg (ref 26.0–34.0)
MCHC: 31.8 g/dL (ref 30.0–36.0)
MCV: 86 fL (ref 78.0–100.0)
Platelets: 119 10*3/uL — ABNORMAL LOW (ref 150–400)
RBC: 4.35 MIL/uL (ref 3.87–5.11)
RDW: 14.4 % (ref 11.5–15.5)
WBC: 10.3 10*3/uL (ref 4.0–10.5)

## 2017-09-07 LAB — PROTIME-INR
INR: 1.16
Prothrombin Time: 14.7 seconds (ref 11.4–15.2)

## 2017-09-07 LAB — GLUCOSE, CAPILLARY
Glucose-Capillary: 149 mg/dL — ABNORMAL HIGH (ref 65–99)
Glucose-Capillary: 136 mg/dL — ABNORMAL HIGH (ref 65–99)
Glucose-Capillary: 155 mg/dL — ABNORMAL HIGH (ref 65–99)

## 2017-09-07 LAB — FERRITIN: Ferritin: 60 ng/mL (ref 11–307)

## 2017-09-07 SURGERY — ERCP, WITH INTERVENTION IF INDICATED
Anesthesia: General

## 2017-09-07 MED ORDER — PROPOFOL 10 MG/ML IV BOLUS
INTRAVENOUS | Status: DC | PRN
  Administered 2017-09-07: 150 mg via INTRAVENOUS

## 2017-09-07 MED ORDER — TRAMADOL HCL 50 MG PO TABS: 50.0000 mg | ORAL_TABLET | Freq: Four times a day (QID) | ORAL | 0 refills | Status: DC | PRN

## 2017-09-07 MED ORDER — CIPROFLOXACIN IN D5W 400 MG/200ML IV SOLN
INTRAVENOUS | Status: AC
  Filled 2017-09-07: qty 200

## 2017-09-07 MED ORDER — DEXAMETHASONE SODIUM PHOSPHATE 10 MG/ML IJ SOLN
INTRAMUSCULAR | Status: DC | PRN
  Administered 2017-09-07: 10 mg via INTRAVENOUS

## 2017-09-07 MED ORDER — ONDANSETRON HCL 4 MG/2ML IJ SOLN
INTRAMUSCULAR | Status: DC | PRN
  Administered 2017-09-07: 4 mg via INTRAVENOUS

## 2017-09-07 MED ORDER — PROPOFOL 10 MG/ML IV BOLUS
INTRAVENOUS | Status: AC
  Filled 2017-09-07: qty 60

## 2017-09-07 MED ORDER — SUGAMMADEX SODIUM 500 MG/5ML IV SOLN
INTRAVENOUS | Status: DC | PRN
Start: 2017-09-07 — End: 2017-09-07
  Administered 2017-09-07: 422.8 mg via INTRAVENOUS

## 2017-09-07 MED ORDER — ROCURONIUM BROMIDE 10 MG/ML (PF) SYRINGE
PREFILLED_SYRINGE | INTRAVENOUS | Status: DC | PRN
  Administered 2017-09-07: 50 mg via INTRAVENOUS

## 2017-09-07 MED ORDER — LACTATED RINGERS IV SOLN
INTRAVENOUS | Status: DC | PRN
  Administered 2017-09-07: 09:00:00 via INTRAVENOUS

## 2017-09-07 MED ORDER — MIDAZOLAM HCL 5 MG/5ML IJ SOLN
INTRAMUSCULAR | Status: DC | PRN
  Administered 2017-09-07: 2 mg via INTRAVENOUS

## 2017-09-07 MED ORDER — GLUCAGON HCL RDNA (DIAGNOSTIC) 1 MG IJ SOLR
INTRAMUSCULAR | Status: AC
  Filled 2017-09-07: qty 1

## 2017-09-07 MED ORDER — FENTANYL CITRATE (PF) 100 MCG/2ML IJ SOLN
INTRAMUSCULAR | Status: DC | PRN
  Administered 2017-09-07: 100 ug via INTRAVENOUS

## 2017-09-07 MED ORDER — FENTANYL CITRATE (PF) 100 MCG/2ML IJ SOLN
INTRAMUSCULAR | Status: AC
  Filled 2017-09-07: qty 2

## 2017-09-07 MED ORDER — INDOMETHACIN 50 MG RE SUPP
RECTAL | Status: AC
  Filled 2017-09-07: qty 2

## 2017-09-07 MED ORDER — LIDOCAINE 2% (20 MG/ML) 5 ML SYRINGE
INTRAMUSCULAR | Status: DC | PRN
  Administered 2017-09-07: 50 mg via INTRAVENOUS

## 2017-09-07 MED ORDER — MIDAZOLAM HCL 2 MG/2ML IJ SOLN
INTRAMUSCULAR | Status: AC
  Filled 2017-09-07: qty 2

## 2017-09-07 MED ORDER — CIPROFLOXACIN IN D5W 400 MG/200ML IV SOLN
INTRAVENOUS | Status: DC | PRN
  Administered 2017-09-07: 400 mg via INTRAVENOUS

## 2017-09-07 MED ORDER — SODIUM CHLORIDE 0.9 % IV SOLN
INTRAVENOUS | Status: DC | PRN
  Administered 2017-09-07: 30 mL

## 2017-09-07 MED ORDER — GLYCOPYRROLATE 0.2 MG/ML IV SOSY
PREFILLED_SYRINGE | INTRAVENOUS | Status: DC | PRN
  Administered 2017-09-07: .2 mg via INTRAVENOUS

## 2017-09-07 NOTE — Discharge Summary (Signed)
Physician Discharge Summary [facility]  Patient ID: Rhonda Higgins MRN: [id number] DOB/AGE: 1970-11-21 47 y.o.  Admit date: 09/06/2017 Discharge date: 09/07/2017  Admission Diagnoses:  Chronic cholecystitis, cholelithiasis  Discharge Diagnoses:  Principal Problem:   Cholelithiasis with chronic cholecystitis   Discharged Condition: good  Hospital Course: Patient was admitted for observation following gallbladder surgery.  Post op course was complicated by a diagnosis of cirrhosis at surgery and findings of distal CBD stones on IOC.  Patient was seen in consultation by [facility] and underwent successful ERCP this AM.  Pain was well controlled.  Tolerated diet.  Patient was prepared for discharge home on POD#2.  Consults: GI  Treatments: surgery: lap chole with IOC, ERCP with stone extraction  Discharge Exam: Blood pressure 131/82, pulse (!) 101, temperature 97.9 F (36.6 C), temperature source Oral, resp. rate 18, height 5\' 3"  (1.6 m), weight 105.7 kg (233 lb), SpO2 97 %. HEENT - clear Neck - soft Chest - clear bilaterally Cor - RRR Abd - soft without distension; dressings dry and intact  Disposition: Home  Discharge Instructions    Diet - low sodium heart healthy   Complete by:  As directed    Discharge instructions   Complete by:  As directed    [facility]  LAPAROSCOPIC SURGERY:  POST-OP INSTRUCTIONS  Always review your discharge instruction sheet given to you by the facility where your surgery was performed.  A prescription for pain medication may be given to you upon discharge.  Take your pain medication as prescribed.  If narcotic pain medicine is not needed, then you may take acetaminophen (Tylenol) or ibuprofen (Advil) as needed.  Take your usually prescribed medications unless otherwise directed.  If you need a refill on your pain medication, please contact your pharmacy.  They will contact our office to request  authorization. Prescriptions will not be filled after 5 P.M. or on weekends.  You should follow a light diet the first few days after arrival home, such as soup and crackers or toast.  Be sure to include plenty of fluids daily.  Most patients will experience some swelling and bruising in the area of the incisions.  Ice packs will help.  Swelling and bruising can take several days to resolve.   It is common to experience some constipation after surgery.  Increasing fluid intake and taking a stool softener (such as Colace) will usually help or prevent this problem from occurring.  A mild laxative (Milk of Magnesia or Miralax) should be taken according to package instructions if there has been no bowel movement after 48 hours.  You will have steri-strips and a gauze dressing over your incisions.  You may remove the gauze bandage on the second day after surgery, and you may shower at that time.  Leave your steri-strips (small skin tapes) in place directly over the incision.  These strips should remain on the skin for 5-7 days and then be removed.  You may get them wet in the shower and pat them dry.  Any sutures or staples will be removed at the office during your follow-up visit.  ACTIVITIES:  You may resume regular (light) daily activities beginning the next day - such as daily self-care, walking, climbing stairs - gradually increasing activities as tolerated.  You may have sexual intercourse when it is comfortable.  Refrain from any heavy lifting or straining until approved by your doctor.  You may drive when you are no longer taking prescription  pain medication, you can comfortably wear a seatbelt, and you can safely maneuver your car and apply brakes.  You should see your doctor in the office for a follow-up appointment approximately 2-3 weeks after your surgery.  Make sure that you call for this appointment within a day or two after you arrive home to insure a convenient appointment time.  WHEN  TO CALL YOUR DOCTOR: Fever over 101.0 Inability to urinate Continued bleeding from incision Increased pain, redness, or drainage from the incision Increasing abdominal pain  The clinic staff is available to answer your questions during regular business hours.  Please don't hesitate to call and ask to speak to one of the nurses for clinical concerns.  If you have a medical emergency, go to the nearest emergency room or call 911.  A surgeon from [facility] is always on call for the hospital.  Earnstine Regal, MD, [facility] Office: [place] Free:  [place] [phone]  Website: [email or web address]   Increase activity slowly   Complete by:  As directed    Remove dressing in 24 hours   Complete by:  As directed      Allergies as of 09/07/2017      Reactions   Hydrocodone Shortness Of Breath   Codeine Itching   Adhesive [tape] Rash   Paper tape is ok      Medication List    TAKE these medications   aspirin EC 81 MG tablet Take 162 mg by mouth at bedtime.   buPROPion 150 MG 24 hr tablet Commonly known as:  WELLBUTRIN XL Take 150 mg by mouth at bedtime.   cetirizine 10 MG tablet Commonly known as:  ZYRTEC Take 10 mg by mouth at bedtime.   gabapentin 400 MG capsule Commonly known as:  NEURONTIN Take 400-1,200 mg by mouth 3 (three) times daily. Pt takes one capsule in the morning, one in the afternoon, and three at bedtime.   glimepiride 4 MG tablet Commonly known as:  AMARYL Take 4 mg by mouth 2 (two) times daily.   ibuprofen 800 MG tablet Commonly known as:  ADVIL,MOTRIN Take 800 mg by mouth every 8 (eight) hours as needed for moderate pain.   lamoTRIgine 200 MG tablet Commonly known as:  LAMICTAL Take 200 mg by mouth at bedtime.   LANTUS SOLOSTAR 100 UNIT/ML Solostar Pen Generic drug:  Insulin Glargine Inject 20 Units into the skin at bedtime.   levothyroxine 137 MCG tablet Commonly known  as:  SYNTHROID, LEVOTHROID Take 137 mcg by mouth daily before breakfast.   lisinopril 5 MG tablet Commonly known as:  PRINIVIL,ZESTRIL Take 5 mg by mouth at bedtime.   multivitamin with minerals tablet Take 1 tablet by mouth daily.   pantoprazole 40 MG tablet Commonly known as:  PROTONIX Take 40 mg by mouth every morning.   risperiDONE 2 MG tablet Commonly known as:  RISPERDAL Take 2 mg by mouth at bedtime.   simvastatin 40 MG tablet Commonly known as:  ZOCOR Take 40 mg by mouth at bedtime.   sitaGLIPtin-metformin 50-1000 MG tablet Commonly known as:  JANUMET Take 1 tablet by mouth 2 (two) times daily with a meal.   traMADol 50 MG tablet Commonly known as:  ULTRAM Take 1-2 tablets (50-100 mg total) by mouth every 6 (six) hours as needed for moderate pain (mild pain, refractory to tylneol).   vitamin C 1000 MG tablet Take 1,000 mg by mouth daily.  Follow-up Information    Armandina Gemma, MD. Schedule an appointment as soon as possible for a visit in 3 week(s).   Specialty:  General Surgery Contact information: [address] [place] [address] [phone]        Wilford Corner, MD. Schedule an appointment as soon as possible for a visit in 2 week(s).   Specialty:  Gastroenterology Contact information: [address] [place] [address] [phone]           Earnstine Regal, MD, [facility] Office: [phone]   Signed: Earnstine Regal 09/07/2017, 1:37 PM

## 2017-09-07 NOTE — Progress Notes (Signed)
Pt alert and oriented, tolerating diet. D/C instructions and prescription was given.  All questions were answered and pt was d/cd home.

## 2017-09-07 NOTE — Anesthesia Procedure Notes (Signed)
Procedure Name: Intubation Date/Time: 09/07/2017 9:40 AM Performed by: Lavina Hamman, CRNA Pre-anesthesia Checklist: Patient identified, Emergency Drugs available, Suction available, Patient being monitored and Timeout performed Patient Re-evaluated:Patient Re-evaluated prior to induction Oxygen Delivery Method: Circle system utilized Preoxygenation: Pre-oxygenation with 100% oxygen Induction Type: IV induction Ventilation: Mask ventilation without difficulty Laryngoscope Size: Mac and 4 Grade View: Grade II Tube type: Oral Tube size: 7.0 mm Number of attempts: 1 Airway Equipment and Method: Stylet Placement Confirmation: ETT inserted through vocal cords under direct vision,  positive ETCO2,  CO2 detector and breath sounds checked- equal and bilateral Secured at: 21 cm Tube secured with: Tape Dental Injury: Teeth and Oropharynx as per pre-operative assessment

## 2017-09-07 NOTE — Anesthesia Postprocedure Evaluation (Signed)
Anesthesia Post Note  Patient: Rhonda Higgins  Procedure(s) Performed: ENDOSCOPIC RETROGRADE CHOLANGIOPANCREATOGRAPHY (ERCP) (N/A )     Patient location during evaluation: PACU Anesthesia Type: General Level of consciousness: awake and alert Pain management: pain level controlled Vital Signs Assessment: post-procedure vital signs reviewed and stable Respiratory status: spontaneous breathing, nonlabored ventilation and respiratory function stable Cardiovascular status: blood pressure returned to baseline and stable Postop Assessment: no apparent nausea or vomiting Anesthetic complications: no    Last Vitals:  Vitals:   09/07/17 1050 09/07/17 1108  BP: 114/70 131/82  Pulse: 99 (!) 101  Resp: 15 18  Temp:    SpO2: 95% 97%    Last Pain:  Vitals:   09/07/17 1108  TempSrc: Oral  PainSc:                  Catalina Gravel

## 2017-09-07 NOTE — Brief Op Note (Signed)
09/06/2017 - 09/07/2017  10:14 AM  PATIENT:  Kianni L Applegarth  47 y.o. female  PRE-OPERATIVE DIAGNOSIS:  CBD stone  POST-OPERATIVE DIAGNOSIS:  sphinterotomy with stone extraction  PROCEDURE:  Procedure(s): ENDOSCOPIC RETROGRADE CHOLANGIOPANCREATOGRAPHY (ERCP) (N/A)  SURGEON:  Surgeon(s) and Role:    [name], MD - Primary  PHYSICIAN ASSISTANT:   ASSISTANTS: Autumn, Genice Rouge, Tech  ANESTHESIA:   MAC  EBL:  None  BLOOD ADMINISTERED:none  DRAINS: none   LOCAL MEDICATIONS USED:  NONE  SPECIMEN:  No Specimen  DISPOSITION OF SPECIMEN:  N/A  COUNTS:  YES  TOURNIQUET:  * No tourniquets in log *  DICTATION: .Dragon Dictation  PLAN OF CARE: Admit to inpatient   PATIENT DISPOSITION:  PACU - hemodynamically stable.   Delay start of Pharmacological VTE agent (>24hrs) due to surgical blood loss or risk of bleeding: no

## 2017-09-07 NOTE — Anesthesia Preprocedure Evaluation (Addendum)
Anesthesia Evaluation  Patient identified by MRN, date of birth, ID band Patient awake    Reviewed: Allergy & Precautions, NPO status , Patient's Chart, lab work & pertinent test results  History of Anesthesia Complications (+) PONV and history of anesthetic complications  Airway Mallampati: III  TM Distance: <3 FB Neck ROM: Full    Dental  (+) Teeth Intact, Dental Advisory Given, Missing, Partial Upper, Partial Lower, Chipped,    Pulmonary sleep apnea ,    Pulmonary exam normal breath sounds clear to auscultation       Cardiovascular Exercise Tolerance: Good negative cardio ROS Normal cardiovascular exam Rhythm:Regular Rate:Normal     Neuro/Psych PSYCHIATRIC DISORDERS Depression negative neurological ROS     GI/Hepatic GERD  Medicated,(+) Cirrhosis       , CBD stone Polyphagia   Endo/Other  diabetes, Type 2Hypothyroidism Morbid obesity  Renal/GU negative Renal ROS     Musculoskeletal negative musculoskeletal ROS (+)   Abdominal   Peds  Hematology  (+) Blood dyscrasia (Thrombocytopenia), anemia ,   Anesthesia Other Findings Day of surgery medications reviewed with the patient.  Reproductive/Obstetrics                           Anesthesia Physical Anesthesia Plan  ASA: III  Anesthesia Plan: General   Post-op Pain Management:    Induction: Intravenous  PONV Risk Score and Plan: 4 or greater and Midazolam, Scopolamine patch - Pre-op, Dexamethasone and Ondansetron  Airway Management Planned: Oral ETT  Additional Equipment:   Intra-op Plan:   Post-operative Plan: Extubation in OR  Informed Consent: I have reviewed the patients History and Physical, chart, labs and discussed the procedure including the risks, benefits and alternatives for the proposed anesthesia with the patient or authorized representative who has indicated his/her understanding and acceptance.   Dental  advisory given  Plan Discussed with: CRNA  Anesthesia Plan Comments: (Risks/benefits of general anesthesia discussed with patient including risk of damage to teeth, lips, gum, and tongue, nausea/vomiting, allergic reactions to medications, and the possibility of heart attack, stroke and death.  All patient questions answered.  Patient wishes to proceed.)        Anesthesia Quick Evaluation

## 2017-09-07 NOTE — Op Note (Signed)
ERCP was performed for therapy of bile duct stone noted on intraoperative cholangiogram.  Findings: Filling defect consistent with possible stone noted on cholangiogram in distal CBD.  Sphincterotomy performed and balloon extraction performed with 12 mm balloon. 2 CBD stones removed. No stones remained on cholangiogram post-balloon sweeps. No post-sphincterotomy bleeding noted.  A few benign-appearing gastric polyps noted, retroflexion was unremarkable, moderate amount of bilious fluid noted in gastric body which was suctioned by the scope.   Recommendations: Regular diet. Follow-up of workup for cirrhosis can be done as an outpatient. Patient to follow up with Dr. Pamalee Leyden GI in 2 weeks post discharge.  [name], M.D.

## 2017-09-07 NOTE — Transfer of Care (Signed)
Immediate Anesthesia Transfer of Care Note  Patient: Rhonda Higgins  Procedure(s) Performed: ENDOSCOPIC RETROGRADE CHOLANGIOPANCREATOGRAPHY (ERCP) (N/A )  Patient Location: PACU  Anesthesia Type:General  Level of Consciousness: awake, alert  and oriented  Airway & Oxygen Therapy: Patient Spontanous Breathing and Patient connected to face mask oxygen  Post-op Assessment: Report given to RN  Post vital signs: Reviewed and stable  Last Vitals:  Vitals Value Taken Time  BP 139/57 09/07/2017 10:26 AM  Temp    Pulse 127 09/07/2017 10:26 AM  Resp 6 09/07/2017 10:26 AM  SpO2 97 % 09/07/2017 10:26 AM  Vitals shown include unvalidated device data.  Last Pain:  Vitals:   09/07/17 0844  TempSrc: Oral  PainSc: 0-No pain      Patients Stated Pain Goal: 2 (23/53/61 4431)  Complications: No apparent anesthesia complications

## 2017-09-07 NOTE — Interval H&P Note (Signed)
History and Physical Interval Note: 46/female with newly diagnosed cirrhosis during cholecystectomy and abnormal IOC for an ERCP.  09/07/2017 9:37 AM  Rhonda Higgins  has presented today for ERCP with the diagnosis of CBD stone  The various methods of treatment have been discussed with the patient and family. After consideration of risks, benefits and other options for treatment, the patient has consented to  Procedure(s): ENDOSCOPIC RETROGRADE CHOLANGIOPANCREATOGRAPHY (ERCP) (N/A) as a surgical intervention .  The patient's history has been reviewed, patient examined, no change in status, stable for surgery.  I have reviewed the patient's chart and labs.  Questions were answered to the patient's satisfaction.     [name]

## 2017-09-07 NOTE — Op Note (Signed)
[facility] Patient Name: Rhonda Higgins Procedure Date: 09/07/2017 MRN: [id number] Attending MD: [name] , MD Date of Birth: [birth year] CSN: [id number] Age: 47 Admit Type: Inpatient Procedure:                ERCP Indications:              For therapy of bile duct stone(s) Providers:                [name], MD, Carolynn Comment RN, RN, Alan Mulder, Technician, Arnoldo Hooker, CRNA Referring MD:              Medicines:                General Anesthesia Complications:            No immediate complications. Estimated Blood Loss:     Estimated blood loss: none. Procedure:                Pre-Anesthesia Assessment:                           - Prior to the procedure, a History and Physical                            was performed, and patient medications and                            allergies were reviewed. The patient's tolerance of                            previous anesthesia was also reviewed. The risks                            and benefits of the procedure and the sedation                            options and risks were discussed with the patient.                            All questions were answered, and informed consent                            was obtained. Prior Anticoagulants: The patient has                            taken no previous anticoagulant or antiplatelet                            agents. ASA Grade Assessment: III - A patient with                            severe systemic disease. After reviewing the risks  and benefits, the patient was deemed in                            satisfactory condition to undergo the procedure.                           After obtaining informed consent, the scope was                            passed under direct vision. Throughout the                            procedure, the patient's blood pressure, pulse, and                            oxygen saturations  were monitored continuously. The                            [id number] ([id number]) scope was introduced through                            the mouth, and used to inject contrast into and                            used to inject contrast into the bile duct. The                            ERCP was accomplished without difficulty. The                            patient tolerated the procedure well. Scope In: Scope Out: Findings:      The scout film was normal. The esophagus was successfully intubated       under direct vision. The scope was advanced to a normal major papilla in       the descending duodenum without detailed examination of the pharynx,       larynx and associated structures, and upper GI tract. The upper GI tract       was grossly normal. The bile duct was deeply cannulated with the       sphincterotome. Contrast was injected. I personally interpreted the bile       duct images. There was brisk flow of contrast through the ducts. Image       quality was excellent. Contrast extended to the entire biliary tree. The       lower third of the main bile duct contained filling defect(s) thought to       be a stone. A straight Roadrunner wire was passed into the biliary tree.       A 12 mm biliary sphincterotomy was made with a braided sphincterotome       using ERBE electrocautery. There was no post-sphincterotomy bleeding.       The biliary tree was swept with a 12 mm balloon starting at the       bifurcation. Two stones were removed. No stones remained. The pancreatic       duct was not cannulated or injected during the  entire procedure.      A few gastric polyps were noted in the stomach, moderate amount of       bilious fluid was noted which was suctioned through the scope. Impression:               - A filling defect consistent with a stone was seen                            on the cholangiogram.                           - Choledocholithiasis was found. Complete removal                             was accomplished by biliary sphincterotomy and                            balloon extraction.                           - A biliary sphincterotomy was performed.                           - The biliary tree was swept. Moderate Sedation:      Patient did not receive moderate sedation for this procedure, but       instead received monitored anesthesia care. Recommendation:           - Resume regular diet.                           - Workup for cirrhosis can be followed up as an                            outpatient.                           Okay to discharge from GI standpoint. Procedure Code(s):        --- Professional ---                           [phone], Endoscopic retrograde                            cholangiopancreatography (ERCP); with removal of                            calculi/debris from biliary/pancreatic duct(s)                           43262, Endoscopic retrograde                            cholangiopancreatography (ERCP); with                            sphincterotomy/papillotomy  74328, Endoscopic catheterization of the biliary                            ductal system, radiological supervision and                            interpretation Diagnosis Code(s):        --- Professional ---                           K80.50, Calculus of bile duct without cholangitis                            or cholecystitis without obstruction                           R93.2, Abnormal findings on diagnostic imaging of                            liver and biliary tract CPT copyright 2016 [employer]. All rights reserved. The codes documented in this report are preliminary and upon coder review may  be revised to meet current compliance requirements. [name], MD 09/07/2017 10:14:37 AM This report has been signed electronically. Number of Addenda: 0

## 2017-09-08 LAB — HEPATITIS PANEL, ACUTE
HCV Ab: <0.1 s/co ratio (ref 0.0–0.9)
Hep A IgM: NEGATIVE
Hep B C IgM: NEGATIVE
Hepatitis B Surface Ag: NEGATIVE

## 2017-09-08 LAB — ALPHA-1-ANTITRYPSIN: A-1 Antitrypsin, Ser: 191 mg/dL (ref 90–200)

## 2017-09-08 LAB — HEPATITIS B SURFACE ANTIGEN: Hepatitis B Surface Ag: NEGATIVE

## 2017-09-08 LAB — HEPATITIS C ANTIBODY: HCV Ab: <0.1 s/co ratio (ref 0.0–0.9)

## 2017-09-08 LAB — CERULOPLASMIN: Ceruloplasmin: 21.9 mg/dL (ref 19.0–39.0)

## 2017-09-09 LAB — MITOCHONDRIAL ANTIBODIES: Mitochondrial M2 Ab, IgG: <20 Units (ref 0.0–20.0)

## 2017-09-09 LAB — ANTI-SMOOTH MUSCLE ANTIBODY, IGG: F-Actin IgG: 3 Units (ref 0–19)

## 2017-09-09 LAB — ANTINUCLEAR ANTIBODIES, IFA: ANA Ab, IFA: NEGATIVE

## 2017-09-10 LAB — PROTEIN ELECTROPHORESIS, SERUM
A/G Ratio: 1.2 (ref 0.7–1.7)
Albumin ELP: 3.2 g/dL (ref 2.9–4.4)
Alpha-1-Globulin: 0.3 g/dL (ref 0.0–0.4)
Alpha-2-Globulin: 0.6 g/dL (ref 0.4–1.0)
Beta Globulin: 1 g/dL (ref 0.7–1.3)
Gamma Globulin: 0.7 g/dL (ref 0.4–1.8)
Globulin, Total: 2.6 g/dL (ref 2.2–3.9)
Total Protein ELP: 5.8 g/dL — ABNORMAL LOW (ref 6.0–8.5)

## 2017-09-11 ENCOUNTER — Institutional Professional Consult (permissible substitution): Payer: Medicaid Other | Admitting: Neurology

## 2017-10-23 ENCOUNTER — Ambulatory Visit (INDEPENDENT_AMBULATORY_CARE_PROVIDER_SITE_OTHER): Payer: Medicaid Other | Admitting: Sports Medicine

## 2017-10-23 ENCOUNTER — Encounter: Payer: Self-pay | Admitting: Sports Medicine

## 2017-10-23 DIAGNOSIS — M79675 Pain in left toe(s): Secondary | ICD-10-CM | POA: Diagnosis not present

## 2017-10-23 DIAGNOSIS — M79674 Pain in right toe(s): Secondary | ICD-10-CM | POA: Diagnosis not present

## 2017-10-23 DIAGNOSIS — Z9889 Other specified postprocedural states: Secondary | ICD-10-CM

## 2017-10-23 DIAGNOSIS — E119 Type 2 diabetes mellitus without complications: Secondary | ICD-10-CM

## 2017-10-23 DIAGNOSIS — B351 Tinea unguium: Secondary | ICD-10-CM | POA: Diagnosis not present

## 2017-10-23 NOTE — Progress Notes (Signed)
Subjective: Rhonda Higgins is a 47 y.o. female patient with history of diabetes who presents to office today complaining of long,mildly painful nails  while ambulating in shoes; unable to trim. Patient states that the glucose reading this morning was not checked but has been good. Patient denies any new changes in medication or new problems; wants left great toe nail checked where ingrown was removed gets ocassional burning pain to area. Patient denies other issues.  Patient Active Problem List   Diagnosis Date Noted  . Cholelithiasis with chronic cholecystitis 09/06/2017  . Multinodular goiter (nontoxic) 11/17/2015  . Chest pain 07/20/2014  . Diabetes mellitus without complication ([place]) 16/03/9603  . GERD (gastroesophageal reflux disease) 07/19/2014  . Personality disorder ([place])   . Uterine cancer ([place]) 06/22/2011  . DM 02/09/2010  . HLD (hyperlipidemia) 02/09/2010  . Obstructive sleep apnea 02/09/2010  . Allergic rhinitis 02/09/2010  . History of uterine cancer 02/09/2010   Current Outpatient Medications on File Prior to Visit  Medication Sig Dispense Refill  . Ascorbic Acid (VITAMIN C) 1000 MG tablet Take 1,000 mg by mouth daily.    [name] aspirin EC 81 MG tablet Take 162 mg by mouth at bedtime.    [name] buPROPion (WELLBUTRIN XL) 150 MG 24 hr tablet Take 150 mg by mouth at bedtime.     . cetirizine (ZYRTEC) 10 MG tablet Take 10 mg by mouth at bedtime.     . gabapentin (NEURONTIN) 400 MG capsule Take 400-1,200 mg by mouth 3 (three) times daily. Pt takes one capsule in the morning, one in the afternoon, and three at bedtime.    [name] glimepiride (AMARYL) 4 MG tablet Take 4 mg by mouth 2 (two) times daily.   5  . ibuprofen (ADVIL,MOTRIN) 800 MG tablet Take 800 mg by mouth every 8 (eight) hours as needed for moderate pain.    [name] lamoTRIgine (LAMICTAL) 200 MG tablet Take 200 mg by mouth at bedtime.   2  . LANTUS SOLOSTAR 100 UNIT/ML Solostar Pen Inject 20 Units into the skin at bedtime.  0  .  levothyroxine (SYNTHROID, LEVOTHROID) 137 MCG tablet Take 137 mcg by mouth daily before breakfast.     . lisinopril (PRINIVIL,ZESTRIL) 5 MG tablet Take 5 mg by mouth at bedtime.     . Multiple Vitamins-Minerals (MULTIVITAMIN WITH MINERALS) tablet Take 1 tablet by mouth daily.    . pantoprazole (PROTONIX) 40 MG tablet Take 40 mg by mouth every morning.   0  . risperiDONE (RISPERDAL) 2 MG tablet Take 2 mg by mouth at bedtime.  2  . simvastatin (ZOCOR) 40 MG tablet Take 40 mg by mouth at bedtime.     . sitaGLIPtin-metformin (JANUMET) 50-1000 MG tablet Take 1 tablet by mouth 2 (two) times daily with a meal.    . traMADol (ULTRAM) 50 MG tablet Take 1-2 tablets (50-100 mg total) by mouth every 6 (six) hours as needed for moderate pain (mild pain, refractory to tylneol). 15 tablet 0   No current facility-administered medications on file prior to visit.    Allergies  Allergen Reactions  . Hydrocodone Shortness Of Breath  . Codeine Itching  . Adhesive [Tape] Rash    Paper tape is ok    Recent Results (from the past 2160 hour(s))  Glucose, capillary     Status: Abnormal   Collection Time: 08/30/17  8:13 AM  Result Value Ref Range   Glucose-Capillary 163 (H) 65 - 99 mg/dL  CBC     Status: Abnormal  Collection Time: 08/30/17  8:53 AM  Result Value Ref Range   WBC 5.0 4.0 - 10.5 K/uL   RBC 4.84 3.87 - 5.11 MIL/uL   Hemoglobin 13.3 12.0 - 15.0 g/dL   HCT 41.5 36.0 - 46.0 %   MCV 85.7 78.0 - 100.0 fL   MCH 27.5 26.0 - 34.0 pg   MCHC 32.0 30.0 - 36.0 g/dL   RDW 14.3 11.5 - 15.5 %   Platelets 105 (L) 150 - 400 K/uL    Comment: REPEATED TO VERIFY SPECIMEN CHECKED FOR CLOTS PLATELET COUNT CONFIRMED BY SMEAR Performed at [facility] [address] [place], [place] [address]   Comprehensive metabolic panel     Status: Abnormal   Collection Time: 08/30/17  8:53 AM  Result Value Ref Range   Sodium 141 135 - 145 mmol/L   Potassium 4.1 3.5 - 5.1 mmol/L   Chloride 105 101 -  111 mmol/L   CO2 25 22 - 32 mmol/L   Glucose, Bld 151 (H) 65 - 99 mg/dL   BUN 13 6 - 20 mg/dL   Creatinine, Ser 0.75 0.44 - 1.00 mg/dL   Calcium 8.9 8.9 - 10.3 mg/dL   Total Protein 6.7 6.5 - 8.1 g/dL   Albumin 3.7 3.5 - 5.0 g/dL   AST 78 (H) 15 - 41 U/L   ALT 65 (H) 14 - 54 U/L   Alkaline Phosphatase 89 38 - 126 U/L   Total Bilirubin 1.0 0.3 - 1.2 mg/dL   GFR calc non Af Amer >60 >60 mL/min   GFR calc Af Amer >60 >60 mL/min    Comment: (NOTE) The eGFR has been calculated using the CKD EPI equation. This calculation has not been validated in all clinical situations. eGFR's persistently <60 mL/min signify possible Chronic Kidney Disease.    Anion gap 11 5 - 15    Comment: Performed at [facility], [place] [address] [place], [place] [address]  Glucose, capillary     Status: Abnormal   Collection Time: 09/06/17  6:05 AM  Result Value Ref Range   Glucose-Capillary 161 (H) 65 - 99 mg/dL  Glucose, capillary     Status: Abnormal   Collection Time: 09/06/17  9:24 AM  Result Value Ref Range   Glucose-Capillary 189 (H) 65 - 99 mg/dL  Glucose, capillary     Status: Abnormal   Collection Time: 09/06/17  3:50 PM  Result Value Ref Range   Glucose-Capillary 303 (H) 65 - 99 mg/dL  Glucose, capillary     Status: Abnormal   Collection Time: 09/06/17  8:54 PM  Result Value Ref Range   Glucose-Capillary 323 (H) 65 - 99 mg/dL  CBC     Status: Abnormal   Collection Time: 09/07/17  4:47 AM  Result Value Ref Range   WBC 10.3 4.0 - 10.5 K/uL   RBC 4.35 3.87 - 5.11 MIL/uL   Hemoglobin 11.9 (L) 12.0 - 15.0 g/dL   HCT 37.4 36.0 - 46.0 %   MCV 86.0 78.0 - 100.0 fL   MCH 27.4 26.0 - 34.0 pg   MCHC 31.8 30.0 - 36.0 g/dL   RDW 14.4 11.5 - 15.5 %   Platelets 119 (L) 150 - 400 K/uL    Comment: REPEATED TO VERIFY CONSISTENT WITH PREVIOUS RESULT Performed at [facility], [place] [address] [place], [place] [address]   Protime-INR     Status: None   Collection  Time: 09/07/17  4:47 AM  Result  Value Ref Range   Prothrombin Time 14.7 11.4 - 15.2 seconds   INR 1.16     Comment: Performed at [facility], [place] [address], [place], [place] [address]  Comprehensive metabolic panel     Status: Abnormal   Collection Time: 09/07/17  4:47 AM  Result Value Ref Range   Sodium 139 135 - 145 mmol/L   Potassium 4.9 3.5 - 5.1 mmol/L   Chloride 107 101 - 111 mmol/L   CO2 26 22 - 32 mmol/L   Glucose, Bld 174 (H) 65 - 99 mg/dL   BUN 11 6 - 20 mg/dL   Creatinine, Ser 0.75 0.44 - 1.00 mg/dL   Calcium 8.3 (L) 8.9 - 10.3 mg/dL   Total Protein 6.2 (L) 6.5 - 8.1 g/dL   Albumin 3.3 (L) 3.5 - 5.0 g/dL   AST 54 (H) 15 - 41 U/L   ALT 64 (H) 14 - 54 U/L   Alkaline Phosphatase 70 38 - 126 U/L   Total Bilirubin 0.8 0.3 - 1.2 mg/dL   GFR calc non Af Amer >60 >60 mL/min   GFR calc Af Amer >60 >60 mL/min    Comment: (NOTE) The eGFR has been calculated using the CKD EPI equation. This calculation has not been validated in all clinical situations. eGFR's persistently <60 mL/min signify possible Chronic Kidney Disease.    Anion gap 6 5 - 15    Comment: Performed at [facility], [place] [address] [place], [place] [address]  Hepatitis panel, acute     Status: None   Collection Time: 09/07/17  4:47 AM  Result Value Ref Range   Hepatitis B Surface Ag Negative Negative   HCV Ab <0.1 0.0 - 0.9 s/co ratio    Comment: (NOTE)                                  Negative:     < 0.8                             Indeterminate: 0.8 - 0.9                                  Positive:     > 0.9 The CDC recommends that a positive HCV antibody result be followed up with a HCV Nucleic Acid Amplification test (680321). Performed At: [facility] [place], [place] [id number] Rush Farmer MD [id number]    Hep A IgM Negative Negative   Hep B C IgM Negative Negative    Comment: Performed at [facility], [place] [address], [place], [place] [address]  Iron and TIBC     Status: None   Collection Time: 09/07/17  4:47 AM  Result Value Ref Range   Iron 47 28 - 170 ug/dL   TIBC 349 250 - 450 ug/dL   Saturation Ratios 13 10.4 - 31.8 %   UIBC 302 ug/dL    Comment: Performed at [facility], [place] [address], [place], [place] [address]  Ferritin     Status: None   Collection Time: 09/07/17  4:47 AM  Result Value Ref Range   Ferritin 60 11 - 307 ng/mL    Comment: Performed at [facility], [place],  [place] [address]  Mitochondrial antibodies     Status: None   Collection Time: 09/07/17  4:47 AM  Result Value Ref Range   Mitochondrial M2 Ab, IgG <20.0 0.0 - 20.0 Units    Comment: (NOTE)                                Negative    0.0 - 20.0                                Equivocal  20.1 - 24.9                                Positive         >24.9 Mitochondrial (M2) Antibodies are found in 90-96% of patients with primary biliary cirrhosis. Performed At: [facility] [place], [place] [id number] Rush Farmer MD [id number] Performed at [facility], [place] [address], [place], [place] [address]   Anti-smooth muscle antibody, IgG     Status: None   Collection Time: 09/07/17  4:47 AM  Result Value Ref Range   F-Actin IgG 3 0 - 19 Units    Comment: (NOTE)                 Negative                     0 - 19                 Weak positive               20 - 30                 Moderate to strong positive     >30 Actin Antibodies are found in 52-85% of patients with autoimmune hepatitis or chronic active hepatitis and in 22% of patients with primary biliary cirrhosis. Performed At: [facility] [place], [place] [id number] Rush Farmer MD OX:[id number] Performed at [facility], [place] [address] [place], [place] [address]   ANA, IFA (with reflex)     Status: None   Collection Time:  09/07/17  4:47 AM  Result Value Ref Range   ANA Ab, IFA Negative     Comment: (NOTE)                                     Negative   <1:80                                     Borderline  1:80                                     Positive   >1:80 Performed At: [facility] [place], [place] [id number] Rush Farmer MD [id number] Performed at [facility], [place] [address], [place], [place] [address]   Protein electrophoresis, serum     Status: Abnormal   Collection Time: 09/07/17  4:47 AM  Result Value Ref Range   Total Protein  ELP 5.8 (L) 6.0 - 8.5 g/dL   Albumin ELP 3.2 2.9 - 4.4 g/dL   Alpha-1-Globulin 0.3 0.0 - 0.4 g/dL   Alpha-2-Globulin 0.6 0.4 - 1.0 g/dL   Beta Globulin 1.0 0.7 - 1.3 g/dL   Gamma Globulin 0.7 0.4 - 1.8 g/dL   M-Spike, % Not Observed Not Observed g/dL   SPE Interp. Comment     Comment: (NOTE) The SPE pattern appears essentially unremarkable. Evidence of monoclonal protein is not apparent. Performed At: [facility] [place], [place] [id number] Rush Farmer MD EH:[id number]    Comment Comment     Comment: (NOTE) Protein electrophoresis scan will follow via computer, mail, or courier delivery.    GLOBULIN, TOTAL 2.6 2.2 - 3.9 g/dL   A/G Ratio 1.2 0.7 - 1.7    Comment: Performed at [facility], [place] [address], [place], [place] [address]  Glucose, capillary     Status: Abnormal   Collection Time: 09/07/17  7:44 AM  Result Value Ref Range   Glucose-Capillary 149 (H) 65 - 99 mg/dL  Hepatitis C antibody     Status: None   Collection Time: 09/07/17  8:21 AM  Result Value Ref Range   HCV Ab <0.1 0.0 - 0.9 s/co ratio    Comment: (NOTE)                                  Negative:     < 0.8                             Indeterminate: 0.8 - 0.9                                  Positive:     > 0.9 The CDC recommends that a positive HCV antibody result be followed up with a  HCV Nucleic Acid Amplification test (502774). Performed At: [facility] [place], [place] [id number] Rush Farmer MD [id number] Performed at [facility], [place] [address], [place], [place] [address]   Hepatitis B surface antigen     Status: None   Collection Time: 09/07/17  8:21 AM  Result Value Ref Range   Hepatitis B Surface Ag Negative Negative    Comment: RESULT CALLED TO, READ BACK BY AND VERIFIED WITH: C.BLUM,RN 09/08/17 '@1040'  BY V.WILKINS (NOTE) STAT RESULT FAXED TO [phone]  09/07/2017 1438 HRS Performed At: [facility] [place], [place] [id number] Rush Farmer MD GY:[id number] Performed at [facility], [place] [address] [place], [place] [address]   Ceruloplasmin     Status: None   Collection Time: 09/07/17  8:21 AM  Result Value Ref Range   Ceruloplasmin 21.9 19.0 - 39.0 mg/dL    Comment: (NOTE) Performed At: [facility] [place], [place] [id number] Rush Farmer MD SV:[id number] Performed at [facility], [place] [address], [place], [place] [address]   Alpha-1-antitrypsin     Status: None   Collection Time: 09/07/17  8:21 AM  Result Value Ref Range   A-1 Antitrypsin, Ser 191 90 - 200 mg/dL    Comment: (NOTE) Performed At: [facility] [place], [place] [id number] Rush Farmer MD [id number] Performed at [facility], [place] [address] [place],  [place] [address]   Glucose, capillary     Status: Abnormal   Collection Time: 09/07/17  8:51 AM  Result Value Ref Range   Glucose-Capillary 136 (H) 65 - 99 mg/dL  Glucose, capillary     Status: Abnormal   Collection Time: 09/07/17 11:41 AM  Result Value Ref Range   Glucose-Capillary 155 (H) 65 - 99 mg/dL    Objective: General: Patient is awake, alert, and oriented x 3 and in no acute distress.  Integument: Skin is warm, dry and supple  bilateral. Nails are tender, long, thickened and  dystrophic with subungual debris, consistent with onychomycosis, 1-5 bilateral. Left great toe healed from previous nail surgery. No signs of infection. No open lesions or preulcerative lesions present bilateral. Remaining integument unremarkable.  Vasculature:  Dorsalis Pedis pulse 2/4 bilateral. Posterior Tibial pulse  1/4 bilateral.  Capillary fill time <3 sec 1-5 bilateral. Positive hair growth to the level of the digits. Temperature gradient within normal limits. No varicosities present bilateral. No edema present bilateral.   Neurology: The patient has intact sensation measured with a 5.07/10g Semmes Weinstein Monofilament at all pedal sites bilateral . Vibratory sensation diminished bilateral with tuning fork. No Babinski sign present bilateral.   Musculoskeletal: No symptomatic pedal deformities noted bilateral. Muscular strength 5/5 in all lower extremity muscular groups bilateral without pain on range of motion . No tenderness with calf compression bilateral.  Assessment and Plan: Problem List Items Addressed This Visit      Endocrine   Diabetes mellitus without complication ([place])    Other Visit Diagnoses    Pain due to onychomycosis of toenails of both feet    -  Primary   S/P nail surgery       Toe pain, left          -Examined patient. -Discussed and educated patient on diabetic foot care, especially with  regards to the vascular, neurological and musculoskeletal systems.  -Stressed the importance of good glycemic control and the detriment of not  controlling glucose levels in relation to the foot. -Mechanically debrided all nails 1-5 bilateral using sterile nail nipper and filed with dremel without incident  -Answered all patient questions -Patient to return  in 3 months for at risk foot care -Patient advised to call the office if any problems or questions arise in the meantime.  Landis Martins, DPM

## 2017-10-24 ENCOUNTER — Ambulatory Visit: Payer: Medicaid Other | Admitting: Neurology

## 2017-10-24 ENCOUNTER — Encounter: Payer: Self-pay | Admitting: Neurology

## 2017-10-24 VITALS — BP 122/69 | HR 82 | Wt 234.5 lb

## 2017-10-24 DIAGNOSIS — G4733 Obstructive sleep apnea (adult) (pediatric): Secondary | ICD-10-CM | POA: Diagnosis not present

## 2017-10-24 DIAGNOSIS — F39 Unspecified mood [affective] disorder: Secondary | ICD-10-CM

## 2017-10-24 DIAGNOSIS — R351 Nocturia: Secondary | ICD-10-CM

## 2017-10-24 DIAGNOSIS — G4719 Other hypersomnia: Secondary | ICD-10-CM | POA: Diagnosis not present

## 2017-10-24 DIAGNOSIS — R0683 Snoring: Secondary | ICD-10-CM | POA: Diagnosis not present

## 2017-10-24 NOTE — Progress Notes (Signed)
Subjective:    Patient ID: Rhonda Higgins is a 47 y.o. female.  HPI     Star Age, MD, PhD [facility] [address], [address] [place], [place] [address]  Dear Dr. Ronnald Ramp,   Saw your patient, Rhonda Higgins, upon your kind request in my neurologic clinic today for initial consultation of her sleep disorder, in particular, concern for underlying obstructive sleep apnea. The patient is unaccompanied today. As you know, [name] is a 47 year old right-handed woman with an underlying medical history of diabetes, hyperlipidemia, hypothyroidism, reflux disease, and morbid obesity with a BMI of over 40, who was previously diagnosed with obstructive sleep apnea some years ago and placed on CPAP therapy. She is unable to use her current CPAP machine. I reviewed your office note from 07/06/2017, which you kindly included. Her Epworth sleepiness score is 13 out of 24, fatigue score is 20 out of 63. Of note, she is on several potentially sedating medications including gabapentin, Lamictal and Risperdal. She also takes Wellbutrin long-acting 150 mg at bedtime. She lives with her wife, Elmyra Ricks, they have no children. She is a nonsmoker and does not utilize alcohol, drinks caffeine in the form of coffee, typically 1 cup per day on average. She sleeps in a recliner due to back pain. She has not been able to use her CPAP in several years, probably used it fairly consistently the first year. She had sleep study testing in 2011 under Dr. Gwenette Greet, she was diagnosed with severe sleep apnea. She was using a fullface mask and this is the mask she would prefer but it was leaking. She could not tolerate a nasal mask. Her DME company is [employer]. Bedtime is typically between 11 and 11:30, rise time between 9:30 and 10:30. She has nocturia about once per average night, she does not have any morning headaches typically. She would be willing to try CPAP again. She reports being fairly  stable with her weight. She sees [facility] for her mood disorder and reports a diagnosis of borderline personality disorder as well as depression. Of note, she takes the Risperdal in the morning and takes the Wellbutrin at night. She reports difficulty falling asleep at night.   Her Past Medical History Is Significant For: Past Medical History:  Diagnosis Date  . Adenocarcinoma (HCC)    endometrial, FIGO GRADE 1  . Allergic rhinitis   . Atypical chest pain    History of  . Depression   . Elevated liver enzymes   . GERD (gastroesophageal reflux disease)   . Hematuria   . History of endometrial cancer 08-02-2009   oncologist-  dr brewster/ Denman George and dr kinard/  no recurrence   endometrial adenocarinoma Stage 1B, Grade 1, FIGO--  s/p  TAH w/ BSO and pelvic lymph node dissection's and radiation therapy  . History of kidney stones   . History of radiation therapy    2011  pelvic intracavity brachytherapy treatment's for endometrial carcinoma  . History of thyroid nodule    multinodular goiter s/p  total thyroidectomy 11-19-2015  per pathology -  adenomatoid nodules  . Hyperlipidemia   . Hypothyroidism, postsurgical   . 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 Left ureteral stone   . Obesity   . OSA (obstructive sleep apnea)    severe OSA  per study 03-08-2010--  noncomplant cpap  . Overactive bladder   . Personality disorder ([place])   . Polyphagia(783.6)   .  PONV (postoperative nausea and vomiting)    after ear surgery only one time  . Right lower quadrant pain   . Urgency of urination   . UTI (urinary tract infection)     Her Past Surgical History Is Significant For: Past Surgical History:  Procedure Laterality Date  . CARDIOVASCULAR STRESS TEST  06/09/2008   normal nuclear study w/ no ischemia/  normal LV function and wall motion , ef 83%  . CHOLECYSTECTOMY N/A 09/06/2017   Procedure: LAPAROSCOPIC CHOLECYSTECTOMY WITH INTRAOPERATIVE CHOLANGIOGRAM;   Surgeon: Armandina Gemma, MD;  Location: [facility];  Service: General;  Laterality: N/A;  . CYSTOSCOPY/RETROGRADE/URETEROSCOPY/STONE EXTRACTION WITH BASKET Left 03/08/2016   Procedure: CYSTOSCOPY/RETROGRADE/URETEROSCOPY/STONE EXTRACTION WITH BASKET, STENT PLACEMENT;  Surgeon: Rana Snare, MD;  [facility];  Service: Urology;  Laterality: Left;  . ENDOMETRIAL BIOPSY    . ERCP N/A 09/07/2017   Procedure: ENDOSCOPIC RETROGRADE CHOLANGIOPANCREATOGRAPHY (ERCP);  Surgeon: [name], MD;  Location: [facility];  Service: Gastroenterology;  Laterality: N/A;  . HOLMIUM LASER APPLICATION Left 2/99/3716   Procedure: HOLMIUM LASER APPLICATION;  Surgeon: Rana Snare, MD;  Location: [facility];  Service: Urology;  Laterality: Left;  . MOUTH SURGERY    . MYRINGECOTMY W/ REMOVAL MIDDLE EAR CHOLESTEATOMA TYPE 1 FASICA TYMPANOPLASTY  09/13/2000  . ROBOTIC ASSISTED TOTAL HYSTERECTOMY WITH BILATERAL SALPINGO OOPHERECTOMY  08-02-2009   at [facility]  dr Denman George   w/  Bilateral pelvic and para aortic lymph node dissection's  . THYROIDECTOMY N/A 11/19/2015   Procedure: TOTAL THYROIDECTOMY;  Surgeon: Armandina Gemma, MD;  Location: [facility];  Service: General;  Laterality: N/A;  . TONSILLECTOMY  age 47  . TRANSTHORACIC ECHOCARDIOGRAM  07/19/2014   ef 55-60%/  trivial TR  . TYMPANOPLASTY Right 1993    Her Family History Is Significant For: Family History  Problem Relation Age of Onset  . Heart disease Sister   . Heart attack Brother   . Heart disease Brother   . Heart disease Sister   . Diabetes Sister   . Asthma Mother   . Heart disease Mother   . Diabetes Mother   . Emphysema Mother   . Hypertension Mother   . Stroke Mother   . Prostate cancer Father     Her Social History Is Significant For: Social History   Socioeconomic History  . Marital status: Married    Spouse name: Not on file  . Number of children: N  . Years of education: Not on file  . Highest education level:  Not on file  Occupational History  . Occupation: n/a  Social Needs  . Financial resource strain: Not on file  . Food insecurity:    Worry: Not on file    Inability: Not on file  . Transportation needs:    Medical: Not on file    Non-medical: Not on file  Tobacco Use  . Smoking status: Never Smoker  . Smokeless tobacco: Never Used  Substance and Sexual Activity  . Alcohol use: No  . Drug use: No  . Sexual activity: Yes    Birth control/protection: None  Lifestyle  . Physical activity:    Days per week: Not on file    Minutes per session: Not on file  . Stress: Not on file  Relationships  . Social connections:    Talks on phone: Not on file    Gets together: Not on file    Attends religious service: Not on file    Active member of club  or organization: Not on file    Attends meetings of clubs or organizations: Not on file    Relationship status: Not on file  Other Topics Concern  . Not on file  Social History Narrative   Lives with partner, [name].    Her Allergies Are:  Allergies  Allergen Reactions  . Hydrocodone Shortness Of Breath  . Codeine Itching  . Adhesive [Tape] Rash    Paper tape is ok  :   Her Current Medications Are:  Outpatient Encounter Medications as of 10/24/2017  Medication Sig  . Ascorbic Acid (VITAMIN C) 1000 MG tablet Take 1,000 mg by mouth daily.  [name] aspirin EC 81 MG tablet Take 162 mg by mouth at bedtime.  [name] buPROPion (WELLBUTRIN XL) 150 MG 24 hr tablet Take 150 mg by mouth at bedtime.   . cetirizine (ZYRTEC) 10 MG tablet Take 10 mg by mouth at bedtime.   . gabapentin (NEURONTIN) 400 MG capsule Take 400-1,200 mg by mouth 3 (three) times daily. Pt takes one capsule in the morning, one in the afternoon, and three at bedtime.  [name] glimepiride (AMARYL) 4 MG tablet Take 4 mg by mouth 2 (two) times daily.   [name] ibuprofen (ADVIL,MOTRIN) 800 MG tablet Take 800 mg by mouth every 8 (eight) hours as needed for moderate pain.  [name] lamoTRIgine (LAMICTAL)  200 MG tablet Take 200 mg by mouth at bedtime.   [name] LANTUS SOLOSTAR 100 UNIT/ML Solostar Pen Inject 20 Units into the skin at bedtime.  [name] levothyroxine (SYNTHROID, LEVOTHROID) 137 MCG tablet Take 137 mcg by mouth daily before breakfast.   . lisinopril (PRINIVIL,ZESTRIL) 5 MG tablet Take 5 mg by mouth at bedtime.   . Multiple Vitamins-Minerals (MULTIVITAMIN WITH MINERALS) tablet Take 1 tablet by mouth daily.  . pantoprazole (PROTONIX) 40 MG tablet Take 40 mg by mouth every morning.   . risperiDONE (RISPERDAL) 2 MG tablet Take 2 mg by mouth at bedtime.  . simvastatin (ZOCOR) 40 MG tablet Take 40 mg by mouth at bedtime.   . sitaGLIPtin-metformin (JANUMET) 50-1000 MG tablet Take 1 tablet by mouth 2 (two) times daily with a meal.  . traMADol (ULTRAM) 50 MG tablet Take 1-2 tablets (50-100 mg total) by mouth every 6 (six) hours as needed for moderate pain (mild pain, refractory to tylneol).   No facility-administered encounter medications on file as of 10/24/2017.   :  Review of Systems:  Out of a complete 14 point review of systems, all are reviewed and negative with the exception of these symptoms as listed below: Review of Systems  Neurological:       Epworth Sleepiness Scale 0= would never doze 1= slight chance of dozing 2= moderate chance of dozing 3= high chance of dozing  Sitting and reading:2 Watching TV:2 Sitting inactive in a public place (ex. Theater or meeting):2 As a passenger in a car for an hour without a break:2 Lying down to rest in the afternoon:2 Sitting and talking to someone:1 Sitting quietly after lunch (no alcohol):2 In a car, while stopped in traffic:0 Total:13     Objective:  Neurological Exam  Physical Exam Physical Examination:   Vitals:   10/24/17 0850  BP: 122/69  Pulse: 82    General Examination: The patient is a very pleasant 47 y.o. female in no acute distress. She appears well-developed and well-nourished and well groomed.   HEENT:  Normocephalic, atraumatic, pupils are equal, round and reactive to light and accommodation. Extraocular tracking is good without limitation  to gaze excursion or nystagmus noted. Normal smooth pursuit is noted. Hearing is grossly intact. Tympanic membranes are clear bilaterally. Face is symmetric with normal facial animation and normal facial sensation. Speech is clear with no dysarthria noted. There is no hypophonia. There is no lip, neck/head, jaw or voice tremor. Neck is supple with full range of passive and active motion. There are no carotid bruits on auscultation. Oropharynx exam reveals: moderate mouth dryness, adequate dental hygiene and moderate airway crowding, due to small airway entry and redundant soft palate. Tonsils are absent. Mallampati is class III. She has a tongue piercing. Neck circumference is 17-1/8 inches.  Chest: Clear to auscultation without wheezing, rhonchi or crackles noted.  Heart: S1+S2+0, regular and normal without murmurs, rubs or gallops noted.   Abdomen: Soft, non-tender and non-distended with normal bowel sounds appreciated on auscultation.  Extremities: There is no pitting edema in the distal lower extremities bilaterally.   Skin: Warm and dry without trophic changes noted.  Musculoskeletal: exam reveals no obvious joint deformities, tenderness or joint swelling or erythema.   Neurologically:  Mental status: The patient is awake, alert and oriented in all 4 spheres. Her immediate and remote memory, attention, language skills and fund of knowledge are appropriate. There is no evidence of aphasia, agnosia, apraxia or anomia. Speech is clear with normal prosody and enunciation. Thought process is linear. Mood is normal and affect is normal.  Cranial nerves II - XII are as described above under HEENT exam. In addition: shoulder shrug is normal with equal shoulder height noted. Motor exam: Normal bulk, strength and tone is noted. There is no drift, tremor or rebound.  Romberg is negative, With the exception of initial sway. Fine motor skills and coordination: intact grossly. Cerebellar testing: No dysmetria or intention tremor.  Sensory exam: intact to light touch in the upper and lower extremities.  Gait, station and balance: She stands easily. No veering to one side is noted. No leaning to one side is noted. Posture is age-appropriate and stance is narrow based. Gait shows normal stride length and normal pace. No problems turning are noted. Tandem walk is unremarkable.   Assessment and Plan:  In summary, Alveena L Higgins is a very pleasant 47 y.o.-year old female with an underlying medical history of diabetes, hyperlipidemia, hypothyroidism, reflux disease, and morbid obesity with a BMI of over 40, who presents for reevaluation of her prior diagnosis of sleep apnea. She was previously diagnosed with severe obstructive sleep apnea but has not been able to use CPAP consistently. She would benefit from reevaluation. I had a long chat with the patient and Elmyra Ricks about my findings and the diagnosis of OSA, its prognosis and treatment options. We talked about medical treatments, surgical interventions and non-pharmacological approaches. I explained in particular the risks and ramifications of untreated moderate to severe OSA, especially with respect to developing cardiovascular disease down the Road, including congestive heart failure, difficult to treat hypertension, cardiac arrhythmias, or stroke. Even type 2 diabetes has, in part, been linked to untreated OSA. Symptoms of untreated OSA include daytime sleepiness, memory problems, mood irritability and mood disorder such as depression and anxiety, lack of energy, as well as recurrent headaches, especially morning headaches. We talked about trying to maintain a healthy lifestyle in general, as well as the importance of weight control. I encouraged the patient to eat healthy, exercise daily and keep well hydrated, to keep a  scheduled bedtime and wake time routine, to not skip any meals and eat healthy  snacks in between meals. I advised the patient not to drive when feeling sleepy. I recommended the following at this time: sleep study with potential positive airway pressure titration. (We will score hypopneas at 4%).  I also explained the CPAP treatment option to the patient, who indicated that she would be willing to try CPAP again if the need arises. I explained the importance of being compliant with PAP treatment, not only for insurance purposes but primarily to improve Her symptoms, and for the patient's long term health benefit, including to reduce Her cardiovascular risks. I answered all their questions today and the patient and Elmyra Ricks were in agreement. I would like to see her back after the sleep study is completed and encouraged her to call with any interim questions, concerns, problems or updates.   Thank you very much for allowing me to participate in the care of this nice patient. If I can be of any further assistance to you please do not hesitate to call me at [phone].  Sincerely,   Star Age, MD, PhD

## 2017-10-24 NOTE — Patient Instructions (Signed)

## 2017-11-14 ENCOUNTER — Encounter (HOSPITAL_COMMUNITY): Payer: Self-pay

## 2017-11-14 ENCOUNTER — Emergency Department (HOSPITAL_COMMUNITY)
Admission: EM | Admit: 2017-11-14 | Discharge: 2017-11-15 | Disposition: A | Discharge status: Home / Self Care | Payer: Medicaid Other | Attending: Emergency Medicine | Admitting: Emergency Medicine

## 2017-11-14 ENCOUNTER — Other Ambulatory Visit: Payer: Self-pay

## 2017-11-14 DIAGNOSIS — R1030 Lower abdominal pain, unspecified: Secondary | ICD-10-CM | POA: Diagnosis not present

## 2017-11-14 DIAGNOSIS — Z7982 Long term (current) use of aspirin: Secondary | ICD-10-CM | POA: Diagnosis not present

## 2017-11-14 DIAGNOSIS — Z79899 Other long term (current) drug therapy: Secondary | ICD-10-CM | POA: Insufficient documentation

## 2017-11-14 DIAGNOSIS — E119 Type 2 diabetes mellitus without complications: Secondary | ICD-10-CM | POA: Diagnosis not present

## 2017-11-14 DIAGNOSIS — D069 Carcinoma in situ of cervix, unspecified: Secondary | ICD-10-CM | POA: Insufficient documentation

## 2017-11-14 DIAGNOSIS — Z7984 Long term (current) use of oral hypoglycemic drugs: Secondary | ICD-10-CM | POA: Insufficient documentation

## 2017-11-14 DIAGNOSIS — R1084 Generalized abdominal pain: Secondary | ICD-10-CM

## 2017-11-14 LAB — URINALYSIS, ROUTINE W REFLEX MICROSCOPIC
Bilirubin Urine: NEGATIVE
Glucose, UA: NEGATIVE mg/dL
Hgb urine dipstick: NEGATIVE
Ketones, ur: 5 mg/dL — AB
Leukocytes, UA: NEGATIVE
Nitrite: NEGATIVE
Protein, ur: NEGATIVE mg/dL
Specific Gravity, Urine: 1.021 (ref 1.005–1.030)
pH: 6 (ref 5.0–8.0)

## 2017-11-14 LAB — COMPREHENSIVE METABOLIC PANEL
ALT: 81 U/L — ABNORMAL HIGH (ref 14–54)
AST: 143 U/L — ABNORMAL HIGH (ref 15–41)
Albumin: 4 g/dL (ref 3.5–5.0)
Alkaline Phosphatase: 95 U/L (ref 38–126)
Anion gap: 10 (ref 5–15)
BUN: 11 mg/dL (ref 6–20)
CO2: 25 mmol/L (ref 22–32)
Calcium: 8.6 mg/dL — ABNORMAL LOW (ref 8.9–10.3)
Chloride: 105 mmol/L (ref 101–111)
Creatinine, Ser: 0.65 mg/dL (ref 0.44–1.00)
GFR calc Af Amer: >60 mL/min (ref >60)
GFR calc non Af Amer: >60 mL/min (ref >60)
Glucose, Bld: 143 mg/dL — ABNORMAL HIGH (ref 65–99)
Potassium: 3.9 mmol/L (ref 3.5–5.1)
Sodium: 140 mmol/L (ref 135–145)
Total Bilirubin: 0.7 mg/dL (ref 0.3–1.2)
Total Protein: 6.9 g/dL (ref 6.5–8.1)

## 2017-11-14 LAB — CBC
HCT: 43.4 % (ref 36.0–46.0)
Hemoglobin: 14 g/dL (ref 12.0–15.0)
MCH: 27.2 pg (ref 26.0–34.0)
MCHC: 32.3 g/dL (ref 30.0–36.0)
MCV: 84.4 fL (ref 78.0–100.0)
Platelets: 103 10*3/uL — ABNORMAL LOW (ref 150–400)
RBC: 5.14 MIL/uL — ABNORMAL HIGH (ref 3.87–5.11)
RDW: 14.1 % (ref 11.5–15.5)
WBC: 6.1 10*3/uL (ref 4.0–10.5)

## 2017-11-14 LAB — LIPASE, BLOOD: Lipase: 35 U/L (ref 11–51)

## 2017-11-14 MED ORDER — SODIUM CHLORIDE 0.9 % IV BOLUS: 1000.0000 mL | Freq: Once | INTRAVENOUS | Status: DC

## 2017-11-14 MED ORDER — KETOROLAC TROMETHAMINE 30 MG/ML IJ SOLN
15.0000 mg | Freq: Once | INTRAMUSCULAR | Status: DC
Start: 2017-11-14 — End: 2017-11-15

## 2017-11-14 NOTE — ED Provider Notes (Signed)
[facility] DEPT Provider Note   CSN: [id number] Arrival date & time: 11/14/17  1532     History   Chief Complaint Chief Complaint  Patient presents with  . Abdominal Pain  . Nausea  . Diarrhea    HPI Rhonda Higgins is a 47 y.o. female.  HPI Patient presents with concern of episodic abdominal pain. Over the past 2 days she has had about 10 episodes of transient abdominal pain. Pain is diffuse across the lower abdomen, sharp, lasts approximately 30 seconds per There are no precipitating, alleviating or exacerbating factors. There is associated nausea, which improves with Pepto-Bismol, but no change in the pain with this medication. Patient has a history of Rhonda Higgins, follows with [facility] gastroenterology. She is a company by a companion who assists with the HPI.  Past Medical History:  Diagnosis Date  . Adenocarcinoma (HCC)    endometrial, FIGO GRADE 1  . Allergic rhinitis   . Atypical chest pain    History of  . Depression   . Elevated liver enzymes   . GERD (gastroesophageal reflux disease)   . Hematuria   . History of endometrial cancer 08-02-2009   oncologist-  dr Higgins/ Denman Rhonda and dr kinard/  no recurrence   endometrial adenocarinoma Stage 1B, Grade 1, FIGO--  s/p  TAH w/ BSO and pelvic lymph node dissection's and radiation therapy  . History of kidney stones   . History of radiation therapy    2011  pelvic intracavity brachytherapy treatment's for endometrial carcinoma  . History of thyroid nodule    multinodular goiter s/p  total thyroidectomy 11-19-2015  per pathology -  adenomatoid nodules  . Hyperlipidemia   . Hypothyroidism, postsurgical   . 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 Left ureteral stone   . Obesity   . OSA (obstructive sleep apnea)    severe OSA  per study 03-08-2010--  noncomplant cpap  . Overactive bladder   . Personality disorder ([place])   . Polyphagia(783.6)   . PONV (postoperative nausea  and vomiting)    after ear surgery only one time  . Right lower quadrant pain   . Urgency of urination   . UTI (urinary tract infection)     Patient Active Problem List   Diagnosis Date Noted  . Cholelithiasis with chronic cholecystitis 09/06/2017  . Multinodular goiter (nontoxic) 11/17/2015  . Chest pain 07/20/2014  . Diabetes mellitus without complication ([place]) 43/15/4008  . GERD (gastroesophageal reflux disease) 07/19/2014  . Personality disorder ([place])   . Uterine cancer ([place]) 06/22/2011  . DM 02/09/2010  . HLD (hyperlipidemia) 02/09/2010  . Obstructive sleep apnea 02/09/2010  . Allergic rhinitis 02/09/2010  . History of uterine cancer 02/09/2010    Past Surgical History:  Procedure Laterality Date  . CARDIOVASCULAR STRESS TEST  06/09/2008   normal nuclear study w/ no ischemia/  normal LV function and wall motion , ef 83%  . CHOLECYSTECTOMY N/A 09/06/2017   Procedure: LAPAROSCOPIC CHOLECYSTECTOMY WITH INTRAOPERATIVE CHOLANGIOGRAM;  Surgeon: Rhonda Gemma, MD;  Location: [facility];  Service: General;  Laterality: N/A;  . CYSTOSCOPY/RETROGRADE/URETEROSCOPY/STONE EXTRACTION WITH BASKET Left 03/08/2016   Procedure: CYSTOSCOPY/RETROGRADE/URETEROSCOPY/STONE EXTRACTION WITH BASKET, STENT PLACEMENT;  Surgeon: Rhonda Snare, MD;  Location: [facility];  Service: Urology;  Laterality: Left;  . ENDOMETRIAL BIOPSY    . ERCP N/A 09/07/2017   Procedure: ENDOSCOPIC RETROGRADE CHOLANGIOPANCREATOGRAPHY (ERCP);  Surgeon: Rhonda Juniper, MD;  Location: [facility];  Service: Gastroenterology;  Laterality: N/A;  .  HOLMIUM LASER APPLICATION Left 12/27/6267   Procedure: HOLMIUM LASER APPLICATION;  Surgeon: Rhonda Snare, MD;  Location: [facility];  Service: Urology;  Laterality: Left;  . MOUTH SURGERY    . MYRINGECOTMY W/ REMOVAL MIDDLE EAR CHOLESTEATOMA TYPE 1 FASICA TYMPANOPLASTY  09/13/2000  . ROBOTIC ASSISTED TOTAL HYSTERECTOMY WITH BILATERAL SALPINGO OOPHERECTOMY   08-02-2009   at [facility]  dr Denman Rhonda   w/  Bilateral pelvic and para aortic lymph node dissection's  . THYROIDECTOMY N/A 11/19/2015   Procedure: TOTAL THYROIDECTOMY;  Surgeon: Rhonda Gemma, MD;  Location: [facility];  Service: General;  Laterality: N/A;  . TONSILLECTOMY  age 44  . TRANSTHORACIC ECHOCARDIOGRAM  07/19/2014   ef 55-60%/  trivial TR  . TYMPANOPLASTY Right 1993     OB History    Gravida  0   Para      Term      Preterm      AB      Living        SAB      TAB      Ectopic      Multiple      Live Births               Home Medications    Prior to Admission medications   Medication Sig Start Date End Date Taking? Authorizing Provider  Ascorbic Acid (VITAMIN C) 1000 MG tablet Take 1,000 mg by mouth daily.   Yes [provider]  aspirin EC 81 MG tablet Take 81 mg by mouth at bedtime.    Yes [provider]  buPROPion (WELLBUTRIN XL) 150 MG 24 hr tablet Take 150 mg by mouth daily.    Yes [provider]  cetirizine (ZYRTEC) 10 MG tablet Take 10 mg by mouth at bedtime.    Yes [provider]  gabapentin (NEURONTIN) 400 MG capsule Take 400-1,200 mg by mouth 3 (three) times daily. Pt takes one capsule in the morning, one in the afternoon, and three at bedtime.   Yes [provider]  glimepiride (AMARYL) 4 MG tablet Take 4 mg by mouth 2 (two) times daily.    Yes [provider]  lamoTRIgine (LAMICTAL) 200 MG tablet Take 200 mg by mouth at bedtime.    Yes [provider]  LANTUS SOLOSTAR 100 UNIT/ML Solostar Pen Inject 20 Units into the skin at bedtime. 11/03/16  Yes [provider]  levothyroxine (SYNTHROID, LEVOTHROID) 137 MCG tablet Take 137 mcg by mouth daily before breakfast.    Yes [provider]  lisinopril (PRINIVIL,ZESTRIL) 5 MG tablet Take 5 mg by mouth at bedtime.    Yes [provider]  Multiple Vitamins-Minerals (MULTIVITAMIN WITH MINERALS) tablet Take 1 tablet by mouth every  evening.    Yes [provider]  pantoprazole (PROTONIX) 40 MG tablet Take 40 mg by mouth every morning.    Yes [provider]  risperiDONE (RISPERDAL) 2 MG tablet Take 2 mg by mouth daily.  08/22/16  Yes [provider]  simvastatin (ZOCOR) 40 MG tablet Take 40 mg by mouth at bedtime.    Yes [provider]  sitaGLIPtin-metformin (JANUMET) 50-1000 MG tablet Take 1 tablet by mouth 2 (two) times daily with a meal.   Yes [provider]  ibuprofen (ADVIL,MOTRIN) 800 MG tablet Take 800 mg by mouth every 8 (eight) hours as needed for moderate pain.    [provider]  traMADol (ULTRAM) 50 MG tablet Take 1-2 tablets (50-100 mg  total) by mouth every 6 (six) hours as needed for moderate pain (mild pain, refractory to tylneol). Patient not taking: Reported on 11/14/2017 09/07/17   Rhonda Gemma, MD    Family History Family History  Problem Relation Age of Onset  . Heart disease Sister   . Heart attack Brother   . Heart disease Brother   . Heart disease Sister   . Diabetes Sister   . Asthma Mother   . Heart disease Mother   . Diabetes Mother   . Emphysema Mother   . Hypertension Mother   . Stroke Mother   . Prostate cancer Father     Social History Social History   Tobacco Use  . Smoking status: Never Smoker  . Smokeless tobacco: Never Used  Substance Use Topics  . Alcohol use: No  . Drug use: No     Allergies   Hydrocodone; Codeine; and Adhesive [tape]   Review of Systems Review of Systems  Constitutional:       Per HPI, otherwise negative  HENT:       Per HPI, otherwise negative  Respiratory:       Per HPI, otherwise negative  Cardiovascular:       Per HPI, otherwise negative  Gastrointestinal: Negative for vomiting.  Endocrine:       Negative aside from HPI  Genitourinary:       Neg aside from HPI   Musculoskeletal:       Per HPI, otherwise negative  Skin: Negative.   Neurological: Negative for syncope.      Physical Exam Updated Vital Signs BP 117/68   Pulse 74   Temp 98.4 F (36.9 C) (Oral)   Resp 18   Ht 5\' 3"  (1.6 m)   Wt 106.6 kg (235 lb)   SpO2 95%   BMI 41.63 kg/m   Physical Exam  Constitutional: She is oriented to person, place, and time. She appears well-developed and well-nourished. No distress.  HENT:  Head: Normocephalic and atraumatic.  Eyes: Conjunctivae and EOM are normal.  Cardiovascular: Normal rate and regular rhythm.  Pulmonary/Chest: Effort normal and breath sounds normal. No stridor. No respiratory distress.  Abdominal: She exhibits no distension. There is no tenderness. There is no rigidity and no guarding.  Musculoskeletal: She exhibits no edema.  Neurological: She is alert and oriented to person, place, and time. No cranial nerve deficit.  Skin: Skin is warm and dry.  Psychiatric: She has a normal mood and affect.  Nursing note and vitals reviewed.    ED Treatments / Results  Labs (all labs ordered are listed, but only abnormal results are displayed) Labs Reviewed  COMPREHENSIVE METABOLIC PANEL - Abnormal; Notable for the following components:      Result Value   Glucose, Bld 143 (*)    Calcium 8.6 (*)    AST 143 (*)    ALT 81 (*)    All other components within normal limits  CBC - Abnormal; Notable for the following components:   RBC 5.14 (*)    Platelets 103 (*)    All other components within normal limits  URINALYSIS, ROUTINE W REFLEX MICROSCOPIC - Abnormal; Notable for the following components:   Ketones, ur 5 (*)    All other components within normal limits  LIPASE, BLOOD     Procedures Procedures (including critical care time)  Medications Ordered in ED Medications  sodium chloride 0.9 % bolus 1,000 mL (1,000 mLs Intravenous Refused 11/14/17 2328)  ketorolac (TORADOL) 30 MG/ML injection  15 mg (15 mg Intravenous Not Given 11/14/17 2329)     Initial Impression / Assessment and Plan / ED Course  I have reviewed the triage  vital signs and the nursing notes.  Pertinent labs & imaging results that were available during my care of the patient were reviewed by me and considered in my medical decision making (see chart for details).     11:34 PM On repeat exam patient is awake and alert, in no distress per She had not yet received IV for this, but when asked if she would like to receive these, or would prefer oral rehydration given evidence for mild dehydration, the patient prefers the latter, with discharge. Patient's labs generally reassuring aside from repeat demonstration of her hepatic dysfunction, mild dehydration, but no evidence for bacteremia, sepsis, or other acute new findings per With no ongoing symptoms, and only episodic pain, the patient is appropriate for discharge with oral rehydration, follow-up with her gastroenterologist.   Final Clinical Impressions(s) / ED Diagnoses  Abdominal pain   Carmin Muskrat, MD 11/14/17 2335

## 2017-11-14 NOTE — ED Triage Notes (Signed)
Patient c/o mid abdominal pain, nausea, and diarhea since yesterday.

## 2017-11-14 NOTE — Discharge Instructions (Addendum)
As discussed, your evaluation today has been largely reassuring.  But, it is important that you monitor your condition carefully, and do not hesitate to return to the ED if you develop new, or concerning changes in your condition. ? ?Otherwise, please follow-up with your physician for appropriate ongoing care. ? ?

## 2017-11-14 NOTE — ED Notes (Signed)
Patient refused Zofran ODT when offered. 

## 2017-12-13 ENCOUNTER — Ambulatory Visit (INDEPENDENT_AMBULATORY_CARE_PROVIDER_SITE_OTHER): Payer: Medicaid Other | Admitting: Neurology

## 2017-12-13 DIAGNOSIS — G4720 Circadian rhythm sleep disorder, unspecified type: Secondary | ICD-10-CM

## 2017-12-13 DIAGNOSIS — G4719 Other hypersomnia: Secondary | ICD-10-CM

## 2017-12-13 DIAGNOSIS — F39 Unspecified mood [affective] disorder: Secondary | ICD-10-CM

## 2017-12-13 DIAGNOSIS — R351 Nocturia: Secondary | ICD-10-CM

## 2017-12-13 DIAGNOSIS — G4733 Obstructive sleep apnea (adult) (pediatric): Secondary | ICD-10-CM | POA: Diagnosis not present

## 2017-12-13 DIAGNOSIS — R0683 Snoring: Secondary | ICD-10-CM

## 2017-12-24 ENCOUNTER — Other Ambulatory Visit: Payer: Self-pay | Admitting: Gastroenterology

## 2017-12-24 DIAGNOSIS — R1030 Lower abdominal pain, unspecified: Secondary | ICD-10-CM

## 2017-12-25 ENCOUNTER — Telehealth: Payer: Self-pay | Admitting: Neurology

## 2017-12-25 NOTE — Telephone Encounter (Signed)
Pt request sleep study results

## 2017-12-25 NOTE — Progress Notes (Signed)
Patient referred by Dr. Ronnald Ramp, seen by me on 10/24/17, diagnostic PSG on 12/13/17.   Please call and notify the patient that the recent sleep study did not show any significant obstructive sleep apnea with the exception of snoring, which was mild to moderate, and mild REM related OSA. CPAP therapy is not warranted; avoidance of the supine sleep position and weight will likely suffice as treatment. For disturbing snoring, an oral appliance (through a qualified dentist) can be considered. Please inform patient that she can FU with PCP.  Please remind patient to try to maintain good sleep hygiene, which means: Keep a regular sleep and wake schedule and make enough time for sleep (7 1/2 to 8 1/2 hours for the average adult), try not to exercise or have a meal within 2 hours of your bedtime, try to keep your bedroom conducive for sleep, that is, cool and dark, without light distractors such as an illuminated alarm clock, and refrain from watching TV right before sleep or in the middle of the night and do not keep the TV or radio on during the night. If a nightlight is used, have it away from the visual field. Also, try not to use or play on electronic devices at bedtime, such as your cell phone, tablet PC or laptop. If you like to read at bedtime on an electronic device, try to dim the background light as much as possible. Do not eat in the middle of the night. Keep pets away from the bedroom environment. For stress relief, try meditation, deep breathing exercises (there are many books and CDs available), a white noise machine or fan can help to diffuse other noise distractors, such as traffic noise. Do not drink alcohol before bedtime, as it can disturb sleep and cause middle of the night awakenings. Never mix alcohol and sedating medications! Avoid narcotic pain medication close to bedtime, as opioids/narcotics can suppress breathing drive and breathing effort.   Once you have spoken to patient, you can close this  encounter.   Thanks,  Star Age, MD, PhD [facility])

## 2017-12-25 NOTE — Procedures (Signed)
PATIENT'S NAME:  Rhonda Higgins, Rhonda Higgins DOB:      1970-08-03      MR#:    [id number]     DATE OF RECORDING: 12/13/2017 REFERRING M.D.:  [name], MD Study Performed:   Baseline Polysomnogram HISTORY: 47 year old woman with a history of diabetes, hyperlipidemia, hypothyroidism, reflux disease, and morbid obesity, who was previously diagnosed with obstructive sleep apnea some years ago and placed on CPAP therapy. The patient endorsed the Epworth Sleepiness Scale at 13 points. The patient's weight 234 pounds with a height of 63 (inches), resulting in a BMI of 41.4 kg/m2. The patient's neck circumference measured 17 inches.  CURRENT MEDICATIONS: Aspirin, Wellbutrin, Zyrtec, Neurontin, Lamictal, Motrin, Amaryl, Synthroid, Lisinopril, Protonix, Risperdal, Zocor, Janumet, Ultram   PROCEDURE:  This is a multichannel digital polysomnogram utilizing the Somnostar 11.2 system.  Electrodes and sensors were applied and monitored per AASM Specifications.   EEG, EOG, Chin and Limb EMG, were sampled at 200 Hz.  ECG, Snore and Nasal Pressure, Thermal Airflow, Respiratory Effort, CPAP Flow and Pressure, Oximetry was sampled at 50 Hz. Digital video and audio were recorded.      BASELINE STUDY  Lights Out was at 22:39 and Lights On at 04:54.  Total recording time (TRT) was 375.5 minutes, with a total sleep time (TST) of  279 minutes.   The patient's sleep latency to consistent sleep was 52.5 minutes, which is delayed.  REM latency was 107 minutes.  The sleep efficiency was 74.3 %.     SLEEP ARCHITECTURE: WASO (Wake after sleep onset) was 63 minutes with mild to moderate sleep fragmentation noted. There were 21 minutes in Stage N1, 81 minutes Stage N2, 130 minutes Stage N3 and 47 minutes in Stage REM.  The percentage of Stage N1 was 7.5%, Stage N2 was 29.%, Stage N3 was 46.6%, which is increased, and Stage R (REM sleep) was 16.8%. The arousals were noted as: 30 were spontaneous, 0 were associated with PLMs, 4 were  associated with respiratory events.  RESPIRATORY ANALYSIS:  There were a total of 11 respiratory events:  0 obstructive apneas, 0 central apneas and 0 mixed apneas with a total of 0 apneas and an apnea index (AI) of 0 /hour. There were 11 hypopneas with a hypopnea index of 2.4 /hour. The patient also had 0 respiratory event related arousals (RERAs).      The total APNEA/HYPOPNEA INDEX (AHI) was 2.4/hour and the total RESPIRATORY DISTURBANCE INDEX was 0. 2.4 /hour.  9 events occurred in REM sleep and 4 events in NREM. The REM AHI was 11.5 /hour, versus a non-REM AHI of .5. The patient spent 279 minutes of total sleep time in the supine position and 0 minutes in non-supine.. The supine AHI was 2.4 versus a non-supine AHI of 0.0.  OXYGEN SATURATION & C02:  The Wake baseline 02 saturation was 94%, with the lowest being 84%. Time spent below 89% saturation equaled 6 minutes.  PERIODIC LIMB MOVEMENTS: The patient had a total of 0 Periodic Limb Movements.  The Periodic Limb Movement (PLM) index was 0 and the PLM Arousal index was 0/hour.  Audio and video analysis did not show any abnormal or unusual movements, behaviors, phonations or vocalizations. The patient took no bathroom breaks. Mild to moderate snoring was noted. The EKG was in keeping with normal sinus rhythm (NSR).  Post-study, the patient indicated that sleep was worse than usual.   IMPRESSION:  1. Primary Snoring 2. Dysfunctions associated with sleep stages or arousal from sleep  RECOMMENDATIONS:  1. This study does not demonstrate any significant obstructive or central sleep disordered breathing with the exception of snoring, which was mild to moderate, and mild REM related OSA. CPAP therapy is not warranted; avoidance of the supine sleep position and weight will likely suffice as treatment. For disturbing snoring, an oral appliance (through a qualified dentist) can be considered. This study does not support an intrinsic sleep disorder as  a cause of the patient's symptoms. Other causes, including circadian rhythm disturbances, an underlying mood disorder, medication effect and/or an underlying medical problem cannot be ruled out. 2. This study shows sleep fragmentation and abnormal sleep stage percentages; these are nonspecific findings and per se do not signify an intrinsic sleep disorder or a cause for the patient's sleep-related symptoms. Causes include (but are not limited to) the first night effect of the sleep study, circadian rhythm disturbances, medication effect or an underlying mood disorder or medical problem.  3. The patient should be cautioned not to drive, work at heights, or operate dangerous or heavy equipment when tired or sleepy. Review and reiteration of good sleep hygiene measures should be pursued with any patient. 4. The patient will be advised to follow up with the referring provider, who will be notified of the test results.  I certify that I have reviewed the entire raw data recording prior to the issuance of this report in accordance with the Standards of Accreditation of the American Academy of Sleep Medicine (AASM)  Star Age, MD, PhD Diplomat, American Board of Neurology and Sleep Medicine (Neurology and Sleep Medicine)

## 2017-12-26 ENCOUNTER — Telehealth: Payer: Self-pay | Admitting: Neurology

## 2017-12-26 NOTE — Telephone Encounter (Signed)
I called pt and discussed her sleep study results with her. I explained that this study did not show any significant osa, likely because she was propped up on pillows. However, pt's O2 sats did trend lower throughout the night. Pt does not qualify for a cpap based on this study, but would likely have more osa events if she was truly sleeping on her back, not propped up. Pt is agreeable to pursuing weight loss and then re-evaluating her sleep apnea. I offered pt an appt with Dr. Rexene Alberts to discuss this further but pt declined. Pt says that she will call us back after she has lost some weight and schedule an appt. Pt asked that I send a copy of her sleep study to Dr. Ronnald Ramp. Pt verbalized understanding of results. Pt had no questions at this time but was encouraged to call back if questions arise.

## 2017-12-26 NOTE — Telephone Encounter (Signed)
See telephone note from 12/26/17. 

## 2017-12-26 NOTE — Telephone Encounter (Signed)
I reviewed her sleep study one more time: she slept propped up on what looked like more than 2 pillows. She did not have obstructive events enough to diagnose OSA, likely for that reason. However, she has lower trending O2 sats all night and some events in REM sleep. While she does not qualify for CPAP based on this study, she likely would have OSA with more events when sleeping truly on her back, not propped up. I would recommend: weight loss and re-evaluation for sleep apnea post-weight loss. Please update patient with this information.

## 2017-12-30 ENCOUNTER — Encounter (HOSPITAL_COMMUNITY): Payer: Self-pay | Admitting: Emergency Medicine

## 2017-12-30 ENCOUNTER — Emergency Department (HOSPITAL_COMMUNITY): Payer: Medicaid Other

## 2017-12-30 ENCOUNTER — Other Ambulatory Visit: Payer: Self-pay

## 2017-12-30 ENCOUNTER — Emergency Department (HOSPITAL_COMMUNITY)
Admission: EM | Admit: 2017-12-30 | Discharge: 2017-12-30 | Disposition: A | Discharge status: Home / Self Care | Payer: Medicaid Other | Attending: Emergency Medicine | Admitting: Emergency Medicine

## 2017-12-30 DIAGNOSIS — Z794 Long term (current) use of insulin: Secondary | ICD-10-CM | POA: Diagnosis not present

## 2017-12-30 DIAGNOSIS — E119 Type 2 diabetes mellitus without complications: Secondary | ICD-10-CM | POA: Insufficient documentation

## 2017-12-30 DIAGNOSIS — Z79899 Other long term (current) drug therapy: Secondary | ICD-10-CM | POA: Diagnosis not present

## 2017-12-30 DIAGNOSIS — Z7982 Long term (current) use of aspirin: Secondary | ICD-10-CM | POA: Insufficient documentation

## 2017-12-30 DIAGNOSIS — E039 Hypothyroidism, unspecified: Secondary | ICD-10-CM | POA: Insufficient documentation

## 2017-12-30 DIAGNOSIS — D069 Carcinoma in situ of cervix, unspecified: Secondary | ICD-10-CM | POA: Insufficient documentation

## 2017-12-30 DIAGNOSIS — M79672 Pain in left foot: Secondary | ICD-10-CM | POA: Insufficient documentation

## 2017-12-30 IMAGING — CR DG ANKLE COMPLETE 3+V*L*
3 series · 3 of 3 positions shown · non-contrast
Comparison: None.

CLINICAL DATA: Pt reports having new onset of pain to left ankle
that started a week ago. No known injury to ankle and pain worse
with pressure.

EXAM:
LEFT ANKLE COMPLETE - 3+ VIEW

[x ankle ap left]
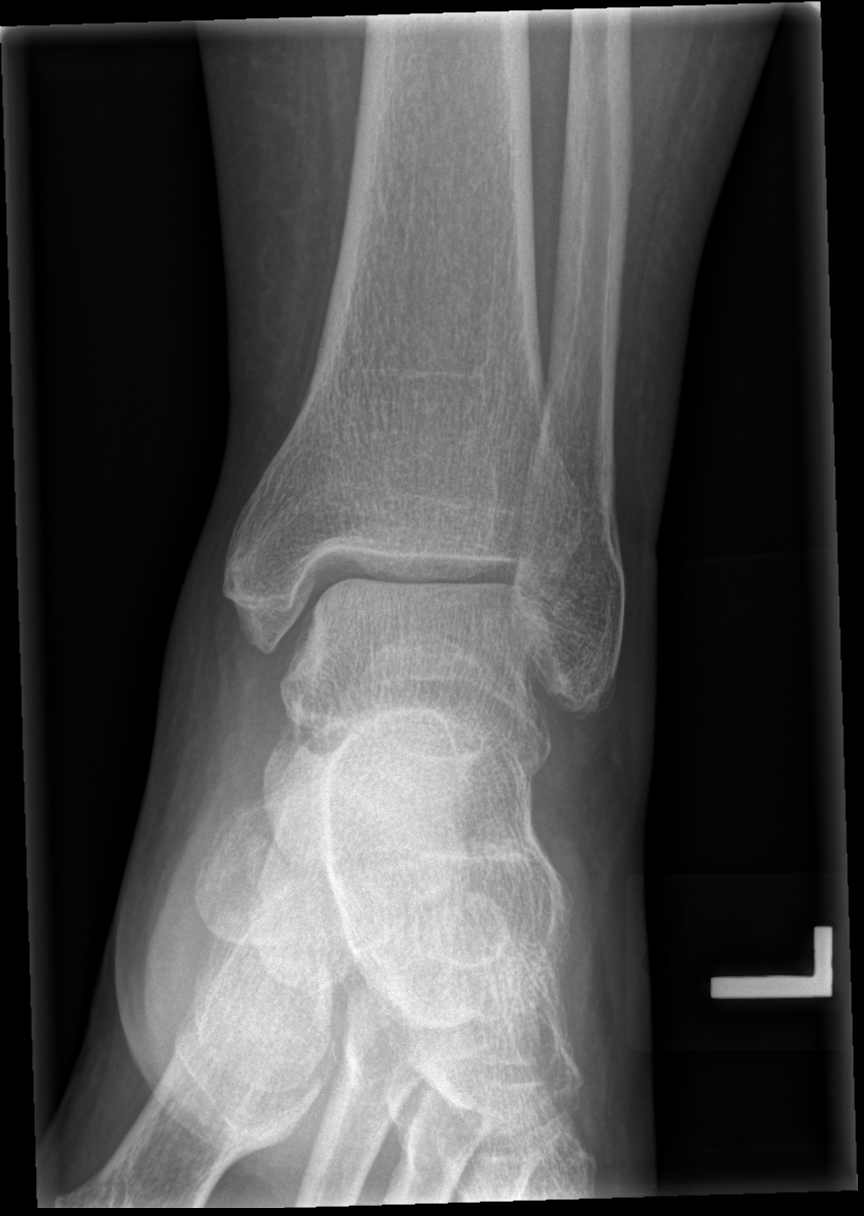

[x ankle obl left]
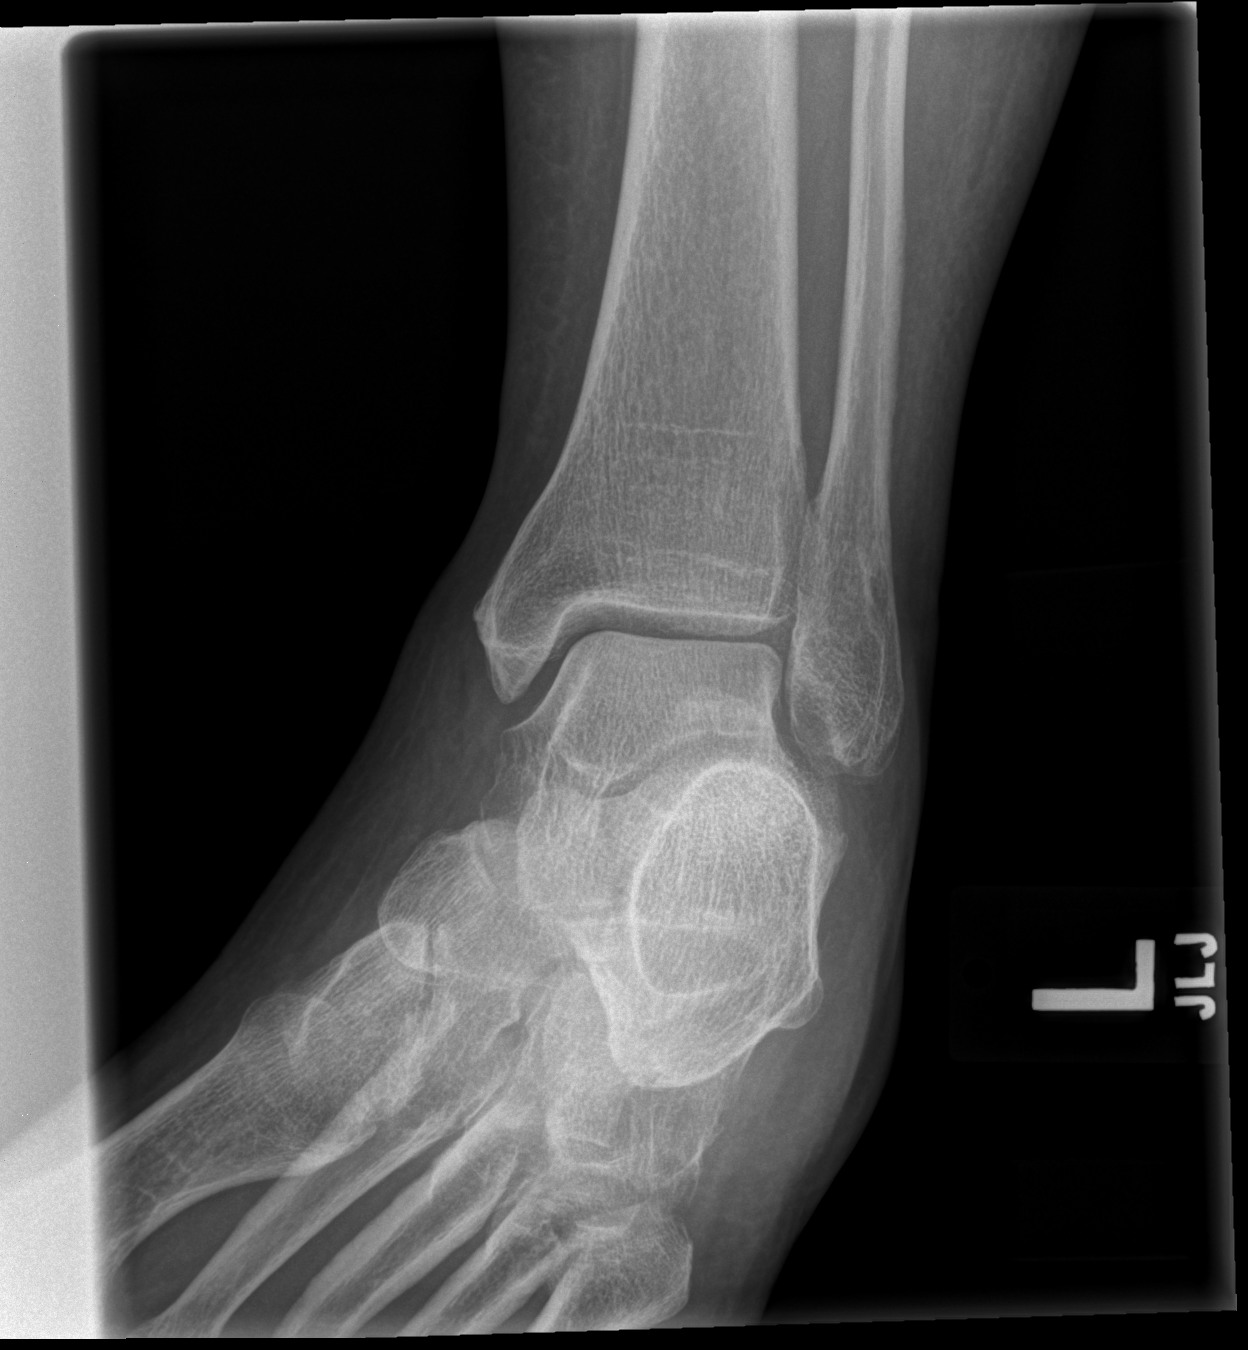

[x ankle lat left]
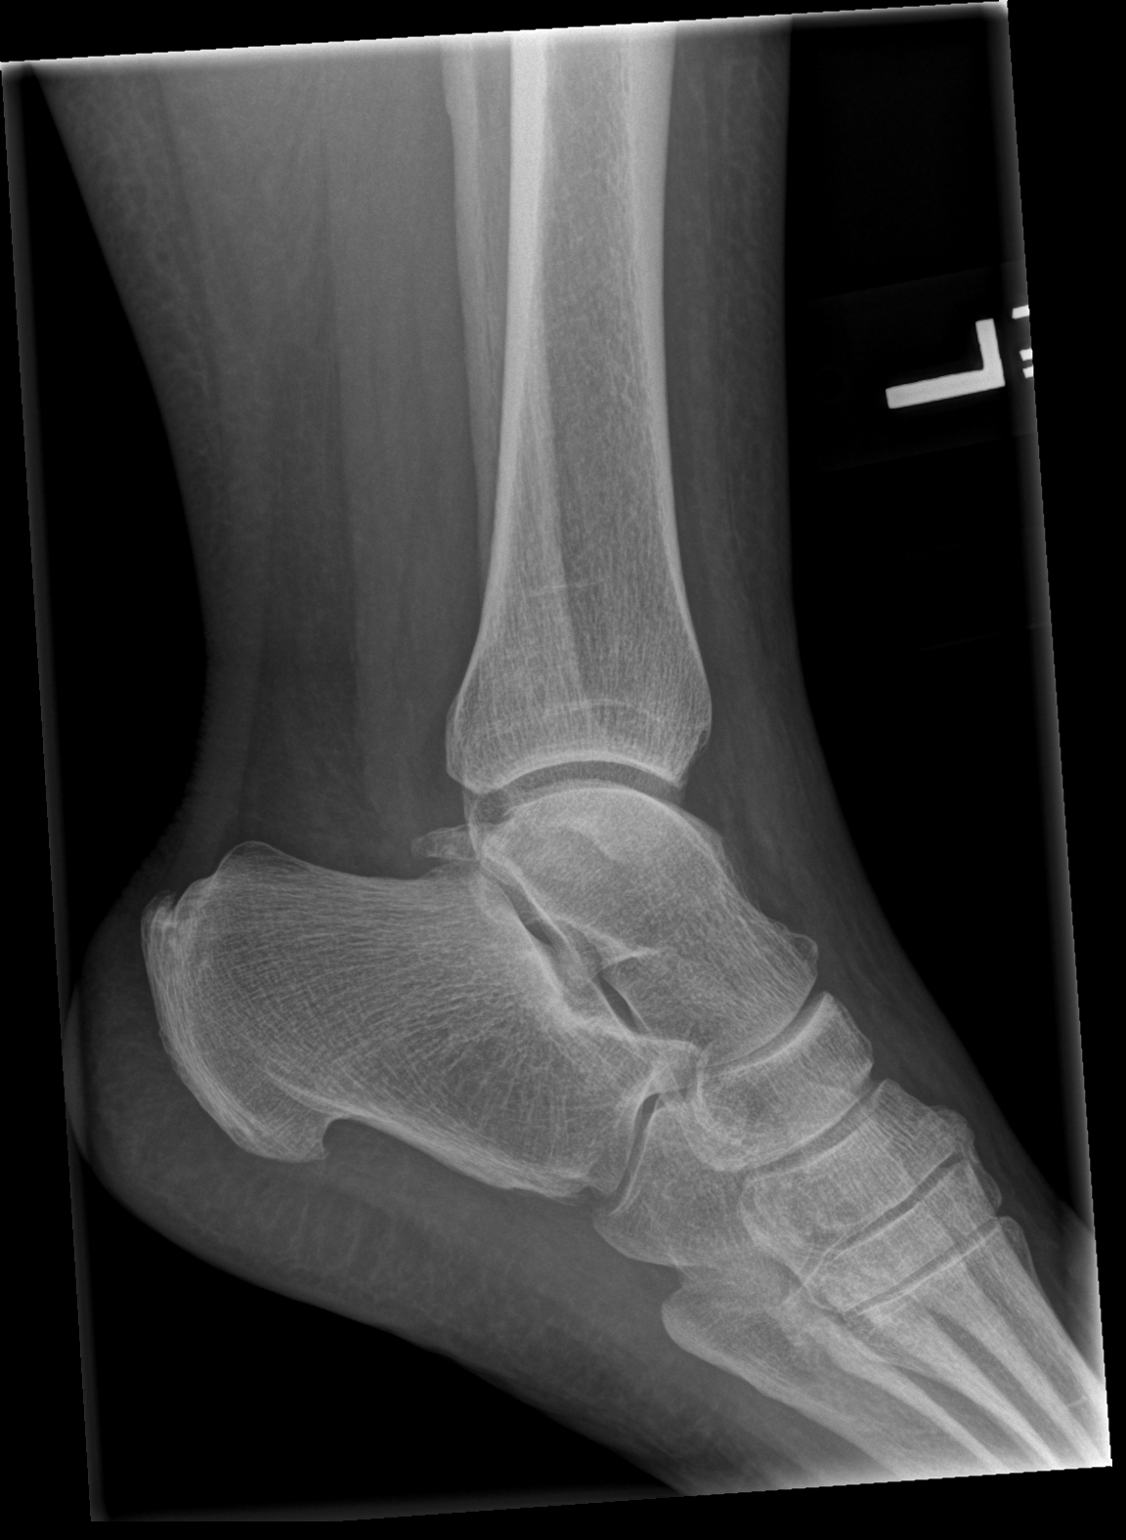

[3 of 3 positions shown; findings below may reference images not displayed]

FINDINGS: No fracture.  No bone lesion.

The ankle joint is normally spaced and aligned. No arthropathic
changes.

Small dorsal plantar calcaneal spurs.

Soft tissues are unremarkable.
IMPRESSION: 1. No fracture, bone lesion or ankle joint abnormality.
2. Small calcaneal spurs.

## 2017-12-30 NOTE — Discharge Instructions (Signed)
Please read and follow all provided instructions.  Your diagnoses today include:  1. Pain of left heel     Tests performed today include:  An x-ray of the affected area - does NOT show any broken bones, shows some heel spurring  Vital signs. See below for your results today.   Medications prescribed:   None  Take any prescribed medications only as directed.  Home care instructions:   Follow any educational materials contained in this packet  Follow R.I.C.E. Protocol:  R - rest your injury   I  - use ice on injury without applying directly to skin  C - compress injury with bandage or splint  E - elevate the injury as much as possible  Follow-up instructions: Please follow-up with your primary care provider or the provided orthopedic physician (bone specialist) if you continue to have significant pain in 1 week.    Return instructions:   Please return if your toes or feet are numb or tingling, appear gray or blue, or you have severe pain (also elevate the leg and loosen splint or wrap if you were given one)  Please return to the Emergency Department if you experience worsening symptoms.   Please return if you have any other emergent concerns.  Additional Information:  Your vital signs today were: BP 130/70 (BP Location: Left Arm)    Pulse 77    Temp 98.2 F (36.8 C) (Oral)    Resp 18    Ht 5\' 3"  (1.6 m)    Wt 106.6 kg (235 lb)    SpO2 95%    BMI 41.63 kg/m  If your blood pressure (BP) was elevated above 135/85 this visit, please have this repeated by your doctor within one month. --------------

## 2017-12-30 NOTE — ED Triage Notes (Signed)
Pt reports having new onset of pain to left ankle that started a week ago. No known injury to ankle and pain worse with pressure.

## 2017-12-30 NOTE — ED Provider Notes (Signed)
[facility] DEPT Provider Note   CSN: [id number] Arrival date & time: 12/30/17  1828     History   Chief Complaint Chief Complaint  Patient presents with  . Ankle Pain    HPI Rhonda Higgins is a 47 y.o. female.  Patient presents with complaint of ankle pain on the left ongoing for 6 days.  She denies injury to the area.  She has taken ibuprofen without relief.  She reports pain of the lateral ankle as well as the posterior foot.  No knee pain.  No other treatments at home other than Ace wrap.  Symptoms are better in the morning and worsen when she is up on it for long periods of time.  The onset of this condition was acute.      Past Medical History:  Diagnosis Date  . Adenocarcinoma (HCC)    endometrial, FIGO GRADE 1  . Allergic rhinitis   . Atypical chest pain    History of  . Depression   . Elevated liver enzymes   . GERD (gastroesophageal reflux disease)   . Hematuria   . History of endometrial cancer 08-02-2009   oncologist-  dr brewster/ [facility] George and dr kinard/  no recurrence   endometrial adenocarinoma Stage 1B, Grade 1, FIGO--  s/p  TAH w/ BSO and pelvic lymph node dissection's and radiation therapy  . History of kidney stones   . History of radiation therapy    2011  pelvic intracavity brachytherapy treatment's for endometrial carcinoma  . History of thyroid nodule    multinodular goiter s/p  total thyroidectomy 11-19-2015  per pathology -  adenomatoid nodules  . Hyperlipidemia   . Hypothyroidism, postsurgical   . 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 Left ureteral stone   . Obesity   . OSA (obstructive sleep apnea)    severe OSA  per study 03-08-2010--  noncomplant cpap  . Overactive bladder   . Personality disorder ([place])   . Polyphagia(783.6)   . PONV (postoperative nausea and vomiting)    after ear surgery only one time  . Right lower quadrant pain   . Urgency of urination   . UTI (urinary tract  infection)     Patient Active Problem List   Diagnosis Date Noted  . Cholelithiasis with chronic cholecystitis 09/06/2017  . Multinodular goiter (nontoxic) 11/17/2015  . Chest pain 07/20/2014  . Diabetes mellitus without complication ([place]) 75/03/2584  . GERD (gastroesophageal reflux disease) 07/19/2014  . Personality disorder ([place])   . Uterine cancer ([place]) 06/22/2011  . DM 02/09/2010  . HLD (hyperlipidemia) 02/09/2010  . Obstructive sleep apnea 02/09/2010  . Allergic rhinitis 02/09/2010  . History of uterine cancer 02/09/2010    Past Surgical History:  Procedure Laterality Date  . CARDIOVASCULAR STRESS TEST  06/09/2008   normal nuclear study w/ no ischemia/  normal LV function and wall motion , ef 83%  . CHOLECYSTECTOMY N/A 09/06/2017   Procedure: LAPAROSCOPIC CHOLECYSTECTOMY WITH INTRAOPERATIVE CHOLANGIOGRAM;  Surgeon: Armandina Gemma, MD;  Location: [facility];  Service: General;  Laterality: N/A;  . CYSTOSCOPY/RETROGRADE/URETEROSCOPY/STONE EXTRACTION WITH BASKET Left 03/08/2016   Procedure: CYSTOSCOPY/RETROGRADE/URETEROSCOPY/STONE EXTRACTION WITH BASKET, STENT PLACEMENT;  Surgeon: Rana Snare, MD;  Location: [facility];  Service: Urology;  Laterality: Left;  . ENDOMETRIAL BIOPSY    . ERCP N/A 09/07/2017   Procedure: ENDOSCOPIC RETROGRADE CHOLANGIOPANCREATOGRAPHY (ERCP);  Surgeon: [name], MD;  Location: [facility];  Service: Gastroenterology;  Laterality: N/A;  .  HOLMIUM LASER APPLICATION Left 2/40/9735   Procedure: HOLMIUM LASER APPLICATION;  Surgeon: Rana Snare, MD;  Location: [facility];  Service: Urology;  Laterality: Left;  . MOUTH SURGERY    . MYRINGECOTMY W/ REMOVAL MIDDLE EAR CHOLESTEATOMA TYPE 1 FASICA TYMPANOPLASTY  09/13/2000  . ROBOTIC ASSISTED TOTAL HYSTERECTOMY WITH BILATERAL SALPINGO OOPHERECTOMY  08-02-2009   at [facility]  dr Denman George   w/  Bilateral pelvic and para aortic lymph node dissection's  . THYROIDECTOMY N/A 11/19/2015   Procedure:  TOTAL THYROIDECTOMY;  Surgeon: Armandina Gemma, MD;  Location: [facility];  Service: General;  Laterality: N/A;  . TONSILLECTOMY  age 16  . TRANSTHORACIC ECHOCARDIOGRAM  07/19/2014   ef 55-60%/  trivial TR  . TYMPANOPLASTY Right 1993     OB History    Gravida  0   Para      Term      Preterm      AB      Living        SAB      TAB      Ectopic      Multiple      Live Births               Home Medications    Prior to Admission medications   Medication Sig Start Date End Date Taking? Authorizing Provider  Ascorbic Acid (VITAMIN C) 1000 MG tablet Take 1,000 mg by mouth daily.    [provider]  aspirin EC 81 MG tablet Take 81 mg by mouth at bedtime.     [provider]  buPROPion (WELLBUTRIN XL) 150 MG 24 hr tablet Take 150 mg by mouth daily.     [provider]  cetirizine (ZYRTEC) 10 MG tablet Take 10 mg by mouth at bedtime.     [provider]  gabapentin (NEURONTIN) 400 MG capsule Take 400-1,200 mg by mouth 3 (three) times daily. Pt takes one capsule in the morning, one in the afternoon, and three at bedtime.    [provider]  glimepiride (AMARYL) 4 MG tablet Take 4 mg by mouth 2 (two) times daily.     [provider]  ibuprofen (ADVIL,MOTRIN) 800 MG tablet Take 800 mg by mouth every 8 (eight) hours as needed for moderate pain.    [provider]  lamoTRIgine (LAMICTAL) 200 MG tablet Take 200 mg by mouth at bedtime.     [provider]  LANTUS SOLOSTAR 100 UNIT/ML Solostar Pen Inject 20 Units into the skin at bedtime. 11/03/16   [provider]  levothyroxine (SYNTHROID, LEVOTHROID) 137 MCG tablet Take 137 mcg by mouth daily before breakfast.     [provider]  lisinopril (PRINIVIL,ZESTRIL) 5 MG tablet Take 5 mg by mouth at bedtime.     [provider]  Multiple Vitamins-Minerals (MULTIVITAMIN WITH MINERALS) tablet Take 1 tablet by mouth every evening.     [provider]  pantoprazole (PROTONIX) 40 MG tablet Take 40 mg by mouth every morning.     [provider]  risperiDONE (RISPERDAL) 2 MG tablet Take 2 mg by mouth daily.  08/22/16   [provider]  simvastatin (ZOCOR) 40 MG tablet Take 40 mg by mouth at bedtime.     [provider]  sitaGLIPtin-metformin (JANUMET) 50-1000 MG tablet Take 1 tablet by mouth 2 (two) times daily with a meal.    [provider]  traMADol (ULTRAM) 50 MG tablet Take 1-2 tablets (50-100 mg  total) by mouth every 6 (six) hours as needed for moderate pain (mild pain, refractory to tylneol). Patient not taking: Reported on 11/14/2017 09/07/17   Armandina Gemma, MD    Family History Family History  Problem Relation Age of Onset  . Heart disease Sister   . Heart attack Brother   . Heart disease Brother   . Heart disease Sister   . Diabetes Sister   . Asthma Mother   . Heart disease Mother   . Diabetes Mother   . Emphysema Mother   . Hypertension Mother   . Stroke Mother   . Prostate cancer Father     Social History Social History   Tobacco Use  . Smoking status: Never Smoker  . Smokeless tobacco: Never Used  Substance Use Topics  . Alcohol use: No  . Drug use: No     Allergies   Hydrocodone; Codeine; and Adhesive [tape]   Review of Systems Review of Systems  Constitutional: Negative for activity change.  Musculoskeletal: Positive for arthralgias and gait problem. Negative for back pain, joint swelling and neck pain.  Skin: Negative for wound.  Neurological: Negative for weakness and numbness.     Physical Exam Updated Vital Signs BP 130/70 (BP Location: Left Arm)   Pulse 77   Temp 98.2 F (36.8 C) (Oral)   Resp 18   Ht 5\' 3"  (1.6 m)   Wt 106.6 kg (235 lb)   SpO2 95%   BMI 41.63 kg/m   Physical Exam  Constitutional: She appears well-developed and well-nourished.  HENT:  Head: Normocephalic and atraumatic.  Eyes: Conjunctivae are normal.  Neck: Normal  range of motion. Neck supple.  Cardiovascular:  Pulses:      Dorsalis pedis pulses are 2+ on the right side, and 2+ on the left side.       Posterior tibial pulses are 2+ on the right side, and 2+ on the left side.  Musculoskeletal: She exhibits tenderness. She exhibits no edema.       Left ankle: She exhibits swelling. She exhibits normal range of motion and no ecchymosis. Tenderness. No lateral malleolus, no medial malleolus and no proximal fibula tenderness found.       Feet:  Neurological: She is alert.  Distal motor, sensation, and vascular intact.   Skin: Skin is warm and dry.  Psychiatric: She has a normal mood and affect.  Nursing note and vitals reviewed.    ED Treatments / Results  Labs (all labs ordered are listed, but only abnormal results are displayed) Labs Reviewed - No data to display  EKG None  Radiology No results found.  Procedures Procedures (including critical care time)  Medications Ordered in ED Medications - No data to display   Initial Impression / Assessment and Plan / ED Course  I have reviewed the triage vital signs and the nursing notes.  Pertinent labs & imaging results that were available during my care of the patient were reviewed by me and considered in my medical decision making (see chart for details).     Patient seen and examined. X-ray pending.   Vital signs reviewed and are as follows: BP 130/70 (BP Location: Left Arm)   Pulse 77   Temp 98.2 F (36.8 C) (Oral)   Resp 18   Ht 5\' 3"  (1.6 m)   Wt 106.6 kg (235 lb)   SpO2 95%   BMI 41.63 kg/m   9:07 PM x-rays negative for fracture.  Does show some bone spurring.  ASO provided by orthopedic tech.  Discussed results with patient.  She will follow-up with orthopedist if needed.  Will continue NSAIDs, conservative measures.  Final Clinical Impressions(s) / ED Diagnoses   Final diagnoses:  Pain of left heel   Patient with left heel pain.  X-rays do not show any fractures.   She does have some spurring which may be contributing to her symptoms.  Conservative measures indicated at this point with orthopedic follow-up if not improving.  Foot is neurovascularly intact.  ED Discharge Orders    None       Suann Larry 12/30/17 2108    Carmin Muskrat, MD 12/30/17 2352

## 2018-01-03 ENCOUNTER — Ambulatory Visit
Admission: RE | Admit: 2018-01-03 | Discharge: 2018-01-03 | Disposition: A | Discharge status: Home / Self Care | Payer: Medicaid Other | Source: Ambulatory Visit | Attending: Gastroenterology | Admitting: Gastroenterology

## 2018-01-03 DIAGNOSIS — R1030 Lower abdominal pain, unspecified: Secondary | ICD-10-CM

## 2018-01-03 IMAGING — CT CT ABD-PELV W/ CM
2 of 5 series · 15 of 46 positions shown, 17 images · IV contrast (iopamidol)
Comparison: ERCP [DATE]. Right upper quadrant ultrasound
[DATE].

CLINICAL DATA: Lower abdominal pain.

Creatinine was obtained on site at [HOSPITAL] at [REDACTED].
Results: Creatinine 0.5 mg/dL.
EXAM:
CT ABDOMEN AND PELVIS WITH CONTRAST
TECHNIQUE: Multidetector CT imaging of the abdomen and pelvis was performed
using the standard protocol following bolus administration of
intravenous contrast.
CONTRAST:  125mL [WQ] IOPAMIDOL ([WQ]) INJECTION 61%

[Series 2: abd pelvis 5.00 br40 s3 ax · axial · 0.86mm/px · z∈[+1023,+1423]mm · 12 of 92 slices shown, 14 images]
[im 6/92  soft-tissue]
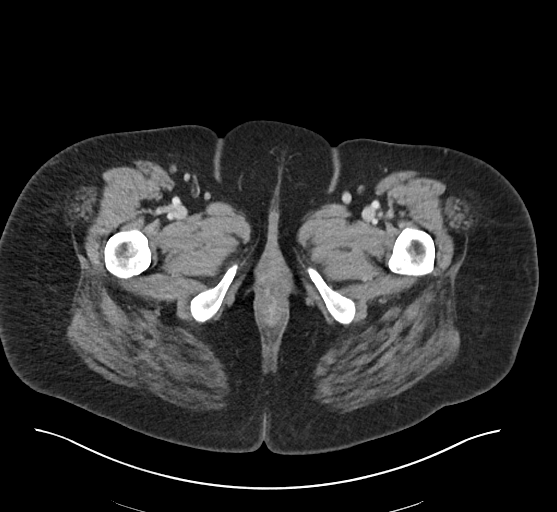
[im 6/92  bone]
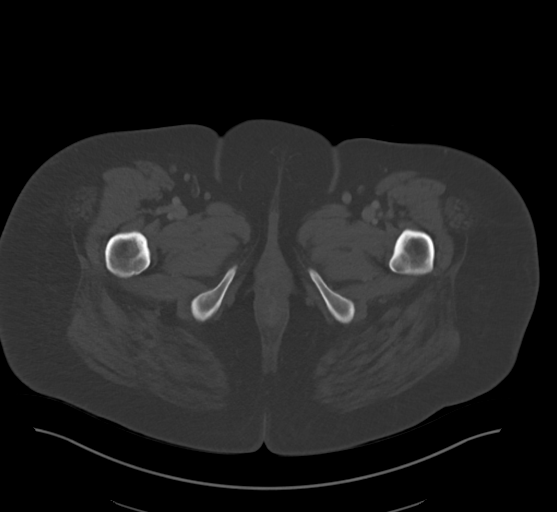
[im 17/92  soft-tissue]
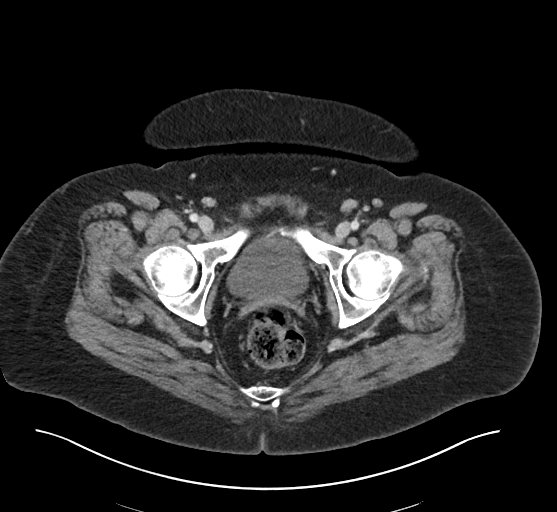
[im 22/92  soft-tissue]
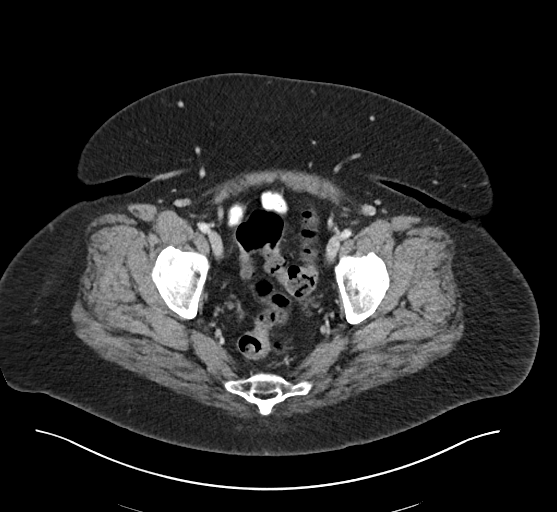
[im 27/92  soft-tissue]
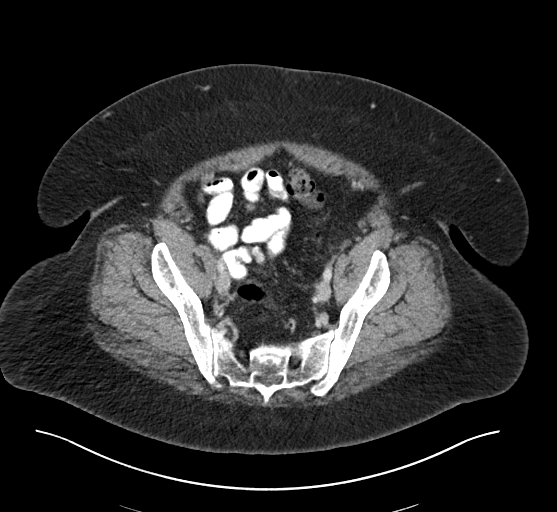
[im 38/92  soft-tissue]
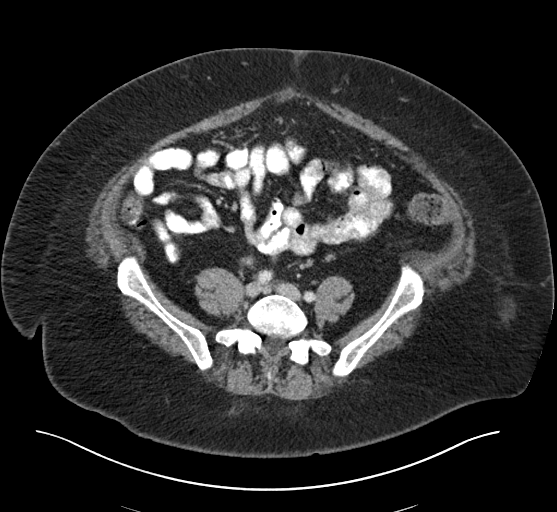
[im 43/92  soft-tissue]
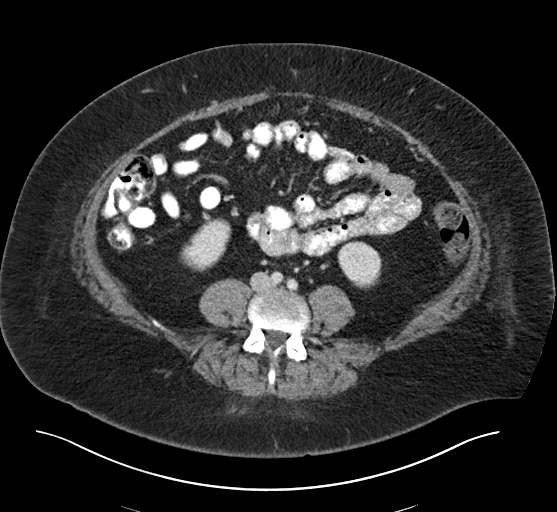
[im 49/92  soft-tissue]
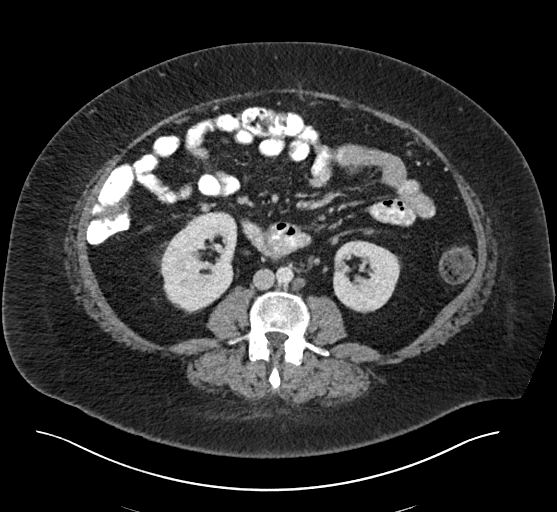
[im 59/92  soft-tissue]
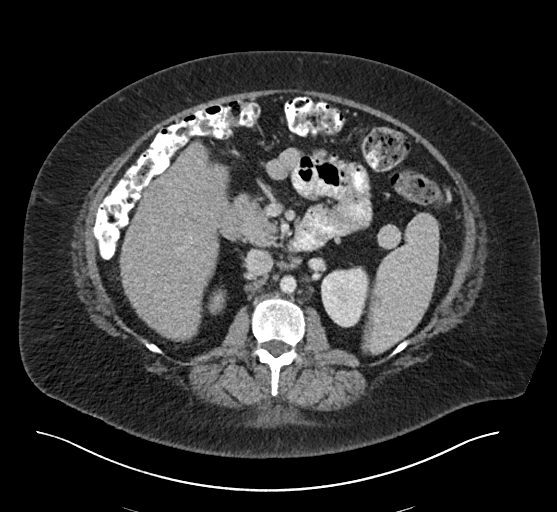
[im 65/92  soft-tissue]
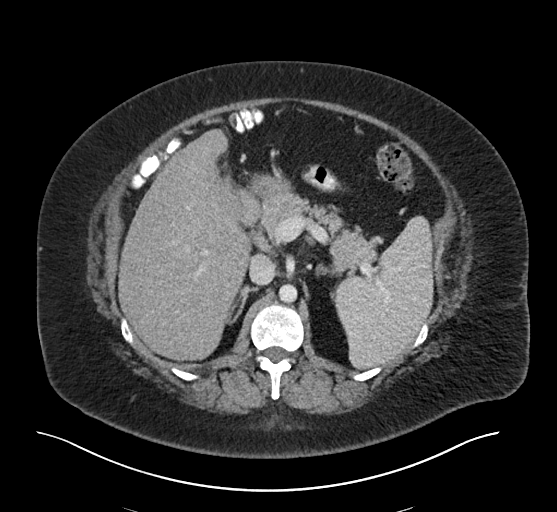
[im 65/92  bone]
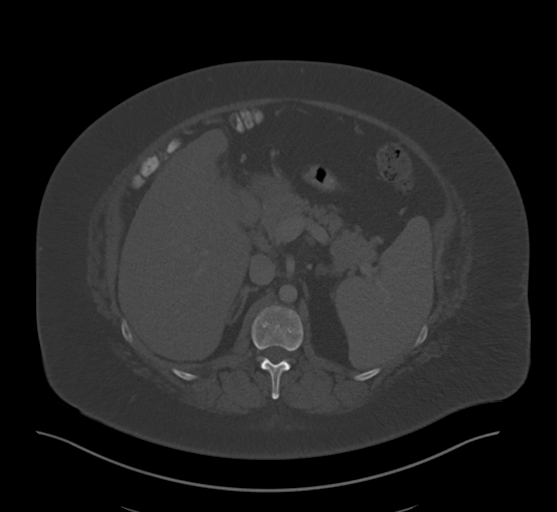
[im 70/92  soft-tissue]
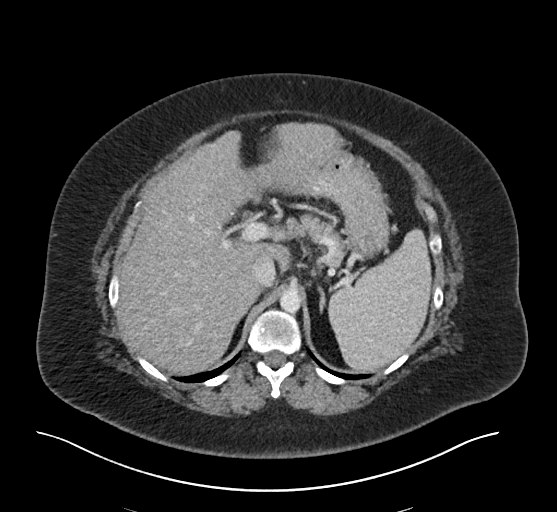
[im 81/92  soft-tissue]
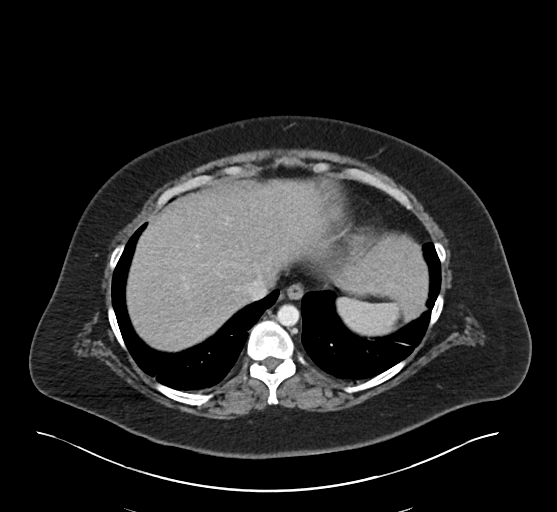
[im 86/92  soft-tissue]
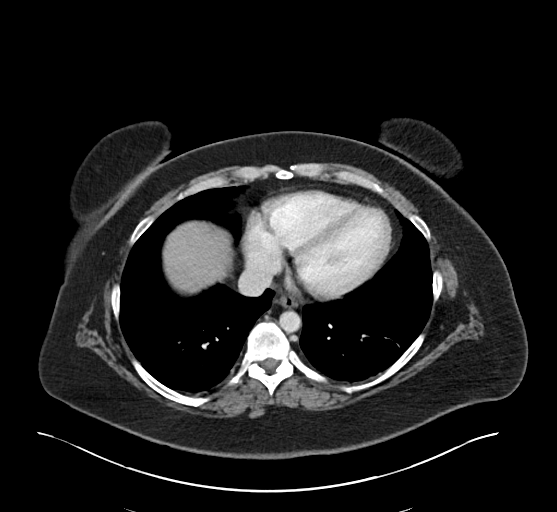

[Series 6: abd pelvis 2.00 br40 s3 cor · coronal · 0.91mm/px · 3 of 204 slices shown]
[im 68/204  soft-tissue]
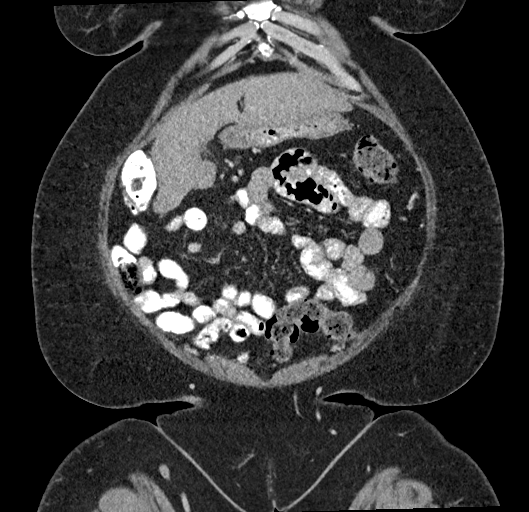
[im 91/204  soft-tissue]
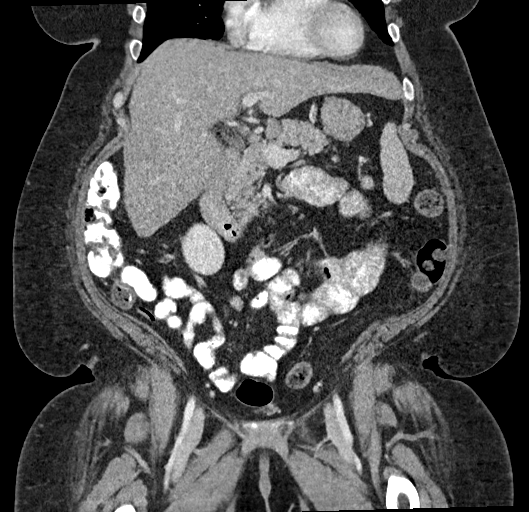
[im 113/204  soft-tissue]
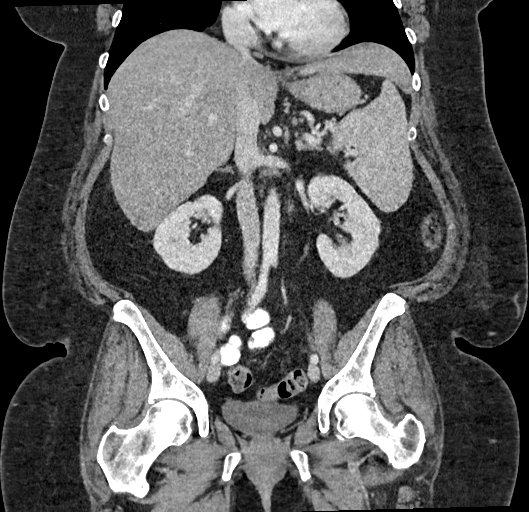

[15 of 46 positions shown; findings below may reference images not displayed]

FINDINGS: Lower chest: The lung bases are clear without focal nodule, mass, or
airspace disease. Minimal atelectasis is present. Heart size is
normal. No significant pleural or pericardial effusion is present.

Hepatobiliary: There is mild fatty infiltration of the liver. No
discrete lesions are present. Patient is status post
cholecystectomy. Common bile duct is within normal limits.

Pancreas: Unremarkable. No pancreatic ductal dilatation or
surrounding inflammatory changes.

Spleen: Spleen is mildly enlarged, measuring 14 cm cephalo caudad.
Splenule is noted. No discrete lesions are evident.

Adrenals/Urinary Tract: Adrenal glands are normal bilaterally. A 7
mm lower pole right kidney lesion likely represents a cyst but is
too small to formally characterize. No other focal lesions are
present. There is no stone or obstruction. Ureters are within normal
limits. The urinary bladder is within.

Stomach/Bowel: Stomach and duodenum are unremarkable. Small is
within normal limits. Terminal ileum is normal. Appendix is
visualized and within limits. Ascending and transverse colon are
normal. Descending and sigmoid colon are unremarkable.

Vascular/Lymphatic: No significant vascular findings are present. No
enlarged abdominal or pelvic lymph nodes.

Reproductive: Status post hysterectomy. No adnexal masses.

Other: No abdominal wall hernia or abnormality. A paraumbilical
hernia contains fat but no bowel. No other focal hernias are
present. There is no significant free fluid.

Musculoskeletal: Vertebral body heights alignment are normal.
Transitional S1 segment is noted. Focal lytic or blastic lesions are
present. The pelvis is intact. Hips are located and within.
IMPRESSION: 1. Acute or focal lesion to explain the patient's lower abdominal
pain.
2. Cholecystectomy without complication.
3. Hysterectomy.
4. Paraumbilical hernia without associated bowel.
5. Borderline splenomegaly.
6. Hepatic steatosis.

## 2018-01-03 MED ORDER — IOPAMIDOL (ISOVUE-300) INJECTION 61%
125.0000 mL | Freq: Once | INTRAVENOUS | Status: AC | PRN
  Administered 2018-01-03: 125 mL via INTRAVENOUS

## 2018-01-18 ENCOUNTER — Emergency Department (HOSPITAL_COMMUNITY)
Admission: EM | Admit: 2018-01-18 | Discharge: 2018-01-18 | Disposition: A | Discharge status: Home / Self Care | Payer: Medicaid Other | Attending: Emergency Medicine | Admitting: Emergency Medicine

## 2018-01-18 ENCOUNTER — Other Ambulatory Visit: Payer: Self-pay

## 2018-01-18 ENCOUNTER — Emergency Department (HOSPITAL_COMMUNITY): Payer: Medicaid Other

## 2018-01-18 ENCOUNTER — Encounter (HOSPITAL_COMMUNITY): Payer: Self-pay

## 2018-01-18 DIAGNOSIS — E119 Type 2 diabetes mellitus without complications: Secondary | ICD-10-CM | POA: Insufficient documentation

## 2018-01-18 DIAGNOSIS — E039 Hypothyroidism, unspecified: Secondary | ICD-10-CM | POA: Diagnosis not present

## 2018-01-18 DIAGNOSIS — Z8542 Personal history of malignant neoplasm of other parts of uterus: Secondary | ICD-10-CM | POA: Diagnosis not present

## 2018-01-18 DIAGNOSIS — K389 Disease of appendix, unspecified: Secondary | ICD-10-CM | POA: Diagnosis not present

## 2018-01-18 DIAGNOSIS — R110 Nausea: Secondary | ICD-10-CM | POA: Diagnosis not present

## 2018-01-18 DIAGNOSIS — R945 Abnormal results of liver function studies: Secondary | ICD-10-CM | POA: Insufficient documentation

## 2018-01-18 DIAGNOSIS — R109 Unspecified abdominal pain: Secondary | ICD-10-CM | POA: Diagnosis present

## 2018-01-18 DIAGNOSIS — N200 Calculus of kidney: Secondary | ICD-10-CM | POA: Insufficient documentation

## 2018-01-18 DIAGNOSIS — Z7982 Long term (current) use of aspirin: Secondary | ICD-10-CM | POA: Insufficient documentation

## 2018-01-18 DIAGNOSIS — Z79899 Other long term (current) drug therapy: Secondary | ICD-10-CM | POA: Diagnosis not present

## 2018-01-18 DIAGNOSIS — R7989 Other specified abnormal findings of blood chemistry: Secondary | ICD-10-CM

## 2018-01-18 DIAGNOSIS — Z794 Long term (current) use of insulin: Secondary | ICD-10-CM | POA: Diagnosis not present

## 2018-01-18 LAB — URINALYSIS, ROUTINE W REFLEX MICROSCOPIC
Bilirubin Urine: NEGATIVE
Glucose, UA: 50 mg/dL — AB
Hgb urine dipstick: NEGATIVE
Ketones, ur: NEGATIVE mg/dL
Nitrite: NEGATIVE
Protein, ur: NEGATIVE mg/dL
Specific Gravity, Urine: 1.02 (ref 1.005–1.030)
pH: 6 (ref 5.0–8.0)

## 2018-01-18 LAB — COMPREHENSIVE METABOLIC PANEL
ALT: 101 U/L — ABNORMAL HIGH (ref 0–44)
AST: 97 U/L — ABNORMAL HIGH (ref 15–41)
Albumin: 3.8 g/dL (ref 3.5–5.0)
Alkaline Phosphatase: 88 U/L (ref 38–126)
Anion gap: 10 (ref 5–15)
BUN: 14 mg/dL (ref 6–20)
CO2: 28 mmol/L (ref 22–32)
Calcium: 9 mg/dL (ref 8.9–10.3)
Chloride: 104 mmol/L (ref 98–111)
Creatinine, Ser: 0.61 mg/dL (ref 0.44–1.00)
GFR calc Af Amer: >60 mL/min (ref >60)
GFR calc non Af Amer: >60 mL/min (ref >60)
Glucose, Bld: 131 mg/dL — ABNORMAL HIGH (ref 70–99)
Potassium: 3.9 mmol/L (ref 3.5–5.1)
Sodium: 142 mmol/L (ref 135–145)
Total Bilirubin: 0.7 mg/dL (ref 0.3–1.2)
Total Protein: 6.8 g/dL (ref 6.5–8.1)

## 2018-01-18 LAB — CBC WITH DIFFERENTIAL/PLATELET
Basophils Absolute: 0 10*3/uL (ref 0.0–0.1)
Basophils Relative: 0 %
Eosinophils Absolute: 0.2 10*3/uL (ref 0.0–0.7)
Eosinophils Relative: 3 %
HCT: 43 % (ref 36.0–46.0)
Hemoglobin: 14 g/dL (ref 12.0–15.0)
Lymphocytes Relative: 31 %
Lymphs Abs: 1.6 10*3/uL (ref 0.7–4.0)
MCH: 27.1 pg (ref 26.0–34.0)
MCHC: 32.6 g/dL (ref 30.0–36.0)
MCV: 83.3 fL (ref 78.0–100.0)
Monocytes Absolute: 0.4 10*3/uL (ref 0.1–1.0)
Monocytes Relative: 7 %
Neutro Abs: 3.1 10*3/uL (ref 1.7–7.7)
Neutrophils Relative %: 59 %
Platelets: 108 10*3/uL — ABNORMAL LOW (ref 150–400)
RBC: 5.16 MIL/uL — ABNORMAL HIGH (ref 3.87–5.11)
RDW: 15.3 % (ref 11.5–15.5)
WBC: 5.3 10*3/uL (ref 4.0–10.5)

## 2018-01-18 IMAGING — CT CT RENAL STONE PROTOCOL
2 of 4 series · 16 of 46 positions shown, 18 images · non-contrast
Comparison: [DATE].

CLINICAL DATA: Right flank pain for the past 2 days. History of
nephrolithiasis.

EXAM:
CT ABDOMEN AND PELVIS WITHOUT CONTRAST
TECHNIQUE: Multidetector CT imaging of the abdomen and pelvis was performed
following the standard protocol without IV contrast.

[Series 2: axial st · axial · 0.90mm/px · z∈[-583,-183]mm · 13 of 92 slices shown, 15 images]
[im 6/92  soft-tissue]
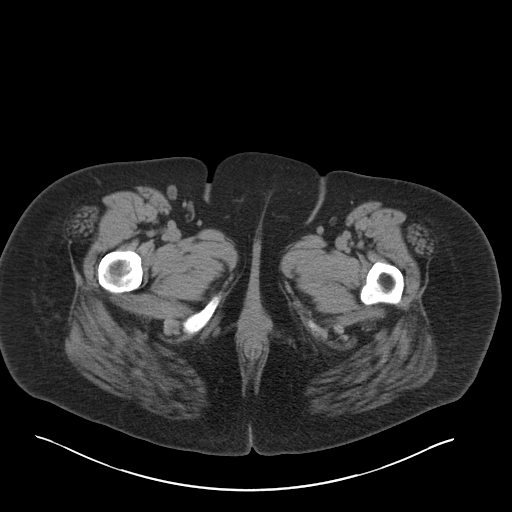
[im 6/92  bone]
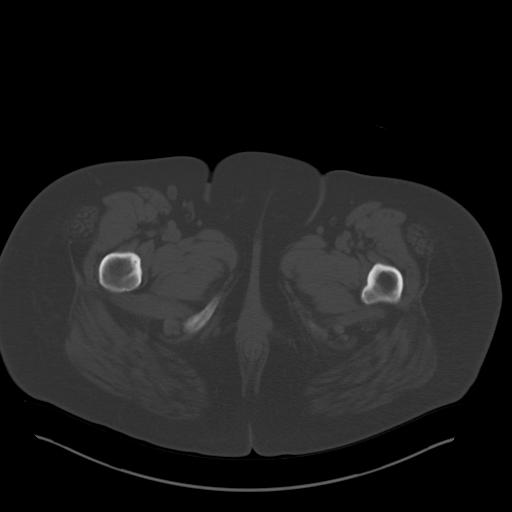
[im 11/92  soft-tissue]
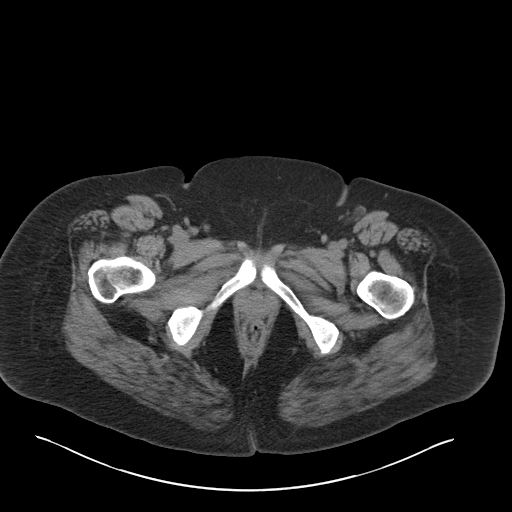
[im 22/92  soft-tissue]
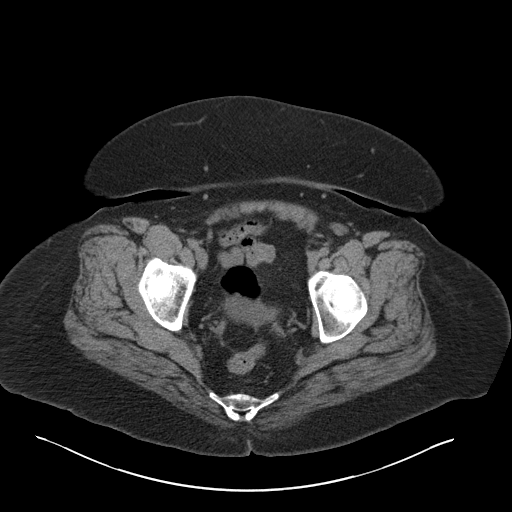
[im 27/92  soft-tissue]
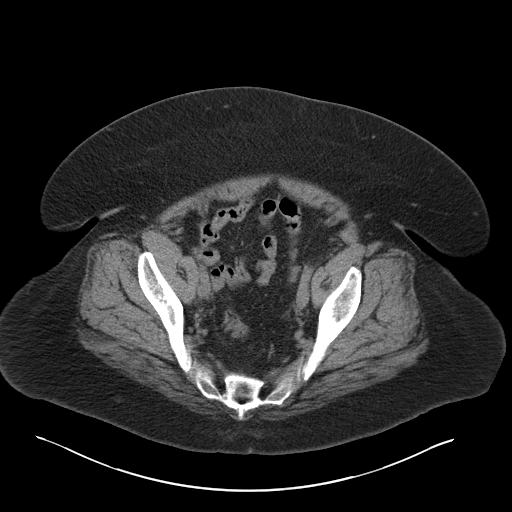
[im 33/92  soft-tissue]
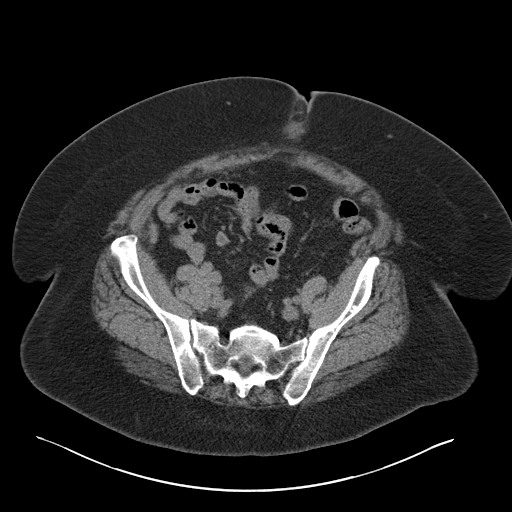
[im 38/92  soft-tissue]
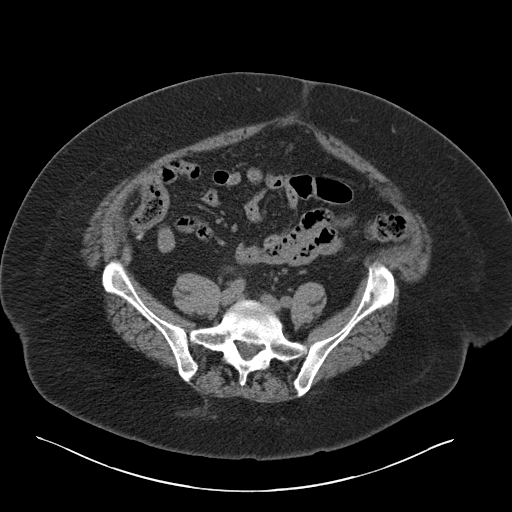
[im 49/92  soft-tissue]
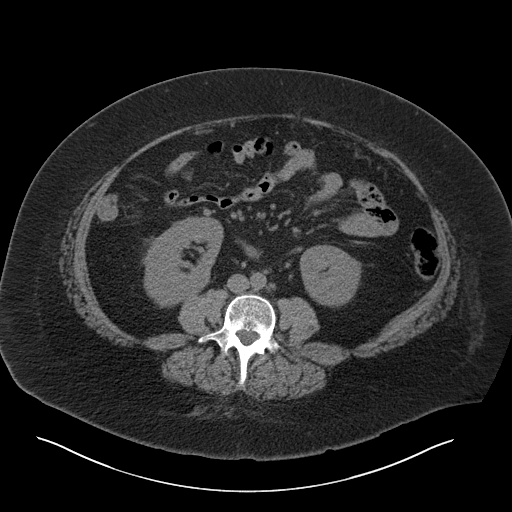
[im 54/92  soft-tissue]
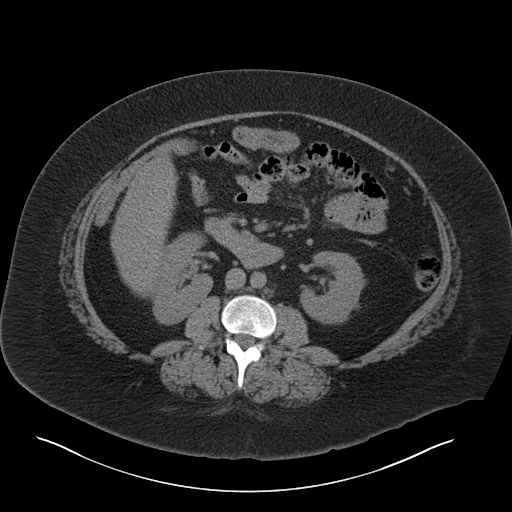
[im 59/92  soft-tissue]
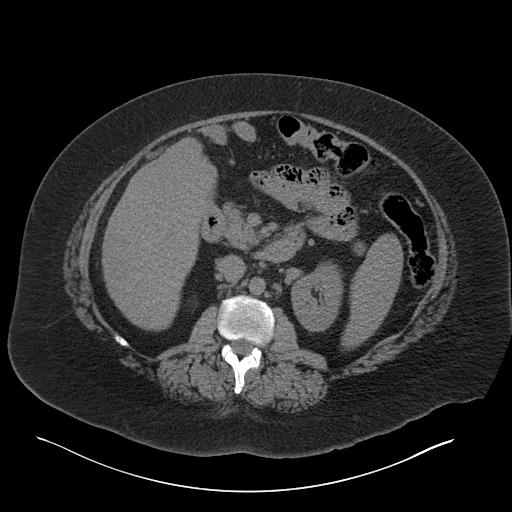
[im 59/92  bone]
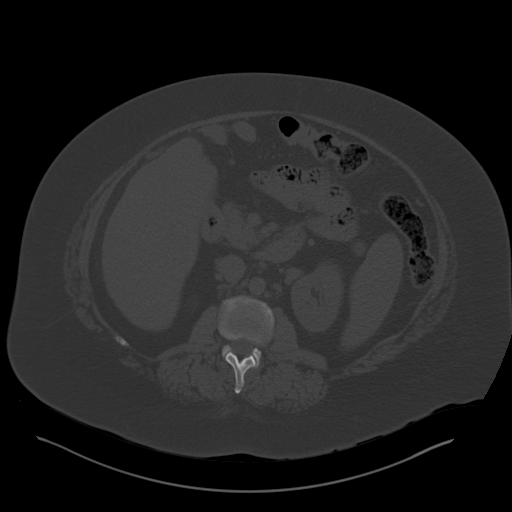
[im 65/92  soft-tissue]
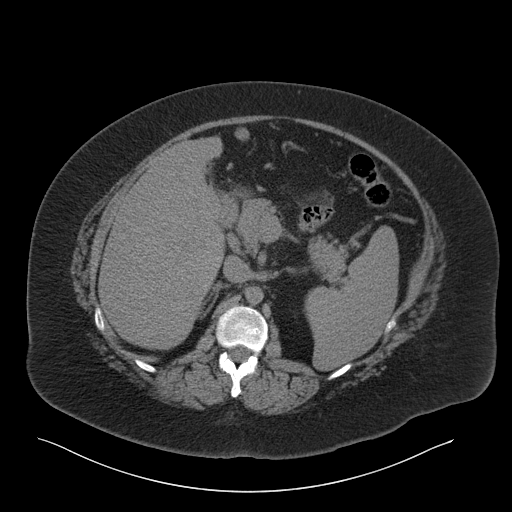
[im 70/92  soft-tissue]
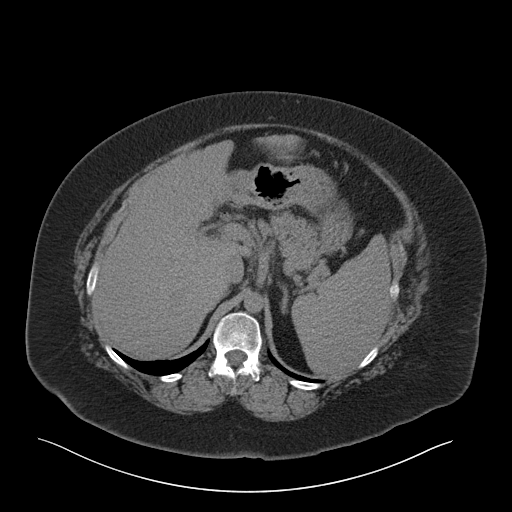
[im 81/92  soft-tissue]
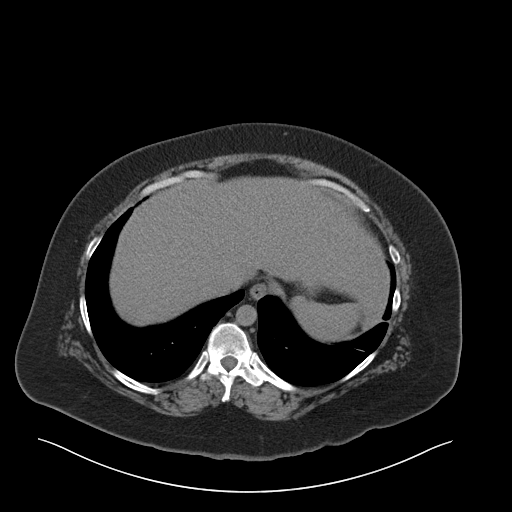
[im 86/92  soft-tissue]
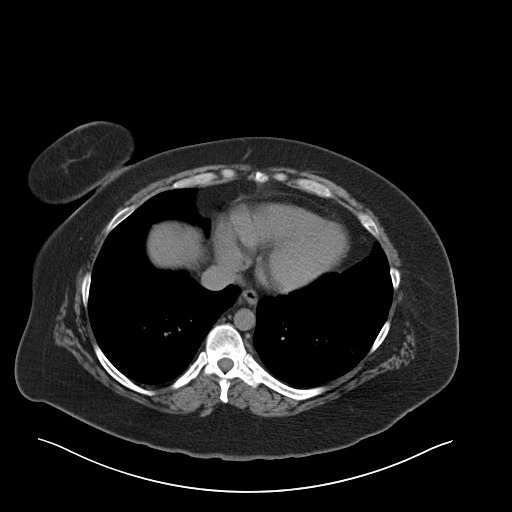

[Series 5: coronal · coronal · 0.81mm/px · 3 of 186 slices shown]
[im 62/186  soft-tissue]
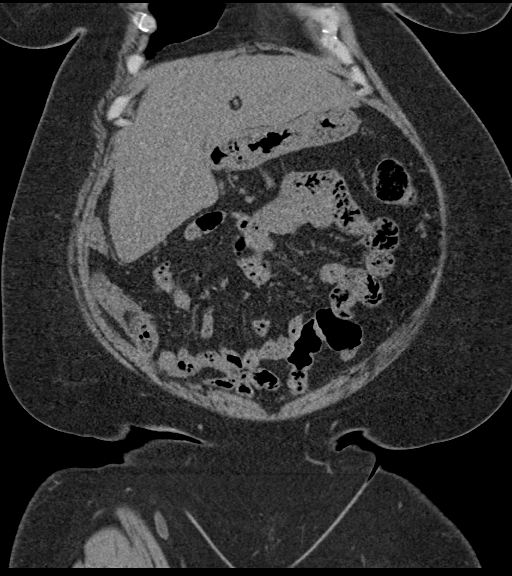
[im 83/186  soft-tissue]
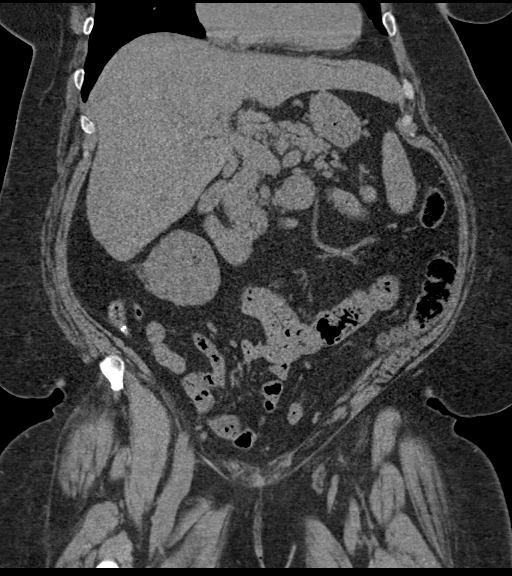
[im 103/186  soft-tissue]
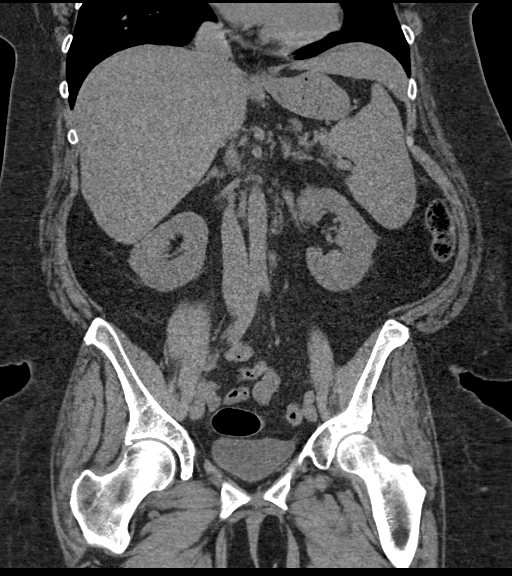

[16 of 46 positions shown; findings below may reference images not displayed]

FINDINGS: Lower chest: Stable mild linear scarring at both lung bases. Small
right lower lobe calcified granuloma.

Hepatobiliary: No focal liver abnormality is seen. Status post
cholecystectomy. No biliary dilatation.

Pancreas: Unremarkable. No pancreatic ductal dilatation or
surrounding inflammatory changes.

Spleen: Normal in size without focal abnormality.

Adrenals/Urinary Tract: Normal appearing adrenal glands. Tiny mid
left renal calculus. Interval better visualized 3 mm calcification
at the inferior aspect of the left urinary bladder. This is non
dependent. Normal appearing right kidney and ureters.

Stomach/Bowel: 6 mm proximal appendiceal appendicoliths. Otherwise,
normal appearing appendix. Normal appearing stomach, small bowel and
colon.

Vascular/Lymphatic: No significant vascular findings are present. No
enlarged abdominal or pelvic lymph nodes.

Reproductive: Status post hysterectomy. No adnexal masses.

Other: Small umbilical hernia containing fat.

Musculoskeletal: Mild lumbar and lower thoracic spine degenerative
changes.
IMPRESSION: 1. No acute abnormality.
2. 6 mm appendicolith without evidence of appendicitis.
3. 3 mm calcification at the inferior aspect of the urinary bladder.
This is in a non dependent and location and may be in the wall of
the bladder. This could also represent a tiny adherent calculus.
4. Tiny, nonobstructing mid left renal calculus.

## 2018-01-18 MED ORDER — ONDANSETRON 4 MG PO TBDP: 4.0000 mg | ORAL_TABLET | Freq: Three times a day (TID) | ORAL | 0 refills | Status: DC | PRN

## 2018-01-18 MED ORDER — SODIUM CHLORIDE 0.9 % IV BOLUS
1000.0000 mL | Freq: Once | INTRAVENOUS | Status: AC
Start: 2018-01-18 — End: 2018-01-18
  Administered 2018-01-18: 1000 mL via INTRAVENOUS

## 2018-01-18 NOTE — ED Notes (Signed)
Patient states she has a hx of kidney stones and she feels like this is what the pain is. The patient complaining of right flank pain. Patient states that if you touch it, she hurts. She also states that if she moves a certain way it also hurts. No other complaints.

## 2018-01-18 NOTE — Discharge Instructions (Addendum)
Your CT scan showed a small stone in your bladder, which could potentially be from a recently passed kidney stone; this should pass soon if it is a kidney/bladder stone, and your symptoms should improve. Alternate between tylenol and ibuprofen as needed for pain. Use zofran as directed as needed for nausea. Stay well hydrated. You may consider using heat to the area of pain, using warm compress no longer than 20 minutes per hour. Follow-up with the urologist in the next 1 week for recheck of ongoing pain, however for intractable or uncontrollable symptoms at home then return to the [facility] emergency department.   Your CT scan also showed a small collection in your appendix called an appendicolith; this is unlikely the cause of your symptoms today, your CT confirms that you don't have appendicitis, however with this finding it's possible that you are at higher likelihood of developing appendicitis. You should follow up with the surgeon in the next 1 week for recheck and to discuss potentially having your appendix removed. If you develop worsening symptoms such as worsening right lower abdominal pain, nausea/vomiting, fevers, or any other worsening symptoms, then return to the ER immediately for repeat evaluation.

## 2018-01-18 NOTE — ED Triage Notes (Signed)
Pt states right sided flank pain for 2 days. No pain with urination.

## 2018-01-18 NOTE — ED Provider Notes (Signed)
[facility] DEPT Provider Note   CSN: [id number] Arrival date & time: 01/18/18  1615     History   Chief Complaint Chief Complaint  Patient presents with  . Flank Pain    HPI Rhonda Higgins is a 47 y.o. female with a PMHx of endometrial adenocarcinoma s/p complete hysterectomy, GERD, nephrolithiasis, hypothyroidism, HLD, DM2, overactive bladder, chronic back pain/DDD, fatty liver/cirrhosis, splenomegaly, and other conditions listed below, with PSHx of hysterectomy, cholecystectomy, and prior kidney stone removal, who presents to the ED with complaints of right flank pain that began 2 days ago.  Patient states that it feels somewhat similar to prior kidney stones however less severe, so she is not sure whether to kidney stone or whether it is due to her "bad back".  She describes the pain as 6/10 intermittent dull and throbbing right flank pain that radiates into the right lateral abdomen/RLQ, worse with activity or sitting on hard surfaces, and minimally improved with Motrin.  She mentions that she has chronic nausea which is unchanged, and is unrelated to her current flank pain.  She was recently told that she has "cirrhosis and a fatty liver and an enlarged spleen" based on a CT that was done on 01/03/2018.  Notably, chart review reveals that the CT also found a 7 mm lower pole right kidney lesion that could be a cyst, however this was not mentioned to her.  Her urologist is Dr. Risa Grill.  She denies any recent heavy lifting or bending or anything that would have caused her back pain.  She denies fevers, chills, CP, SOB, new/worsening nausea/vomiting, diarrhea/constipation, obstipation, melena, hematochezia, hematuria, dysuria, worsening urinary frequency, incontinence of urine/stool, vaginal bleeding/discharge, saddle anesthesia/cauda equina symptoms, myalgias, arthralgias, numbness, tingling, focal weakness, or any other complaints at this time.   The history is  provided by the patient and medical records. No language interpreter was used.  Flank Pain  Associated symptoms include abdominal pain. Pertinent negatives include no chest pain and no shortness of breath.    Past Medical History:  Diagnosis Date  . Adenocarcinoma (HCC)    endometrial, FIGO GRADE 1  . Allergic rhinitis   . Atypical chest pain    History of  . Depression   . Elevated liver enzymes   . GERD (gastroesophageal reflux disease)   . Hematuria   . History of endometrial cancer 08-02-2009   oncologist-  dr brewster/ Denman George and dr kinard/  no recurrence   endometrial adenocarinoma Stage 1B, Grade 1, FIGO--  s/p  TAH w/ BSO and pelvic lymph node dissection's and radiation therapy  . History of kidney stones   . History of radiation therapy    2011  pelvic intracavity brachytherapy treatment's for endometrial carcinoma  . History of thyroid nodule    multinodular goiter s/p  total thyroidectomy 11-19-2015  per pathology -  adenomatoid nodules  . Hyperlipidemia   . Hypothyroidism, postsurgical   . Insulin dependent diabetes mellitus ([place])    Type 2  . Left ureteral stone   . Obesity   . OSA (obstructive sleep apnea)    severe OSA  per study 03-08-2010--  noncomplant cpap  . Overactive bladder   . Personality disorder ([place])   . Polyphagia(783.6)   . PONV (postoperative nausea and vomiting)    after ear surgery only one time  . Right lower quadrant pain   . Urgency of urination   . UTI (urinary tract infection)     Patient Active Problem List  Diagnosis Date Noted  . Cholelithiasis with chronic cholecystitis 09/06/2017  . Multinodular goiter (nontoxic) 11/17/2015  . Chest pain 07/20/2014  . Diabetes mellitus without complication ([place]) 40/98/1191  . GERD (gastroesophageal reflux disease) 07/19/2014  . Personality disorder ([place])   . Uterine cancer ([place]) 06/22/2011  . DM 02/09/2010  . HLD (hyperlipidemia) 02/09/2010  . Obstructive sleep apnea 02/09/2010  . Allergic  rhinitis 02/09/2010  . History of uterine cancer 02/09/2010    Past Surgical History:  Procedure Laterality Date  . CARDIOVASCULAR STRESS TEST  06/09/2008   normal nuclear study w/ no ischemia/  normal LV function and wall motion , ef 83%  . CHOLECYSTECTOMY N/A 09/06/2017   Procedure: LAPAROSCOPIC CHOLECYSTECTOMY WITH INTRAOPERATIVE CHOLANGIOGRAM;  Surgeon: Armandina Gemma, MD;  Location: [facility];  Service: General;  Laterality: N/A;  . CYSTOSCOPY/RETROGRADE/URETEROSCOPY/STONE EXTRACTION WITH BASKET Left 03/08/2016   Procedure: CYSTOSCOPY/RETROGRADE/URETEROSCOPY/STONE EXTRACTION WITH BASKET, STENT PLACEMENT;  Surgeon: Rana Snare, MD;  Location[facility];  Service: Urology;  Laterality: Left;  . ENDOMETRIAL BIOPSY    . ERCP N/A 09/07/2017   Procedure: ENDOSCOPIC RETROGRADE CHOLANGIOPANCREATOGRAPHY (ERCP);  Surgeon: [name], MD;  Location: [facility];  Service: Gastroenterology;  Laterality: N/A;  . HOLMIUM LASER APPLICATION Left 4/78/2956   Procedure: HOLMIUM LASER APPLICATION;  Surgeon: Rana Snare, MD;  Location: [facility];  Service: Urology;  Laterality: Left;  . MOUTH SURGERY    . MYRINGECOTMY W/ REMOVAL MIDDLE EAR CHOLESTEATOMA TYPE 1 FASICA TYMPANOPLASTY  09/13/2000  . ROBOTIC ASSISTED TOTAL HYSTERECTOMY WITH BILATERAL SALPINGO OOPHERECTOMY  08-02-2009   at [facility]  dr Denman George   w/  Bilateral pelvic and para aortic lymph node dissection's  . THYROIDECTOMY N/A 11/19/2015   Procedure: TOTAL THYROIDECTOMY;  Surgeon: Armandina Gemma, MD;  Location: [facility];  Service: General;  Laterality: N/A;  . TONSILLECTOMY  age 28  . TRANSTHORACIC ECHOCARDIOGRAM  07/19/2014   ef 55-60%/  trivial TR  . TYMPANOPLASTY Right 1993     OB History    Gravida  0   Para      Term      Preterm      AB      Living        SAB      TAB      Ectopic      Multiple      Live Births               Home Medications    Prior to Admission medications   Medication  Sig Start Date End Date Taking? Authorizing Provider  Ascorbic Acid (VITAMIN C) 1000 MG tablet Take 1,000 mg by mouth daily.    [provider]  aspirin EC 81 MG tablet Take 81 mg by mouth at bedtime.     [provider]  buPROPion (WELLBUTRIN XL) 150 MG 24 hr tablet Take 150 mg by mouth daily.     [provider]  cetirizine (ZYRTEC) 10 MG tablet Take 10 mg by mouth at bedtime.     [provider]  gabapentin (NEURONTIN) 400 MG capsule Take 400-1,200 mg by mouth 3 (three) times daily. Pt takes one capsule in the morning, one in the afternoon, and three at bedtime.    [provider]  glimepiride (AMARYL) 4 MG tablet Take 4 mg by mouth 2 (two) times daily.     [provider]  ibuprofen (ADVIL,MOTRIN) 800 MG tablet Take 800 mg by mouth every 8 (eight) hours as needed  for moderate pain.    [provider]  lamoTRIgine (LAMICTAL) 200 MG tablet Take 200 mg by mouth at bedtime.     [provider]  LANTUS SOLOSTAR 100 UNIT/ML Solostar Pen Inject 20 Units into the skin at bedtime. 11/03/16   [provider]  levothyroxine (SYNTHROID, LEVOTHROID) 137 MCG tablet Take 137 mcg by mouth daily before breakfast.     [provider]  lisinopril (PRINIVIL,ZESTRIL) 5 MG tablet Take 5 mg by mouth at bedtime.     [provider]  Multiple Vitamins-Minerals (MULTIVITAMIN WITH MINERALS) tablet Take 1 tablet by mouth every evening.     [provider]  pantoprazole (PROTONIX) 40 MG tablet Take 40 mg by mouth every morning.     [provider]  risperiDONE (RISPERDAL) 2 MG tablet Take 2 mg by mouth daily.  08/22/16   [provider]  simvastatin (ZOCOR) 40 MG tablet Take 40 mg by mouth at bedtime.     [provider]  sitaGLIPtin-metformin (JANUMET) 50-1000 MG tablet Take 1 tablet by mouth 2 (two) times daily with a meal.    [provider]  traMADol (ULTRAM) 50 MG tablet Take 1-2  tablets (50-100 mg total) by mouth every 6 (six) hours as needed for moderate pain (mild pain, refractory to tylneol). Patient not taking: Reported on 11/14/2017 09/07/17   Armandina Gemma, MD    Family History Family History  Problem Relation Age of Onset  . Heart disease Sister   . Heart attack Brother   . Heart disease Brother   . Heart disease Sister   . Diabetes Sister   . Asthma Mother   . Heart disease Mother   . Diabetes Mother   . Emphysema Mother   . Hypertension Mother   . Stroke Mother   . Prostate cancer Father     Social History Social History   Tobacco Use  . Smoking status: Never Smoker  . Smokeless tobacco: Never Used  Substance Use Topics  . Alcohol use: No  . Drug use: No     Allergies   Hydrocodone; Codeine; and Adhesive [tape]   Review of Systems Review of Systems  Constitutional: Negative for chills and fever.  Respiratory: Negative for shortness of breath.   Cardiovascular: Negative for chest pain.  Gastrointestinal: Positive for abdominal pain. Negative for blood in stool, constipation, diarrhea, nausea and vomiting.  Genitourinary: Positive for flank pain. Negative for difficulty urinating (no incontinence), dysuria, frequency, hematuria, vaginal bleeding and vaginal discharge.  Musculoskeletal: Negative for arthralgias and myalgias.  Skin: Negative for color change.  Allergic/Immunologic: Positive for immunocompromised state (DM2).  Neurological: Negative for weakness and numbness.  Psychiatric/Behavioral: Negative for confusion.   All other systems reviewed and are negative for acute change except as noted in the HPI.    Physical Exam Updated Vital Signs BP 130/72 (BP Location: Left Arm)   Pulse (!) 57   Temp 98.6 F (37 C) (Oral)   Resp 18   Ht 5\' 3"  (1.6 m)   Wt 106.6 kg (235 lb)   SpO2 98%   BMI 41.63 kg/m   Physical Exam  Constitutional: She is oriented to person, place, and time. Vital signs are normal. She appears  well-developed and well-nourished.  Non-toxic appearance. No distress.  Afebrile, nontoxic, NAD  HENT:  Head: Normocephalic and atraumatic.  Mouth/Throat: Oropharynx is clear and moist and mucous membranes are normal.  Eyes: Conjunctivae and EOM are normal. Right eye exhibits no discharge. Left eye  exhibits no discharge.  Neck: Normal range of motion. Neck supple.  Cardiovascular: Normal rate, regular rhythm, normal heart sounds and intact distal pulses. Exam reveals no gallop and no friction rub.  No murmur heard. Pulmonary/Chest: Effort normal and breath sounds normal. No respiratory distress. She has no decreased breath sounds. She has no wheezes. She has no rhonchi. She has no rales.  Abdominal: Soft. Normal appearance and bowel sounds are normal. She exhibits no distension. There is tenderness in the right lower quadrant. There is CVA tenderness. There is no rigidity, no rebound, no guarding, no tenderness at McBurney's point and negative Murphy's sign.    Soft, obese but not overtly distended, +BS throughout, with mild R lateral abd TTP starting at the RLQ laterally and tracking towards the R flank area, no r/g/r, neg murphy's, no focal mcburney's point TTP, with mild R sided CVA-area TTP   Musculoskeletal: Normal range of motion.       Lumbar back: She exhibits tenderness. She exhibits normal range of motion, no bony tenderness and no spasm.       Back:  Thoracic and lumbar spine with FROM intact without spinous process TTP, no bony stepoffs or deformities, with mild R sided paraspinous muscle TTP in the flank area and diffusely into the lumbar paraspinous muscle area, without palpable muscle spasms. No overlying skin changes. Strength and sensation grossly intact in all extremities, gait steady and nonantalgic. Distal pulses intact.   Neurological: She is alert and oriented to person, place, and time. She has normal strength. No sensory deficit.  Skin: Skin is warm, dry and intact. No  rash noted.  Psychiatric: She has a normal mood and affect.  Nursing note and vitals reviewed.    ED Treatments / Results  Labs (all labs ordered are listed, but only abnormal results are displayed) Labs Reviewed  URINALYSIS, ROUTINE W REFLEX MICROSCOPIC - Abnormal; Notable for the following components:      Result Value   Glucose, UA 50 (*)    Leukocytes, UA SMALL (*)    Bacteria, UA RARE (*)    All other components within normal limits  CBC WITH DIFFERENTIAL/PLATELET - Abnormal; Notable for the following components:   RBC 5.16 (*)    All other components within normal limits  COMPREHENSIVE METABOLIC PANEL - Abnormal; Notable for the following components:   Glucose, Bld 131 (*)    AST 97 (*)    ALT 101 (*)    All other components within normal limits  URINE CULTURE    EKG None  Radiology Ct Renal Stone Study  Result Date: 01/18/2018 CLINICAL DATA:  Right flank pain for the past 2 days. History of nephrolithiasis. EXAM: CT ABDOMEN AND PELVIS WITHOUT CONTRAST TECHNIQUE: Multidetector CT imaging of the abdomen and pelvis was performed following the standard protocol without IV contrast. COMPARISON:  01/03/2018. FINDINGS: Lower chest: Stable mild linear scarring at both lung bases. Small right lower lobe calcified granuloma. Hepatobiliary: No focal liver abnormality is seen. Status post cholecystectomy. No biliary dilatation. Pancreas: Unremarkable. No pancreatic ductal dilatation or surrounding inflammatory changes. Spleen: Normal in size without focal abnormality. Adrenals/Urinary Tract: Normal appearing adrenal glands. Tiny mid left renal calculus. Interval better visualized 3 mm calcification at the inferior aspect of the left urinary bladder. This is non dependent. Normal appearing right kidney and ureters. Stomach/Bowel: 6 mm proximal appendiceal appendicoliths. Otherwise, normal appearing appendix. Normal appearing stomach, small bowel and colon. Vascular/Lymphatic: No significant  vascular findings are present. No enlarged abdominal or  pelvic lymph nodes. Reproductive: Status post hysterectomy. No adnexal masses. Other: Small umbilical hernia containing fat. Musculoskeletal: Mild lumbar and lower thoracic spine degenerative changes. IMPRESSION: 1. No acute abnormality. 2. 6 mm appendicolith without evidence of appendicitis. 3. 3 mm calcification at the inferior aspect of the urinary bladder. This is in a non dependent and location and may be in the wall of the bladder. This could also represent a tiny adherent calculus. 4. Tiny, nonobstructing mid left renal calculus. Electronically Signed   By: Claudie Revering M.D.   On: 01/18/2018 21:51     CT abd/pelv 01/03/18: Study Result  CLINICAL DATA:  Lower abdominal pain.  Creatinine was obtained on site at [place] at [address]  Results: Creatinine 0.5 mg/dL.  EXAM: CT ABDOMEN AND PELVIS WITH CONTRAST  TECHNIQUE: Multidetector CT imaging of the abdomen and pelvis was performed using the standard protocol following bolus administration of intravenous contrast.  CONTRAST:  176mL ISOVUE-300 IOPAMIDOL (ISOVUE-300) INJECTION 61%  COMPARISON:  ERCP 09/07/2017. Right upper quadrant ultrasound 07/05/2017.  FINDINGS: Lower chest: The lung bases are clear without focal nodule, mass, or airspace disease. Minimal atelectasis is present. Heart size is normal. No significant pleural or pericardial effusion is present.  Hepatobiliary: There is mild fatty infiltration of the liver. No discrete lesions are present. Patient is status post cholecystectomy. Common bile duct is within normal limits.  Pancreas: Unremarkable. No pancreatic ductal dilatation or surrounding inflammatory changes.  Spleen: Spleen is mildly enlarged, measuring 14 cm cephalo caudad. Splenule is noted. No discrete lesions are evident.  Adrenals/Urinary Tract: Adrenal glands are normal bilaterally. A 7 mm lower pole right kidney  lesion likely represents a cyst but is too small to formally characterize. No other focal lesions are present. There is no stone or obstruction. Ureters are within normal limits. The urinary bladder is within.  Stomach/Bowel: Stomach and duodenum are unremarkable. Small is within normal limits. Terminal ileum is normal. Appendix is visualized and within limits. Ascending and transverse colon are normal. Descending and sigmoid colon are unremarkable.  Vascular/Lymphatic: No significant vascular findings are present. No enlarged abdominal or pelvic lymph nodes.  Reproductive: Status post hysterectomy. No adnexal masses.  Other: No abdominal wall hernia or abnormality. A paraumbilical hernia contains fat but no bowel. No other focal hernias are present. There is no significant free fluid.  Musculoskeletal: Vertebral body heights alignment are normal. Transitional S1 segment is noted. Focal lytic or blastic lesions are present. The pelvis is intact. Hips are located and within.  IMPRESSION: 1. Acute or focal lesion to explain the patient's lower abdominal pain. 2. Cholecystectomy without complication. 3. Hysterectomy. 4. Paraumbilical hernia without associated bowel. 5. Borderline splenomegaly. 6. Hepatic steatosis.   Electronically Signed   By: San Morelle M.D.   On: 01/04/2018 07:43     Procedures Procedures (including critical care time)  Medications Ordered in ED Medications  sodium chloride 0.9 % bolus 1,000 mL (1,000 mLs Intravenous New Bag/Given 01/18/18 2215)     Initial Impression / Assessment and Plan / ED Course  I have reviewed the triage vital signs and the nursing notes.  Pertinent labs & imaging results that were available during my care of the patient were reviewed by me and considered in my medical decision making (see chart for details).     47 y.o. female here with R flank pain radiating to R lateral abdomen x2 days, states it feels  less severe than prior kidney stones but still somewhat of  a similar type of pain; she's not sure if it's a kidney stone or just from her "bad back". On exam, mild R lateral abd TTP tracking from the RLQ area laterally and extending towards the R flank area, mild R CVA area TTP, nonperitoneal, no focal mcburney's point TTP. No midline spinal TTP but mild diffuse R sided paraspinous muscle TTP however no spasms appreciated. U/A with no nitrites, small leuks, 0-5 RBCs, 6-10 WBCs, 0-5 squamous, and rare bacteria; could represent UTI vs contaminated sample vs passing stone, will send for UCx for now but hold off on empiric UTI tx. DDx includes kidney stone vs musculoskeletal back pain; will proceed with labs and CT renal, if no stone then may consider UTI tx given her questionable U/A findings.Of note, she had a CT abd/pelv on 01/03/18 which showed fatty liver, mild splenomegaly, and 52mm right renal lesion in lower pole which is likely a cyst. Pt declines wanting anything for pain at this time.  Will give fluids and reassess shortly.   11:18 PM CBC w/diff WNL. CMP with stable chronically elevated LFTs (AST 97, ALT 101), and mildly elevated gluc 131, otherwise unremarkable. CT renal study showing 13mm appendicolith without evidence of appendicitis, and 27mm calcification in inferior aspect of urinary bladder which could be in the wall of the bladder or a tiny adherent calculus, also found a tiny nonobstructing mid left renal calculus, and mild degenerative changes in thoracic and lumbar spine, otherwise no acute findings. The calculus in the bladder could represent a recently passed stone, and could contribute to why she has symptoms. If so, then it would likely pass soon, and her symptoms would likely resolve. Could still also be musculoskeletal back pain, however no red flag s/sx of cord compression, doubt need for further emergent work up at this time. Pt feeling well at this time. Doubt UTI currently, UCx in process.  Advised tylenol/motrin for pain, heat to areas of pain, and f/up with urology in 1wk for recheck. Will give zofran for her chronic nausea. Regarding the appendicolith, doubt need for emergent surgical consult, but discussed that this increases her potential for appendicitis, advised f/up with surgery service to discuss possibly having her appendix taken out as an outpatient; strict return precautions advised, specifically if any s/sx of appendicitis develop, or for any worsening/changing symptoms. Discussed case with my attending Dr. Tomi Bamberger who agrees with plan. I explained the diagnosis and have given explicit precautions to return to the ER including for any other new or worsening symptoms. The patient understands and accepts the medical plan as it's been dictated and I have answered their questions. Discharge instructions concerning home care and prescriptions have been given. The patient is STABLE and is discharged to home in good condition.    Final Clinical Impressions(s) / ED Diagnoses   Final diagnoses:  Right flank pain  Nephrolithiasis  Appendicolith  Elevated LFTs  Chronic nausea    ED Discharge Orders        Ordered    ondansetron (ZOFRAN ODT) 4 MG disintegrating tablet  Every 8 hours PRN     01/18/18 [address] [place], [place] 01/18/18 2318    Dorie Rank, MD 01/18/18 [phone]

## 2018-01-20 LAB — URINE CULTURE: Culture: <10000 — AB

## 2018-01-29 ENCOUNTER — Ambulatory Visit: Payer: Medicaid Other | Admitting: Sports Medicine

## 2018-01-29 ENCOUNTER — Encounter: Payer: Self-pay | Admitting: Sports Medicine

## 2018-01-29 DIAGNOSIS — M79674 Pain in right toe(s): Secondary | ICD-10-CM

## 2018-01-29 DIAGNOSIS — M79675 Pain in left toe(s): Secondary | ICD-10-CM | POA: Diagnosis not present

## 2018-01-29 DIAGNOSIS — B351 Tinea unguium: Secondary | ICD-10-CM

## 2018-01-29 DIAGNOSIS — E119 Type 2 diabetes mellitus without complications: Secondary | ICD-10-CM

## 2018-01-29 NOTE — Progress Notes (Signed)
Subjective: Rhonda Higgins is a 47 y.o. female patient with history of diabetes who presents to office today complaining of long,mildly painful nails  while ambulating in shoes; unable to trim. Patient states that the glucose reading this morning was not checked but last night was 220. Patient admits changes with diabetic meds. Patient denies other issues.  Patient Active Problem List   Diagnosis Date Noted  . Cholelithiasis with chronic cholecystitis 09/06/2017  . Multinodular goiter (nontoxic) 11/17/2015  . Chest pain 07/20/2014  . Diabetes mellitus without complication ([place]) 15/10/6977  . GERD (gastroesophageal reflux disease) 07/19/2014  . Personality disorder ([place])   . Uterine cancer ([place]) 06/22/2011  . DM 02/09/2010  . HLD (hyperlipidemia) 02/09/2010  . Obstructive sleep apnea 02/09/2010  . Allergic rhinitis 02/09/2010  . History of uterine cancer 02/09/2010   Current Outpatient Medications on File Prior to Visit  Medication Sig Dispense Refill  . Ascorbic Acid (VITAMIN C) 1000 MG tablet Take 1,000 mg by mouth daily.    [name] aspirin EC 81 MG tablet Take 81 mg by mouth at bedtime.     [name] buPROPion (WELLBUTRIN XL) 150 MG 24 hr tablet Take 150 mg by mouth daily.     . cetirizine (ZYRTEC) 10 MG tablet Take 10 mg by mouth at bedtime.     . gabapentin (NEURONTIN) 400 MG capsule Take 400-1,200 mg by mouth 3 (three) times daily. Pt takes one capsule in the morning, one in the afternoon, and three at bedtime.    [name] glimepiride (AMARYL) 4 MG tablet Take 4 mg by mouth 2 (two) times daily.   5  . ibuprofen (ADVIL,MOTRIN) 800 MG tablet Take 800 mg by mouth every 8 (eight) hours as needed for moderate pain.    [name] lamoTRIgine (LAMICTAL) 200 MG tablet Take 200 mg by mouth at bedtime.   2  . LANTUS SOLOSTAR 100 UNIT/ML Solostar Pen Inject 30 Units into the skin at bedtime.   0  . levothyroxine (SYNTHROID, LEVOTHROID) 137 MCG tablet Take 137 mcg by mouth daily before breakfast.     . lisinopril  (PRINIVIL,ZESTRIL) 5 MG tablet Take 5 mg by mouth at bedtime.     . Multiple Vitamins-Minerals (MULTIVITAMIN WITH MINERALS) tablet Take 1 tablet by mouth every evening.     . ondansetron (ZOFRAN ODT) 4 MG disintegrating tablet Take 1 tablet (4 mg total) by mouth every 8 (eight) hours as needed for nausea or vomiting. 15 tablet 0  . pantoprazole (PROTONIX) 40 MG tablet Take 40 mg by mouth every morning.   0  . risperiDONE (RISPERDAL) 2 MG tablet Take 2 mg by mouth daily.   2  . simvastatin (ZOCOR) 40 MG tablet Take 40 mg by mouth at bedtime.     . sitaGLIPtin-metformin (JANUMET) 50-1000 MG tablet Take 1 tablet by mouth 2 (two) times daily with a meal.    . traMADol (ULTRAM) 50 MG tablet Take 1-2 tablets (50-100 mg total) by mouth every 6 (six) hours as needed for moderate pain (mild pain, refractory to tylneol). 15 tablet 0   No current facility-administered medications on file prior to visit.    Allergies  Allergen Reactions  . Hydrocodone Shortness Of Breath  . Tape Rash    Paper tape is ok  . Hydrocodone-Acetaminophen Itching  . Other Itching  . Codeine Itching    Recent Results (from the past 2160 hour(s))  Lipase, blood     Status: None   Collection Time: 11/14/17  5:42 PM  Result  Value Ref Range   Lipase 35 11 - 51 U/L    Comment: Performed at [facility], [place] [address], [place], [place] [address]  Comprehensive metabolic panel     Status: Abnormal   Collection Time: 11/14/17  5:42 PM  Result Value Ref Range   Sodium 140 135 - 145 mmol/L   Potassium 3.9 3.5 - 5.1 mmol/L   Chloride 105 101 - 111 mmol/L   CO2 25 22 - 32 mmol/L   Glucose, Bld 143 (H) 65 - 99 mg/dL   BUN 11 6 - 20 mg/dL   Creatinine, Ser 0.65 0.44 - 1.00 mg/dL   Calcium 8.6 (L) 8.9 - 10.3 mg/dL   Total Protein 6.9 6.5 - 8.1 g/dL   Albumin 4.0 3.5 - 5.0 g/dL   AST 143 (H) 15 - 41 U/L   ALT 81 (H) 14 - 54 U/L   Alkaline Phosphatase 95 38 - 126 U/L   Total Bilirubin 0.7 0.3 - 1.2 mg/dL    GFR calc non Af Amer >60 >60 mL/min   GFR calc Af Amer >60 >60 mL/min    Comment: (NOTE) The eGFR has been calculated using the CKD EPI equation. This calculation has not been validated in all clinical situations. eGFR's persistently <60 mL/min signify possible Chronic Kidney Disease.    Anion gap 10 5 - 15    Comment: Performed at [facility], [place] [address] [place], [place] [address]  CBC     Status: Abnormal   Collection Time: 11/14/17  5:42 PM  Result Value Ref Range   WBC 6.1 4.0 - 10.5 K/uL   RBC 5.14 (H) 3.87 - 5.11 MIL/uL   Hemoglobin 14.0 12.0 - 15.0 g/dL   HCT 43.4 36.0 - 46.0 %   MCV 84.4 78.0 - 100.0 fL   MCH 27.2 26.0 - 34.0 pg   MCHC 32.3 30.0 - 36.0 g/dL   RDW 14.1 11.5 - 15.5 %   Platelets 103 (L) 150 - 400 K/uL    Comment: SPECIMEN CHECKED FOR CLOTS REPEATED TO VERIFY PLATELET COUNT CONFIRMED BY SMEAR Performed at [place] [address], [place], [place] [address]   Urinalysis, Routine w reflex microscopic     Status: Abnormal   Collection Time: 11/14/17  5:46 PM  Result Value Ref Range   Color, Urine YELLOW YELLOW   APPearance CLEAR CLEAR   Specific Gravity, Urine 1.021 1.005 - 1.030   pH 6.0 5.0 - 8.0   Glucose, UA NEGATIVE NEGATIVE mg/dL   Hgb urine dipstick NEGATIVE NEGATIVE   Bilirubin Urine NEGATIVE NEGATIVE   Ketones, ur 5 (A) NEGATIVE mg/dL   Protein, ur NEGATIVE NEGATIVE mg/dL   Nitrite NEGATIVE NEGATIVE   Leukocytes, UA NEGATIVE NEGATIVE    Comment: Performed at [place] [address] [place], [place] [address]  Urinalysis, Routine w reflex microscopic- may I&O cath if menses     Status: Abnormal   Collection Time: 01/18/18  4:37 PM  Result Value Ref Range   Color, Urine YELLOW YELLOW   APPearance CLEAR CLEAR   Specific Gravity, Urine 1.020 1.005 - 1.030   pH 6.0 5.0 - 8.0   Glucose, UA 50 (A) NEGATIVE mg/dL   Hgb urine dipstick NEGATIVE NEGATIVE   Bilirubin Urine  NEGATIVE NEGATIVE   Ketones, ur NEGATIVE NEGATIVE mg/dL   Protein, ur NEGATIVE NEGATIVE mg/dL   Nitrite NEGATIVE NEGATIVE   Leukocytes, UA SMALL (A) NEGATIVE   RBC /  HPF 0-5 0 - 5 RBC/hpf   WBC, UA 6-10 0 - 5 WBC/hpf   Bacteria, UA RARE (A) NONE SEEN   Squamous Epithelial / LPF 0-5 0 - 5    Comment: Performed at [facility], [place] [address], [place], [place] [address]  CBC with Differential     Status: Abnormal   Collection Time: 01/18/18 10:16 PM  Result Value Ref Range   WBC 5.3 4.0 - 10.5 K/uL   RBC 5.16 (H) 3.87 - 5.11 MIL/uL   Hemoglobin 14.0 12.0 - 15.0 g/dL   HCT 43.0 36.0 - 46.0 %   MCV 83.3 78.0 - 100.0 fL   MCH 27.1 26.0 - 34.0 pg   MCHC 32.6 30.0 - 36.0 g/dL   RDW 15.3 11.5 - 15.5 %   Platelets 108 (L) 150 - 400 K/uL    Comment: SPECIMEN CHECKED FOR CLOTS REPEATED TO VERIFY PLATELET COUNT CONFIRMED BY SMEAR    Neutrophils Relative % 59 %   Neutro Abs 3.1 1.7 - 7.7 K/uL   Lymphocytes Relative 31 %   Lymphs Abs 1.6 0.7 - 4.0 K/uL   Monocytes Relative 7 %   Monocytes Absolute 0.4 0.1 - 1.0 K/uL   Eosinophils Relative 3 %   Eosinophils Absolute 0.2 0.0 - 0.7 K/uL   Basophils Relative 0 %   Basophils Absolute 0.0 0.0 - 0.1 K/uL    Comment: Performed at [facility], [place] [address], [place], [place] [address]  Comprehensive metabolic panel     Status: Abnormal   Collection Time: 01/18/18 10:16 PM  Result Value Ref Range   Sodium 142 135 - 145 mmol/L   Potassium 3.9 3.5 - 5.1 mmol/L   Chloride 104 98 - 111 mmol/L   CO2 28 22 - 32 mmol/L   Glucose, Bld 131 (H) 70 - 99 mg/dL   BUN 14 6 - 20 mg/dL   Creatinine, Ser 0.61 0.44 - 1.00 mg/dL   Calcium 9.0 8.9 - 10.3 mg/dL   Total Protein 6.8 6.5 - 8.1 g/dL   Albumin 3.8 3.5 - 5.0 g/dL   AST 97 (H) 15 - 41 U/L   ALT 101 (H) 0 - 44 U/L   Alkaline Phosphatase 88 38 - 126 U/L   Total Bilirubin 0.7 0.3 - 1.2 mg/dL   GFR calc non Af Amer >60 >60 mL/min   GFR calc Af Amer >60 >60  mL/min    Comment: (NOTE) The eGFR has been calculated using the CKD EPI equation. This calculation has not been validated in all clinical situations. eGFR's persistently <60 mL/min signify possible Chronic Kidney Disease.    Anion gap 10 5 - 15    Comment: Performed at [facility], [place] [address], [place], [place] [address]  Urine culture     Status: Abnormal   Collection Time: 01/18/18 10:16 PM  Result Value Ref Range   Specimen Description      URINE, RANDOM Performed at [place] [address] [place], [place] [address]    Special Requests      NONE Performed at [facility], [place] [address], [place], [place] [address]    Culture (A)     <10,000 COLONIES/mL INSIGNIFICANT GROWTH Performed at [place] [address], [place], [place] [address]    Report Status 01/20/2018 FINAL     Objective: General: Patient is awake, alert, and oriented x 3 and in no acute distress.  Integument: Skin is warm, dry and supple bilateral. Nails are tender, long, thickened and dystrophic with subungual debris, consistent with onychomycosis, 1-5 bilateral. Left great toe healed from previous nail surgery. No signs of infection. No open lesions or preulcerative lesions present bilateral. Remaining integument unremarkable.  Vasculature:  Dorsalis Pedis pulse 2/4 bilateral. Posterior Tibial pulse  1/4 bilateral. Capillary fill time <3 sec 1-5 bilateral. Positive hair growth to the level of the digits.Temperature gradient within normal limits. No varicosities present bilateral. No edema present bilateral.   Neurology: The patient has intact sensation measured with a 5.07/10g Semmes Weinstein Monofilament at all pedal sites bilateral . Vibratory sensation diminished bilateral with tuning fork. No Babinski sign present bilateral.   Musculoskeletal: No symptomatic pedal deformities noted bilateral. Muscular strength 5/5 in all lower  extremity muscular groups bilateral without pain on range of motion . No tenderness with calf compression bilateral.  Assessment and Plan: Problem List Items Addressed This Visit      Endocrine   Diabetes mellitus without complication ([place])    Other Visit Diagnoses    Pain due to onychomycosis of toenails of both feet    -  Primary     -Examined patient. -Discussed and educated patient on diabetic foot care, especially with  regards to the vascular, neurological and musculoskeletal systems.  -Stressed the importance of good glycemic control and the detriment of not  controlling glucose levels in relation to the foot. -Mechanically debrided all nails 1-5 bilateral using sterile nail nipper and filed with dremel without incident  -Patient to return  in 3 months for at risk foot care -Patient advised to call the office if any problems or questions arise in the meantime.  Landis Martins, DPM

## 2018-03-22 ENCOUNTER — Other Ambulatory Visit: Payer: Self-pay | Admitting: Family Medicine

## 2018-03-22 DIAGNOSIS — Z1231 Encounter for screening mammogram for malignant neoplasm of breast: Secondary | ICD-10-CM

## 2018-04-04 ENCOUNTER — Other Ambulatory Visit: Payer: Self-pay | Admitting: Gastroenterology

## 2018-04-04 DIAGNOSIS — K7469 Other cirrhosis of liver: Secondary | ICD-10-CM

## 2018-04-10 ENCOUNTER — Other Ambulatory Visit: Payer: Medicaid Other

## 2018-04-11 ENCOUNTER — Ambulatory Visit
Admission: RE | Admit: 2018-04-11 | Discharge: 2018-04-11 | Disposition: A | Discharge status: Home / Self Care | Payer: Medicaid Other | Source: Ambulatory Visit | Attending: Gastroenterology | Admitting: Gastroenterology

## 2018-04-11 DIAGNOSIS — K7469 Other cirrhosis of liver: Secondary | ICD-10-CM

## 2018-04-14 ENCOUNTER — Other Ambulatory Visit: Payer: Self-pay

## 2018-04-14 ENCOUNTER — Emergency Department (HOSPITAL_COMMUNITY)
Admission: EM | Admit: 2018-04-14 | Discharge: 2018-04-15 | Disposition: A | Discharge status: Home / Self Care | Payer: Medicaid Other | Attending: Emergency Medicine | Admitting: Emergency Medicine

## 2018-04-14 ENCOUNTER — Encounter (HOSPITAL_COMMUNITY): Payer: Self-pay | Admitting: *Deleted

## 2018-04-14 DIAGNOSIS — Z794 Long term (current) use of insulin: Secondary | ICD-10-CM | POA: Diagnosis not present

## 2018-04-14 DIAGNOSIS — Z8542 Personal history of malignant neoplasm of other parts of uterus: Secondary | ICD-10-CM | POA: Insufficient documentation

## 2018-04-14 DIAGNOSIS — G4459 Other complicated headache syndrome: Secondary | ICD-10-CM | POA: Diagnosis not present

## 2018-04-14 DIAGNOSIS — Z7982 Long term (current) use of aspirin: Secondary | ICD-10-CM | POA: Insufficient documentation

## 2018-04-14 DIAGNOSIS — R51 Headache: Secondary | ICD-10-CM | POA: Diagnosis present

## 2018-04-14 DIAGNOSIS — E039 Hypothyroidism, unspecified: Secondary | ICD-10-CM | POA: Diagnosis not present

## 2018-04-14 DIAGNOSIS — Z79899 Other long term (current) drug therapy: Secondary | ICD-10-CM | POA: Insufficient documentation

## 2018-04-14 DIAGNOSIS — E119 Type 2 diabetes mellitus without complications: Secondary | ICD-10-CM | POA: Diagnosis not present

## 2018-04-14 NOTE — ED Triage Notes (Signed)
Pt c/o low b/p pressure prior to arrival & stated "but my head felt like pressure all over."  Pt denies syncope.

## 2018-04-15 LAB — CBG MONITORING, ED: Glucose-Capillary: 102 mg/dL — ABNORMAL HIGH (ref 70–99)

## 2018-04-15 NOTE — ED Provider Notes (Signed)
[facility] DEPT Provider Note  CSN: [id number] Arrival date & time: 04/14/18 2204  Chief Complaint(s) Hypotension  HPI Rhonda Higgins is a 47 y.o. female   The history is provided by the patient.  Headache   This is a recurrent (felt once before, about 1 week ago.) problem. Episode onset: 7 hrs; lasted approx 2 hrs. The problem occurs constantly. The problem has been resolved. The headache is associated with nothing. Pain location: generalized. Quality: pressure. The pain is moderate. The pain does not radiate. Associated symptoms include nausea. Pertinent negatives include no anorexia, no fever, no malaise/fatigue, no chest pressure, no near-syncope, no orthopnea, no palpitations, no syncope, no shortness of breath and no vomiting. She has tried NSAIDs for the symptoms. The treatment provided significant relief.   When headache started, she checked her BP and noted that it was 99/56, then 103/50's, then 110/50's.  Patient reports that this episode and the previous episodes were associated with eating.    Past Medical History Past Medical History:  Diagnosis Date  . Adenocarcinoma (HCC)    endometrial, FIGO GRADE 1  . Allergic rhinitis   . Atypical chest pain    History of  . Depression   . Elevated liver enzymes   . GERD (gastroesophageal reflux disease)   . Hematuria   . History of endometrial cancer 08-02-2009   oncologist-  dr brewster/ [place] George and dr kinard/  no recurrence   endometrial adenocarinoma Stage 1B, Grade 1, FIGO--  s/p  TAH w/ BSO and pelvic lymph node dissection's and radiation therapy  . History of kidney stones   . History of radiation therapy    2011  pelvic intracavity brachytherapy treatment's for endometrial carcinoma  . History of thyroid nodule    multinodular goiter s/p  total thyroidectomy 11-19-2015  per pathology -  adenomatoid nodules  . Hyperlipidemia   . Hypothyroidism, postsurgical   . 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 Left ureteral stone   . Obesity   . OSA (obstructive sleep apnea)    severe OSA  per study 03-08-2010--  noncomplant cpap  . Overactive bladder   . Personality disorder ([place])   . Polyphagia(783.6)   . PONV (postoperative nausea and vomiting)    after ear surgery only one time  . Right lower quadrant pain   . Urgency of urination   . UTI (urinary tract infection)    Patient Active Problem List   Diagnosis Date Noted  . Cholelithiasis with chronic cholecystitis 09/06/2017  . Multinodular goiter (nontoxic) 11/17/2015  . Chest pain 07/20/2014  . Diabetes mellitus without complication ([place]) 51/88/4166  . GERD (gastroesophageal reflux disease) 07/19/2014  . Personality disorder ([place])   . Uterine cancer ([place]) 06/22/2011  . DM 02/09/2010  . HLD (hyperlipidemia) 02/09/2010  . Obstructive sleep apnea 02/09/2010  . Allergic rhinitis 02/09/2010  . History of uterine cancer 02/09/2010   Home Medication(s) Prior to Admission medications   Medication Sig Start Date End Date Taking? Authorizing Provider  Ascorbic Acid (VITAMIN C) 1000 MG tablet Take 1,000 mg by mouth daily.    [provider]  aspirin EC 81 MG tablet Take 81 mg by mouth at bedtime.     [provider]  buPROPion (WELLBUTRIN XL) 150 MG 24 hr tablet Take 150 mg by mouth daily.     [provider]  cetirizine (ZYRTEC) 10 MG tablet Take 10 mg by mouth at bedtime.  [provider]  gabapentin (NEURONTIN) 400 MG capsule Take 400-1,200 mg by mouth 3 (three) times daily. Pt takes one capsule in the morning, one in the afternoon, and three at bedtime.    [provider]  glimepiride (AMARYL) 4 MG tablet Take 4 mg by mouth 2 (two) times daily.     [provider]  ibuprofen (ADVIL,MOTRIN) 800 MG tablet Take 800 mg by mouth every 8 (eight) hours as needed for moderate pain.    [provider]  lamoTRIgine (LAMICTAL) 200 MG tablet Take  200 mg by mouth at bedtime.     [provider]  LANTUS SOLOSTAR 100 UNIT/ML Solostar Pen Inject 30 Units into the skin at bedtime.  11/03/16   [provider]  levothyroxine (SYNTHROID, LEVOTHROID) 137 MCG tablet Take 137 mcg by mouth daily before breakfast.     [provider]  lisinopril (PRINIVIL,ZESTRIL) 5 MG tablet Take 5 mg by mouth at bedtime.     [provider]  Multiple Vitamins-Minerals (MULTIVITAMIN WITH MINERALS) tablet Take 1 tablet by mouth every evening.     [provider]  ondansetron (ZOFRAN ODT) 4 MG disintegrating tablet Take 1 tablet (4 mg total) by mouth every 8 (eight) hours as needed for nausea or vomiting. 01/18/18   Street, Mercedes, PA-C  pantoprazole (PROTONIX) 40 MG tablet Take 40 mg by mouth every morning.     [provider]  risperiDONE (RISPERDAL) 2 MG tablet Take 2 mg by mouth daily.  08/22/16   [provider]  simvastatin (ZOCOR) 40 MG tablet Take 40 mg by mouth at bedtime.     [provider]  sitaGLIPtin-metformin (JANUMET) 50-1000 MG tablet Take 1 tablet by mouth 2 (two) times daily with a meal.    [provider]  traMADol (ULTRAM) 50 MG tablet Take 1-2 tablets (50-100 mg total) by mouth every 6 (six) hours as needed for moderate pain (mild pain, refractory to tylneol). 09/07/17   Armandina Gemma, MD                                                                                                                                    Past Surgical History Past Surgical History:  Procedure Laterality Date  . CARDIOVASCULAR STRESS TEST  06/09/2008   normal nuclear study w/ no ischemia/  normal LV function and wall motion , ef 83%  . CHOLECYSTECTOMY N/A 09/06/2017   Procedure: LAPAROSCOPIC CHOLECYSTECTOMY WITH INTRAOPERATIVE CHOLANGIOGRAM;  Surgeon: Armandina Gemma, MD;  Location: [facility];  Service: General;  Laterality: N/A;  . CYSTOSCOPY/RETROGRADE/URETEROSCOPY/STONE EXTRACTION WITH BASKET Left  03/08/2016   Procedure: CYSTOSCOPY/RETROGRADE/URETEROSCOPY/STONE EXTRACTION WITH BASKET, STENT PLACEMENT;  Surgeon: Rana Snare, MD;  Location: [facility];  Service: Urology;  Laterality: Left;  . ENDOMETRIAL BIOPSY    . ERCP N/A 09/07/2017   Procedure: ENDOSCOPIC RETROGRADE CHOLANGIOPANCREATOGRAPHY (ERCP);  Surgeon: [name], MD;  Location: [facility];  Service:  Gastroenterology;  Laterality: N/A;  . HOLMIUM LASER APPLICATION Left 3/54/6568   Procedure: HOLMIUM LASER APPLICATION;  Surgeon: Rana Snare, MD;  Location: [facility];  Service: Urology;  Laterality: Left;  . MOUTH SURGERY    . MYRINGECOTMY W/ REMOVAL MIDDLE EAR CHOLESTEATOMA TYPE 1 FASICA TYMPANOPLASTY  09/13/2000  . ROBOTIC ASSISTED TOTAL HYSTERECTOMY WITH BILATERAL SALPINGO OOPHERECTOMY  08-02-2009   at [facility]  dr Denman George   w/  Bilateral pelvic and para aortic lymph node dissection's  . THYROIDECTOMY N/A 11/19/2015   Procedure: TOTAL THYROIDECTOMY;  Surgeon: Armandina Gemma, MD;  Location: [facility];  Service: General;  Laterality: N/A;  . TONSILLECTOMY  age 20  . TRANSTHORACIC ECHOCARDIOGRAM  07/19/2014   ef 55-60%/  trivial TR  . TYMPANOPLASTY Right 1993   Family History Family History  Problem Relation Age of Onset  . Heart disease Sister   . Heart attack Brother   . Heart disease Brother   . Heart disease Sister   . Diabetes Sister   . Asthma Mother   . Heart disease Mother   . Diabetes Mother   . Emphysema Mother   . Hypertension Mother   . Stroke Mother   . Prostate cancer Father     Social History Social History   Tobacco Use  . Smoking status: Never Smoker  . Smokeless tobacco: Never Used  Substance Use Topics  . Alcohol use: No  . Drug use: No   Allergies Hydrocodone; Tape; Hydrocodone-acetaminophen; Other; and Codeine  Review of Systems Review of Systems  Constitutional: Negative for fever and malaise/fatigue.  Respiratory: Negative for shortness of breath.     Cardiovascular: Negative for palpitations, orthopnea, syncope and near-syncope.  Gastrointestinal: Positive for nausea. Negative for anorexia and vomiting.  Neurological: Positive for headaches.   All other systems are reviewed and are negative for acute change except as noted in the HPI  Physical Exam Vital Signs  I have reviewed the triage vital signs BP 116/64   Pulse (!) 57   Temp 98.3 F (36.8 C) (Oral)   Resp 16   Ht 5\' 3"  (1.6 m)   Wt 105.2 kg   SpO2 99%   BMI 41.10 kg/m   Physical Exam  Constitutional: She is oriented to person, place, and time. She appears well-developed and well-nourished. No distress.  HENT:  Head: Normocephalic and atraumatic.  Nose: Nose normal.  Eyes: Pupils are equal, round, and reactive to light. Conjunctivae and EOM are normal. Right eye exhibits no discharge. Left eye exhibits no discharge. No scleral icterus.  Neck: Normal range of motion. Neck supple.  Cardiovascular: Normal rate and regular rhythm. Exam reveals no gallop and no friction rub.  No murmur heard. Pulmonary/Chest: Effort normal and breath sounds normal. No stridor. No respiratory distress. She has no rales.  Abdominal: Soft. She exhibits no distension. There is no tenderness.  Musculoskeletal: She exhibits no edema or tenderness.  Neurological: She is alert and oriented to person, place, and time.  Mental Status:  Alert and oriented to person, place, and time.  Attention and concentration normal.  Speech clear.  Recent memory is intact  Cranial Nerves:  II Visual Fields: Intact to confrontation. Visual fields intact. III, IV, VI: Pupils equal and reactive to light and near. Full eye movement without nystagmus  V Facial Sensation: Normal. No weakness of masticatory muscles  VII: No facial weakness or asymmetry  VIII Auditory Acuity: Grossly normal  IX/X: The uvula is midline; the palate elevates symmetrically  XI: Normal sternocleidomastoid and trapezius strength  XII:  The tongue is midline. No atrophy or fasciculations.   Motor System: Muscle Strength: 5/5 and symmetric in the upper and lower extremities. No pronation or drift.  Muscle Tone: Tone and muscle bulk are normal in the upper and lower extremities.   Reflexes: DTRs: 1+ and symmetrical in all four extremities. No Clonus Coordination: Intact finger-to-nose. No tremor.  Sensation: Intact to light touch, and pinprick. Gait: Routine  gait normal.   Skin: Skin is warm and dry. No rash noted. She is not diaphoretic. No erythema.  Psychiatric: She has a normal mood and affect.  Vitals reviewed.   ED Results and Treatments Labs (all labs ordered are listed, but only abnormal results are displayed) Labs Reviewed  CBG MONITORING, ED - Abnormal; Notable for the following components:      Result Value   Glucose-Capillary 102 (*)    All other components within normal limits                                                                                                                         EKG  EKG Interpretation  Date/Time:    Ventricular Rate:    PR Interval:    QRS Duration:   QT Interval:    QTC Calculation:   R Axis:     Text Interpretation:        Radiology No results found. Pertinent labs & imaging results that were available during my care of the patient were reviewed by me and considered in my medical decision making (see chart for details).  Medications Ordered in ED Medications - No data to display                                                                                                                                  Procedures Procedures  (including critical care time)  Medical Decision Making / ED Course I have reviewed the nursing notes for this encounter and the patient's prior records (if available in EHR or on provided paperwork).    Non focal neuro exam. No recent head trauma. No fever. Doubt meningitis. Doubt intracranial bleed. Doubt IIH. No indication  for imaging.   Possibly related to low blood pressures from vasovagal following eating.  Orthostatics reassuring.  CBG was reassuring.  The patient appears reasonably screened and/or stabilized for discharge and I doubt any other medical condition  or other [facility] requiring further screening, evaluation, or treatment in the ED at this time prior to discharge.  The patient is safe for discharge with strict return precautions.     Final Clinical Impression(s) / ED Diagnoses Final diagnoses:  Other complicated headache syndrome    Disposition: Discharge  Condition: Good  I have discussed the results, Dx and Tx plan with the patient who expressed understanding and agree(s) with the plan. Discharge instructions discussed at great length. The patient was given strict return precautions who verbalized understanding of the instructions. No further questions at time of discharge.    ED Discharge Orders    None       Follow Up: [name], MD [place] [address] [phone]  Schedule an appointment as soon as possible for a visit  As needed     This chart was dictated using voice recognition software.  Despite best efforts to proofread,  errors can occur which can change the documentation meaning.   Fatima Blank, MD 04/15/18 [phone]

## 2018-04-25 ENCOUNTER — Ambulatory Visit
Admission: RE | Admit: 2018-04-25 | Discharge: 2018-04-25 | Disposition: A | Discharge status: Home / Self Care | Payer: Medicaid Other | Source: Ambulatory Visit | Attending: Family Medicine | Admitting: Family Medicine

## 2018-04-25 DIAGNOSIS — Z1231 Encounter for screening mammogram for malignant neoplasm of breast: Secondary | ICD-10-CM

## 2018-04-25 IMAGING — MG DIGITAL SCREENING BILATERAL MAMMOGRAM WITH TOMO AND CAD
8 series · 8 of 24 positions shown · non-contrast
Comparison: Previous exam(s).

CLINICAL DATA: Screening.

EXAM:
DIGITAL SCREENING BILATERAL MAMMOGRAM WITH TOMO AND CAD

[L MLO synth-2D]
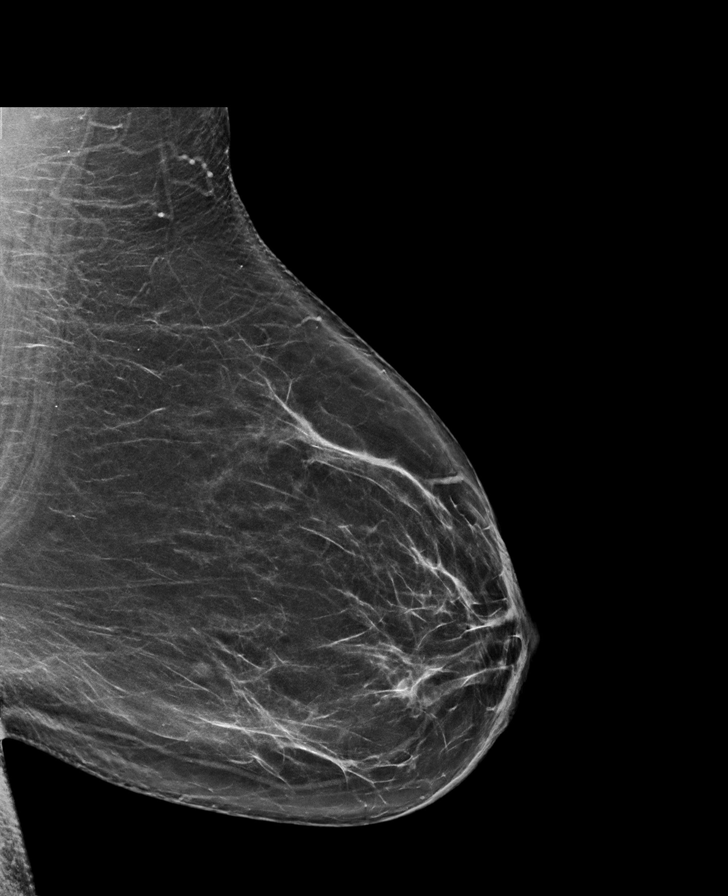

[L CC synth-2D]
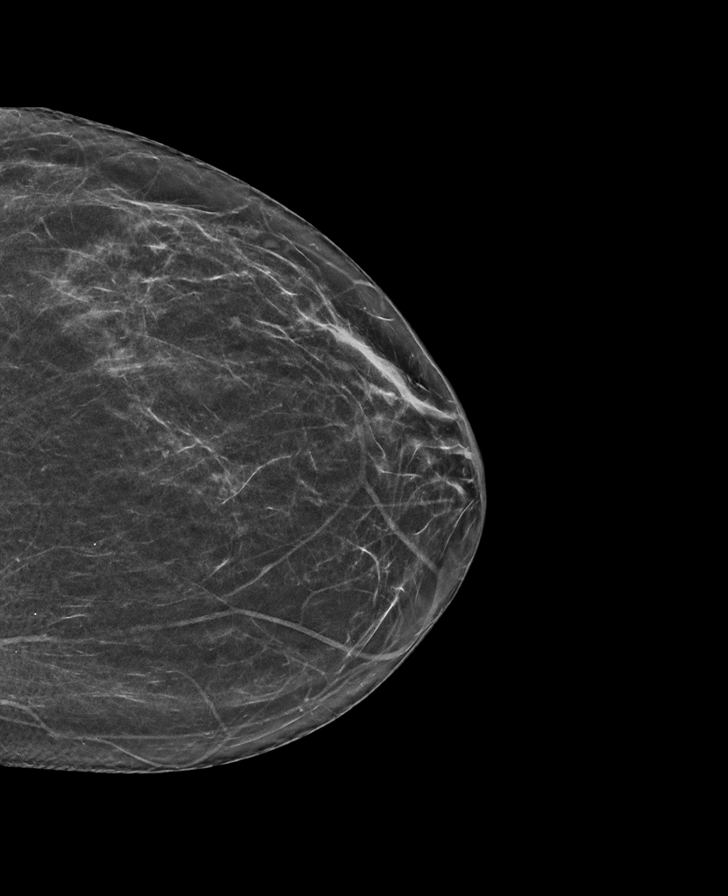

[R CC synth-2D]
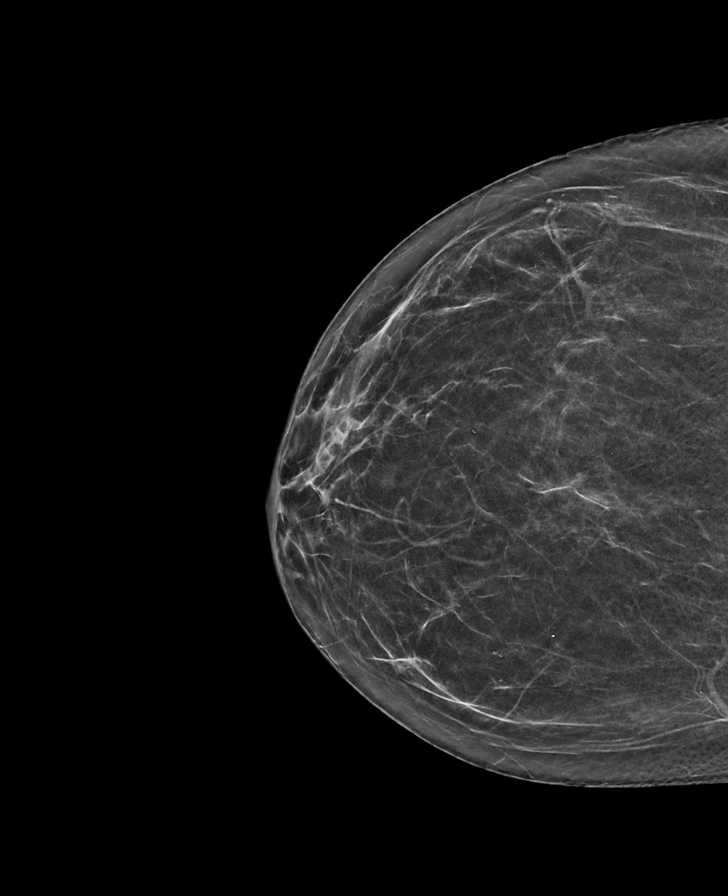

[R MLO synth-2D]
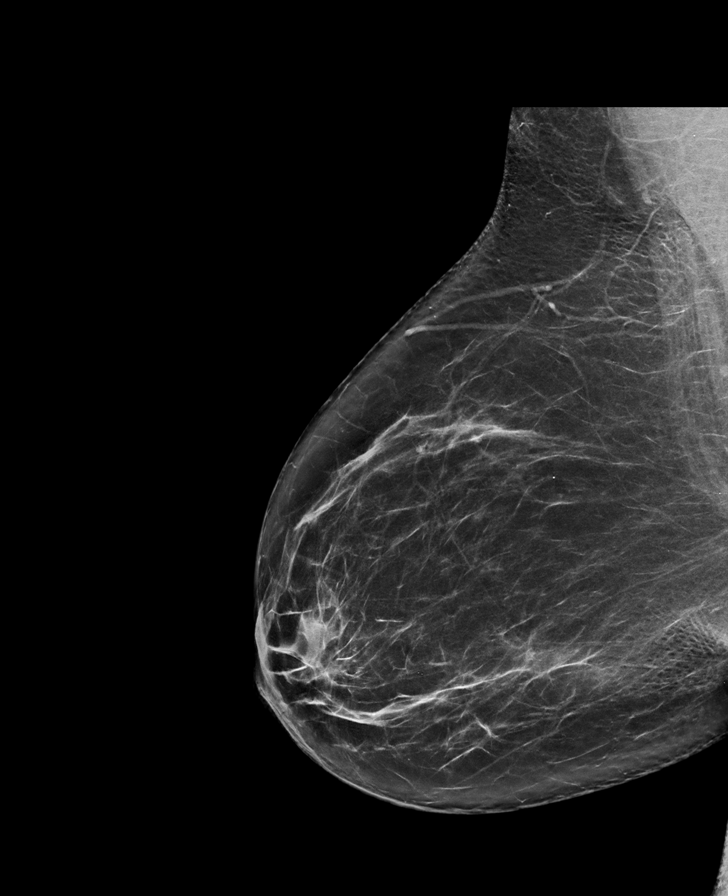

[L MLO tomo · tomo slice 40/79.0]
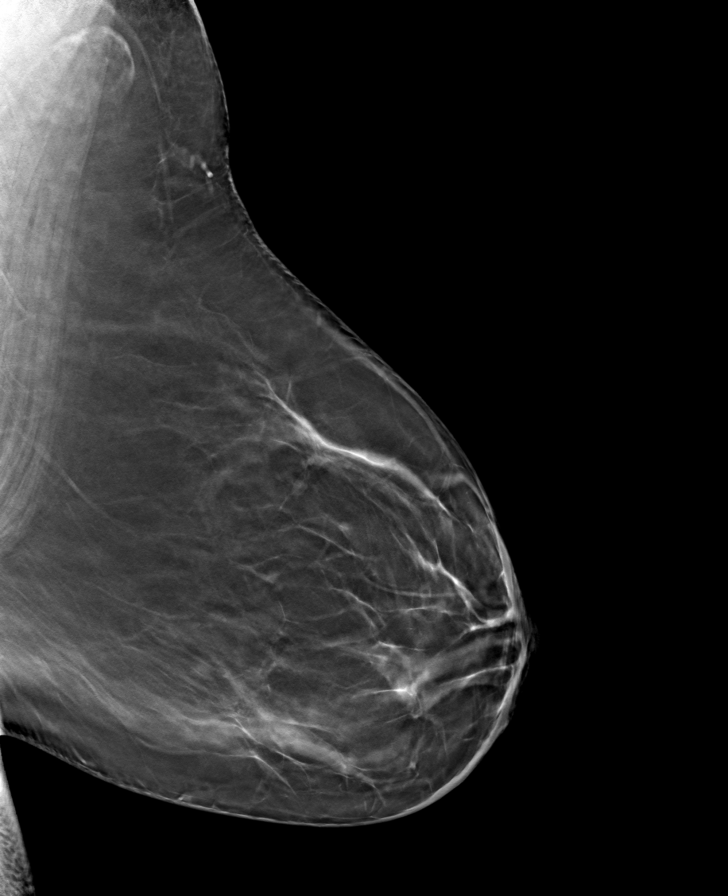

[R CC tomo · tomo slice 29/58.0]
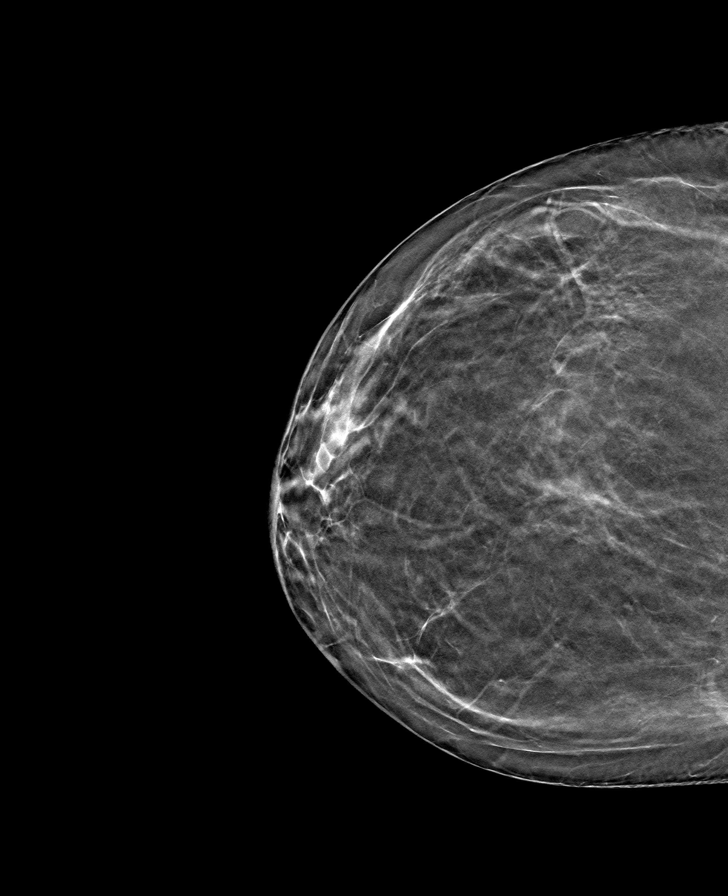

[L CC tomo · tomo slice 31/60.0]
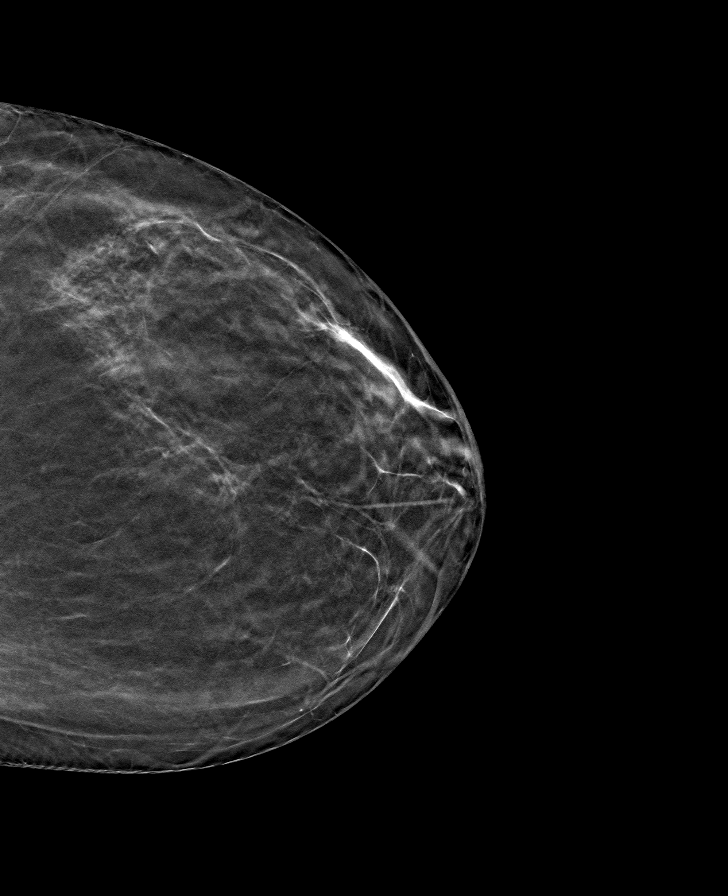

[R MLO tomo · tomo slice 41/80.0]
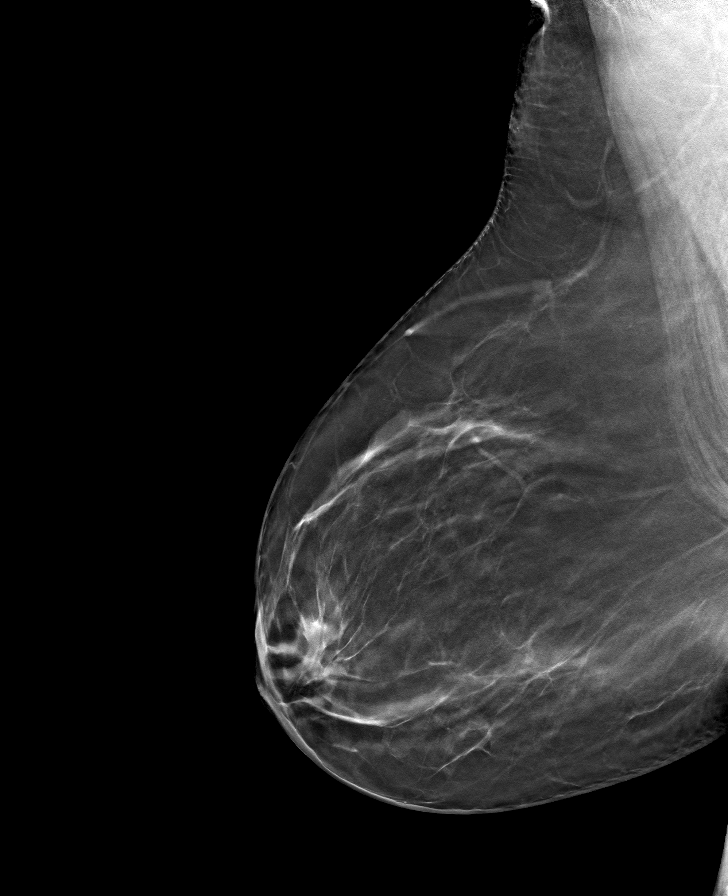

[8 of 24 positions shown; findings below may reference images not displayed]

ACR Breast Density Category b: There are scattered areas of
fibroglandular density.
FINDINGS: There are no findings suspicious for malignancy. Images were
processed with CAD.
IMPRESSION: No mammographic evidence of malignancy. A result letter of this
screening mammogram will be mailed directly to the patient.

RECOMMENDATION:
Screening mammogram in one year. (Code:[TQ])

BI-RADS CATEGORY  1: Negative.

## 2018-04-30 ENCOUNTER — Encounter: Payer: Self-pay | Admitting: Podiatry

## 2018-04-30 ENCOUNTER — Ambulatory Visit: Payer: Medicaid Other | Admitting: Podiatry

## 2018-04-30 DIAGNOSIS — E1142 Type 2 diabetes mellitus with diabetic polyneuropathy: Secondary | ICD-10-CM

## 2018-04-30 DIAGNOSIS — M79675 Pain in left toe(s): Secondary | ICD-10-CM

## 2018-04-30 DIAGNOSIS — B351 Tinea unguium: Secondary | ICD-10-CM | POA: Diagnosis not present

## 2018-04-30 DIAGNOSIS — M79674 Pain in right toe(s): Secondary | ICD-10-CM

## 2018-04-30 NOTE — Patient Instructions (Signed)

## 2018-04-30 NOTE — Progress Notes (Signed)
Subjective: Rhonda Higgins presents today  for follow-up foot care.  She is seen today for painful, discolored, thick toenails of both feet.  Her pain is aggravated when wearing enclosed shoe gear.  Her pain is relieved with periodic professional debridement.    Today Rhonda Higgins relates pain in the bottom of both feet.  She describes the pain is sore and tender.  She denies any traumatic episode prior to this pain.  She does relate that she is on a significantly high dose of Neurontin which she has not taken in months.  She relates the pills are too large for her to swallow and she has a very sensitive gag reflex.  She was prescribed a total of 1200 mg of Neurontin per day in order to help her sleep.  Objective: There were no vitals filed for this visit. Vascular Examination: Capillary refill time immediate x 10 digits Dorsalis pedis and posterior tibial pulses present b/l No digital hair x 10 digits Skin temperature gradient warm to warm b/l  Dermatological Examination: Skin with normal turgor texture and tone bilaterally Toenails 1-5 b/l discolored, thick, dystrophic with subungual debris and pain with palpation to nailbeds due to thickness of nails.  Musculoskeletal: Muscle strength 5/5 to all LE muscle groups  Neurological: Sensation intact with 10 gram monofilament. Vibratory sensation intact.  Assessment: 1. Painful onychomycosis toenails 1-5 b/l  2.  Diabetes with diabetic neuropathy  Plan: 1. Discussed diabetic neuropathy with Rhonda Higgins.  I advised her she should resume her Neurontin to help with her symptoms of her diabetic neuropathy.  I suggested that she discuss with her psychiatrist seeing if a smaller capsule can be given.  This will most likely be a lower dosage and she would have to take more capsules.  She related understanding and will discuss with her psychiatrist.  She does relate her feet felt better when she was on the Neurontin. 2. Toenails 1-5  b/l were debrided in length and girth without iatrogenic bleeding. 3. Patient to continue soft, supportive shoe gear 4. Patient to report any pedal injuries to medical professional immediately. 5. Follow up 3 months. Patient/POA to call should there be a concern in the interim.

## 2018-07-30 ENCOUNTER — Ambulatory Visit (INDEPENDENT_AMBULATORY_CARE_PROVIDER_SITE_OTHER): Payer: Medicaid Other | Admitting: Podiatry

## 2018-07-30 ENCOUNTER — Encounter: Payer: Self-pay | Admitting: Podiatry

## 2018-07-30 DIAGNOSIS — M79674 Pain in right toe(s): Secondary | ICD-10-CM | POA: Diagnosis not present

## 2018-07-30 DIAGNOSIS — B351 Tinea unguium: Secondary | ICD-10-CM

## 2018-07-30 DIAGNOSIS — M79675 Pain in left toe(s): Secondary | ICD-10-CM | POA: Diagnosis not present

## 2018-07-30 NOTE — Patient Instructions (Signed)
Onychomycosis/Fungal Toenails  WHAT IS IT? An infection that lies within the keratin of your nail plate that is caused by a fungus.  WHY ME? Fungal infections affect all ages, sexes, races, and creeds.  There may be many factors that predispose you to a fungal infection such as age, coexisting medical conditions such as diabetes, or an autoimmune disease; stress, medications, fatigue, genetics, etc.  Bottom line: fungus thrives in a warm, moist environment and your shoes offer such a location.  IS IT CONTAGIOUS? Theoretically, yes.  You do not want to share shoes, nail clippers or files with someone who has fungal toenails.  Walking around barefoot in the same room or sleeping in the same bed is unlikely to transfer the organism.  It is important to realize, however, that fungus can spread easily from one nail to the next on the same foot.  HOW DO WE TREAT THIS?  There are several ways to treat this condition.  Treatment may depend on many factors such as age, medications, pregnancy, liver and kidney conditions, etc.  It is best to ask your doctor which options are available to you.  1. No treatment.   Unlike many other medical concerns, you can live with this condition.  However for many people this can be a painful condition and may lead to ingrown toenails or a bacterial infection.  It is recommended that you keep the nails cut short to help reduce the amount of fungal nail. 2. Topical treatment.  These range from herbal remedies to prescription strength nail lacquers.  About 40-50% effective, topicals require twice daily application for approximately 9 to 12 months or until an entirely new nail has grown out.  The most effective topicals are medical grade medications available through physicians offices. 3. Oral antifungal medications.  With an 80-90% cure rate, the most common oral medication requires 3 to 4 months of therapy and stays in your system for a year as the new nail grows out.  Oral  antifungal medications do require blood work to make sure it is a safe drug for you.  A liver function panel will be performed prior to starting the medication and after the first month of treatment.  It is important to have the blood work performed to avoid any harmful side effects.  In general, this medication safe but blood work is required. 4. Laser Therapy.  This treatment is performed by applying a specialized laser to the affected nail plate.  This therapy is noninvasive, fast, and non-painful.  It is not covered by insurance and is therefore, out of pocket.  The results have been very good with a 80-95% cure rate.  [facility] is the only practice in the area to offer this therapy. Permanent Nail Avulsion.  Removing the entire nail so that a new nail will not grow back.Diabetic Neuropathy Diabetic neuropathy refers to nerve damage that is caused by diabetes (diabetes mellitus). Over time, people with diabetes can develop nerve damage throughout the body. There are several types of diabetic neuropathy:  Peripheral neuropathy. This is the most common type of diabetic neuropathy. It causes damage to nerves that carry signals between the spinal cord and other parts of the body (peripheral nerves). This usually affects nerves in the feet and legs first, and may eventually affect the hands and arms. The damage affects the ability to sense touch or temperature.  Autonomic neuropathy. This type causes damage to nerves that control involuntary functions (autonomic nerves). These nerves carry   signals that control: ? Heartbeat. ? Body temperature. ? Blood pressure. ? Urination. ? Digestion. ? Sweating. ? Sexual function. ? Response to changing blood sugar (glucose) levels.  Focal neuropathy. This type of nerve damage affects one area of the body, such as an arm, a leg, or the face. The injury may involve one nerve or a small group of nerves. Focal neuropathy can be painful and unpredictable,  and occurs most often in older adults with diabetes. This often develops suddenly, but usually improves over time and does not cause long-term problems.  Proximal neuropathy. This type of nerve damage affects the nerves of the thighs, hips, buttocks, or legs. It causes severe pain, weakness, and muscle death (atrophy), usually in the thigh muscles. It is more common among older men and people who have type 2 diabetes. The length of recovery time may vary. What are the causes? Peripheral, autonomic, and focal neuropathies are caused by diabetes that is not well controlled with treatment. The cause of proximal neuropathy is not known, but it may be caused by inflammation related to uncontrolled blood glucose levels. What are the signs or symptoms? Peripheral neuropathy Peripheral neuropathy develops slowly over time. When the nerves of the feet and legs no longer work, you may experience:  Burning, stabbing, or aching pain in the legs or feet.  Pain or cramping in the legs or feet.  Loss of feeling (numbness) and inability to feel pressure or pain in the feet. This can lead to: ? Thick calluses or sores on areas of constant pressure. ? Ulcers. ? Reduced ability to feel temperature changes.  Foot deformities.  Muscle weakness.  Loss of balance or coordination. Autonomic neuropathy The symptoms of autonomic neuropathy vary depending on which nerves are affected. Symptoms may include:  Problems with digestion, such as: ? Nausea or vomiting. ? Poor appetite. ? Bloating. ? Diarrhea or constipation. ? Trouble swallowing. ? Losing weight without trying to.  Problems with the heart, blood and lungs, such as: ? Dizziness, especially when standing up. ? Fainting. ? Shortness of breath. ? Irregular heartbeat.  Bladder problems, such as: ? Trouble starting or stopping urination. ? Leaking urine. ? Trouble emptying the bladder. ? Urinary tract infections (UTIs).  Problems with other  body functions, such as: ? Sweat. You may sweat too much or too little. ? Temperature. You might get hot easily. Or, you might feel cold more than usual. ? Sexual function. Men may not be able to get or maintain an erection. Women may have vaginal dryness and difficulty with arousal. Focal neuropathy Symptoms affect only one area of the body. Common symptoms include:  Numbness.  Tingling.  Burning pain.  Prickling feeling.  Very sensitive skin.  Weakness.  Inability to move (paralysis).  Muscle twitching.  Muscles getting smaller (wasting).  Poor coordination.  Double or blurred vision. Proximal neuropathy  Sudden, severe pain in the hip, thigh, or buttocks. Pain may spread from the back into the legs (sciatica).  Pain and numbness in the arms and legs.  Tingling.  Loss of bladder control or bowel control.  Weakness and wasting of thigh muscles.  Difficulty getting up from a seated position.  Abdominal swelling.  Unexplained weight loss. How is this diagnosed? Diagnosis usually involves reviewing your medical history and any symptoms you have. Diagnosis varies depending on the type of neuropathy your health care provider suspects. Peripheral neuropathy Your health care provider will check areas that are affected by your nervous system (neurologic exam), such as  your reflexes, how you move, and what you can feel. You may have other tests, such as:  Blood tests.  Removal and examination of fluid that surrounds the spinal cord (lumbar puncture).  CT scan.  MRI.  A test to check the nerves that control muscles (electromyogram, EMG).  Tests of how quickly messages pass through your nerves (nerve conduction velocity tests).  Removal of a small piece of nerve to be examined under a microscope (biopsy). Autonomic neuropathy You may have tests, such as:  Tests to measure your blood pressure and heart rate. This may include monitoring you while you are safely  secured to an exam table that moves you from a lying position to an upright position (table tilt test).  Breathing tests to check your lungs.  Tests to check how food moves through the digestive system (gastric emptying tests).  Blood, sweat, or urine tests.  Ultrasound of your bladder.  Spinal fluid tests. Focal neuropathy This condition may be diagnosed with:  A neurologic exam.  CT scan.  MRI.  EMG.  Nerve conduction velocity tests. Proximal neuropathy There is no test to diagnose this type of neuropathy. You may have tests to rule out other possible causes of this type of neuropathy. Tests may include:  X-rays of your spine and lumbar region.  Lumbar puncture.  MRI. How is this treated? The goal of treatment is to keep nerve damage from getting worse. The most important part of treatment is keeping your blood glucose level and your A1C level within your target range by following your diabetes management plan. Over time, maintaining lower blood glucose levels helps lessen symptoms. In some cases, you may need prescription pain medicine. Follow these instructions at home:  Lifestyle   Do not use any products that contain nicotine or tobacco, such as cigarettes and e-cigarettes. If you need help quitting, ask your health care provider.  Be physically active every day. Include strength training and balance exercises.  Follow a healthy meal plan.  Work with your health care provider to manage your blood pressure. General instructions  Follow your diabetes management plan as directed. ? Check your blood glucose levels as directed by your health care provider. ? Keep your blood glucose in your target range as directed by your health care provider. ? Have your A1C level checked at least two times a year, or as often as told by your health care provider.  Take over the counter and prescription medicines only as told by your health care provider. This includes insulin  and diabetes medicine.  Do not drive or use heavy machinery while taking prescription pain medicines.  Check your skin and feet every day for cuts, bruises, redness, blisters, or sores.  Keep all follow up visits as told by your health care provider. This is important. Contact a health care provider if:  You have burning, stabbing, or aching pain in your legs or feet.  You are unable to feel pressure or pain in your feet.  You develop problems with digestion, such as: ? Nausea. ? Vomiting. ? Bloating. ? Constipation. ? Diarrhea. ? Abdominal pain.  You have difficulty with urination, such as inability: ? To control when you urinate (incontinence). ? To completely empty the bladder (retention).  You have palpitations.  You feel dizzy, weak, or faint when you stand up. Get help right away if:  You cannot urinate.  You have sudden weakness or loss of coordination.  You have trouble speaking.  You have pain or   pressure in your chest.  You have an irregular heart beat.  You have sudden inability to move a part of your body. Summary  Diabetic neuropathy refers to nerve damage that is caused by diabetes. It can affect nerves throughout the entire body, causing numbness and pain in the arms, legs, digestive tract, heart, and other body systems.  Keep your blood glucose level and your blood pressure in your target range, as directed by your health care provider. This can help prevent neuropathy from getting worse.  Check your skin and feet every day for cuts, bruises, redness, blisters, or sores.  Do not use any products that contain nicotine or tobacco, such as cigarettes and e-cigarettes. If you need help quitting, ask your health care provider. This information is not intended to replace advice given to you by your health care provider. Make sure you discuss any questions you have with your health care provider. Document Released: 08/14/2001 Document Revised: 07/18/2017  Document Reviewed: 07/10/2016 [employer]vier Interactive Patient Education  2019 [employer]  

## 2018-07-30 NOTE — Progress Notes (Signed)
Subjective: Rhonda Higgins presents today with history of neuropathy with cc of painful, mycotic toenails.  Pain is aggravated when wearing enclosed shoe gear and relieved with periodic professional debridement.  Patient has peripheral neuropathy managed with gabapentin.  [name] relates no new pedal concerns on today's visit.  [name], MD is her PCP.   Current Outpatient Medications:  .  Ascorbic Acid (VITAMIN C) 1000 MG tablet, Take 1,000 mg by mouth daily., Disp: , Rfl:  .  aspirin EC 81 MG tablet, Take 81 mg by mouth at bedtime. , Disp: , Rfl:  .  buPROPion (WELLBUTRIN XL) 150 MG 24 hr tablet, Take 150 mg by mouth daily. , Disp: , Rfl:  .  cetirizine (ZYRTEC) 10 MG tablet, Take 10 mg by mouth at bedtime. , Disp: , Rfl:  .  dicyclomine (BENTYL) 20 MG tablet, TAKE 1 TABLET BY MOUTH 4 TIMES A DAY AS NEEDED, Disp: , Rfl:  .  gabapentin (NEURONTIN) 400 MG capsule, Take 400-1,200 mg by mouth 3 (three) times daily. Pt takes one capsule in the morning, one in the afternoon, and three at bedtime., Disp: , Rfl:  .  glimepiride (AMARYL) 4 MG tablet, Take 4 mg by mouth 2 (two) times daily. , Disp: , Rfl: 5 .  ibuprofen (ADVIL,MOTRIN) 800 MG tablet, Take 800 mg by mouth every 8 (eight) hours as needed for moderate pain., Disp: , Rfl:  .  lamoTRIgine (LAMICTAL) 200 MG tablet, Take 200 mg by mouth at bedtime. , Disp: , Rfl: 2 .  LANTUS SOLOSTAR 100 UNIT/ML Solostar Pen, Inject 30 Units into the skin at bedtime. , Disp: , Rfl: 0 .  lisinopril (PRINIVIL,ZESTRIL) 5 MG tablet, Take 5 mg by mouth at bedtime. , Disp: , Rfl:  .  magnesium citrate SOLN, TAKE 150ML BY MOUTH TWICE A DAY FOR 1 DAY, Disp: , Rfl:  .  Multiple Vitamins-Minerals (MULTIVITAMIN WITH MINERALS) tablet, Take 1 tablet by mouth every evening. , Disp: , Rfl:  .  ondansetron (ZOFRAN ODT) 4 MG disintegrating tablet, Take 1 tablet (4 mg total) by mouth every 8 (eight) hours as needed for nausea or vomiting., Disp: 15 tablet,  Rfl: 0 .  ondansetron (ZOFRAN) 4 MG tablet, Take 4 mg by mouth 3 (three) times daily as needed., Disp: , Rfl:  .  pantoprazole (PROTONIX) 40 MG tablet, Take 40 mg by mouth every morning. , Disp: , Rfl: 0 .  risperiDONE (RISPERDAL) 2 MG tablet, Take 2 mg by mouth daily. , Disp: , Rfl: 2 .  simvastatin (ZOCOR) 40 MG tablet, Take 40 mg by mouth at bedtime. , Disp: , Rfl:  .  sitaGLIPtin-metformin (JANUMET) 50-1000 MG tablet, Take 1 tablet by mouth 2 (two) times daily with a meal., Disp: , Rfl:  .  SYNTHROID 175 MCG tablet, Take 175 mcg by mouth daily., Disp: , Rfl:  .  traMADol (ULTRAM) 50 MG tablet, Take 1-2 tablets (50-100 mg total) by mouth every 6 (six) hours as needed for moderate pain (mild pain, refractory to tylneol)., Disp: 15 tablet, Rfl: 0 .  VICTOZA 18 MG/3ML SOPN, INJECT 1.2MG  DAILY, Disp: , Rfl:  .  Vitamin D, Ergocalciferol, (DRISDOL) 1.25 MG (50000 UT) CAPS capsule, Take 50,000 Units by mouth once a week., Disp: , Rfl:   Allergies  Allergen Reactions  . Hydrocodone Shortness Of Breath  . Tape Rash    Paper tape is ok  . Hydrocodone-Acetaminophen Itching  . Other Itching  . Codeine Itching  Objective:  Vascular Examination: Capillary refill time immediate x 10 digits Dorsalis pedis and Posterior tibial pulses present bilaterally Digital hair x 10 digits was absent Skin temperature gradient WNL b/l  Dermatological Examination: Skin with normal turgor, texture and tone b/l  Toenails 1-5 b/l discolored, thick, dystrophic with subungual debris and pain with palpation to nailbeds due to thickness of nails.  Musculoskeletal: Muscle strength 5/5 to all muscle groups b/l  Neurological: Sensation with 10 gram monofilament is intact b/l Vibratory sensation intact b/l  Assessment: 1. Painful onychomycosis toenails 1-5 b/l 2. NIDDM with neuropathy  Plan: 1. Toenails 1-5 b/l were debrided in length and girth without iatrogenic bleeding. 2. Patient to continue soft,  supportive shoe gear. 3. Patient to report any pedal injuries to medical professional  4. Follow up 3 months.  5. Patient/POA to call should there be a concern in the interim.

## 2018-08-15 ENCOUNTER — Encounter (HOSPITAL_COMMUNITY): Payer: Self-pay | Admitting: *Deleted

## 2018-08-15 ENCOUNTER — Emergency Department (HOSPITAL_COMMUNITY): Payer: Medicaid Other

## 2018-08-15 ENCOUNTER — Other Ambulatory Visit: Payer: Self-pay

## 2018-08-15 ENCOUNTER — Emergency Department (HOSPITAL_COMMUNITY)
Admission: EM | Admit: 2018-08-15 | Discharge: 2018-08-15 | Disposition: A | Discharge status: Home / Self Care | Payer: Medicaid Other | Attending: Emergency Medicine | Admitting: Emergency Medicine

## 2018-08-15 DIAGNOSIS — Z8542 Personal history of malignant neoplasm of other parts of uterus: Secondary | ICD-10-CM | POA: Insufficient documentation

## 2018-08-15 DIAGNOSIS — Z79899 Other long term (current) drug therapy: Secondary | ICD-10-CM | POA: Insufficient documentation

## 2018-08-15 DIAGNOSIS — M545 Low back pain, unspecified: Secondary | ICD-10-CM

## 2018-08-15 DIAGNOSIS — E890 Postprocedural hypothyroidism: Secondary | ICD-10-CM | POA: Insufficient documentation

## 2018-08-15 DIAGNOSIS — Z7982 Long term (current) use of aspirin: Secondary | ICD-10-CM | POA: Insufficient documentation

## 2018-08-15 DIAGNOSIS — E119 Type 2 diabetes mellitus without complications: Secondary | ICD-10-CM | POA: Diagnosis not present

## 2018-08-15 IMAGING — CR DG LUMBAR SPINE COMPLETE 4+V
5 series · 5 of 5 positions shown · non-contrast
Comparison: CT [DATE]

CLINICAL DATA: Low back pain radiating down left leg

EXAM:
LUMBAR SPINE - COMPLETE 4+ VIEW

[t lumbar spine ap]
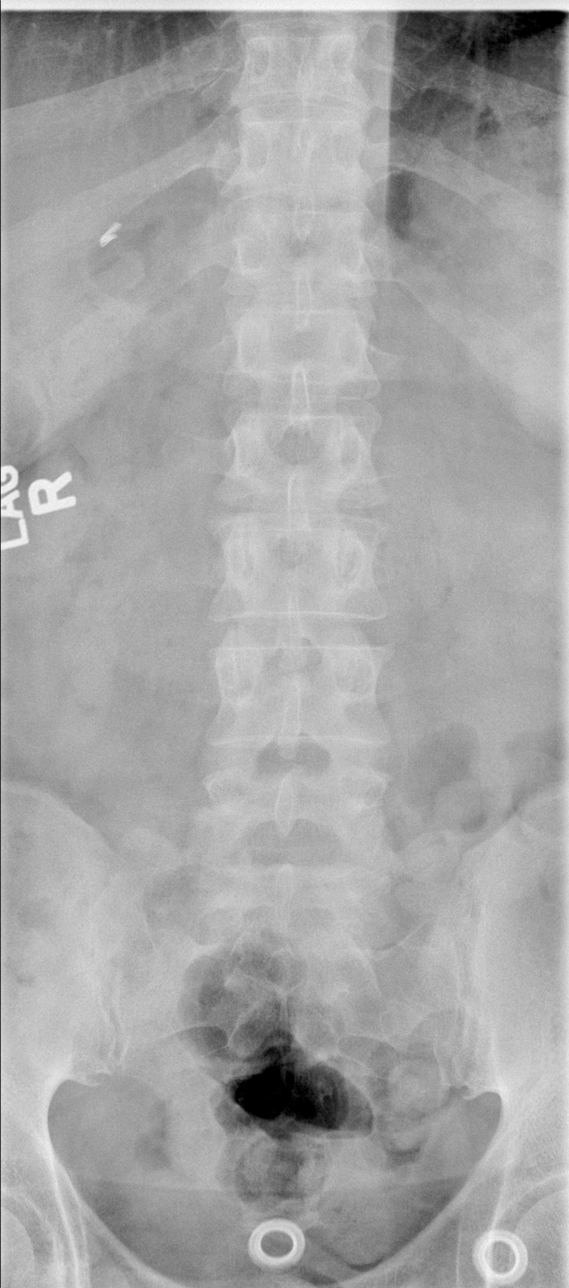

[t lumbar spine obl (1 of 2)]
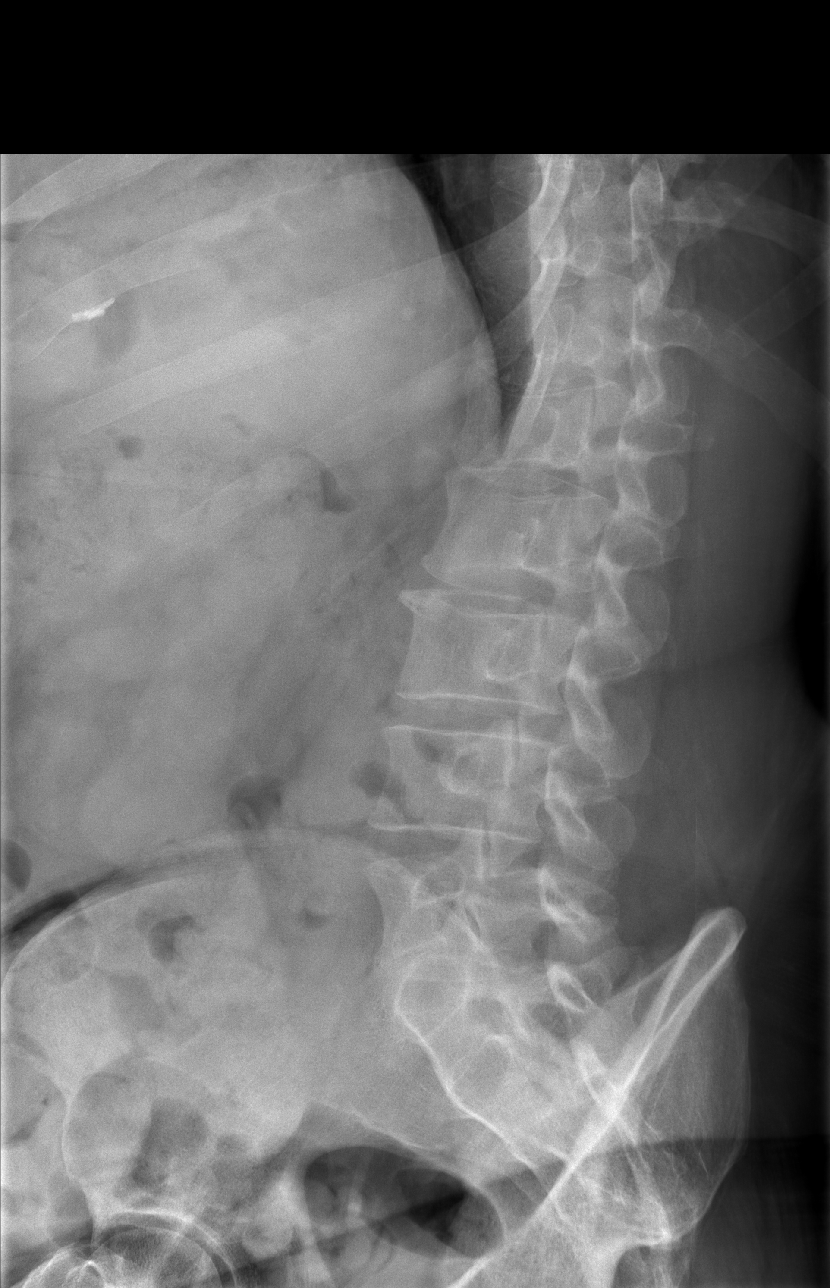

[t lumbar spine obl (2 of 2)]
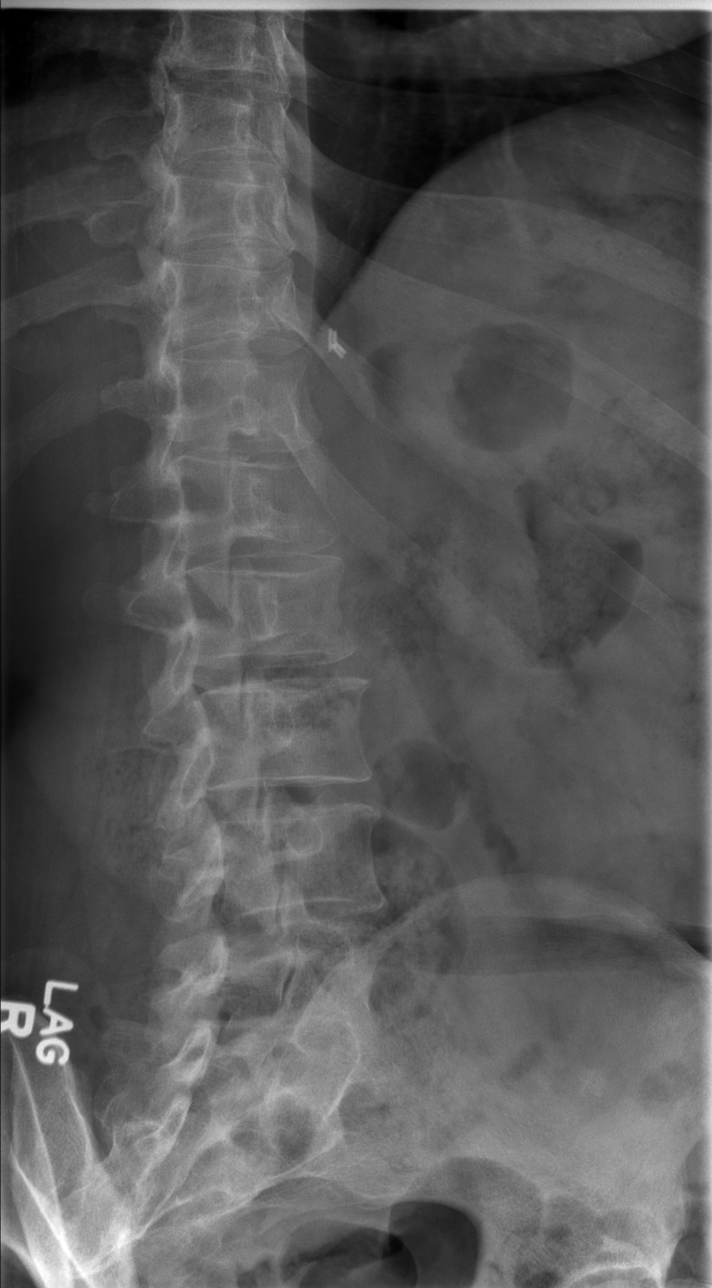

[t lumbar spine lat]
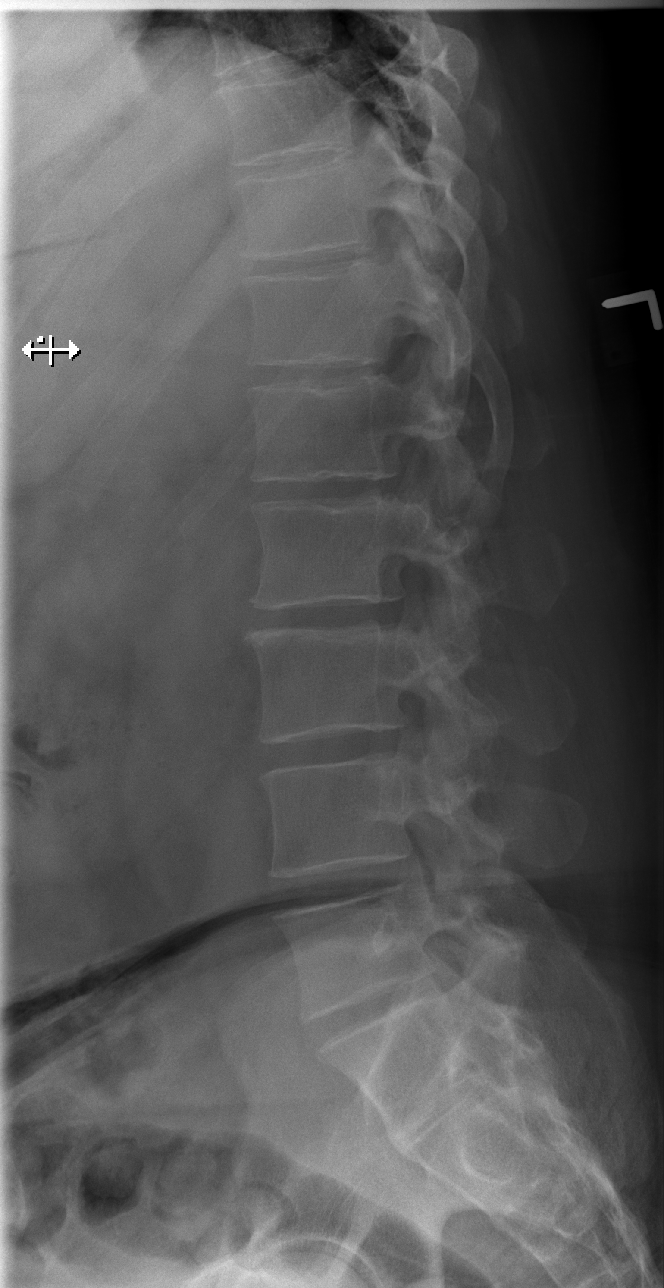

[t lumbar l-5 s-1 spot]
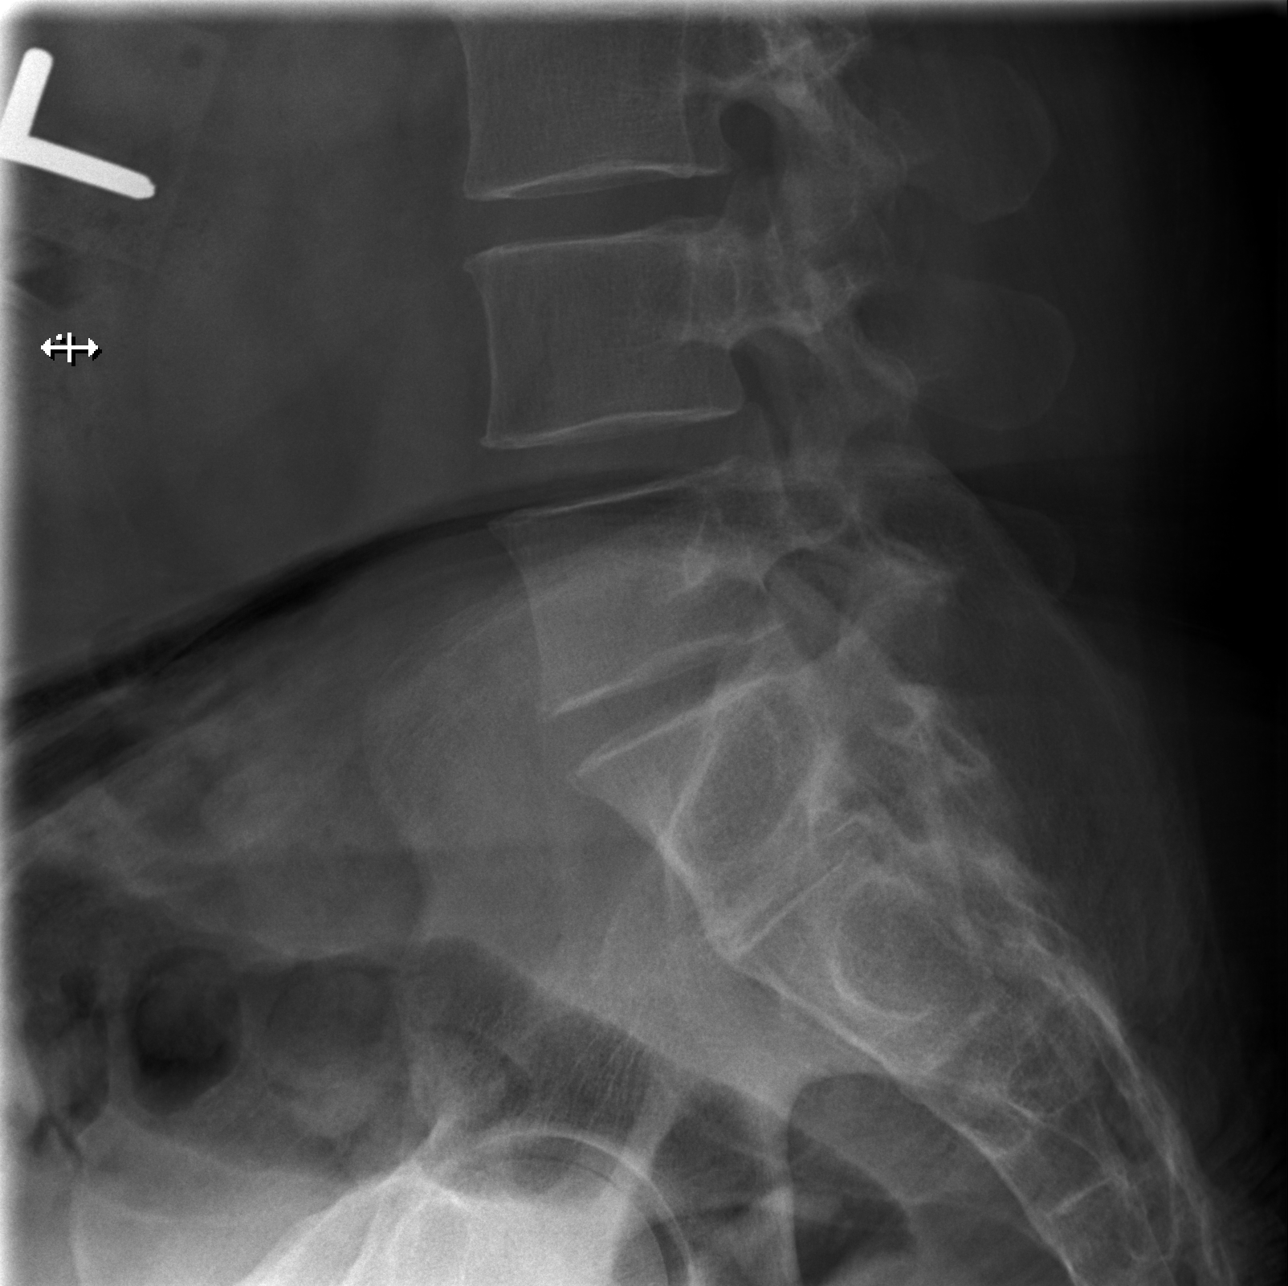

[5 of 5 positions shown; findings below may reference images not displayed]

FINDINGS: There is no evidence of lumbar spine fracture. Alignment is normal.
Intervertebral disc spaces are maintained.
IMPRESSION: Negative.

## 2018-08-15 MED ORDER — METHOCARBAMOL 500 MG PO TABS: 500.0000 mg | ORAL_TABLET | Freq: Two times a day (BID) | ORAL | 0 refills | Status: DC | PRN

## 2018-08-15 MED ORDER — NAPROXEN 500 MG PO TABS: 500.0000 mg | ORAL_TABLET | Freq: Two times a day (BID) | ORAL | 0 refills | Status: DC | PRN

## 2018-08-15 MED ORDER — KETOROLAC TROMETHAMINE 30 MG/ML IJ SOLN
30.0000 mg | Freq: Once | INTRAMUSCULAR | Status: AC
  Administered 2018-08-15: 30 mg via INTRAMUSCULAR
  Filled 2018-08-15: qty 1

## 2018-08-15 NOTE — ED Provider Notes (Signed)
[facility] DEPT Provider Note   CSN: [id number] Arrival date & time: 08/15/18  1921    History   Chief Complaint Chief Complaint  Patient presents with  . Back Pain    HPI Rhonda Higgins is a 48 y.o. female.     The history is provided by the patient and medical records. No language interpreter was used.   Rhonda Higgins is a 48 y.o. female  with a PMH as listed below who presents to the Emergency Department complaining of acute worsening of her chronic low back pain.  Patient reports that her low back hurts constantly, all day every day, but about 3 days ago, she feels as if her pain worsened.  Certain movements will give her a sudden onset sharp pain across the entire low back.  She has been sitting on a heating pad for the last 2 days which has provided little improvement for her.  No medications prior to arrival for symptoms.  She reports that she has seen her primary care doctor and mentioned her back pain in the past, but he always gives her stretches to try and encourages her to lose weight.  She does not feel as if this is helping and would like more to be done for her.  She reports having chronic problems with her back since she was about 48 years old. Patient denies upper back or neck pain. No fever, saddle anesthesia, weakness, numbness, urinary complaints including retention/incontinence. No history of cancer, IVDU, or recent spinal procedures.   Past Medical History:  Diagnosis Date  . Adenocarcinoma (HCC)    endometrial, FIGO GRADE 1  . Allergic rhinitis   . Atypical chest pain    History of  . Depression   . Elevated liver enzymes   . GERD (gastroesophageal reflux disease)   . Hematuria   . History of endometrial cancer 08-02-2009   oncologist-  dr brewster/ Denman George and dr kinard/  no recurrence   endometrial adenocarinoma Stage 1B, Grade 1, FIGO--  s/p  TAH w/ BSO and pelvic lymph node dissection's and radiation therapy   . History of kidney stones   . History of radiation therapy    2011  pelvic intracavity brachytherapy treatment's for endometrial carcinoma  . History of thyroid nodule    multinodular goiter s/p  total thyroidectomy 11-19-2015  per pathology -  adenomatoid nodules  . Hyperlipidemia   . Hypothyroidism, postsurgical   . 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 Left ureteral stone   . Obesity   . OSA (obstructive sleep apnea)    severe OSA  per study 03-08-2010--  noncomplant cpap  . Overactive bladder   . Personality disorder ([place])   . Polyphagia(783.6)   . PONV (postoperative nausea and vomiting)    after ear surgery only one time  . Right lower quadrant pain   . Urgency of urination   . UTI (urinary tract infection)     Patient Active Problem List   Diagnosis Date Noted  . Cholelithiasis with chronic cholecystitis 09/06/2017  . Multinodular goiter (nontoxic) 11/17/2015  . Chest pain 07/20/2014  . Diabetes mellitus without complication ([place]) 25/36/6440  . GERD (gastroesophageal reflux disease) 07/19/2014  . Personality disorder ([place])   . Uterine cancer ([place]) 06/22/2011  . DM 02/09/2010  . HLD (hyperlipidemia) 02/09/2010  . Obstructive sleep apnea 02/09/2010  . Allergic rhinitis 02/09/2010  . History of uterine cancer 02/09/2010    Past Surgical  History:  Procedure Laterality Date  . CARDIOVASCULAR STRESS TEST  06/09/2008   normal nuclear study w/ no ischemia/  normal LV function and wall motion , ef 83%  . CHOLECYSTECTOMY N/A 09/06/2017   Procedure: LAPAROSCOPIC CHOLECYSTECTOMY WITH INTRAOPERATIVE CHOLANGIOGRAM;  Surgeon: Armandina Gemma, MD;  Location: [facility];  Service: General;  Laterality: N/A;  . CYSTOSCOPY/RETROGRADE/URETEROSCOPY/STONE EXTRACTION WITH BASKET Left 03/08/2016   Procedure: CYSTOSCOPY/RETROGRADE/URETEROSCOPY/STONE EXTRACTION WITH BASKET, STENT PLACEMENT;  Surgeon: Rana Snare, MD;  Location: [facility];  Service: Urology;   Laterality: Left;  . ENDOMETRIAL BIOPSY    . ERCP N/A 09/07/2017   Procedure: ENDOSCOPIC RETROGRADE CHOLANGIOPANCREATOGRAPHY (ERCP);  Surgeon: [name], MD;  Location: [facility];  Service: Gastroenterology;  Laterality: N/A;  . HOLMIUM LASER APPLICATION Left 1/61/0960   Procedure: HOLMIUM LASER APPLICATION;  Surgeon: Rana Snare, MD;  Location[facility];  Service: Urology;  Laterality: Left;  . MOUTH SURGERY    . MYRINGECOTMY W/ REMOVAL MIDDLE EAR CHOLESTEATOMA TYPE 1 FASICA TYMPANOPLASTY  09/13/2000  . ROBOTIC ASSISTED TOTAL HYSTERECTOMY WITH BILATERAL SALPINGO OOPHERECTOMY  08-02-2009   at [facility]  dr Denman George   w/  Bilateral pelvic and para aortic lymph node dissection's  . THYROIDECTOMY N/A 11/19/2015   Procedure: TOTAL THYROIDECTOMY;  Surgeon: Armandina Gemma, MD;  Location: [facility];  Service: General;  Laterality: N/A;  . TONSILLECTOMY  age 89  . TRANSTHORACIC ECHOCARDIOGRAM  07/19/2014   ef 55-60%/  trivial TR  . TYMPANOPLASTY Right 1993     OB History    Gravida  0   Para      Term      Preterm      AB      Living        SAB      TAB      Ectopic      Multiple      Live Births               Home Medications    Prior to Admission medications   Medication Sig Start Date End Date Taking? Authorizing Provider  Ascorbic Acid (VITAMIN C) 1000 MG tablet Take 1,000 mg by mouth daily.    [provider]  aspirin EC 81 MG tablet Take 81 mg by mouth at bedtime.     [provider]  buPROPion (WELLBUTRIN XL) 150 MG 24 hr tablet Take 150 mg by mouth daily.     [provider]  cetirizine (ZYRTEC) 10 MG tablet Take 10 mg by mouth at bedtime.     [provider]  dicyclomine (BENTYL) 20 MG tablet TAKE 1 TABLET BY MOUTH 4 TIMES A DAY AS NEEDED 07/09/18   [provider]  gabapentin (NEURONTIN) 400 MG capsule Take 400-1,200 mg by mouth 3 (three) times daily. Pt takes one capsule in the morning, one in the afternoon,  and three at bedtime.    [provider]  glimepiride (AMARYL) 4 MG tablet Take 4 mg by mouth 2 (two) times daily.     [provider]  ibuprofen (ADVIL,MOTRIN) 800 MG tablet Take 800 mg by mouth every 8 (eight) hours as needed for moderate pain.    [provider]  lamoTRIgine (LAMICTAL) 200 MG tablet Take 200 mg by mouth at bedtime.     [provider]  LANTUS SOLOSTAR 100 UNIT/ML Solostar Pen Inject 30 Units into the skin at bedtime.  11/03/16   [provider]  lisinopril (PRINIVIL,ZESTRIL) 5 MG tablet  Take 5 mg by mouth at bedtime.     [provider]  magnesium citrate SOLN TAKE 150ML BY MOUTH TWICE A DAY FOR 1 DAY 07/16/18   [provider]  methocarbamol (ROBAXIN) 500 MG tablet Take 1 tablet (500 mg total) by mouth 2 (two) times daily as needed (back pain, muscle aches). 08/15/18   Ward, Ozella Almond, PA-C  Multiple Vitamins-Minerals (MULTIVITAMIN WITH MINERALS) tablet Take 1 tablet by mouth every evening.     [provider]  naproxen (NAPROSYN) 500 MG tablet Take 1 tablet (500 mg total) by mouth 2 (two) times daily as needed for mild pain or moderate pain. 08/15/18   Ward, Ozella Almond, PA-C  ondansetron (ZOFRAN ODT) 4 MG disintegrating tablet Take 1 tablet (4 mg total) by mouth every 8 (eight) hours as needed for nausea or vomiting. 01/18/18   Street, Mercedes, PA-C  ondansetron (ZOFRAN) 4 MG tablet Take 4 mg by mouth 3 (three) times daily as needed. 07/09/18   [provider]  pantoprazole (PROTONIX) 40 MG tablet Take 40 mg by mouth every morning.     [provider]  risperiDONE (RISPERDAL) 2 MG tablet Take 2 mg by mouth daily.  08/22/16   [provider]  simvastatin (ZOCOR) 40 MG tablet Take 40 mg by mouth at bedtime.     [provider]  sitaGLIPtin-metformin (JANUMET) 50-1000 MG tablet Take 1 tablet by mouth 2 (two) times daily with a meal.    [provider]  SYNTHROID 175  MCG tablet Take 175 mcg by mouth daily. 07/10/18   [provider]  traMADol (ULTRAM) 50 MG tablet Take 1-2 tablets (50-100 mg total) by mouth every 6 (six) hours as needed for moderate pain (mild pain, refractory to tylneol). 09/07/17   Armandina Gemma, MD  VICTOZA 18 MG/3ML SOPN INJECT 1.2MG  DAILY 07/24/18   [provider]  Vitamin D, Ergocalciferol, (DRISDOL) 1.25 MG (50000 UT) CAPS capsule Take 50,000 Units by mouth once a week. 07/02/18   [provider]    Family History Family History  Problem Relation Age of Onset  . Heart disease Sister   . Heart attack Brother   . Heart disease Brother   . Heart disease Sister   . Diabetes Sister   . Asthma Mother   . Heart disease Mother   . Diabetes Mother   . Emphysema Mother   . Hypertension Mother   . Stroke Mother   . Prostate cancer Father     Social History Social History   Tobacco Use  . Smoking status: Never Smoker  . Smokeless tobacco: Never Used  Substance Use Topics  . Alcohol use: No  . Drug use: No     Allergies   Hydrocodone; Tape; Hydrocodone-acetaminophen; Other; and Codeine   Review of Systems Review of Systems  Constitutional: Negative for fever.  Gastrointestinal: Negative for abdominal pain, nausea and vomiting.  Genitourinary: Negative for difficulty urinating.  Musculoskeletal: Positive for back pain. Negative for neck pain.  Neurological: Negative for weakness and numbness.     Physical Exam Updated Vital Signs BP 125/68 (BP Location: Left Arm)   Pulse (!) 101   Temp 98.7 F (37.1 C) (Oral)   Resp 16   Ht 5\' 3"  (1.6 m)   Wt 104.3 kg   SpO2 100%   BMI 40.74 kg/m   Physical Exam Vitals signs and nursing note reviewed.  Constitutional:      Appearance: She is well-developed.  Neck:  Comments: No midline or paraspinal tenderness. Full ROM without pain. Cardiovascular:     Rate and Rhythm: Normal rate and regular rhythm.     Heart sounds: Normal heart sounds.    Pulmonary:     Effort: Pulmonary effort is normal. No respiratory distress.     Breath sounds: Normal breath sounds.  Abdominal:     General: Bowel sounds are normal. There is no distension.     Palpations: Abdomen is soft.     Tenderness: There is no abdominal tenderness.  Musculoskeletal:       Arms:     Comments: Diffuse tenderness across the low back as depicted in image.  5/5 muscle strength in bilateral LE's. Straight leg raises are negative bilaterally for radicular symptoms.  Able to ambulate independently with steady gait.  Skin:    General: Skin is warm and dry.     Findings: No erythema or rash.  Neurological:     Mental Status: She is alert and oriented to person, place, and time.     Deep Tendon Reflexes: Reflexes are normal and symmetric.     Comments: Bilateral lower extremities neurovascularly intact.      ED Treatments / Results  Labs (all labs ordered are listed, but only abnormal results are displayed) Labs Reviewed - No data to display  EKG None  Radiology Dg Lumbar Spine Complete  Result Date: 08/15/2018 CLINICAL DATA:  Low back pain radiating down left leg EXAM: LUMBAR SPINE - COMPLETE 4+ VIEW COMPARISON:  CT 01/18/2018 FINDINGS: There is no evidence of lumbar spine fracture. Alignment is normal. Intervertebral disc spaces are maintained. IMPRESSION: Negative. Electronically Signed   By: Rolm Baptise M.D.   On: 08/15/2018 21:00    Procedures Procedures (including critical care time)  Medications Ordered in ED Medications  ketorolac (TORADOL) 30 MG/ML injection 30 mg (30 mg Intramuscular Given 08/15/18 2100)     Initial Impression / Assessment and Plan / ED Course  I have reviewed the triage vital signs and the nursing notes.  Pertinent labs & imaging results that were available during my care of the patient were reviewed by me and considered in my medical decision making (see chart for details).       Rhonda Higgins is a 48 y.o.  female who presents to ED for acute worsening of her chronic low back pain.   Patient demonstrates no lower extremity weakness, saddle anesthesia, bowel or bladder incontinence or neuro deficits. No concern for cauda equina. No fevers or other infectious symptoms to suggest that the patient's back pain is due to an infection. Lower extremities are neurovascularly intact and patient is ambulating without difficulty. Plain will without abnormalities. I have reviewed return precautions, including the development of any of these signs or symptoms and the patient has voiced understanding. I reviewed symptomatic home care instructions.  She reports seeing [facility] recently for ankle pain.  Does not want to follow-up with her primary care doctor for her back pain because he continues to recommend stretches and weight loss.  She feels more recommendations need to be made.  Informed her that she could call the orthopedist group she has seen in the past and follow-up with them for this instead if she would like, however her primary care doctor is most likely quite capable of managing her low back pain at this juncture. Patient voiced understanding and agreement with plan. All questions answered.   Final Clinical Impressions(s) / ED Diagnoses   Final diagnoses:  Bilateral low back pain without sciatica, unspecified chronicity    ED Discharge Orders         Ordered    methocarbamol (ROBAXIN) 500 MG tablet  2 times daily PRN     08/15/18 2123    naproxen (NAPROSYN) 500 MG tablet  2 times daily PRN     08/15/18 2123           Ward, Ozella Almond, PA-C 08/15/18 2131    Fredia Sorrow, MD 08/18/18 1324

## 2018-08-15 NOTE — ED Triage Notes (Signed)
Pt stated "I have back pain all the time but it's been worse for the past week.  Took ibuprofen last night.  I see Dr. [name].

## 2018-08-15 NOTE — Discharge Instructions (Signed)
It was my pleasure taking care of you today!   You have been seen in the Emergency Department today for back pain.   Naproxen as needed for pain. Robaxin is your muscle relaxer to take as needed. In addition to this, use ice and/or heat for additional pain relief. Follow up with the orthopedic clinic - call tomorrow to schedule an appointment.  COLD THERAPY DIRECTIONS:  Ice or gel packs can be used to reduce both pain and swelling. Ice is the most helpful within the first 24 to 48 hours after an injury or flareup from overusing a muscle or joint.  Ice is effective, has very few side effects, and is safe for most people to use.    Return to the ED for worsening back pain, fever, weakness or numbness of either leg, or if you develop either (1) an inability to urinate or have bowel movements, or (2) loss of your ability to control your bathroom functions (if you start having "accidents"), or if you develop other new symptoms that concern you.

## 2018-09-06 ENCOUNTER — Ambulatory Visit (INDEPENDENT_AMBULATORY_CARE_PROVIDER_SITE_OTHER): Payer: Medicaid Other | Admitting: Orthopedic Surgery

## 2018-09-06 ENCOUNTER — Telehealth (INDEPENDENT_AMBULATORY_CARE_PROVIDER_SITE_OTHER): Payer: Self-pay | Admitting: *Deleted

## 2018-09-06 NOTE — Telephone Encounter (Signed)
Asked COVID-19 pre screening questions to patient and they answered no to all. 

## 2018-09-09 ENCOUNTER — Ambulatory Visit (INDEPENDENT_AMBULATORY_CARE_PROVIDER_SITE_OTHER): Payer: Medicaid Other | Admitting: Orthopedic Surgery

## 2018-09-09 ENCOUNTER — Encounter (INDEPENDENT_AMBULATORY_CARE_PROVIDER_SITE_OTHER): Payer: Self-pay | Admitting: Orthopedic Surgery

## 2018-09-09 ENCOUNTER — Other Ambulatory Visit: Payer: Self-pay

## 2018-09-09 DIAGNOSIS — M545 Low back pain, unspecified: Secondary | ICD-10-CM

## 2018-09-09 DIAGNOSIS — E119 Type 2 diabetes mellitus without complications: Secondary | ICD-10-CM | POA: Diagnosis not present

## 2018-09-09 DIAGNOSIS — E785 Hyperlipidemia, unspecified: Secondary | ICD-10-CM | POA: Diagnosis not present

## 2018-09-09 MED ORDER — TRAMADOL HCL 50 MG PO TABS: ORAL_TABLET | ORAL | 0 refills | Status: DC

## 2018-09-09 NOTE — Progress Notes (Signed)
Office Visit Note   Patient: Aleisa Howk Applegarth           Date of Birth: 1971-03-14           MRN: [id number] Visit Date: 09/09/2018 Requested by: [name], MD [address] [place], [place] [address] PCP: [name], MD  Subjective: Chief Complaint  Patient presents with  . Lower Back - Pain    HPI: Rhonda Higgins is a patient with back pain.  Denies any history of injury.  She is had pain since 08/12/2018.  Went to the emergency room.  She is actually had a history of years of back pain which flares up every now and then.  This is not the worst is ever been.  She is been taking naproxen and muscle relaxer without relief.  She does report some leg pain which radiates down the anterior left leg.  Denies any numbness and tingling.  When she walks her symptoms are worse.  Denies any fevers or chills.  Pain does not wake her from sleep at night              ROS: All systems reviewed are negative as they relate to the chief complaint within the history of present illness.  Patient denies  fevers or chills.   Assessment & Plan: Visit Diagnoses:  1. Low back pain, unspecified back pain laterality, unspecified chronicity, unspecified whether sciatica present     Plan: Impression is low back pain without definite radiculopathy and with normal radiographs.  She might be having a little bit of left-sided radiculopathy.  She does have diabetes and is on insulin so a Medrol Dosepak is not a great option.  I will try her with tramadol and physical therapy at [facility].  She is to call me in about 2 to 3 weeks if she is not better we can get MRI scan then and then forward her on for epidural steroid injections if her symptoms are not improving.  No weakness in the legs today.  Follow-Up Instructions: Return if symptoms worsen or fail to improve.   Orders:  Orders Placed This Encounter  Procedures  . Ambulatory referral to Physical Therapy   Meds ordered this encounter  Medications  . traMADol  (ULTRAM) 50 MG tablet    Sig: 1 po q 8 hrs prn pain    Dispense:  35 tablet    Refill:  0      Procedures: No procedures performed   Clinical Data: No additional findings.  Objective: Vital Signs: There were no vitals taken for this visit.  Physical Exam:   Constitutional: Patient appears well-developed HEENT:  Head: Normocephalic Eyes:EOM are normal Neck: Normal range of motion Cardiovascular: Normal rate Pulmonary/chest: Effort normal Neurologic: Patient is alert Skin: Skin is warm Psychiatric: Patient has normal mood and affect    Ortho Exam: Ortho exam demonstrates full active and passive range of motion of both knees hips and ankle.  No groin pain with internal X rotation of the leg.  Patient has palpable pedal pulses.  Patient has no definite paresthesias L1 S1 bilaterally and symmetric reflexes 0 to 1+ out of 4 bilateral patella and Achilles.  Not much pain with forward lateral bending and the skin on the back region is intact without rashes  Specialty Comments:  No specialty comments available.  Imaging: No results found.   PMFS History: Patient Active Problem List   Diagnosis Date Noted  . Cholelithiasis with chronic cholecystitis 09/06/2017  . Multinodular goiter (nontoxic)  11/17/2015  . Chest pain 07/20/2014  . Diabetes mellitus without complication ([place]) 14/97/0263  . GERD (gastroesophageal reflux disease) 07/19/2014  . Personality disorder ([place])   . Uterine cancer ([place]) 06/22/2011  . DM 02/09/2010  . HLD (hyperlipidemia) 02/09/2010  . Obstructive sleep apnea 02/09/2010  . Allergic rhinitis 02/09/2010  . History of uterine cancer 02/09/2010   Past Medical History:  Diagnosis Date  . Adenocarcinoma (HCC)    endometrial, FIGO GRADE 1  . Allergic rhinitis   . Atypical chest pain    History of  . Depression   . Elevated liver enzymes   . GERD (gastroesophageal reflux disease)   . Hematuria   . History of endometrial cancer 08-02-2009    oncologist-  dr brewster/ Denman George and dr kinard/  no recurrence   endometrial adenocarinoma Stage 1B, Grade 1, FIGO--  s/p  TAH w/ BSO and pelvic lymph node dissection's and radiation therapy  . History of kidney stones   . History of radiation therapy    2011  pelvic intracavity brachytherapy treatment's for endometrial carcinoma  . History of thyroid nodule    multinodular goiter s/p  total thyroidectomy 11-19-2015  per pathology -  adenomatoid nodules  . Hyperlipidemia   . Hypothyroidism, postsurgical   . Insulin dependent diabetes mellitus ([place])    Type 2  . Left ureteral stone   . Obesity   . OSA (obstructive sleep apnea)    severe OSA  per study 03-08-2010--  noncomplant cpap  . Overactive bladder   . Personality disorder ([place])   . Polyphagia(783.6)   . PONV (postoperative nausea and vomiting)    after ear surgery only one time  . Right lower quadrant pain   . Urgency of urination   . UTI (urinary tract infection)     Family History  Problem Relation Age of Onset  . Heart disease Sister   . Heart attack Brother   . Heart disease Brother   . Heart disease Sister   . Diabetes Sister   . Asthma Mother   . Heart disease Mother   . Diabetes Mother   . Emphysema Mother   . Hypertension Mother   . Stroke Mother   . Prostate cancer Father     Past Surgical History:  Procedure Laterality Date  . CARDIOVASCULAR STRESS TEST  06/09/2008   normal nuclear study w/ no ischemia/  normal LV function and wall motion , ef 83%  . CHOLECYSTECTOMY N/A 09/06/2017   Procedure: LAPAROSCOPIC CHOLECYSTECTOMY WITH INTRAOPERATIVE CHOLANGIOGRAM;  Surgeon: Armandina Gemma, MD;  Location: [facility];  Service: General;  Laterality: N/A;  . CYSTOSCOPY/RETROGRADE/URETEROSCOPY/STONE EXTRACTION WITH BASKET Left 03/08/2016   Procedure: CYSTOSCOPY/RETROGRADE/URETEROSCOPY/STONE EXTRACTION WITH BASKET, STENT PLACEMENT;  Surgeon: Rana Snare, MD;  Location: [facility];  Service: Urology;   Laterality: Left;  . ENDOMETRIAL BIOPSY    . ERCP N/A 09/07/2017   Procedure: ENDOSCOPIC RETROGRADE CHOLANGIOPANCREATOGRAPHY (ERCP);  Surgeon: [name], MD;  Location: [facility];  Service: Gastroenterology;  Laterality: N/A;  . HOLMIUM LASER APPLICATION Left 7/85/8850   Procedure: HOLMIUM LASER APPLICATION;  Surgeon: Rana Snare, MD;  Location: [facility];  Service: Urology;  Laterality: Left;  . MOUTH SURGERY    . MYRINGECOTMY W/ REMOVAL MIDDLE EAR CHOLESTEATOMA TYPE 1 FASICA TYMPANOPLASTY  09/13/2000  . ROBOTIC ASSISTED TOTAL HYSTERECTOMY WITH BILATERAL SALPINGO OOPHERECTOMY  08-02-2009   at [facility]  dr Denman George   w/  Bilateral pelvic and para aortic lymph node dissection's  . THYROIDECTOMY N/A  11/19/2015   Procedure: TOTAL THYROIDECTOMY;  Surgeon: Armandina Gemma, MD;  Location: [facility];  Service: General;  Laterality: N/A;  . TONSILLECTOMY  age 54  . TRANSTHORACIC ECHOCARDIOGRAM  07/19/2014   ef 55-60%/  trivial TR  . TYMPANOPLASTY Right 1993   Social History   Occupational History  . Occupation: n/a  Tobacco Use  . Smoking status: Never Smoker  . Smokeless tobacco: Never Used  Substance and Sexual Activity  . Alcohol use: No  . Drug use: No  . Sexual activity: Yes    Birth control/protection: None

## 2018-09-18 ENCOUNTER — Ambulatory Visit: Payer: Medicaid Other | Attending: Orthopedic Surgery | Admitting: Physical Therapy

## 2018-09-18 ENCOUNTER — Encounter: Payer: Self-pay | Admitting: Physical Therapy

## 2018-09-18 ENCOUNTER — Other Ambulatory Visit: Payer: Self-pay

## 2018-09-18 DIAGNOSIS — G8929 Other chronic pain: Secondary | ICD-10-CM | POA: Diagnosis present

## 2018-09-18 DIAGNOSIS — M545 Low back pain, unspecified: Secondary | ICD-10-CM

## 2018-09-18 NOTE — Therapy (Signed)
[place], [place], [address] Phone: [phone]   Fax:  [phone]  Physical Therapy Evaluation  Patient Details  Name: Rhonda Higgins MRN: [id number] Date of Birth: 1971-04-16 Referring Provider (PT): Marlou Sa Tonna Corner, MD   Encounter Date: 09/18/2018  PT End of Session - 09/18/18 1501    Visit Number  1    Number of Visits  4    Date for PT Re-Evaluation  12/11/18   longer POC due to office being closed indefinitely due to COVID 19   Authorization Type  MCD, resubmit after 4 visits, she is intrested in telehealth    PT Start Time  1235    PT Stop Time  1320    PT Time Calculation (min)  45 min    Activity Tolerance  Patient tolerated treatment well    Behavior During Therapy  [facility] for tasks assessed/performed       Past Medical History:  Diagnosis Date  . Adenocarcinoma (HCC)    endometrial, FIGO GRADE 1  . Allergic rhinitis   . Atypical chest pain    History of  . Depression   . Elevated liver enzymes   . GERD (gastroesophageal reflux disease)   . Hematuria   . History of endometrial cancer 08-02-2009   oncologist-  dr brewster/ Denman George and dr kinard/  no recurrence   endometrial adenocarinoma Stage 1B, Grade 1, FIGO--  s/p  TAH w/ BSO and pelvic lymph node dissection's and radiation therapy  . History of kidney stones   . History of radiation therapy    2011  pelvic intracavity brachytherapy treatment's for endometrial carcinoma  . History of thyroid nodule    multinodular goiter s/p  total thyroidectomy 11-19-2015  per pathology -  adenomatoid nodules  . Hyperlipidemia   . Hypothyroidism, postsurgical   . 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 Left ureteral stone   . Obesity   . OSA (obstructive sleep apnea)    severe OSA  per study 03-08-2010--  noncomplant cpap  . Overactive bladder   . Personality disorder ([place])   . Polyphagia(783.6)   . PONV  (postoperative nausea and vomiting)    after ear surgery only one time  . Right lower quadrant pain   . Urgency of urination   . UTI (urinary tract infection)     Past Surgical History:  Procedure Laterality Date  . CARDIOVASCULAR STRESS TEST  06/09/2008   normal nuclear study w/ no ischemia/  normal LV function and wall motion , ef 83%  . CHOLECYSTECTOMY N/A 09/06/2017   Procedure: LAPAROSCOPIC CHOLECYSTECTOMY WITH INTRAOPERATIVE CHOLANGIOGRAM;  Surgeon: Armandina Gemma, MD;  Location: [facility];  Service: General;  Laterality: N/A;  . CYSTOSCOPY/RETROGRADE/URETEROSCOPY/STONE EXTRACTION WITH BASKET Left 03/08/2016   Procedure: CYSTOSCOPY/RETROGRADE/URETEROSCOPY/STONE EXTRACTION WITH BASKET, STENT PLACEMENT;  Surgeon: Rana Snare, MD;  Location: [facility];  Service: Urology;  Laterality: Left;  . ENDOMETRIAL BIOPSY    . ERCP N/A 09/07/2017   Procedure: ENDOSCOPIC RETROGRADE CHOLANGIOPANCREATOGRAPHY (ERCP);  Surgeon: [name], MD;  Location: [facility];  Service: Gastroenterology;  Laterality: N/A;  . HOLMIUM LASER APPLICATION Left 2/83/6629   Procedure: HOLMIUM LASER APPLICATION;  Surgeon: Rana Snare, MD;  Location: [facility];  Service: Urology;  Laterality: Left;  . MOUTH SURGERY    . MYRINGECOTMY W/ REMOVAL MIDDLE EAR CHOLESTEATOMA TYPE 1 FASICA TYMPANOPLASTY  09/13/2000  . ROBOTIC ASSISTED TOTAL HYSTERECTOMY WITH BILATERAL SALPINGO OOPHERECTOMY  08-02-2009   at [facility]  dr Denman George   w/  Bilateral pelvic and para aortic lymph node dissection's  . THYROIDECTOMY N/A 11/19/2015   Procedure: TOTAL THYROIDECTOMY;  Surgeon: Armandina Gemma, MD;  Location: [facility];  Service: General;  Laterality: N/A;  . TONSILLECTOMY  age 48  . TRANSTHORACIC ECHOCARDIOGRAM  07/19/2014   ef 55-60%/  trivial TR  . TYMPANOPLASTY Right 1993    There were no vitals filed for this visit.   Subjective Assessment - 09/18/18 1238    Subjective  Pt reports acute on chronic back pain that  flared up a month ago. She now is not able to lay in bed to sleeps siting up on couch. She has pain and difficulty with housework such as dishes, standing or walking longer than 10 min. She does relay relief with heat, and sitting.     Limitations  Sitting;Lifting;Standing;Walking    How long can you sit comfortably?  not limited    How long can you stand comfortably?  10 min    How long can you walk comfortably?  10 min    Diagnostic tests  normal XR    Patient Stated Goals  keep the pain down    Currently in Pain?  Yes    Pain Score  5     Pain Location  Back    Pain Orientation  Lower    Pain Descriptors / Indicators  Aching;Sharp    Pain Type  Acute pain   acute on chronic   Pain Radiating Towards  Lt lateral leg down to knee that is intermitt    Pain Onset  1 to 4 weeks ago    Pain Frequency  Constant   intermitt leg radic   Aggravating Factors   standing or bending over    Pain Relieving Factors  heat, sitting    Effect of Pain on Daily Activities  limits standing ADl's         [facility] PT Assessment - 09/18/18 0001      Assessment   Medical Diagnosis  LBP    Referring Provider (PT)  Marlou Sa, Tonna Corner, MD    Onset Date/Surgical Date  --   acute pain over last month, history of chronic LBP   Next MD Visit  not scheduled but says MD wants her to call back in 2-3 weeks to make one if PT does not help and then MRI    Prior Therapy  PT in past for ankle Fx      Precautions   Precautions  None      Restrictions   Weight Bearing Restrictions  No      Balance Screen   Has the patient fallen in the past 6 months  No      [place] residence    Type of Home  Apartment    Additional Comments  one flight of stairs she can do without limitations in back pain      Prior Function   Level of Independence  Independent    Vocation  --   housewife   Leisure  going out to dinner and cooking      Cognition   Overall Cognitive Status  Within  Functional Limits for tasks assessed      Observation/Other Assessments   Focus on Therapeutic Outcomes (FOTO)   not done, MCD      Sensation   Light Touch  Appears Intact  Coordination   Gross Motor Movements are Fluid and Coordinated  Yes      Posture/Postural Control   Posture Comments  slumped posture      ROM / Strength   AROM / PROM / Strength  AROM;Strength      AROM   Overall AROM Comments  available    AROM Assessment Site  Lumbar    Lumbar Flexion  25%    Lumbar Extension  10%    Lumbar - Right Side Bend  50%    Lumbar - Left Side Bend  50%    Lumbar - Right Rotation  50%    Lumbar - Left Rotation  50%      Strength   Overall Strength Comments  LE strength overall 4+/5 MMT except 4/5 Lt hip flexion all tested grossly tested in sitting, poor trunk strength      Flexibility   Soft Tissue Assessment /Muscle Length  --   tight H.S, lumbar P.S     Palpation   Spinal mobility  difficult to assess as she could not lay down long to get full assessment    Palpation comment  TTP lumbar P.S.      Special Tests   Other special tests  mostly neg slump test, + SLR test bilat      Transfers   Transfers  Independent with all Transfers      Ambulation/Gait   Gait Comments  slower gait but overall WFL                Objective measurements completed on examination: See above findings.      [facility] Adult PT Treatment/Exercise - 09/18/18 0001      Modalities   Modalities  Electrical Stimulation;Moist Heat      Moist Heat Therapy   Number Minutes Moist Heat  15 Minutes    Moist Heat Location  Lumbar Spine      Electrical Stimulation   Electrical Stimulation Location  15    Electrical Stimulation Action  IFC    Electrical Stimulation Parameters  tolerance, pt sitting    Electrical Stimulation Goals  Tone;Pain             PT Education - 09/18/18 1500    Education Details  HEP, POC, TENS    Person(s) Educated  Patient    Methods   Explanation;Demonstration;Verbal cues;Handout    Comprehension  Verbalized understanding       PT Short Term Goals - 09/18/18 1510      PT SHORT TERM GOAL #1   Title  Pt will be I and compliant with HEP 4 weeks 10/16/18    Baseline  no HEP until today    Status  New      PT SHORT TERM GOAL #2   Title  Pt will report being able to stand at least 15 min before pain increases more than 3 increments or less than 8/10 pain. 4 weeks 10/16/18    Baseline  8/10 pain after standing 10 min    Status  New        PT Long Term Goals - 09/18/18 1515      PT LONG TERM GOAL #1   Title  Pt will be able to stand at least 20 min for ADL's with less than 5/10 pain. 12 weeks 12/11/18    Baseline  8/10 pain standing for 10 min    Status  New      PT LONG TERM GOAL #2  Title  Pt will improve lumbar ROM to [facility] (at least 60% available). 12 weeks 12/11/18    Baseline  25% flexion, 10% extension, 50% rotation    Status  New             Plan - 09/18/18 1503    Clinical Impression Statement  Pt presents with acute on chronic LBP. This appears mostly muscular in nature and she had neg XR although she does have intermittent Lt leg radiculopathy and disc pathology can't be ruled out at this time. She has overall decreased strength, decreased ROM, decreased standing tolerance for ADL's, and increased back pain limiting her funcion. She will benefit from skiled PT to address her defecits.     Personal Factors and Comorbidities  Time since onset of injury/illness/exacerbation;Fitness;Comorbidity 1;Comorbidity 2;Past/Current Experience;Comorbidity 3+    Comorbidities  DM, Sleep apnea, obesity    Examination-Activity Limitations  Bend;Carry;Squat;Stand;Lift    Examination-Participation Restrictions  Cleaning;Meal Prep;Laundry    Stability/Clinical Decision Making  Evolving/Moderate complexity    Clinical Decision Making  Moderate    Rehab Potential  Fair    PT Frequency  1x / week    PT Duration  12 weeks     PT Treatment/Interventions  Cryotherapy;Electrical Stimulation;Moist Heat;Traction;Ultrasound;Therapeutic activities;Therapeutic exercise;Neuromuscular re-education;Manual techniques;Passive range of motion;Dry needling;Taping;Spinal Manipulations;Joint Manipulations    PT Next Visit Plan  review HEP, needs lumbar stretching and core strength, consider modalaties, MT, traction    PT Home Exercise Plan  sitting H.S stretch, piriformis stretch, sidelying trunk rotation, SKTC, standing lumbar ext    Consulted and Agree with Plan of Care  Patient       Patient will benefit from skilled therapeutic intervention in order to improve the following deficits and impairments:  Decreased activity tolerance, Decreased endurance, Decreased range of motion, Decreased strength, Hypomobility, Pain, Postural dysfunction, Obesity  Visit Diagnosis: Chronic bilateral low back pain, unspecified whether sciatica present     Problem List Patient Active Problem List   Diagnosis Date Noted  . Cholelithiasis with chronic cholecystitis 09/06/2017  . Multinodular goiter (nontoxic) 11/17/2015  . Chest pain 07/20/2014  . Diabetes mellitus without complication ([place]) 28/36/6294  . GERD (gastroesophageal reflux disease) 07/19/2014  . Personality disorder ([place])   . Uterine cancer ([place]) 06/22/2011  . DM 02/09/2010  . HLD (hyperlipidemia) 02/09/2010  . Obstructive sleep apnea 02/09/2010  . Allergic rhinitis 02/09/2010  . History of uterine cancer 02/09/2010    Silvestre Mesi 09/18/2018, 3:19 PM  [facility] [address] [place], [place], [address] Phone: [phone]   Fax:  [phone]  Name: Jiyah Torpey Higgins MRN: [id number] Date of Birth: Nov 13, 1970

## 2018-09-18 NOTE — Patient Instructions (Addendum)
Access Code: [email or web address]  URL: [email or web address]  Date: 09/18/2018  Prepared by: Elsie Ra   Exercises  Seated Hamstring Stretch - 3 reps - 1 sets - 30 hold - 2x daily - 6x weekly  Seated Piriformis Stretch - 3 reps - 1 sets - 30 hold - 2x daily - 6x weekly  Hooklying Single Knee to Chest - 3 sets - 30 hold - 2x daily - 6x weekly  Standing Back Extension - 10 reps - 3 sets - 2x daily - 6x weekly  Modified Sidelying Thoracic Rotation Arm in Front - 10 reps - 3 sets - 2x daily - 6x weekly    TENS UNIT: This is helpful for muscle pain and spasm.   Search and Purchase a TENS 7000 2nd edition at www.tenspros.com. It should be less than $30.     TENS unit instructions: Do not shower or bathe with the unit on Turn the unit off before removing electrodes or batteries If the electrodes lose stickiness add a drop of water to the electrodes after they are disconnected from the unit and place on plastic sheet. If you continued to have difficulty, call the TENS unit company to purchase more electrodes. Do not apply lotion on the skin area prior to use. Make sure the skin is clean and dry as this will help prolong the life of the electrodes. After use, always check skin for unusual red areas, rash or other skin difficulties. If there are any skin problems, does not apply electrodes to the same area. Never remove the electrodes from the unit by pulling the wires. Do not use the TENS unit or electrodes other than as directed. Do not change electrode placement without consultating your therapist or physician. Keep 2 fingers with between each electrode. Wear time ratio is 2:1, on to off times.    For example on for 30 minutes off for 15 minutes and then on for 30 minutes off for 15 minutes

## 2018-09-30 ENCOUNTER — Telehealth: Payer: Self-pay | Admitting: Physical Therapy

## 2018-09-30 NOTE — Telephone Encounter (Signed)
Rhonda Higgins was contacted today regarding transition if in-person OP Rehab Services to telehealth due to Covid-19. Pt consented to telehealth services, educated on MyChart signup, [employer], and was agreeable to receive information via (text/email) regarding telehealth services. Pt consented and was scheduled for appointment. The appointment is scheduled for Tuesday, April 14th at 12:00pm

## 2018-10-01 ENCOUNTER — Ambulatory Visit: Payer: Medicaid Other | Admitting: Physical Therapy

## 2018-10-01 DIAGNOSIS — M545 Low back pain, unspecified: Secondary | ICD-10-CM

## 2018-10-01 DIAGNOSIS — G8929 Other chronic pain: Secondary | ICD-10-CM

## 2018-10-01 NOTE — Therapy (Addendum)
[place], [place], [address] Phone: [phone]   Fax:  [phone]  Physical Therapy Treatment I connected with  Keiara Sneeringer Higgins on 10/01/18 by a video enabled telemedicine application and verified that I am speaking with the correct person using two identifiers.   I discussed the limitations of evaluation and management by telemedicine. The patient expressed understanding and agreed to proceed.    Patient Details  Name: Rhonda Higgins MRN: [id number] Date of Birth: December 11, 1970 Referring Provider (PT): Marlou Sa Tonna Corner, MD   Encounter Date: 10/01/2018  PT End of Session - 10/01/18 1155    Visit Number  2    Number of Visits  4    Date for PT Re-Evaluation  12/11/18    PT Start Time  0086    PT Stop Time  1233    PT Time Calculation (min)  38 min    Activity Tolerance  Patient tolerated treatment well    Behavior During Therapy  [facility] for tasks assessed/performed       Past Medical History:  Diagnosis Date  . Adenocarcinoma (HCC)    endometrial, FIGO GRADE 1  . Allergic rhinitis   . Atypical chest pain    History of  . Depression   . Elevated liver enzymes   . GERD (gastroesophageal reflux disease)   . Hematuria   . History of endometrial cancer 08-02-2009   oncologist-  dr brewster/ Denman George and dr kinard/  no recurrence   endometrial adenocarinoma Stage 1B, Grade 1, FIGO--  s/p  TAH w/ BSO and pelvic lymph node dissection's and radiation therapy  . History of kidney stones   . History of radiation therapy    2011  pelvic intracavity brachytherapy treatment's for endometrial carcinoma  . History of thyroid nodule    multinodular goiter s/p  total thyroidectomy 11-19-2015  per pathology -  adenomatoid nodules  . Hyperlipidemia   . Hypothyroidism, postsurgical   . Insulin dependent diabetes mellitus ([place])    Type 2  . Left ureteral stone   . Obesity   . OSA (obstructive sleep  apnea)    severe OSA  per study 03-08-2010--  noncomplant cpap  . Overactive bladder   . Personality disorder ([place])   . Polyphagia(783.6)   . PONV (postoperative nausea and vomiting)    after ear surgery only one time  . Right lower quadrant pain   . Urgency of urination   . UTI (urinary tract infection)     Past Surgical History:  Procedure Laterality Date  . CARDIOVASCULAR STRESS TEST  06/09/2008   normal nuclear study w/ no ischemia/  normal LV function and wall motion , ef 83%  . CHOLECYSTECTOMY N/A 09/06/2017   Procedure: LAPAROSCOPIC CHOLECYSTECTOMY WITH INTRAOPERATIVE CHOLANGIOGRAM;  Surgeon: Armandina Gemma, MD;  [facility];  Service: General;  Laterality: N/A;  . CYSTOSCOPY/RETROGRADE/URETEROSCOPY/STONE EXTRACTION WITH BASKET Left 03/08/2016   Procedure: CYSTOSCOPY/RETROGRADE/URETEROSCOPY/STONE EXTRACTION WITH BASKET, STENT PLACEMENT;  Surgeon: Rana Snare, MD;  Location[facility];  Service: Urology;  Laterality: Left;  . ENDOMETRIAL BIOPSY    . ERCP N/A 09/07/2017   Procedure: ENDOSCOPIC RETROGRADE CHOLANGIOPANCREATOGRAPHY (ERCP);  Surgeon: [name], MD;  Location: [facility];  Service: Gastroenterology;  Laterality: N/A;  . HOLMIUM LASER APPLICATION Left 7/61/9509   Procedure: HOLMIUM LASER APPLICATION;  Surgeon: Rana Snare, MD;  Location[facility];  Service: Urology;  Laterality: Left;  . MOUTH SURGERY    . MYRINGECOTMY W/  REMOVAL MIDDLE EAR CHOLESTEATOMA TYPE 1 FASICA TYMPANOPLASTY  09/13/2000  . ROBOTIC ASSISTED TOTAL HYSTERECTOMY WITH BILATERAL SALPINGO OOPHERECTOMY  08-02-2009   at [facility]  dr Denman George   w/  Bilateral pelvic and para aortic lymph node dissection's  . THYROIDECTOMY N/A 11/19/2015   Procedure: TOTAL THYROIDECTOMY;  Surgeon: Armandina Gemma, MD;  Location: [facility];  Service: General;  Laterality: N/A;  . TONSILLECTOMY  age 48  . TRANSTHORACIC ECHOCARDIOGRAM  07/19/2014   ef 55-60%/  trivial TR  . TYMPANOPLASTY Right 1993     There were no vitals filed for this visit.  Subjective Assessment - 10/01/18 1156    Subjective  Just a little pain when I am up doing things- more of a tightnening. deniesradicular symptoms.     Currently in Pain?  No/denies    Aggravating Factors   emptying dishwasher                       [facility] Adult PT Treatment/Exercise - 10/01/18 0001      Exercises   Exercises  Lumbar      Lumbar Exercises: Stretches   Passive Hamstring Stretch Limitations  seated 30s each    Single Knee to Chest Stretch  Right;Left;2 reps;10 seconds    Lower Trunk Rotation Limitations  hooklying x3 each    Piriformis Stretch Limitations  seated 30s each      Lumbar Exercises: Standing   Other Standing Lumbar Exercises  hip hinge thighs on bed edge    Other Standing Lumbar Exercises  wall sit 3x30s      Lumbar Exercises: Supine   AB Set Limitations  hooklying TrA, holding with breathing    Bent Knee Raise Limitations  hooklying marching with TrA engagement   some discomfort in Rt knee   Other Supine Lumbar Exercises  tabletop holds 5x10s             PT Education - 10/01/18 1250    Education Details  stiffness without activity, regular stretching, use of TENS, HEP, muscular coordination    Person(s) Educated  Patient    Methods  Explanation;Demonstration;Verbal cues;Handout    Comprehension  Verbalized understanding;Need further instruction;Returned demonstration;Verbal cues required       PT Short Term Goals - 09/18/18 1510      PT SHORT TERM GOAL #1   Title  Pt will be I and compliant with HEP 4 weeks 10/16/18    Baseline  no HEP until today    Status  New      PT SHORT TERM GOAL #2   Title  Pt will report being able to stand at least 15 min before pain increases more than 3 increments or less than 8/10 pain. 4 weeks 10/16/18    Baseline  8/10 pain after standing 10 min    Status  New        PT Long Term Goals - 09/18/18 1515      PT LONG TERM GOAL #1   Title   Pt will be able to stand at least 20 min for ADL's with less than 5/10 pain. 12 weeks 12/11/18    Baseline  8/10 pain standing for 10 min    Status  New      PT LONG TERM GOAL #2   Title  Pt will improve lumbar ROM to [facility] (at least 60% available). 12 weeks 12/11/18    Baseline  25% flexion, 10% extension, 50% rotation    Status  New  Plan - 10/01/18 1240    Clinical Impression Statement  Pt has experienced an improvement in overall pain levels and resolution of radicular pain but continues to be limited to approx 15 min of standing. Added core strengthening exercises to HEP and practiced hip hinge for functional movement.     PT Treatment/Interventions  Cryotherapy;Electrical Stimulation;Moist Heat;Traction;Ultrasound;Therapeutic activities;Therapeutic exercise;Neuromuscular re-education;Manual techniques;Passive range of motion;Dry needling;Taping;Spinal Manipulations;Joint Manipulations    PT Next Visit Plan  continue core strength, golfer lift, decompression series    PT Home Exercise Plan  sitting H.S stretch, piriformis stretch, sidelying trunk rotation, SKTC, standing lumbar ext; PPT, marching, TT holds    Consulted and Agree with Plan of Care  Patient       Patient will benefit from skilled therapeutic intervention in order to improve the following deficits and impairments:  Decreased activity tolerance, Decreased endurance, Decreased range of motion, Decreased strength, Hypomobility, Pain, Postural dysfunction, Obesity  Visit Diagnosis: Chronic bilateral low back pain, unspecified whether sciatica present     Problem List Patient Active Problem List   Diagnosis Date Noted  . Cholelithiasis with chronic cholecystitis 09/06/2017  . Multinodular goiter (nontoxic) 11/17/2015  . Chest pain 07/20/2014  . Diabetes mellitus without complication ([place]) 16/55/3748  . GERD (gastroesophageal reflux disease) 07/19/2014  . Personality disorder ([place])   . Uterine cancer ([place])  06/22/2011  . DM 02/09/2010  . HLD (hyperlipidemia) 02/09/2010  . Obstructive sleep apnea 02/09/2010  . Allergic rhinitis 02/09/2010  . History of uterine cancer 02/09/2010   Janetta Vandoren C. Markesia Crilly PT, DPT 10/01/18 12:54 PM   [facility] [address] [place], [place], [address] Phone: [phone]   Fax:  [phone]  Name: Rhonda Higgins MRN: [id number] Date of Birth: 04-28-1971

## 2018-10-07 ENCOUNTER — Ambulatory Visit: Payer: Medicaid Other | Admitting: Physical Therapy

## 2018-10-07 ENCOUNTER — Encounter: Payer: Self-pay | Admitting: Physical Therapy

## 2018-10-07 DIAGNOSIS — M545 Low back pain, unspecified: Secondary | ICD-10-CM

## 2018-10-07 DIAGNOSIS — G8929 Other chronic pain: Secondary | ICD-10-CM

## 2018-10-07 NOTE — Patient Instructions (Signed)
Access Code: [email or web address]  URL: [email or web address]  Date: 10/07/2018  Prepared by: Selinda Eon   Exercises  Supine Diaphragmatic Breathing - 1x daily - 7x weekly  Supine Bridge - 15 reps - 2 sets - 3s hold - 1x daily - 7x weekly  Single Leg Balance with Alternating Floor Reaches - 10 reps - 3 sets - 1x daily - 7x weekly  Mini Squat with Counter Support - 15 reps - 2 sets - 1x daily - 7x weekly

## 2018-10-07 NOTE — Therapy (Addendum)
[place], [place], [address] Phone: [phone]   Fax:  [phone]  Physical Therapy Treatment/Discharge I connected with  Rhonda Higgins on 10/07/18 by a video enabled telemedicine application and verified that I am speaking with the correct person using two identifiers.   I discussed the limitations of evaluation and management by telemedicine. The patient expressed understanding and agreed to proceed.    Patient Details  Name: Rhonda Higgins MRN: [id number] Date of Birth: 1970/07/27 Referring Provider (PT): Marlou Sa Tonna Corner, MD   Encounter Date: 10/07/2018  PT End of Session - 10/07/18 1109    Visit Number  3    Number of Visits  4    Date for PT Re-Evaluation  12/11/18    Authorization Type  MCD, resubmit after 4 visits, she is intrested in telehealth    PT Start Time  1109    PT Stop Time  1141    PT Time Calculation (min)  32 min    Activity Tolerance  Patient tolerated treatment well    Behavior During Therapy  [facility] for tasks assessed/performed       Past Medical History:  Diagnosis Date  . Adenocarcinoma (HCC)    endometrial, FIGO GRADE 1  . Allergic rhinitis   . Atypical chest pain    History of  . Depression   . Elevated liver enzymes   . GERD (gastroesophageal reflux disease)   . Hematuria   . History of endometrial cancer 08-02-2009   oncologist-  dr brewster/ [place] George and dr kinard/  no recurrence   endometrial adenocarinoma Stage 1B, Grade 1, FIGO--  s/p  TAH w/ BSO and pelvic lymph node dissection's and radiation therapy  . History of kidney stones   . History of radiation therapy    2011  pelvic intracavity brachytherapy treatment's for endometrial carcinoma  . History of thyroid nodule    multinodular goiter s/p  total thyroidectomy 11-19-2015  per pathology -  adenomatoid nodules  . Hyperlipidemia   . Hypothyroidism, postsurgical   . 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 Left ureteral stone   . Obesity   . OSA (obstructive sleep apnea)    severe OSA  per study 03-08-2010--  noncomplant cpap  . Overactive bladder   . Personality disorder ([place])   . Polyphagia(783.6)   . PONV (postoperative nausea and vomiting)    after ear surgery only one time  . Right lower quadrant pain   . Urgency of urination   . UTI (urinary tract infection)     Past Surgical History:  Procedure Laterality Date  . CARDIOVASCULAR STRESS TEST  06/09/2008   normal nuclear study w/ no ischemia/  normal LV function and wall motion , ef 83%  . CHOLECYSTECTOMY N/A 09/06/2017   Procedure: LAPAROSCOPIC CHOLECYSTECTOMY WITH INTRAOPERATIVE CHOLANGIOGRAM;  Surgeon: Armandina Gemma, MD;  Location: [facility];  Service: General;  Laterality: N/A;  . CYSTOSCOPY/RETROGRADE/URETEROSCOPY/STONE EXTRACTION WITH BASKET Left 03/08/2016   Procedure: CYSTOSCOPY/RETROGRADE/URETEROSCOPY/STONE EXTRACTION WITH BASKET, STENT PLACEMENT;  Surgeon: Rana Snare, MD;  Location: [facility];  Service: Urology;  Laterality: Left;  . ENDOMETRIAL BIOPSY    . ERCP N/A 09/07/2017   Procedure: ENDOSCOPIC RETROGRADE CHOLANGIOPANCREATOGRAPHY (ERCP);  Surgeon: [name], MD;  Location: [facility];  Service: Gastroenterology;  Laterality: N/A;  . HOLMIUM LASER APPLICATION Left 0/17/5102   Procedure: HOLMIUM LASER APPLICATION;  Surgeon: Rana Snare, MD;  Location: [facility];  Service: Urology;  Laterality: Left;  . MOUTH SURGERY    . MYRINGECOTMY W/ REMOVAL MIDDLE EAR CHOLESTEATOMA TYPE 1 FASICA TYMPANOPLASTY  09/13/2000  . ROBOTIC ASSISTED TOTAL HYSTERECTOMY WITH BILATERAL SALPINGO OOPHERECTOMY  08-02-2009   at [facility]  dr Denman George   w/  Bilateral pelvic and para aortic lymph node dissection's  . THYROIDECTOMY N/A 11/19/2015   Procedure: TOTAL THYROIDECTOMY;  Surgeon: Armandina Gemma, MD;  Location: [facility];  Service: General;  Laterality: N/A;  . TONSILLECTOMY  age 14   . TRANSTHORACIC ECHOCARDIOGRAM  07/19/2014   ef 55-60%/  trivial TR  . TYMPANOPLASTY Right 1993    There were no vitals filed for this visit.  Subjective Assessment - 10/07/18 1110    Subjective  Back feels about the same, HEP was fine. Denies radicular symptoms.     Currently in Pain?  No/denies                       [facility] Adult PT Treatment/Exercise - 10/07/18 0001      Lumbar Exercises: Stretches   Lower Trunk Rotation Limitations  hooklying x5 each    Figure 4 Stretch  30 seconds    Figure 4 Stretch Limitations  both hooklying      Lumbar Exercises: Standing   Side Lunge  15 reps    Side Lunge Limitations  side lunge with squat    Other Standing Lumbar Exercises  standing hip extension    Other Standing Lumbar Exercises  golfer lift hinge, squat tailbone to wall with fwd hinge      Lumbar Exercises: Supine   Bridge  15 reps;3 seconds    Other Supine Lumbar Exercises  decompression series- hooklying, leg press, leg lengthener      Lumbar Exercises: Sidelying   Clam  Both;20 reps    Hip Abduction  Both;20 reps               PT Short Term Goals - 09/18/18 1510      PT SHORT TERM GOAL #1   Title  Pt will be I and compliant with HEP 4 weeks 10/16/18    Baseline  no HEP until today    Status  New      PT SHORT TERM GOAL #2   Title  Pt will report being able to stand at least 15 min before pain increases more than 3 increments or less than 8/10 pain. 4 weeks 10/16/18    Baseline  8/10 pain after standing 10 min    Status  New        PT Long Term Goals - 09/18/18 1515      PT LONG TERM GOAL #1   Title  Pt will be able to stand at least 20 min for ADL's with less than 5/10 pain. 12 weeks 12/11/18    Baseline  8/10 pain standing for 10 min    Status  New      PT LONG TERM GOAL #2   Title  Pt will improve lumbar ROM to [facility] (at least 60% available). 12 weeks 12/11/18    Baseline  25% flexion, 10% extension, 50% rotation    Status  New             Plan - 10/07/18 1142    Clinical Impression Statement  Pt reported her back has been "fine" and has been doing her exercises. added bending/squatting in safe mechanics to exercises today but UE hold for balance. advised that  her next visit will be the last covered under insurance and we will need to discuss plan moving forward, pt agreed.     PT Treatment/Interventions  Cryotherapy;Electrical Stimulation;Moist Heat;Traction;Ultrasound;Therapeutic activities;Therapeutic exercise;Neuromuscular re-education;Manual techniques;Passive range of motion;Dry needling;Taping;Spinal Manipulations;Joint Manipulations    PT Next Visit Plan  ERO/D/C, 1 in-clinic visit next week if ERO    PT Home Exercise Plan  sitting H.S stretch, piriformis stretch, sidelying trunk rotation, SKTC, standing lumbar ext; PPT, marching, TT holds, golfer hinge, squat at counter, decompression    Consulted and Agree with Plan of Care  Patient       Patient will benefit from skilled therapeutic intervention in order to improve the following deficits and impairments:  Decreased activity tolerance, Decreased endurance, Decreased range of motion, Decreased strength, Hypomobility, Pain, Postural dysfunction, Obesity  Visit Diagnosis: Chronic bilateral low back pain, unspecified whether sciatica present     Problem List Patient Active Problem List   Diagnosis Date Noted  . Cholelithiasis with chronic cholecystitis 09/06/2017  . Multinodular goiter (nontoxic) 11/17/2015  . Chest pain 07/20/2014  . Diabetes mellitus without complication ([place]) 91/63/8466  . GERD (gastroesophageal reflux disease) 07/19/2014  . Personality disorder ([place])   . Uterine cancer ([place]) 06/22/2011  . DM 02/09/2010  . HLD (hyperlipidemia) 02/09/2010  . Obstructive sleep apnea 02/09/2010  . Allergic rhinitis 02/09/2010  . History of uterine cancer 02/09/2010   Clearance Chenault C. Kiyo Heal PT, DPT 10/07/18 12:58 PM   [place] [facility] [address] [place], [place], [address] Phone: [phone]   Fax:  [phone]  Name: Rhonda Higgins MRN: [id number] Date of Birth: 05-04-1971  PHYSICAL THERAPY DISCHARGE SUMMARY  Visits from Start of Care: 3  Current functional level related to goals / functional outcomes: See above   Remaining deficits: See above   Education / Equipment: Anatomy of condition, POC, HEP, exercise form/rationale  Plan: Patient agrees to discharge.  Patient goals were not met. Patient is being discharged due to not returning since the last visit.  ?????     Nyeem Stoke C. Cable Fearn PT, DPT 02/19/19 12:56 PM

## 2018-10-11 ENCOUNTER — Ambulatory Visit: Payer: Medicaid Other | Admitting: Physical Therapy

## 2018-10-16 ENCOUNTER — Other Ambulatory Visit: Payer: Self-pay | Admitting: Gastroenterology

## 2018-10-16 ENCOUNTER — Other Ambulatory Visit: Payer: Self-pay

## 2018-10-16 ENCOUNTER — Ambulatory Visit
Admission: RE | Admit: 2018-10-16 | Discharge: 2018-10-16 | Disposition: A | Discharge status: Home / Self Care | Payer: Medicaid Other | Source: Ambulatory Visit | Attending: Gastroenterology | Admitting: Gastroenterology

## 2018-10-16 DIAGNOSIS — R1030 Lower abdominal pain, unspecified: Secondary | ICD-10-CM

## 2018-10-16 DIAGNOSIS — R748 Abnormal levels of other serum enzymes: Secondary | ICD-10-CM

## 2018-10-16 IMAGING — CT CT ABDOMEN AND PELVIS WITH CONTRAST
1 of 3 series · 12 of 32 positions shown, 18 images · IV contrast (APPLIED)
Comparison: CT scan of [DATE].

CLINICAL DATA: Lower abdominal pain.  Elevated lipase level.

EXAM:
CT ABDOMEN AND PELVIS WITH CONTRAST
TECHNIQUE: Multidetector CT imaging of the abdomen and pelvis was performed
using the standard protocol following bolus administration of
intravenous contrast.
CONTRAST:  125mL [IG] IOPAMIDOL ([IG]) INJECTION 61%

[Series 2: abd/pelvis w/cm · axial · 0.96mm/px · z∈[-558,-158]mm · 12 of 96 slices shown, 18 images]
[im 8/96  soft-tissue]
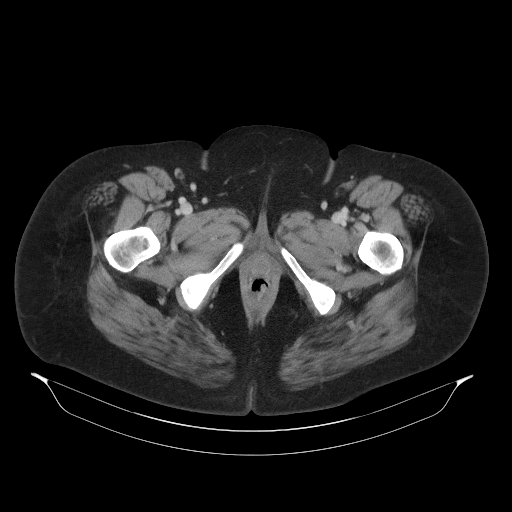
[im 8/96  bone]
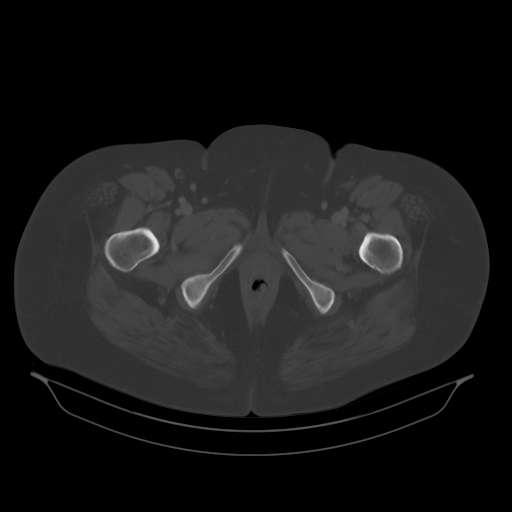
[im 15/96  soft-tissue]
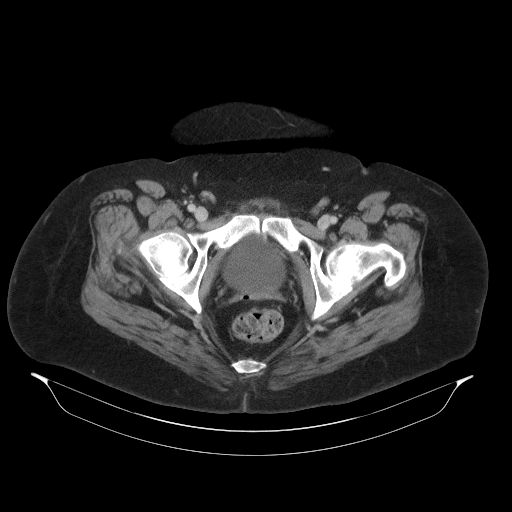
[im 22/96  soft-tissue]
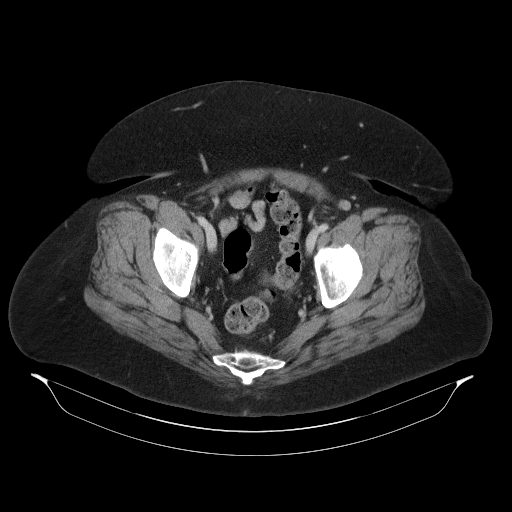
[im 30/96  soft-tissue]
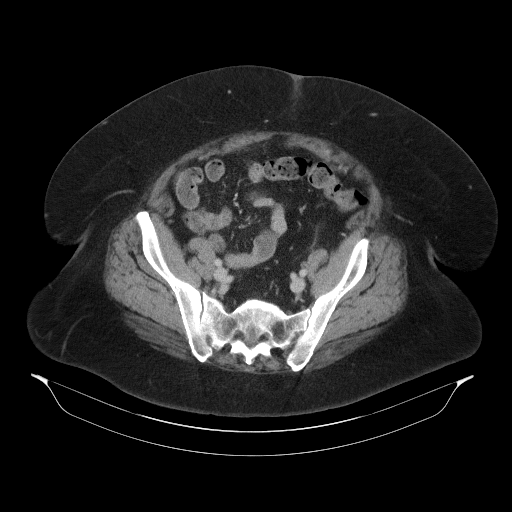
[im 37/96  soft-tissue]
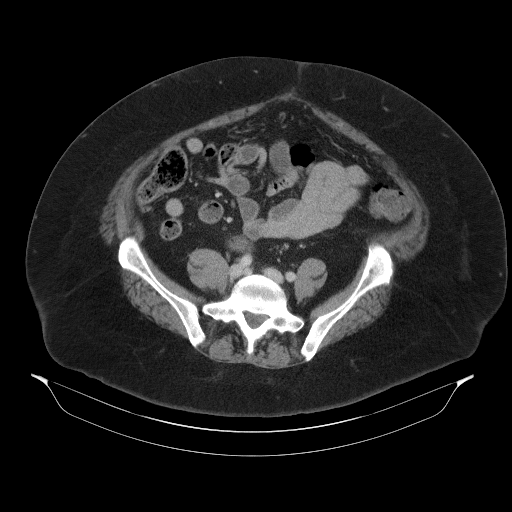
[im 44/96  soft-tissue]
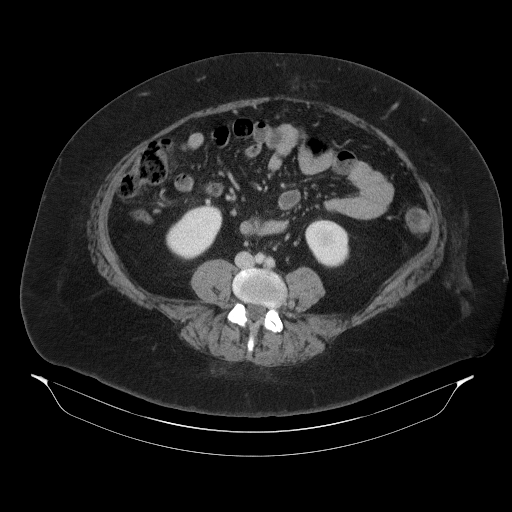
[im 52/96  soft-tissue]
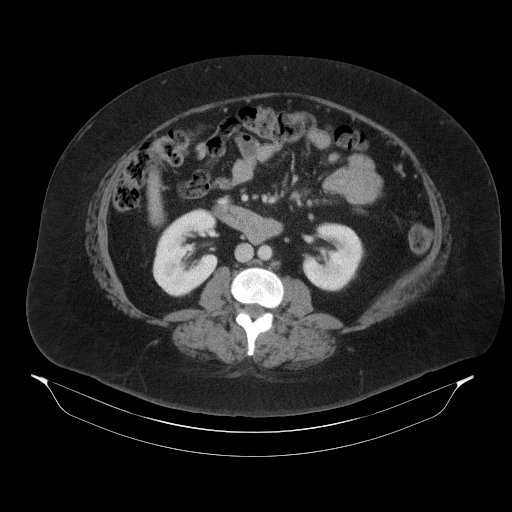
[im 59/96  soft-tissue]
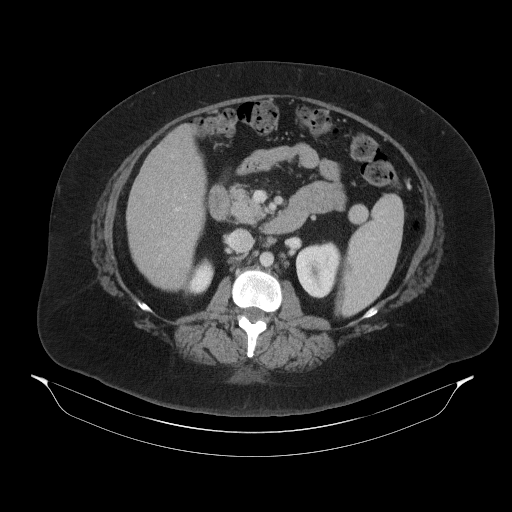
[im 66/96  soft-tissue]
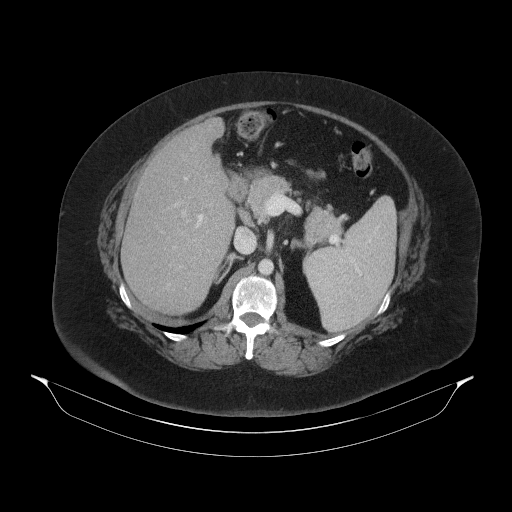
[im 66/96  lung]
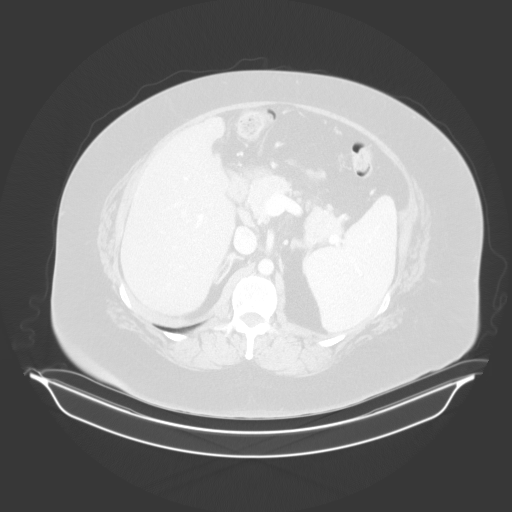
[im 66/96  bone]
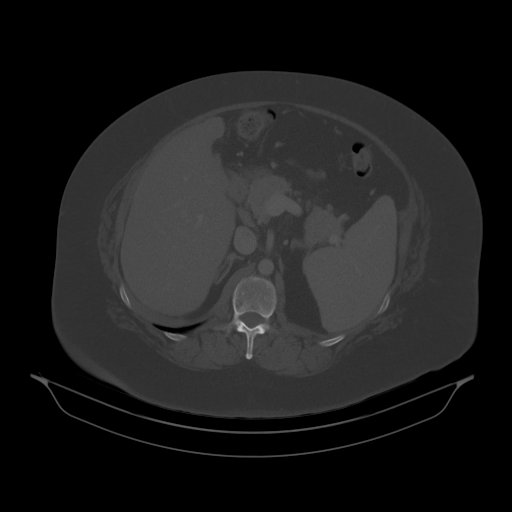
[im 74/96  soft-tissue]
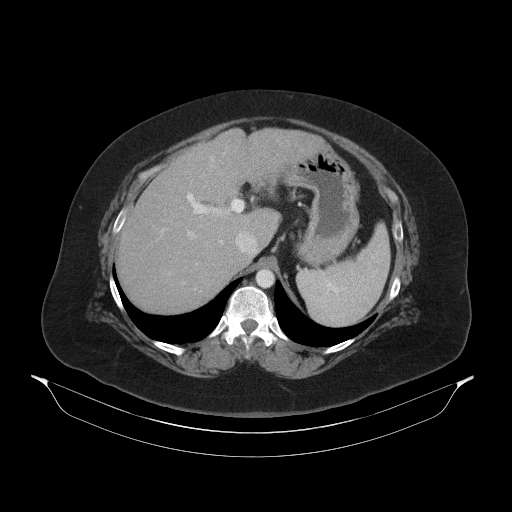
[im 74/96  lung]
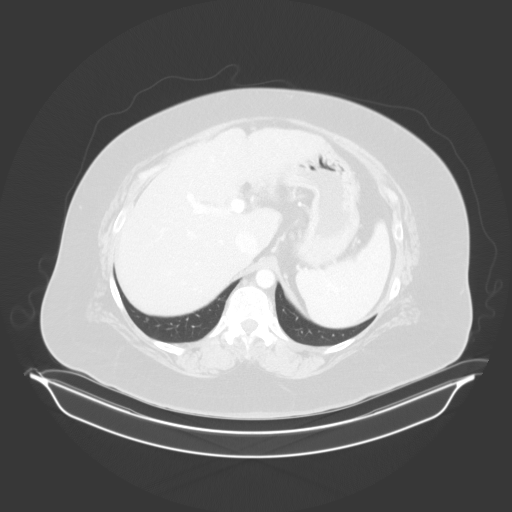
[im 81/96  soft-tissue]
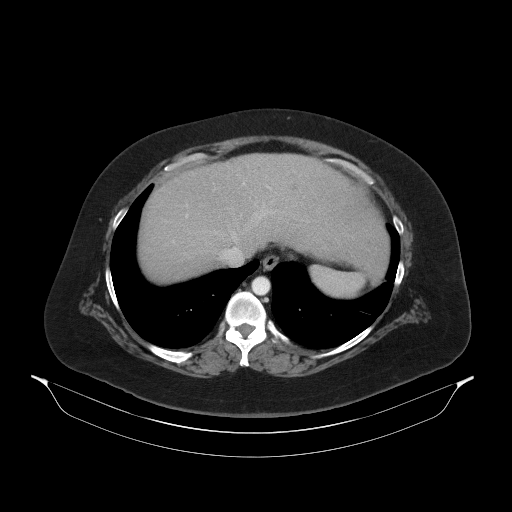
[im 81/96  lung]
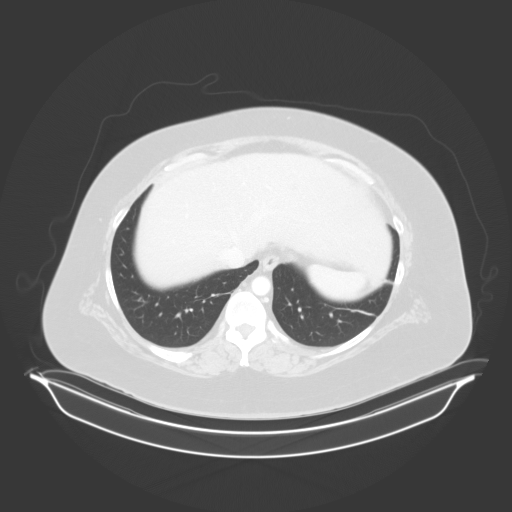
[im 88/96  soft-tissue]
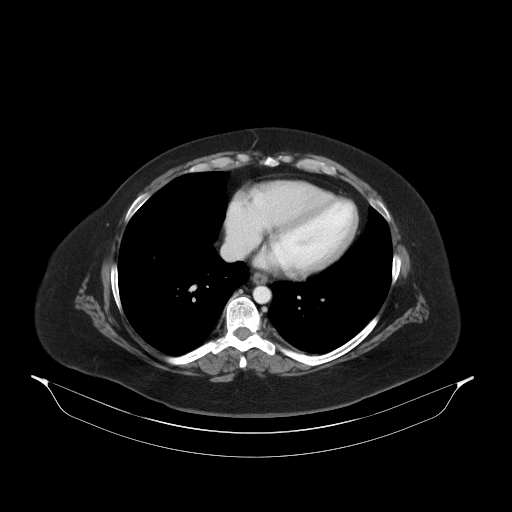
[im 88/96  lung]
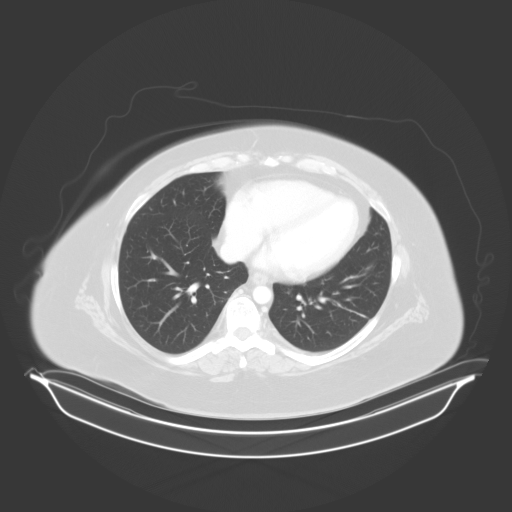

[12 of 32 positions shown; findings below may reference images not displayed]

FINDINGS: Lower chest: No acute abnormality.

Hepatobiliary: No focal liver abnormality is seen. Status post
cholecystectomy. No biliary dilatation.

Pancreas: Unremarkable. No pancreatic ductal dilatation or
surrounding inflammatory changes.

Spleen: Normal in size without focal abnormality.

Adrenals/Urinary Tract: Adrenal glands appear normal. Stable small
left renal calculus is noted. No hydronephrosis or renal obstruction
is noted. Urinary bladder is unremarkable.

Stomach/Bowel: Stomach is within normal limits. Appendix appears
normal. No evidence of bowel wall thickening, distention, or
inflammatory changes.

Vascular/Lymphatic: No significant vascular findings are present. No
enlarged abdominal or pelvic lymph nodes.

Reproductive: Status post hysterectomy. No adnexal masses.

Other: Small fat containing periumbilical hernia is noted. No
ascites is noted.

Musculoskeletal: No acute or significant osseous findings.
IMPRESSION: Stable small nonobstructive left renal calculus. No hydronephrosis
or renal obstruction is noted.

Small fat containing periumbilical hernia.

No other abnormality seen in the abdomen or pelvis.

## 2018-10-16 MED ORDER — IOPAMIDOL (ISOVUE-300) INJECTION 61%
125.0000 mL | Freq: Once | INTRAVENOUS | Status: AC | PRN
  Administered 2018-10-16: 125 mL via INTRAVENOUS

## 2018-10-29 ENCOUNTER — Ambulatory Visit: Payer: Medicaid Other | Admitting: Podiatry

## 2018-10-29 ENCOUNTER — Other Ambulatory Visit: Payer: Self-pay

## 2018-10-29 VITALS — Temp 97.3°F

## 2018-10-29 DIAGNOSIS — M79674 Pain in right toe(s): Secondary | ICD-10-CM | POA: Diagnosis not present

## 2018-10-29 DIAGNOSIS — B351 Tinea unguium: Secondary | ICD-10-CM | POA: Diagnosis not present

## 2018-10-29 DIAGNOSIS — M79675 Pain in left toe(s): Secondary | ICD-10-CM | POA: Diagnosis not present

## 2018-10-29 DIAGNOSIS — E1142 Type 2 diabetes mellitus with diabetic polyneuropathy: Secondary | ICD-10-CM

## 2018-10-29 NOTE — Patient Instructions (Signed)

## 2018-11-02 ENCOUNTER — Encounter (HOSPITAL_COMMUNITY): Payer: Self-pay | Admitting: Emergency Medicine

## 2018-11-02 ENCOUNTER — Other Ambulatory Visit: Payer: Self-pay

## 2018-11-02 DIAGNOSIS — Z8541 Personal history of malignant neoplasm of cervix uteri: Secondary | ICD-10-CM | POA: Diagnosis not present

## 2018-11-02 DIAGNOSIS — R079 Chest pain, unspecified: Secondary | ICD-10-CM | POA: Diagnosis present

## 2018-11-02 DIAGNOSIS — E119 Type 2 diabetes mellitus without complications: Secondary | ICD-10-CM | POA: Insufficient documentation

## 2018-11-02 DIAGNOSIS — Z794 Long term (current) use of insulin: Secondary | ICD-10-CM | POA: Diagnosis not present

## 2018-11-02 DIAGNOSIS — E039 Hypothyroidism, unspecified: Secondary | ICD-10-CM | POA: Diagnosis not present

## 2018-11-02 MED ORDER — SODIUM CHLORIDE 0.9% FLUSH
3.0000 mL | Freq: Once | INTRAVENOUS | Status: AC
  Administered 2018-11-03: 01:00:00 3 mL via INTRAVENOUS

## 2018-11-02 NOTE — ED Triage Notes (Signed)
Patient complaining of mid chest pain that started two days ago. Patient states it just started hurting. No injuries.

## 2018-11-03 ENCOUNTER — Emergency Department (HOSPITAL_COMMUNITY): Payer: Medicaid Other

## 2018-11-03 ENCOUNTER — Encounter: Payer: Self-pay | Admitting: Podiatry

## 2018-11-03 ENCOUNTER — Emergency Department (HOSPITAL_COMMUNITY)
Admission: EM | Admit: 2018-11-03 | Discharge: 2018-11-03 | Disposition: A | Discharge status: Home / Self Care | Payer: Medicaid Other | Attending: Emergency Medicine | Admitting: Emergency Medicine

## 2018-11-03 DIAGNOSIS — R079 Chest pain, unspecified: Secondary | ICD-10-CM

## 2018-11-03 LAB — BASIC METABOLIC PANEL
Anion gap: 11 (ref 5–15)
BUN: 12 mg/dL (ref 6–20)
CO2: 24 mmol/L (ref 22–32)
Calcium: 8.4 mg/dL — ABNORMAL LOW (ref 8.9–10.3)
Chloride: 106 mmol/L (ref 98–111)
Creatinine, Ser: 0.65 mg/dL (ref 0.44–1.00)
GFR calc Af Amer: >60 mL/min (ref >60)
GFR calc non Af Amer: >60 mL/min (ref >60)
Glucose, Bld: 184 mg/dL — ABNORMAL HIGH (ref 70–99)
Potassium: 3.7 mmol/L (ref 3.5–5.1)
Sodium: 141 mmol/L (ref 135–145)

## 2018-11-03 LAB — CBC
HCT: 41.1 % (ref 36.0–46.0)
Hemoglobin: 13.1 g/dL (ref 12.0–15.0)
MCH: 26.9 pg (ref 26.0–34.0)
MCHC: 31.9 g/dL (ref 30.0–36.0)
MCV: 84.4 fL (ref 80.0–100.0)
Platelets: 108 10*3/uL — ABNORMAL LOW (ref 150–400)
RBC: 4.87 MIL/uL (ref 3.87–5.11)
RDW: 14.5 % (ref 11.5–15.5)
WBC: 4.7 10*3/uL (ref 4.0–10.5)
nRBC: 0 % (ref 0.0–0.2)

## 2018-11-03 LAB — TROPONIN I
Troponin I: <0.03 ng/mL (ref <0.03)
Troponin I: <0.03 ng/mL (ref <0.03)

## 2018-11-03 LAB — I-STAT BETA HCG BLOOD, ED (MC, WL, AP ONLY): I-stat hCG, quantitative: <5 m[IU]/mL (ref <5)

## 2018-11-03 IMAGING — CR CHEST - 2 VIEW
2 series · 2 of 2 positions shown · non-contrast
Comparison: [DATE]

CLINICAL DATA: Chest pain

EXAM:
CHEST - 2 VIEW

[w chest pa]
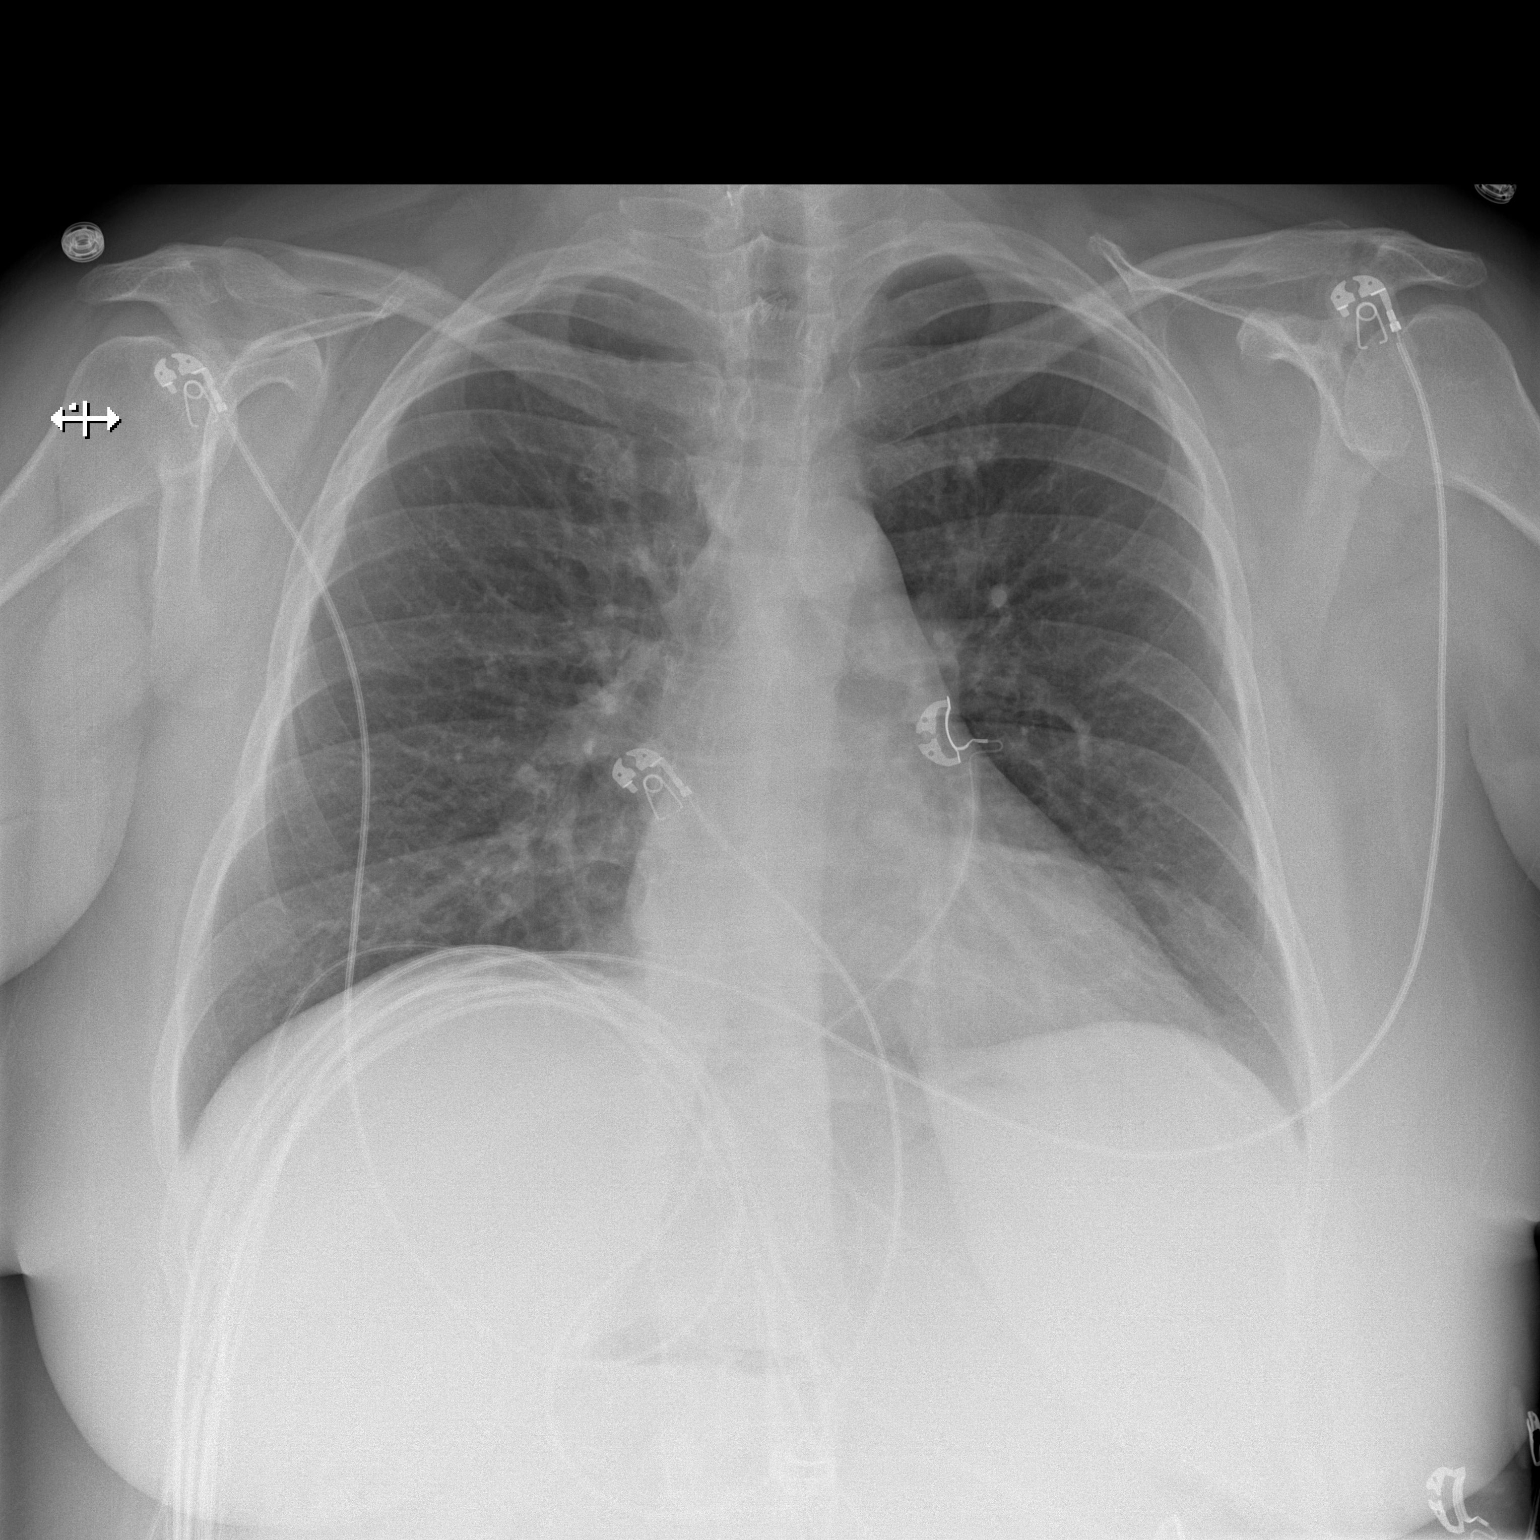

[w chest lat]
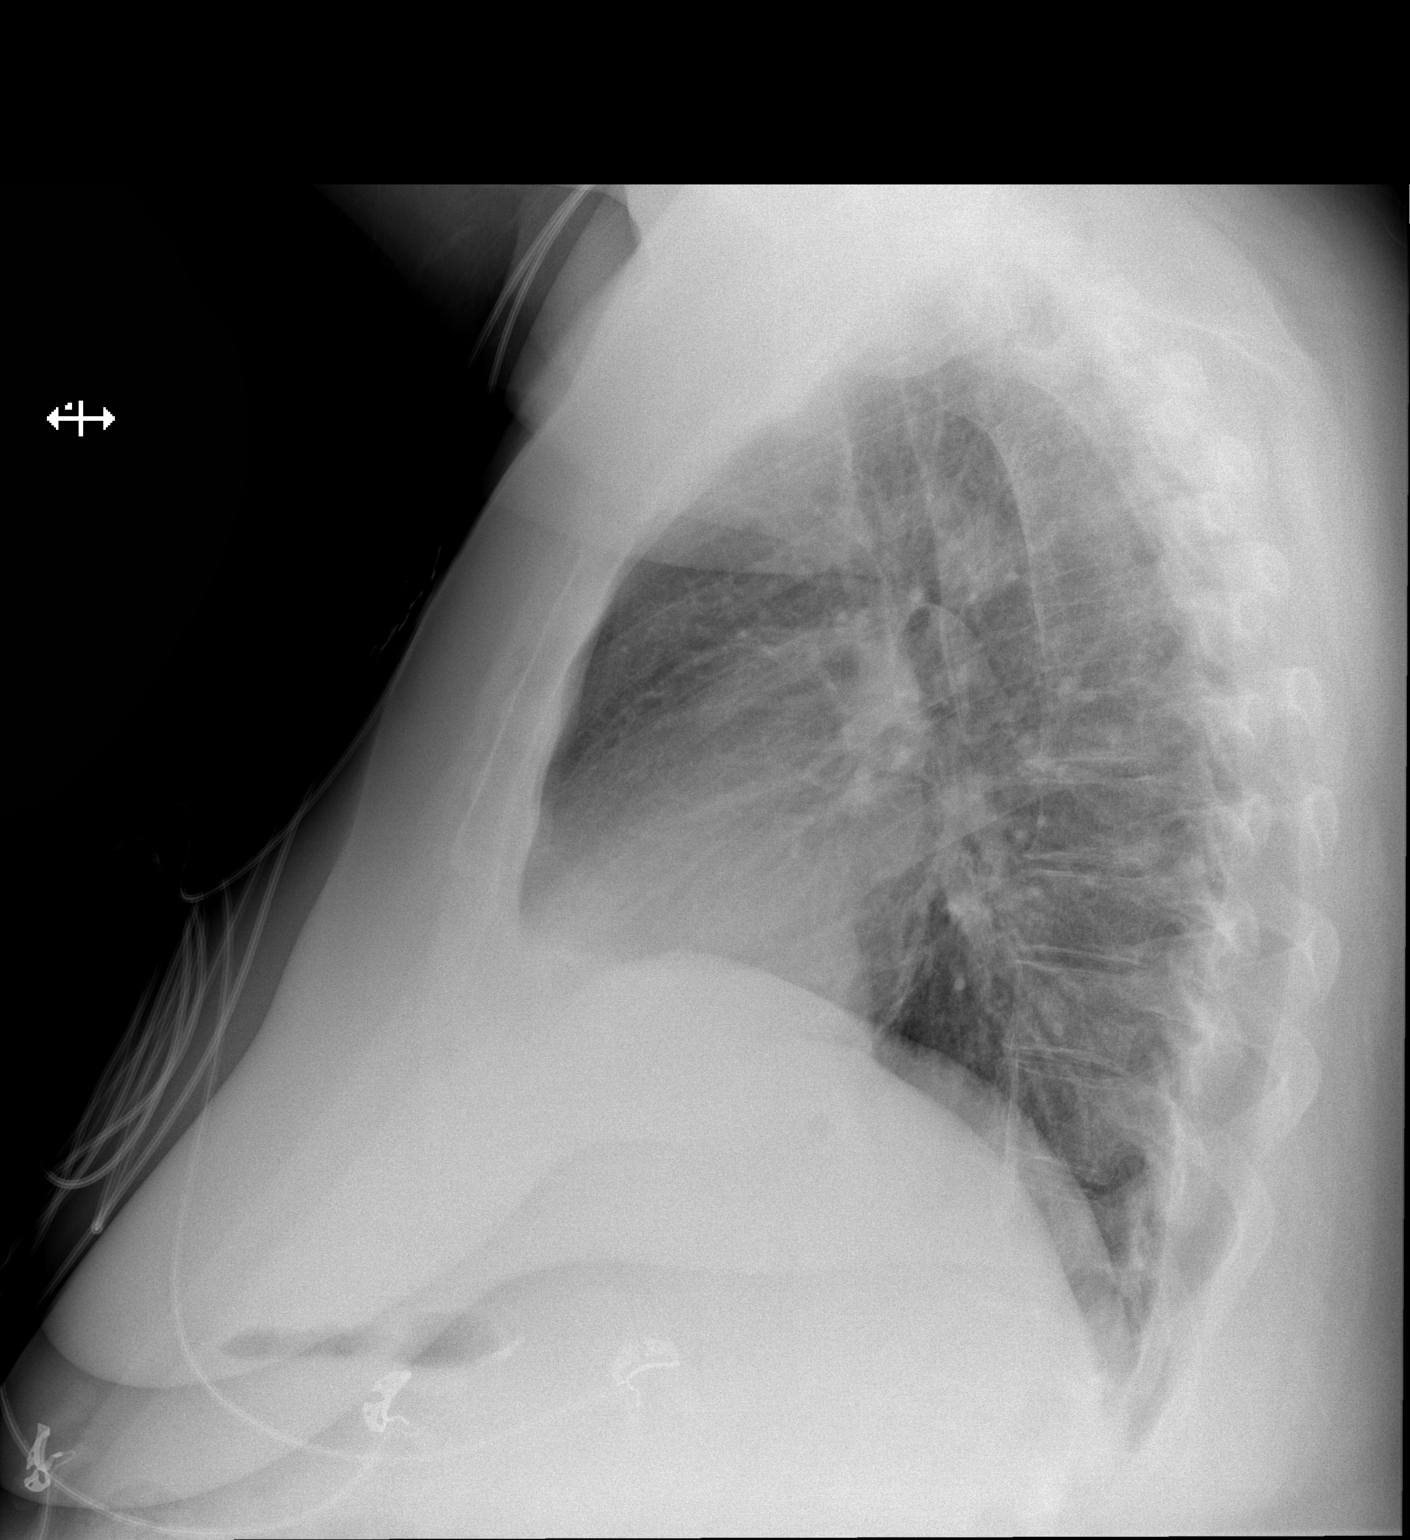

[2 of 2 positions shown; findings below may reference images not displayed]

FINDINGS: The heart size and mediastinal contours are within normal limits.
Both lungs are clear. The visualized skeletal structures are
unremarkable.
IMPRESSION: No active cardiopulmonary disease.

## 2018-11-03 MED ORDER — NITROGLYCERIN 0.4 MG SL SUBL: 0.4000 mg | SUBLINGUAL_TABLET | SUBLINGUAL | 0 refills | Status: DC | PRN

## 2018-11-03 NOTE — Progress Notes (Signed)
Subjective: Rhonda Higgins presents today with history of  neuropathy with cc of painful, mycotic toenails.  Pain is aggravated when wearing enclosed shoe gear and relieved with periodic professional debridement.  Patient has diabetic neuropathy managed with gabapentin.  Patient has subjective symptoms of neuropathy described as tingling, burning, pins/needles.  Rhonda Cowman, MD is her PCP.    Current Outpatient Medications:  .  acetaminophen (TYLENOL) 500 MG tablet, Take 1,000 mg by mouth every 6 (six) hours as needed for mild pain, moderate pain or headache., Disp: , Rfl:  .  Ascorbic Acid (VITAMIN C) 1000 MG tablet, Take 1,000 mg by mouth daily., Disp: , Rfl:  .  buPROPion (WELLBUTRIN XL) 150 MG 24 hr tablet, Take 150 mg by mouth daily. , Disp: , Rfl:  .  cetirizine (ZYRTEC) 10 MG tablet, Take 10 mg by mouth at bedtime. , Disp: , Rfl:  .  cholecalciferol (VITAMIN D) 25 MCG (1000 UT) tablet, Take 1,000 Units by mouth daily., Disp: , Rfl:  .  dicyclomine (BENTYL) 20 MG tablet, Take 20 mg by mouth 4 (four) times daily as needed for spasms. , Disp: , Rfl:  .  gabapentin (NEURONTIN) 400 MG capsule, Take 400-1,200 mg by mouth See admin instructions. Pt takes one capsule in the morning, one in the afternoon, and three at bedtime., Disp: , Rfl:  .  glimepiride (AMARYL) 4 MG tablet, Take 4 mg by mouth 2 (two) times daily. , Disp: , Rfl: 5 .  ibuprofen (ADVIL,MOTRIN) 800 MG tablet, Take 800 mg by mouth every 8 (eight) hours as needed for moderate pain., Disp: , Rfl:  .  lamoTRIgine (LAMICTAL) 200 MG tablet, Take 200 mg by mouth at bedtime. , Disp: , Rfl: 2 .  LANTUS SOLOSTAR 100 UNIT/ML Solostar Pen, Inject 30 Units into the skin at bedtime. , Disp: , Rfl: 0 .  lisinopril (PRINIVIL,ZESTRIL) 5 MG tablet, Take 5 mg by mouth at bedtime. , Disp: , Rfl:  .  Multiple Vitamins-Minerals (MULTIVITAMIN WITH MINERALS) tablet, Take 1 tablet by mouth every evening. , Disp: , Rfl:  .  nitroGLYCERIN  (NITROSTAT) 0.4 MG SL tablet, Place 1 tablet (0.4 mg total) under the tongue every 5 (five) minutes as needed for chest pain., Disp: 30 tablet, Rfl: 0 .  ondansetron (ZOFRAN) 4 MG tablet, Take 4 mg by mouth 3 (three) times daily as needed for nausea. , Disp: , Rfl:  .  pantoprazole (PROTONIX) 40 MG tablet, Take 40 mg by mouth every morning. , Disp: , Rfl: 0 .  risperiDONE (RISPERDAL) 2 MG tablet, Take 2 mg by mouth daily. , Disp: , Rfl: 2 .  simvastatin (ZOCOR) 40 MG tablet, Take 40 mg by mouth at bedtime. , Disp: , Rfl:  .  sitaGLIPtin-metformin (JANUMET) 50-1000 MG tablet, Take 1 tablet by mouth 2 (two) times daily with a meal., Disp: , Rfl:  .  SYNTHROID 175 MCG tablet, Take 175 mcg by mouth daily before breakfast. , Disp: , Rfl:  .  VICTOZA 18 MG/3ML SOPN, Inject 1.2 mg into the skin daily. , Disp: , Rfl:   Allergies  Allergen Reactions  . Hydrocodone Shortness Of Breath  . Tape Rash    Paper tape is ok  . Hydrocodone-Acetaminophen Itching  . Other Itching  . Codeine Itching    Objective: Vitals:   10/29/18 0954  Temp: (!) 97.3 F (36.3 C)   Vascular Examination: Capillary refill time immediate x 10 digits.  Dorsalis pedis and Posterior tibial pulses present  b/l.  Digital hair x 10 digits was absent.  Skin temperature gradient WNL b/l.  Dermatological Examination: Skin with normal turgor, texture and tone b/l.  Toenails 1-5 b/l discolored, thick, dystrophic with subungual debris and pain with palpation to nailbeds due to thickness of nails.Incurvated nailplate b/l great toes with tenderness to palpation. No erythema, no edema, no drainage noted.  Musculoskeletal: Muscle strength 5/5 to all muscle groups b/l.  Neurological: Sensation with 10 gram monofilament is absent b/l.  Vibratory sensation absent b/l.  Assessment: 1. Painful onychomycosis toenails 1-5 b/l 2. Ingrown toenails b/l great toes, noninfected 3. NIDDM with neuropathy  Plan: 1. Toenails 1-5 b/l  were debrided in length and girth without iatrogenic bleeding.Offending nail borders debrided and curretaged b/l great toes. Borders cleansed with alcohol. Antibiotic ointment applied. No further treatment required by patient. 2. Patient to continue soft, supportive shoe gear 3. Patient to report any pedal injuries to medical professional  4. Follow up 3 months.  5. Patient/POA to call should there be a concern in the interim.

## 2018-11-03 NOTE — ED Provider Notes (Signed)
Emergency Department Provider Note   I have reviewed the triage vital signs and the nursing notes.   HISTORY  Chief Complaint Chest Pain   HPI Rhonda Higgins is a 48 y.o. female with medical problems as documented below and is a strong family history of early coronary artery disease with multiple family members having bypass and heart attacks in their 47s and 49s.  She presents emerged from today with some atypical sounding chest pain.  She has had sharp retrosternal pain without radiation intermittently without provocation for the last few days.  It shows every couple hours she would have it now she is having a couple times an hour.  She does have any associated shortness of breath, nausea, vomiting, diaphoresis, dizziness or other symptoms.  She not had any recent fever or cough or known sick contacts.  She has no lower extremity swelling, rash, trauma or other preceding factor states she had a stress test was been 15 or 16 years and it was normal.  She has a cardiologist but has not seen them recently.   No other associated or modifying symptoms.    Past Medical History:  Diagnosis Date  . Adenocarcinoma (HCC)    endometrial, FIGO GRADE 1  . Allergic rhinitis   . Atypical chest pain    History of  . Depression   . Elevated liver enzymes   . GERD (gastroesophageal reflux disease)   . Hematuria   . History of endometrial cancer 08-02-2009   oncologist-  dr brewster/ Denman George and dr kinard/  no recurrence   endometrial adenocarinoma Stage 1B, Grade 1, FIGO--  s/p  TAH w/ BSO and pelvic lymph node dissection's and radiation therapy  . History of kidney stones   . History of radiation therapy    2011  pelvic intracavity brachytherapy treatment's for endometrial carcinoma  . History of thyroid nodule    multinodular goiter s/p  total thyroidectomy 11-19-2015  per pathology -  adenomatoid nodules  . Hyperlipidemia   . Hypothyroidism, postsurgical   . 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 Left ureteral stone   . Obesity   . OSA (obstructive sleep apnea)    severe OSA  per study 03-08-2010--  noncomplant cpap  . Overactive bladder   . Personality disorder ([place])   . Polyphagia(783.6)   . PONV (postoperative nausea and vomiting)    after ear surgery only one time  . Right lower quadrant pain   . Urgency of urination   . UTI (urinary tract infection)     Patient Active Problem List   Diagnosis Date Noted  . Cholelithiasis with chronic cholecystitis 09/06/2017  . Multinodular goiter (nontoxic) 11/17/2015  . Chest pain 07/20/2014  . Diabetes mellitus without complication ([place]) 23/76/2831  . GERD (gastroesophageal reflux disease) 07/19/2014  . Personality disorder ([place])   . Uterine cancer ([place]) 06/22/2011  . DM 02/09/2010  . HLD (hyperlipidemia) 02/09/2010  . Obstructive sleep apnea 02/09/2010  . Allergic rhinitis 02/09/2010  . History of uterine cancer 02/09/2010    Past Surgical History:  Procedure Laterality Date  . CARDIOVASCULAR STRESS TEST  06/09/2008   normal nuclear study w/ no ischemia/  normal LV function and wall motion , ef 83%  . CHOLECYSTECTOMY N/A 09/06/2017   Procedure: LAPAROSCOPIC CHOLECYSTECTOMY WITH INTRAOPERATIVE CHOLANGIOGRAM;  Surgeon: Armandina Gemma, MD;  [facility];  Service: General;  Laterality: N/A;  . CYSTOSCOPY/RETROGRADE/URETEROSCOPY/STONE EXTRACTION WITH BASKET Left 03/08/2016   Procedure: CYSTOSCOPY/RETROGRADE/URETEROSCOPY/STONE  EXTRACTION WITH BASKET, STENT PLACEMENT;  Surgeon: Rana Snare, MD;  Location: [facility];  Service: Urology;  Laterality: Left;  . ENDOMETRIAL BIOPSY    . ERCP N/A 09/07/2017   Procedure: ENDOSCOPIC RETROGRADE CHOLANGIOPANCREATOGRAPHY (ERCP);  Surgeon: [name], MD;  Location: [facility];  Service: Gastroenterology;  Laterality: N/A;  . HOLMIUM LASER APPLICATION Left 09/25/8117   Procedure: HOLMIUM LASER APPLICATION;  Surgeon: Rana Snare, MD;   Location[facility];  Service: Urology;  Laterality: Left;  . MOUTH SURGERY    . MYRINGECOTMY W/ REMOVAL MIDDLE EAR CHOLESTEATOMA TYPE 1 FASICA TYMPANOPLASTY  09/13/2000  . ROBOTIC ASSISTED TOTAL HYSTERECTOMY WITH BILATERAL SALPINGO OOPHERECTOMY  08-02-2009   at [facility]  dr Denman George   w/  Bilateral pelvic and para aortic lymph node dissection's  . THYROIDECTOMY N/A 11/19/2015   Procedure: TOTAL THYROIDECTOMY;  Surgeon: Armandina Gemma, MD;  Location: [facility];  Service: General;  Laterality: N/A;  . TONSILLECTOMY  age 71  . TRANSTHORACIC ECHOCARDIOGRAM  07/19/2014   ef 55-60%/  trivial TR  . TYMPANOPLASTY Right 1993    Current Outpatient Rx  . Order #: [id number] Class: Historical Med  . Order #: [id number] Class: Historical Med  . Order #: [id number] Class: Historical Med  . Order #: [id number] Class: Historical Med  . Order #: [id number] Class: Historical Med  . Order #: [id number] Class: Historical Med  . Order #: [id number] Class: Historical Med  . Order #: [id number] Class: Historical Med  . Order #: [id number] Class: Historical Med  . Order #: [id number] Class: Historical Med  . Order #: [id number] Class: Historical Med  . Order #: [id number] Class: Historical Med  . Order #: [id number] Class: Historical Med  . Order #: [id number] Class: Historical Med  . Order #: [id number] Class: Historical Med  . Order #: [id number] Class: Historical Med  . Order #: [id number] Class: Historical Med  . Order #: [id number] Class: Historical Med  . Order #: [id number] Class: Historical Med  . Order #: [id number] Class: Historical Med  . Order #: [id number] Class: Normal    Allergies Hydrocodone; Tape; Hydrocodone-acetaminophen; Other; and Codeine  Family History  Problem Relation Age of Onset  . Heart disease Sister   . Heart attack Brother   . Heart disease Brother   . Heart disease Sister   . Diabetes Sister   . Asthma Mother   . Heart disease Mother   . Diabetes Mother   . Emphysema Mother   . Hypertension Mother   .  Stroke Mother   . Prostate cancer Father     Social History Social History   Tobacco Use  . Smoking status: Never Smoker  . Smokeless tobacco: Never Used  Substance Use Topics  . Alcohol use: No  . Drug use: No    Review of Systems  All other systems negative except as documented in the HPI. All pertinent positives and negatives as reviewed in the HPI. ____________________________________________   PHYSICAL EXAM:  VITAL SIGNS: ED Triage Vitals  Enc Vitals Group     BP 11/02/18 2332 125/78     Pulse Rate 11/02/18 2332 91     Resp 11/02/18 2332 16     Temp 11/02/18 2332 98.9 F (37.2 C)     Temp Source 11/02/18 2332 Oral     SpO2 11/02/18 2332 95 %     Weight 11/02/18 2332 230 lb (104.3 kg)     Height 11/02/18 2332 5\' 3"  (1.6 m)    Constitutional: Alert and oriented. Well appearing and in no acute distress. Eyes: Conjunctivae are normal. PERRL. EOMI. Head:  Atraumatic. Nose: No congestion/rhinnorhea. Mouth/Throat: Mucous membranes are moist.  Oropharynx non-erythematous. Neck: No stridor.  No meningeal signs.   Cardiovascular: Normal rate, regular rhythm. Good peripheral circulation. Grossly normal heart sounds.   Respiratory: Normal respiratory effort.  No retractions. Lungs CTAB. Gastrointestinal: Soft and nontender. No distention.  Musculoskeletal: No lower extremity tenderness nor edema. No gross deformities of extremities. Neurologic:  Normal speech and language. No gross focal neurologic deficits are appreciated.  Skin:  Skin is warm, dry and intact. No rash noted.  ____________________________________________   LABS (all labs ordered are listed, but only abnormal results are displayed)  Labs Reviewed  BASIC METABOLIC PANEL - Abnormal; Notable for the following components:      Result Value   Glucose, Bld 184 (*)    Calcium 8.4 (*)    All other components within normal limits  CBC - Abnormal; Notable for the following components:   Platelets 108 (*)     All other components within normal limits  TROPONIN I  TROPONIN I  I-STAT BETA HCG BLOOD, ED ([facility], [facility], [facility] ONLY)   ____________________________________________  EKG   EKG Interpretation  Date/Time:  Saturday Nov 02 2018 23:41:16 EDT Ventricular Rate:  87 PR Interval:    QRS Duration: 76 QT Interval:  375 QTC Calculation: 452 R Axis:   74 Text Interpretation:  Sinus rhythm Low voltage, precordial leads Probable anteroseptal infarct, old Baseline wander in lead(s) V2 No significant change since last tracing Confirmed by Merrily Pew [phone]) on 11/03/2018 12:03:13 AM       ____________________________________________  RADIOLOGY  Dg Chest 2 View  Result Date: 11/03/2018 CLINICAL DATA:  Chest pain EXAM: CHEST - 2 VIEW COMPARISON:  08/30/2017 FINDINGS: The heart size and mediastinal contours are within normal limits. Both lungs are clear. The visualized skeletal structures are unremarkable. IMPRESSION: No active cardiopulmonary disease. Electronically Signed   By: Ulyses Jarred M.D.   On: 11/03/2018 00:51    ____________________________________________   PROCEDURES  Procedure(s) performed:   Procedures   ____________________________________________   INITIAL IMPRESSION / ASSESSMENT AND PLAN / ED COURSE  Overall patient appears very well.  Sounds very atypical sounding for cardiac pain may be esophageal spasm or other GI etiology.  However she does have a strong family history, obesity and other risk factors so we will do a delta troponin and EKGs to make sure that does not the case.  Will try nitroglycerin as that would help with esophageal spasm or angina.  She will follow-up with her cardiologist if these are all negative to get another stress test or further work-up as deemed necessary.  Pertinent labs & imaging results that were available during my care of the patient were reviewed by me and considered in my medical decision making (see chart for details).  A medical  screening exam was performed and I feel the patient has had an appropriate workup for their chief complaint at this time and likelihood of emergent condition existing is low. They have been counseled on decision, discharge, follow up and which symptoms necessitate immediate return to the emergency department. They or their family verbally stated understanding and agreement with plan and discharged in stable condition.   ____________________________________________  FINAL CLINICAL IMPRESSION(S) / ED DIAGNOSES  Final diagnoses:  Nonspecific chest pain     MEDICATIONS GIVEN DURING THIS VISIT:  Medications  sodium chloride flush (NS) 0.9 % injection 3 mL (3 mLs Intravenous Given 11/03/18 0030)     NEW OUTPATIENT MEDICATIONS STARTED DURING THIS  VISIT:  Discharge Medication List as of 11/03/2018  4:54 AM    START taking these medications   Details  nitroGLYCERIN (NITROSTAT) 0.4 MG SL tablet Place 1 tablet (0.4 mg total) under the tongue every 5 (five) minutes as needed for chest pain., Starting Sun 11/03/2018, Normal        Note:  This note was prepared with assistance of Dragon voice recognition software. Occasional wrong-word or sound-a-like substitutions may have occurred due to the inherent limitations of voice recognition software.   Keyvon Herter, Corene Cornea, MD 11/03/18 [phone]

## 2019-01-02 ENCOUNTER — Ambulatory Visit: Payer: Self-pay

## 2019-01-02 ENCOUNTER — Encounter: Payer: Self-pay | Admitting: Orthopedic Surgery

## 2019-01-02 ENCOUNTER — Ambulatory Visit (INDEPENDENT_AMBULATORY_CARE_PROVIDER_SITE_OTHER): Payer: Medicaid Other | Admitting: Orthopedic Surgery

## 2019-01-02 ENCOUNTER — Other Ambulatory Visit: Payer: Self-pay

## 2019-01-02 DIAGNOSIS — M545 Low back pain, unspecified: Secondary | ICD-10-CM

## 2019-01-02 MED ORDER — MELOXICAM 7.5 MG PO TABS: 7.5000 mg | ORAL_TABLET | Freq: Every day | ORAL | 0 refills | Status: DC

## 2019-01-03 ENCOUNTER — Encounter: Payer: Self-pay | Admitting: Orthopedic Surgery

## 2019-01-03 NOTE — Progress Notes (Signed)
Office Visit Note   Patient: Rhonda Higgins           Date of Birth: [birth year]           MRN: [id number] Visit Date: 01/02/2019 Requested by: [name], MD [address] [place],  [place] [address] PCP: [name], MD  Subjective: Chief Complaint  Patient presents with  . Lower Back - Pain    HPI: Pt is a 48 y.o. Female who presents to the clinic complaining of Back pain.  Back pain has been ongoing for several months but in the last week patient has noticed an addition symptom of low T-spine pain with tingling radiating to the R side of her back from the T-spine.  Pt states this pain came on suddenly one week ago, without history of injury, though she has been lifting while taking care of her father-in-law at home.  The pain comes and goes and does not radiate around to the front of her torso.  Denies radicular symptoms down her arms or legs.  She was seen by me previously for back pain and had success with PT, her HEP has not provided much improvement. She has been taking Tylenol without much improvement.  She has not tried taking an NSAID.               ROS: All systems reviewed are negative as they relate to the chief complaint within the history of present illness.  Patient denies  fevers or chills.   Assessment & Plan: Visit Diagnoses:  1. Low back pain, unspecified back pain laterality, unspecified chronicity, unspecified whether sciatica present     Plan: Pt has a history of back pain that has not resolved.  She has tried PT without significant relief.  With a long history of persistent back pain as well as the new tingling in her back, I recommended an MRI.  The tingling may be a radiculopathy, but it is difficult to determine if it is a true radiculopathy on exam.  Advanced imaging will provide more information.  If there are positive findings, we will discuss proceeding with ESI's.  For now, prescribed Mobic 7.5mg  qd.  Pt will f/u after the MRI.    Follow-Up  Instructions: No follow-ups on file.   Orders:  Orders Placed This Encounter  Procedures  . XR Lumbar Spine 2-3 Views  . MR Lumbar Spine w/o contrast   Meds ordered this encounter  Medications  . meloxicam (MOBIC) 7.5 MG tablet    Sig: Take 1 tablet (7.5 mg total) by mouth daily.    Dispense:  30 tablet    Refill:  0  . meloxicam (MOBIC) 7.5 MG tablet    Sig: Take 1 tablet (7.5 mg total) by mouth daily.    Dispense:  30 tablet    Refill:  0      Procedures: No procedures performed   Clinical Data: No additional findings.  Objective: Vital Signs: There were no vitals taken for this visit.  Physical Exam:   Constitutional: Patient appears well-developed HEENT:  Head: Normocephalic Eyes:EOM are normal Neck: Normal range of motion Cardiovascular: Normal rate Pulmonary/chest: Effort normal Neurologic: Patient is alert Skin: Skin is warm Psychiatric: Patient has normal mood and affect    Ortho Exam:  TTP over the axial lumbar spine and low T-spine as well as the paraspinal muscles SLR negative for classic radiculopathy bilaterally, but causes stretch and tingling along the T-spine when performed on her R leg No loss  in sensation throughout the legs Bilateral 5/5 strength of grip, opposition, wrist flex/extension, biceps, triceps, and deltoid Bilateral 5/5 strength of hip flexor, quad, hamstring, dorsiflex/plantar flexion  Specialty Comments:  No specialty comments available.  Imaging: No results found.   PMFS History: Patient Active Problem List   Diagnosis Date Noted  . Cholelithiasis with chronic cholecystitis 09/06/2017  . Multinodular goiter (nontoxic) 11/17/2015  . Chest pain 07/20/2014  . Diabetes mellitus without complication ([place]) 84/16/6063  . GERD (gastroesophageal reflux disease) 07/19/2014  . Personality disorder ([place])   . Uterine cancer ([place]) 06/22/2011  . DM 02/09/2010  . HLD (hyperlipidemia) 02/09/2010  . Obstructive sleep apnea  02/09/2010  . Allergic rhinitis 02/09/2010  . History of uterine cancer 02/09/2010   Past Medical History:  Diagnosis Date  . Adenocarcinoma (HCC)    endometrial, FIGO GRADE 1  . Allergic rhinitis   . Atypical chest pain    History of  . Depression   . Elevated liver enzymes   . GERD (gastroesophageal reflux disease)   . Hematuria   . History of endometrial cancer 08-02-2009   oncologist-  dr brewster/ Denman George and dr kinard/  no recurrence   endometrial adenocarinoma Stage 1B, Grade 1, FIGO--  s/p  TAH w/ BSO and pelvic lymph node dissection's and radiation therapy  . History of kidney stones   . History of radiation therapy    2011  pelvic intracavity brachytherapy treatment's for endometrial carcinoma  . History of thyroid nodule    multinodular goiter s/p  total thyroidectomy 11-19-2015  per pathology -  adenomatoid nodules  . Hyperlipidemia   . Hypothyroidism, postsurgical   . Insulin dependent diabetes mellitus ([place])    Type 2  . Left ureteral stone   . Obesity   . OSA (obstructive sleep apnea)    severe OSA  per study 03-08-2010--  noncomplant cpap  . Overactive bladder   . Personality disorder ([place])   . Polyphagia(783.6)   . PONV (postoperative nausea and vomiting)    after ear surgery only one time  . Right lower quadrant pain   . Urgency of urination   . UTI (urinary tract infection)     Family History  Problem Relation Age of Onset  . Heart disease Sister   . Heart attack Brother   . Heart disease Brother   . Heart disease Sister   . Diabetes Sister   . Asthma Mother   . Heart disease Mother   . Diabetes Mother   . Emphysema Mother   . Hypertension Mother   . Stroke Mother   . Prostate cancer Father     Past Surgical History:  Procedure Laterality Date  . CARDIOVASCULAR STRESS TEST  06/09/2008   normal nuclear study w/ no ischemia/  normal LV function and wall motion , ef 83%  . CHOLECYSTECTOMY N/A 09/06/2017   Procedure: LAPAROSCOPIC CHOLECYSTECTOMY  WITH INTRAOPERATIVE CHOLANGIOGRAM;  Surgeon: Armandina Gemma, MD;  Location: [facility];  Service: General;  Laterality: N/A;  . CYSTOSCOPY/RETROGRADE/URETEROSCOPY/STONE EXTRACTION WITH BASKET Left 03/08/2016   Procedure: CYSTOSCOPY/RETROGRADE/URETEROSCOPY/STONE EXTRACTION WITH BASKET, STENT PLACEMENT;  Surgeon: Rana Snare, MD;  Location: [facility];  Service: Urology;  Laterality: Left;  . ENDOMETRIAL BIOPSY    . ERCP N/A 09/07/2017   Procedure: ENDOSCOPIC RETROGRADE CHOLANGIOPANCREATOGRAPHY (ERCP);  Surgeon: [name], MD;  Location: [facility];  Service: Gastroenterology;  Laterality: N/A;  . HOLMIUM LASER APPLICATION Left 0/16/0109   Procedure: HOLMIUM LASER APPLICATION;  Surgeon: Rana Snare, MD;  Location:  [facility];  Service: Urology;  Laterality: Left;  . MOUTH SURGERY    . MYRINGECOTMY W/ REMOVAL MIDDLE EAR CHOLESTEATOMA TYPE 1 FASICA TYMPANOPLASTY  09/13/2000  . ROBOTIC ASSISTED TOTAL HYSTERECTOMY WITH BILATERAL SALPINGO OOPHERECTOMY  08-02-2009   at [facility]  dr Denman George   w/  Bilateral pelvic and para aortic lymph node dissection's  . THYROIDECTOMY N/A 11/19/2015   Procedure: TOTAL THYROIDECTOMY;  Surgeon: Armandina Gemma, MD;  Location: [facility];  Service: General;  Laterality: N/A;  . TONSILLECTOMY  age 56  . TRANSTHORACIC ECHOCARDIOGRAM  07/19/2014   ef 55-60%/  trivial TR  . TYMPANOPLASTY Right 1993   Social History   Occupational History  . Occupation: n/a  Tobacco Use  . Smoking status: Never Smoker  . Smokeless tobacco: Never Used  Substance and Sexual Activity  . Alcohol use: No  . Drug use: No  . Sexual activity: Yes    Birth control/protection: None

## 2019-01-17 ENCOUNTER — Ambulatory Visit: Payer: Medicaid Other | Admitting: Orthopedic Surgery

## 2019-01-31 ENCOUNTER — Ambulatory Visit: Payer: Medicaid Other | Admitting: Podiatry

## 2019-02-01 ENCOUNTER — Other Ambulatory Visit: Payer: Self-pay

## 2019-02-01 ENCOUNTER — Ambulatory Visit
Admission: RE | Admit: 2019-02-01 | Discharge: 2019-02-01 | Disposition: A | Discharge status: Home / Self Care | Payer: Medicaid Other | Source: Ambulatory Visit | Attending: Orthopedic Surgery | Admitting: Orthopedic Surgery

## 2019-02-01 DIAGNOSIS — M545 Low back pain, unspecified: Secondary | ICD-10-CM

## 2019-02-01 IMAGING — MR MRI LUMBAR SPINE WITHOUT CONTRAST
4 of 5 series · 25 of 48 positions shown · non-contrast
Comparison: Lumbar spine x-rays dated [DATE].

CLINICAL DATA: Low back pain radiating into both legs for the past
8 months.

EXAM:
MRI LUMBAR SPINE WITHOUT CONTRAST
TECHNIQUE: Multiplanar, multisequence MR imaging of the lumbar spine was
performed. No intravenous contrast was administered.

[Series 2: T2 · sagittal · 4.0mm · 0.55mm/px · 6 of 16 slices shown (1 of 2)]
[im 1/16]
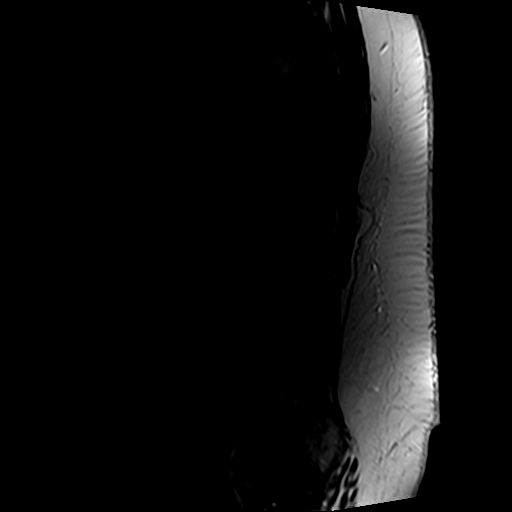
[im 4/16]
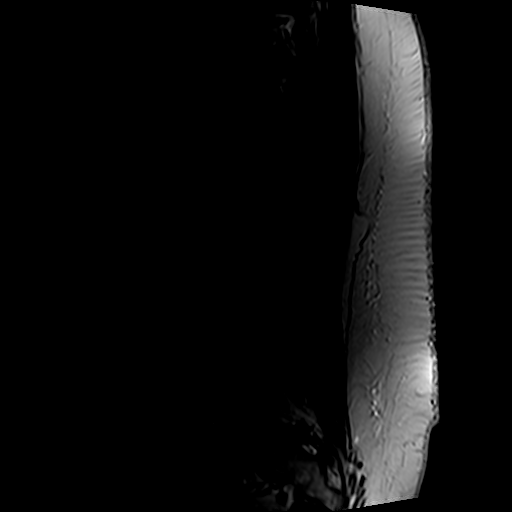
[im 7/16]
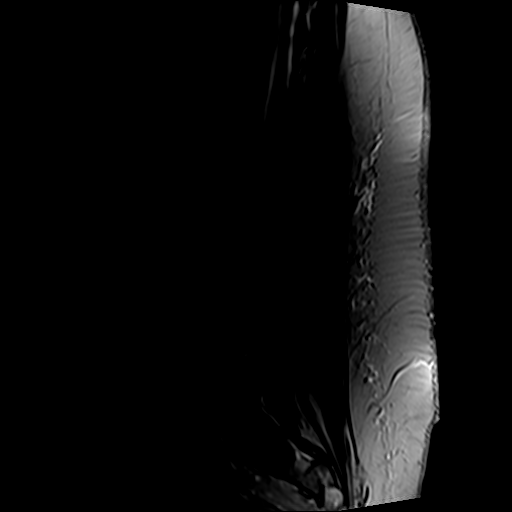
[im 10/16]
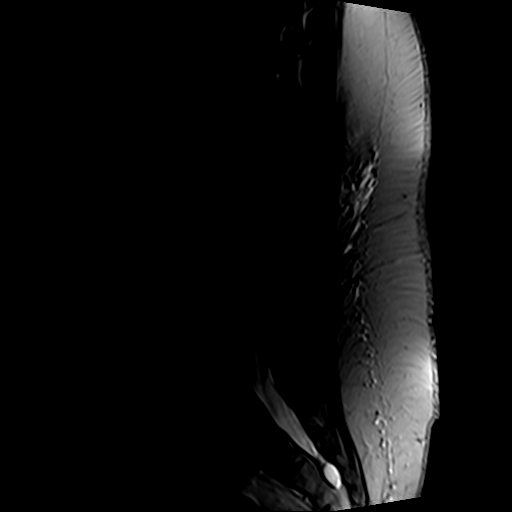
[im 13/16]
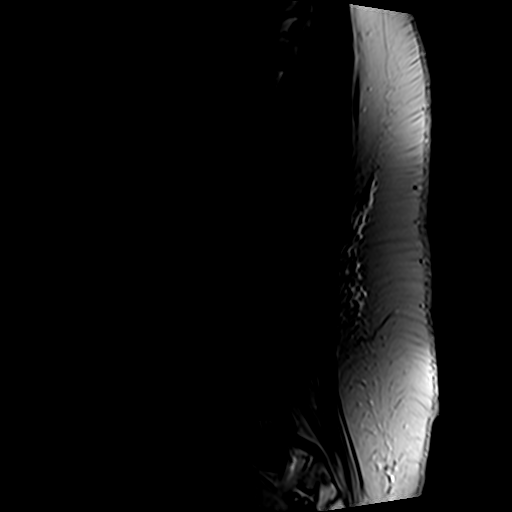
[im 16/16]
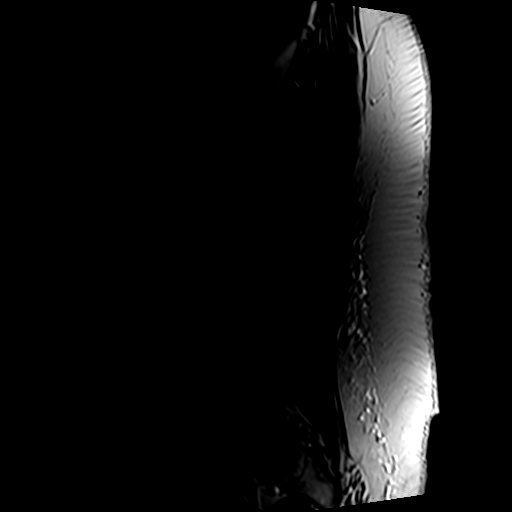

[Series 4: T1 · sagittal · 4.0mm · 0.55mm/px · 6 of 16 slices shown (1 of 2)]
[im 1/16]
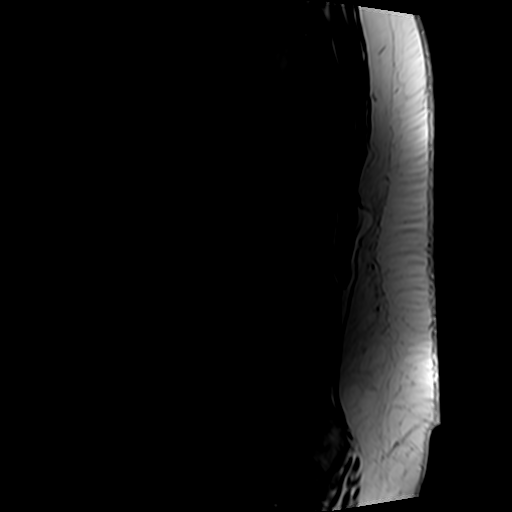
[im 4/16]
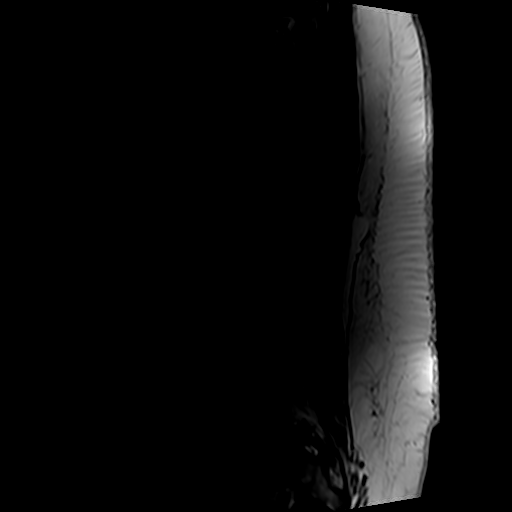
[im 7/16]
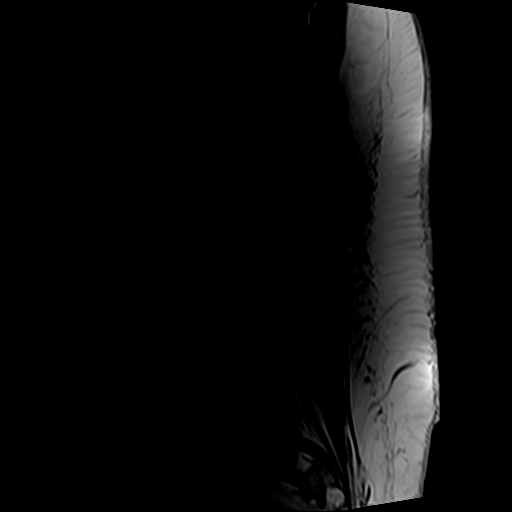
[im 10/16]
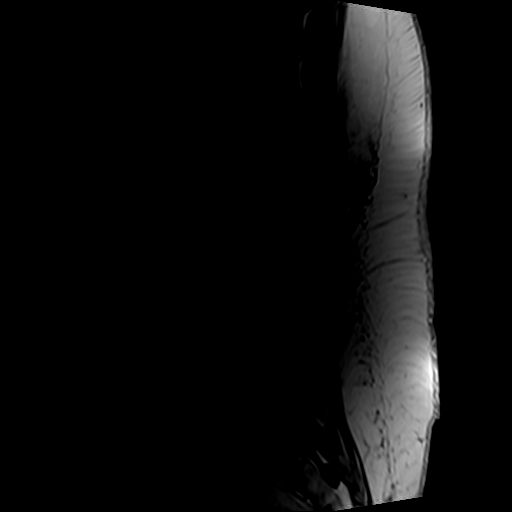
[im 13/16]
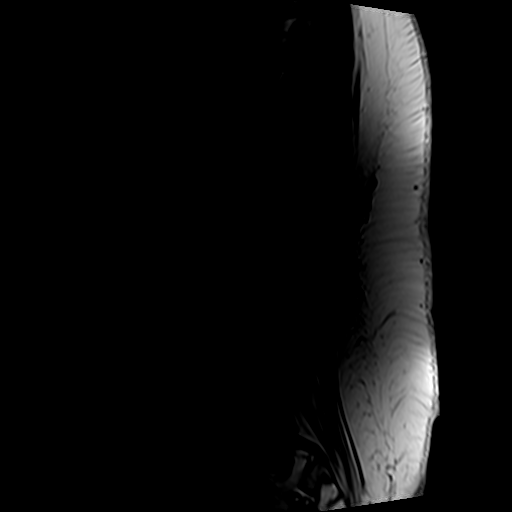
[im 16/16]
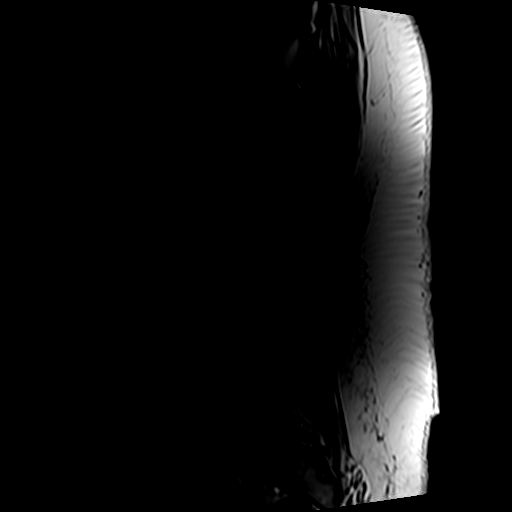

[Series 5: T2 · axial · 4.0mm · 0.70mm/px · z∈[-111,+120]mm · 9 of 42 slices shown (2 of 2)]
[im 1/42]
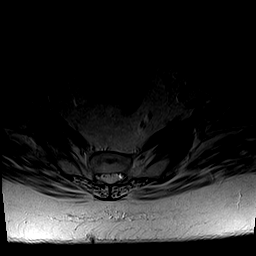
[im 6/42]
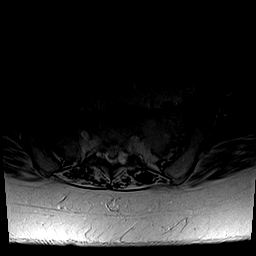
[im 12/42]
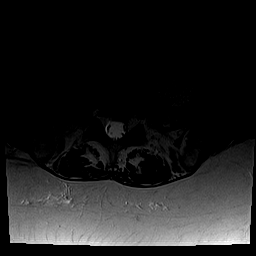
[im 18/42]
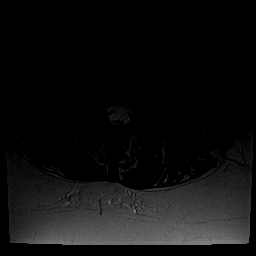
[im 21/42]
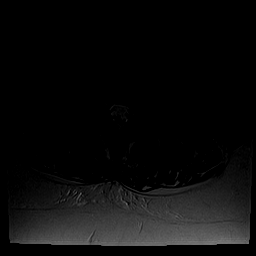
[im 24/42]
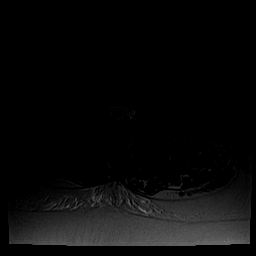
[im 30/42]
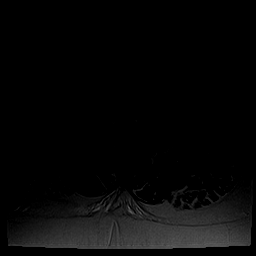
[im 36/42]
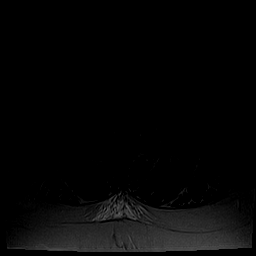
[im 42/42]
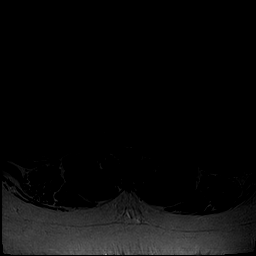

[Series 6: T1 · axial · 4.0mm · 0.35mm/px · z∈[-111,+89]mm · 4 of 42 slices shown (2 of 2)]
[im 1/42]
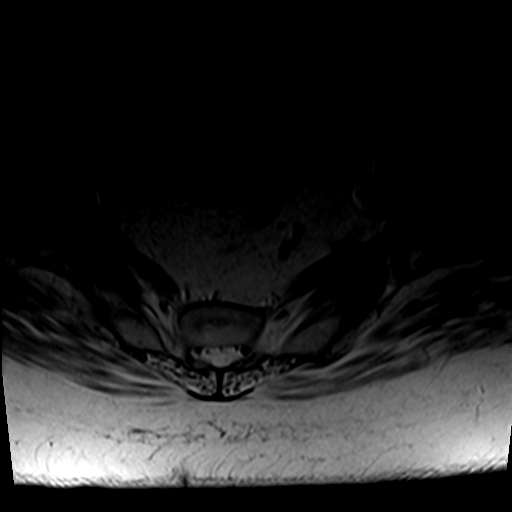
[im 6/42]
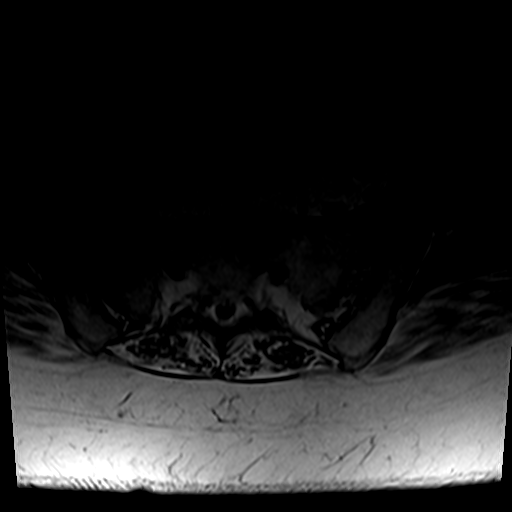
[im 21/42]
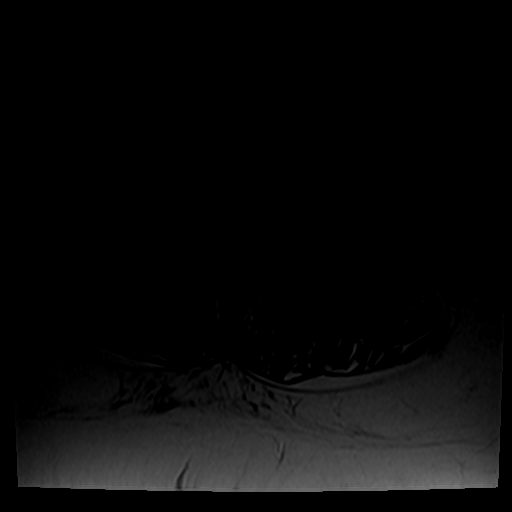
[im 36/42]
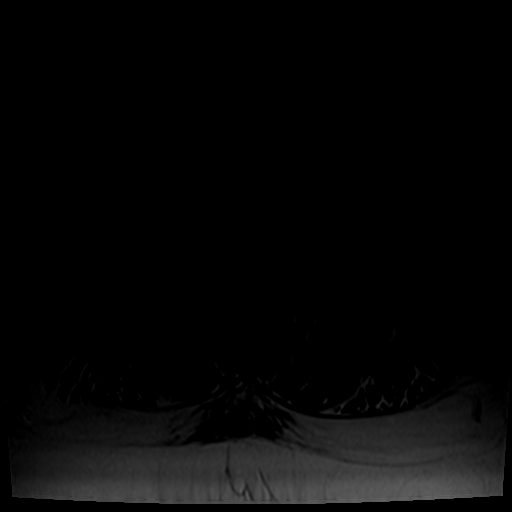

[25 of 48 positions shown; findings below may reference images not displayed]

FINDINGS: Segmentation: Partial lumbarization of S1 with rudimentary S1-S2
disc space.

Alignment:  Physiologic.

Vertebrae:  No fracture, evidence of discitis, or bone lesion.

Conus medullaris and cauda equina: Conus extends to the L2 level.
Conus and cauda equina appear normal.

Paraspinal and other soft tissues: Small sacral Tarlov cyst.
Otherwise negative.

Disc levels:

T12-L1:  Negative.

L1-L2:  Negative.

L2-L3: Disc desiccation without significant disc bulge or
herniation. No stenosis.

L3-L4: Disc desiccation without significant disc bulge or
herniation. No stenosis.

L4-L5:  Small left foraminal disc protrusion.  No stenosis.

L5-S1: Disc desiccation with tiny shallow broad-based left
subarticular and foraminal disc protrusion. No stenosis.
IMPRESSION: 1. Mild degenerative disc disease as described above. No stenosis or
impingement.

## 2019-02-03 ENCOUNTER — Ambulatory Visit (INDEPENDENT_AMBULATORY_CARE_PROVIDER_SITE_OTHER): Payer: Medicaid Other | Admitting: Orthopedic Surgery

## 2019-02-03 ENCOUNTER — Encounter: Payer: Self-pay | Admitting: Orthopedic Surgery

## 2019-02-03 ENCOUNTER — Ambulatory Visit: Payer: Medicaid Other | Admitting: Podiatry

## 2019-02-03 DIAGNOSIS — M545 Low back pain, unspecified: Secondary | ICD-10-CM

## 2019-02-05 ENCOUNTER — Encounter: Payer: Self-pay | Admitting: Orthopedic Surgery

## 2019-02-05 NOTE — Progress Notes (Signed)
Office Visit Note   Patient: Rhonda Higgins           Date of Birth: May 06, 1971           MRN: [id number] Visit Date: 02/03/2019 Requested by: Rhonda Cowman, MD [address] [place],  [place] [address] PCP: Rhonda Cowman, MD  Subjective: Chief Complaint  Patient presents with  . Follow-up    HPI: Rhonda Higgins is a patient with low back pain.  She is here after her MRI scan.  Scan shows mild degenerative changes but no nerve root compression and nothing operative.  In general Rhonda Higgins clinically is doing better.  She is not a big pill taker.  She is not really having any night pain or radicular symptoms.              ROS: All systems reviewed are negative as they relate to the chief complaint within the history of present illness.  Patient denies  fevers or chills.   Assessment & Plan: Visit Diagnoses:  1. Low back pain, unspecified back pain laterality, unspecified chronicity, unspecified whether sciatica present     Plan: Impression is low back pain with MRI scan that shows only mild degenerative changes.  Nothing that really warrants injection or further intervention.  I think if she does have recurrent symptoms I would favor either Medrol Dosepak or a course of anti-inflammatories and muscle relaxers.  I think core strengthening and weight loss would also be helpful for the long-term management of this problem.  Follow-Up Instructions: Return if symptoms worsen or fail to improve.   Orders:  No orders of the defined types were placed in this encounter.  No orders of the defined types were placed in this encounter.     Procedures: No procedures performed   Clinical Data: No additional findings.  Objective: Vital Signs: There were no vitals taken for this visit.  Physical Exam:   Constitutional: Patient appears well-developed HEENT:  Head: Normocephalic Eyes:EOM are normal Neck: Normal range of motion Cardiovascular: Normal rate Pulmonary/chest: Effort normal  Neurologic: Patient is alert Skin: Skin is warm Psychiatric: Patient has normal mood and affect    Ortho Exam: Ortho exam demonstrates good range of motion of the knees ankles and hips.  No nerve root tension signs.  No skin changes in the back.  No trochanteric tenderness is present.  Patient walks well and is able to get out of a chair without difficulty.  Specialty Comments:  No specialty comments available.  Imaging: No results found.   PMFS History: Patient Active Problem List   Diagnosis Date Noted  . Cholelithiasis with chronic cholecystitis 09/06/2017  . Multinodular goiter (nontoxic) 11/17/2015  . Chest pain 07/20/2014  . Diabetes mellitus without complication ([place]) 65/99/3570  . GERD (gastroesophageal reflux disease) 07/19/2014  . Personality disorder ([place])   . Uterine cancer ([place]) 06/22/2011  . DM 02/09/2010  . HLD (hyperlipidemia) 02/09/2010  . Obstructive sleep apnea 02/09/2010  . Allergic rhinitis 02/09/2010  . History of uterine cancer 02/09/2010   Past Medical History:  Diagnosis Date  . Adenocarcinoma (HCC)    endometrial, FIGO GRADE 1  . Allergic rhinitis   . Atypical chest pain    History of  . Depression   . Elevated liver enzymes   . GERD (gastroesophageal reflux disease)   . Hematuria   . History of endometrial cancer 08-02-2009   oncologist-  dr brewster/ [place] George and dr kinard/  no recurrence   endometrial adenocarinoma Stage 1B, Grade 1,  FIGO--  s/p  TAH w/ BSO and pelvic lymph node dissection's and radiation therapy  . History of kidney stones   . History of radiation therapy    2011  pelvic intracavity brachytherapy treatment's for endometrial carcinoma  . History of thyroid nodule    multinodular goiter s/p  total thyroidectomy 11-19-2015  per pathology -  adenomatoid nodules  . Hyperlipidemia   . Hypothyroidism, postsurgical   . Insulin dependent diabetes mellitus ([place])    Type 2  . Left ureteral stone   . Obesity   . OSA (obstructive  sleep apnea)    severe OSA  per study 03-08-2010--  noncomplant cpap  . Overactive bladder   . Personality disorder ([place])   . Polyphagia(783.6)   . PONV (postoperative nausea and vomiting)    after ear surgery only one time  . Right lower quadrant pain   . Urgency of urination   . UTI (urinary tract infection)     Family History  Problem Relation Age of Onset  . Heart disease Sister   . Heart attack Brother   . Heart disease Brother   . Heart disease Sister   . Diabetes Sister   . Asthma Mother   . Heart disease Mother   . Diabetes Mother   . Emphysema Mother   . Hypertension Mother   . Stroke Mother   . Prostate cancer Father     Past Surgical History:  Procedure Laterality Date  . CARDIOVASCULAR STRESS TEST  06/09/2008   normal nuclear study w/ no ischemia/  normal LV function and wall motion , ef 83%  . CHOLECYSTECTOMY N/A 09/06/2017   Procedure: LAPAROSCOPIC CHOLECYSTECTOMY WITH INTRAOPERATIVE CHOLANGIOGRAM;  Surgeon: Armandina Gemma, MD;  Location: [facility];  Service: General;  Laterality: N/A;  . CYSTOSCOPY/RETROGRADE/URETEROSCOPY/STONE EXTRACTION WITH BASKET Left 03/08/2016   Procedure: CYSTOSCOPY/RETROGRADE/URETEROSCOPY/STONE EXTRACTION WITH BASKET, STENT PLACEMENT;  Surgeon: Rana Snare, MD;  Location[facility];  Service: Urology;  Laterality: Left;  . ENDOMETRIAL BIOPSY    . ERCP N/A 09/07/2017   Procedure: ENDOSCOPIC RETROGRADE CHOLANGIOPANCREATOGRAPHY (ERCP);  Surgeon: [name], MD;  Location: [facility];  Service: Gastroenterology;  Laterality: N/A;  . HOLMIUM LASER APPLICATION Left 10/11/9561   Procedure: HOLMIUM LASER APPLICATION;  Surgeon: Rana Snare, MD;  Location: [facility];  Service: Urology;  Laterality: Left;  . MOUTH SURGERY    . MYRINGECOTMY W/ REMOVAL MIDDLE EAR CHOLESTEATOMA TYPE 1 FASICA TYMPANOPLASTY  09/13/2000  . ROBOTIC ASSISTED TOTAL HYSTERECTOMY WITH BILATERAL SALPINGO OOPHERECTOMY  08-02-2009   at [facility]  dr  Denman George   w/  Bilateral pelvic and para aortic lymph node dissection's  . THYROIDECTOMY N/A 11/19/2015   Procedure: TOTAL THYROIDECTOMY;  Surgeon: Armandina Gemma, MD;  Location: [facility];  Service: General;  Laterality: N/A;  . TONSILLECTOMY  age 60  . TRANSTHORACIC ECHOCARDIOGRAM  07/19/2014   ef 55-60%/  trivial TR  . TYMPANOPLASTY Right 1993   Social History   Occupational History  . Occupation: n/a  Tobacco Use  . Smoking status: Never Smoker  . Smokeless tobacco: Never Used  Substance and Sexual Activity  . Alcohol use: No  . Drug use: No  . Sexual activity: Yes    Birth control/protection: None

## 2019-02-18 ENCOUNTER — Other Ambulatory Visit: Payer: Self-pay

## 2019-02-18 ENCOUNTER — Encounter: Payer: Self-pay | Admitting: Podiatry

## 2019-02-18 ENCOUNTER — Ambulatory Visit: Payer: Medicaid Other | Admitting: Podiatry

## 2019-02-18 DIAGNOSIS — B351 Tinea unguium: Secondary | ICD-10-CM | POA: Diagnosis not present

## 2019-02-18 DIAGNOSIS — M79674 Pain in right toe(s): Secondary | ICD-10-CM

## 2019-02-18 DIAGNOSIS — M79675 Pain in left toe(s): Secondary | ICD-10-CM

## 2019-02-18 NOTE — Patient Instructions (Signed)
Diabetes Mellitus and Foot Care Foot care is an important part of your health, especially when you have diabetes. Diabetes may cause you to have problems because of poor blood flow (circulation) to your feet and legs, which can cause your skin to:  Become thinner and drier.  Break more easily.  Heal more slowly.  Peel and crack. You may also have nerve damage (neuropathy) in your legs and feet, causing decreased feeling in them. This means that you may not notice minor injuries to your feet that could lead to more serious problems. Noticing and addressing any potential problems early is the best way to prevent future foot problems. How to care for your feet Foot hygiene  Wash your feet daily with warm water and mild soap. Do not use hot water. Then, pat your feet and the areas between your toes until they are completely dry. Do not soak your feet as this can dry your skin.  Trim your toenails straight across. Do not dig under them or around the cuticle. File the edges of your nails with an emery board or nail file.  Apply a moisturizing lotion or petroleum jelly to the skin on your feet and to dry, brittle toenails. Use lotion that does not contain alcohol and is unscented. Do not apply lotion between your toes. Shoes and socks  Wear clean socks or stockings every day. Make sure they are not too tight. Do not wear knee-high stockings since they may decrease blood flow to your legs.  Wear shoes that fit properly and have enough cushioning. Always look in your shoes before you put them on to be sure there are no objects inside.  To break in new shoes, wear them for just a few hours a day. This prevents injuries on your feet. Wounds, scrapes, corns, and calluses  Check your feet daily for blisters, cuts, bruises, sores, and redness. If you cannot see the bottom of your feet, use a mirror or ask someone for help.  Do not cut corns or calluses or try to remove them with medicine.  If you  find a minor scrape, cut, or break in the skin on your feet, keep it and the skin around it clean and dry. You may clean these areas with mild soap and water. Do not clean the area with peroxide, alcohol, or iodine.  If you have a wound, scrape, corn, or callus on your foot, look at it several times a day to make sure it is healing and not infected. Check for: ? Redness, swelling, or pain. ? Fluid or blood. ? Warmth. ? Pus or a bad smell. General instructions  Do not cross your legs. This may decrease blood flow to your feet.  Do not use heating pads or hot water bottles on your feet. They may burn your skin. If you have lost feeling in your feet or legs, you may not know this is happening until it is too late.  Protect your feet from hot and cold by wearing shoes, such as at the beach or on hot pavement.  Schedule a complete foot exam at least once a year (annually) or more often if you have foot problems. If you have foot problems, report any cuts, sores, or bruises to your health care provider immediately. Contact a health care provider if:  You have a medical condition that increases your risk of infection and you have any cuts, sores, or bruises on your feet.  You have an injury that is not   healing.  You have redness on your legs or feet.  You feel burning or tingling in your legs or feet.  You have pain or cramps in your legs and feet.  Your legs or feet are numb.  Your feet always feel cold.  You have pain around a toenail. Get help right away if:  You have a wound, scrape, corn, or callus on your foot and: ? You have pain, swelling, or redness that gets worse. ? You have fluid or blood coming from the wound, scrape, corn, or callus. ? Your wound, scrape, corn, or callus feels warm to the touch. ? You have pus or a bad smell coming from the wound, scrape, corn, or callus. ? You have a fever. ? You have a red line going up your leg. Summary  Check your feet every day  for cuts, sores, red spots, swelling, and blisters.  Moisturize feet and legs daily.  Wear shoes that fit properly and have enough cushioning.  If you have foot problems, report any cuts, sores, or bruises to your health care provider immediately.  Schedule a complete foot exam at least once a year (annually) or more often if you have foot problems. This information is not intended to replace advice given to you by your health care provider. Make sure you discuss any questions you have with your health care provider. Document Released: 06/02/2000 Document Revised: 07/18/2017 Document Reviewed: 07/07/2016 Elsevier Patient Education  2020 [employer]   Onychomycosis/Fungal Toenails  WHAT IS IT? An infection that lies within the keratin of your nail plate that is caused by a fungus.  WHY ME? Fungal infections affect all ages, sexes, races, and creeds.  There may be many factors that predispose you to a fungal infection such as age, coexisting medical conditions such as diabetes, or an autoimmune disease; stress, medications, fatigue, genetics, etc.  Bottom line: fungus thrives in a warm, moist environment and your shoes offer such a location.  IS IT CONTAGIOUS? Theoretically, yes.  You do not want to share shoes, nail clippers or files with someone who has fungal toenails.  Walking around barefoot in the same room or sleeping in the same bed is unlikely to transfer the organism.  It is important to realize, however, that fungus can spread easily from one nail to the next on the same foot.  HOW DO WE TREAT THIS?  There are several ways to treat this condition.  Treatment may depend on many factors such as age, medications, pregnancy, liver and kidney conditions, etc.  It is best to ask your doctor which options are available to you.  1. No treatment.   Unlike many other medical concerns, you can live with this condition.  However for many people this can be a painful condition and may lead to  ingrown toenails or a bacterial infection.  It is recommended that you keep the nails cut short to help reduce the amount of fungal nail. 2. Topical treatment.  These range from herbal remedies to prescription strength nail lacquers.  About 40-50% effective, topicals require twice daily application for approximately 9 to 12 months or until an entirely new nail has grown out.  The most effective topicals are medical grade medications available through physicians offices. 3. Oral antifungal medications.  With an 80-90% cure rate, the most common oral medication requires 3 to 4 months of therapy and stays in your system for a year as the new nail grows out.  Oral antifungal medications do require   blood work to make sure it is a safe drug for you.  A liver function panel will be performed prior to starting the medication and after the first month of treatment.  It is important to have the blood work performed to avoid any harmful side effects.  In general, this medication safe but blood work is required. 4. Laser Therapy.  This treatment is performed by applying a specialized laser to the affected nail plate.  This therapy is noninvasive, fast, and non-painful.  It is not covered by insurance and is therefore, out of pocket.  The results have been very good with a 80-95% cure rate.  The [facility] is the only practice in the area to offer this therapy. 5. Permanent Nail Avulsion.  Removing the entire nail so that a new nail will not grow back. 

## 2019-02-25 NOTE — Progress Notes (Signed)
Subjective: Rhonda Higgins is seen today for follow up painful, elongated, thickened toenails 1-5 b/l feet that she cannot cut. Pain interferes with daily activities. Aggravating factor includes wearing enclosed shoe gear and relieved with periodic debridement.  Objective:  Neurovascular status unchanged: Vascular Examination: Capillary refill time immediate x 10 digits.  Dorsalis pedis present b/l.  Posterior tibial pulses present b/l.  Digital hair absent b/l.  Skin temperature gradient WNL b/l.  Sensation diminished with 10 gram monofilament b/l.  Dermatological Examination: Skin with normal turgor, texture and tone b/l.  Toenails 1-5 b/l discolored, thick, dystrophic with subungual debris and pain with palpation to nailbeds due to thickness of nails.  Incurvated nailplate b/l great toes with tenderness to palpation. No erythema, no edema, no drainage noted.  Musculoskeletal: Muscle strength 5/5 to all LE muscle groups  No gross bony deformities b/l.  No pain, crepitus or joint limitation noted with ROM.   Assessment: Painful onychomycosis toenails 1-5 b/l   Plan: 1. Toenails 1-5 b/l were debrided in length and girth without iatrogenic bleeding. Offending nail borders debrided and curretaged b/l great toes. Borders cleansed with alcohol. Antibiotic ointment applied. No further treatment required by patient. 2. Patient to continue soft, supportive shoe gear. 3. Patient to report any pedal injuries to medical professional immediately. 4. Follow up 3 months.  5. Patient/POA to call should there be a concern in the interim.

## 2019-04-16 ENCOUNTER — Other Ambulatory Visit (HOSPITAL_COMMUNITY): Payer: Self-pay | Admitting: Gastroenterology

## 2019-04-16 DIAGNOSIS — R6881 Early satiety: Secondary | ICD-10-CM

## 2019-04-16 DIAGNOSIS — R140 Abdominal distension (gaseous): Secondary | ICD-10-CM

## 2019-04-16 DIAGNOSIS — R194 Change in bowel habit: Secondary | ICD-10-CM

## 2019-05-01 ENCOUNTER — Ambulatory Visit (HOSPITAL_COMMUNITY): Payer: Medicaid Other

## 2019-05-02 ENCOUNTER — Ambulatory Visit (HOSPITAL_COMMUNITY)
Admission: RE | Admit: 2019-05-02 | Discharge: 2019-05-02 | Disposition: A | Discharge status: Home / Self Care | Payer: Medicaid Other | Source: Ambulatory Visit | Attending: Gastroenterology | Admitting: Gastroenterology

## 2019-05-02 ENCOUNTER — Other Ambulatory Visit: Payer: Self-pay

## 2019-05-02 DIAGNOSIS — R140 Abdominal distension (gaseous): Secondary | ICD-10-CM

## 2019-05-02 DIAGNOSIS — R6881 Early satiety: Secondary | ICD-10-CM

## 2019-05-02 DIAGNOSIS — R194 Change in bowel habit: Secondary | ICD-10-CM | POA: Diagnosis present

## 2019-05-02 MED ORDER — TECHNETIUM TC 99M SULFUR COLLOID
2.1700 | Freq: Once | INTRAVENOUS | Status: AC | PRN
  Administered 2019-05-02: 2.17 via ORAL

## 2019-05-19 ENCOUNTER — Other Ambulatory Visit: Payer: Self-pay

## 2019-05-19 ENCOUNTER — Encounter (HOSPITAL_COMMUNITY): Payer: Self-pay | Admitting: Emergency Medicine

## 2019-05-19 ENCOUNTER — Emergency Department (HOSPITAL_COMMUNITY)
Admission: EM | Admit: 2019-05-19 | Discharge: 2019-05-19 | Disposition: A | Discharge status: Home / Self Care | Payer: Medicaid Other | Attending: Emergency Medicine | Admitting: Emergency Medicine

## 2019-05-19 DIAGNOSIS — H5711 Ocular pain, right eye: Secondary | ICD-10-CM | POA: Insufficient documentation

## 2019-05-19 DIAGNOSIS — Z5321 Procedure and treatment not carried out due to patient leaving prior to being seen by health care provider: Secondary | ICD-10-CM | POA: Diagnosis not present

## 2019-05-19 NOTE — ED Triage Notes (Signed)
Patient here from home with complaints of right eye pain and redness after getting dish soap in it.

## 2019-05-19 NOTE — ED Notes (Signed)
Pt made staff aware she did not want to be seen in ED tonight. Pt states she will go to her PCP in the AM. Triage RN Taquita aware

## 2019-05-27 ENCOUNTER — Ambulatory Visit: Payer: Medicaid Other | Admitting: Podiatry

## 2019-05-27 ENCOUNTER — Encounter: Payer: Self-pay | Admitting: Podiatry

## 2019-05-27 ENCOUNTER — Other Ambulatory Visit: Payer: Self-pay

## 2019-05-27 DIAGNOSIS — M79674 Pain in right toe(s): Secondary | ICD-10-CM | POA: Diagnosis not present

## 2019-05-27 DIAGNOSIS — M792 Neuralgia and neuritis, unspecified: Secondary | ICD-10-CM

## 2019-05-27 DIAGNOSIS — B351 Tinea unguium: Secondary | ICD-10-CM | POA: Diagnosis not present

## 2019-05-27 DIAGNOSIS — M79675 Pain in left toe(s): Secondary | ICD-10-CM | POA: Diagnosis not present

## 2019-05-27 NOTE — Patient Instructions (Addendum)
Purchase Capsaicin Cream at [facility] or any drug store.  Photographer.    Diabetes Mellitus and Foot Care Foot care is an important part of your health, especially when you have diabetes. Diabetes may cause you to have problems because of poor blood flow (circulation) to your feet and legs, which can cause your skin to:  Become thinner and drier.  Break more easily.  Heal more slowly.  Peel and crack. You may also have nerve damage (neuropathy) in your legs and feet, causing decreased feeling in them. This means that you may not notice minor injuries to your feet that could lead to more serious problems. Noticing and addressing any potential problems early is the best way to prevent future foot problems. How to care for your feet Foot hygiene  Wash your feet daily with warm water and mild soap. Do not use hot water. Then, pat your feet and the areas between your toes until they are completely dry. Do not soak your feet as this can dry your skin.  Trim your toenails straight across. Do not dig under them or around the cuticle. File the edges of your nails with an emery board or nail file.  Apply a moisturizing lotion or petroleum jelly to the skin on your feet and to dry, brittle toenails. Use lotion that does not contain alcohol and is unscented. Do not apply lotion between your toes. Shoes and socks  Wear clean socks or stockings every day. Make sure they are not too tight. Do not wear knee-high stockings since they may decrease blood flow to your legs.  Wear shoes that fit properly and have enough cushioning. Always look in your shoes before you put them on to be sure there are no objects inside.  To break in new shoes, wear them for just a few hours a day. This prevents injuries on your feet. Wounds, scrapes, corns, and calluses  Check your feet daily for blisters, cuts, bruises, sores, and redness. If you cannot see the bottom of your feet, use a mirror  or ask someone for help.  Do not cut corns or calluses or try to remove them with medicine.  If you find a minor scrape, cut, or break in the skin on your feet, keep it and the skin around it clean and dry. You may clean these areas with mild soap and water. Do not clean the area with peroxide, alcohol, or iodine.  If you have a wound, scrape, corn, or callus on your foot, look at it several times a day to make sure it is healing and not infected. Check for: ? Redness, swelling, or pain. ? Fluid or blood. ? Warmth. ? Pus or a bad smell. General instructions  Do not cross your legs. This may decrease blood flow to your feet.  Do not use heating pads or hot water bottles on your feet. They may burn your skin. If you have lost feeling in your feet or legs, you may not know this is happening until it is too late.  Protect your feet from hot and cold by wearing shoes, such as at the beach or on hot pavement.  Schedule a complete foot exam at least once a year (annually) or more often if you have foot problems. If you have foot problems, report any cuts, sores, or bruises to your health care provider immediately. Contact a health care provider if:  You have a medical condition that increases your risk of infection and  you have any cuts, sores, or bruises on your feet.  You have an injury that is not healing.  You have redness on your legs or feet.  You feel burning or tingling in your legs or feet.  You have pain or cramps in your legs and feet.  Your legs or feet are numb.  Your feet always feel cold.  You have pain around a toenail. Get help right away if:  You have a wound, scrape, corn, or callus on your foot and: ? You have pain, swelling, or redness that gets worse. ? You have fluid or blood coming from the wound, scrape, corn, or callus. ? Your wound, scrape, corn, or callus feels warm to the touch. ? You have pus or a bad smell coming from the wound, scrape, corn, or  callus. ? You have a fever. ? You have a red line going up your leg. Summary  Check your feet every day for cuts, sores, red spots, swelling, and blisters.  Moisturize feet and legs daily.  Wear shoes that fit properly and have enough cushioning.  If you have foot problems, report any cuts, sores, or bruises to your health care provider immediately.  Schedule a complete foot exam at least once a year (annually) or more often if you have foot problems. This information is not intended to replace advice given to you by your health care provider. Make sure you discuss any questions you have with your health care provider. Document Released: 06/02/2000 Document Revised: 07/18/2017 Document Reviewed: 07/07/2016 Elsevier Patient Education  2020 [employer].

## 2019-06-01 NOTE — Progress Notes (Signed)
Subjective: Rhonda Higgins presents today with painful, thick toenails 1-5 b/l that she cannot cut and which interfere with daily activities.  Pain is aggravated when wearing enclosed shoe gear.  She relates sharp shooting pain on plantar aspect of both feet. Denies any episodes of trauma.   Rhonda Cowman, MD is her PCP.   Current Outpatient Medications on File Prior to Visit  Medication Sig Dispense Refill  . acetaminophen (TYLENOL) 500 MG tablet Take 1,000 mg by mouth every 6 (six) hours as needed for mild pain, moderate pain or headache.    . ALAWAY 0.025 % ophthalmic solution Apply 1 drop to eye daily.    . Ascorbic Acid (VITAMIN C) 1000 MG tablet Take 1,000 mg by mouth daily.    [name] atorvastatin (LIPITOR) 40 MG tablet Take 40 mg by mouth daily.    [name] buPROPion (WELLBUTRIN XL) 150 MG 24 hr tablet Take 150 mg by mouth daily.     . cetirizine (ZYRTEC) 10 MG tablet Take 10 mg by mouth at bedtime.     . cholecalciferol (VITAMIN D) 25 MCG (1000 UT) tablet Take 1,000 Units by mouth daily.    [name] dicyclomine (BENTYL) 20 MG tablet Take 20 mg by mouth 4 (four) times daily as needed for spasms.     [name] gabapentin (NEURONTIN) 400 MG capsule Take 400-1,200 mg by mouth See admin instructions. Pt takes one capsule in the morning, one in the afternoon, and three at bedtime.    [name] glimepiride (AMARYL) 4 MG tablet Take 4 mg by mouth 2 (two) times daily.   5  . ibuprofen (ADVIL,MOTRIN) 800 MG tablet Take 800 mg by mouth every 8 (eight) hours as needed for moderate pain.    [name] lamoTRIgine (LAMICTAL) 200 MG tablet Take 200 mg by mouth at bedtime.   2  . LANTUS SOLOSTAR 100 UNIT/ML Solostar Pen Inject 30 Units into the skin at bedtime.   0  . lisinopril (PRINIVIL,ZESTRIL) 5 MG tablet Take 5 mg by mouth at bedtime.     . meloxicam (MOBIC) 7.5 MG tablet Take 1 tablet (7.5 mg total) by mouth daily. 30 tablet 0  . meloxicam (MOBIC) 7.5 MG tablet Take 1 tablet (7.5 mg total) by mouth daily. 30 tablet 0  . Multiple  Vitamins-Minerals (MULTIVITAMIN WITH MINERALS) tablet Take 1 tablet by mouth every evening.     . nitroGLYCERIN (NITROSTAT) 0.4 MG SL tablet Place 1 tablet (0.4 mg total) under the tongue every 5 (five) minutes as needed for chest pain. 30 tablet 0  . ondansetron (ZOFRAN) 4 MG tablet Take 4 mg by mouth 3 (three) times daily as needed for nausea.     . pantoprazole (PROTONIX) 40 MG tablet Take 40 mg by mouth every morning.   0  . PRED FORTE 1 % ophthalmic suspension 1 drop 4 (four) times daily.    . risperiDONE (RISPERDAL) 2 MG tablet Take 2 mg by mouth daily.   2  . simvastatin (ZOCOR) 40 MG tablet Take 40 mg by mouth at bedtime.     . sitaGLIPtin-metformin (JANUMET) 50-1000 MG tablet Take 1 tablet by mouth 2 (two) times daily with a meal.    . SYNTHROID 150 MCG tablet Take 150 mcg by mouth daily.    [name] SYNTHROID 175 MCG tablet Take 175 mcg by mouth daily before breakfast.     . VICTOZA 18 MG/3ML SOPN Inject 1.2 mg into the skin daily.      No current facility-administered medications on file  prior to visit.    Allergies  Allergen Reactions  . Hydrocodone Shortness Of Breath  . Tape Rash    Paper tape is ok  . Hydrocodone-Acetaminophen Itching  . Other Itching  . Codeine Itching   Objective: There were no vitals filed for this visit.  Vascular Examination: Capillary refill time to digits immediate b/l.  Dorsalis pedis and posterior tibial pulses palpable b/l.  Digital hair absent b/l.   Skin temperature gradient WNL b/l.  Dermatological Examination: Skin with normal turgor, texture and tone b/l.  Toenails 1-5 b/l discolored, thick, dystrophic with subungual debris and pain with palpation to nailbeds due to thickness of nails.  Musculoskeletal: Muscle strength 5/5 to all LE muscle groups  No gross bony deformities b/l.  No pain, crepitus or joint limitation noted with ROM.   Neurological: Sensation diminished b/l with 10 gram monofilament.  Assessment: Painful  onychomycosis toenails 1-5 b/l  Neuropathic pain   Plan: 1. Toenails 1-5 b/l were debrided in length and girth without iatrogenic bleeding. 2. Recommended change in shoe gear to [employer] and also Capsaicin Cream for neuropathic pain.  3. Patient to continue soft, supportive shoe gear daily. 4. Patient to report any pedal injuries to medical professional immediately. 5. Follow up 3 months.  6. Patient/POA to call should there be a concern in the interim.

## 2019-06-26 ENCOUNTER — Other Ambulatory Visit: Payer: Self-pay | Admitting: Family Medicine

## 2019-06-26 DIAGNOSIS — Z1231 Encounter for screening mammogram for malignant neoplasm of breast: Secondary | ICD-10-CM

## 2019-07-01 ENCOUNTER — Ambulatory Visit: Payer: Medicaid Other

## 2019-08-08 ENCOUNTER — Ambulatory Visit: Payer: Medicaid Other

## 2019-08-29 ENCOUNTER — Encounter: Payer: Self-pay | Admitting: Podiatry

## 2019-08-29 ENCOUNTER — Ambulatory Visit: Payer: Medicaid Other | Admitting: Podiatry

## 2019-08-29 ENCOUNTER — Other Ambulatory Visit: Payer: Self-pay

## 2019-08-29 DIAGNOSIS — M79675 Pain in left toe(s): Secondary | ICD-10-CM

## 2019-08-29 DIAGNOSIS — B351 Tinea unguium: Secondary | ICD-10-CM

## 2019-08-29 DIAGNOSIS — M79674 Pain in right toe(s): Secondary | ICD-10-CM

## 2019-08-29 DIAGNOSIS — E1142 Type 2 diabetes mellitus with diabetic polyneuropathy: Secondary | ICD-10-CM | POA: Diagnosis not present

## 2019-08-29 DIAGNOSIS — L600 Ingrowing nail: Secondary | ICD-10-CM

## 2019-08-29 NOTE — Patient Instructions (Addendum)
EPSOM SALT FOOT SOAK INSTRUCTIONS  Shopping List:  A. Plain epsom salt (not scented) B. Neosporin Cream/Ointment or Bacitracin Cream/Ointment C. 1-inch fabric band-aids   1.  Place 1/4 cup of epsom salts in 2 quarts of warm tap water. IF YOU ARE DIABETIC, OR HAVE NEUROPATHY, CHECK THE TEMPERATURE OF THE WATER WITH YOUR ELBOW.  2.  Submerge your foot/feet in the solution and soak for 10-15 minutes.      3.  Next, remove your foot or feet from solution, blot dry the affected area.    4.  Apply antibiotic ointment and leave open to air.  5.  This soak should be done once a day for 3 days.   6.  Monitor for any signs/symptoms of infection such as redness, swelling, odor, drainage, increased pain, or non-healing of digit.   7.  Please do not hesitate to call the office and speak to a Nurse or Doctor if you have questions.   8.  If you experience fever, chills, nightsweats, nausea or vomiting with worsening of digit, please go to the emergency room.     Diabetes Mellitus and Foot Care Foot care is an important part of your health, especially when you have diabetes. Diabetes may cause you to have problems because of poor blood flow (circulation) to your feet and legs, which can cause your skin to:  Become thinner and drier.  Break more easily.  Heal more slowly.  Peel and crack. You may also have nerve damage (neuropathy) in your legs and feet, causing decreased feeling in them. This means that you may not notice minor injuries to your feet that could lead to more serious problems. Noticing and addressing any potential problems early is the best way to prevent future foot problems. How to care for your feet Foot hygiene  Wash your feet daily with warm water and mild soap. Do not use hot water. Then, pat your feet and the areas between your toes until they are completely dry. Do not soak your feet as this can dry your skin.  Trim your toenails straight across. Do not dig under them  or around the cuticle. File the edges of your nails with an emery board or nail file.  Apply a moisturizing lotion or petroleum jelly to the skin on your feet and to dry, brittle toenails. Use lotion that does not contain alcohol and is unscented. Do not apply lotion between your toes. Shoes and socks  Wear clean socks or stockings every day. Make sure they are not too tight. Do not wear knee-high stockings since they may decrease blood flow to your legs.  Wear shoes that fit properly and have enough cushioning. Always look in your shoes before you put them on to be sure there are no objects inside.  To break in new shoes, wear them for just a few hours a day. This prevents injuries on your feet. Wounds, scrapes, corns, and calluses  Check your feet daily for blisters, cuts, bruises, sores, and redness. If you cannot see the bottom of your feet, use a mirror or ask someone for help.  Do not cut corns or calluses or try to remove them with medicine.  If you find a minor scrape, cut, or break in the skin on your feet, keep it and the skin around it clean and dry. You may clean these areas with mild soap and water. Do not clean the area with peroxide, alcohol, or iodine.  If you have a wound, scrape,  corn, or callus on your foot, look at it several times a day to make sure it is healing and not infected. Check for: ? Redness, swelling, or pain. ? Fluid or blood. ? Warmth. ? Pus or a bad smell. General instructions  Do not cross your legs. This may decrease blood flow to your feet.  Do not use heating pads or hot water bottles on your feet. They may burn your skin. If you have lost feeling in your feet or legs, you may not know this is happening until it is too late.  Protect your feet from hot and cold by wearing shoes, such as at the beach or on hot pavement.  Schedule a complete foot exam at least once a year (annually) or more often if you have foot problems. If you have foot problems,  report any cuts, sores, or bruises to your health care provider immediately. Contact a health care provider if:  You have a medical condition that increases your risk of infection and you have any cuts, sores, or bruises on your feet.  You have an injury that is not healing.  You have redness on your legs or feet.  You feel burning or tingling in your legs or feet.  You have pain or cramps in your legs and feet.  Your legs or feet are numb.  Your feet always feel cold.  You have pain around a toenail. Get help right away if:  You have a wound, scrape, corn, or callus on your foot and: ? You have pain, swelling, or redness that gets worse. ? You have fluid or blood coming from the wound, scrape, corn, or callus. ? Your wound, scrape, corn, or callus feels warm to the touch. ? You have pus or a bad smell coming from the wound, scrape, corn, or callus. ? You have a fever. ? You have a red line going up your leg. Summary  Check your feet every day for cuts, sores, red spots, swelling, and blisters.  Moisturize feet and legs daily.  Wear shoes that fit properly and have enough cushioning.  If you have foot problems, report any cuts, sores, or bruises to your health care provider immediately.  Schedule a complete foot exam at least once a year (annually) or more often if you have foot problems. This information is not intended to replace advice given to you by your health care provider. Make sure you discuss any questions you have with your health care provider. Document Revised: 02/26/2019 Document Reviewed: 07/07/2016 Elsevier Patient Education  [place] An ingrown toenail occurs when the corner or sides of a toenail grow into the surrounding skin. This causes discomfort and pain. The big toe is most commonly affected, but any of the toes can be affected. If an ingrown toenail is not treated, it can become infected. What are the causes? This  condition may be caused by:  Wearing shoes that are too small or tight.  An injury, such as stubbing your toe or having your toe stepped on.  Improper cutting or care of your toenails.  Having nail or foot abnormalities that were present from birth (congenital abnormalities), such as having a nail that is too big for your toe. What increases the risk? The following factors may make you more likely to develop ingrown toenails:  Age. Nails tend to get thicker with age, so ingrown nails are more common among older people.  Cutting your toenails incorrectly, such as cutting  them very short or cutting them unevenly. An ingrown toenail is more likely to get infected if you have:  Diabetes.  Blood flow (circulation) problems. What are the signs or symptoms? Symptoms of an ingrown toenail may include:  Pain, soreness, or tenderness.  Redness.  Swelling.  Hardening of the skin that surrounds the toenail. Signs that an ingrown toenail may be infected include:  Fluid or pus.  Symptoms that get worse instead of better. How is this diagnosed? An ingrown toenail may be diagnosed based on your medical history, your symptoms, and a physical exam. If you have fluid or blood coming from your toenail, a sample may be collected to test for the specific type of bacteria that is causing the infection. How is this treated? Treatment depends on how severe your ingrown toenail is. You may be able to care for your toenail at home.  If you have an infection, you may be prescribed antibiotic medicines.  If you have fluid or pus draining from your toenail, your health care provider may drain it.  If you have trouble walking, you may be given crutches to use.  If you have a severe or infected ingrown toenail, you may need a procedure to remove part or all of the nail. Follow these instructions at home: Foot care   Do not pick at your toenail or try to remove it yourself.  Soak your foot in  warm, soapy water. Do this for 20 minutes, 3 times a day, or as often as told by your health care provider. This helps to keep your toe clean and keep your skin soft.  Wear shoes that fit well and are not too tight. Your health care provider may recommend that you wear open-toed shoes while you heal.  Trim your toenails regularly and carefully. Cut your toenails straight across to prevent injury to the skin at the corners of the toenail. Do not cut your nails in a curved shape.  Keep your feet clean and dry to help prevent infection. Medicines  Take over-the-counter and prescription medicines only as told by your health care provider.  If you were prescribed an antibiotic, take it as told by your health care provider. Do not stop taking the antibiotic even if you start to feel better. Activity  Return to your normal activities as told by your health care provider. Ask your health care provider what activities are safe for you.  Avoid activities that cause pain. General instructions  If your health care provider told you to use crutches to help you move around, use them as instructed.  Keep all follow-up visits as told by your health care provider. This is important. Contact a health care provider if:  You have more redness, swelling, pain, or other symptoms that do not improve with treatment.  You have fluid, blood, or pus coming from your toenail. Get help right away if:  You have a red streak on your skin that starts at your foot and spreads up your leg.  You have a fever. Summary  An ingrown toenail occurs when the corner or sides of a toenail grow into the surrounding skin. This causes discomfort and pain. The big toe is most commonly affected, but any of the toes can be affected.  If an ingrown toenail is not treated, it can become infected.  Fluid or pus draining from your toenail is a sign of infection. Your health care provider may need to drain it. You may be given  antibiotics  to treat the infection.  Trimming your toenails regularly and properly can help you prevent an ingrown toenail. This information is not intended to replace advice given to you by your health care provider. Make sure you discuss any questions you have with your health care provider. Document Revised: 09/27/2018 Document Reviewed: 02/21/2017 Elsevier Patient Education  [place].

## 2019-09-04 NOTE — Progress Notes (Signed)
Subjective: Rhonda Higgins presents today for follow up of preventative diabetic foot care, painful mycotic nails b/l that are difficult to trim. Pain interferes with ambulation. Aggravating factors include wearing enclosed shoe gear. Pain is relieved with periodic professional debridement and ingrown toenail to the left hallux lateral border. Patient states toe is tender when wearing enclosed shoe gear. She denies any redness, swelling or drainage.   Allergies  Allergen Reactions  . Hydrocodone Shortness Of Breath  . Tape Rash    Paper tape is ok  . Hydrocodone-Acetaminophen Itching  . Other Itching  . Codeine Itching    Objective: There were no vitals filed for this visit.  Pt 49 y.o. year old Caucasian female in NAD. AAO x 3.   Vascular Examination:  Capillary refill time to digits immediate b/l. Palpable DP pulses b/l. Palpable PT pulses b/l. Pedal hair absent b/l Skin temperature gradient within normal limits b/l.  Dermatological Examination: Pedal skin with normal turgor, texture and tone bilaterally. No open wounds bilaterally. No interdigital macerations bilaterally. Toenails 1-5 b/l elongated, dystrophic, thickened, crumbly with subungual debris and tenderness to dorsal palpation. Incurvated nailplate left hallux with nail border hypertrophy. No edema, no erythema, no drainage. There is tenderness to palpation.  Musculoskeletal: Normal muscle strength 5/5 to all lower extremity muscle groups bilaterally, no gross bony deformities bilaterally and no pain crepitus or joint limitation noted with ROM b/l.  Neurological: Protective sensation diminished with 10g monofilament b/l.  Assessment: 1. Pain due to onychomycosis of toenails of both feet   2. Ingrown toenail without infection   3. Diabetic peripheral neuropathy associated with type 2 diabetes mellitus ([place])    Plan: -Continue diabetic foot care principles. Literature dispensed on today.  -Toenails 1-5 b/l were  debrided in length and girth with sterile nail nippers and dremel without iatrogenic bleeding. Offending nail border debrided and curretaged left hallux. Border cleansed with alcohol and triple antibiotic applied. Dispensed written instructions for once daily epsom salt soaks for 3 days for ingrown toenail of left great toe.  -Patient to continue soft, supportive shoe gear daily. -Patient to report any pedal injuries to medical professional immediately. -Patient/POA to call should there be question/concern in the interim.  Return in about 3 months (around 11/29/2019) for diabetic nail trim.

## 2019-09-12 ENCOUNTER — Other Ambulatory Visit: Payer: Self-pay

## 2019-09-12 ENCOUNTER — Ambulatory Visit
Admission: RE | Admit: 2019-09-12 | Discharge: 2019-09-12 | Disposition: A | Discharge status: Home / Self Care | Payer: Medicaid Other | Source: Ambulatory Visit | Attending: Family Medicine | Admitting: Family Medicine

## 2019-09-12 DIAGNOSIS — Z1231 Encounter for screening mammogram for malignant neoplasm of breast: Secondary | ICD-10-CM

## 2019-09-12 IMAGING — MG DIGITAL SCREENING BILAT W/ TOMO W/ CAD
8 series · 8 of 24 positions shown · non-contrast
Comparison: Previous exam(s).

CLINICAL DATA: Screening.

EXAM:
DIGITAL SCREENING BILATERAL MAMMOGRAM WITH TOMO AND CAD

[L MLO synth-2D]
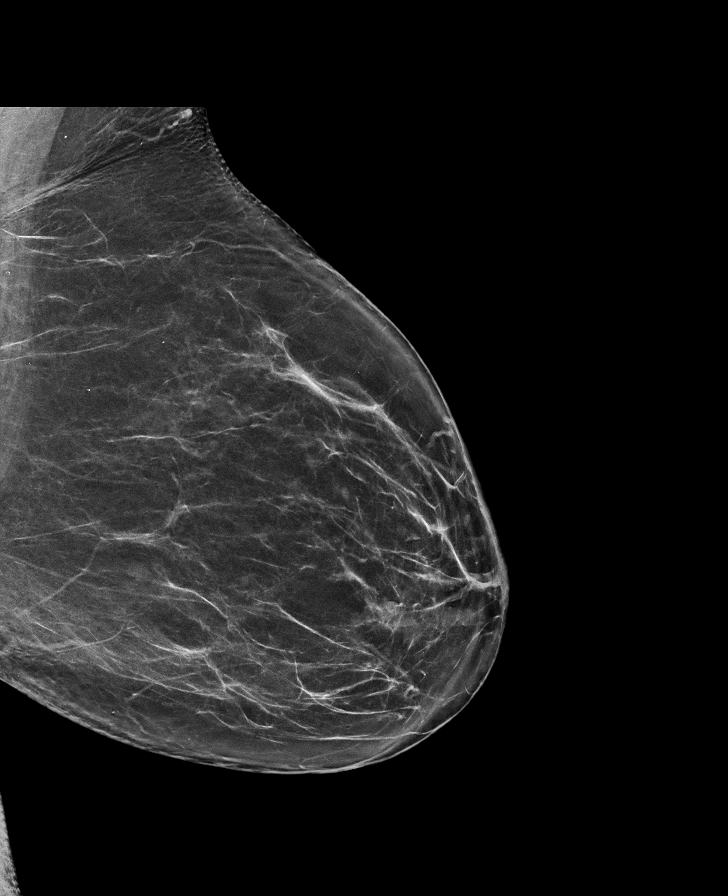

[L CC synth-2D]
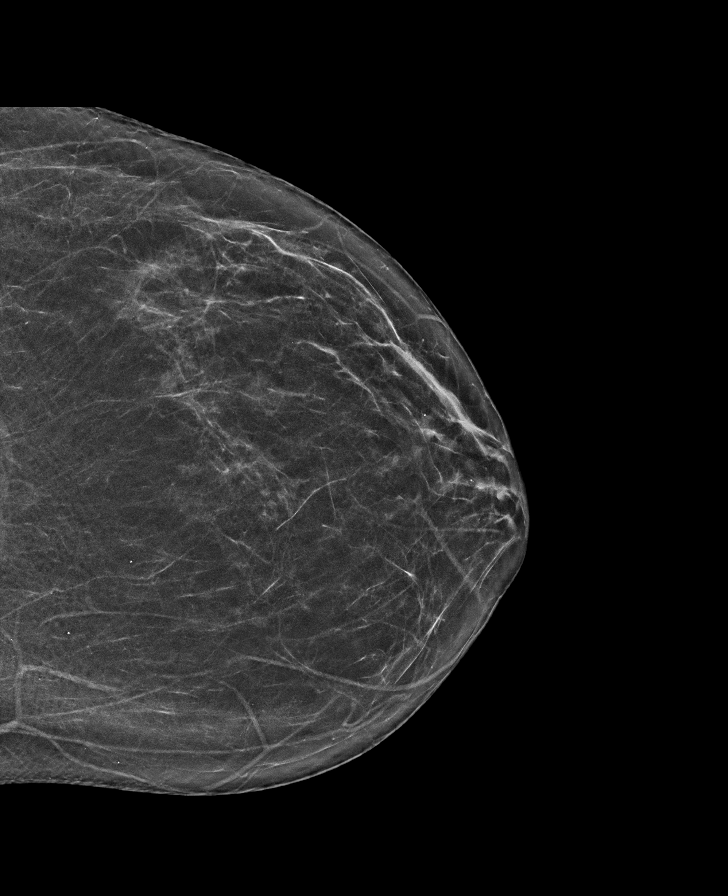

[R MLO synth-2D]
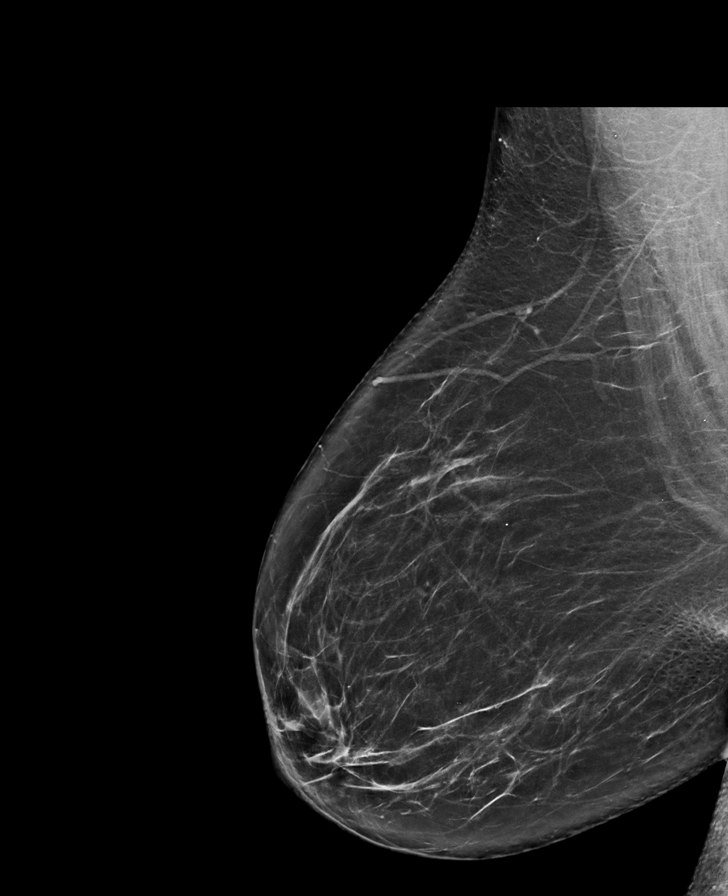

[R CC synth-2D]
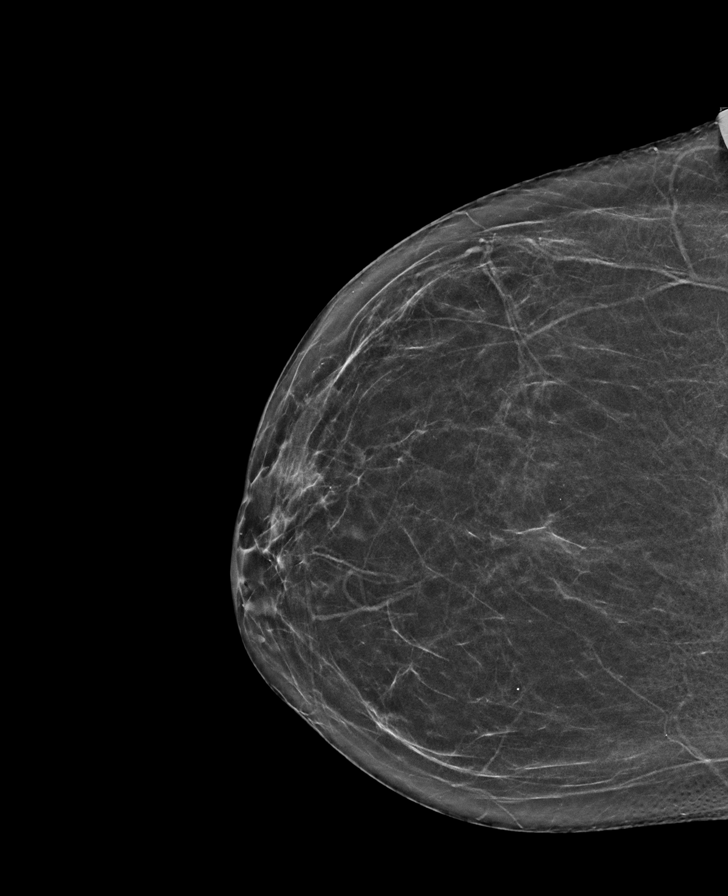

[L CC tomo · tomo slice 31/60.0]
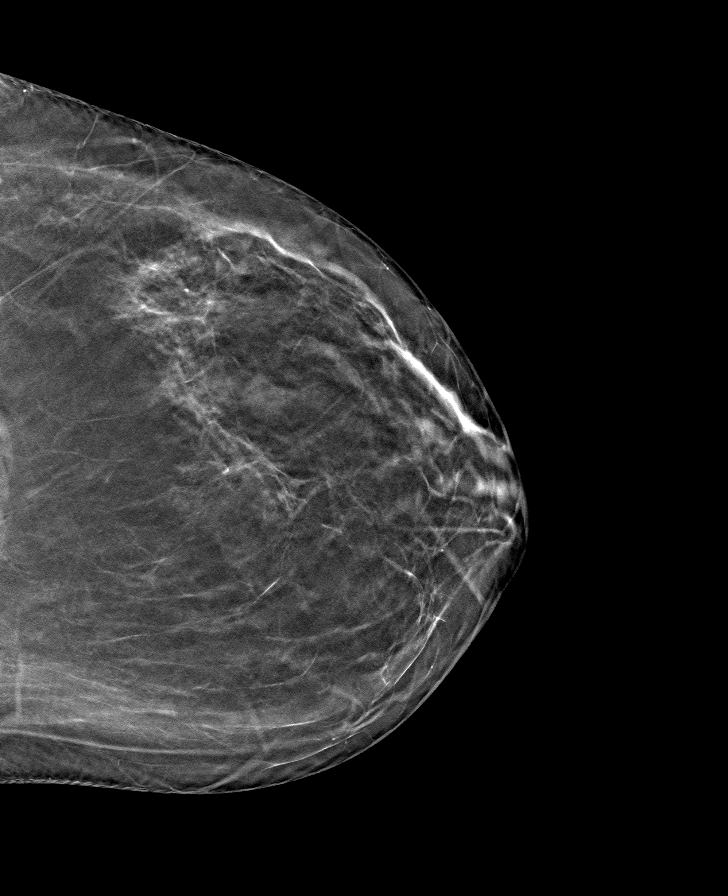

[L MLO tomo · tomo slice 37/73.0]
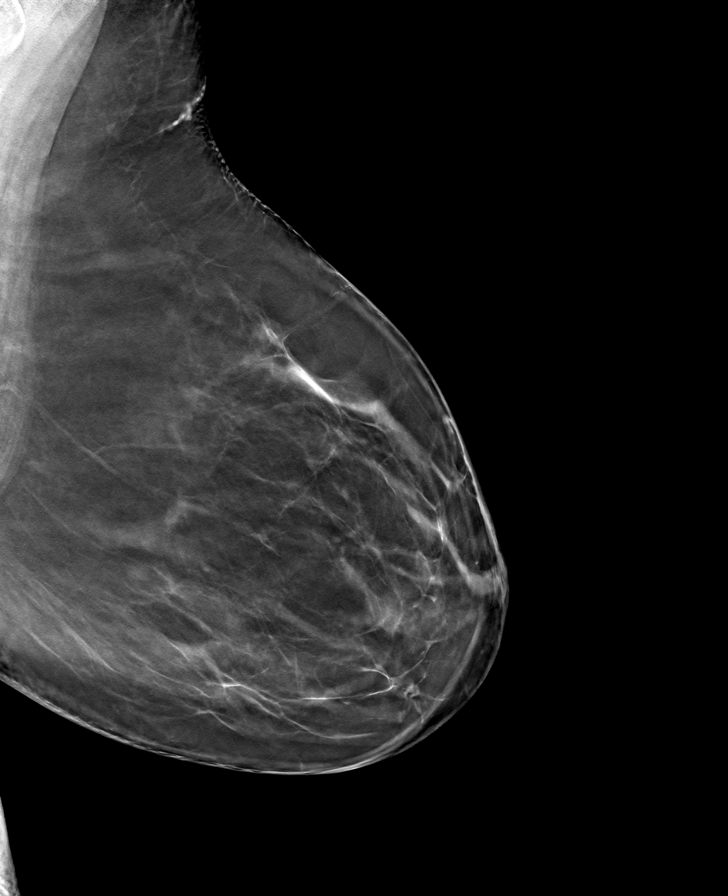

[R CC tomo · tomo slice 29/57.0]
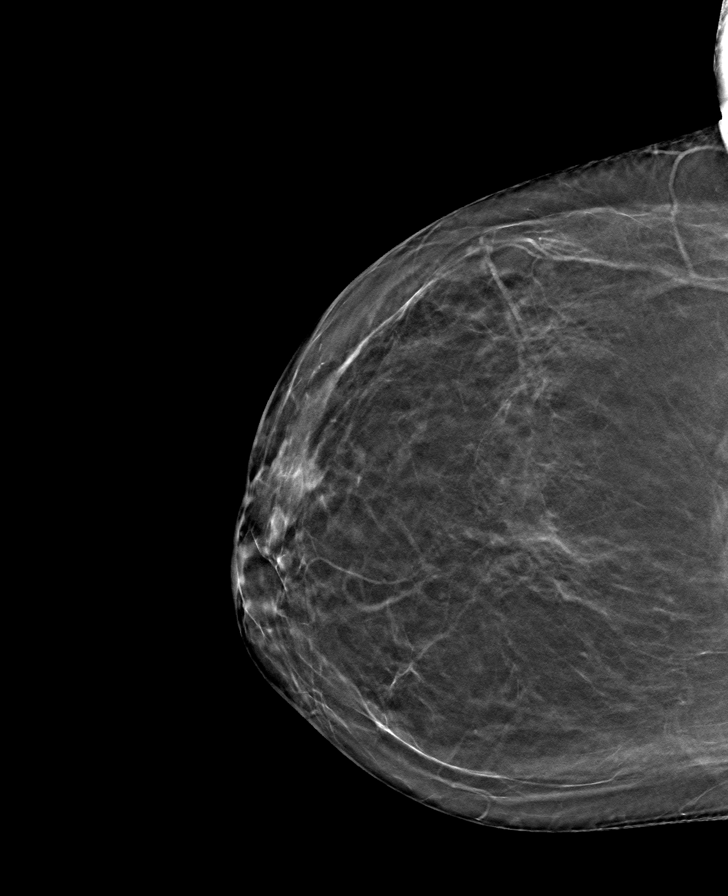

[R MLO tomo · tomo slice 39/78.0]
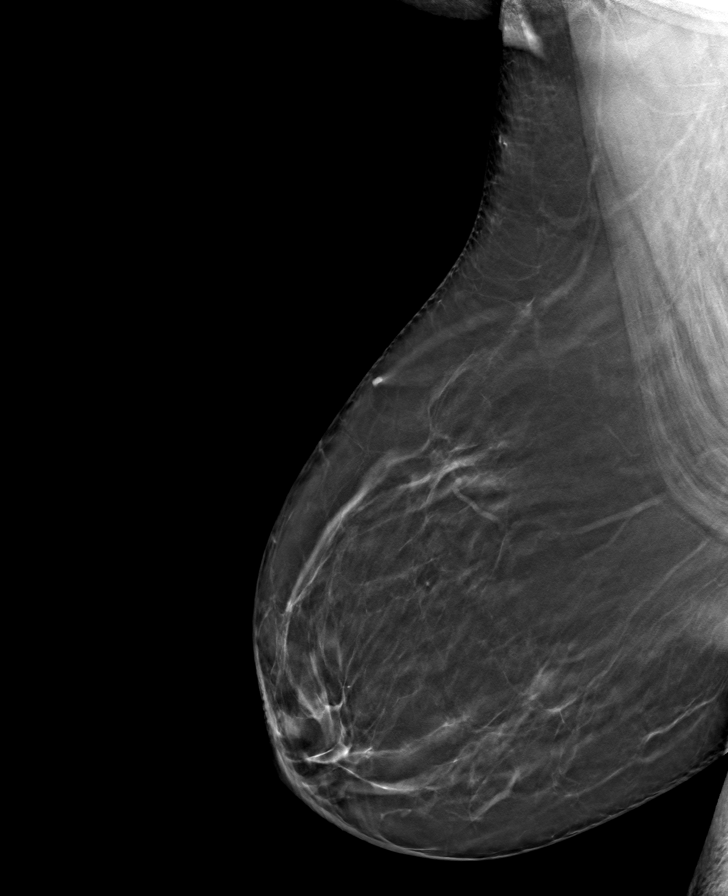

[8 of 24 positions shown; findings below may reference images not displayed]

ACR Breast Density Category b: There are scattered areas of
fibroglandular density.
FINDINGS: There are no findings suspicious for malignancy. Images were
processed with CAD.
IMPRESSION: No mammographic evidence of malignancy. A result letter of this
screening mammogram will be mailed directly to the patient.

RECOMMENDATION:
Screening mammogram in one year. (Code:[TQ])

BI-RADS CATEGORY  1: Negative.

## 2019-09-24 ENCOUNTER — Other Ambulatory Visit: Payer: Self-pay

## 2019-09-24 ENCOUNTER — Ambulatory Visit (INDEPENDENT_AMBULATORY_CARE_PROVIDER_SITE_OTHER): Payer: Medicaid Other | Admitting: Orthopedic Surgery

## 2019-09-24 DIAGNOSIS — M7501 Adhesive capsulitis of right shoulder: Secondary | ICD-10-CM

## 2019-09-26 DIAGNOSIS — M7501 Adhesive capsulitis of right shoulder: Secondary | ICD-10-CM | POA: Diagnosis not present

## 2019-09-26 MED ORDER — BUPIVACAINE HCL 0.5 % IJ SOLN
9.0000 mL | INTRAMUSCULAR | Status: AC | PRN
  Administered 2019-09-26: 9 mL via INTRA_ARTICULAR

## 2019-09-26 MED ORDER — METHYLPREDNISOLONE ACETATE 40 MG/ML IJ SUSP
40.0000 mg | INTRAMUSCULAR | Status: AC | PRN
  Administered 2019-09-26: 17:00:00 40 mg via INTRA_ARTICULAR

## 2019-09-26 MED ORDER — LIDOCAINE HCL 1 % IJ SOLN
5.0000 mL | INTRAMUSCULAR | Status: AC | PRN
  Administered 2019-09-26: 5 mL

## 2019-09-26 NOTE — Progress Notes (Signed)
Office Visit Note   Patient: Rhonda Higgins           Date of Birth: Nov 03, 1970           MRN: [id number] Visit Date: 09/24/2019 Requested by: [name], MD [address] [place],  [place] [address] PCP: [name], MD  Subjective: Chief Complaint  Patient presents with  . Right Shoulder - Pain    HPI: Rhonda Higgins is a 49 y.o. female who presents to the office complaining of right shoulder pain.  Patient is a right-hand-dominant individual who complains of right shoulder pain over the last 3 months.  She notes gradual onset of lateral right shoulder pain.  She denies any injury leading to onset of pain.  She has been using ice/Biofreeze/Mobic over the last 4 weeks with some relief of pain.  She denies any weakness of the shoulder, numbness, tingling, neck pain.  She saw her primary care physician who ordered x-rays that were negative for any identifiable pathology.  She does note a history of diabetes with her last A1c about 7.5 as well as a history of hypothyroidism.  She does note that she has been having difficulty with getting her bra on recently..                ROS:  All systems reviewed are negative as they relate to the chief complaint within the history of present illness.  Patient denies fevers or chills.  Assessment & Plan: Visit Diagnoses:  1. Adhesive capsulitis of right shoulder     Plan: Patient is a 49 year old female who presents complaining of gradual onset right shoulder pain over the last 3 months.  She does not have any weakness on exam.  She does have a small amount of crepitus of the right shoulder with passive range of motion but is not felt in the left shoulder.  Additionally she has decreased passive forward flexion and internal rotation compared with the contralateral side.  Impression is frozen shoulder versus rotator cuff pathology.  She is lacking some passive range of motion and does have some crepitus which may be indicative of a cuff  tear.  Administered a divided subacromial and glenohumeral right shoulder injection today.  She will follow-up in 6 weeks for clinical recheck.  Encouraged her to keep the shoulder moving.  We will decide for or against an MRI scan at that time.  Patient agreed with plan.  Follow-Up Instructions: No follow-ups on file.   Orders:  No orders of the defined types were placed in this encounter.  No orders of the defined types were placed in this encounter.     Procedures: Large Joint Inj: R subacromial bursa on 09/26/2019 4:45 PM Indications: diagnostic evaluation and pain Details: 18 G 1.5 in needle, posterior approach  Arthrogram: No  Medications: 9 mL bupivacaine 0.5 %; 40 mg methylPREDNISolone acetate 40 MG/ML; 5 mL lidocaine 1 % Outcome: tolerated well, no immediate complications Procedure, treatment alternatives, risks and benefits explained, specific risks discussed. Consent was given by the patient. Immediately prior to procedure a time out was called to verify the correct patient, procedure, equipment, support staff and site/side marked as required. Patient was prepped and draped in the usual sterile fashion.       Clinical Data: No additional findings.  Objective: Vital Signs: There were no vitals taken for this visit.  Physical Exam:  Constitutional: Patient appears well-developed HEENT:  Head: Normocephalic Eyes:EOM are normal Neck: Normal range of motion Cardiovascular:  Normal rate Pulmonary/chest: Effort normal Neurologic: Patient is alert Skin: Skin is warm Psychiatric: Patient has normal mood and affect  Ortho Exam:  Right shoulder Exam Equivalent abduction of the right shoulder compared with left shoulder.  Mild, subtle loss of passive forward flexion of the right shoulder compared to the contralateral side No loss of ER relative to the other shoulder.  Good endpoint with ER Moderate loss of passive internal rotation Small amount of crepitus felt with the  arm in 90 degrees of abduction while internally rotating/externally rotated right shoulder No TTP over the [facility] joint or bicipital groove Good subscapularis, supraspinatus, and infraspinatus strength Negative Hawkins impingement 5/5 grip strength, forearm pronation/supination, and bicep strength  Specialty Comments:  No specialty comments available.  Imaging: No results found.   PMFS History: Patient Active Problem List   Diagnosis Date Noted  . Cholelithiasis with chronic cholecystitis 09/06/2017  . Multinodular goiter (nontoxic) 11/17/2015  . Chest pain 07/20/2014  . Diabetes mellitus without complication ([place]) [id number]  . GERD (gastroesophageal reflux disease) 07/19/2014  . Personality disorder ([place])   . Uterine cancer ([place]) 06/22/2011  . DM 02/09/2010  . HLD (hyperlipidemia) 02/09/2010  . Obstructive sleep apnea 02/09/2010  . Allergic rhinitis 02/09/2010  . History of uterine cancer 02/09/2010   Past Medical History:  Diagnosis Date  . Adenocarcinoma (HCC)    endometrial, FIGO GRADE 1  . Allergic rhinitis   . Atypical chest pain    History of  . Depression   . Elevated liver enzymes   . GERD (gastroesophageal reflux disease)   . Hematuria   . History of endometrial cancer 08-02-2009   oncologist-  dr brewster/ Denman George and dr kinard/  no recurrence   endometrial adenocarinoma Stage 1B, Grade 1, FIGO--  s/p  TAH w/ BSO and pelvic lymph node dissection's and radiation therapy  . History of kidney stones   . History of radiation therapy    2011  pelvic intracavity brachytherapy treatment's for endometrial carcinoma  . History of thyroid nodule    multinodular goiter s/p  total thyroidectomy 11-19-2015  per pathology -  adenomatoid nodules  . Hyperlipidemia   . Hypothyroidism, postsurgical   . Insulin dependent diabetes mellitus    Type 2  . Left ureteral stone   . Obesity   . OSA (obstructive sleep apnea)    severe OSA  per study 03-08-2010--  noncomplant cpap  .  Overactive bladder   . Personality disorder ([place])   . Polyphagia(783.6)   . PONV (postoperative nausea and vomiting)    after ear surgery only one time  . Right lower quadrant pain   . Urgency of urination   . UTI (urinary tract infection)     Family History  Problem Relation Age of Onset  . Heart disease Sister   . Heart attack Brother   . Heart disease Brother   . Heart disease Sister   . Diabetes Sister   . Breast cancer Sister   . Asthma Mother   . Heart disease Mother   . Diabetes Mother   . Emphysema Mother   . Hypertension Mother   . Stroke Mother   . Prostate cancer Father     Past Surgical History:  Procedure Laterality Date  . CARDIOVASCULAR STRESS TEST  06/09/2008   normal nuclear study w/ no ischemia/  normal LV function and wall motion , ef 83%  . CHOLECYSTECTOMY N/A 09/06/2017   Procedure: LAPAROSCOPIC CHOLECYSTECTOMY WITH INTRAOPERATIVE CHOLANGIOGRAM;  Surgeon: Armandina Gemma, MD;  Location: [facility];  Service: General;  Laterality: N/A;  . CYSTOSCOPY/RETROGRADE/URETEROSCOPY/STONE EXTRACTION WITH BASKET Left 03/08/2016   Procedure: CYSTOSCOPY/RETROGRADE/URETEROSCOPY/STONE EXTRACTION WITH BASKET, STENT PLACEMENT;  Surgeon: Rana Snare, MD;  Location[facility];  Service: Urology;  Laterality: Left;  . ENDOMETRIAL BIOPSY    . ERCP N/A 09/07/2017   Procedure: ENDOSCOPIC RETROGRADE CHOLANGIOPANCREATOGRAPHY (ERCP);  Surgeon: [name], MD;  Location: [facility];  Service: Gastroenterology;  Laterality: N/A;  . HOLMIUM LASER APPLICATION Left [id number]   Procedure: HOLMIUM LASER APPLICATION;  Surgeon: Rana Snare, MD;  [facility];  Service: Urology;  Laterality: Left;  . MOUTH SURGERY    . MYRINGECOTMY W/ REMOVAL MIDDLE EAR CHOLESTEATOMA TYPE 1 FASICA TYMPANOPLASTY  09/13/2000  . ROBOTIC ASSISTED TOTAL HYSTERECTOMY WITH BILATERAL SALPINGO OOPHERECTOMY  08-02-2009   at [facility]  dr Denman George   w/  Bilateral pelvic and para aortic lymph  node dissection's  . THYROIDECTOMY N/A 11/19/2015   Procedure: TOTAL THYROIDECTOMY;  Surgeon: Armandina Gemma, MD;  Location: [facility];  Service: General;  Laterality: N/A;  . TONSILLECTOMY  age 35  . TRANSTHORACIC ECHOCARDIOGRAM  07/19/2014   ef 55-60%/  trivial TR  . TYMPANOPLASTY Right 1993   Social History   Occupational History  . Occupation: n/a  Tobacco Use  . Smoking status: Never Smoker  . Smokeless tobacco: Never Used  Substance and Sexual Activity  . Alcohol use: No  . Drug use: No  . Sexual activity: Yes    Birth control/protection: None

## 2019-10-09 ENCOUNTER — Ambulatory Visit: Payer: Medicaid Other | Admitting: Orthopedic Surgery

## 2019-11-05 ENCOUNTER — Other Ambulatory Visit: Payer: Self-pay

## 2019-11-05 ENCOUNTER — Ambulatory Visit (INDEPENDENT_AMBULATORY_CARE_PROVIDER_SITE_OTHER): Payer: Medicaid Other | Admitting: Orthopedic Surgery

## 2019-11-05 ENCOUNTER — Encounter: Payer: Self-pay | Admitting: Orthopedic Surgery

## 2019-11-05 VITALS — Ht 63.0 in | Wt 230.0 lb

## 2019-11-05 DIAGNOSIS — M7501 Adhesive capsulitis of right shoulder: Secondary | ICD-10-CM | POA: Diagnosis not present

## 2019-11-05 NOTE — Addendum Note (Signed)
Addended by: Meyer Cory on: 11/05/2019 03:36 PM   Modules accepted: Orders

## 2019-11-05 NOTE — Progress Notes (Signed)
Office Visit Note   Patient: Rhonda Higgins           Date of Birth: 06-30-1970           MRN: [id number] Visit Date: 11/05/2019 Requested by: [name], MD [address] [place],  [place] [address] PCP: [name], MD  Subjective: Chief Complaint  Patient presents with  . Right Shoulder - Pain  . Left Shoulder - Pain    HPI: Rhonda Higgins is a 49 year old patient with right shoulder pain.  Had a divided subacromial glenohumeral joint injection for 721.  The injection helped her for about 5 days.  Started having a little left shoulder pain without radiculopathy 3 days ago.  She has had months of symptoms on the right-hand side.  Does wake her from sleep at night.              ROS: All systems reviewed are negative as they relate to the chief complaint within the history of present illness.  Patient denies  fevers or chills.   Assessment & Plan: Visit Diagnoses:  1. Adhesive capsulitis of right shoulder     Plan: Impression is 5 days relief from right shoulder injection with pretty symmetric range of motion today.  Adhesive capsulitis little less likely on clinical examination today.  I think rotator cuff and/or labral pathology is possible.  She is having fairly annoying and debilitating pain which is failed conservative management including injection exercise as well as activity modification.  Plan at this time MRI arthrogram right shoulder evaluate labrum and rotator cuff tear follow-up after that study  Follow-Up Instructions: Return for after MRI.   Orders:  No orders of the defined types were placed in this encounter.  No orders of the defined types were placed in this encounter.     Procedures: No procedures performed   Clinical Data: No additional findings.  Objective: Vital Signs: Ht 5\' 3"  (1.6 m)   Wt 230 lb (104.3 kg)   BMI 40.74 kg/m   Physical Exam:   Constitutional: Patient appears well-developed HEENT:  Head: Normocephalic Eyes:EOM are  normal Neck: Normal range of motion Cardiovascular: Normal rate Pulmonary/chest: Effort normal Neurologic: Patient is alert Skin: Skin is warm Psychiatric: Patient has normal mood and affect    Ortho Exam: Ortho exam demonstrates full active and passive range of motion of the cervical spine.  Right shoulder has prior the 5 degrees less forward flexion on the right compared to the left.  External rotation strength and motion symmetric bilaterally with external rotation at 15 degrees of abduction to about 65 degrees bilaterally.  No coarse grinding with internal X rotation at 15 degrees of abduction but does have some on the right-hand side at 90 degrees of abduction.  Hard to say if that is bone or soft tissue type coarseness.  Motor or sensory function to the hand is intact  Specialty Comments:  No specialty comments available.  Imaging: No results found.   PMFS History: Patient Active Problem List   Diagnosis Date Noted  . Cholelithiasis with chronic cholecystitis 09/06/2017  . Multinodular goiter (nontoxic) 11/17/2015  . Chest pain 07/20/2014  . Diabetes mellitus without complication ([place]) [id number]  . GERD (gastroesophageal reflux disease) 07/19/2014  . Personality disorder ([place])   . Uterine cancer ([place]) 06/22/2011  . DM 02/09/2010  . HLD (hyperlipidemia) 02/09/2010  . Obstructive sleep apnea 02/09/2010  . Allergic rhinitis 02/09/2010  . History of uterine cancer 02/09/2010   Past Medical History:  Diagnosis  Date  . Adenocarcinoma ([place])    endometrial, FIGO GRADE 1  . Allergic rhinitis   . Atypical chest pain    History of  . Depression   . Elevated liver enzymes   . GERD (gastroesophageal reflux disease)   . Hematuria   . History of endometrial cancer 08-02-2009   oncologist-  dr brewster/ Denman George and dr kinard/  no recurrence   endometrial adenocarinoma Stage 1B, Grade 1, FIGO--  s/p  TAH w/ BSO and pelvic lymph node dissection's and radiation therapy  . History of  kidney stones   . History of radiation therapy    2011  pelvic intracavity brachytherapy treatment's for endometrial carcinoma  . History of thyroid nodule    multinodular goiter s/p  total thyroidectomy 11-19-2015  per pathology -  adenomatoid nodules  . Hyperlipidemia   . Hypothyroidism, postsurgical   . Insulin dependent diabetes mellitus    Type 2  . Left ureteral stone   . Obesity   . OSA (obstructive sleep apnea)    severe OSA  per study 03-08-2010--  noncomplant cpap  . Overactive bladder   . Personality disorder ([place])   . Polyphagia(783.6)   . PONV (postoperative nausea and vomiting)    after ear surgery only one time  . Right lower quadrant pain   . Urgency of urination   . UTI (urinary tract infection)     Family History  Problem Relation Age of Onset  . Heart disease Sister   . Heart attack Brother   . Heart disease Brother   . Heart disease Sister   . Diabetes Sister   . Breast cancer Sister   . Asthma Mother   . Heart disease Mother   . Diabetes Mother   . Emphysema Mother   . Hypertension Mother   . Stroke Mother   . Prostate cancer Father     Past Surgical History:  Procedure Laterality Date  . CARDIOVASCULAR STRESS TEST  06/09/2008   normal nuclear study w/ no ischemia/  normal LV function and wall motion , ef 83%  . CHOLECYSTECTOMY N/A 09/06/2017   Procedure: LAPAROSCOPIC CHOLECYSTECTOMY WITH INTRAOPERATIVE CHOLANGIOGRAM;  Surgeon: Armandina Gemma, MD;  Location: [facility];  Service: General;  Laterality: N/A;  . CYSTOSCOPY/RETROGRADE/URETEROSCOPY/STONE EXTRACTION WITH BASKET Left 03/08/2016   Procedure: CYSTOSCOPY/RETROGRADE/URETEROSCOPY/STONE EXTRACTION WITH BASKET, STENT PLACEMENT;  Surgeon: Rana Snare, MD;  Location: [facility];  Service: Urology;  Laterality: Left;  . ENDOMETRIAL BIOPSY    . ERCP N/A 09/07/2017   Procedure: ENDOSCOPIC RETROGRADE CHOLANGIOPANCREATOGRAPHY (ERCP);  Surgeon: [name], MD;  Location: [facility];  Service:  Gastroenterology;  Laterality: N/A;  . HOLMIUM LASER APPLICATION Left [id number]   Procedure: HOLMIUM LASER APPLICATION;  Surgeon: Rana Snare, MD;  Location: [facility];  Service: Urology;  Laterality: Left;  . MOUTH SURGERY    . MYRINGECOTMY W/ REMOVAL MIDDLE EAR CHOLESTEATOMA TYPE 1 FASICA TYMPANOPLASTY  09/13/2000  . ROBOTIC ASSISTED TOTAL HYSTERECTOMY WITH BILATERAL SALPINGO OOPHERECTOMY  08-02-2009   at [facility]  dr Denman George   w/  Bilateral pelvic and para aortic lymph node dissection's  . THYROIDECTOMY N/A 11/19/2015   Procedure: TOTAL THYROIDECTOMY;  Surgeon: Armandina Gemma, MD;  Location: [facility];  Service: General;  Laterality: N/A;  . TONSILLECTOMY  age 77  . TRANSTHORACIC ECHOCARDIOGRAM  07/19/2014   ef 55-60%/  trivial TR  . TYMPANOPLASTY Right 1993   Social History   Occupational History  . Occupation: n/a  Tobacco Use  .  Smoking status: Never Smoker  . Smokeless tobacco: Never Used  Substance and Sexual Activity  . Alcohol use: No  . Drug use: No  . Sexual activity: Yes    Birth control/protection: None

## 2019-11-26 ENCOUNTER — Ambulatory Visit
Admission: RE | Admit: 2019-11-26 | Discharge: 2019-11-26 | Disposition: A | Discharge status: Home / Self Care | Payer: Medicaid Other | Source: Ambulatory Visit | Attending: Orthopedic Surgery | Admitting: Orthopedic Surgery

## 2019-11-26 ENCOUNTER — Other Ambulatory Visit: Payer: Self-pay

## 2019-11-26 DIAGNOSIS — M7501 Adhesive capsulitis of right shoulder: Secondary | ICD-10-CM

## 2019-11-26 IMAGING — MR MR SHOULDER*R* W/CM
6 series · 40 of 40 positions shown · IV contrast (agent unspecified)
Comparison: None.

CLINICAL DATA: Right shoulder pain for 8 months.  No known injury.

EXAM:
MR ARTHROGRAM OF THE RIGHT SHOULDER
TECHNIQUE: Multiplanar, multisequence MR imaging of the right shoulder was
performed following the administration of intra-articular contrast.
CONTRAST:  See Injection Documentation.

[Series 3: T1 fat-sat · axial · 4.0mm · 0.27mm/px · z∈[-30,+61]mm · 9 of 20 slices shown (1 of 4)]
[im 1/20]
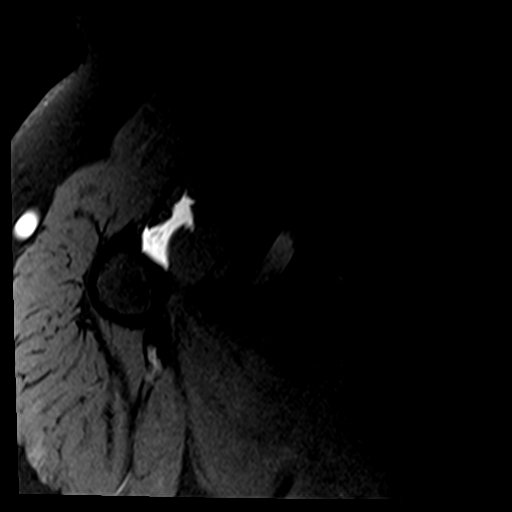
[im 3/20]
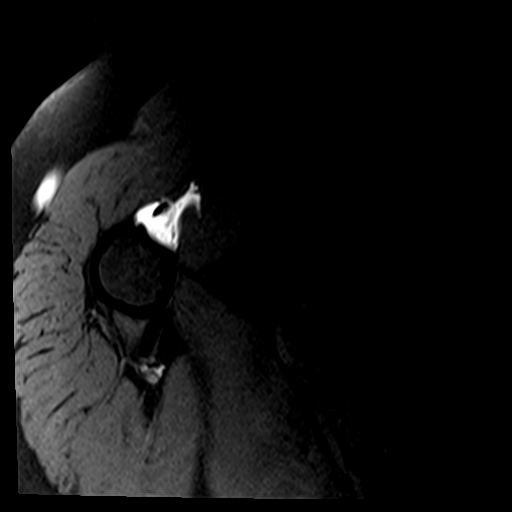
[im 5/20]
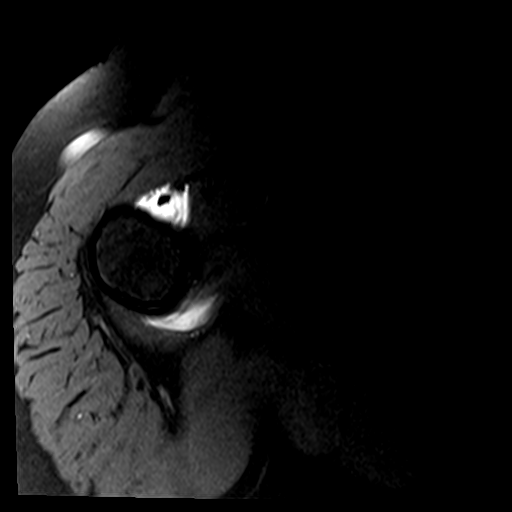
[im 8/20]
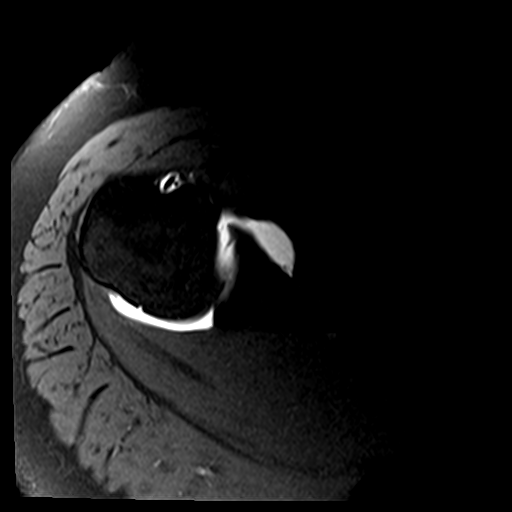
[im 10/20]
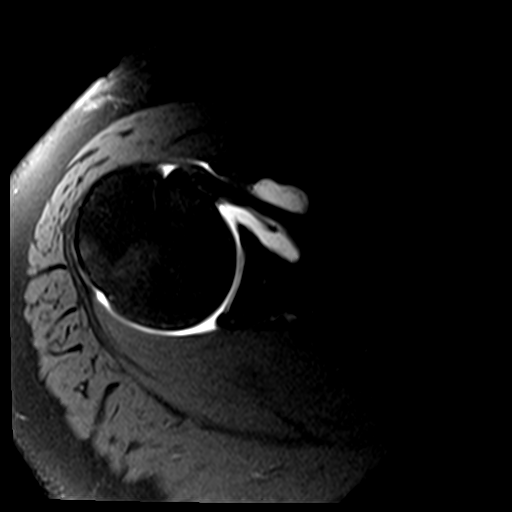
[im 12/20]
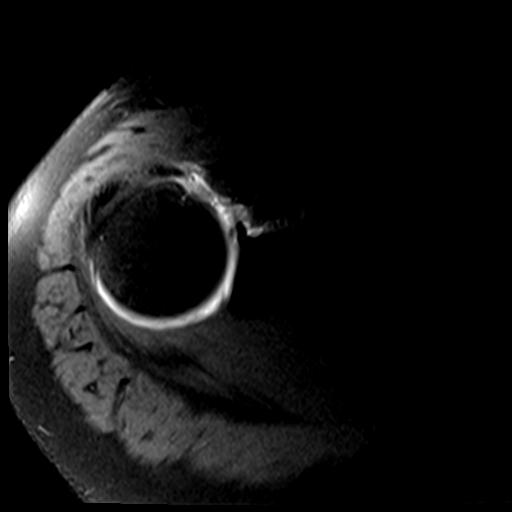
[im 15/20]
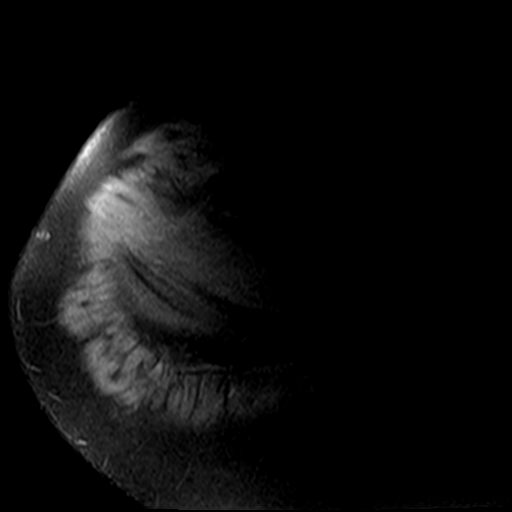
[im 17/20]
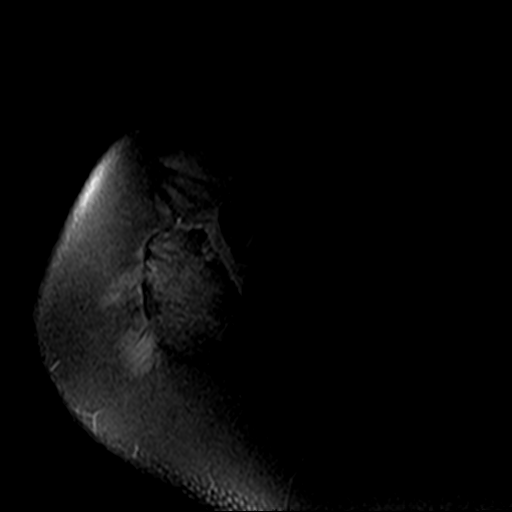
[im 20/20]
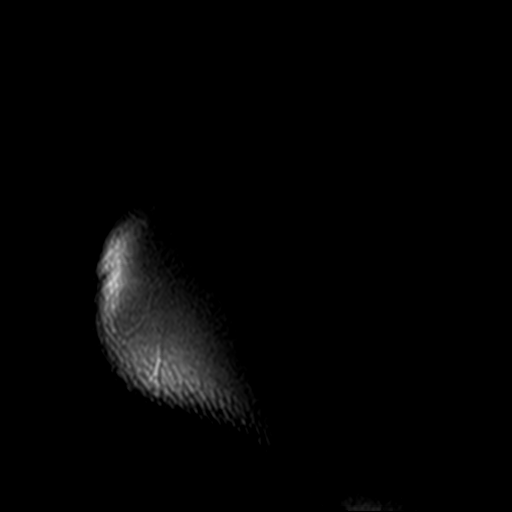

[Series 4: T2 fat-sat · coronal · 4.0mm · 0.55mm/px · 7 of 19 slices shown (1 of 2)]
[im 1/19]
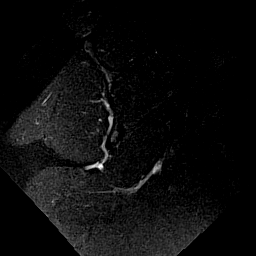
[im 4/19]
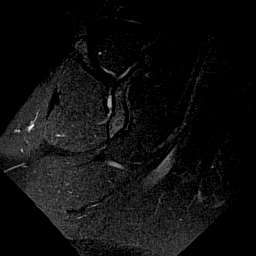
[im 7/19]
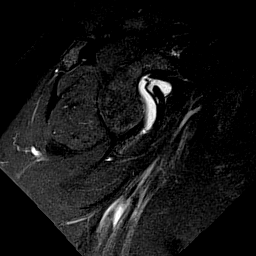
[im 10/19]
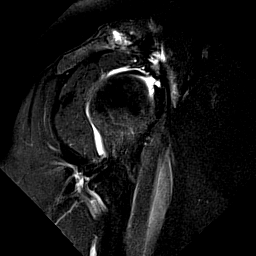
[im 13/19]
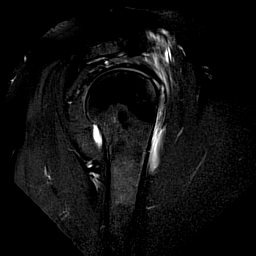
[im 16/19]
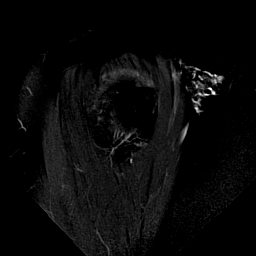
[im 19/19]
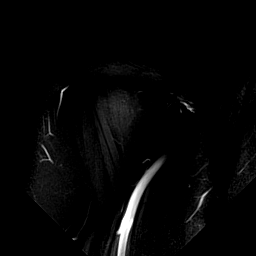

[Series 5: T1 fat-sat · oblique · 4.0mm · 0.55mm/px · 6 of 16 slices shown (2 of 4)]
[im 1/16]
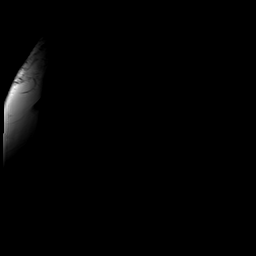
[im 4/16]
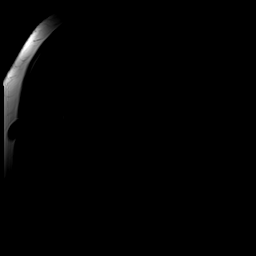
[im 7/16]
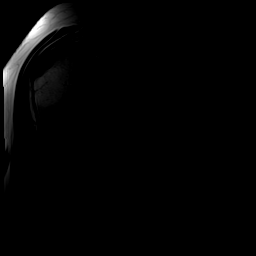
[im 10/16]
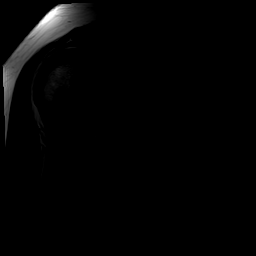
[im 13/16]
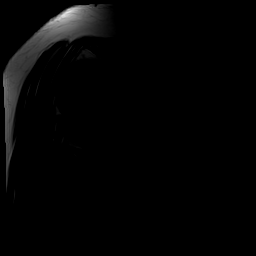
[im 16/16]
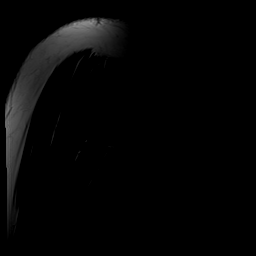

[Series 6: T1 fat-sat · oblique · 4.0mm · 0.55mm/px · 6 of 16 slices shown (3 of 4)]
[im 1/16]
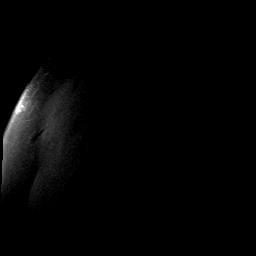
[im 4/16]
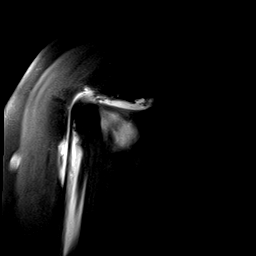
[im 7/16]
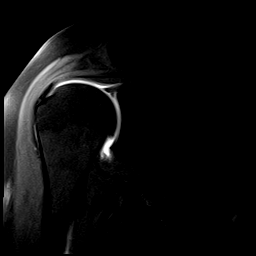
[im 10/16]
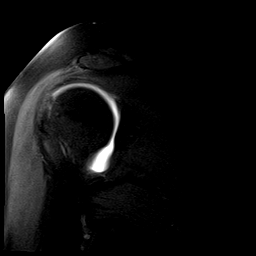
[im 13/16]
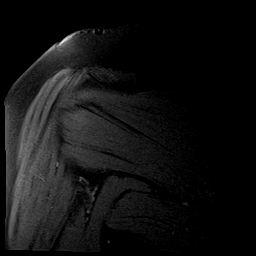
[im 16/16]
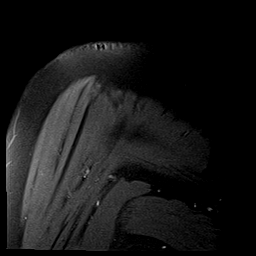

[Series 7: T2 fat-sat · oblique · 4.0mm · 0.55mm/px · 6 of 16 slices shown (2 of 2)]
[im 1/16]
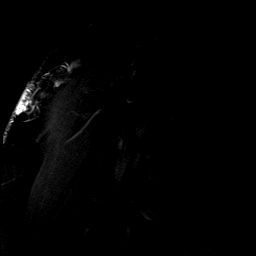
[im 4/16]
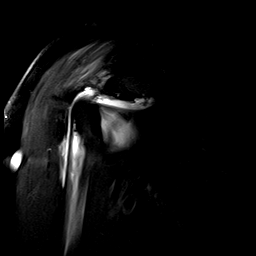
[im 7/16]
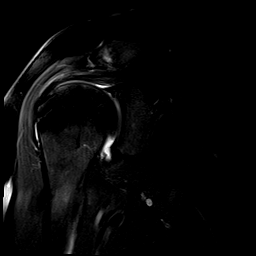
[im 10/16]
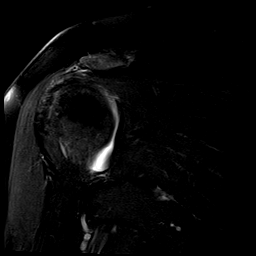
[im 13/16]
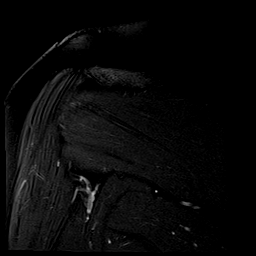
[im 16/16]
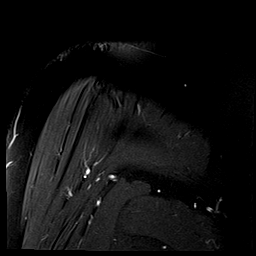

[Series 10: T1 fat-sat · sagittal · 4.0mm · 0.59mm/px · 6 of 16 slices shown (4 of 4)]
[im 1/16]
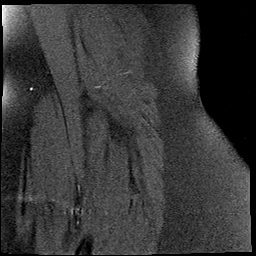
[im 4/16]
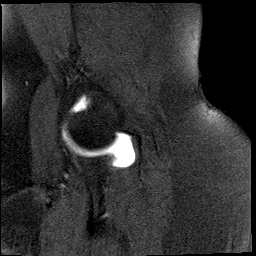
[im 7/16]
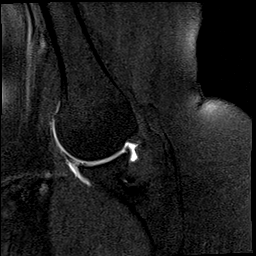
[im 10/16]
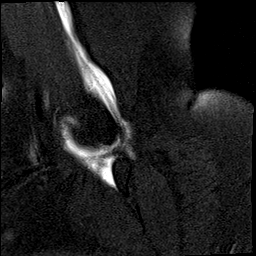
[im 13/16]
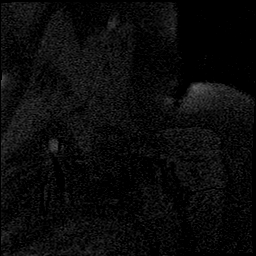
[im 16/16]
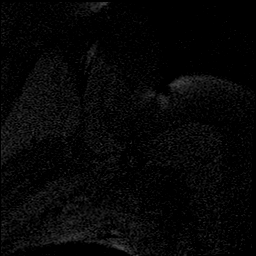

[40 of 40 positions shown; findings below may reference images not displayed]

FINDINGS: Rotator cuff: Intact. Mild-to-moderate supraspinatus and
infraspinatus tendinopathy appears somewhat worse in the
supraspinatus.

Muscles: No atrophy or focal lesion.

Biceps long head: Intact.

Acromioclavicular Joint: Moderate osteoarthritis. The acromion is
type 1. There is some fluid but no contrast in the
subacromial/subdeltoid bursa.

Glenohumeral Joint: Appears normal.

Labrum: Intact.

Bones: No fracture, contusion or focal lesion.
IMPRESSION: Supraspinatus and infraspinatus tendinopathy without tear.

Moderate acromioclavicular osteoarthritis.

Small volume of subacromial/subdeltoid fluid compatible with
bursitis.

## 2019-11-26 IMAGING — XA DG FLUORO GUIDE NDL PLC/BX
1 series · 1 of 1 positions shown · non-contrast
Comparison: none

CLINICAL DATA: Chronic right shoulder pain.

[Series 1: ortho adipose · 1 of 1 slices shown]
[im 1/1]
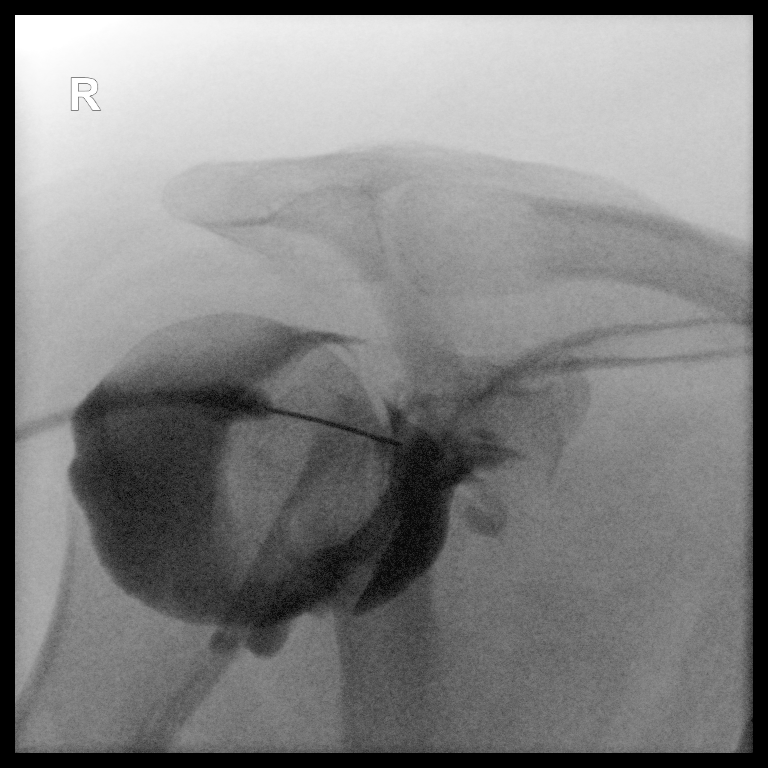

[1 of 1 positions shown; findings below may reference images not displayed]

FLUOROSCOPY TIME:  Radiation Exposure Index (as provided by the
fluoroscopic device): 0.5 mGy

Fluoroscopy Time:  8 seconds

Number of Acquired Images:  0

PROCEDURE:
The risks and benefits of the procedure were discussed with the
patient, and written informed consent was obtained. The patient
stated no history of allergy to contrast media. A formal timeout
procedure was performed with the patient according to departmental
protocol.

The patient was placed supine on the fluoroscopy table and the right
glenohumeral joint was identified under fluoroscopy. The skin
overlying the right glenohumeral joint was subsequently cleaned with
Betadine and a sterile drape was placed over the area of interest. 2
ml 1% Lidocaine was used to anesthetize the skin around the needle
insertion site.

A 22 gauge spinal needle was inserted into the right glenohumeral
joint under fluoroscopy.

12 ml of gadolinium mixture (0.1 ml of Multihance mixed with 10 ml
of Isovue-M 200 contrast and 10 ml of sterile saline) were injected
into the right glenohumeral joint.

The needle was removed and hemostasis was achieved. The patient was
subsequently transferred to MRI for imaging.
IMPRESSION: Technically successful right shoulder injection for MRI.

## 2019-11-26 MED ORDER — IOPAMIDOL (ISOVUE-M 200) INJECTION 41%
12.0000 mL | Freq: Once | INTRAMUSCULAR | Status: AC
  Administered 2019-11-26: 12 mL via INTRA_ARTICULAR

## 2019-12-01 ENCOUNTER — Encounter: Payer: Self-pay | Admitting: Orthopedic Surgery

## 2019-12-01 ENCOUNTER — Ambulatory Visit (INDEPENDENT_AMBULATORY_CARE_PROVIDER_SITE_OTHER): Payer: Medicaid Other | Admitting: Orthopedic Surgery

## 2019-12-01 DIAGNOSIS — M19011 Primary osteoarthritis, right shoulder: Secondary | ICD-10-CM | POA: Diagnosis not present

## 2019-12-01 DIAGNOSIS — M19019 Primary osteoarthritis, unspecified shoulder: Secondary | ICD-10-CM

## 2019-12-05 ENCOUNTER — Ambulatory Visit: Payer: Medicaid Other | Admitting: Podiatry

## 2019-12-07 ENCOUNTER — Encounter: Payer: Self-pay | Admitting: Orthopedic Surgery

## 2019-12-07 NOTE — Progress Notes (Signed)
Office Visit Note   Patient: Rhonda Higgins           Date of Birth: 04-Jan-1971           MRN: [id number] Visit Date: 12/01/2019 Requested by: [name], MD [address] [place],  [place] [address] PCP: [name], MD  Subjective: Chief Complaint  Patient presents with  . Follow-up    HPI: Rhonda Higgins is a 49 y.o. female who presents to the office complaining of right shoulder pain.  She presents for MRI review of the right shoulder.  MRI revealed supraspinatus and infraspinatus tendinopathy without tear as well as moderate AC joint osteoarthritis.  She notes that the last injection she received in her shoulder lasted about 5 days and gave her 50% relief.  She has had no prior surgery on the right shoulder.  She would like to try another injection..                ROS:  All systems reviewed are negative as they relate to the chief complaint within the history of present illness.  Patient denies fevers or chills.  Assessment & Plan: Visit Diagnoses:  1. Osteoarthritis of AC (acromioclavicular) joint     Plan: Patient is a 49 year old female who presents complaint of right shoulder pain.  She has moderate AC joint osteoarthritis found on MRI scan as well as infra/supra tendinopathy without tear.  She has no significant weakness but she does have pain that is localized to the [facility] joint primarily and worse with abduction and crossover adduction.  Plan is AC joint injection today under ultrasound guidance.  Patient tolerated the procedure well.  Follow-up as needed.  Follow-Up Instructions: No follow-ups on file.   Orders:  No orders of the defined types were placed in this encounter.  No orders of the defined types were placed in this encounter.     Procedures: Medium Joint Inj: R acromioclavicular on 12/09/2019 4:28 PM Indications: pain and diagnostic evaluation Details: 27 G 1.5 in needle, ultrasound-guided superior approach Medications: 13.33 mg  methylPREDNISolone acetate 40 MG/ML; 0.66 mL bupivacaine 0.25 %; 3 mL lidocaine 1 % Outcome: tolerated well, no immediate complications Procedure, treatment alternatives, risks and benefits explained, specific risks discussed. Consent was given by the patient. Immediately prior to procedure a time out was called to verify the correct patient, procedure, equipment, support staff and site/side marked as required. Patient was prepped and draped in the usual sterile fashion.       Clinical Data: No additional findings.  Objective: Vital Signs: There were no vitals taken for this visit.  Physical Exam:  Constitutional: Patient appears well-developed HEENT:  Head: Normocephalic Eyes:EOM are normal Neck: Normal range of motion Cardiovascular: Normal rate Pulmonary/chest: Effort normal Neurologic: Patient is alert Skin: Skin is warm Psychiatric: Patient has normal mood and affect  Ortho Exam:  Right shoulder Exam Able to fully forward flex and abduct shoulder overhead No loss of ER relative to the other shoulder.  Good endpoint with ER Moderate tenderness palpation over the [facility] joint.  Pain is worse at this location with abduction and crossarm adduction Good subscapularis, supraspinatus, and infraspinatus strength 5/5 grip strength, forearm pronation/supination, and bicep strength  Specialty Comments:  No specialty comments available.  Imaging: No results found.   PMFS History: Patient Active Problem List   Diagnosis Date Noted  . Cholelithiasis with chronic cholecystitis 09/06/2017  . Multinodular goiter (nontoxic) 11/17/2015  . Chest pain 07/20/2014  . Diabetes mellitus without  complication ([place]) 21/19/4174  . GERD (gastroesophageal reflux disease) 07/19/2014  . Personality disorder ([place])   . Uterine cancer ([place]) 06/22/2011  . DM 02/09/2010  . HLD (hyperlipidemia) 02/09/2010  . Obstructive sleep apnea 02/09/2010  . Allergic rhinitis 02/09/2010  . History of uterine  cancer 02/09/2010   Past Medical History:  Diagnosis Date  . Adenocarcinoma (HCC)    endometrial, FIGO GRADE 1  . Allergic rhinitis   . Atypical chest pain    History of  . Depression   . Elevated liver enzymes   . GERD (gastroesophageal reflux disease)   . Hematuria   . History of endometrial cancer 08-02-2009   oncologist-  dr brewster/ Denman George and dr kinard/  no recurrence   endometrial adenocarinoma Stage 1B, Grade 1, FIGO--  s/p  TAH w/ BSO and pelvic lymph node dissection's and radiation therapy  . History of kidney stones   . History of radiation therapy    2011  pelvic intracavity brachytherapy treatment's for endometrial carcinoma  . History of thyroid nodule    multinodular goiter s/p  total thyroidectomy 11-19-2015  per pathology -  adenomatoid nodules  . Hyperlipidemia   . Hypothyroidism, postsurgical   . Insulin dependent diabetes mellitus    Type 2  . Left ureteral stone   . Obesity   . OSA (obstructive sleep apnea)    severe OSA  per study 03-08-2010--  noncomplant cpap  . Overactive bladder   . Personality disorder ([place])   . Polyphagia(783.6)   . PONV (postoperative nausea and vomiting)    after ear surgery only one time  . Right lower quadrant pain   . Urgency of urination   . UTI (urinary tract infection)     Family History  Problem Relation Age of Onset  . Heart disease Sister   . Heart attack Brother   . Heart disease Brother   . Heart disease Sister   . Diabetes Sister   . Breast cancer Sister   . Asthma Mother   . Heart disease Mother   . Diabetes Mother   . Emphysema Mother   . Hypertension Mother   . Stroke Mother   . Prostate cancer Father     Past Surgical History:  Procedure Laterality Date  . CARDIOVASCULAR STRESS TEST  06/09/2008   normal nuclear study w/ no ischemia/  normal LV function and wall motion , ef 83%  . CHOLECYSTECTOMY N/A 09/06/2017   Procedure: LAPAROSCOPIC CHOLECYSTECTOMY WITH INTRAOPERATIVE CHOLANGIOGRAM;  Surgeon:  Armandina Gemma, MD;  Location: [facility];  Service: General;  Laterality: N/A;  . CYSTOSCOPY/RETROGRADE/URETEROSCOPY/STONE EXTRACTION WITH BASKET Left 03/08/2016   Procedure: CYSTOSCOPY/RETROGRADE/URETEROSCOPY/STONE EXTRACTION WITH BASKET, STENT PLACEMENT;  Surgeon: Rana Snare, MD;  [facility];  Service: Urology;  Laterality: Left;  . ENDOMETRIAL BIOPSY    . ERCP N/A 09/07/2017   Procedure: ENDOSCOPIC RETROGRADE CHOLANGIOPANCREATOGRAPHY (ERCP);  Surgeon: [name], MD;  Location: [facility];  Service: Gastroenterology;  Laterality: N/A;  . HOLMIUM LASER APPLICATION Left 0/81/4481   Procedure: HOLMIUM LASER APPLICATION;  Surgeon: Rana Snare, MD;  Location: [facility];  Service: Urology;  Laterality: Left;  . MOUTH SURGERY    . MYRINGECOTMY W/ REMOVAL MIDDLE EAR CHOLESTEATOMA TYPE 1 FASICA TYMPANOPLASTY  09/13/2000  . ROBOTIC ASSISTED TOTAL HYSTERECTOMY WITH BILATERAL SALPINGO OOPHERECTOMY  08-02-2009   at [facility]  dr Denman George   w/  Bilateral pelvic and para aortic lymph node dissection's  . THYROIDECTOMY N/A 11/19/2015   Procedure: TOTAL THYROIDECTOMY;  Surgeon: Armandina Gemma, MD;  Location: [facility];  Service: General;  Laterality: N/A;  . TONSILLECTOMY  age 89  . TRANSTHORACIC ECHOCARDIOGRAM  07/19/2014   ef 55-60%/  trivial TR  . TYMPANOPLASTY Right 1993   Social History   Occupational History  . Occupation: n/a  Tobacco Use  . Smoking status: Never Smoker  . Smokeless tobacco: Never Used  Vaping Use  . Vaping Use: Never used  Substance and Sexual Activity  . Alcohol use: No  . Drug use: No  . Sexual activity: Yes    Birth control/protection: None

## 2019-12-09 DIAGNOSIS — M19011 Primary osteoarthritis, right shoulder: Secondary | ICD-10-CM

## 2019-12-09 MED ORDER — LIDOCAINE HCL 1 % IJ SOLN
3.0000 mL | INTRAMUSCULAR | Status: AC | PRN
  Administered 2019-12-09: 3 mL

## 2019-12-09 MED ORDER — METHYLPREDNISOLONE ACETATE 40 MG/ML IJ SUSP
13.3300 mg | INTRAMUSCULAR | Status: AC | PRN
  Administered 2019-12-09: 13.33 mg via INTRA_ARTICULAR

## 2019-12-09 MED ORDER — BUPIVACAINE HCL 0.25 % IJ SOLN
0.6600 mL | INTRAMUSCULAR | Status: AC | PRN
  Administered 2019-12-09: .66 mL via INTRA_ARTICULAR

## 2019-12-31 ENCOUNTER — Other Ambulatory Visit: Payer: Self-pay | Admitting: Gastroenterology

## 2019-12-31 DIAGNOSIS — K745 Biliary cirrhosis, unspecified: Secondary | ICD-10-CM

## 2020-01-01 ENCOUNTER — Other Ambulatory Visit: Payer: Medicaid Other

## 2020-01-05 ENCOUNTER — Ambulatory Visit
Admission: RE | Admit: 2020-01-05 | Discharge: 2020-01-05 | Disposition: A | Discharge status: Home / Self Care | Payer: Medicaid Other | Source: Ambulatory Visit | Attending: Gastroenterology | Admitting: Gastroenterology

## 2020-01-05 DIAGNOSIS — K745 Biliary cirrhosis, unspecified: Secondary | ICD-10-CM

## 2020-01-05 IMAGING — US US ABDOMEN LIMITED
1 series · 14 of 25 positions shown · non-contrast
Comparison: [DATE]

CLINICAL DATA: Biliary cirrhosis. History of prior cholecystectomy.

EXAM:
ULTRASOUND ABDOMEN LIMITED RIGHT UPPER QUADRANT

[Series 1: us abdomen limited · 0.33mm/px · 14 of 30 slices shown]
[im 1/30]
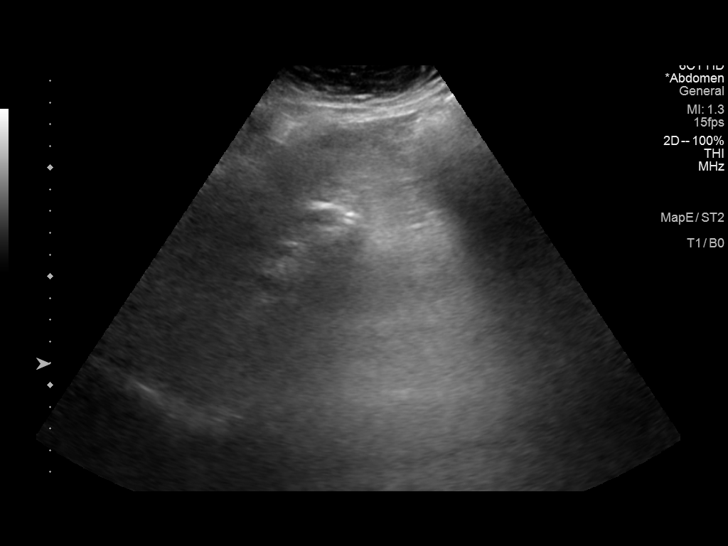
[im 3/30]
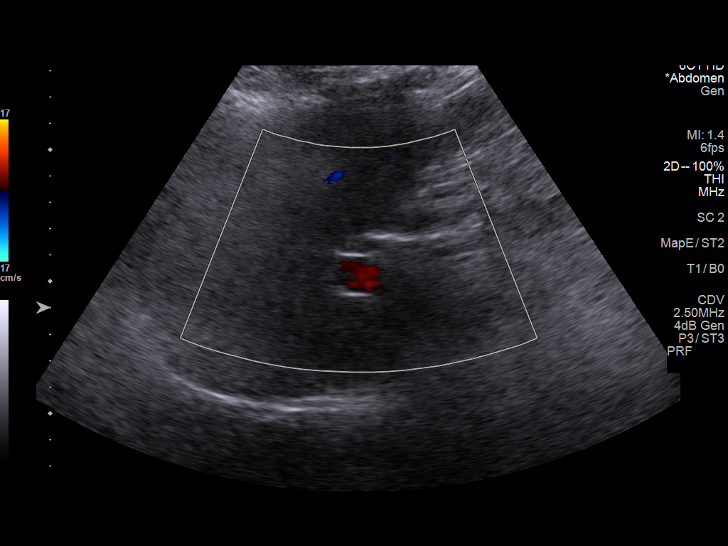
[im 5/30]
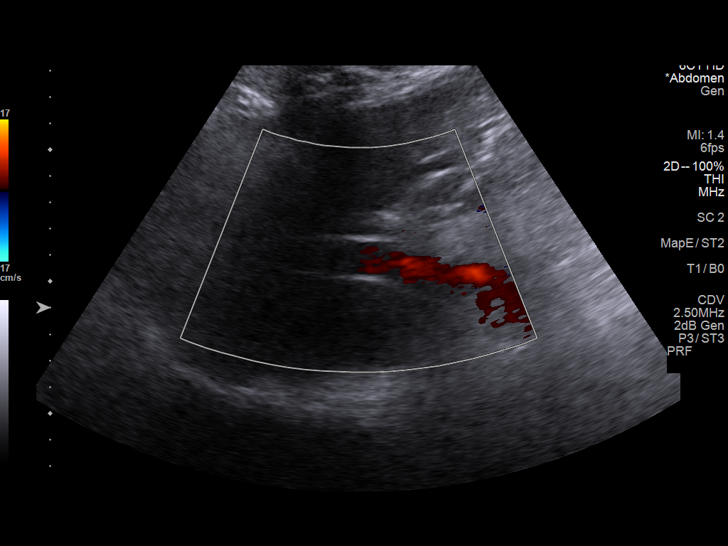
[im 8/30]
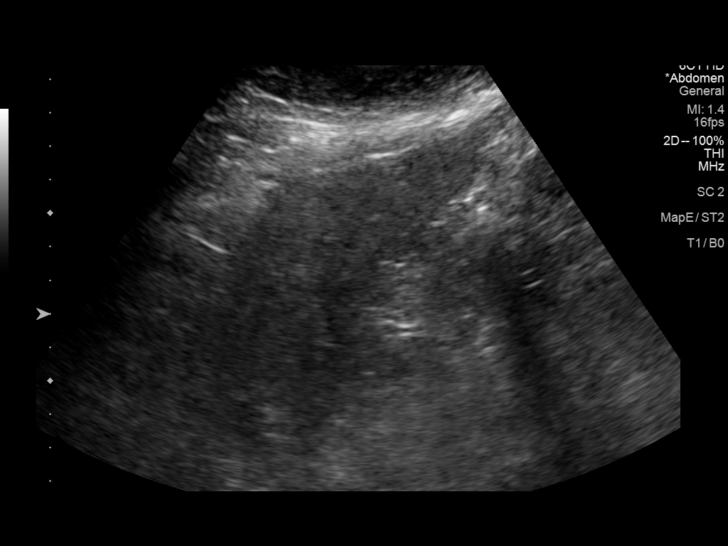
[im 10/30]
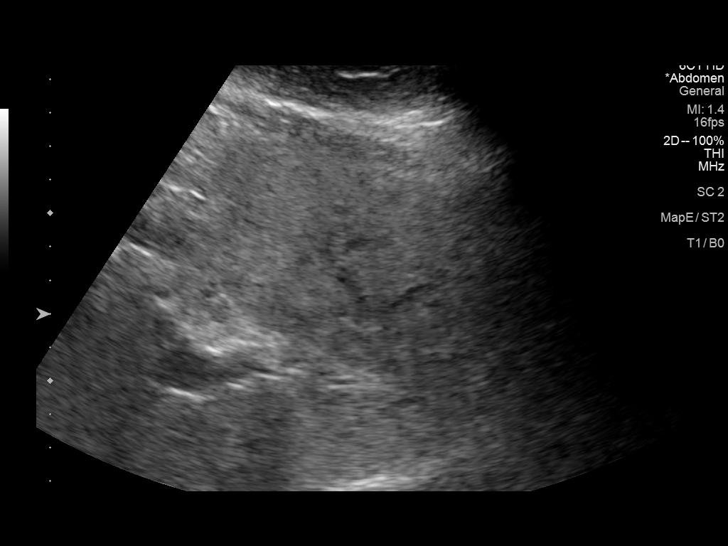
[im 11/30]
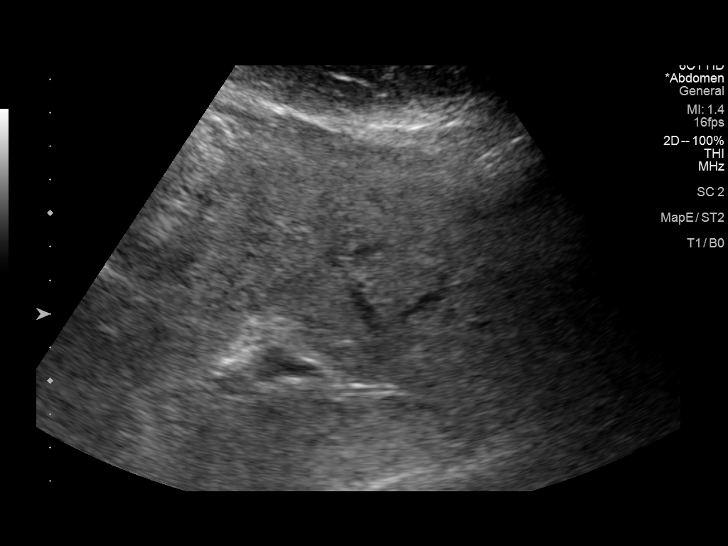
[im 14/30]
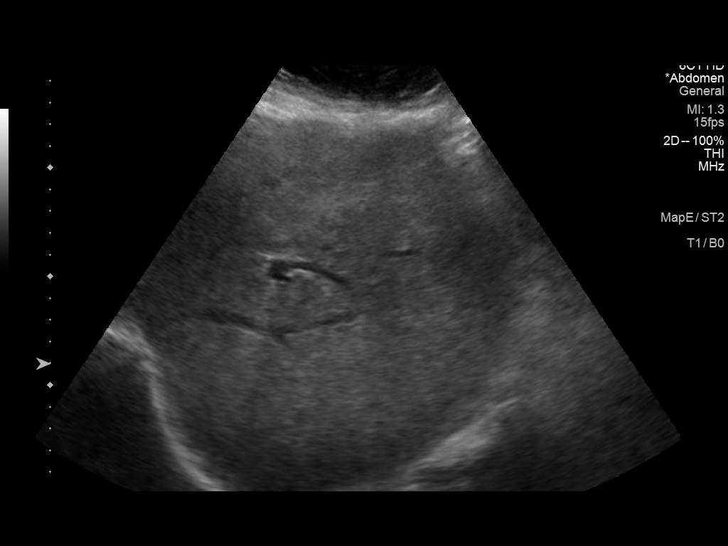
[im 16/30]
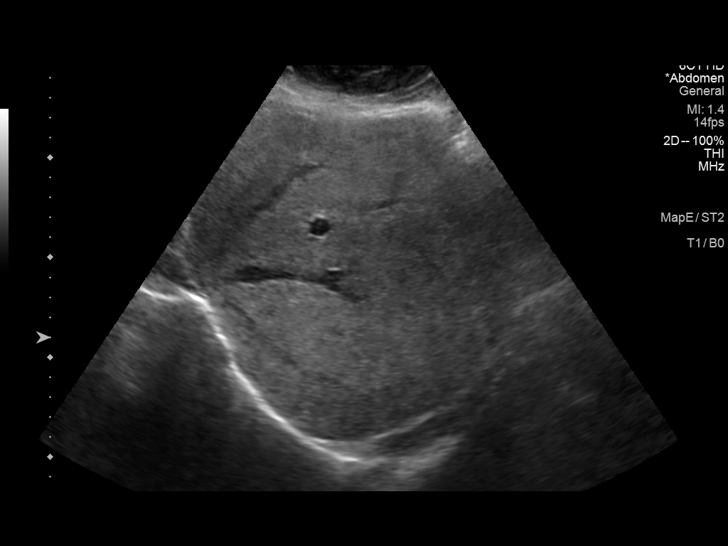
[im 19/30]
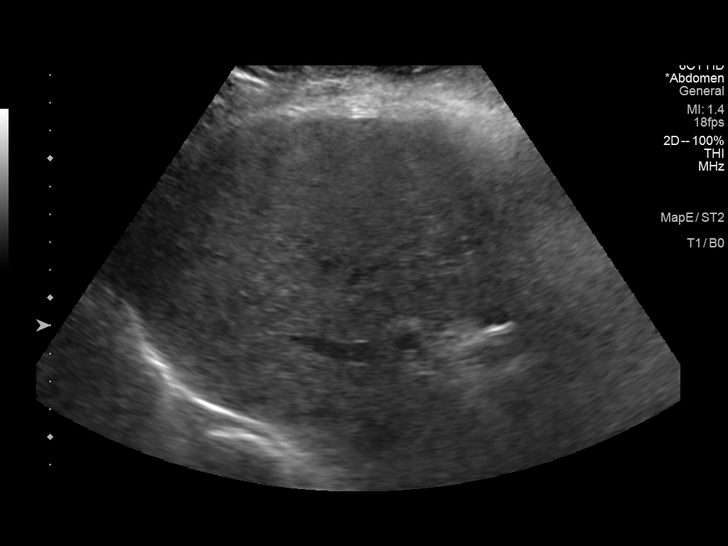
[im 20/30]
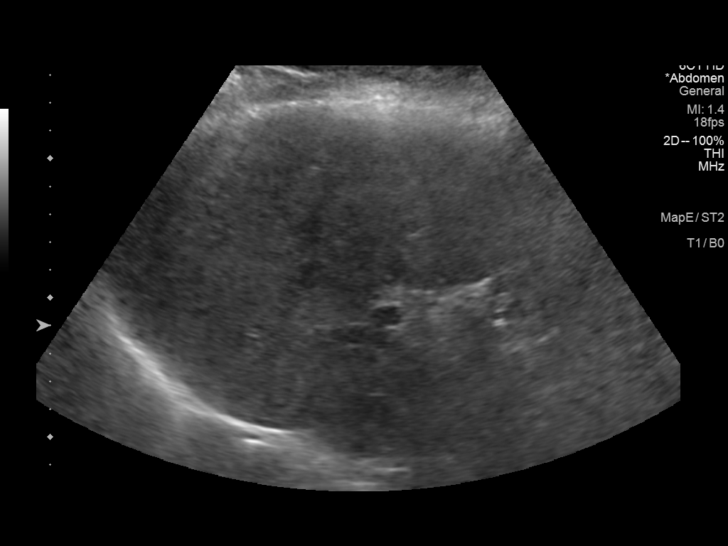
[im 22/30]
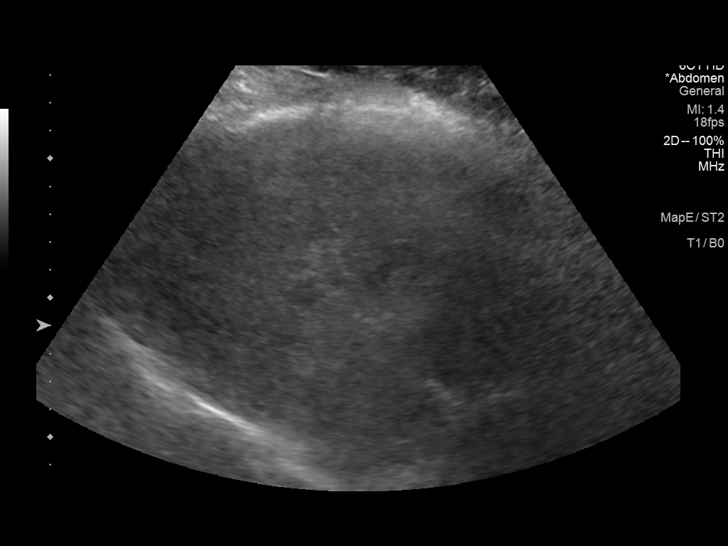
[im 25/30]
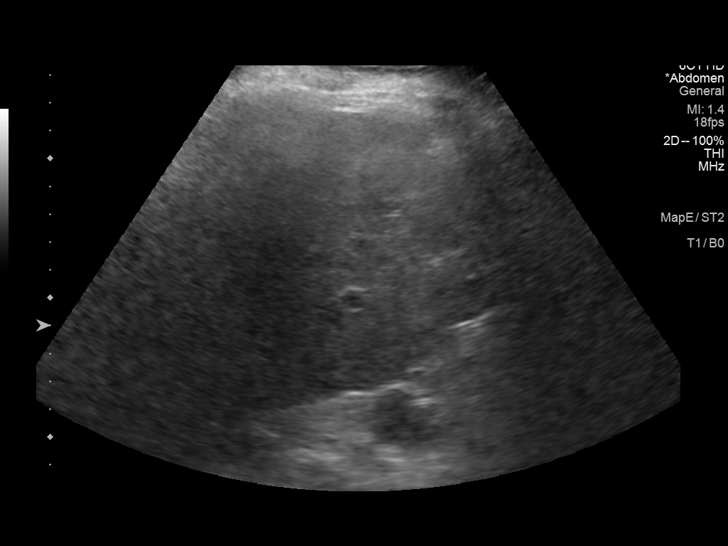
[im 27/30]
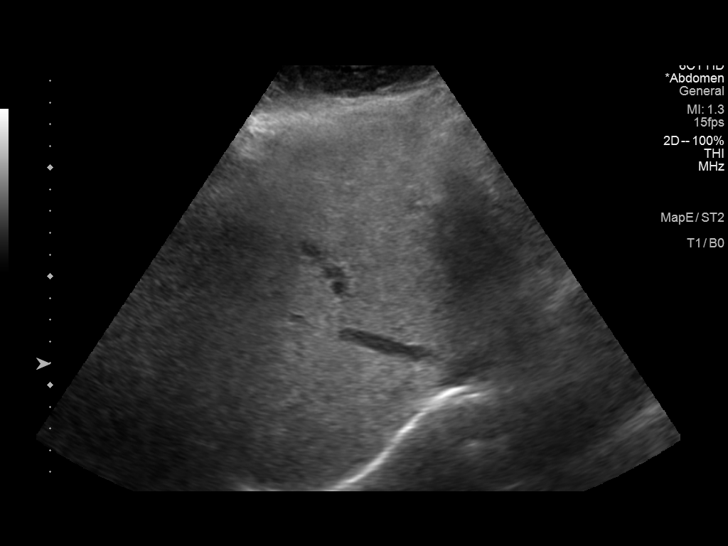
[im 30/30]
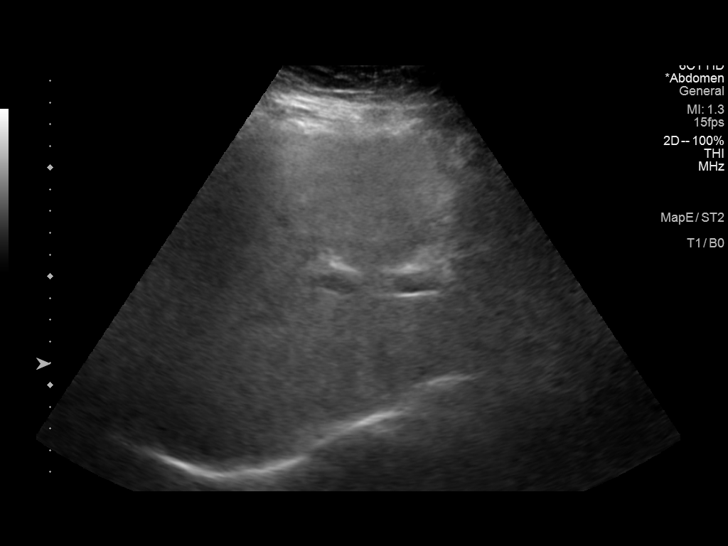

[14 of 25 positions shown; findings below may reference images not displayed]

FINDINGS: Gallbladder:

The gallbladder is surgically absent.

Common bile duct:

Diameter: 6.3 mm

Liver:

No focal lesion identified. The liver parenchyma is heterogeneous in
echogenicity. Portal vein is patent on color Doppler imaging with
normal direction of blood flow towards the liver.

Other: None.
IMPRESSION: 1. Evidence of prior cholecystectomy.
2. Mild diffuse fatty infiltration of the liver.

## 2020-01-18 ENCOUNTER — Other Ambulatory Visit: Payer: Self-pay

## 2020-01-18 ENCOUNTER — Emergency Department (HOSPITAL_COMMUNITY)
Admission: EM | Admit: 2020-01-18 | Discharge: 2020-01-19 | Discharge status: Against Medical Advice | Payer: Medicaid Other

## 2020-01-19 NOTE — ED Notes (Signed)
Called pt x1 for triage No answer

## 2020-01-22 ENCOUNTER — Other Ambulatory Visit: Payer: Self-pay | Admitting: Gastroenterology

## 2020-01-22 DIAGNOSIS — K8020 Calculus of gallbladder without cholecystitis without obstruction: Secondary | ICD-10-CM

## 2020-01-22 DIAGNOSIS — R109 Unspecified abdominal pain: Secondary | ICD-10-CM

## 2020-01-27 ENCOUNTER — Ambulatory Visit
Admission: RE | Admit: 2020-01-27 | Discharge: 2020-01-27 | Disposition: A | Discharge status: Home / Self Care | Payer: Medicaid Other | Source: Ambulatory Visit | Attending: Gastroenterology | Admitting: Gastroenterology

## 2020-01-27 ENCOUNTER — Other Ambulatory Visit: Payer: Self-pay

## 2020-01-27 DIAGNOSIS — R109 Unspecified abdominal pain: Secondary | ICD-10-CM

## 2020-01-27 DIAGNOSIS — K8020 Calculus of gallbladder without cholecystitis without obstruction: Secondary | ICD-10-CM

## 2020-01-27 IMAGING — MR MR ABDOMEN WO/W CM MRCP
13 of 21 series · 26 of 48 positions shown · IV contrast (multihance)
Comparison: Abdominal ultrasound, [DATE]

CLINICAL DATA: Gallstones, abdominal pain, prior cholecystectomy

EXAM:
MRI ABDOMEN WITHOUT AND WITH CONTRAST (INCLUDING MRCP)
TECHNIQUE: Multiplanar multisequence MR imaging of the abdomen was performed
both before and after the administration of intravenous contrast.
Heavily T2-weighted images of the biliary and pancreatic ducts were
obtained, and three-dimensional MRCP images were rendered by post
processing.
CONTRAST:  20mL MULTIHANCE GADOBENATE DIMEGLUMINE 529 MG/ML IV SOLN

[Series 3: cor haste · coronal · 5.0mm · 0.74mm/px · 2 of 39 slices shown]
[im 1/39]
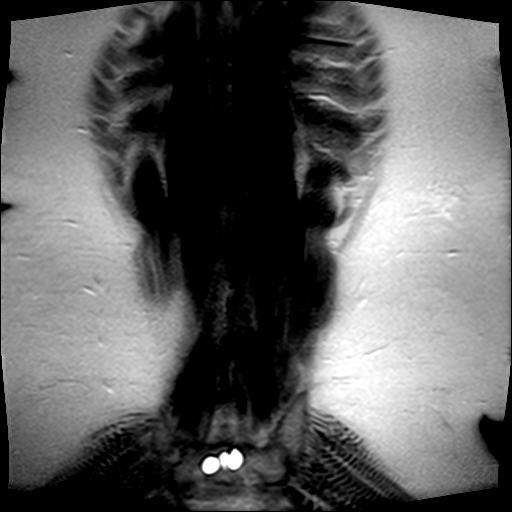
[im 39/39]
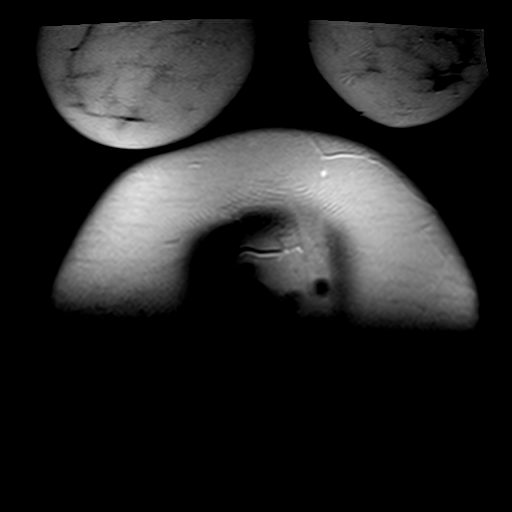

[Series 4: T1 · axial · 6.0mm · 0.88mm/px · z∈[-165,+92]mm · 3 of 80 slices shown]
[im 1/80]
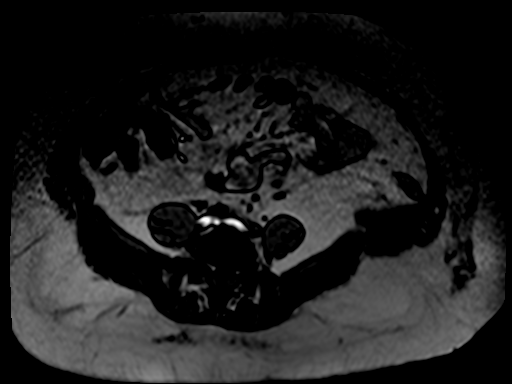
[im 40/80]
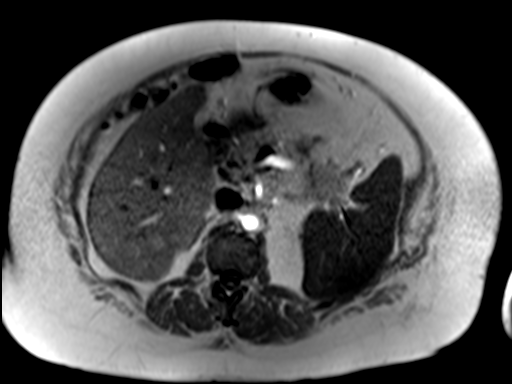
[im 80/80]
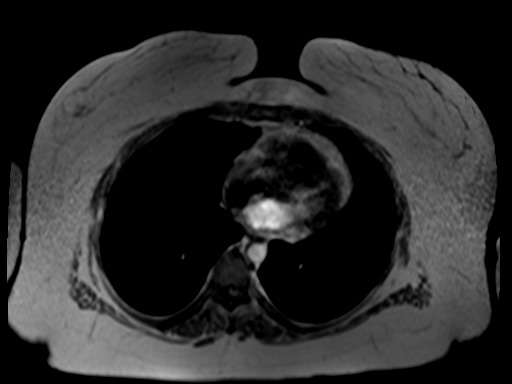

[Series 5: t2_haste_fs_cor_thick_sl_p2_384 · coronal · 50.0mm · 0.78mm/px · 1 of 3 slices shown]
[im 1/3]
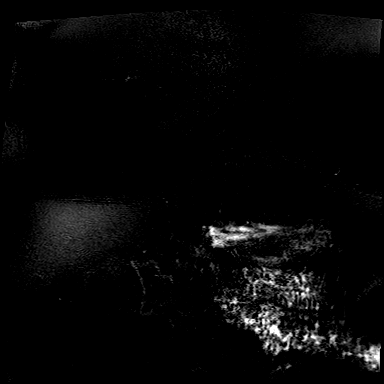

[Series 7: T2 · axial · 6.0mm · 1.31mm/px · 1 of 43 slices shown (1 of 2)]
[im 1/43]
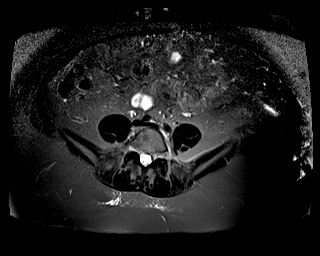

[Series 8: ep2d_diff_b50_500_800_p2_trig · axial · 6.0mm · 2.19mm/px · z∈[-147,+155]mm · 4 of 129 slices shown]
[im 1/129]
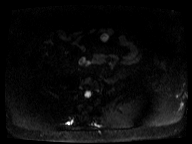
[im 43/129]
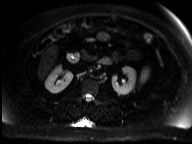
[im 86/129]
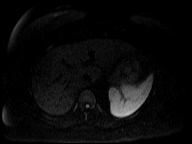
[im 129/129]
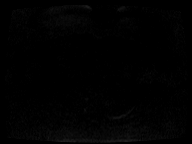

[Series 9: ep2d_diff_b50_500_800_p2_trig_adc · axial · 6.0mm · 2.19mm/px · 1 of 43 slices shown]
[im 1/43]
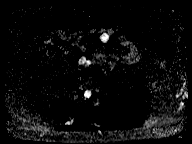

[Series 12: bSSFP · coronal · 5.5mm · 0.82mm/px · 1 of 33 slices shown (1 of 2)]
[im 1/33]
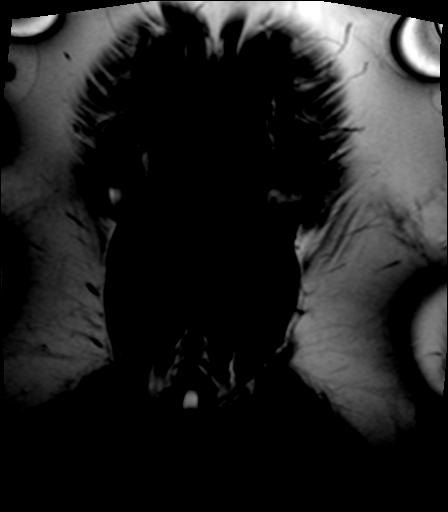

[Series 13: T2 · coronal · 3.0mm · 0.70mm/px · 1 of 47 slices shown (2 of 2)]
[im 1/47]
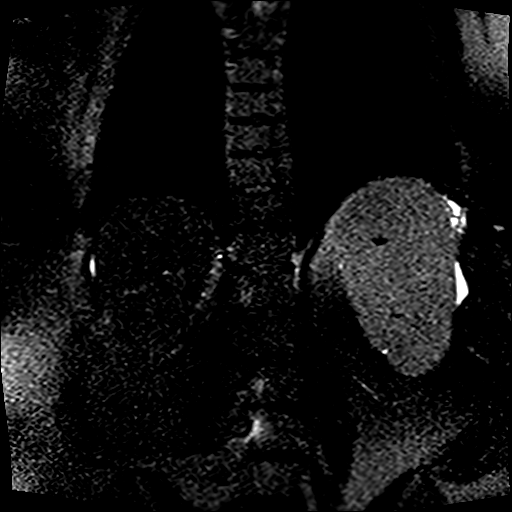

[Series 14: axial haste · axial · 6.0mm · 0.82mm/px · 1 of 41 slices shown]
[im 1/41]
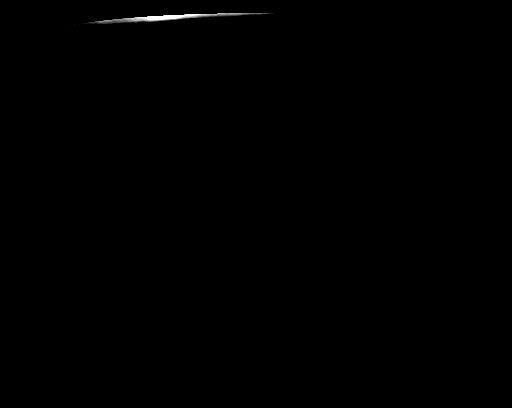

[Series 15: bSSFP · axial · 4.5mm · 0.82mm/px · z∈[-162,+90]mm · 2 of 57 slices shown (2 of 2)]
[im 1/57]
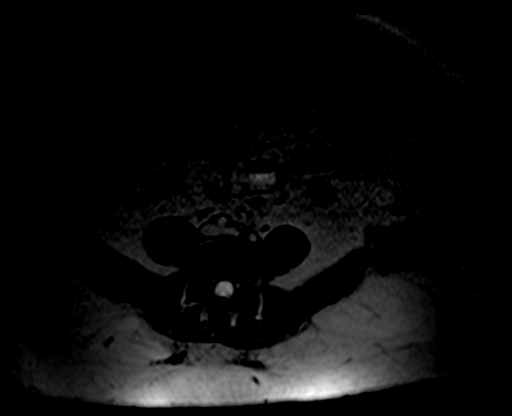
[im 57/57]
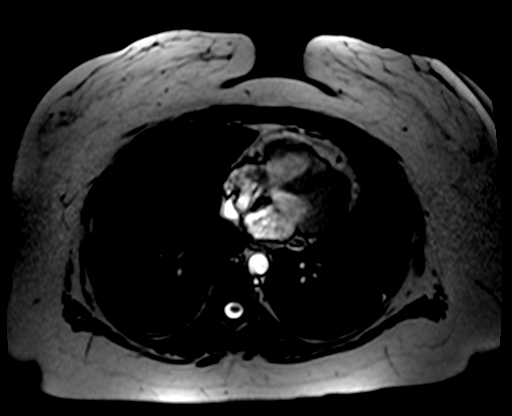

[Series 16: T1 dynamic · axial · non-contrast · 2.5mm · 0.82mm/px · z∈[-164,+93]mm · 3 of 104 slices shown]
[im 1/104]
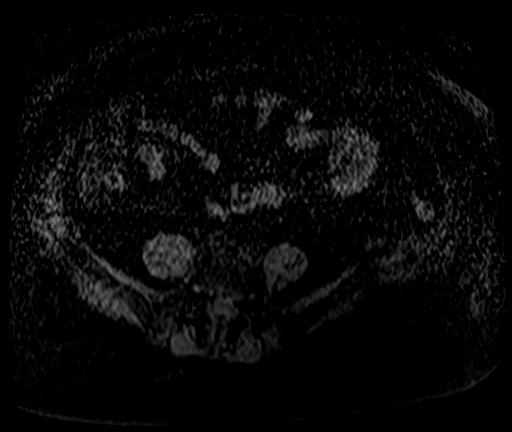
[im 52/104]
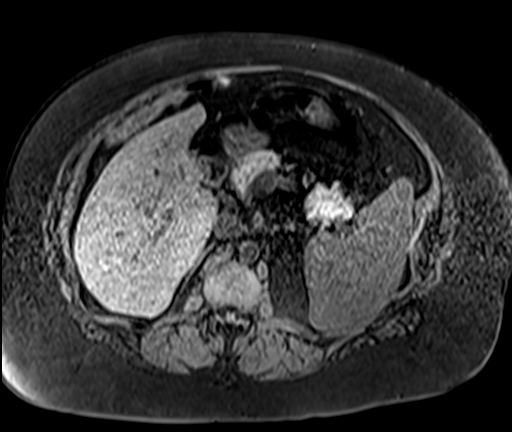
[im 104/104]
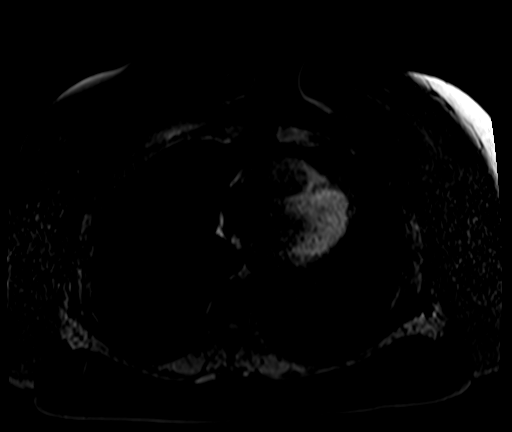

[Series 17: T1 dynamic post-contrast · axial · 2.5mm · 0.82mm/px · z∈[-164,+93]mm · 3 of 104 slices shown (1 of 2)]
[im 1/104]
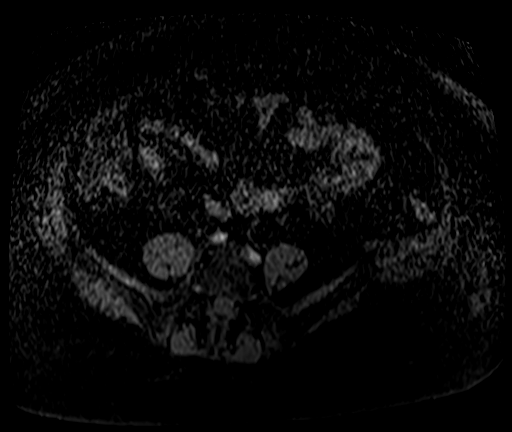
[im 52/104]
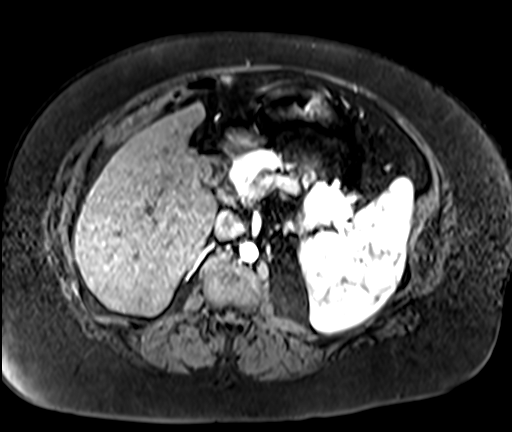
[im 104/104]
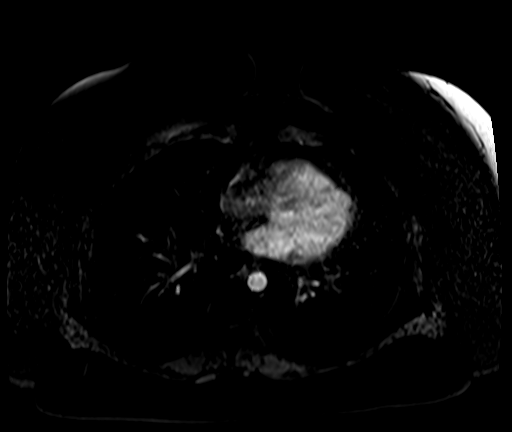

[Series 18: T1 dynamic post-contrast · axial · 2.5mm · 0.82mm/px · z∈[-164,+93]mm · 3 of 104 slices shown (2 of 2)]
[im 1/104]
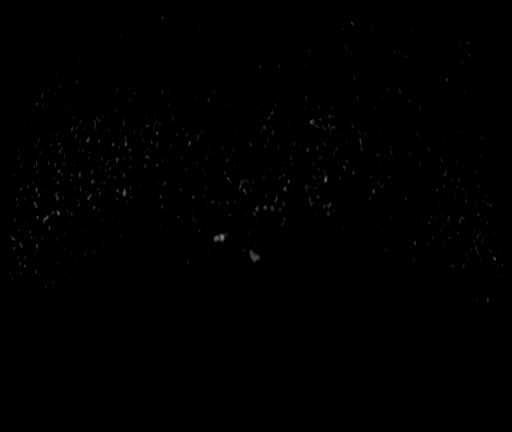
[im 52/104]
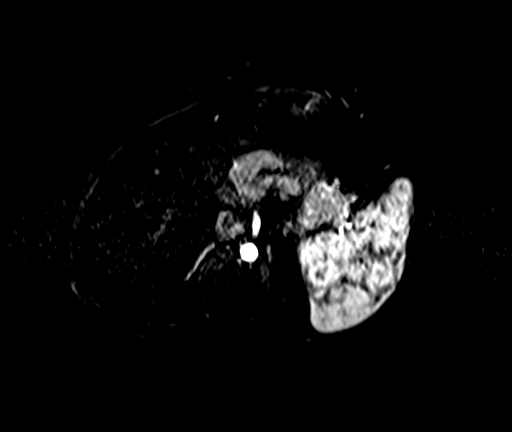
[im 104/104]
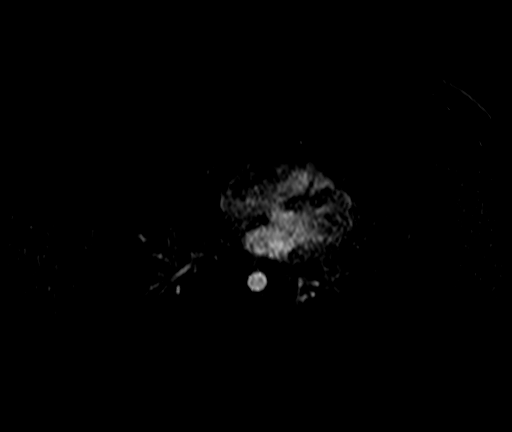

[26 of 48 positions shown; findings below may reference images not displayed]

FINDINGS: Lower chest: No acute findings.

Hepatobiliary: Mild hepatic steatosis. There are intrinsically T1
hyperintense, nonenhancing nodules of the anterior right lobe of the
liver adjacent to the gallbladder fossa measuring 1.2 cm (series 17,
image 47) and of the posterior right lobe of the liver, hepatic
segment VI, measuring 1.4 cm (series 17, image 59). There is a
somewhat T2 heterogeneous parenchymal background and contrast
enhancement pattern. There are at least 2 filling defects within the
common bile duct, within the porta hepatis, peripheral to the
insertion of the cystic duct measuring 5 mm (series 3, image 25) and
in the central portion of the duct within the pancreatic head
measuring 5 mm (series 3, image 23). Status post cholecystectomy. No
biliary ductal dilatation.

Pancreas: No mass, inflammatory changes, or other parenchymal
abnormality identified. No pancreatic ductal dilatation.

Spleen:  Splenomegaly, maximum coronal span 15.0 cm.

Adrenals/Urinary Tract: No masses identified. No evidence of
hydronephrosis.

Stomach/Bowel: Visualized portions within the abdomen are
unremarkable.

Vascular/Lymphatic: No pathologically enlarged lymph nodes
identified. No abdominal aortic aneurysm demonstrated.

Other:  None.

Musculoskeletal: No suspicious bone lesions identified.
IMPRESSION: 1. Choledocholithiasis status post cholecystectomy with at least 2
filling defects within the common bile duct, within the porta
hepatis peripheral to the insertion of the cystic duct, and in the
central portion of the duct within the pancreatic head measuring 5
mm. No biliary ductal dilatation.
2. Mild hepatic steatosis.
3. There is a somewhat T2 heterogeneous parenchymal background
contrast enhancement pattern, suggesting mild cirrhosis.
4. There are nonenhancing nodules of the liver, likely regenerative
in the setting of chronic liver disease, without suspicious contrast
enhancement pattern. Consider ongoing multiphasic contrast enhanced
MR surveillance for hepatocellular carcinoma if cirrhosis or high
risk chronic liver disease is clinically established.
5. Splenomegaly.

## 2020-01-27 MED ORDER — GADOBENATE DIMEGLUMINE 529 MG/ML IV SOLN
20.0000 mL | Freq: Once | INTRAVENOUS | Status: AC | PRN
  Administered 2020-01-27: 20 mL via INTRAVENOUS

## 2020-01-28 ENCOUNTER — Ambulatory Visit: Payer: Medicaid Other | Admitting: Podiatry

## 2020-01-29 ENCOUNTER — Other Ambulatory Visit: Payer: Self-pay | Admitting: Gastroenterology

## 2020-01-29 DIAGNOSIS — K7689 Other specified diseases of liver: Secondary | ICD-10-CM

## 2020-02-22 ENCOUNTER — Other Ambulatory Visit: Payer: Self-pay

## 2020-02-22 ENCOUNTER — Ambulatory Visit
Admission: RE | Admit: 2020-02-22 | Discharge: 2020-02-22 | Disposition: A | Discharge status: Home / Self Care | Payer: Medicaid Other | Source: Ambulatory Visit | Attending: Gastroenterology | Admitting: Gastroenterology

## 2020-02-22 DIAGNOSIS — K7689 Other specified diseases of liver: Secondary | ICD-10-CM

## 2020-02-22 IMAGING — MR MR ABDOMEN WO/W CM
19 series · 48 of 48 positions shown · IV contrast (multihance)
Comparison: [DATE]

CLINICAL DATA: Epigastric abdominal pain. Follow-up of liver
lesions.

EXAM:
MRI ABDOMEN WITHOUT AND WITH CONTRAST
TECHNIQUE: Multiplanar multisequence MR imaging of the abdomen was performed
both before and after the administration of intravenous contrast.
CONTRAST:  20mL MULTIHANCE GADOBENATE DIMEGLUMINE 529 MG/ML IV SOLN

[Series 4: DWI · axial · 6.0mm · 1.57mm/px · z∈[-71,+181]mm · 3 of 108 slices shown (1 of 4)]
[im 1/108]
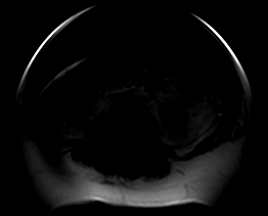
[im 54/108]
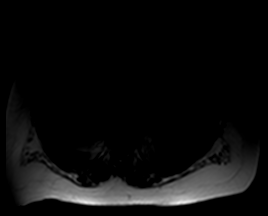
[im 108/108]
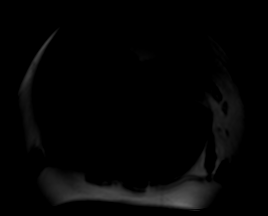

[Series 5: DWI · axial · 6.0mm · 1.57mm/px · 1 of 36 slices shown (2 of 4)]
[im 1/36]
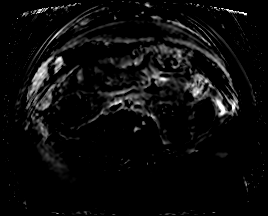

[Series 6: T2 · axial · 6.0mm · 1.64mm/px · 1 of 38 slices shown (1 of 3)]
[im 1/38]
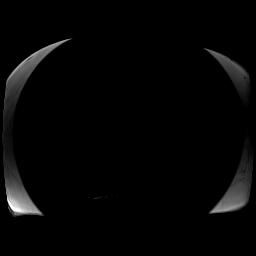

[Series 8: DWI · axial · 6.0mm · 1.57mm/px · z∈[-71,+181]mm · 4 of 108 slices shown (3 of 4)]
[im 1/108]
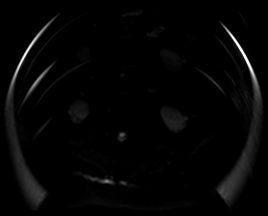
[im 36/108]
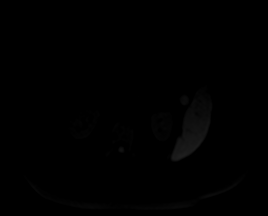
[im 72/108]
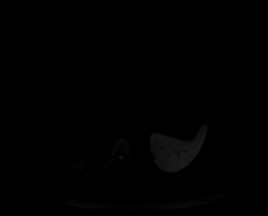
[im 108/108]
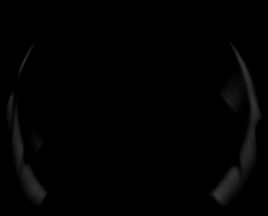

[Series 9: DWI · axial · 6.0mm · 1.57mm/px · 1 of 36 slices shown (4 of 4)]
[im 1/36]
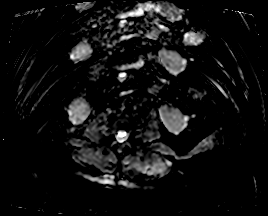

[Series 10: T2 · coronal · 5.0mm · 1.64mm/px · 1 of 36 slices shown (2 of 3)]
[im 1/36]
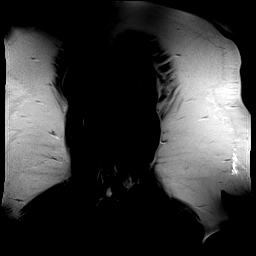

[Series 11: T1 · axial · 3.5mm · 1.31mm/px · z∈[-107,+142]mm · 5 of 144 slices shown]
[im 1/144]
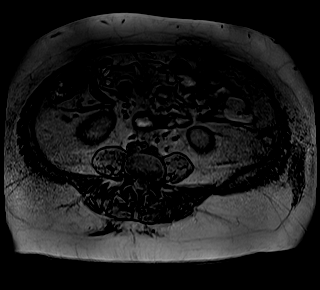
[im 36/144]
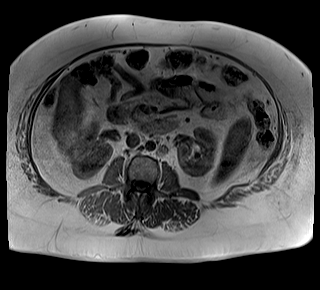
[im 72/144]
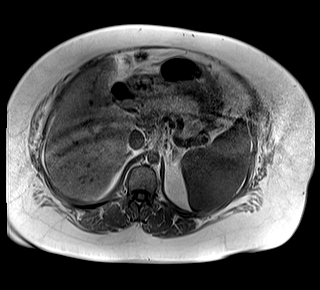
[im 108/144]
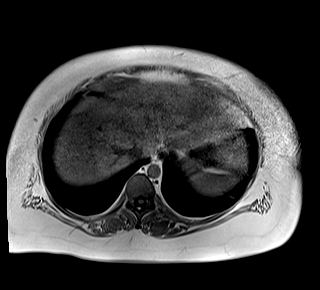
[im 144/144]
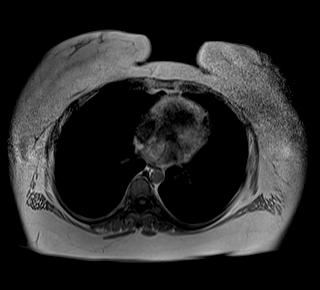

[Series 12: T2 · axial · 6.5mm · 1.31mm/px · 1 of 30 slices shown (3 of 3)]
[im 1/30]
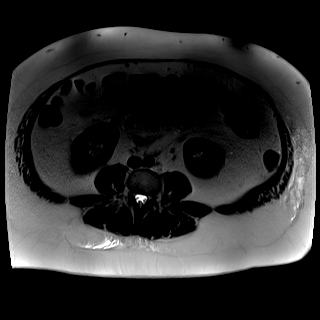

[Series 13: bSSFP · axial · 6.0mm · 1.31mm/px · 1 of 38 slices shown]
[im 1/38]
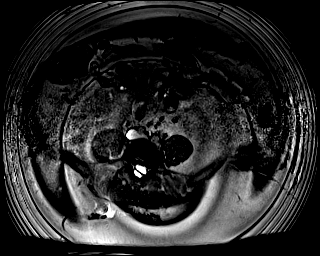

[Series 14: T1 dynamic · axial · non-contrast · 3.5mm · 1.31mm/px · z∈[-107,+142]mm · 3 of 72 slices shown]
[im 1/72]
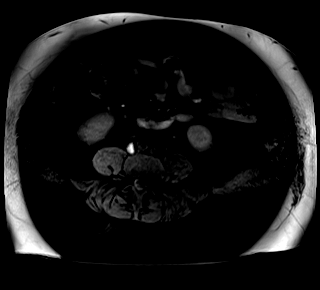
[im 36/72]
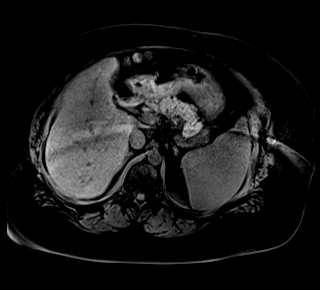
[im 72/72]
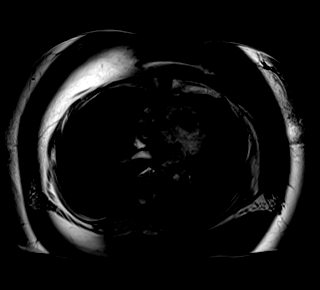

[Series 15: T1 dynamic post-contrast · axial · 3.5mm · 1.31mm/px · z∈[-107,+142]mm · 3 of 72 slices shown (1 of 9)]
[im 1/72]
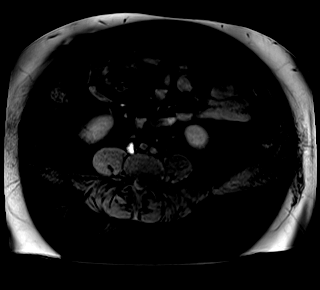
[im 36/72]
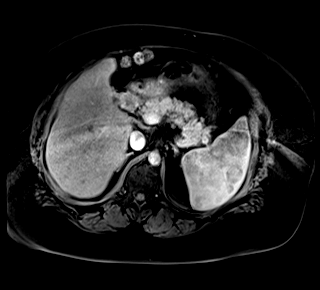
[im 72/72]
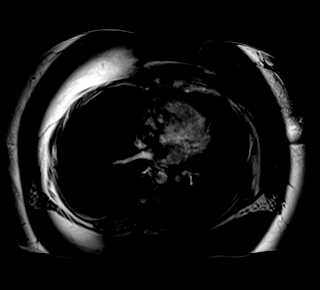

[Series 16: T1 dynamic post-contrast · axial · 3.5mm · 1.31mm/px · z∈[-107,+142]mm · 3 of 72 slices shown (2 of 9)]
[im 1/72]
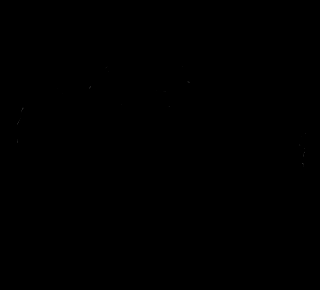
[im 36/72]
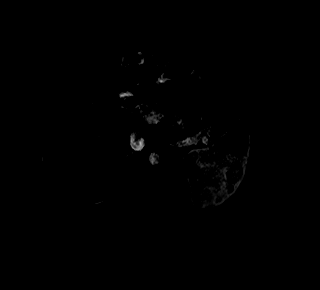
[im 72/72]
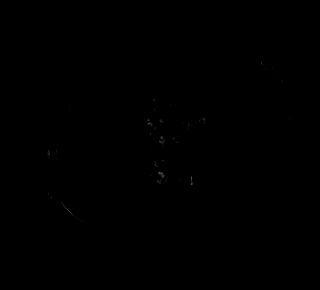

[Series 17: T1 dynamic post-contrast · axial · 3.5mm · 1.31mm/px · z∈[-107,+142]mm · 3 of 72 slices shown (3 of 9)]
[im 1/72]
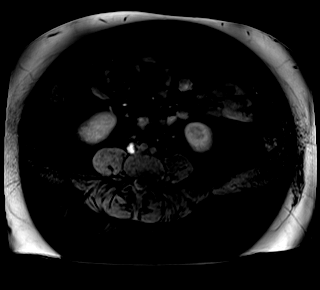
[im 36/72]
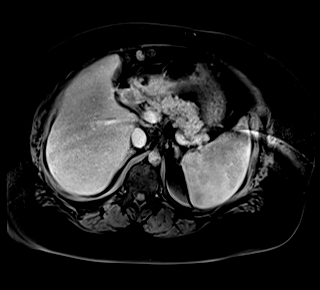
[im 72/72]
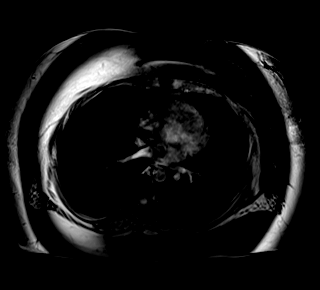

[Series 18: T1 dynamic post-contrast · axial · 3.5mm · 1.31mm/px · z∈[-107,+142]mm · 3 of 72 slices shown (4 of 9)]
[im 1/72]
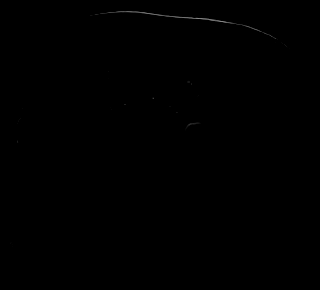
[im 36/72]
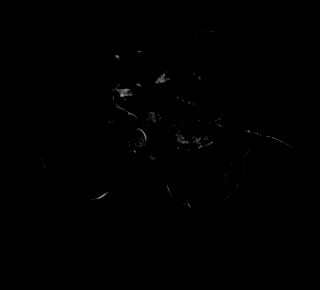
[im 72/72]
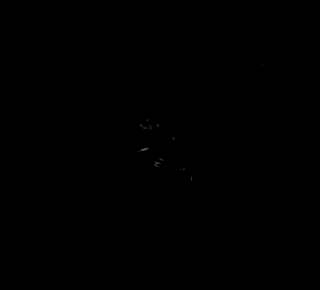

[Series 19: T1 dynamic post-contrast · axial · 3.5mm · 1.31mm/px · z∈[-107,+142]mm · 3 of 72 slices shown (5 of 9)]
[im 1/72]
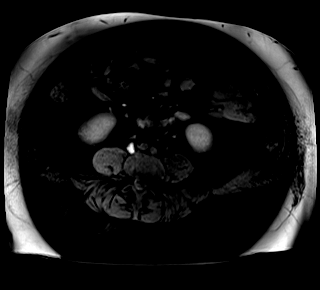
[im 36/72]
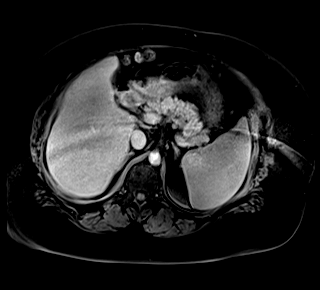
[im 72/72]
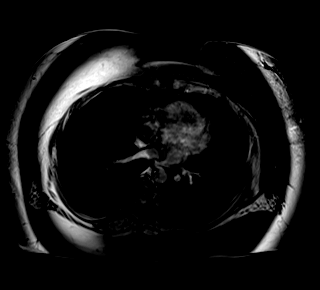

[Series 20: T1 dynamic post-contrast · axial · 3.5mm · 1.31mm/px · z∈[-107,+142]mm · 3 of 72 slices shown (6 of 9)]
[im 1/72]
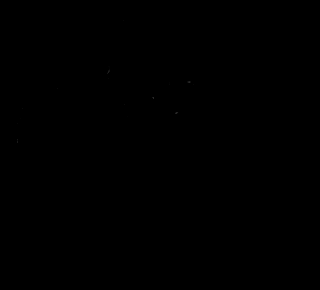
[im 36/72]
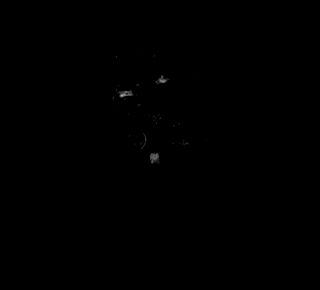
[im 72/72]
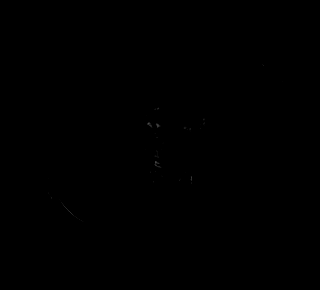

[Series 21: T1 dynamic post-contrast · coronal · 3.0mm · 1.41mm/px · 3 of 72 slices shown (7 of 9)]
[im 1/72]
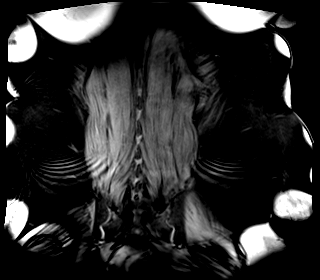
[im 36/72]
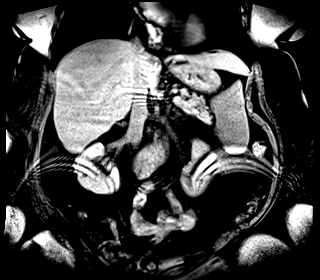
[im 72/72]
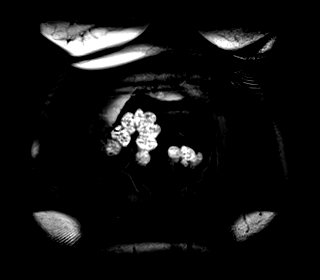

[Series 22: T1 dynamic post-contrast · axial · 3.5mm · 1.31mm/px · z∈[-107,+142]mm · 3 of 72 slices shown (8 of 9)]
[im 1/72]
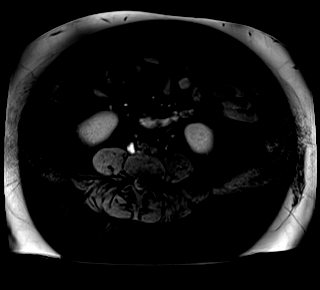
[im 36/72]
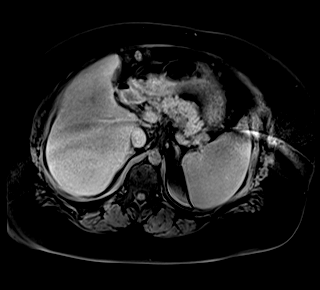
[im 72/72]
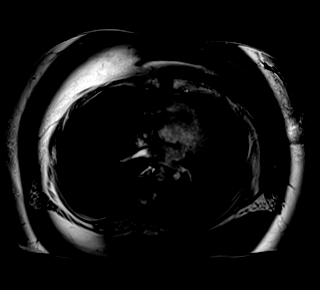

[Series 23: T1 dynamic post-contrast · axial · 3.5mm · 1.31mm/px · z∈[-107,+142]mm · 3 of 72 slices shown (9 of 9)]
[im 1/72]
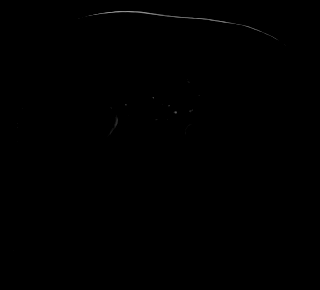
[im 36/72]
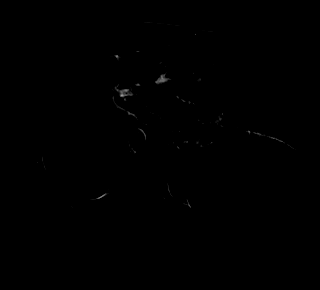
[im 72/72]
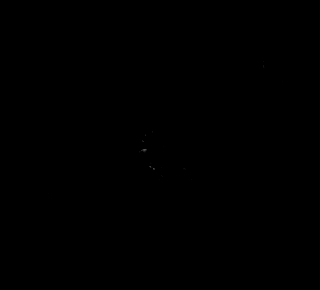

[48 of 48 positions shown; findings below may reference images not displayed]

FINDINGS: Mild limitations secondary to patient body habitus. There is also
suboptimal parenchymal contrast opacification

Lower chest: Normal heart size without pericardial or pleural
effusion.

Hepatobiliary: Mild cirrhosis. Again identified are 3 T1
hyperintense liver lesions without evidence of post-contrast
enhancement. Examples at 1.0 cm in segment 4 B on 33/14, 1.0 cm in
segment 6 on 45/14 and 8 mm in segment 7 on 45/14. These are
relatively similar in size to the recent exam.

No suspicious liver lesion. Cholecystectomy. The more caudal of the
previously described common duct stones is again suspected,
including at 4 mm on [DATE].

No biliary duct dilatation.

Pancreas:  Normal, without mass or ductal dilatation.

Spleen:  Borderline splenomegaly at 13.6 cm.

Adrenals/Urinary Tract: Normal adrenal glands. Too small to
characterize lesions in both kidneys. No hydronephrosis.

Stomach/Bowel: Normal stomach and abdominal bowel loops.

Vascular/Lymphatic: Normal aortic caliber. Patent portal and splenic
veins.Prominent porta hepatis nodes are likely related to cirrhosis,
including at maximally 8 mm on [DATE].

Other:  No ascites.

Musculoskeletal: No acute osseous abnormality.
IMPRESSION: 1. Mild degradation, as detailed above.
2. Similar size (over less than a month) of 3 T1 hyperintense
nonenhancing liver lesions which are indeterminate but most likely
dysplastic nodules. LR 2.
3. Cholecystectomy without biliary duct dilatation. Suspicion of
distal common duct stone on the order of 4 mm.

## 2020-02-22 MED ORDER — GADOBENATE DIMEGLUMINE 529 MG/ML IV SOLN
20.0000 mL | Freq: Once | INTRAVENOUS | Status: AC | PRN
  Administered 2020-02-22: 20 mL via INTRAVENOUS

## 2020-03-12 ENCOUNTER — Other Ambulatory Visit (HOSPITAL_COMMUNITY)
Admission: RE | Admit: 2020-03-12 | Discharge: 2020-03-12 | Disposition: A | Discharge status: Home / Self Care | Payer: Medicaid Other | Source: Ambulatory Visit | Attending: Gastroenterology | Admitting: Gastroenterology

## 2020-03-12 DIAGNOSIS — Z20822 Contact with and (suspected) exposure to covid-19: Secondary | ICD-10-CM | POA: Insufficient documentation

## 2020-03-12 DIAGNOSIS — Z01812 Encounter for preprocedural laboratory examination: Secondary | ICD-10-CM | POA: Diagnosis not present

## 2020-03-12 LAB — SARS CORONAVIRUS 2 (TAT 6-24 HRS): SARS Coronavirus 2: NEGATIVE

## 2020-03-15 NOTE — H&P (Signed)
Rhonda Higgins is an 49 y.o. female.    Chief Complaint: abnormal MRCP  HPI: 4 mm CBD stone noted on MRCP on 02/22/20   49 year old female, last seen in 03/2023 early satiety, bloating, change in bowel habits and hepatic steatosis.        Gastric emptying scan from 04/2019 was reported as normal.        EGD from 11/2018 showed biliary gastritis, normal biopsies for H. pylori and celiac disease.        Colonoscopy from 11/2018 showed removal of 2 tubular adenomas, 2 sessile serrated adenomas, repeat recommended to 3 years.        She is status post ERCP in 3/19 for CBD stones.        Noted to have nodular liver during cholecystectomy however workup for chronic liver diseases negative.        She has been taking IBgard once a day and that "irritaion from her bowel are gone".        Last night she got sick on her stomach, she took zofran, she usually takes it three times a week.        Denies vomiting, she has lack of appetite.        Last night her BS was 124 without medications.        Usually BS fluctuate a lot, from 76 to 282.        She takes pantoprazole 40 mg BID for acid reflux.        She takes dicyclomine as needed twice a month.        Her Bms are "fine", she has them daily,when she has a "flare up" she has several Bms-loose, all day long.        Anytime, she gets sick to her stomach, stools are loose.  Past Medical History:  Diagnosis Date  . Adenocarcinoma (HCC)    endometrial, FIGO GRADE 1  . Allergic rhinitis   . Atypical chest pain    History of  . Depression   . Elevated liver enzymes   . GERD (gastroesophageal reflux disease)   . Hematuria   . History of endometrial cancer 08-02-2009   oncologist-  dr brewster/ Denman George and dr kinard/  no recurrence   endometrial adenocarinoma Stage 1B, Grade 1, FIGO--  s/p  TAH w/ BSO and pelvic lymph node dissection's and radiation therapy  . History of kidney stones   . History of radiation therapy    2011  pelvic intracavity  brachytherapy treatment's for endometrial carcinoma  . History of thyroid nodule    multinodular goiter s/p  total thyroidectomy 11-19-2015  per pathology -  adenomatoid nodules  . Hyperlipidemia   . Hypothyroidism, postsurgical   . Insulin dependent diabetes mellitus    Type 2  . Left ureteral stone   . Obesity   . OSA (obstructive sleep apnea)    severe OSA  per study 03-08-2010--  noncomplant cpap  . Overactive bladder   . Personality disorder ([place])   . Polyphagia(783.6)   . PONV (postoperative nausea and vomiting)    after ear surgery only one time  . Right lower quadrant pain   . Urgency of urination   . UTI (urinary tract infection)     Past Surgical History:  Procedure Laterality Date  . CARDIOVASCULAR STRESS TEST  06/09/2008   normal nuclear study w/ no ischemia/  normal LV function and wall motion , ef 83%  . CHOLECYSTECTOMY N/A 09/06/2017  Procedure: LAPAROSCOPIC CHOLECYSTECTOMY WITH INTRAOPERATIVE CHOLANGIOGRAM;  Surgeon: Armandina Gemma, MD;  Location: [facility];  Service: General;  Laterality: N/A;  . CYSTOSCOPY/RETROGRADE/URETEROSCOPY/STONE EXTRACTION WITH BASKET Left 03/08/2016   Procedure: CYSTOSCOPY/RETROGRADE/URETEROSCOPY/STONE EXTRACTION WITH BASKET, STENT PLACEMENT;  Surgeon: Rana Snare, MD;  [facility];  Service: Urology;  Laterality: Left;  . ENDOMETRIAL BIOPSY    . ERCP N/A 09/07/2017   Procedure: ENDOSCOPIC RETROGRADE CHOLANGIOPANCREATOGRAPHY (ERCP);  Surgeon: [name], MD;  Location: [facility];  Service: Gastroenterology;  Laterality: N/A;  . HOLMIUM LASER APPLICATION Left 0/03/9322   Procedure: HOLMIUM LASER APPLICATION;  Surgeon: Rana Snare, MD;  Location[facility];  Service: Urology;  Laterality: Left;  . MOUTH SURGERY    . MYRINGECOTMY W/ REMOVAL MIDDLE EAR CHOLESTEATOMA TYPE 1 FASICA TYMPANOPLASTY  09/13/2000  . ROBOTIC ASSISTED TOTAL HYSTERECTOMY WITH BILATERAL SALPINGO OOPHERECTOMY  08-02-2009   at [facility]   dr Denman George   w/  Bilateral pelvic and para aortic lymph node dissection's  . THYROIDECTOMY N/A 11/19/2015   Procedure: TOTAL THYROIDECTOMY;  Surgeon: Armandina Gemma, MD;  Location: [facility];  Service: General;  Laterality: N/A;  . TONSILLECTOMY  age 54  . TRANSTHORACIC ECHOCARDIOGRAM  07/19/2014   ef 55-60%/  trivial TR  . TYMPANOPLASTY Right 1993    Family History  Problem Relation Age of Onset  . Heart disease Sister   . Heart attack Brother   . Heart disease Brother   . Heart disease Sister   . Diabetes Sister   . Breast cancer Sister   . Asthma Mother   . Heart disease Mother   . Diabetes Mother   . Emphysema Mother   . Hypertension Mother   . Stroke Mother   . Prostate cancer Father    Social History:  reports that she has never smoked. She has never used smokeless tobacco. She reports that she does not drink alcohol and does not use drugs.  Allergies:  Allergies  Allergen Reactions  . Hydrocodone Shortness Of Breath and Itching  . Tape Rash    Paper tape is ok  . Codeine Itching    No medications prior to admission.    No results found for this or any previous visit (from the past 48 hour(s)). No results found.  Review of Systems Review of Systems  GI PROCEDURE:          no Pacemaker/ AICD, no.  no Artificial heart valves.  no MI/heart attack.  no Abnormal heart rhythm.  no Angina.  no CVA.  no Hypertension.  no Hypotension.  no Asthma, COPD.  no Sleep apnea.  no Seizure disorders.  no Artificial joints.  Diabetes  YES, YES, type II.  no Significant headaches.  no Vertigo.  Depression/anxiety  YES.  no Abnormal bleeding.  no Kidney Disease.  no Liver disease, no.  no Blood transfusion.       Physical Exam   Vital Signs  Wt 233.5, Wt change 2.5 lb, Ht 63, BMI 41.36, Temp 98.0, Pulse sitting 73, BP sitting 117/70 LA.     Examination  Gastroenterology::        GENERAL APPEARANCE: Well developed, obese, pleasant, no acute distress .         SCLERA: anicteric.          CARDIOVASCULAR PMI LS border. Normal RRR w/o murmers or gallops. No peripheral edema.         RESPIRATORY Breath sounds normal. Respiration even and unlabored.  ABDOMEN No masses palpated. Liver and spleen not palpated, normal. Bowel sounds normal, Abdomen not distended.         EXTREMITIES: No edema.         NEURO: alert,oriented to time, place and person, normal gait.         PSYCH: mood/affect normal.        Assessment/Plan CBD stone ERCP on 03/15/20  [name], MD 03/15/2020, 3:46 PM

## 2020-03-16 ENCOUNTER — Ambulatory Visit (HOSPITAL_COMMUNITY)
Admission: RE | Admit: 2020-03-16 | Discharge: 2020-03-16 | Disposition: A | Discharge status: Home / Self Care | Payer: Medicaid Other | Attending: Gastroenterology | Admitting: Gastroenterology

## 2020-03-16 ENCOUNTER — Encounter (HOSPITAL_COMMUNITY): Payer: Self-pay | Admitting: Gastroenterology

## 2020-03-16 ENCOUNTER — Encounter (HOSPITAL_COMMUNITY)
Admission: RE | Disposition: A | Discharge status: Home / Self Care | Payer: Self-pay | Source: Home / Self Care | Attending: Gastroenterology

## 2020-03-16 ENCOUNTER — Ambulatory Visit (HOSPITAL_COMMUNITY): Payer: Medicaid Other

## 2020-03-16 ENCOUNTER — Ambulatory Visit (HOSPITAL_COMMUNITY): Payer: Medicaid Other | Admitting: Anesthesiology

## 2020-03-16 DIAGNOSIS — Z923 Personal history of irradiation: Secondary | ICD-10-CM | POA: Diagnosis not present

## 2020-03-16 DIAGNOSIS — K219 Gastro-esophageal reflux disease without esophagitis: Secondary | ICD-10-CM | POA: Diagnosis not present

## 2020-03-16 DIAGNOSIS — N3281 Overactive bladder: Secondary | ICD-10-CM | POA: Insufficient documentation

## 2020-03-16 DIAGNOSIS — E119 Type 2 diabetes mellitus without complications: Secondary | ICD-10-CM | POA: Diagnosis not present

## 2020-03-16 DIAGNOSIS — Z833 Family history of diabetes mellitus: Secondary | ICD-10-CM | POA: Insufficient documentation

## 2020-03-16 DIAGNOSIS — R632 Polyphagia: Secondary | ICD-10-CM | POA: Diagnosis not present

## 2020-03-16 DIAGNOSIS — Z8249 Family history of ischemic heart disease and other diseases of the circulatory system: Secondary | ICD-10-CM | POA: Insufficient documentation

## 2020-03-16 DIAGNOSIS — K8050 Calculus of bile duct without cholangitis or cholecystitis without obstruction: Secondary | ICD-10-CM | POA: Insufficient documentation

## 2020-03-16 DIAGNOSIS — Z90710 Acquired absence of both cervix and uterus: Secondary | ICD-10-CM | POA: Diagnosis not present

## 2020-03-16 DIAGNOSIS — E785 Hyperlipidemia, unspecified: Secondary | ICD-10-CM | POA: Diagnosis not present

## 2020-03-16 DIAGNOSIS — E669 Obesity, unspecified: Secondary | ICD-10-CM | POA: Insufficient documentation

## 2020-03-16 DIAGNOSIS — K766 Portal hypertension: Secondary | ICD-10-CM | POA: Insufficient documentation

## 2020-03-16 DIAGNOSIS — G4733 Obstructive sleep apnea (adult) (pediatric): Secondary | ICD-10-CM | POA: Diagnosis not present

## 2020-03-16 DIAGNOSIS — F609 Personality disorder, unspecified: Secondary | ICD-10-CM | POA: Insufficient documentation

## 2020-03-16 DIAGNOSIS — F329 Major depressive disorder, single episode, unspecified: Secondary | ICD-10-CM | POA: Diagnosis not present

## 2020-03-16 DIAGNOSIS — Z87442 Personal history of urinary calculi: Secondary | ICD-10-CM | POA: Diagnosis not present

## 2020-03-16 DIAGNOSIS — Z9049 Acquired absence of other specified parts of digestive tract: Secondary | ICD-10-CM | POA: Insufficient documentation

## 2020-03-16 DIAGNOSIS — K760 Fatty (change of) liver, not elsewhere classified: Secondary | ICD-10-CM | POA: Insufficient documentation

## 2020-03-16 DIAGNOSIS — Z86010 Personal history of colonic polyps: Secondary | ICD-10-CM | POA: Diagnosis not present

## 2020-03-16 DIAGNOSIS — Z90722 Acquired absence of ovaries, bilateral: Secondary | ICD-10-CM | POA: Insufficient documentation

## 2020-03-16 DIAGNOSIS — Z803 Family history of malignant neoplasm of breast: Secondary | ICD-10-CM | POA: Insufficient documentation

## 2020-03-16 DIAGNOSIS — Z87440 Personal history of urinary (tract) infections: Secondary | ICD-10-CM | POA: Insufficient documentation

## 2020-03-16 DIAGNOSIS — E890 Postprocedural hypothyroidism: Secondary | ICD-10-CM | POA: Diagnosis not present

## 2020-03-16 DIAGNOSIS — Z885 Allergy status to narcotic agent status: Secondary | ICD-10-CM | POA: Insufficient documentation

## 2020-03-16 DIAGNOSIS — Z8542 Personal history of malignant neoplasm of other parts of uterus: Secondary | ICD-10-CM | POA: Insufficient documentation

## 2020-03-16 DIAGNOSIS — Z6841 Body Mass Index (BMI) 40.0 and over, adult: Secondary | ICD-10-CM | POA: Insufficient documentation

## 2020-03-16 DIAGNOSIS — Z888 Allergy status to other drugs, medicaments and biological substances status: Secondary | ICD-10-CM | POA: Insufficient documentation

## 2020-03-16 DIAGNOSIS — Z825 Family history of asthma and other chronic lower respiratory diseases: Secondary | ICD-10-CM | POA: Insufficient documentation

## 2020-03-16 DIAGNOSIS — Z8042 Family history of malignant neoplasm of prostate: Secondary | ICD-10-CM | POA: Insufficient documentation

## 2020-03-16 HISTORY — PX: ESOPHAGOGASTRODUODENOSCOPY (EGD) WITH PROPOFOL: SHX5813

## 2020-03-16 LAB — GLUCOSE, CAPILLARY
Glucose-Capillary: 99 mg/dL (ref 70–99)
Glucose-Capillary: 221 mg/dL — ABNORMAL HIGH (ref 70–99)

## 2020-03-16 IMAGING — RF DG ERCP WO/W SPHINCTEROTOMY
1 series · 1 of 1 positions shown · non-contrast
Comparison: MRI [DATE]

CLINICAL DATA: Unsuccessful ERCP.

EXAM:
ERCP
TECHNIQUE: Multiple spot images obtained with the fluoroscopic device and
submitted for interpretation post-procedure.
FLUOROSCOPY TIME:  Fluoroscopy Time:  3 seconds
Number of Acquired Spot Images: 1

[Series 1: run · 1 of 1 slices shown]
[im 1/1]
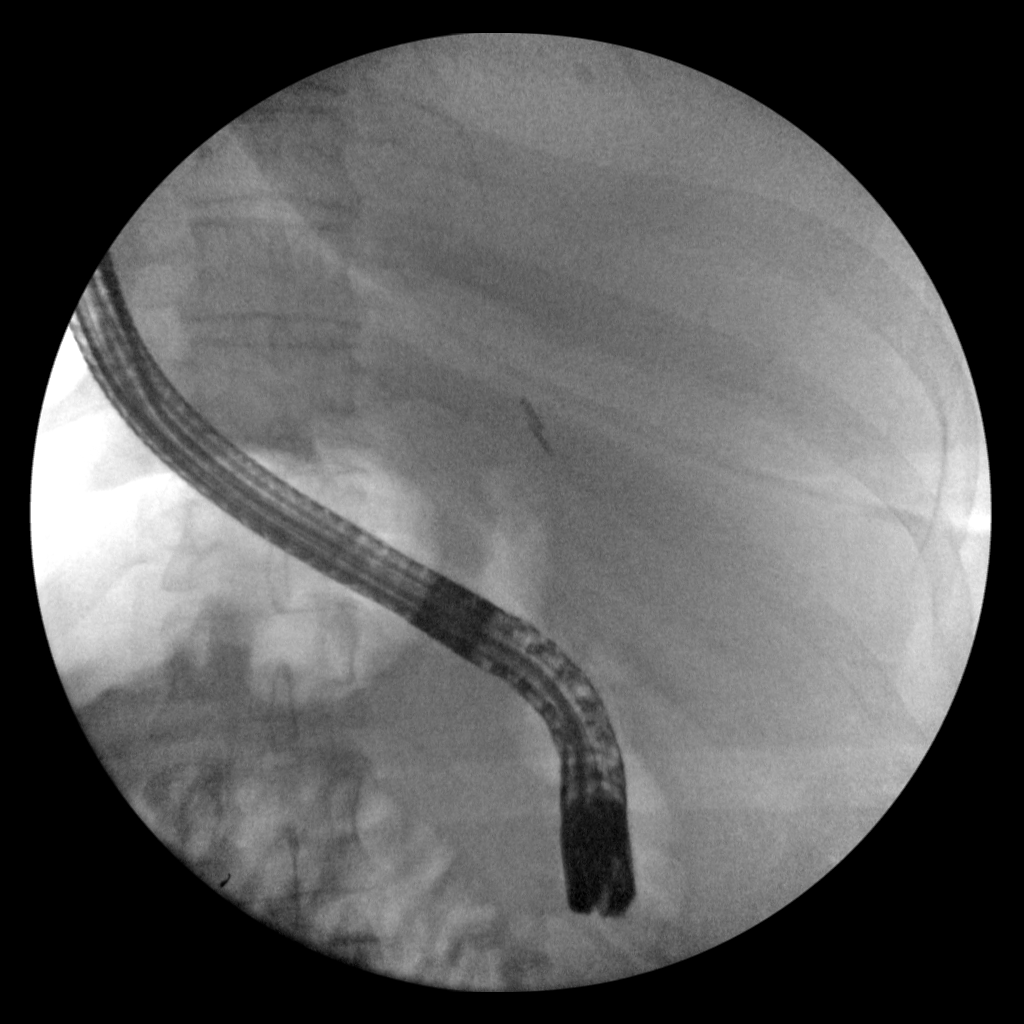

[1 of 1 positions shown; findings below may reference images not displayed]

FINDINGS: Single fluoroscopic image demonstrates endoscope in the upper
abdomen.
IMPRESSION: Unsuccessful ERCP.

These images were submitted for radiologic interpretation only.
Please see the procedural report for the amount of contrast and the
fluoroscopy time utilized.

## 2020-03-16 SURGERY — ESOPHAGOGASTRODUODENOSCOPY (EGD) WITH PROPOFOL
Anesthesia: General

## 2020-03-16 MED ORDER — LACTATED RINGERS IV SOLN
INTRAVENOUS | Status: DC
  Administered 2020-03-16: 1000 mL via INTRAVENOUS

## 2020-03-16 MED ORDER — GLUCAGON HCL RDNA (DIAGNOSTIC) 1 MG IJ SOLR
INTRAMUSCULAR | Status: AC
  Filled 2020-03-16: qty 1

## 2020-03-16 MED ORDER — MIDAZOLAM HCL 2 MG/2ML IJ SOLN
INTRAMUSCULAR | Status: DC | PRN
  Administered 2020-03-16: 2 mg via INTRAVENOUS

## 2020-03-16 MED ORDER — FENTANYL CITRATE (PF) 100 MCG/2ML IJ SOLN
INTRAMUSCULAR | Status: DC | PRN
Start: 2020-03-16 — End: 2020-03-16
  Administered 2020-03-16: 100 ug via INTRAVENOUS
  Administered 2020-03-16: 50 ug via INTRAVENOUS

## 2020-03-16 MED ORDER — MIDAZOLAM HCL 2 MG/2ML IJ SOLN
INTRAMUSCULAR | Status: AC
  Filled 2020-03-16: qty 2

## 2020-03-16 MED ORDER — CIPROFLOXACIN IN D5W 400 MG/200ML IV SOLN
INTRAVENOUS | Status: AC
  Filled 2020-03-16: qty 200

## 2020-03-16 MED ORDER — INDOMETHACIN 50 MG RE SUPP
RECTAL | Status: AC
  Filled 2020-03-16: qty 2

## 2020-03-16 MED ORDER — DEXAMETHASONE SODIUM PHOSPHATE 10 MG/ML IJ SOLN
INTRAMUSCULAR | Status: DC | PRN
  Administered 2020-03-16: 10 mg via INTRAVENOUS

## 2020-03-16 MED ORDER — PROPOFOL 10 MG/ML IV BOLUS
INTRAVENOUS | Status: AC
  Filled 2020-03-16: qty 20

## 2020-03-16 MED ORDER — ONDANSETRON HCL 4 MG/2ML IJ SOLN
INTRAMUSCULAR | Status: DC | PRN
  Administered 2020-03-16: 4 mg via INTRAVENOUS

## 2020-03-16 MED ORDER — SUGAMMADEX SODIUM 200 MG/2ML IV SOLN
INTRAVENOUS | Status: DC | PRN
  Administered 2020-03-16: 250 mg via INTRAVENOUS

## 2020-03-16 MED ORDER — PROPOFOL 10 MG/ML IV BOLUS
INTRAVENOUS | Status: DC | PRN
  Administered 2020-03-16: 150 mg via INTRAVENOUS
  Administered 2020-03-16: 50 mg via INTRAVENOUS

## 2020-03-16 MED ORDER — ROCURONIUM BROMIDE 10 MG/ML (PF) SYRINGE
PREFILLED_SYRINGE | INTRAVENOUS | Status: DC | PRN
  Administered 2020-03-16: 60 mg via INTRAVENOUS

## 2020-03-16 MED ORDER — CIPROFLOXACIN IN D5W 400 MG/200ML IV SOLN
INTRAVENOUS | Status: DC | PRN
  Administered 2020-03-16: 400 mg via INTRAVENOUS

## 2020-03-16 MED ORDER — LIDOCAINE 2% (20 MG/ML) 5 ML SYRINGE
INTRAMUSCULAR | Status: DC | PRN
  Administered 2020-03-16: 100 mg via INTRAVENOUS

## 2020-03-16 MED ORDER — FENTANYL CITRATE (PF) 100 MCG/2ML IJ SOLN
INTRAMUSCULAR | Status: AC
  Filled 2020-03-16: qty 2

## 2020-03-16 MED ORDER — SODIUM CHLORIDE 0.9 % IV SOLN: INTRAVENOUS | Status: DC

## 2020-03-16 MED ORDER — GLUCAGON HCL RDNA (DIAGNOSTIC) 1 MG IJ SOLR
INTRAMUSCULAR | Status: DC | PRN
  Administered 2020-03-16: .5 mg via INTRAVENOUS

## 2020-03-16 MED ORDER — FENTANYL CITRATE (PF) 100 MCG/2ML IJ SOLN: 25.0000 ug | INTRAMUSCULAR | Status: DC | PRN

## 2020-03-16 NOTE — Discharge Instructions (Signed)

## 2020-03-16 NOTE — Anesthesia Procedure Notes (Signed)
Procedure Name: Intubation Date/Time: 03/16/2020 8:35 AM Performed by: Sharlette Dense, CRNA Patient Re-evaluated:Patient Re-evaluated prior to induction Oxygen Delivery Method: Circle system utilized Preoxygenation: Pre-oxygenation with 100% oxygen Induction Type: IV induction Ventilation: Mask ventilation without difficulty Laryngoscope Size: Miller and 2 Grade View: Grade I Tube type: Oral Tube size: 7.0 mm Number of attempts: 1 Airway Equipment and Method: Stylet Placement Confirmation: positive ETCO2,  ETT inserted through vocal cords under direct vision and breath sounds checked- equal and bilateral Secured at: 22 cm Tube secured with: Tape Dental Injury: Teeth and Oropharynx as per pre-operative assessment

## 2020-03-16 NOTE — Anesthesia Preprocedure Evaluation (Signed)
Anesthesia Evaluation  Patient identified by MRN, date of birth, ID band Patient awake    Reviewed: Allergy & Precautions, NPO status , Patient's Chart, lab work & pertinent test results  History of Anesthesia Complications (+) PONV  Airway Mallampati: II  TM Distance: <3 FB Neck ROM: Full    Dental no notable dental hx.    Pulmonary sleep apnea ,    Pulmonary exam normal breath sounds clear to auscultation       Cardiovascular negative cardio ROS Normal cardiovascular exam Rhythm:Regular Rate:Normal     Neuro/Psych negative neurological ROS  negative psych ROS   GI/Hepatic negative GI ROS, Neg liver ROS,   Endo/Other  diabetes, Insulin DependentHypothyroidism   Renal/GU negative Renal ROS  negative genitourinary   Musculoskeletal negative musculoskeletal ROS (+)   Abdominal   Peds negative pediatric ROS (+)  Hematology negative hematology ROS (+)   Anesthesia Other Findings   Reproductive/Obstetrics negative OB ROS                             Anesthesia Physical Anesthesia Plan  ASA: III  Anesthesia Plan: General   Post-op Pain Management:    Induction: Intravenous  PONV Risk Score and Plan: 4 or greater and Ondansetron, Dexamethasone, Midazolam and Treatment may vary due to age or medical condition  Airway Management Planned: Oral ETT  Additional Equipment:   Intra-op Plan:   Post-operative Plan: Extubation in OR  Informed Consent: I have reviewed the patients History and Physical, chart, labs and discussed the procedure including the risks, benefits and alternatives for the proposed anesthesia with the patient or authorized representative who has indicated his/her understanding and acceptance.     Dental advisory given  Plan Discussed with: CRNA and Surgeon  Anesthesia Plan Comments:         Anesthesia Quick Evaluation

## 2020-03-16 NOTE — Anesthesia Postprocedure Evaluation (Signed)
Anesthesia Post Note  Patient: Amyjo Mizrachi Applegarth  Procedure(s) Performed: ENDOSCOPIC RETROGRADE CHOLANGIOPANCREATOGRAPHY (ERCP) WITH PROPOFOL (N/A )     Patient location during evaluation: PACU Anesthesia Type: General Level of consciousness: awake and alert Pain management: pain level controlled Vital Signs Assessment: post-procedure vital signs reviewed and stable Respiratory status: spontaneous breathing, nonlabored ventilation, respiratory function stable and patient connected to nasal cannula oxygen Cardiovascular status: blood pressure returned to baseline and stable Postop Assessment: no apparent nausea or vomiting Anesthetic complications: no   No complications documented.  Last Vitals:  Vitals:   03/16/20 1020 03/16/20 1030  BP: 135/73 (!) 151/80  Pulse: (!) 111 (!) 103  Resp: 18 20  Temp:    SpO2: 92% 94%    Last Pain:  Vitals:   03/16/20 1030  TempSrc:   PainSc: 0-No pain                 Braxdon Gappa S

## 2020-03-16 NOTE — Brief Op Note (Signed)
03/16/2020  9:57 AM  PATIENT:  Rhonda Higgins  49 y.o. female  PRE-OPERATIVE DIAGNOSIS:  CBD Stones  POST-OPERATIVE DIAGNOSIS:  unable to locate ampulla  PROCEDURE:  Procedure(s): ENDOSCOPIC RETROGRADE CHOLANGIOPANCREATOGRAPHY (ERCP) WITH PROPOFOL (N/A)  SURGEON:  Surgeon(s) and Role:    [name], MD - Primary  PHYSICIAN ASSISTANT:   ASSISTANTS: Laurena Spies Ford,RN,Chris Chandler,Tech  ANESTHESIA:   MAC  EBL:  None  BLOOD ADMINISTERED:none  DRAINS: none   LOCAL MEDICATIONS USED:  NONE  SPECIMEN:  No Specimen  DISPOSITION OF SPECIMEN:  N/A  COUNTS:  YES  TOURNIQUET:  * No tourniquets in log *  DICTATION: .Dragon Dictation  PLAN OF CARE: Discharge to home after PACU  PATIENT DISPOSITION:  PACU - hemodynamically stable.   Delay start of Pharmacological VTE agent (>24hrs) due to surgical blood loss or risk of bleeding: not applicable

## 2020-03-16 NOTE — Progress Notes (Signed)
Attempted ERCP, unable to locate ampulla, opened Omnipaque but did not use.

## 2020-03-16 NOTE — Op Note (Signed)
[facility] Patient Name: Rhonda Higgins Procedure Date: 03/16/2020 MRN: [id number] Attending MD: [name] , MD Date of Birth: 05-Mar-1971 CSN: [id number] Age: 49 Admit Type: Outpatient Procedure:                ERCP Indications:              Common bile duct stone- 4 mm noted on MRCP, history                            of prior ERCP sphincterotomy and stone extraction                            in 08/2017 Providers:                [name], MD, Cleda Daub, RN, Clyde Lundborg,                            RN, Elspeth Cho Tech., Technician, Danley Danker, CRNA Referring MD:             [name], MD Medicines:                Monitored Anesthesia Care Complications:            No immediate complications. Estimated Blood Loss:     Estimated blood loss: none. Procedure:                Pre-Anesthesia Assessment:                           - Prior to the procedure, a History and Physical                            was performed, and patient medications and                            allergies were reviewed. The patient's tolerance of                            previous anesthesia was also reviewed. The risks                            and benefits of the procedure and the sedation                            options and risks were discussed with the patient.                            All questions were answered, and informed consent                            was obtained. Prior Anticoagulants: The patient has  taken no previous anticoagulant or antiplatelet                            agents. ASA Grade Assessment: III - A patient with                            severe systemic disease. After reviewing the risks                            and benefits, the patient was deemed in                            satisfactory condition to undergo the procedure.                           After obtaining informed consent,  the scope was                            passed under direct vision. Throughout the                            procedure, the patient's blood pressure, pulse, and                            oxygen saturations were monitored continuously. The                            TJF-Q180V ([id number]) Olympus Duodenoscope was                            introduced through the mouth, The procedure was                            aborted. The scope was not inserted. bile ducts                            Medications were duodenum. The ERCP was unusually                            difficult due to challenging cannulation. The                            patient tolerated the procedure well. Scope In: Scope Out: Findings:      The scout film was normal. The esophagus was successfully intubated       under direct vision. The scope was advanced to a normal major papilla in       the descending duodenum without detailed examination of the pharynx,       larynx and associated structures, and upper GI tract. The upper GI tract       was grossly normal.      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      Although, there was bile noted in the second portion of duodenum, the  ampulla could not be identified.      All the duodenal folds were individually lifted with the sphinctertome       to help visualize the ampulla, however, the ampullary area could not be       identified.      Since, there was copious amount of bile noted, lavage was performed at       the site of individual duodenal folds to identify the site of bile       spillage. Despite lavage, the ampullary area could not be identified.      The bile duct could not be cannulated. 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 Moderate Sedation:      Patient did not receive moderate sedation for this procedure, but       instead received monitored anesthesia care. Recommendation:           - Patient has a contact number available for                            emergencies. The signs and symptoms of potential                            delayed complications were discussed with the                            patient. Return to normal activities tomorrow.                            Written discharge instructions were provided to the                            patient.                           - Resume regular diet. Procedure Code(s):        --- Professional ---                           [phone], 52, Endoscopic retrograde                            cholangiopancreatography (ERCP); diagnostic,                            including collection of specimen(s) by brushing or                            washing, when performed (separate procedure) Diagnosis Code(s):        --- Professional ---  K80.50, Calculus of bile duct without cholangitis                            or cholecystitis without obstruction CPT copyright 2019 [employer]. All rights reserved. The codes documented in this report are preliminary and upon coder review may  be revised to meet current compliance requirements. [name], MD 03/16/2020 9:57:05 AM This report has been signed electronically. Number of Addenda: 0

## 2020-03-16 NOTE — Transfer of Care (Signed)
Immediate Anesthesia Transfer of Care Note  Patient: Rhonda Higgins  Procedure(s) Performed: ENDOSCOPIC RETROGRADE CHOLANGIOPANCREATOGRAPHY (ERCP) WITH PROPOFOL (N/A )  Patient Location: Endoscopy Unit  Anesthesia Type:General  Level of Consciousness: drowsy  Airway & Oxygen Therapy: Patient Spontanous Breathing and Patient connected to face mask oxygen  Post-op Assessment: Report given to RN and Post -op Vital signs reviewed and stable  Post vital signs: Reviewed and stable  Last Vitals:  Vitals Value Taken Time  BP    Temp    Pulse 117 03/16/20 1002  Resp 27 03/16/20 1002  SpO2 98 % 03/16/20 1002  Vitals shown include unvalidated device data.  Last Pain:  Vitals:   03/16/20 0731  TempSrc: Oral         Complications: No complications documented.

## 2020-03-18 ENCOUNTER — Encounter (HOSPITAL_COMMUNITY): Payer: Self-pay | Admitting: Gastroenterology

## 2020-03-31 ENCOUNTER — Encounter (HOSPITAL_COMMUNITY): Payer: Self-pay | Admitting: Emergency Medicine

## 2020-03-31 ENCOUNTER — Encounter (HOSPITAL_COMMUNITY): Payer: Self-pay

## 2020-03-31 ENCOUNTER — Emergency Department (HOSPITAL_COMMUNITY)
Admission: EM | Admit: 2020-03-31 | Discharge: 2020-03-31 | Disposition: A | Discharge status: Home / Self Care | Payer: Medicaid Other | Attending: Emergency Medicine | Admitting: Emergency Medicine

## 2020-03-31 ENCOUNTER — Ambulatory Visit (HOSPITAL_COMMUNITY)
Admission: EM | Admit: 2020-03-31 | Discharge: 2020-04-01 | Disposition: A | Discharge status: Other Acute Inpatient Hospital | Payer: Medicaid Other | Source: Home / Self Care

## 2020-03-31 ENCOUNTER — Other Ambulatory Visit: Payer: Self-pay

## 2020-03-31 DIAGNOSIS — Z79899 Other long term (current) drug therapy: Secondary | ICD-10-CM | POA: Insufficient documentation

## 2020-03-31 DIAGNOSIS — E118 Type 2 diabetes mellitus with unspecified complications: Secondary | ICD-10-CM | POA: Insufficient documentation

## 2020-03-31 DIAGNOSIS — Z8542 Personal history of malignant neoplasm of other parts of uterus: Secondary | ICD-10-CM | POA: Insufficient documentation

## 2020-03-31 DIAGNOSIS — G4733 Obstructive sleep apnea (adult) (pediatric): Secondary | ICD-10-CM | POA: Insufficient documentation

## 2020-03-31 DIAGNOSIS — E039 Hypothyroidism, unspecified: Secondary | ICD-10-CM | POA: Insufficient documentation

## 2020-03-31 DIAGNOSIS — Z79890 Hormone replacement therapy (postmenopausal): Secondary | ICD-10-CM | POA: Diagnosis not present

## 2020-03-31 DIAGNOSIS — Z7901 Long term (current) use of anticoagulants: Secondary | ICD-10-CM | POA: Insufficient documentation

## 2020-03-31 DIAGNOSIS — E785 Hyperlipidemia, unspecified: Secondary | ICD-10-CM | POA: Insufficient documentation

## 2020-03-31 DIAGNOSIS — E119 Type 2 diabetes mellitus without complications: Secondary | ICD-10-CM | POA: Insufficient documentation

## 2020-03-31 DIAGNOSIS — G4700 Insomnia, unspecified: Secondary | ICD-10-CM | POA: Insufficient documentation

## 2020-03-31 DIAGNOSIS — Z20822 Contact with and (suspected) exposure to covid-19: Secondary | ICD-10-CM | POA: Diagnosis not present

## 2020-03-31 DIAGNOSIS — Z9119 Patient's noncompliance with other medical treatment and regimen: Secondary | ICD-10-CM | POA: Insufficient documentation

## 2020-03-31 DIAGNOSIS — Z794 Long term (current) use of insulin: Secondary | ICD-10-CM | POA: Insufficient documentation

## 2020-03-31 DIAGNOSIS — K219 Gastro-esophageal reflux disease without esophagitis: Secondary | ICD-10-CM | POA: Insufficient documentation

## 2020-03-31 DIAGNOSIS — Z7984 Long term (current) use of oral hypoglycemic drugs: Secondary | ICD-10-CM | POA: Diagnosis not present

## 2020-03-31 DIAGNOSIS — F32A Depression, unspecified: Secondary | ICD-10-CM | POA: Insufficient documentation

## 2020-03-31 DIAGNOSIS — F419 Anxiety disorder, unspecified: Secondary | ICD-10-CM | POA: Insufficient documentation

## 2020-03-31 DIAGNOSIS — R450 Nervousness: Secondary | ICD-10-CM | POA: Insufficient documentation

## 2020-03-31 DIAGNOSIS — F329 Major depressive disorder, single episode, unspecified: Secondary | ICD-10-CM | POA: Insufficient documentation

## 2020-03-31 DIAGNOSIS — Z9151 Personal history of suicidal behavior: Secondary | ICD-10-CM | POA: Insufficient documentation

## 2020-03-31 DIAGNOSIS — F603 Borderline personality disorder: Secondary | ICD-10-CM

## 2020-03-31 DIAGNOSIS — R45851 Suicidal ideations: Secondary | ICD-10-CM | POA: Insufficient documentation

## 2020-03-31 LAB — LIPID PANEL
Cholesterol: 90 mg/dL (ref 0–200)
HDL: 26 mg/dL — ABNORMAL LOW (ref >40)
LDL Cholesterol: 34 mg/dL (ref 0–99)
Total CHOL/HDL Ratio: 3.5 RATIO
Triglycerides: 149 mg/dL (ref <150)
VLDL: 30 mg/dL (ref 0–40)

## 2020-03-31 LAB — CBC
HCT: 38.6 % (ref 36.0–46.0)
Hemoglobin: 12.1 g/dL (ref 12.0–15.0)
MCH: 26.1 pg (ref 26.0–34.0)
MCHC: 31.3 g/dL (ref 30.0–36.0)
MCV: 83.4 fL (ref 80.0–100.0)
Platelets: 109 10*3/uL — ABNORMAL LOW (ref 150–400)
RBC: 4.63 MIL/uL (ref 3.87–5.11)
RDW: 15.3 % (ref 11.5–15.5)
WBC: 4.5 10*3/uL (ref 4.0–10.5)
nRBC: 0 % (ref 0.0–0.2)

## 2020-03-31 LAB — COMPREHENSIVE METABOLIC PANEL
ALT: 31 U/L (ref 0–44)
AST: 38 U/L (ref 15–41)
Albumin: 4 g/dL (ref 3.5–5.0)
Alkaline Phosphatase: 83 U/L (ref 38–126)
Anion gap: 12 (ref 5–15)
BUN: 10 mg/dL (ref 6–20)
CO2: 23 mmol/L (ref 22–32)
Calcium: 8.2 mg/dL — ABNORMAL LOW (ref 8.9–10.3)
Chloride: 108 mmol/L (ref 98–111)
Creatinine, Ser: 0.66 mg/dL (ref 0.44–1.00)
GFR, Estimated: >60 mL/min (ref >60)
Glucose, Bld: 133 mg/dL — ABNORMAL HIGH (ref 70–99)
Potassium: 3.4 mmol/L — ABNORMAL LOW (ref 3.5–5.1)
Sodium: 143 mmol/L (ref 135–145)
Total Bilirubin: 0.8 mg/dL (ref 0.3–1.2)
Total Protein: 6.6 g/dL (ref 6.5–8.1)

## 2020-03-31 LAB — URINALYSIS, ROUTINE W REFLEX MICROSCOPIC
Bilirubin Urine: NEGATIVE
Glucose, UA: NEGATIVE mg/dL
Ketones, ur: NEGATIVE mg/dL
Leukocytes,Ua: NEGATIVE
Nitrite: NEGATIVE
Protein, ur: NEGATIVE mg/dL
RBC / HPF: >50 RBC/hpf — ABNORMAL HIGH (ref 0–5)
Specific Gravity, Urine: 1.018 (ref 1.005–1.030)
pH: 5 (ref 5.0–8.0)

## 2020-03-31 LAB — ETHANOL: Alcohol, Ethyl (B): <10 mg/dL (ref <10)

## 2020-03-31 LAB — TSH: TSH: 0.436 u[IU]/mL (ref 0.350–4.500)

## 2020-03-31 LAB — RESPIRATORY PANEL BY RT PCR (FLU A&B, COVID)
Influenza A by PCR: NEGATIVE
Influenza B by PCR: NEGATIVE
SARS Coronavirus 2 by RT PCR: NEGATIVE

## 2020-03-31 LAB — RAPID URINE DRUG SCREEN, HOSP PERFORMED
Amphetamines: NOT DETECTED
Barbiturates: NOT DETECTED
Benzodiazepines: NOT DETECTED
Cocaine: NOT DETECTED
Opiates: NOT DETECTED
Tetrahydrocannabinol: NOT DETECTED

## 2020-03-31 LAB — I-STAT BETA HCG BLOOD, ED (MC, WL, AP ONLY): I-stat hCG, quantitative: <5 m[IU]/mL (ref <5)

## 2020-03-31 LAB — GLUCOSE, CAPILLARY: Glucose-Capillary: 127 mg/dL — ABNORMAL HIGH (ref 70–99)

## 2020-03-31 LAB — ACETAMINOPHEN LEVEL: Acetaminophen (Tylenol), Serum: <10 ug/mL — ABNORMAL LOW (ref 10–30)

## 2020-03-31 LAB — SALICYLATE LEVEL: Salicylate Lvl: <7 mg/dL — ABNORMAL LOW (ref 7.0–30.0)

## 2020-03-31 MED ORDER — GABAPENTIN 400 MG PO CAPS: 1200.0000 mg | ORAL_CAPSULE | Freq: Every day | ORAL | Status: DC

## 2020-03-31 MED ORDER — RISPERIDONE 2 MG PO TABS: 2.0000 mg | ORAL_TABLET | Freq: Every day | ORAL | Status: DC

## 2020-03-31 MED ORDER — TRAZODONE HCL 50 MG PO TABS: 50.0000 mg | ORAL_TABLET | Freq: Every evening | ORAL | Status: DC | PRN

## 2020-03-31 MED ORDER — MAGNESIUM HYDROXIDE 400 MG/5ML PO SUSP: 30.0000 mL | Freq: Every day | ORAL | Status: DC | PRN

## 2020-03-31 MED ORDER — LIRAGLUTIDE 18 MG/3ML ~~LOC~~ SOPN: 1.8000 mg | PEN_INJECTOR | Freq: Every day | SUBCUTANEOUS | Status: DC

## 2020-03-31 MED ORDER — INSULIN GLARGINE 100 UNIT/ML ~~LOC~~ SOLN
30.0000 [IU] | Freq: Two times a day (BID) | SUBCUTANEOUS | Status: DC
  Administered 2020-04-01: 30 [IU] via SUBCUTANEOUS

## 2020-03-31 MED ORDER — SITAGLIPTIN PHOS-METFORMIN HCL 50-1000 MG PO TABS: 1.0000 | ORAL_TABLET | Freq: Two times a day (BID) | ORAL | Status: DC

## 2020-03-31 MED ORDER — METFORMIN HCL 500 MG PO TABS
1000.0000 mg | ORAL_TABLET | Freq: Two times a day (BID) | ORAL | Status: DC
  Administered 2020-04-01: 1000 mg via ORAL
  Filled 2020-03-31: qty 2

## 2020-03-31 MED ORDER — HYDROXYZINE HCL 25 MG PO TABS: 25.0000 mg | ORAL_TABLET | Freq: Three times a day (TID) | ORAL | Status: DC | PRN

## 2020-03-31 MED ORDER — ALUM & MAG HYDROXIDE-SIMETH 200-200-20 MG/5ML PO SUSP: 30.0000 mL | ORAL | Status: DC | PRN

## 2020-03-31 MED ORDER — GLIMEPIRIDE 2 MG PO TABS
4.0000 mg | ORAL_TABLET | Freq: Two times a day (BID) | ORAL | Status: DC
  Administered 2020-04-01: 4 mg via ORAL
  Filled 2020-03-31: qty 2

## 2020-03-31 MED ORDER — ATORVASTATIN CALCIUM 40 MG PO TABS: 40.0000 mg | ORAL_TABLET | Freq: Every evening | ORAL | Status: DC

## 2020-03-31 MED ORDER — GABAPENTIN 400 MG PO CAPS
400.0000 mg | ORAL_CAPSULE | Freq: Two times a day (BID) | ORAL | Status: DC
  Administered 2020-04-01: 400 mg via ORAL
  Filled 2020-03-31: qty 1

## 2020-03-31 MED ORDER — LINAGLIPTIN 5 MG PO TABS
5.0000 mg | ORAL_TABLET | Freq: Every day | ORAL | Status: DC
  Administered 2020-04-01: 5 mg via ORAL
  Filled 2020-03-31: qty 1

## 2020-03-31 MED ORDER — LEVOTHYROXINE SODIUM 75 MCG PO TABS
150.0000 ug | ORAL_TABLET | Freq: Every day | ORAL | Status: DC
  Administered 2020-04-01: 150 ug via ORAL
  Filled 2020-03-31: qty 2

## 2020-03-31 MED ORDER — LISINOPRIL 5 MG PO TABS: 5.0000 mg | ORAL_TABLET | Freq: Every day | ORAL | Status: DC

## 2020-03-31 MED ORDER — BUPROPION HCL ER (XL) 150 MG PO TB24
150.0000 mg | ORAL_TABLET | Freq: Every day | ORAL | Status: DC
  Administered 2020-04-01: 150 mg via ORAL
  Filled 2020-03-31: qty 1

## 2020-03-31 MED ORDER — ACETAMINOPHEN 325 MG PO TABS: 650.0000 mg | ORAL_TABLET | Freq: Four times a day (QID) | ORAL | Status: DC | PRN

## 2020-03-31 NOTE — ED Notes (Signed)
CBG 127, PT STATES WHEN HER BLOOD SUGAR IS THAT LOW SHE DOES NOT TAKE HER LANTUS INSULIN AT BEDTIME.  INSULIN HELD.

## 2020-03-31 NOTE — ED Notes (Addendum)
Patient was admitted voluntarily to [facility] with SI, without a plan, anxiety and depression.  Patient stated that she's been "depressed" and "tired of living" for "about a week".  Stated that she recently moved with her wife into her wife's childhood home, which seems to have precipitated her SI, although she can't state why.  Also reported that her mother-in-law passed away on Mar 30, 2020 and her father-in-law died 01-02-2019.  Patient stated she had a close relationship with both.  Further, patient reported that her elderly dog passed away 4 months ago.  Patient currently denies SI, HI, and AVH.  Rated anxiety 9/10 and depression 10/10.  Patient stated that she does receive OP counseling through [facility] and medication management through [facility].  Patient stated that it was her counselor who referred her here today.  Patient endorsed having had previous suicide attempts "20 years ago", but none since.  Patient stated that, in the past, she tried jumping off a bridge, cutting her wrists, and taking pills.  Stated that she was stopped each time "by my wife, who was my girlfriend then."  Patient stated that her wife is very supportive.  "I don't know why it's not working now."  Patient oriented to the unit and offered food and fluids.

## 2020-03-31 NOTE — ED Triage Notes (Signed)
Patient complains of SI x1 week, states she does not have a specific plan. Her and her wife have been involved in a move, recent passing of family members and she might get some of her social security docked. Calm and cooperative, but tearful at this time.

## 2020-03-31 NOTE — ED Provider Notes (Signed)
Behavioral Health Admission [facility])  Date: 04/01/20 Patient Name: Rhonda Higgins MRN: [id number] Chief Complaint:  Chief Complaint  Patient presents with  . Anxiety  . Depression  . Suicidal      Diagnoses:  Final diagnoses:  Borderline personality disorder in adult [facility])  Suicidal ideation    HPI: Rhonda Higgins is a a 49 y.o. female with a history of Borderline Personality Disorder, depression, DM, and endometrial cancer who presented to the emergency department with worsening depression and suicidal thoughts without a specific plan. She was transferred to [facility] for continuous assessment.   On evaluation patient is alert and oriented x 4, pleasant, and cooperative. She reports that she had been doing well until her mother-in-law passed away recently. She states that her wife quit her job a while back so that she could take care of her parents. She states that now that both of wife's parents are deceased that they will be moving into the house soon. She identifies these events as triggers for depression and SI. She reports that she has a history of multiple suicide attempts, she states that most occurred when she was younger. She states that she is followed by [facility] for medication management and [facility] for therapy. States that she is prescribed Risperdal, bupropion, Lamictal, and gabapentin. States that she lost her Lamictal last month and has not taken it since then. She does not feel it was helpful and it not interested in resuming the lamictal.   Patient's speech is clear and coherent. Mood is depressed and affect is congruent with mood. Thought process is coherent and thought content is logical. Denies auditory and visual hallucinations. No indication that patient is responding to internal stimuli. No evidence of delusional thought content. Denies current suicidal ideations and states that beng around others is helpful. However, she is not able to  contract for safety if discharged. Denies homicidal ideations.    PHQ 2-9:     ED from 03/31/2020 in [place] CATEGORY Low Risk       Total Time spent with patient: 20 minutes  Musculoskeletal  Strength & Muscle Tone: within normal limits Gait & Station: normal Patient leans: N/A  Psychiatric Specialty Exam  Presentation General Appearance: Fairly Groomed  Eye Contact:Fair  Speech:Clear and Coherent;Normal Rate  Speech Volume:Decreased  Handedness:Right   Mood and Affect  Mood:Anxious;Depressed;Hopeless;Worthless  Affect:Blunt   Social worker Processes:Coherent;Linear  Descriptions of Associations:Intact  Orientation:Full (Time, Place and Person)  Thought Content:WDL  Hallucinations:Hallucinations: None  Ideas of Reference:None  Suicidal Thoughts:Suicidal Thoughts: Yes, Active SI Active Intent and/or Plan: With Intent;With Plan;With Means to Carry Out  Homicidal Thoughts:Homicidal Thoughts: No   Sensorium  Memory:Immediate Good;Recent Good;Remote Good  Judgment:Intact  Insight:Lacking   Executive Functions  Concentration:Fair  Attention Span:Fair  [place] of Knowledge:Good  Language:Good   Psychomotor Activity  Psychomotor Activity:Psychomotor Activity: Normal   Assets  Assets:Communication Skills;Desire for Improvement;Financial Resources/Insurance;Housing;Intimacy;Resilience   Sleep  Sleep:Sleep: Fair   Physical Exam Constitutional:      General: She is not in acute distress.    Appearance: She is not ill-appearing, toxic-appearing or diaphoretic.  HENT:     Head: Normocephalic.     Right Ear: External ear normal.     Left Ear: External ear normal.  Eyes:     Conjunctiva/sclera: Conjunctivae normal.     Pupils: Pupils are equal, round, and reactive to light.  Cardiovascular:  Rate and Rhythm: Normal rate.  Pulmonary:     Effort: Pulmonary effort is  normal. No respiratory distress.  Musculoskeletal:        General: Normal range of motion.  Skin:    General: Skin is warm and dry.  Neurological:     Mental Status: She is alert and oriented to person, place, and time.  Psychiatric:        Mood and Affect: Mood is anxious and depressed.        Thought Content: Thought content is not paranoid or delusional. Thought content does not include homicidal or suicidal ideation. Thought content does not include suicidal plan.    Review of Systems  Constitutional: Negative for chills, diaphoresis, fever, malaise/fatigue and weight loss.  HENT: Negative for congestion.   Respiratory: Negative for cough and shortness of breath.   Cardiovascular: Negative for chest pain and palpitations.  Gastrointestinal: Negative for diarrhea, nausea and vomiting.  Neurological: Negative for dizziness.  Psychiatric/Behavioral: Positive for depression and suicidal ideas. Negative for hallucinations, memory loss and substance abuse. The patient is nervous/anxious and has insomnia.   All other systems reviewed and are negative.   Blood pressure 125/82, pulse 76, temperature 98.7 F (37.1 C), temperature source Oral, resp. rate 18, SpO2 97 %. There is no height or weight on file to calculate BMI.  Past Psychiatric History: Borderline Personality Disorder, MDD, multiple suicide attempts  Is the patient at risk to self? Yes  Has the patient been a risk to self in the past 6 months? No .    Has the patient been a risk to self within the distant past? Yes   Is the patient a risk to others? No   Has the patient been a risk to others in the past 6 months? No   Has the patient been a risk to others within the distant past? No   Past Medical History:  Past Medical History:  Diagnosis Date  . Adenocarcinoma (HCC)    endometrial, FIGO GRADE 1  . Allergic rhinitis   . Atypical chest pain    History of  . Depression   . Elevated liver enzymes   . GERD  (gastroesophageal reflux disease)   . Hematuria   . History of endometrial cancer 08-02-2009   oncologist-  dr brewster/ Denman George and dr kinard/  no recurrence   endometrial adenocarinoma Stage 1B, Grade 1, FIGO--  s/p  TAH w/ BSO and pelvic lymph node dissection's and radiation therapy  . History of kidney stones   . History of radiation therapy    2011  pelvic intracavity brachytherapy treatment's for endometrial carcinoma  . History of thyroid nodule    multinodular goiter s/p  total thyroidectomy 11-19-2015  per pathology -  adenomatoid nodules  . Hyperlipidemia   . Hypothyroidism, postsurgical   . Insulin dependent diabetes mellitus    Type 2  . Left ureteral stone   . Obesity   . OSA (obstructive sleep apnea)    severe OSA  per study 03-08-2010--  noncomplant cpap (no more sleep apnes since lost weight 2019)  . Overactive bladder   . Personality disorder ([place])   . Polyphagia(783.6)   . PONV (postoperative nausea and vomiting)    after ear surgery only one time  . Right lower quadrant pain   . Urgency of urination   . UTI (urinary tract infection)     Past Surgical History:  Procedure Laterality Date  . CARDIOVASCULAR STRESS TEST  06/09/2008  normal nuclear study w/ no ischemia/  normal LV function and wall motion , ef 83%  . CHOLECYSTECTOMY N/A 09/06/2017   Procedure: LAPAROSCOPIC CHOLECYSTECTOMY WITH INTRAOPERATIVE CHOLANGIOGRAM;  Surgeon: Armandina Gemma, MD;  Location: [facility];  Service: General;  Laterality: N/A;  . CYSTOSCOPY/RETROGRADE/URETEROSCOPY/STONE EXTRACTION WITH BASKET Left 03/08/2016   Procedure: CYSTOSCOPY/RETROGRADE/URETEROSCOPY/STONE EXTRACTION WITH BASKET, STENT PLACEMENT;  Surgeon: Rana Snare, MD;  Location[facility];  Service: Urology;  Laterality: Left;  . ENDOMETRIAL BIOPSY    . ERCP N/A 09/07/2017   Procedure: ENDOSCOPIC RETROGRADE CHOLANGIOPANCREATOGRAPHY (ERCP);  Surgeon: [name], MD;  Location: [facility];  Service:  Gastroenterology;  Laterality: N/A;  . ESOPHAGOGASTRODUODENOSCOPY (EGD) WITH PROPOFOL N/A 03/16/2020   Procedure: ESOPHAGOGASTRODUODENOSCOPY (EGD) WITH PROPOFOL;  Surgeon: [name], MD;  Location: [facility];  Service: Gastroenterology;  Laterality: N/A;  unable to locate ampulla, aborted ERCP, changed to EGD  . HOLMIUM LASER APPLICATION Left 12/27/6267   Procedure: HOLMIUM LASER APPLICATION;  Surgeon: Rana Snare, MD;  Location: [facility];  Service: Urology;  Laterality: Left;  . MOUTH SURGERY    . MYRINGECOTMY W/ REMOVAL MIDDLE EAR CHOLESTEATOMA TYPE 1 FASICA TYMPANOPLASTY  09/13/2000  . ROBOTIC ASSISTED TOTAL HYSTERECTOMY WITH BILATERAL SALPINGO OOPHERECTOMY  08-02-2009   at [facility]  dr Denman George   w/  Bilateral pelvic and para aortic lymph node dissection's  . THYROIDECTOMY N/A 11/19/2015   Procedure: TOTAL THYROIDECTOMY;  Surgeon: Armandina Gemma, MD;  Location: [facility];  Service: General;  Laterality: N/A;  . TONSILLECTOMY  age 49  . TRANSTHORACIC ECHOCARDIOGRAM  07/19/2014   ef 55-60%/  trivial TR  . TYMPANOPLASTY Right 1993    Family History:  Family History  Problem Relation Age of Onset  . Heart disease Sister   . Heart attack Brother   . Heart disease Brother   . Heart disease Sister   . Diabetes Sister   . Breast cancer Sister   . Asthma Mother   . Heart disease Mother   . Diabetes Mother   . Emphysema Mother   . Hypertension Mother   . Stroke Mother   . Prostate cancer Father     Social History:  Social History   Socioeconomic History  . Marital status: Married    Spouse name: Not on file  . Number of children: N  . Years of education: Not on file  . Highest education level: Not on file  Occupational History  . Occupation: n/a  Tobacco Use  . Smoking status: Never Smoker  . Smokeless tobacco: Never Used  Vaping Use  . Vaping Use: Never used  Substance and Sexual Activity  . Alcohol use: No  . Drug use: No  . Sexual activity: Yes    Birth  control/protection: None  Other Topics Concern  . Not on file  Social History Narrative   Lives with partner, [name].   Social Determinants of Health   Financial Resource Strain:   . Difficulty of Paying Living Expenses: Not on file  Food Insecurity:   . Worried About Charity fundraiser in the Last Year: Not on file  . Ran Out of Food in the Last Year: Not on file  Transportation Needs:   . Lack of Transportation (Medical): Not on file  . Lack of Transportation (Non-Medical): Not on file  Physical Activity:   . Days of Exercise per Week: Not on file  . Minutes of Exercise per Session: Not on file  Stress:   . Feeling of  Stress : Not on file  Social Connections:   . Frequency of Communication with Friends and Family: Not on file  . Frequency of Social Gatherings with Friends and Family: Not on file  . Attends Religious Services: Not on file  . Active Member of Clubs or Organizations: Not on file  . Attends Archivist Meetings: Not on file  . Marital Status: Not on file  Intimate Partner Violence:   . Fear of Current or Ex-Partner: Not on file  . Emotionally Abused: Not on file  . Physically Abused: Not on file  . Sexually Abused: Not on file    SDOH:  SDOH Screenings   Alcohol Screen:   . Last Alcohol Screening Score (AUDIT): Not on file  Depression (PHQ2-9):   . PHQ-2 Score: Not on file  Financial Resource Strain:   . Difficulty of Paying Living Expenses: Not on file  Food Insecurity:   . Worried About Charity fundraiser in the Last Year: Not on file  . Ran Out of Food in the Last Year: Not on file  Housing:   . Last Housing Risk Score: Not on file  Physical Activity:   . Days of Exercise per Week: Not on file  . Minutes of Exercise per Session: Not on file  Social Connections:   . Frequency of Communication with Friends and Family: Not on file  . Frequency of Social Gatherings with Friends and Family: Not on file  . Attends Religious Services:  Not on file  . Active Member of Clubs or Organizations: Not on file  . Attends Archivist Meetings: Not on file  . Marital Status: Not on file  Stress:   . Feeling of Stress : Not on file  Tobacco Use: Low Risk   . Smoking Tobacco Use: Never Smoker  . Smokeless Tobacco Use: Never Used  Transportation Needs:   . Film/video editor (Medical): Not on file  . Lack of Transportation (Non-Medical): Not on file    Last Labs:  Admission on 03/31/2020  Component Date Value Ref Range Status  . Glucose-Capillary 03/31/2020 127* 70 - 99 mg/dL Final   Glucose reference range applies only to samples taken after fasting for at least 8 hours.  Admission on 03/31/2020, Discharged on 03/31/2020  Component Date Value Ref Range Status  . Sodium 03/31/2020 143  135 - 145 mmol/L Final  . Potassium 03/31/2020 3.4* 3.5 - 5.1 mmol/L Final  . Chloride 03/31/2020 108  98 - 111 mmol/L Final  . CO2 03/31/2020 23  22 - 32 mmol/L Final  . Glucose, Bld 03/31/2020 133* 70 - 99 mg/dL Final   Glucose reference range applies only to samples taken after fasting for at least 8 hours.  . BUN 03/31/2020 10  6 - 20 mg/dL Final  . Creatinine, Ser 03/31/2020 0.66  0.44 - 1.00 mg/dL Final  . Calcium 03/31/2020 8.2* 8.9 - 10.3 mg/dL Final  . Total Protein 03/31/2020 6.6  6.5 - 8.1 g/dL Final  . Albumin 03/31/2020 4.0  3.5 - 5.0 g/dL Final  . AST 03/31/2020 38  15 - 41 U/L Final  . ALT 03/31/2020 31  0 - 44 U/L Final  . Alkaline Phosphatase 03/31/2020 83  38 - 126 U/L Final  . Total Bilirubin 03/31/2020 0.8  0.3 - 1.2 mg/dL Final  . GFR, Estimated 03/31/2020 >60  >60 mL/min Final  . Anion gap 03/31/2020 12  5 - 15 Final   Performed at [facility], [place] [address] [place], [place] [address]  . Alcohol, Ethyl (B) 03/31/2020 <10  <10 mg/dL Final   Comment: (NOTE) Lowest detectable limit for serum alcohol is 10 mg/dL.  For medical purposes only. Performed at [facility], [place] [address] [place], [place] [address]   . Salicylate Lvl 51/76/1607 <7.0* 7.0 - 30.0 mg/dL Final   Performed at [facility] [address] [place], [place] [address]  . Acetaminophen (Tylenol), Serum 03/31/2020 <10* 10 - 30 ug/mL Final   Comment: (NOTE) Therapeutic concentrations vary significantly. A range of 10-30 ug/mL  may be an effective concentration for many patients. However, some  are best treated at concentrations outside of this range. Acetaminophen concentrations >150 ug/mL at 4 hours after ingestion  and >50 ug/mL at 12 hours after ingestion are often associated with  toxic reactions.  Performed at [facility], [place] [address] [place], [place] [address]   . WBC 03/31/2020 4.5  4.0 - 10.5 K/uL Final  . RBC 03/31/2020 4.63  3.87 - 5.11 MIL/uL Final  . Hemoglobin 03/31/2020 12.1  12.0 - 15.0 g/dL Final  . HCT 03/31/2020 38.6  36 - 46 % Final  . MCV 03/31/2020 83.4  80.0 - 100.0 fL Final  . MCH 03/31/2020 26.1  26.0 - 34.0 pg Final  . MCHC 03/31/2020 31.3  30.0 - 36.0 g/dL Final  . RDW 03/31/2020 15.3  11.5 - 15.5 % Final  . Platelets 03/31/2020 109* 150 - 400 K/uL Final   Comment: REPEATED TO VERIFY PLATELET COUNT CONFIRMED BY SMEAR SPECIMEN CHECKED FOR CLOTS Immature Platelet Fraction may be clinically indicated, consider ordering this additional test [id number]   . nRBC 03/31/2020 0.0  0.0 - 0.2 % Final   Performed at [facility] [address], [place], [place] [address]  . Opiates 03/31/2020 NONE DETECTED  NONE DETECTED Final  . Cocaine 03/31/2020 NONE DETECTED  NONE DETECTED Final  . Benzodiazepines 03/31/2020 NONE DETECTED  NONE DETECTED Final  . Amphetamines 03/31/2020 NONE DETECTED  NONE DETECTED Final  . Tetrahydrocannabinol 03/31/2020 NONE DETECTED  NONE DETECTED Final  . Barbiturates 03/31/2020 NONE DETECTED  NONE DETECTED Final   Comment: (NOTE) DRUG SCREEN FOR MEDICAL  PURPOSES ONLY.  IF CONFIRMATION IS NEEDED FOR ANY PURPOSE, NOTIFY LAB WITHIN 5 DAYS.  LOWEST DETECTABLE LIMITS FOR URINE DRUG SCREEN Drug Class                     Cutoff (ng/mL) Amphetamine and metabolites    1000 Barbiturate and metabolites    200 Benzodiazepine                 182 Tricyclics and metabolites     300 Opiates and metabolites        300 Cocaine and metabolites        300 THC                            50 Performed at [facility], [place] [address], [place], [place] [address]   . I-stat hCG, quantitative 03/31/2020 <5.0  <5 mIU/mL Final  . Comment 3 03/31/2020          Final   Comment:   GEST. AGE      CONC.  (mIU/mL)   <=1 WEEK        5 - 50     2 WEEKS  50 - 500     3 WEEKS       100 - 10,000     4 WEEKS     1,000 - 30,000        FEMALE AND NON-PREGNANT FEMALE:     LESS THAN 5 mIU/mL   . TSH 03/31/2020 0.436  0.350 - 4.500 uIU/mL Final   Comment: Performed by a 3rd Generation assay with a functional sensitivity of <=0.01 uIU/mL. Performed at [facility], [place] [address] [place], [place] [address]   . Cholesterol 03/31/2020 90  0 - 200 mg/dL Final  . Triglycerides 03/31/2020 149  <150 mg/dL Final  . HDL 03/31/2020 26* >40 mg/dL Final  . Total CHOL/HDL Ratio 03/31/2020 3.5  RATIO Final  . VLDL 03/31/2020 30  0 - 40 mg/dL Final  . LDL Cholesterol 03/31/2020 34  0 - 99 mg/dL Final   Comment:        Total Cholesterol/HDL:CHD Risk Coronary Heart Disease Risk Table                     Men   Women  1/2 Average Risk   3.4   3.3  Average Risk       5.0   4.4  2 X Average Risk   9.6   7.1  3 X Average Risk  23.4   11.0        Use the calculated Patient Ratio above and the CHD Risk Table to determine the patient's CHD Risk.        ATP III CLASSIFICATION (LDL):  <100     mg/dL   Optimal  100-129  mg/dL   Near or Above                    Optimal  130-159  mg/dL   Borderline  160-189  mg/dL   High  >190     mg/dL    Very High Performed at [place] [address] [place], [place] [address]   . Color, Urine 03/31/2020 YELLOW  YELLOW Final  . APPearance 03/31/2020 CLEAR  CLEAR Final  . Specific Gravity, Urine 03/31/2020 1.018  1.005 - 1.030 Final  . pH 03/31/2020 5.0  5.0 - 8.0 Final  . Glucose, UA 03/31/2020 NEGATIVE  NEGATIVE mg/dL Final  . Hgb urine dipstick 03/31/2020 MODERATE* NEGATIVE Final  . Bilirubin Urine 03/31/2020 NEGATIVE  NEGATIVE Final  . Ketones, ur 03/31/2020 NEGATIVE  NEGATIVE mg/dL Final  . Protein, ur 03/31/2020 NEGATIVE  NEGATIVE mg/dL Final  . Nitrite 03/31/2020 NEGATIVE  NEGATIVE Final  . Chalmers Guest 03/31/2020 NEGATIVE  NEGATIVE Final  . RBC / HPF 03/31/2020 >50* 0 - 5 RBC/hpf Final  . WBC, UA 03/31/2020 0-5  0 - 5 WBC/hpf Final  . Bacteria, UA 03/31/2020 FEW* NONE SEEN Final  . Squamous Epithelial / LPF 03/31/2020 0-5  0 - 5 Final  . Mucus 03/31/2020 PRESENT   Final   Performed at [facility], [place] [address], [place], [place] [address]  . SARS Coronavirus 2 by RT PCR 03/31/2020 NEGATIVE  NEGATIVE Final   Comment: (NOTE) SARS-CoV-2 target nucleic acids are NOT DETECTED.  The SARS-CoV-2 RNA is generally detectable in upper respiratoy specimens during the acute phase of infection. The lowest concentration of SARS-CoV-2 viral copies this assay can detect is 131 copies/mL. A negative result does not preclude SARS-Cov-2 infection and should not be used as the sole basis for treatment or  other patient management decisions. A negative result may occur with  improper specimen collection/handling, submission of specimen other than nasopharyngeal swab, presence of viral mutation(s) within the areas targeted by this assay, and inadequate number of viral copies (<131 copies/mL). A negative result must be combined with clinical observations, patient history, and epidemiological information. The expected result is Negative.  Fact Sheet  for Patients:  PinkCheek.be  Fact Sheet for Healthcare Providers:  GravelBags.it  This test is no                          t yet approved or cleared by the [place] FDA and  has been authorized for detection and/or diagnosis of SARS-CoV-2 by FDA under an Emergency Use Authorization (EUA). This EUA will remain  in effect (meaning this test can be used) for the duration of the COVID-19 declaration under Section 564(b)(1) of the Act, 21 U.S.C. section 360bbb-3(b)(1), unless the authorization is terminated or revoked sooner.    . Influenza A by PCR 03/31/2020 NEGATIVE  NEGATIVE Final  . Influenza B by PCR 03/31/2020 NEGATIVE  NEGATIVE Final   Comment: (NOTE) The Xpert Xpress SARS-CoV-2/FLU/RSV assay is intended as an aid in  the diagnosis of influenza from Nasopharyngeal swab specimens and  should not be used as a sole basis for treatment. Nasal washings and  aspirates are unacceptable for Xpert Xpress SARS-CoV-2/FLU/RSV  testing.  Fact Sheet for Patients: PinkCheek.be  Fact Sheet for Healthcare Providers: GravelBags.it  This test is not yet approved or cleared by the [place] FDA and  has been authorized for detection and/or diagnosis of SARS-CoV-2 by  FDA under an Emergency Use Authorization (EUA). This EUA will remain  in effect (meaning this test can be used) for the duration of the  Covid-19 declaration under Section 564(b)(1) of the Act, 21  U.S.C. section 360bbb-3(b)(1), unless the authorization is  terminated or revoked. Performed at [facility], [place] [address] [place],  [address]   Admission on 03/16/2020, Discharged on 03/16/2020  Component Date Value Ref Range Status  . Glucose-Capillary 03/16/2020 99  70 - 99 mg/dL Final   Glucose reference range applies only to samples taken after fasting for at least 8 hours.  .  Glucose-Capillary 03/16/2020 221* 70 - 99 mg/dL Final   Glucose reference range applies only to samples taken after fasting for at least 8 hours.  Hospital Outpatient Visit on 03/12/2020  Component Date Value Ref Range Status  . SARS Coronavirus 2 03/12/2020 NEGATIVE  NEGATIVE Final   Comment: (NOTE) SARS-CoV-2 target nucleic acids are NOT DETECTED.  The SARS-CoV-2 RNA is generally detectable in upper and lower respiratory specimens during the acute phase of infection. Negative results do not preclude SARS-CoV-2 infection, do not rule out co-infections with other pathogens, and should not be used as the sole basis for treatment or other patient management decisions. Negative results must be combined with clinical observations, patient history, and epidemiological information. The expected result is Negative.  Fact Sheet for Patients: SugarRoll.be  Fact Sheet for Healthcare Providers: https://www.woods-mathews.com/  This test is not yet approved or cleared by the [place] FDA and  has been authorized for detection and/or diagnosis of SARS-CoV-2 by FDA under an Emergency Use Authorization (EUA). This EUA will remain  in effect (meaning this test can be used) for the duration of the COVID-19 declaration under Se  ction 564(b)(1) of the Act, 21 U.S.C. section 360bbb-3(b)(1), unless the authorization is terminated or revoked sooner.  Performed at [facility], [place] [address] [place], [place] [address]     Allergies: Hydrocodone, Tape, and Codeine  PTA Medications: (Not in a hospital admission)   Medical Decision Making  Patient was medically cleared in the emergency department.\ ED notes and labs reviewed.   Monitor CBGs AC and HS  Continue home medications  Gabapentin 400 mg BID and 1200 mg QHS for mood stability/anxiety Bupropion XL 150 mg daily for depression Risperidone 2 mg QHS for mood  stability Lisinopril 5 mg daily for HTN Victoza SOPN 1.8 mg daily for DM Lantus 30 units BID for DM Glimepiride 4 mg daily BID AC for DM Janumet 50-1000 mg BID AC for DM Synthroid 150 mcg daily for hypothyroidism Atorvastatin 40 mg daily for hyperlipidemia    Recommendations  Based on my evaluation the patient does not appear to have an emergency medical condition.   Patient will be placed in the continuous assessment area at [facility] for treatment and stabilization. She will be reevaluated on 04/01/2020. The treatment team will determine disposition at that time.      Rozetta Nunnery, NP 04/01/20  6:13 AM

## 2020-03-31 NOTE — ED Notes (Signed)
Patient dressed into purple scrubs, belongings taken. One patient belonging bag and one book bag. Security called to wand the pt.

## 2020-03-31 NOTE — ED Provider Notes (Signed)
[facility] DEPT Provider Note   CSN: [id number] Arrival date & time: 03/31/20  2157     History Chief Complaint  Patient presents with  . Suicidal    Rhonda Higgins is a 49 y.o. female.  HPI Patient is a 48 year old female with a history of endometrial cancer, reflux, depression, anxiety, DM, obesity, OSA  Patient has had suicidal thoughts for the past 1 week.  She does not have a specific plan however she states that she has attempted suicide earlier in her life by attempting to jump off a bridge as well as taking medications.  She states she has not had any suicidal thoughts up until this past week when her wife's mother passed away.  She states that this is caused a lot of issues with her and her wife she feels very anxious depressed and concerned for her safety at this time.  She states that she is happy to be related.[name]  She was directed here by her therapist who saw her earlier today.  She states she has no chest pain, shortness of breath, lightheadedness or dizziness.  No physical symptoms today.  She states she is excited to get help and that she is willing to wait for psychiatry visit here.  She has no interest in leaving.  She denies any audio or visual hallucinations and denies any homicidal ideation.     Past Medical History:  Diagnosis Date  . Adenocarcinoma (HCC)    endometrial, FIGO GRADE 1  . Allergic rhinitis   . Atypical chest pain    History of  . Depression   . Elevated liver enzymes   . GERD (gastroesophageal reflux disease)   . Hematuria   . History of endometrial cancer 08-02-2009   oncologist-  dr brewster/ Denman George and dr kinard/  no recurrence   endometrial adenocarinoma Stage 1B, Grade 1, FIGO--  s/p  TAH w/ BSO and pelvic lymph node dissection's and radiation therapy  . History of kidney stones   . History of radiation therapy    2011  pelvic intracavity brachytherapy treatment's for endometrial carcinoma  .  History of thyroid nodule    multinodular goiter s/p  total thyroidectomy 11-19-2015  per pathology -  adenomatoid nodules  . Hyperlipidemia   . Hypothyroidism, postsurgical   . Insulin dependent diabetes mellitus    Type 2  . Left ureteral stone   . Obesity   . OSA (obstructive sleep apnea)    severe OSA  per study 03-08-2010--  noncomplant cpap (no more sleep apnes since lost weight 2019)  . Overactive bladder   . Personality disorder ([place])   . Polyphagia(783.6)   . PONV (postoperative nausea and vomiting)    after ear surgery only one time  . Right lower quadrant pain   . Urgency of urination   . UTI (urinary tract infection)     Patient Active Problem List   Diagnosis Date Noted  . Cholelithiasis with chronic cholecystitis 09/06/2017  . Multinodular goiter (nontoxic) 11/17/2015  . Chest pain 07/20/2014  . Diabetes mellitus without complication ([place]) 25/85/2778  . GERD (gastroesophageal reflux disease) 07/19/2014  . Personality disorder ([place])   . Uterine cancer ([place]) 06/22/2011  . DM 02/09/2010  . HLD (hyperlipidemia) 02/09/2010  . Obstructive sleep apnea 02/09/2010  . Allergic rhinitis 02/09/2010  . History of uterine cancer 02/09/2010    Past Surgical History:  Procedure Laterality Date  . CARDIOVASCULAR STRESS TEST  06/09/2008   normal nuclear study w/  no ischemia/  normal LV function and wall motion , ef 83%  . CHOLECYSTECTOMY N/A 09/06/2017   Procedure: LAPAROSCOPIC CHOLECYSTECTOMY WITH INTRAOPERATIVE CHOLANGIOGRAM;  Surgeon: Armandina Gemma, MD;  Location: [facility];  Service: General;  Laterality: N/A;  . CYSTOSCOPY/RETROGRADE/URETEROSCOPY/STONE EXTRACTION WITH BASKET Left 03/08/2016   Procedure: CYSTOSCOPY/RETROGRADE/URETEROSCOPY/STONE EXTRACTION WITH BASKET, STENT PLACEMENT;  Surgeon: Rana Snare, MD;  Location: [facility];  Service: Urology;  Laterality: Left;  . ENDOMETRIAL BIOPSY    . ERCP N/A 09/07/2017   Procedure: ENDOSCOPIC RETROGRADE  CHOLANGIOPANCREATOGRAPHY (ERCP);  Surgeon: [name], MD;  Location: [facility];  Service: Gastroenterology;  Laterality: N/A;  . ESOPHAGOGASTRODUODENOSCOPY (EGD) WITH PROPOFOL N/A 03/16/2020   Procedure: ESOPHAGOGASTRODUODENOSCOPY (EGD) WITH PROPOFOL;  Surgeon: [name], MD;  Location: [facility];  Service: Gastroenterology;  Laterality: N/A;  unable to locate ampulla, aborted ERCP, changed to EGD  . HOLMIUM LASER APPLICATION Left 2/67/1245   Procedure: HOLMIUM LASER APPLICATION;  Surgeon: Rana Snare, MD;  Location: [facility];  Service: Urology;  Laterality: Left;  . MOUTH SURGERY    . MYRINGECOTMY W/ REMOVAL MIDDLE EAR CHOLESTEATOMA TYPE 1 FASICA TYMPANOPLASTY  09/13/2000  . ROBOTIC ASSISTED TOTAL HYSTERECTOMY WITH BILATERAL SALPINGO OOPHERECTOMY  08-02-2009   at [facility]  dr Denman George   w/  Bilateral pelvic and para aortic lymph node dissection's  . THYROIDECTOMY N/A 11/19/2015   Procedure: TOTAL THYROIDECTOMY;  Surgeon: Armandina Gemma, MD;  Location: [facility];  Service: General;  Laterality: N/A;  . TONSILLECTOMY  age 46  . TRANSTHORACIC ECHOCARDIOGRAM  07/19/2014   ef 55-60%/  trivial TR  . TYMPANOPLASTY Right 1993     OB History    Gravida  0   Para      Term      Preterm      AB      Living        SAB      TAB      Ectopic      Multiple      Live Births              Family History  Problem Relation Age of Onset  . Heart disease Sister   . Heart attack Brother   . Heart disease Brother   . Heart disease Sister   . Diabetes Sister   . Breast cancer Sister   . Asthma Mother   . Heart disease Mother   . Diabetes Mother   . Emphysema Mother   . Hypertension Mother   . Stroke Mother   . Prostate cancer Father     Social History   Tobacco Use  . Smoking status: Never Smoker  . Smokeless tobacco: Never Used  Vaping Use  . Vaping Use: Never used  Substance Use Topics  . Alcohol use: No  . Drug use: No    Home Medications Prior to  Admission medications   Medication Sig Start Date End Date Taking? Authorizing Provider  ALAWAY 0.025 % ophthalmic solution Place 1 drop into both eyes daily as needed (dry eyes).  05/23/19  Yes [provider]  Ascorbic Acid (VITAMIN C) 500 MG CHEW Chew 1,000 mg by mouth daily.   Yes [provider]  atorvastatin (LIPITOR) 40 MG tablet Take 40 mg by mouth every evening.  03/03/19  Yes [provider]  buPROPion (WELLBUTRIN XL) 150 MG 24 hr tablet Take 150 mg by mouth daily.    Yes [provider]  cetirizine (ZYRTEC) 10 MG tablet  Take 10 mg by mouth at bedtime.    Yes [provider]  cholecalciferol (VITAMIN D) 25 MCG (1000 UT) tablet Take 1,000 Units by mouth daily. 08/09/18  Yes [provider]  dicyclomine (BENTYL) 20 MG tablet Take 20 mg by mouth 4 (four) times daily as needed for spasms.  07/09/18  Yes [provider]  gabapentin (NEURONTIN) 400 MG capsule Take 400-1,200 mg by mouth See admin instructions. Take 400 mg in the morning, 400 mg in the afternoon, and 1200 mg at night   Yes [provider]  glimepiride (AMARYL) 4 MG tablet Take 4 mg by mouth 2 (two) times daily.    Yes [provider]  lamoTRIgine (LAMICTAL) 200 MG tablet Take 200 mg by mouth at bedtime.    Yes [provider]  LANTUS SOLOSTAR 100 UNIT/ML Solostar Pen Inject 30 Units into the skin 2 (two) times daily.  11/03/16  Yes [provider]  lisinopril (PRINIVIL,ZESTRIL) 5 MG tablet Take 5 mg by mouth at bedtime.    Yes [provider]  naproxen sodium (ALEVE) 220 MG tablet Take 440 mg by mouth daily as needed (pain).   Yes [provider]  nitroGLYCERIN (NITROSTAT) 0.4 MG SL tablet Place 1 tablet (0.4 mg total) under the tongue every 5 (five) minutes as needed for chest pain. 11/03/18  Yes Mesner, Corene Cornea, MD  ondansetron (ZOFRAN) 4 MG tablet Take 4 mg by mouth 3 (three) times daily as needed for nausea.  07/09/18   Yes [provider]  pantoprazole (PROTONIX) 40 MG tablet Take 40 mg by mouth 2 (two) times daily.    Yes [provider]  risperiDONE (RISPERDAL) 2 MG tablet Take 2 mg by mouth at bedtime.  08/22/16  Yes [provider]  sitaGLIPtin-metformin (JANUMET) 50-1000 MG tablet Take 1 tablet by mouth 2 (two) times daily with a meal.   Yes [provider]  SYNTHROID 150 MCG tablet Take 150 mcg by mouth daily. 03/03/19  Yes [provider]  VICTOZA 18 MG/3ML SOPN Inject 1.8 mg into the skin daily.  07/24/18  Yes [provider]    Allergies    Hydrocodone, Tape, and Codeine  Review of Systems   Review of Systems  Constitutional: Negative for fever.  HENT: Negative for congestion.   Respiratory: Negative for shortness of breath.   Cardiovascular: Negative for chest pain.  Gastrointestinal: Negative for abdominal distention.  Neurological: Negative for dizziness and headaches.  Psychiatric/Behavioral: Positive for suicidal ideas.    Physical Exam Updated Vital Signs BP (!) 147/80 (BP Location: Left Arm)   Pulse 90   Temp 98.3 F (36.8 C) (Oral)   Resp 19   Ht 5\' 3"  (1.6 m)   Wt 105 kg   SpO2 97%   BMI 41.01 kg/m   Physical Exam Vitals and nursing note reviewed.  Constitutional:      General: She is not in acute distress.    Appearance: Normal appearance. She is not ill-appearing.     Comments: At times tearful 49 year old female. pleasant, able to answer questions appropriately and follow commands.  HENT:     Head: Normocephalic and atraumatic.  Eyes:     General: No scleral icterus.       Right eye: No discharge.        Left eye: No discharge.     Conjunctiva/sclera: Conjunctivae normal.  Cardiovascular:     Rate and Rhythm: Normal rate.  Pulmonary:     Effort: Pulmonary  effort is normal.     Breath sounds: Normal breath sounds. No stridor.  Abdominal:     Comments: Obese nontender abdomen.  No guarding or rebound.  No CVA  tenderness.  Neurological:     Mental Status: She is alert and oriented to person, place, and time. Mental status is at baseline.     ED Results / Procedures / Treatments   Labs (all labs ordered are listed, but only abnormal results are displayed) Labs Reviewed  COMPREHENSIVE METABOLIC PANEL - Abnormal; Notable for the following components:      Result Value   Potassium 3.4 (*)    Glucose, Bld 133 (*)    Calcium 8.2 (*)    All other components within normal limits  SALICYLATE LEVEL - Abnormal; Notable for the following components:   Salicylate Lvl <8.3 (*)    All other components within normal limits  ACETAMINOPHEN LEVEL - Abnormal; Notable for the following components:   Acetaminophen (Tylenol), Serum <10 (*)    All other components within normal limits  CBC - Abnormal; Notable for the following components:   Platelets 109 (*)    All other components within normal limits  LIPID PANEL - Abnormal; Notable for the following components:   HDL 26 (*)    All other components within normal limits  URINALYSIS, ROUTINE W REFLEX MICROSCOPIC - Abnormal; Notable for the following components:   Hgb urine dipstick MODERATE (*)    RBC / HPF >50 (*)    Bacteria, UA FEW (*)    All other components within normal limits  RESPIRATORY PANEL BY RT PCR (FLU A&B, COVID)  ETHANOL  RAPID URINE DRUG SCREEN, HOSP PERFORMED  TSH  I-STAT BETA HCG BLOOD, ED ([facility], [facility], [facility] ONLY)    EKG None  Radiology No results found.  Procedures Procedures (including critical care time)  Medications Ordered in ED Medications - No data to display  ED Course  I have reviewed the triage vital signs and the nursing notes.  Pertinent labs & imaging results that were available during my care of the patient were reviewed by me and considered in my medical decision making (see chart for details).  Patient is 49 year old female presented today for suicidal ideation.  See HPI for full details.  Lab work is already  been ordered by Copy.  Additional orders were placed by shuvon R of [facility]  Clinical Course as of Apr 01 30  Wed Mar 31, 2020  1757 Discussed with Earleen Newport of behavioral health urgent care who agrees to take patient after Covid test results.   [WF]  1958 Covid test is negative.  Respiratory Panel by RT PCR (Flu A&B, Covid) - Nasopharyngeal Swab [WF]  1958 Blood work obtained in triage.  Lipid panel unremarkable.  SAG negative.  CBC developed this is her anemia.  No significant electrolyte abnormalities on CMP.  Urinalysis without any abnormalities apart from hgb -- has urologist and will follow up with them.  She states this is normal for her.  She will follow up with OB/GYN about this.  TSH pending at this time.  Rapid urine drug screen negative.  Tylenol salicylate and ethanol negative.   [WF]  2001 Patient agreeable to transfer to [place] form completed by myself.   [WF]    Clinical Course User Index [WF] Tedd Sias, PA   MDM Rules/Calculators/A&P  Patient transferred to behavioral health urgent care EMTALA forms completed by myself per our pathway.  Final Clinical Impression(s) / ED Diagnoses Final diagnoses:  Suicidal ideation    Rx / DC Orders ED Discharge Orders    None       Tedd Sias, [place] 04/01/20 Reita Chard, MD 04/02/20 1056

## 2020-03-31 NOTE — BH Assessment (Signed)
Comprehensive Clinical Assessment (CCA) Screening, Triage and Referral Note  03/31/2020 Rhonda Higgins [id number]   Rhonda Higgins is a 49 year old female who presents to [facility] from [facility] for suicidal thoughts for about a week now. Pt states current stressors:Her and her wife have been involved in a move, recent passing of family members and she might get some of her social security docked. Calm and cooperative, but tearful at this time. Pt currently denies Si, HI, AVH but does admit to multiple SI attempts in the past not recently. . Patient stated that, in the past, she tried jumping off a bridge, cutting her wrists, and taking pills.  Patient stated that she's been "depressed" and "tired of living" for "about a week".  Stated that she recently moved with her wife into her wife's childhood home, which seems to have precipitated her SI, although she can't state why.  Also reported that her mother-in-law passed away on March 05, 2020 and her father-in-law died 2019/12/08.  Patient stated she had a close relationship with both.  Further, patient reported that her elderly dog passed away 4 months ago.  Patient currently denies SI, HI, and AVH. Patient stated that she does receive OP counseling through [facility] and medication management through [facility].  Pt currently endorses depressive symptoms such as: hopelessness, worthlessness, irritation, tearfulness. Pt reports getting 8 hours of sleep with 1 meal a day. Pt reports no previous abuse/trauma, no family hx of mental illness, substance use or SI.  Pt denies access to weapons, no drug or alcohol use. Pt at this time accepting of treatment as well as med adjustments for anxiety and BPD.     Diagnosis: Borderline personality disorder Disposition: Lindon Romp, FNP, recommends pt for overnight observation, reassess in the morning.   Visit Diagnosis:    ICD-10-CM   1. Borderline personality disorder in adult [facility])  F60.3   2.  Suicidal ideation  R45.851     Patient Reported Information How did you hear about [place]? Self   Referral name: [facility]   Referral phone number: No data recorded Whom do you see for routine medical problems? I don't have a doctor   Practice/Facility Name: No data recorded  Practice/Facility Phone Number: No data recorded  Name of Contact: No data recorded  Contact Number: No data recorded  Contact Fax Number: No data recorded  Prescriber Name: No data recorded  Prescriber Address (if known): No data recorded What Is the Reason for Your Visit/Call Today? No data recorded How Long Has This Been Causing You Problems? 1 wk - 1 month  Have You Recently Been in Any Inpatient Treatment (Hospital/Detox/Crisis Center/28-Day Program)? No   Name/Location of Program/Hospital:No data recorded  How Long Were You There? No data recorded  When Were You Discharged? No data recorded Have You Ever Received Services From [facility] Before? No   Who Do You See at [facility]? No data recorded Have You Recently Had Any Thoughts About Hurting Yourself? Yes   Are You Planning to Commit Suicide/Harm Yourself At This time?  No  Have you Recently Had Thoughts About [place]? No   Explanation: No data recorded Have You Used Any Alcohol or Drugs in the Past 24 Hours? No   How Long Ago Did You Use Drugs or Alcohol?  No data recorded  What Did You Use and How Much? No data recorded What Do You Feel Would Help You the Most Today? Medication;Therapy  Do  You Currently Have a Therapist/Psychiatrist? Yes   Name of Therapist/Psychiatrist: [facility]   Have You Been Recently Discharged From Health and safety inspector or Programs? No   Explanation of Discharge From Practice/Program:  No data recorded    CCA Screening Triage Referral Assessment Type of Contact: Face-to-Face   Is this Initial or Reassessment? No  data recorded  Date Telepsych consult ordered in [facility]:  No data recorded  Time Telepsych consult ordered in [facility]:  No data recorded Patient Reported Information Reviewed? Yes   Patient Left Without Being Seen? No data recorded  Reason for Not Completing Assessment: No data recorded Collateral Involvement: none (none)  Does Patient Have a Stage manager Guardian? No data recorded  Name and Contact of Legal Guardian:  No data recorded If Minor and Not Living with Parent(s), Who has Custody? No data recorded Is CPS involved or ever been involved? Never  Is APS involved or ever been involved? Never  Patient Determined To Be At Risk for Harm To Self or Others Based on Review of Patient Reported Information or Presenting Complaint? No data recorded  Method: No data recorded  Availability of Means: No data recorded  Intent: No data recorded  Notification Required: No data recorded  Additional Information for Danger to Others Potential:  No data recorded  Additional Comments for Danger to Others Potential:  No data recorded  Are There Guns or Other Weapons in Your Home?  No data recorded   Types of Guns/Weapons: No data recorded   Are These Weapons Safely Secured?                              No data recorded   Who Could Verify You Are Able To Have These Secured:    No data recorded Do You Have any Outstanding Charges, Pending Court Dates, Parole/Probation? No data recorded Contacted To Inform of Risk of Harm To Self or Others: No data recorded Location of Assessment: [facility]  Does Patient Present under Involuntary Commitment? No   IVC Papers Initial File Date: No data recorded  [place] of Residence: [place]  Patient Currently Receiving the Following Services: Individual Therapy;Medication Management   Determination of Need: Urgent (48 hours)   Options For Referral: Overnight Observation  Donato Heinz, LCSWA

## 2020-04-01 ENCOUNTER — Encounter (HOSPITAL_COMMUNITY): Payer: Self-pay | Admitting: Psychiatry

## 2020-04-01 ENCOUNTER — Inpatient Hospital Stay (HOSPITAL_COMMUNITY)
Admission: AD | Admit: 2020-04-01 | Discharge: 2020-04-04 | DRG: 885 | Disposition: A | Discharge status: Home / Self Care | Payer: Medicaid Other | Source: Intra-hospital | Attending: Psychiatry | Admitting: Psychiatry

## 2020-04-01 DIAGNOSIS — Z923 Personal history of irradiation: Secondary | ICD-10-CM

## 2020-04-01 DIAGNOSIS — G4733 Obstructive sleep apnea (adult) (pediatric): Secondary | ICD-10-CM | POA: Diagnosis present

## 2020-04-01 DIAGNOSIS — Z90710 Acquired absence of both cervix and uterus: Secondary | ICD-10-CM | POA: Diagnosis not present

## 2020-04-01 DIAGNOSIS — Z8542 Personal history of malignant neoplasm of other parts of uterus: Secondary | ICD-10-CM | POA: Diagnosis not present

## 2020-04-01 DIAGNOSIS — J309 Allergic rhinitis, unspecified: Secondary | ICD-10-CM | POA: Diagnosis present

## 2020-04-01 DIAGNOSIS — F603 Borderline personality disorder: Secondary | ICD-10-CM | POA: Diagnosis present

## 2020-04-01 DIAGNOSIS — G4710 Hypersomnia, unspecified: Secondary | ICD-10-CM | POA: Diagnosis present

## 2020-04-01 DIAGNOSIS — Z791 Long term (current) use of non-steroidal anti-inflammatories (NSAID): Secondary | ICD-10-CM

## 2020-04-01 DIAGNOSIS — E890 Postprocedural hypothyroidism: Secondary | ICD-10-CM | POA: Diagnosis present

## 2020-04-01 DIAGNOSIS — E669 Obesity, unspecified: Secondary | ICD-10-CM | POA: Diagnosis present

## 2020-04-01 DIAGNOSIS — Z87442 Personal history of urinary calculi: Secondary | ICD-10-CM | POA: Diagnosis not present

## 2020-04-01 DIAGNOSIS — E782 Mixed hyperlipidemia: Secondary | ICD-10-CM | POA: Diagnosis not present

## 2020-04-01 DIAGNOSIS — F6381 Intermittent explosive disorder: Secondary | ICD-10-CM | POA: Diagnosis present

## 2020-04-01 DIAGNOSIS — Z794 Long term (current) use of insulin: Secondary | ICD-10-CM

## 2020-04-01 DIAGNOSIS — G8929 Other chronic pain: Secondary | ICD-10-CM | POA: Diagnosis present

## 2020-04-01 DIAGNOSIS — F4010 Social phobia, unspecified: Secondary | ICD-10-CM | POA: Diagnosis present

## 2020-04-01 DIAGNOSIS — Z803 Family history of malignant neoplasm of breast: Secondary | ICD-10-CM

## 2020-04-01 DIAGNOSIS — Z8249 Family history of ischemic heart disease and other diseases of the circulatory system: Secondary | ICD-10-CM

## 2020-04-01 DIAGNOSIS — Z823 Family history of stroke: Secondary | ICD-10-CM

## 2020-04-01 DIAGNOSIS — K219 Gastro-esophageal reflux disease without esophagitis: Secondary | ICD-10-CM | POA: Diagnosis present

## 2020-04-01 DIAGNOSIS — Z8042 Family history of malignant neoplasm of prostate: Secondary | ICD-10-CM

## 2020-04-01 DIAGNOSIS — J301 Allergic rhinitis due to pollen: Secondary | ICD-10-CM | POA: Diagnosis not present

## 2020-04-01 DIAGNOSIS — E042 Nontoxic multinodular goiter: Secondary | ICD-10-CM | POA: Diagnosis present

## 2020-04-01 DIAGNOSIS — E119 Type 2 diabetes mellitus without complications: Secondary | ICD-10-CM | POA: Diagnosis present

## 2020-04-01 DIAGNOSIS — Z9049 Acquired absence of other specified parts of digestive tract: Secondary | ICD-10-CM | POA: Diagnosis not present

## 2020-04-01 DIAGNOSIS — E785 Hyperlipidemia, unspecified: Secondary | ICD-10-CM | POA: Diagnosis present

## 2020-04-01 DIAGNOSIS — Z825 Family history of asthma and other chronic lower respiratory diseases: Secondary | ICD-10-CM

## 2020-04-01 DIAGNOSIS — F332 Major depressive disorder, recurrent severe without psychotic features: Principal | ICD-10-CM | POA: Diagnosis present

## 2020-04-01 DIAGNOSIS — Z9152 Personal history of nonsuicidal self-harm: Secondary | ICD-10-CM | POA: Diagnosis not present

## 2020-04-01 DIAGNOSIS — K8010 Calculus of gallbladder with chronic cholecystitis without obstruction: Secondary | ICD-10-CM | POA: Diagnosis present

## 2020-04-01 DIAGNOSIS — Z7984 Long term (current) use of oral hypoglycemic drugs: Secondary | ICD-10-CM

## 2020-04-01 DIAGNOSIS — Z885 Allergy status to narcotic agent status: Secondary | ICD-10-CM

## 2020-04-01 DIAGNOSIS — F32A Depression, unspecified: Secondary | ICD-10-CM | POA: Diagnosis not present

## 2020-04-01 DIAGNOSIS — Z833 Family history of diabetes mellitus: Secondary | ICD-10-CM

## 2020-04-01 DIAGNOSIS — Z9151 Personal history of suicidal behavior: Secondary | ICD-10-CM

## 2020-04-01 DIAGNOSIS — Z91048 Other nonmedicinal substance allergy status: Secondary | ICD-10-CM

## 2020-04-01 DIAGNOSIS — Z79890 Hormone replacement therapy (postmenopausal): Secondary | ICD-10-CM

## 2020-04-01 DIAGNOSIS — Z79899 Other long term (current) drug therapy: Secondary | ICD-10-CM

## 2020-04-01 LAB — GLUCOSE, CAPILLARY: Glucose-Capillary: 112 mg/dL — ABNORMAL HIGH (ref 70–99)

## 2020-04-01 MED ORDER — BUPROPION HCL ER (XL) 150 MG PO TB24
150.0000 mg | ORAL_TABLET | Freq: Every day | ORAL | Status: DC
  Filled 2020-04-01: qty 1

## 2020-04-01 MED ORDER — LIRAGLUTIDE 18 MG/3ML ~~LOC~~ SOPN
1.8000 mg | PEN_INJECTOR | Freq: Every day | SUBCUTANEOUS | Status: DC
  Filled 2020-04-01: qty 3

## 2020-04-01 MED ORDER — RISPERIDONE 2 MG PO TABS
2.0000 mg | ORAL_TABLET | Freq: Every day | ORAL | Status: DC
  Administered 2020-04-01 – 2020-04-03 (×3): 2 mg via ORAL
  Filled 2020-04-01 (×6): qty 1

## 2020-04-01 MED ORDER — BUPROPION HCL ER (XL) 300 MG PO TB24
300.0000 mg | ORAL_TABLET | Freq: Every day | ORAL | Status: DC
  Administered 2020-04-02 – 2020-04-04 (×3): 300 mg via ORAL
  Filled 2020-04-01 (×5): qty 1

## 2020-04-01 MED ORDER — GLIMEPIRIDE 4 MG PO TABS
4.0000 mg | ORAL_TABLET | Freq: Two times a day (BID) | ORAL | Status: DC
  Administered 2020-04-01: 2 mg via ORAL
  Filled 2020-04-01 (×2): qty 1

## 2020-04-01 MED ORDER — MAGNESIUM HYDROXIDE 400 MG/5ML PO SUSP: 30.0000 mL | Freq: Every day | ORAL | Status: DC | PRN

## 2020-04-01 MED ORDER — LISINOPRIL 5 MG PO TABS
5.0000 mg | ORAL_TABLET | Freq: Every day | ORAL | Status: DC
  Administered 2020-04-01 – 2020-04-03 (×3): 5 mg via ORAL
  Filled 2020-04-01 (×6): qty 1

## 2020-04-01 MED ORDER — GABAPENTIN 400 MG PO CAPS
400.0000 mg | ORAL_CAPSULE | Freq: Two times a day (BID) | ORAL | Status: DC
Start: 2020-04-01 — End: 2020-08-03

## 2020-04-01 MED ORDER — GABAPENTIN 400 MG PO CAPS
400.0000 mg | ORAL_CAPSULE | Freq: Two times a day (BID) | ORAL | Status: DC
  Administered 2020-04-01 – 2020-04-04 (×6): 400 mg via ORAL
  Filled 2020-04-01 (×10): qty 1

## 2020-04-01 MED ORDER — LEVOTHYROXINE SODIUM 150 MCG PO TABS
150.0000 ug | ORAL_TABLET | Freq: Every day | ORAL | Status: DC
  Administered 2020-04-02 – 2020-04-04 (×3): 150 ug via ORAL
  Filled 2020-04-01 (×5): qty 1

## 2020-04-01 MED ORDER — METFORMIN HCL 1000 MG PO TABS
1000.0000 mg | ORAL_TABLET | Freq: Two times a day (BID) | ORAL | Status: DC
Start: 2020-04-01 — End: 2021-12-19

## 2020-04-01 MED ORDER — LINAGLIPTIN 5 MG PO TABS
5.0000 mg | ORAL_TABLET | Freq: Every day | ORAL | Status: DC
  Administered 2020-04-02 – 2020-04-04 (×3): 5 mg via ORAL
  Filled 2020-04-01 (×5): qty 1

## 2020-04-01 MED ORDER — HYDROXYZINE HCL 25 MG PO TABS
25.0000 mg | ORAL_TABLET | Freq: Three times a day (TID) | ORAL | Status: DC | PRN
  Administered 2020-04-01 – 2020-04-03 (×4): 25 mg via ORAL
  Filled 2020-04-01 (×3): qty 1

## 2020-04-01 MED ORDER — VICTOZA 18 MG/3ML ~~LOC~~ SOPN: 1.8000 mg | PEN_INJECTOR | Freq: Every day | SUBCUTANEOUS | Status: DC

## 2020-04-01 MED ORDER — ACETAMINOPHEN 325 MG PO TABS
650.0000 mg | ORAL_TABLET | Freq: Four times a day (QID) | ORAL | Status: DC | PRN
  Administered 2020-04-02: 650 mg via ORAL
  Filled 2020-04-01: qty 2

## 2020-04-01 MED ORDER — METFORMIN HCL 500 MG PO TABS
1000.0000 mg | ORAL_TABLET | Freq: Two times a day (BID) | ORAL | Status: DC
  Administered 2020-04-01 – 2020-04-04 (×6): 1000 mg via ORAL
  Filled 2020-04-01 (×10): qty 2

## 2020-04-01 MED ORDER — LINAGLIPTIN 5 MG PO TABS
5.0000 mg | ORAL_TABLET | Freq: Every day | ORAL | Status: DC
Start: 2020-04-02 — End: 2021-12-19

## 2020-04-01 MED ORDER — ATORVASTATIN CALCIUM 40 MG PO TABS
40.0000 mg | ORAL_TABLET | Freq: Every evening | ORAL | Status: DC
  Administered 2020-04-01 – 2020-04-03 (×3): 40 mg via ORAL
  Filled 2020-04-01 (×5): qty 1

## 2020-04-01 MED ORDER — INSULIN GLARGINE 100 UNIT/ML ~~LOC~~ SOLN
30.0000 [IU] | Freq: Two times a day (BID) | SUBCUTANEOUS | Status: DC
  Administered 2020-04-01 – 2020-04-04 (×6): 30 [IU] via SUBCUTANEOUS

## 2020-04-01 MED ORDER — TRAZODONE HCL 50 MG PO TABS
50.0000 mg | ORAL_TABLET | Freq: Every evening | ORAL | Status: DC | PRN
  Administered 2020-04-01 – 2020-04-03 (×3): 50 mg via ORAL
  Filled 2020-04-01 (×3): qty 1

## 2020-04-01 MED ORDER — GLIMEPIRIDE 2 MG PO TABS
2.0000 mg | ORAL_TABLET | Freq: Two times a day (BID) | ORAL | Status: DC
  Administered 2020-04-02 – 2020-04-04 (×5): 2 mg via ORAL
  Filled 2020-04-01 (×9): qty 1

## 2020-04-01 MED ORDER — INSULIN GLARGINE 100 UNIT/ML ~~LOC~~ SOLN
30.0000 [IU] | Freq: Two times a day (BID) | SUBCUTANEOUS | 11 refills | Status: DC
Start: 2020-04-01 — End: 2020-04-26

## 2020-04-01 MED ORDER — ALUM & MAG HYDROXIDE-SIMETH 200-200-20 MG/5ML PO SUSP
30.0000 mL | ORAL | Status: DC | PRN
  Administered 2020-04-04: 30 mL via ORAL
  Filled 2020-04-01: qty 30

## 2020-04-01 MED ORDER — GABAPENTIN 600 MG PO TABS
1200.0000 mg | ORAL_TABLET | Freq: Every day | ORAL | Status: DC
  Administered 2020-04-01 – 2020-04-03 (×3): 1200 mg via ORAL
  Filled 2020-04-01 (×6): qty 2

## 2020-04-01 MED ORDER — LIRAGLUTIDE 18 MG/3ML ~~LOC~~ SOPN
1.8000 mg | PEN_INJECTOR | Freq: Every day | SUBCUTANEOUS | Status: DC
  Administered 2020-04-03 – 2020-04-04 (×2): 1.8 mg via SUBCUTANEOUS

## 2020-04-01 MED ORDER — GABAPENTIN 400 MG PO CAPS
1200.0000 mg | ORAL_CAPSULE | Freq: Every day | ORAL | Status: DC
Start: 2020-04-01 — End: 2020-08-03

## 2020-04-01 NOTE — Progress Notes (Signed)
   04/01/20 1330  Vital Signs  Temp 98.2 F (36.8 C)  Temp Source Oral  Pulse Rate 77  Pulse Rate Source Dinamap  Resp 18  BP 125/70  BP Location Left Arm  BP Method Automatic  Patient Position (if appropriate) Sitting  Oxygen Therapy  SpO2 97 %  Pain Assessment  Pain Scale 0-10  Pain Score 0  Complaints & Interventions  Complains of Agitation;Anxiety;Insomnia;Restless  Height and Weight  Height 5\' 3"  (1.6 m)  Weight 104.3 kg  Type of Scale Used Standing  Type of Weight Actual  BSA (Calculated - sq m) 2.15 sq meters  BMI (Calculated) 40.75  Weight in (lb) to have BMI = 25 140.8   Admission Nursing Note D: Patient is a 49 y.o. Caucasian female admitted voluntarily from [facility]. Patient reports SI thoughts d/t recent reduction in SSI, dog's death -4 mos ago, Mother-in-law death, and other close family members. Pt. Reports that she has a hx of SUA by jumping off bridge, cutting wrists and OD on pills. Pt. Reports that she has a boderline personality.(that she takes Risperdal for explosive rage) and MDD. Pt. Has complaints of agitation, anger, anxiety, concentration, confusion, crying spells, depression, hopelessness, irritability, insomnia, loneliness, nervousness, panic attacks, restlessness, sadness, self-harm, worrying, and tension. Pt. Has a medical hx of OA, DMII, and HTN. Pt reported anxiety this afternoon. A: Patient took scheduled medicine. Pt was given vistaril for anxiety.   Support and encouragement provided Routine safety checks conducted every 15 minutes. Patient  Informed to notify staff with any concerns.   R: Pt. Contracts for safety. Safety maintained.

## 2020-04-01 NOTE — ED Notes (Signed)
CBG was completed 112

## 2020-04-01 NOTE — Progress Notes (Signed)
Received Rhonda Higgins this Am awake sitting in her chair, she was cooperative with VS assessment, glucose check and medication compliant. She endorsed feeling anxious and depressed, but denied feeling suicidal.

## 2020-04-01 NOTE — ED Notes (Signed)
Lunch given.
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
## 2020-04-01 NOTE — Discharge Instructions (Addendum)
Discharge to [facility] 

## 2020-04-01 NOTE — ED Notes (Signed)
Patient discharged.

## 2020-04-01 NOTE — ED Provider Notes (Signed)
FBC/OBS ASAP Discharge Summary  Date and Time: 04/01/2020 11:39 AM  Name: Rhonda Higgins  MRN:  [id number]   Discharge Diagnoses:  Final diagnoses:  Borderline personality disorder in adult [facility])  Suicidal ideation    Subjective: Patient reports this morning that she has just been under a lot of stress.  She states that about a month ago her mother-in-law passed away.  She states that this was the last parent for her and her wife.  She states it has been difficult to cope with the losses in her life.  She states that her wife has been doing better than she has been.  She states it has been difficult for her because she has had to do with the depression of her wife as well.  She continues to endorse having suicidal ideations and severe depression stating that she still feels that she needs to be admitted to the hospital for psychiatric treatment.  Patient does report being compliant with her medications.  Stay Summary: Patient is a 49 year old female with a history of borderline personality disorder, depression, diabetes mellitus, and endometrial cancer presented to the emergency department reporting worsening depression as well as suicidal ideations.  Patient was transferred to the [facility] for continuous assessment.  Patient was restarted on her home medications.  Today patient has continued to report severe depression with worsening suicidal ideations.  She states that the thoughts came on approximately 1 month ago and they have been getting more severe.  She states that she has had previous suicide attempts but it was years ago.  Patient currently denies any homicidal ideations and denies any hallucinations.  Patient feels that she is doing better off served if she was admitted to the hospital for treatment.  Due to patient's safety concerns she was referred to the [facility] and has been accepted.  Patient was transferred to [facility] for inpatient psychiatric treatment.  Total Time spent with patient: 20  minutes  Past Psychiatric History: MDD, anxiety, reported previous suicide attempts multiple times approximately 10 years ago, reported previous hospitalization Past Medical History:  Past Medical History:  Diagnosis Date  . Adenocarcinoma (HCC)    endometrial, FIGO GRADE 1  . Allergic rhinitis   . Atypical chest pain    History of  . Depression   . Elevated liver enzymes   . GERD (gastroesophageal reflux disease)   . Hematuria   . History of endometrial cancer 08-02-2009   oncologist-  dr brewster/ Denman George and dr kinard/  no recurrence   endometrial adenocarinoma Stage 1B, Grade 1, FIGO--  s/p  TAH w/ BSO and pelvic lymph node dissection's and radiation therapy  . History of kidney stones   . History of radiation therapy    2011  pelvic intracavity brachytherapy treatment's for endometrial carcinoma  . History of thyroid nodule    multinodular goiter s/p  total thyroidectomy 11-19-2015  per pathology -  adenomatoid nodules  . Hyperlipidemia   . Hypothyroidism, postsurgical   . Insulin dependent diabetes mellitus    Type 2  . Left ureteral stone   . Obesity   . OSA (obstructive sleep apnea)    severe OSA  per study 03-08-2010--  noncomplant cpap (no more sleep apnes since lost weight 2019)  . Overactive bladder   . Personality disorder ([place])   . Polyphagia(783.6)   . PONV (postoperative nausea and vomiting)    after ear surgery only one time  . Right lower quadrant pain   . Urgency  of urination   . UTI (urinary tract infection)     Past Surgical History:  Procedure Laterality Date  . CARDIOVASCULAR STRESS TEST  06/09/2008   normal nuclear study w/ no ischemia/  normal LV function and wall motion , ef 83%  . CHOLECYSTECTOMY N/A 09/06/2017   Procedure: LAPAROSCOPIC CHOLECYSTECTOMY WITH INTRAOPERATIVE CHOLANGIOGRAM;  Surgeon: Armandina Gemma, MD;  Location: [facility];  Service: General;  Laterality: N/A;  . CYSTOSCOPY/RETROGRADE/URETEROSCOPY/STONE EXTRACTION WITH BASKET Left  03/08/2016   Procedure: CYSTOSCOPY/RETROGRADE/URETEROSCOPY/STONE EXTRACTION WITH BASKET, STENT PLACEMENT;  Surgeon: Rana Snare, MD;  Location: [facility];  Service: Urology;  Laterality: Left;  . ENDOMETRIAL BIOPSY    . ERCP N/A 09/07/2017   Procedure: ENDOSCOPIC RETROGRADE CHOLANGIOPANCREATOGRAPHY (ERCP);  Surgeon: [name], MD;  Location: [facility];  Service: Gastroenterology;  Laterality: N/A;  . ESOPHAGOGASTRODUODENOSCOPY (EGD) WITH PROPOFOL N/A 03/16/2020   Procedure: ESOPHAGOGASTRODUODENOSCOPY (EGD) WITH PROPOFOL;  Surgeon: [name], MD;  Location: [facility];  Service: Gastroenterology;  Laterality: N/A;  unable to locate ampulla, aborted ERCP, changed to EGD  . HOLMIUM LASER APPLICATION Left 1/44/3154   Procedure: HOLMIUM LASER APPLICATION;  Surgeon: Rana Snare, MD;  Location: [facility];  Service: Urology;  Laterality: Left;  . MOUTH SURGERY    . MYRINGECOTMY W/ REMOVAL MIDDLE EAR CHOLESTEATOMA TYPE 1 FASICA TYMPANOPLASTY  09/13/2000  . ROBOTIC ASSISTED TOTAL HYSTERECTOMY WITH BILATERAL SALPINGO OOPHERECTOMY  08-02-2009   at [facility]  dr Denman George   w/  Bilateral pelvic and para aortic lymph node dissection's  . THYROIDECTOMY N/A 11/19/2015   Procedure: TOTAL THYROIDECTOMY;  Surgeon: Armandina Gemma, MD;  Location: [facility];  Service: General;  Laterality: N/A;  . TONSILLECTOMY  age 52  . TRANSTHORACIC ECHOCARDIOGRAM  07/19/2014   ef 55-60%/  trivial TR  . TYMPANOPLASTY Right 1993   Family History:  Family History  Problem Relation Age of Onset  . Heart disease Sister   . Heart attack Brother   . Heart disease Brother   . Heart disease Sister   . Diabetes Sister   . Breast cancer Sister   . Asthma Mother   . Heart disease Mother   . Diabetes Mother   . Emphysema Mother   . Hypertension Mother   . Stroke Mother   . Prostate cancer Father    Family Psychiatric History: None reported Social History:  Social History   Substance and Sexual  Activity  Alcohol Use No     Social History   Substance and Sexual Activity  Drug Use No    Social History   Socioeconomic History  . Marital status: Married    Spouse name: Not on file  . Number of children: N  . Years of education: Not on file  . Highest education level: Not on file  Occupational History  . Occupation: n/a  Tobacco Use  . Smoking status: Never Smoker  . Smokeless tobacco: Never Used  Vaping Use  . Vaping Use: Never used  Substance and Sexual Activity  . Alcohol use: No  . Drug use: No  . Sexual activity: Yes    Birth control/protection: None  Other Topics Concern  . Not on file  Social History Narrative   Lives with partner, [name].   Social Determinants of Health   Financial Resource Strain:   . Difficulty of Paying Living Expenses: Not on file  Food Insecurity:   . Worried About Charity fundraiser in the Last Year: Not on file  . Ran  Out of Food in the Last Year: Not on file  Transportation Needs:   . Lack of Transportation (Medical): Not on file  . Lack of Transportation (Non-Medical): Not on file  Physical Activity:   . Days of Exercise per Week: Not on file  . Minutes of Exercise per Session: Not on file  Stress:   . Feeling of Stress : Not on file  Social Connections:   . Frequency of Communication with Friends and Family: Not on file  . Frequency of Social Gatherings with Friends and Family: Not on file  . Attends Religious Services: Not on file  . Active Member of Clubs or Organizations: Not on file  . Attends Archivist Meetings: Not on file  . Marital Status: Not on file   SDOH:  SDOH Screenings   Alcohol Screen:   . Last Alcohol Screening Score (AUDIT): Not on file  Depression (PHQ2-9):   . PHQ-2 Score: Not on file  Financial Resource Strain:   . Difficulty of Paying Living Expenses: Not on file  Food Insecurity:   . Worried About Charity fundraiser in the Last Year: Not on file  . Ran Out of Food in the  Last Year: Not on file  Housing:   . Last Housing Risk Score: Not on file  Physical Activity:   . Days of Exercise per Week: Not on file  . Minutes of Exercise per Session: Not on file  Social Connections:   . Frequency of Communication with Friends and Family: Not on file  . Frequency of Social Gatherings with Friends and Family: Not on file  . Attends Religious Services: Not on file  . Active Member of Clubs or Organizations: Not on file  . Attends Archivist Meetings: Not on file  . Marital Status: Not on file  Stress:   . Feeling of Stress : Not on file  Tobacco Use: Low Risk   . Smoking Tobacco Use: Never Smoker  . Smokeless Tobacco Use: Never Used  Transportation Needs:   . Film/video editor (Medical): Not on file  . Lack of Transportation (Non-Medical): Not on file    Has this patient used any form of tobacco in the last 30 days? (Cigarettes, Smokeless Tobacco, Cigars, and/or Pipes) A prescription for an FDA-approved tobacco cessation medication was offered at discharge and the patient refused  Current Medications:  Current Facility-Administered Medications  Medication Dose Route Frequency Provider Last Rate Last Admin  . acetaminophen (TYLENOL) tablet 650 mg  650 mg Oral Q6H PRN Rozetta Nunnery, NP      . alum & mag hydroxide-simeth (MAALOX/MYLANTA) 200-200-20 MG/5ML suspension 30 mL  30 mL Oral Q4H PRN Lindon Romp A, NP      . atorvastatin (LIPITOR) tablet 40 mg  40 mg Oral QPM Lindon Romp A, NP      . buPROPion (WELLBUTRIN XL) 24 hr tablet 150 mg  150 mg Oral Daily Lindon Romp A, NP   150 mg at 04/01/20 0953  . gabapentin (NEURONTIN) capsule 1,200 mg  1,200 mg Oral QHS Lindon Romp A, NP      . gabapentin (NEURONTIN) capsule 400 mg  400 mg Oral BID Lindon Romp A, NP   400 mg at 04/01/20 0953  . glimepiride (AMARYL) tablet 4 mg  4 mg Oral BID AC Lindon Romp A, NP   4 mg at 04/01/20 0810  . hydrOXYzine (ATARAX/VISTARIL) tablet 25 mg  25 mg Oral TID PRN  Rozetta Nunnery,  NP      . insulin glargine (LANTUS) injection 30 Units  30 Units Subcutaneous BID Lindon Romp A, NP   30 Units at 04/01/20 0956  . levothyroxine (SYNTHROID) tablet 150 mcg  150 mcg Oral Daily Lindon Romp A, NP   150 mcg at 04/01/20 0618  . linagliptin (TRADJENTA) tablet 5 mg  5 mg Oral Q breakfast Lindon Romp A, NP   5 mg at 04/01/20 0811  . liraglutide (VICTOZA) SOPN 1.8 mg  1.8 mg Subcutaneous Daily Lindon Romp A, NP      . lisinopril (ZESTRIL) tablet 5 mg  5 mg Oral QHS Lindon Romp A, NP      . magnesium hydroxide (MILK OF MAGNESIA) suspension 30 mL  30 mL Oral Daily PRN Lindon Romp A, NP      . metFORMIN (GLUCOPHAGE) tablet 1,000 mg  1,000 mg Oral BID WC Lindon Romp A, NP   1,000 mg at 04/01/20 0811  . risperiDONE (RISPERDAL) tablet 2 mg  2 mg Oral QHS Lindon Romp A, NP      . traZODone (DESYREL) tablet 50 mg  50 mg Oral QHS PRN Rozetta Nunnery, NP       Current Outpatient Medications  Medication Sig Dispense Refill  . atorvastatin (LIPITOR) 40 MG tablet Take 40 mg by mouth every evening.     [name] buPROPion (WELLBUTRIN XL) 150 MG 24 hr tablet Take 150 mg by mouth daily.     [name] glimepiride (AMARYL) 4 MG tablet Take 4 mg by mouth 2 (two) times daily.   5  . lisinopril (PRINIVIL,ZESTRIL) 5 MG tablet Take 5 mg by mouth at bedtime.     . pantoprazole (PROTONIX) 40 MG tablet Take 40 mg by mouth 2 (two) times daily.   0  . risperiDONE (RISPERDAL) 2 MG tablet Take 2 mg by mouth at bedtime.   2  . SYNTHROID 150 MCG tablet Take 150 mcg by mouth daily.    [name] gabapentin (NEURONTIN) 400 MG capsule Take 3 capsules (1,200 mg total) by mouth at bedtime.    . gabapentin (NEURONTIN) 400 MG capsule Take 1 capsule (400 mg total) by mouth 2 (two) times daily at 10 am and 4 pm.    . insulin glargine (LANTUS) 100 UNIT/ML injection Inject 0.3 mLs (30 Units total) into the skin 2 (two) times daily. 10 mL 11  . [START ON 04/02/2020] linagliptin (TRADJENTA) 5 MG TABS tablet Take 1 tablet (5 mg  total) by mouth daily with breakfast. 30 tablet   . [START ON 04/02/2020] liraglutide (VICTOZA) 18 MG/3ML SOPN Inject 1.8 mg into the skin daily.    . metFORMIN (GLUCOPHAGE) 1000 MG tablet Take 1 tablet (1,000 mg total) by mouth 2 (two) times daily with a meal.      PTA Medications: (Not in a hospital admission)   Musculoskeletal  Strength & Muscle Tone: within normal limits Gait & Station: normal Patient leans: N/A  Psychiatric Specialty Exam  Presentation  General Appearance: Appropriate for Environment;Casual  Eye Contact:Good  Speech:Clear and Coherent;Normal Rate  Speech Volume:Decreased  Handedness:Right   Mood and Affect  Mood:Depressed  Affect:Flat;Depressed   Thought Process  Thought Processes:Coherent  Descriptions of Associations:Intact  Orientation:Full (Time, Place and Person)  Thought Content:WDL  Hallucinations:Hallucinations: None  Ideas of Reference:None  Suicidal Thoughts:Suicidal Thoughts: Yes, Passive SI Active Intent and/or Plan: With Intent;With Plan;With Means to Carry Out  Homicidal Thoughts:Homicidal Thoughts: No   Sensorium  Memory:Immediate Good;Recent Good;Remote Good  Judgment:Intact  Insight:Good  Executive Functions  Concentration:Good  Attention Span:Good  [place] of Knowledge:Good  Language:Good   Psychomotor Activity  Psychomotor Activity:Psychomotor Activity: Normal   Assets  Assets:Communication Skills;Desire for Improvement;Financial Resources/Insurance;Housing;Physical Health;Social Support;Transportation   Sleep  Sleep:Sleep: Fair   Physical Exam  Physical Exam Vitals and nursing note reviewed.  Constitutional:      Appearance: She is well-developed.  HENT:     Head: Normocephalic.  Eyes:     Pupils: Pupils are equal, round, and reactive to light.  Cardiovascular:     Rate and Rhythm: Normal rate.  Pulmonary:     Effort: Pulmonary effort is normal.  Musculoskeletal:         General: Normal range of motion.  Neurological:     Mental Status: She is alert and oriented to person, place, and time.    Review of Systems  Constitutional: Negative.   HENT: Negative.   Eyes: Negative.   Respiratory: Negative.   Cardiovascular: Negative.   Gastrointestinal: Negative.   Genitourinary: Negative.   Musculoskeletal: Negative.   Skin: Negative.   Neurological: Negative.   Endo/Heme/Allergies: Negative.   Psychiatric/Behavioral: Positive for depression and suicidal ideas.   Blood pressure 121/70, pulse 61, temperature 97.8 F (36.6 C), temperature source Oral, resp. rate 18, SpO2 98 %. There is no height or weight on file to calculate BMI.   Disposition: Discharged to [place], FNP 04/01/2020, 11:39 AM

## 2020-04-01 NOTE — Tx Team (Signed)
Initial Treatment Plan 04/01/2020 2:13 PM Danyah Guastella Applegarth HYI:[id number]    PATIENT STRESSORS: Financial difficulties Health problems   PATIENT STRENGTHS: Ability for insight Average or above average intelligence Capable of independent living Communication skills General fund of knowledge Motivation for treatment/growth Supportive family/friends   PATIENT IDENTIFIED PROBLEMS: financial  anxiety  depression  SI               DISCHARGE CRITERIA:  Ability to meet basic life and health needs Improved stabilization in mood, thinking, and/or behavior Reduction of life-threatening or endangering symptoms to within safe limits  PRELIMINARY DISCHARGE PLAN: Attend aftercare/continuing care group Participate in family therapy Return to previous living arrangement  PATIENT/FAMILY INVOLVEMENT: This treatment plan has been presented to and reviewed with the patient, Jamell Opfer Applegarth.   The patient has been given the opportunity to ask questions and make suggestions.  Roxanna Mew, RN 04/01/2020, 2:13 PM

## 2020-04-01 NOTE — ED Notes (Signed)
Pt resting at present, no distress noted.  Monitoring for safety. 

## 2020-04-01 NOTE — ED Notes (Signed)
Pt discharged and transported to [facility] via safe transport service. Continue to endorse passive SI at time of transport. Report given to receiving facility staff, Tory Emerald, RN prior to pt's discharge from [facility]. Belongings transported with pt to accepting facility. Safety maintained.

## 2020-04-02 MED ORDER — IBUPROFEN 600 MG PO TABS
600.0000 mg | ORAL_TABLET | Freq: Four times a day (QID) | ORAL | Status: DC | PRN
  Administered 2020-04-03 – 2020-04-04 (×2): 600 mg via ORAL
  Filled 2020-04-02 (×2): qty 1

## 2020-04-02 NOTE — BHH Counselor (Signed)
pt declined follow-up stating that she has an appt with her therapist at [facility] on 04/22/20. pt was encouraged to follow up with her therapist upon d/c. pt also stated that she just saw her psychiatrist at [facility] and declined a follow up appt. CSW encourage her to attend these appts upon d/c. Pt declined to sign ROIs.   Toney Reil, [place] Worker [employer]
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
## 2020-04-02 NOTE — Progress Notes (Signed)
D: Pt visible in milieu / hall on initial contact. Presents with fair eye contact, logical, clear speech, flat affect, guarded and minimal on initial approach. Denies SI, HI, AVH and pain "I'm alright now, but I couldn't sleep last night because of pain in my back". Reports good appetite, normal energy and good concentration level. Rates her depression 7/10, hopelessness 7/10 and anxiety 3/10 with current stressors being "family situations at home, groups, I don't want to do it but I need to face others and participate in groups". Pt's goal for today is "helping me stay out of my head" which she plans to attained by "attending groups and talking to others". Pt brightened up and was more interactive with others as shift progressed. Reports she's tolerating her medications well without discomforts or concerns at this time.  A: Introduced self to pt. Emotional support and availability provided to pt. All medications administered with verbal education and effects monitored. Safety checks maintained at Q 15 minutes intervals without outburst or self harm gestures.   R: Pt receptive to care. Attended scheduled groups and was engaged. Compliant with medications when offered. Denies adverse drug reactions when assessed this shift. Tolerates all PO intake well. Remains safe on and off unit.

## 2020-04-02 NOTE — Progress Notes (Signed)
   04/02/20 2246  COVID-19 Daily Checkoff  Have you had a fever (temp > 37.80C/100F)  in the past 24 hours?  No  If you have had runny nose, nasal congestion, sneezing in the past 24 hours, has it worsened? No  COVID-19 EXPOSURE  Have you traveled outside the state in the past 14 days? No  Have you been in contact with someone with a confirmed diagnosis of COVID-19 or PUI in the past 14 days without wearing appropriate PPE? No  Have you been living in the same home as a person with confirmed diagnosis of COVID-19 or a PUI (household contact)? No  Have you been diagnosed with COVID-19? No

## 2020-04-02 NOTE — Tx Team (Signed)
Interdisciplinary Treatment and Diagnostic Plan Update  04/02/2020 Time of Session: 10:05am Rhonda Higgins MRN: [id number]  Principal Diagnosis: MDD (major depressive disorder), recurrent episode, severe ([place])  Secondary Diagnoses: Principal Problem:   MDD (major depressive disorder), recurrent episode, severe ([place]) Active Problems:   HLD (hyperlipidemia)   Obstructive sleep apnea   Allergic rhinitis   History of uterine cancer   Diabetes mellitus without complication (HCC)   GERD (gastroesophageal reflux disease)   Multinodular goiter (nontoxic)   Cholelithiasis with chronic cholecystitis   Borderline personality disorder ([place])   Intermittent explosive disorder in adult   Current Medications:  Current Facility-Administered Medications  Medication Dose Route Frequency Provider Last Rate Last Admin  . alum & mag hydroxide-simeth (MAALOX/MYLANTA) 200-200-20 MG/5ML suspension 30 mL  30 mL Oral Q4H PRN Money, Lowry Ram, FNP      . atorvastatin (LIPITOR) tablet 40 mg  40 mg Oral QPM Money, Darnelle Maffucci B, FNP   40 mg at 04/01/20 1626  . buPROPion (WELLBUTRIN XL) 24 hr tablet 300 mg  300 mg Oral Daily Dagar, Anjali, MD   300 mg at 04/02/20 0829  . gabapentin (NEURONTIN) capsule 400 mg  400 mg Oral BID Money, Lowry Ram, FNP   400 mg at 04/02/20 6761  . gabapentin (NEURONTIN) tablet 1,200 mg  1,200 mg Oral QHS Money, Travis B, FNP   1,200 mg at 04/01/20 2142  . glimepiride (AMARYL) tablet 2 mg  2 mg Oral BID AC Dagar, Anjali, MD   2 mg at 04/02/20 9509  . hydrOXYzine (ATARAX/VISTARIL) tablet 25 mg  25 mg Oral TID PRN Honor Junes, MD   25 mg at 04/02/20 0831  . ibuprofen (ADVIL) tablet 600 mg  600 mg Oral Q6H PRN Dagar, Meredith Staggers, MD      . insulin glargine (LANTUS) injection 30 Units  30 Units Subcutaneous BID Money, Lowry Ram, FNP   30 Units at 04/02/20 0934  . levothyroxine (SYNTHROID) tablet 150 mcg  150 mcg Oral Q0600 Money, Lowry Ram, FNP   150 mcg at 04/02/20 3267  . linagliptin  (TRADJENTA) tablet 5 mg  5 mg Oral Daily Money, Darnelle Maffucci B, FNP   5 mg at 04/02/20 0830  . liraglutide (VICTOZA) SOPN 1.8 mg  1.8 mg Subcutaneous Daily Sharma Covert, MD      . lisinopril (ZESTRIL) tablet 5 mg  5 mg Oral QHS Money, Lowry Ram, FNP   5 mg at 04/01/20 2142  . magnesium hydroxide (MILK OF MAGNESIA) suspension 30 mL  30 mL Oral Daily PRN Money, Darnelle Maffucci B, FNP      . metFORMIN (GLUCOPHAGE) tablet 1,000 mg  1,000 mg Oral BID WC Money, Darnelle Maffucci B, FNP   1,000 mg at 04/02/20 0829  . risperiDONE (RISPERDAL) tablet 2 mg  2 mg Oral QHS Money, Lowry Ram, FNP   2 mg at 04/01/20 2142  . traZODone (DESYREL) tablet 50 mg  50 mg Oral QHS PRN Dagar, Meredith Staggers, MD   50 mg at 04/01/20 2142   PTA Medications: Medications Prior to Admission  Medication Sig Dispense Refill Last Dose  . atorvastatin (LIPITOR) 40 MG tablet Take 40 mg by mouth every evening.      [name] buPROPion (WELLBUTRIN XL) 150 MG 24 hr tablet Take 150 mg by mouth daily.      [name] gabapentin (NEURONTIN) 400 MG capsule Take 3 capsules (1,200 mg total) by mouth at bedtime.     . gabapentin (NEURONTIN) 400 MG capsule Take 1 capsule (400 mg total) by  mouth 2 (two) times daily at 10 am and 4 pm.     . glimepiride (AMARYL) 4 MG tablet Take 4 mg by mouth 2 (two) times daily.   5   . insulin glargine (LANTUS) 100 UNIT/ML injection Inject 0.3 mLs (30 Units total) into the skin 2 (two) times daily. 10 mL 11   . linagliptin (TRADJENTA) 5 MG TABS tablet Take 1 tablet (5 mg total) by mouth daily with breakfast. 30 tablet    . liraglutide (VICTOZA) 18 MG/3ML SOPN Inject 1.8 mg into the skin daily.     [name] lisinopril (PRINIVIL,ZESTRIL) 5 MG tablet Take 5 mg by mouth at bedtime.      . metFORMIN (GLUCOPHAGE) 1000 MG tablet Take 1 tablet (1,000 mg total) by mouth 2 (two) times daily with a meal.     . pantoprazole (PROTONIX) 40 MG tablet Take 40 mg by mouth 2 (two) times daily.   0   . risperiDONE (RISPERDAL) 2 MG tablet Take 2 mg by mouth at bedtime.   2   .  SYNTHROID 150 MCG tablet Take 150 mcg by mouth daily.       Patient Stressors: Financial difficulties Health problems  Patient Strengths: Ability for insight Average or above average intelligence Capable of independent living Communication skills General fund of knowledge Motivation for treatment/growth Supportive family/friends  Treatment Modalities: Medication Management, Group therapy, Case management,  1 to 1 session with clinician, Psychoeducation, Recreational therapy.   Physician Treatment Plan for Primary Diagnosis: MDD (major depressive disorder), recurrent episode, severe ([place]) Long Term Goal(s):     Short Term Goals:    Medication Management: Evaluate patient's response, side effects, and tolerance of medication regimen.  Therapeutic Interventions: 1 to 1 sessions, Unit Group sessions and Medication administration.  Evaluation of Outcomes: Not Met  Physician Treatment Plan for Secondary Diagnosis: Principal Problem:   MDD (major depressive disorder), recurrent episode, severe ([place]) Active Problems:   HLD (hyperlipidemia)   Obstructive sleep apnea   Allergic rhinitis   History of uterine cancer   Diabetes mellitus without complication (HCC)   GERD (gastroesophageal reflux disease)   Multinodular goiter (nontoxic)   Cholelithiasis with chronic cholecystitis   Borderline personality disorder ([place])   Intermittent explosive disorder in adult  Long Term Goal(s):     Short Term Goals:       Medication Management: Evaluate patient's response, side effects, and tolerance of medication regimen.  Therapeutic Interventions: 1 to 1 sessions, Unit Group sessions and Medication administration.  Evaluation of Outcomes: Not Met   RN Treatment Plan for Primary Diagnosis: MDD (major depressive disorder), recurrent episode, severe ([place]) Long Term Goal(s): Knowledge of disease and therapeutic regimen to maintain health will improve  Short Term Goals: Ability to remain  free from injury will improve, Ability to demonstrate self-control, Ability to participate in decision making will improve, Ability to disclose and discuss suicidal ideas, Ability to identify and develop effective coping behaviors will improve and Compliance with prescribed medications will improve  Medication Management: RN will administer medications as ordered by provider, will assess and evaluate patient's response and provide education to patient for prescribed medication. RN will report any adverse and/or side effects to prescribing provider.  Therapeutic Interventions: 1 on 1 counseling sessions, Psychoeducation, Medication administration, Evaluate responses to treatment, Monitor vital signs and CBGs as ordered, Perform/monitor CIWA, COWS, AIMS and Fall Risk screenings as ordered, Perform wound care treatments as ordered.  Evaluation of Outcomes: Not Met   LCSW Treatment Plan for  Primary Diagnosis: MDD (major depressive disorder), recurrent episode, severe ([place]) Long Term Goal(s): Safe transition to appropriate next level of care at discharge, Engage patient in therapeutic group addressing interpersonal concerns.  Short Term Goals: Engage patient in aftercare planning with referrals and resources, Increase social support, Increase emotional regulation, Facilitate acceptance of mental health diagnosis and concerns, Identify triggers associated with mental health/substance abuse issues and Increase skills for wellness and recovery  Therapeutic Interventions: Assess for all discharge needs, 1 to 1 time with Social worker, Explore available resources and support systems, Assess for adequacy in community support network, Educate family and significant other(s) on suicide prevention, Complete Psychosocial Assessment, Interpersonal group therapy.  Evaluation of Outcomes: Not Met   Progress in Treatment: Attending groups: No. Participating in groups: No. Taking medication as prescribed:  Yes. Toleration medication: Yes. Family/Significant other contact made: No, will contact:  If consents are given  Patient understands diagnosis: Yes. and No. Discussing patient identified problems/goals with staff: Yes. Medical problems stabilized or resolved: Yes. Denies suicidal/homicidal ideation: Yes. Issues/concerns per patient self-inventory: No.   New problem(s) identified: No, Describe:  None  New Short Term/Long Term Goal(s): medication stabilization, elimination of SI thoughts, development of comprehensive mental wellness plan.   Patient Goals:  "To get out of my own head and to stop over-thinking"  Discharge Plan or Barriers: Patient recently admitted. CSW will continue to follow and assess for appropriate referrals and possible discharge planning.   Reason for Continuation of Hospitalization: Depression Medication stabilization Suicidal ideation  Estimated Length of Stay: 3 to 5 days   Attendees: Patient: Rhonda Higgins 04/02/2020   Physician: Ernie Hew, MD 04/02/2020   Nursing:  04/02/2020   RN Care Manager: 04/02/2020   Social Worker: Verdis Frederickson, LCSW 04/02/2020   Recreational Therapist:  04/02/2020   Other:  04/02/2020   Other:  04/02/2020   Other: 04/02/2020      Scribe for Treatment Team: Darleen Crocker, [place] 04/02/2020 11:39 AM

## 2020-04-02 NOTE — BHH Suicide Risk Assessment (Signed)
[facility] Admission Suicide Risk Assessment   Nursing information obtained from:  Patient Demographic factors:  Gay, lesbian, or bisexual orientation, Low socioeconomic status Current Mental Status:  Suicidal ideation indicated by patient, Self-harm thoughts Loss Factors:  Financial problems / change in socioeconomic status Historical Factors:  Anniversary of important loss, Impulsivity Risk Reduction Factors:  Sense of responsibility to family, Living with another person, especially a relative, Positive social support  Total Time spent with patient: 20 minutes Principal Problem: MDD (major depressive disorder), recurrent episode, severe ([place]) Diagnosis:  Principal Problem:   MDD (major depressive disorder), recurrent episode, severe ([place]) Active Problems:   HLD (hyperlipidemia)   Obstructive sleep apnea   Allergic rhinitis   History of uterine cancer   Diabetes mellitus without complication (HCC)   GERD (gastroesophageal reflux disease)   Multinodular goiter (nontoxic)   Cholelithiasis with chronic cholecystitis   Borderline personality disorder ([place])   Intermittent explosive disorder in adult  Subjective Data:   Rhonda Higgins is a 49 yo F who presented [facility] to [facility] on 04/05/20 with recurring suicidal thoughts and worsening depression * 1 week. She was transferred to [facility] for continuous assessment and then admitted voluntarily to [facility] for continued suicidal ideations on April 05, 2020.  Today she denies suicidal ideations. She states she started feeling suicidal 1 week ago after her wife's mother passed away (04/05/2020). She endorses  feeling sad, hopeless, low energy, guilty about " not good enough, because of my mental illness",impaired memory, recurrent suicidal thoughts, increased sleep (12 hours), anxiety, difficulty getting out of bed . She states she  also struggles with anxiety, worrying a lot, can not stop her mind over thinking, trouble relaxing, not irritable or  annoyed , no panic attacks. She adds that she has extreme social anxiety, she can't go to store by herself, or can't get her own food in weddings.  She identifies multiple stressors.She states she and her wife are in process of moving their home. They moved in with wife's mother 2 months ago to take care of her and now when she passed away, they are moving to wife's childhood home and that is pretty distressing for her. She is concerned about her social security getting docked now as the wife inherited the childhood home, so, they are concerned about fixed income now. might get docked up. Apart from that her dog died 2 months ago, that is alos big stressor for her. She states she was directed here by her therapist whom she saw on 04/05/2020 Omar Person). She has been seeing her from last 8 years. She denies any drug abuse. She states she loves cooking and feeding family and hanging around her friends. Last time she did this was on July 4th but has not done anything after that because she got busy not because she did not feel like doing any of it. She considers her wife, her animals, sisters and friends as her support system but she states she does think about them when have suicidal ideations.    Continued Clinical Symptoms:  Alcohol Use Disorder Identification Test Final Score (AUDIT): 0 The "Alcohol Use Disorders Identification Test", Guidelines for Use in Primary Care, Second Edition.  World Pharmacologist [facility]). Score between 0-7:  no or low risk or alcohol related problems. Score between 8-15:  moderate risk of alcohol related problems. Score between 16-19:  high risk of alcohol related problems. Score 20 or above:  warrants further diagnostic evaluation for alcohol dependence and treatment.   CLINICAL FACTORS:  Personality Disorders:   Cluster B Chronic Pain More than one psychiatric diagnosis Previous Psychiatric Diagnoses and Treatments Medical Diagnoses and  Treatments/Surgeries  Impulsivity   Musculoskeletal: Strength & Muscle Tone: within normal limits Gait & Station: normal Patient leans: N/A  Psychiatric Specialty Exam: Physical Exam Constitutional:      Appearance: Normal appearance. She is obese.  Musculoskeletal:     Cervical back: Normal range of motion.  Neurological:     Mental Status: She is alert.     Review of Systems  Constitutional: Negative for chills and fever.  HENT: Negative for congestion.   Respiratory: Negative for cough and shortness of breath.   Cardiovascular: Negative for chest pain and palpitations.  Gastrointestinal: Negative for abdominal pain, constipation and diarrhea.  Musculoskeletal: Positive for back pain. Negative for joint swelling.    Blood pressure 119/65, pulse 85, temperature 97.8 F (36.6 C), temperature source Oral, resp. rate 16, height 5\' 3"  (1.6 m), weight 104.3 kg, SpO2 97 %.Body mass index is 40.74 kg/m.  General Appearance: Casual and Fairly Groomed  Eye Contact:  Fair  Speech:  Clear and Coherent and Normal Rate  Volume:  Normal  Mood:  "better"  Affect:  Constricted and appropriate, euthymic  Thought Process:  Coherent, Goal Directed and Linear  Orientation:  Full (Time, Place, and Person)  Thought Content:  WDL and Logical  Suicidal Thoughts:  No  Homicidal Thoughts:  No  Memory:  Immediate;   Good Recent;   Good  Judgement:  Good  Insight:  Good  Psychomotor Activity:  Normal  Concentration:  Concentration: Fair  Recall:  Good  Fund of Knowledge:  Good  Language:  Good  Akathisia:  No  Handed:  Right  AIMS (if indicated):     Assets:  Communication Skills Desire for Improvement Housing Intimacy Resilience Social Support  ADL's:  Intact  Cognition:  WNL  Sleep:         COGNITIVE FEATURES THAT CONTRIBUTE TO RISK:  Polarized thinking    SUICIDE RISK:    Mild:  Suicidal ideation of limited frequency, intensity, duration, and specificity.  There are no  identifiable plans, no associated intent, mild dysphoria and related symptoms, good self-control (both objective and subjective assessment), few other risk factors, and identifiable protective factors, including available and accessible social support.  [name]    PLAN OF CARE:  Rhonda Higgins is a 49 yo F with past psychiatric hx of borderline personality disorder, BDD, IED who presented voluntarily to [facility] on 04/01/2020 with recurring suicidal thoughts and worsening depression * 1 week in the context of several life stressors. She was transferred to [facility] for continuous assessment and then admitted voluntarily to [facility] for continued suicidal ideations on 04/01/2020. Labs reviewed, Her labs shows increased blood glucose 112, TSH 0.436 (wnl), lipids as below, negative UDS. Will Increase wellbutrin from 150 to 300 mg and monitor for effectiveness and adjust as clinically indicated/approriate. Please see H &P for full plan  Lipid Panel     Component Value Date/Time   CHOL 90 03/31/2020 1857   TRIG 149 03/31/2020 1857   HDL 26 (L) 03/31/2020 1857   CHOLHDL 3.5 03/31/2020 1857   VLDL 30 03/31/2020 1857   [place] 34 94/49/6759 1638       I certify that inpatient services furnished can reasonably be expected to improve the patient's condition.   Ival Bible, MD 04/02/2020, 2:15 PM

## 2020-04-02 NOTE — BHH Suicide Risk Assessment (Signed)
[place] INPATIENT:  Family/Significant Other Suicide Prevention Education  Suicide Prevention Education: Education Completed; Crista Elliot, wife, [phone] been identified by the patient as the family member/significant other with whom the patient will be residing, and identified as the person(s) who will aid the patient in the event of a mental health crisis (suicidal ideations/suicide attempt). With written consent from the patient, the family member/significant other has been provided the following suicide prevention education, prior to the and/or following the discharge of the patient.  The suicide prevention education provided includes the following:   Suicide risk factors   Suicide prevention and interventions   National Suicide Hotline telephone number   [facility] assessment telephone number   [facility] Emergency Assistance [place] and/or Residential Mobile Crisis Unit telephone number  Request made of family/significant other to:   Remove weapons (e.g., guns, rifles, knives), all items previously/currently identified as safety concern.   Remove drugs/medications (over-the-counter, prescriptions, illicit drugs), all items previously/currently identified as a safety concern.  The family member/significant other verbalizes understanding of the suicide prevention education information provided. The family member/significant other agrees to remove the items of safety concern listed above.  CSW asked Crista Elliot, wife, [phone], what she felt led to her wife's hospitalization. She stated, "We are currently in the process of moving and we recently lost my mom and it has just been a lot. She worries about me and everything.". She stated there are no weapons or medications in the home and asked when her wife would be discharged. CSW stated that the doctor decides that but that she has seen an improvement in mood today.  Wife stated no safety concerns permitting the pt's d/c.  Toney Reil, [place] Worker [employer]

## 2020-04-02 NOTE — Progress Notes (Signed)
Cooperative with treatment, she remains sad and depressed and flat on approach, she was medication compliant and spent most of shift resting in the bed. She had minimal interaction with peers and staff on. She got up x1 through reporting back given tylenol with effect. Patient appears to be in bed resting quietly at this time.

## 2020-04-02 NOTE — Progress Notes (Signed)
Adult Psychoeducational Group Note  Date:  04/02/2020 Time:  3:29 PM  Group Topic/Focus:  Emotional Education:   The focus of this group is to discuss what feelings/emotions are, and how they are experienced.  Participation Level:  Active  Participation Quality:  Appropriate  Affect:  Appropriate  Cognitive:  Alert  Insight: Appropriate  Engagement in Group:  Engaged  Modes of Intervention:  Discussion and Education  Additional Comments:  Pt attended group and shared apropriately.  Khrystian Schauf E 04/02/2020, 3:29 PM

## 2020-04-02 NOTE — Progress Notes (Signed)
Patient was compliant with her scheduled medications and requested trazodone for sleep. She reported having had a good day. Support given and safety maintained with 15 min checks.   04/02/20 2300  Psych Admission Type (Psych Patients Only)  Admission Status Voluntary  Psychosocial Assessment  Patient Complaints Depression  Eye Contact Brief  Facial Expression Flat;Sad  Affect Depressed;Flat  Speech Logical/coherent  Interaction Minimal  Motor Activity Slow  Appearance/Hygiene Unremarkable  Behavior Characteristics Cooperative  Mood Depressed  Thought Process  Coherency WDL  Content WDL  Delusions None reported or observed  Perception WDL  Hallucination None reported or observed  Judgment WDL  Confusion None  Danger to Self  Current suicidal ideation? Denies  Danger to Others  Danger to Others None reported or observed

## 2020-04-03 DIAGNOSIS — F332 Major depressive disorder, recurrent severe without psychotic features: Secondary | ICD-10-CM | POA: Diagnosis not present

## 2020-04-03 LAB — GLUCOSE, CAPILLARY: Glucose-Capillary: 121 mg/dL — ABNORMAL HIGH (ref 70–99)

## 2020-04-03 NOTE — Progress Notes (Signed)
[place] Group Notes:  (Nursing/MHT/Case Management/Adjunct)  Date:  04/03/2020  Time:  2:39 PM  Type of Therapy:  Nurse Education  Participation Level:  Active  Participation Quality:  Appropriate and Sharing  Affect:  Appropriate  Cognitive:  Alert, Appropriate and Oriented  Insight:  Good  Engagement in Group:  Engaged  Modes of Intervention:  Activity, Discussion, Education, Socialization and Support  Summary of Progress/Problems:The purpose of this group is to introduce patients to the benefits and safety of aromatherapy. Pt practices aromatherapy at home. She rates her day as a 9. Pt was sharing and supportive during group.   Mosie Lukes 04/03/2020, 2:39 PM

## 2020-04-03 NOTE — Progress Notes (Signed)
   04/03/20 1000  Psych Admission Type (Psych Patients Only)  Admission Status Voluntary  Psychosocial Assessment  Patient Complaints Depression  Eye Contact Fair  Facial Expression Sad  Affect Depressed  Speech Logical/coherent  Interaction Assertive  Motor Activity Other (Comment) (WDL)  Appearance/Hygiene Unremarkable  Behavior Characteristics Cooperative  Mood Depressed  Thought Process  Coherency WDL  Content WDL  Delusions None reported or observed  Perception WDL  Hallucination None reported or observed  Judgment WDL  Confusion None  Danger to Self  Current suicidal ideation? Denies  Danger to Others  Danger to Others None reported or observed   Pt reports she is starting to feel better, less depressed. Pt talked about her multiple losses and how she called her therapist knowing she needed help. Pt was observed out in the dayroom interacting and playing cards with her peers. Pt takes home medication of insulin in a pen but does not have a needle with her for the pen. Contacted pharmacy and they do not have needles for pt's pen. Asked pt to contact family member to bring to the hospital. She acknowledges understanding and writer will give medication as soon as the needle arrives. MD made aware. Pt denies si and hi. Safety maintained on the unit.

## 2020-04-03 NOTE — Progress Notes (Signed)
[facility] MD Progress Note  04/03/2020 3:36 PM Rhonda Higgins  MRN:  [id number] Subjective:  Rhonda Higgins is a 49 yo F who presented voluntarily to [facility] on 04/01/2020 with recurring suicidal thoughts and worsening depression 1 week. She was transferred to [facility] for continuous assessment and then admitted voluntarily to [facility] for continued suicidal ideations on 04/01/2020.  On assessment this morning, she reports her mood is "really good." Wellbutrin was increased yesterday, and she feels this medication change has been helpful for mood. She states she has been stressed recently related to her mother-in-law's recent death and fears that inheriting her house will interfere with her disability income. Her dog passed away recently as well, and her sister has cancer. She denies SI and states she feels better able to manage stressors at home after the last two days of stabilization in the hospital. She identifies cooking, cleaning, and music as healthy coping skills. She continues on home medications Risperdal for intermittent explosiveness and gabapentin for chronic back pain. She denies AVH and shows no signs of responding to internal stimuli. She is seen at [facility] for counseling and sees a Multimedia programmer as well. She does report some difficulty sleeping last night but feels it was related to an uncomfortable bed here and does not feel she needs any medication changes. She is asking about a discharge date.  With patient's expressed consent, I spoke with her wife [name] [phone]. [name] agrees that the patient seems improved from admission. She denies any guns or weapons in the home. She denies safety concerns for discharge tomorrow if the patient continues to do well.  Principal Problem: MDD (major depressive disorder), recurrent episode, severe ([place]) Diagnosis: Principal Problem:   MDD (major depressive disorder), recurrent episode, severe ([place]) Active Problems:   HLD  (hyperlipidemia)   Obstructive sleep apnea   Allergic rhinitis   History of uterine cancer   Diabetes mellitus without complication (HCC)   GERD (gastroesophageal reflux disease)   Multinodular goiter (nontoxic)   Cholelithiasis with chronic cholecystitis   Borderline personality disorder ([place])   Intermittent explosive disorder in adult  Total Time spent with patient: 20 minutes  Past Psychiatric History: See admission H&P  Past Medical History:  Past Medical History:  Diagnosis Date  . Adenocarcinoma (HCC)    endometrial, FIGO GRADE 1  . Allergic rhinitis   . Atypical chest pain    History of  . Depression   . Elevated liver enzymes   . GERD (gastroesophageal reflux disease)   . Hematuria   . History of endometrial cancer 08-02-2009   oncologist-  dr brewster/ Denman George and dr kinard/  no recurrence   endometrial adenocarinoma Stage 1B, Grade 1, FIGO--  s/p  TAH w/ BSO and pelvic lymph node dissection's and radiation therapy  . History of kidney stones   . History of radiation therapy    2011  pelvic intracavity brachytherapy treatment's for endometrial carcinoma  . History of thyroid nodule    multinodular goiter s/p  total thyroidectomy 11-19-2015  per pathology -  adenomatoid nodules  . Hyperlipidemia   . Hypothyroidism, postsurgical   . Insulin dependent diabetes mellitus    Type 2  . Left ureteral stone   . Obesity   . OSA (obstructive sleep apnea)    severe OSA  per study 03-08-2010--  noncomplant cpap (no more sleep apnes since lost weight 2019)  . Overactive bladder   . Personality disorder ([place])   . Polyphagia(783.6)   .  PONV (postoperative nausea and vomiting)    after ear surgery only one time  . Right lower quadrant pain   . Urgency of urination   . UTI (urinary tract infection)     Past Surgical History:  Procedure Laterality Date  . CARDIOVASCULAR STRESS TEST  06/09/2008   normal nuclear study w/ no ischemia/  normal LV function and wall motion , ef  83%  . CHOLECYSTECTOMY N/A 09/06/2017   Procedure: LAPAROSCOPIC CHOLECYSTECTOMY WITH INTRAOPERATIVE CHOLANGIOGRAM;  Surgeon: Armandina Gemma, MD;  Location: [facility];  Service: General;  Laterality: N/A;  . CYSTOSCOPY/RETROGRADE/URETEROSCOPY/STONE EXTRACTION WITH BASKET Left 03/08/2016   Procedure: CYSTOSCOPY/RETROGRADE/URETEROSCOPY/STONE EXTRACTION WITH BASKET, STENT PLACEMENT;  Surgeon: Rana Snare, MD;  Location: [facility];  Service: Urology;  Laterality: Left;  . ENDOMETRIAL BIOPSY    . ERCP N/A 09/07/2017   Procedure: ENDOSCOPIC RETROGRADE CHOLANGIOPANCREATOGRAPHY (ERCP);  Surgeon: [name], MD;  Location: [facility];  Service: Gastroenterology;  Laterality: N/A;  . ESOPHAGOGASTRODUODENOSCOPY (EGD) WITH PROPOFOL N/A 03/16/2020   Procedure: ESOPHAGOGASTRODUODENOSCOPY (EGD) WITH PROPOFOL;  Surgeon: [name], MD;  Location: [facility];  Service: Gastroenterology;  Laterality: N/A;  unable to locate ampulla, aborted ERCP, changed to EGD  . HOLMIUM LASER APPLICATION Left 08/10/9796   Procedure: HOLMIUM LASER APPLICATION;  Surgeon: Rana Snare, MD;  Location: [facility];  Service: Urology;  Laterality: Left;  . MOUTH SURGERY    . MYRINGECOTMY W/ REMOVAL MIDDLE EAR CHOLESTEATOMA TYPE 1 FASICA TYMPANOPLASTY  09/13/2000  . ROBOTIC ASSISTED TOTAL HYSTERECTOMY WITH BILATERAL SALPINGO OOPHERECTOMY  08-02-2009   at [facility]  dr Denman George   w/  Bilateral pelvic and para aortic lymph node dissection's  . THYROIDECTOMY N/A 11/19/2015   Procedure: TOTAL THYROIDECTOMY;  Surgeon: Armandina Gemma, MD;  Location: [facility];  Service: General;  Laterality: N/A;  . TONSILLECTOMY  age 12  . TRANSTHORACIC ECHOCARDIOGRAM  07/19/2014   ef 55-60%/  trivial TR  . TYMPANOPLASTY Right 1993   Family History:  Family History  Problem Relation Age of Onset  . Heart disease Sister   . Heart attack Brother   . Heart disease Brother   . Heart disease Sister   . Diabetes Sister   . Breast cancer  Sister   . Asthma Mother   . Heart disease Mother   . Diabetes Mother   . Emphysema Mother   . Hypertension Mother   . Stroke Mother   . Prostate cancer Father    Family Psychiatric  History: See admission H&P Social History:  Social History   Substance and Sexual Activity  Alcohol Use No     Social History   Substance and Sexual Activity  Drug Use No    Social History   Socioeconomic History  . Marital status: Married    Spouse name: Not on file  . Number of children: N  . Years of education: 10  . Highest education level: 10th grade  Occupational History  . Occupation: n/a  Tobacco Use  . Smoking status: Never Smoker  . Smokeless tobacco: Never Used  Vaping Use  . Vaping Use: Never used  Substance and Sexual Activity  . Alcohol use: No  . Drug use: No  . Sexual activity: Yes    Birth control/protection: None  Other Topics Concern  . Not on file  Social History Narrative   Lives with partner, [name].   Social Determinants of Health   Financial Resource Strain:   . Difficulty of Paying Living Expenses: Not on  file  Food Insecurity:   . Worried About Charity fundraiser in the Last Year: Not on file  . Ran Out of Food in the Last Year: Not on file  Transportation Needs:   . Lack of Transportation (Medical): Not on file  . Lack of Transportation (Non-Medical): Not on file  Physical Activity:   . Days of Exercise per Week: Not on file  . Minutes of Exercise per Session: Not on file  Stress:   . Feeling of Stress : Not on file  Social Connections:   . Frequency of Communication with Friends and Family: Not on file  . Frequency of Social Gatherings with Friends and Family: Not on file  . Attends Religious Services: Not on file  . Active Member of Clubs or Organizations: Not on file  . Attends Archivist Meetings: Not on file  . Marital Status: Not on file   Additional Social History:                         Sleep:  Fair  Appetite:  Good  Current Medications: Current Facility-Administered Medications  Medication Dose Route Frequency Provider Last Rate Last Admin  . alum & mag hydroxide-simeth (MAALOX/MYLANTA) 200-200-20 MG/5ML suspension 30 mL  30 mL Oral Q4H PRN Money, Lowry Ram, FNP      . atorvastatin (LIPITOR) tablet 40 mg  40 mg Oral QPM Money, Darnelle Maffucci B, FNP   40 mg at 04/02/20 1738  . buPROPion (WELLBUTRIN XL) 24 hr tablet 300 mg  300 mg Oral Daily Dagar, Anjali, MD   300 mg at 04/03/20 0831  . gabapentin (NEURONTIN) capsule 400 mg  400 mg Oral BID Money, Lowry Ram, FNP   400 mg at 04/03/20 0931  . gabapentin (NEURONTIN) tablet 1,200 mg  1,200 mg Oral QHS Money, Travis B, FNP   1,200 mg at 04/02/20 2213  . glimepiride (AMARYL) tablet 2 mg  2 mg Oral BID AC Dagar, Anjali, MD   2 mg at 04/03/20 0830  . hydrOXYzine (ATARAX/VISTARIL) tablet 25 mg  25 mg Oral TID PRN Honor Junes, MD   25 mg at 04/02/20 0831  . ibuprofen (ADVIL) tablet 600 mg  600 mg Oral Q6H PRN Dagar, Meredith Staggers, MD   600 mg at 04/03/20 0157  . insulin glargine (LANTUS) injection 30 Units  30 Units Subcutaneous BID Money, Lowry Ram, FNP   30 Units at 04/03/20 1884  . levothyroxine (SYNTHROID) tablet 150 mcg  150 mcg Oral Q0600 Money, Lowry Ram, FNP   150 mcg at 04/03/20 0616  . linagliptin (TRADJENTA) tablet 5 mg  5 mg Oral Daily Money, Lowry Ram, FNP   5 mg at 04/03/20 1660  . liraglutide (VICTOZA) SOPN 1.8 mg  1.8 mg Subcutaneous Daily Sharma Covert, MD   1.8 mg at 04/03/20 1533  . lisinopril (ZESTRIL) tablet 5 mg  5 mg Oral QHS Money, Lowry Ram, FNP   5 mg at 04/02/20 2213  . magnesium hydroxide (MILK OF MAGNESIA) suspension 30 mL  30 mL Oral Daily PRN Money, Darnelle Maffucci B, FNP      . metFORMIN (GLUCOPHAGE) tablet 1,000 mg  1,000 mg Oral BID WC Money, Darnelle Maffucci B, FNP   1,000 mg at 04/03/20 0827  . risperiDONE (RISPERDAL) tablet 2 mg  2 mg Oral QHS Money, Lowry Ram, FNP   2 mg at 04/02/20 2213  . traZODone (DESYREL) tablet 50 mg  50 mg Oral QHS  PRN Dagar, Meredith Staggers,  MD   50 mg at 04/02/20 2213    Lab Results:  Results for orders placed or performed during the hospital encounter of 04/01/20 (from the past 48 hour(s))  Glucose, capillary     Status: Abnormal   Collection Time: 04/03/20  6:29 AM  Result Value Ref Range   Glucose-Capillary 121 (H) 70 - 99 mg/dL    Comment: Glucose reference range applies only to samples taken after fasting for at least 8 hours.    Blood Alcohol level:  Lab Results  Component Value Date   ETH <10 04/15/2535    Metabolic Disorder Labs: Lab Results  Component Value Date   HGBA1C 7.7 (H) 11/11/2015   MPG 174 11/11/2015   MPG 160 07/20/2014   No results found for: PROLACTIN Lab Results  Component Value Date   CHOL 90 03/31/2020   TRIG 149 03/31/2020   HDL 26 (L) 03/31/2020   CHOLHDL 3.5 03/31/2020   VLDL 30 03/31/2020   LDLCALC 34 03/31/2020   LDLCALC 75 07/20/2014    Physical Findings: AIMS: Facial and Oral Movements Muscles of Facial Expression: None, normal Lips and Perioral Area: None, normal Jaw: None, normal Tongue: None, normal,Extremity Movements Upper (arms, wrists, hands, fingers): None, normal Lower (legs, knees, ankles, toes): None, normal, Trunk Movements Neck, shoulders, hips: None, normal, Overall Severity Severity of abnormal movements (highest score from questions above): None, normal Incapacitation due to abnormal movements: None, normal Patient's awareness of abnormal movements (rate only patient's report): No Awareness, Dental Status Current problems with teeth and/or dentures?: No Does patient usually wear dentures?: No  CIWA:    COWS:     Musculoskeletal: Strength & Muscle Tone: within normal limits Gait & Station: normal Patient leans: N/A  Psychiatric Specialty Exam: Physical Exam Vitals and nursing note reviewed.  Constitutional:      Appearance: She is well-developed.  Cardiovascular:     Rate and Rhythm: Normal rate.  Pulmonary:     Effort:  Pulmonary effort is normal.  Neurological:     Mental Status: She is alert and oriented to person, place, and time.     Review of Systems  Constitutional: Negative.   Respiratory: Negative for cough and shortness of breath.   Psychiatric/Behavioral: Negative for agitation, behavioral problems, confusion, decreased concentration, dysphoric mood, hallucinations, self-injury, sleep disturbance and suicidal ideas. The patient is not nervous/anxious and is not hyperactive.     Blood pressure 114/66, pulse 88, temperature (!) 97.5 F (36.4 C), temperature source Oral, resp. rate 16, height 5\' 3"  (1.6 m), weight 104.3 kg, SpO2 97 %.Body mass index is 40.74 kg/m.  General Appearance: Casual  Eye Contact:  Good  Speech:  Normal Rate  Volume:  Normal  Mood:  Euthymic  Affect:  Appropriate and Congruent  Thought Process:  Coherent and Goal Directed  Orientation:  Full (Time, Place, and Person)  Thought Content:  Logical  Suicidal Thoughts:  No  Homicidal Thoughts:  No  Memory:  Immediate;   Good Recent;   Good Remote;   Good  Judgement:  Intact  Insight:  Fair  Psychomotor Activity:  Normal  Concentration:  Concentration: Good and Attention Span: Good  Recall:  Good  Fund of Knowledge:  Fair  Language:  Good  Akathisia:  No  Handed:  Right  AIMS (if indicated):     Assets:  Communication Skills Desire for Improvement Housing Social Support  ADL's:  Intact  Cognition:  WNL  Sleep:  Number of Hours: 4  Treatment Plan Summary: Daily contact with patient to assess and evaluate symptoms and progress in treatment and Medication management   Continue inpatient hospitalization.  Continue Wellutrin XL 300 mg PO daily for depression Continue Risperdal 2 mg PO QHS for intermittent explosiveness Continue gabapentin 400 mg PO BID, 1200 mg PO QHS for chronic pain Continue Lipitor 40 mg PO daily for HLD Continue Amaryl 2 mg PO BID for diabetes Continue Vistaril 25 mg PO TID PRN  anxiety Continue insulin Lantus 30 units SQ BID for diabetes Continue Synthroid 150 mcg PO daily for hypothyroidism Continue Tradjenta 5 mg PO daily for diabetes Continue Victoza 1.8 mg SQ daily for diabetes Continue lisinopril 5 mg PO daily for HTN Continue metformin 1000 mg PO BID for diabetes Continue trazodone 50 mg PO QHS PRN insomnia  Patient will participate in the therapeutic group milieu.  Discharge disposition in progress.   Connye Burkitt, NP 04/03/2020, 3:36 PM

## 2020-04-03 NOTE — Progress Notes (Signed)
Patient rated her day as a 9 out of 10 since she feels that her medications are working. She states that she feels calmer and is less anxious. Her goal for tomorrow is to "stay out of my head".

## 2020-04-04 DIAGNOSIS — E119 Type 2 diabetes mellitus without complications: Secondary | ICD-10-CM

## 2020-04-04 DIAGNOSIS — E042 Nontoxic multinodular goiter: Secondary | ICD-10-CM

## 2020-04-04 DIAGNOSIS — G4733 Obstructive sleep apnea (adult) (pediatric): Secondary | ICD-10-CM

## 2020-04-04 DIAGNOSIS — F6381 Intermittent explosive disorder: Secondary | ICD-10-CM

## 2020-04-04 DIAGNOSIS — F603 Borderline personality disorder: Secondary | ICD-10-CM

## 2020-04-04 DIAGNOSIS — K219 Gastro-esophageal reflux disease without esophagitis: Secondary | ICD-10-CM

## 2020-04-04 DIAGNOSIS — E782 Mixed hyperlipidemia: Secondary | ICD-10-CM

## 2020-04-04 DIAGNOSIS — J301 Allergic rhinitis due to pollen: Secondary | ICD-10-CM

## 2020-04-04 LAB — GLUCOSE, CAPILLARY: Glucose-Capillary: 157 mg/dL — ABNORMAL HIGH (ref 70–99)

## 2020-04-04 MED ORDER — HYDROXYZINE HCL 25 MG PO TABS
25.0000 mg | ORAL_TABLET | Freq: Three times a day (TID) | ORAL | 0 refills | Status: DC | PRN
Start: 2020-04-04 — End: 2022-11-17

## 2020-04-04 MED ORDER — BUPROPION HCL ER (XL) 300 MG PO TB24
300.0000 mg | ORAL_TABLET | Freq: Every day | ORAL | 0 refills | Status: DC
Start: 2020-04-05 — End: 2020-08-03

## 2020-04-04 MED ORDER — TRAZODONE HCL 50 MG PO TABS
50.0000 mg | ORAL_TABLET | Freq: Every evening | ORAL | 0 refills | Status: DC | PRN
Start: 2020-04-04 — End: 2021-04-18

## 2020-04-04 NOTE — Progress Notes (Signed)
   04/04/20 0319  Psych Admission Type (Psych Patients Only)  Admission Status Voluntary  Psychosocial Assessment  Patient Complaints Depression  Eye Contact Fair  Facial Expression Sad  Affect Depressed  Speech Logical/coherent  Interaction Assertive  Motor Activity Other (Comment) (WDL)  Appearance/Hygiene Unremarkable  Behavior Characteristics Cooperative  Mood Depressed;Anxious  Thought Process  Coherency WDL  Content WDL  Delusions None reported or observed  Perception WDL  Hallucination None reported or observed  Judgment WDL  Confusion None  Danger to Self  Current suicidal ideation? Denies  Danger to Others  Danger to Others None reported or observed

## 2020-04-04 NOTE — Progress Notes (Signed)
  [facility] Adult Case Management Discharge Plan :  Will you be returning to the same living situation after discharge:  Yes,  going back to live with wife At discharge, do you have transportation home?: Yes,  my wife this afternoon Do you have the ability to pay for your medications: No.  Release of information consent forms completed and in the chart;  Patient's signature needed at discharge.  Patient to Follow up at:  Follow-up Information    [facility] Follow up.   Why: pt shared that she has an appt with her therapist on 04/22/20. Pt is encouraged to get an earlier appt if able.  Contact information: [address], [place], [place] [address] [phone]       [facility] Follow up.   Why: Continue attending for medication management needs Contact information: [place]  [place] [address] [phone]               Next level of care provider has access to [place] and Suicide Prevention discussed: Yes,  by another CSW with Wife Rhonda Higgins     Has patient been referred to the Quitline?: N/A not a smoker  Patient has been referred for addiction treatment: [place], LCSW 04/04/2020, 9:31 AM

## 2020-04-04 NOTE — [facility] Group Notes (Addendum)
LCSW Group Therapy Note  04/04/2020   10:00-11:00am   Type of Therapy and Topic:  Group Therapy: Anger Cues and Responses  Participation Level:  Minimal    Description of Group:   In this group, patients learned how to recognize the physical, cognitive, emotional, and behavioral responses they have to anger-provoking situations.  They identified a recent time they became angry and how they reacted.  They analyzed how their reaction was possibly beneficial and how it was possibly unhelpful.  The group discussed a variety of healthier coping skills that could help with such a situation in the future.  Focus was placed on how helpful it is to recognize the underlying emotions to our anger, because working on those can lead to a more permanent solution as well as our ability to focus on the important rather than the urgent.  Therapeutic Goals: 1. Patients will remember their last incident of anger and how they felt emotionally and physically, what their thoughts were at the time, and how they behaved. 2. Patients will identify how their behavior at that time worked for them, as well as how it worked against them. 3. Patients will explore possible new behaviors to use in future anger situations. 4. Patients will learn that anger itself is normal and cannot be eliminated, and that healthier reactions can assist with resolving conflict rather than worsening situations.  Summary of Patient Progress:  Patient was present for groups. Gave good eye contact. Pt shared she last felt angry 4 days ago but did not elaborate. Pt did not share again in group. Pt reports at the end of group that she is not depressed but that it made her anxious and there was too many people in the room.   Therapeutic Modalities:   Cognitive Behavioral Therapy  Tye Savoy

## 2020-04-04 NOTE — Discharge Summary (Signed)
Physician Discharge Summary Note  Patient:  Rhonda Higgins is an 49 y.o., female MRN:  [id number] DOB:  1970/11/18 Patient phone:  [phone] (home)  Patient address:   [address] [place] [address],  Total Time spent with patient: 30 minutes  Date of Admission:  04/01/2020 Date of Discharge: 04/04/2020  Reason for Admission:  Rhonda Higgins is a 82 yo F who presented voluntarily to [facility] on 04/01/2020 with recurring suicidal thoughts and worsening depression * 1 week. She was transferred to [facility] for continuous assessment and then admitted voluntarily to [facility] for continued suicidal ideations on 04/01/2020. She was admitted for stabilization and further evaluation.  Principal Problem: MDD (major depressive disorder), recurrent episode, severe ([place]) Discharge Diagnoses: Principal Problem:   MDD (major depressive disorder), recurrent episode, severe ([place]) Active Problems:   HLD (hyperlipidemia)   Obstructive sleep apnea   Allergic rhinitis   History of uterine cancer   Diabetes mellitus without complication (HCC)   GERD (gastroesophageal reflux disease)   Multinodular goiter (nontoxic)   Cholelithiasis with chronic cholecystitis   Borderline personality disorder ([place])   Intermittent explosive disorder in adult   Past Psychiatric History: She reports past psychiatric diagnosis of MDD, IED, Borderline personality disorder, OCPD and follows [facility] for her medication management. She adds that she has been struggling with depression when she was22 years old and she was admitted for 71month when she was 36 and got diagnosed with psychiatric illnesses.She has multiple suicide attempts ( b/w age of 36 & 50) around [age]- by attempting to jump off a bridge, slitting her wrist,  as well as overdoses. Her last psychiatric hospitalization was when she was 40. She has a history of self harm by cutting with razor but that was also b/w age of [age]. Around 10 years ago she  started taking Risperdal doe her anger management and states it works great for her. She was started on Lamictal 10 years ago when she was ruminating about dead people but stopped taking it long time ago. She started taking 150 mg of Wellbutrin 6 years ago.   Past Medical History:  Past Medical History:  Diagnosis Date  . Adenocarcinoma (HCC)    endometrial, FIGO GRADE 1  . Allergic rhinitis   . Atypical chest pain    History of  . Depression   . Elevated liver enzymes   . GERD (gastroesophageal reflux disease)   . Hematuria   . History of endometrial cancer 08-02-2009   oncologist-  dr brewster/ Denman George and dr kinard/  no recurrence   endometrial adenocarinoma Stage 1B, Grade 1, FIGO--  s/p  TAH w/ BSO and pelvic lymph node dissection's and radiation therapy  . History of kidney stones   . History of radiation therapy    2011  pelvic intracavity brachytherapy treatment's for endometrial carcinoma  . History of thyroid nodule    multinodular goiter s/p  total thyroidectomy 11-19-2015  per pathology -  adenomatoid nodules  . Hyperlipidemia   . Hypothyroidism, postsurgical   . Insulin dependent diabetes mellitus    Type 2  . Left ureteral stone   . Obesity   . OSA (obstructive sleep apnea)    severe OSA  per study 03-08-2010--  noncomplant cpap (no more sleep apnes since lost weight 2019)  . Overactive bladder   . Personality disorder ([place])   . Polyphagia(783.6)   . PONV (postoperative nausea and vomiting)    after ear surgery only one time  . Right lower  quadrant pain   . Urgency of urination   . UTI (urinary tract infection)     Past Surgical History:  Procedure Laterality Date  . CARDIOVASCULAR STRESS TEST  06/09/2008   normal nuclear study w/ no ischemia/  normal LV function and wall motion , ef 83%  . CHOLECYSTECTOMY N/A 09/06/2017   Procedure: LAPAROSCOPIC CHOLECYSTECTOMY WITH INTRAOPERATIVE CHOLANGIOGRAM;  Surgeon: Armandina Gemma, MD;  Location: [facility];  Service: General;   Laterality: N/A;  . CYSTOSCOPY/RETROGRADE/URETEROSCOPY/STONE EXTRACTION WITH BASKET Left 03/08/2016   Procedure: CYSTOSCOPY/RETROGRADE/URETEROSCOPY/STONE EXTRACTION WITH BASKET, STENT PLACEMENT;  Surgeon: Rana Snare, MD;  [facility];  Service: Urology;  Laterality: Left;  . ENDOMETRIAL BIOPSY    . ERCP N/A 09/07/2017   Procedure: ENDOSCOPIC RETROGRADE CHOLANGIOPANCREATOGRAPHY (ERCP);  Surgeon: [name], MD;  Location: [facility];  Service: Gastroenterology;  Laterality: N/A;  . ESOPHAGOGASTRODUODENOSCOPY (EGD) WITH PROPOFOL N/A 03/16/2020   Procedure: ESOPHAGOGASTRODUODENOSCOPY (EGD) WITH PROPOFOL;  Surgeon: [name], MD;  Location: [facility];  Service: Gastroenterology;  Laterality: N/A;  unable to locate ampulla, aborted ERCP, changed to EGD  . HOLMIUM LASER APPLICATION Left 8/84/1660   Procedure: HOLMIUM LASER APPLICATION;  Surgeon: Rana Snare, MD;  Location: [facility];  Service: Urology;  Laterality: Left;  . MOUTH SURGERY    . MYRINGECOTMY W/ REMOVAL MIDDLE EAR CHOLESTEATOMA TYPE 1 FASICA TYMPANOPLASTY  09/13/2000  . ROBOTIC ASSISTED TOTAL HYSTERECTOMY WITH BILATERAL SALPINGO OOPHERECTOMY  08-02-2009   at [facility]  dr Denman George   w/  Bilateral pelvic and para aortic lymph node dissection's  . THYROIDECTOMY N/A 11/19/2015   Procedure: TOTAL THYROIDECTOMY;  Surgeon: Armandina Gemma, MD;  Location: [facility];  Service: General;  Laterality: N/A;  . TONSILLECTOMY  age 58  . TRANSTHORACIC ECHOCARDIOGRAM  07/19/2014   ef 55-60%/  trivial TR  . TYMPANOPLASTY Right 1993   Family History:  Family History  Problem Relation Age of Onset  . Heart disease Sister   . Heart attack Brother   . Heart disease Brother   . Heart disease Sister   . Diabetes Sister   . Breast cancer Sister   . Asthma Mother   . Heart disease Mother   . Diabetes Mother   . Emphysema Mother   . Hypertension Mother   . Stroke Mother   . Prostate cancer Father    Family  Psychiatric  History: Non- pertinent Social History:  Social History   Substance and Sexual Activity  Alcohol Use No     Social History   Substance and Sexual Activity  Drug Use No    Social History   Socioeconomic History  . Marital status: Married    Spouse name: Not on file  . Number of children: N  . Years of education: 10  . Highest education level: 10th grade  Occupational History  . Occupation: n/a  Tobacco Use  . Smoking status: Never Smoker  . Smokeless tobacco: Never Used  Vaping Use  . Vaping Use: Never used  Substance and Sexual Activity  . Alcohol use: No  . Drug use: No  . Sexual activity: Yes    Birth control/protection: None  Other Topics Concern  . Not on file  Social History Narrative   Lives with partner, [name].   Social Determinants of Health   Financial Resource Strain:   . Difficulty of Paying Living Expenses: Not on file  Food Insecurity:   . Worried About Charity fundraiser in the Last Year: Not on  file  . [place] in the Last Year: Not on file  Transportation Needs:   . Lack of Transportation (Medical): Not on file  . Lack of Transportation (Non-Medical): Not on file  Physical Activity:   . Days of Exercise per Week: Not on file  . Minutes of Exercise per Session: Not on file  Stress:   . Feeling of Stress : Not on file  Social Connections:   . Frequency of Communication with Friends and Family: Not on file  . Frequency of Social Gatherings with Friends and Family: Not on file  . Attends Religious Services: Not on file  . Active Member of Clubs or Organizations: Not on file  . Attends Archivist Meetings: Not on file  . Marital Status: Not on file    Hospital Course:  Patient was continued on home medications. Her Wellbutrin was increased to 300 mg to help with the mood. She was encouraged to attend group therapies.  She presented with improved mood and affect.  On the day of discharge she denies suicidal  ideations, homicidal ideations, auditory or visual hallucinations.  Her wife Stephani Police was called and she agrees with patient's discharge to home. She denies any access of guns at home. Patient is appreciative of all the help received and is leaving the unit in good spirits. She states she learned mindfulness to help cope with difficult situations in future.   Physical Findings: AIMS: Facial and Oral Movements Muscles of Facial Expression: None, normal Lips and Perioral Area: None, normal Jaw: None, normal Tongue: None, normal,Extremity Movements Upper (arms, wrists, hands, fingers): None, normal Lower (legs, knees, ankles, toes): None, normal, Trunk Movements Neck, shoulders, hips: None, normal, Overall Severity Severity of abnormal movements (highest score from questions above): None, normal Incapacitation due to abnormal movements: None, normal Patient's awareness of abnormal movements (rate only patient's report): No Awareness, Dental Status Current problems with teeth and/or dentures?: No Does patient usually wear dentures?: No  CIWA:    COWS:     Musculoskeletal: Strength & Muscle Tone: within normal limits Gait & Station: normal Patient leans: N/A  Psychiatric Specialty Exam: Physical Exam Vitals and nursing note reviewed.  Constitutional:      Appearance: She is well-developed.  Cardiovascular:     Rate and Rhythm: Normal rate.  Pulmonary:     Effort: Pulmonary effort is normal.  Musculoskeletal:        General: Tenderness present.     Cervical back: Normal range of motion.  Neurological:     Mental Status: She is alert and oriented to person, place, and time.     Review of Systems  Constitutional: Negative.   HENT: Negative.   Respiratory: Negative for cough and shortness of breath.   Musculoskeletal: Positive for back pain.  Neurological: Negative.   Psychiatric/Behavioral: Negative for agitation, behavioral problems, confusion, decreased concentration, dysphoric  mood, hallucinations, self-injury, sleep disturbance and suicidal ideas. The patient is not nervous/anxious and is not hyperactive.     Blood pressure 114/71, pulse 99, temperature 98.1 F (36.7 C), temperature source Oral, resp. rate 16, height 5\' 3"  (1.6 m), weight 104.3 kg, SpO2 97 %.Body mass index is 40.74 kg/m.  General Appearance: Casual  Eye Contact:  Good  Speech:  Normal Rate  Volume:  Normal  Mood:  Euthymic  Affect:  Appropriate  Thought Process:  Linear and Descriptions of Associations: Intact  Orientation:  Full (Time, Place, and Person)  Thought Content:  Logical  Suicidal Thoughts:  No  Homicidal Thoughts:  No  Memory:  Immediate;   Good Recent;   Good Remote;   Good  Judgement:  Intact  Insight:  Fair  Psychomotor Activity:  Normal  Concentration:  Concentration: Good and Attention Span: Good  Recall:  Good  Fund of Knowledge:  Good  Language:  Good  Akathisia:  Negative  Handed:  Right  AIMS (if indicated):     Assets:  Communication Skills Desire for Improvement Financial Resources/Insurance Housing Resilience Social Support Transportation  ADL's:  Intact  Cognition:  WNL  Sleep:  Number of Hours: 4.5        Has this patient used any form of tobacco in the last 30 days? (Cigarettes, Smokeless Tobacco, Cigars, and/or Pipes) Yes, N/A  Blood Alcohol level:  Lab Results  Component Value Date   ETH <10 56/81/2751    Metabolic Disorder Labs:  Lab Results  Component Value Date   HGBA1C 7.7 (H) 11/11/2015   MPG 174 11/11/2015   MPG 160 07/20/2014   No results found for: PROLACTIN Lab Results  Component Value Date   CHOL 90 03/31/2020   TRIG 149 03/31/2020   HDL 26 (L) 03/31/2020   CHOLHDL 3.5 03/31/2020   VLDL 30 03/31/2020   LDLCALC 34 03/31/2020   [place] 75 07/20/2014    See Psychiatric Specialty Exam and Suicide Risk Assessment completed by Attending Physician prior to discharge.  Discharge destination:  Home  Is patient on  multiple antipsychotic therapies at discharge:  No   Has Patient had three or more failed trials of antipsychotic monotherapy by history:  No  Recommended Plan for Multiple Antipsychotic Therapies: NA  Discharge Instructions    Discharge patient   Complete by: As directed    Discharge disposition: 01-Home or Self Care   Discharge patient date: 04/04/2020     Allergies as of 04/04/2020      Reactions   Hydrocodone Shortness Of Breath, Itching   Tape Rash   Paper tape is ok   Codeine Itching      Medication List    TAKE these medications     Indication  atorvastatin 40 MG tablet Commonly known as: LIPITOR Take 40 mg by mouth every evening.  Indication: High Amount of Fats in the Blood   buPROPion 300 MG 24 hr tablet Commonly known as: WELLBUTRIN XL Take 1 tablet (300 mg total) by mouth daily. Start taking on: April 05, 2020 What changed:   medication strength  how much to take  Indication: Major Depressive Disorder   gabapentin 400 MG capsule Commonly known as: NEURONTIN Take 3 capsules (1,200 mg total) by mouth at bedtime.  Indication: Neuropathic Pain   gabapentin 400 MG capsule Commonly known as: NEURONTIN Take 1 capsule (400 mg total) by mouth 2 (two) times daily at 10 am and 4 pm.  Indication: Disease of the Peripheral Nerves   glimepiride 4 MG tablet Commonly known as: AMARYL Take 4 mg by mouth 2 (two) times daily.  Indication: Type 2 Diabetes   hydrOXYzine 25 MG tablet Commonly known as: ATARAX/VISTARIL Take 1 tablet (25 mg total) by mouth 3 (three) times daily as needed for anxiety.  Indication: Feeling Anxious   insulin glargine 100 UNIT/ML injection Commonly known as: LANTUS Inject 0.3 mLs (30 Units total) into the skin 2 (two) times daily.  Indication: Type 2 Diabetes   linagliptin 5 MG Tabs tablet Commonly known as: TRADJENTA Take 1 tablet (5 mg total) by mouth daily with breakfast.  Indication: Type 2 Diabetes   lisinopril 5 MG  tablet Commonly known as: ZESTRIL Take 5 mg by mouth at bedtime.  Indication: kidney protection for DM   metFORMIN 1000 MG tablet Commonly known as: GLUCOPHAGE Take 1 tablet (1,000 mg total) by mouth 2 (two) times daily with a meal.  Indication: Type 2 Diabetes   pantoprazole 40 MG tablet Commonly known as: PROTONIX Take 40 mg by mouth 2 (two) times daily.  Indication: Gastroesophageal Reflux Disease   risperiDONE 2 MG tablet Commonly known as: RISPERDAL Take 2 mg by mouth at bedtime.  Indication: Psychomotor Agitation   Synthroid 150 MCG tablet Generic drug: levothyroxine Take 150 mcg by mouth daily.  Indication: Underactive Thyroid   traZODone 50 MG tablet Commonly known as: DESYREL Take 1 tablet (50 mg total) by mouth at bedtime as needed for sleep.  Indication: Trouble Sleeping   Victoza 18 MG/3ML Sopn Generic drug: liraglutide Inject 1.8 mg into the skin daily.  Indication: Type 2 Diabetes       Follow-up Information    [facility] Follow up.   Why: pt shared that she has an appt with her therapist on 04/22/20. Pt is encouraged to get an earlier appt if able.  Contact information: [address], [place], [place] [address] [phone]       [facility] Follow up.   Why: Continue attending for medication management needs Contact information: [place]  [place] [address] [phone]               Follow-up recommendations:  Activity:  Normal Diet:  Heart Healthy diet  Comments:  Prescriptions given at discharge.Patient agreeable to plan. Given opportunity to ask questions. Appears to feel comfortable with discharge denies any current suicidal or homicidal thought. Patient is also instructed prior to discharge to: Take all medications as prescribed by her mental healthcare provider. Report any adverse effects and or reactions from the medicines to her outpatient provider promptly. Patient has been instructed &  cautioned: To not engage in alcohol and or illegal drug use while on prescription medicines. In the event of worsening symptoms, patient is instructed to call the crisis hotline, 911 and or go to the nearest ED for appropriate evaluation and treatment of symptoms. To follow-up with her primary care provider for your other medical issues, concerns and or health care needs  Signed: Honor Junes, MD 04/04/2020, 11:41 AM

## 2020-04-04 NOTE — Progress Notes (Signed)
Discharge Note:  Patient denies SI/HI AVH at this time. Discharge instructions, AVS, prescriptions and transition record gone over with patient. Patient agrees to comply with medication management, follow-up visit, and outpatient therapy. Patient belongings returned to patient. Patient questions and concerns addressed and answered.  Patient ambulatory off unit.  Patient discharged to home with friend.   
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
## 2020-04-05 LAB — HEMOGLOBIN A1C
Hgb A1c MFr Bld: 7.4 % — ABNORMAL HIGH (ref 4.8–5.6)
Mean Plasma Glucose: 166 mg/dL

## 2020-04-26 ENCOUNTER — Encounter: Payer: Self-pay | Admitting: Podiatry

## 2020-04-26 ENCOUNTER — Other Ambulatory Visit: Payer: Self-pay

## 2020-04-26 ENCOUNTER — Ambulatory Visit (INDEPENDENT_AMBULATORY_CARE_PROVIDER_SITE_OTHER): Payer: Medicaid Other | Admitting: Podiatry

## 2020-04-26 DIAGNOSIS — E119 Type 2 diabetes mellitus without complications: Secondary | ICD-10-CM

## 2020-04-26 DIAGNOSIS — E1142 Type 2 diabetes mellitus with diabetic polyneuropathy: Secondary | ICD-10-CM

## 2020-04-26 DIAGNOSIS — M792 Neuralgia and neuritis, unspecified: Secondary | ICD-10-CM

## 2020-04-26 DIAGNOSIS — M79675 Pain in left toe(s): Secondary | ICD-10-CM | POA: Diagnosis not present

## 2020-04-26 DIAGNOSIS — B351 Tinea unguium: Secondary | ICD-10-CM

## 2020-04-26 DIAGNOSIS — M79674 Pain in right toe(s): Secondary | ICD-10-CM | POA: Diagnosis not present

## 2020-04-27 NOTE — Progress Notes (Signed)
ANNUAL DIABETIC FOOT EXAM  Subjective: Rhonda Higgins presents today for for annual diabetic foot examination and painful thick toenails that are difficult to trim. Pain interferes with ambulation. Aggravating factors include wearing enclosed shoe gear. Pain is relieved with periodic professional debridement..  Patient relates 10 year h/o diabetes.  Patient denies any h/o foot wounds.  Patient admits symptoms of foot numbness.  Patient admits symptoms of foot tingling.  Patient admits symptoms of burning in feet. She takes Neurontin for neuropathic pain.  Patient's blood sugar was 137 mg/dl this morning.   She informs me on today that her mother in law passed away.   Rhonda Cowman, MD is patient's PCP. Last visit was 1.5 weeks ago.  Past Medical History:  Diagnosis Date  . Adenocarcinoma (HCC)    endometrial, FIGO GRADE 1  . Allergic rhinitis   . Atypical chest pain    History of  . Depression   . Elevated liver enzymes   . GERD (gastroesophageal reflux disease)   . Hematuria   . History of endometrial cancer 08-02-2009   oncologist-  dr Rhonda Higgins/ Rhonda Higgins and dr Rhonda Higgins/  no recurrence   endometrial adenocarinoma Stage 1B, Grade 1, FIGO--  s/p  TAH w/ BSO and pelvic lymph node dissection's and radiation therapy  . History of kidney stones   . History of radiation therapy    2011  pelvic intracavity brachytherapy treatment's for endometrial carcinoma  . History of thyroid nodule    multinodular goiter s/p  total thyroidectomy 11-19-2015  per pathology -  adenomatoid nodules  . Hyperlipidemia   . Hypothyroidism, postsurgical   . Insulin dependent diabetes mellitus    Type 2  . Left ureteral stone   . Obesity   . OSA (obstructive sleep apnea)    severe OSA  per study 03-08-2010--  noncomplant cpap (no more sleep apnes since lost weight 2019)  . Overactive bladder   . Personality disorder (Rhonda Higgins)   . Polyphagia(783.6)   . PONV (postoperative nausea and vomiting)     after ear surgery only one time  . Right lower quadrant pain   . Urgency of urination   . UTI (urinary tract infection)    Patient Active Problem List   Diagnosis Date Noted  . MDD (major depressive disorder), recurrent episode, severe ([place]) 04/01/2020  . Borderline personality disorder ([place]) 04/01/2020  . Intermittent explosive disorder in adult 04/01/2020  . Cholelithiasis with chronic cholecystitis 09/06/2017  . Multinodular goiter (nontoxic) 11/17/2015  . Diabetes mellitus without complication ([place]) 37/03/6268  . GERD (gastroesophageal reflux disease) 07/19/2014  . HLD (hyperlipidemia) 02/09/2010  . Obstructive sleep apnea 02/09/2010  . Allergic rhinitis 02/09/2010  . History of uterine cancer 02/09/2010   Past Surgical History:  Procedure Laterality Date  . CARDIOVASCULAR STRESS TEST  06/09/2008   normal nuclear study w/ no ischemia/  normal LV function and wall motion , ef 83%  . CHOLECYSTECTOMY N/A 09/06/2017   Procedure: LAPAROSCOPIC CHOLECYSTECTOMY WITH INTRAOPERATIVE CHOLANGIOGRAM;  Surgeon: Armandina Gemma, MD;  Location: [facility];  Service: General;  Laterality: N/A;  . CYSTOSCOPY/RETROGRADE/URETEROSCOPY/STONE EXTRACTION WITH BASKET Left 03/08/2016   Procedure: CYSTOSCOPY/RETROGRADE/URETEROSCOPY/STONE EXTRACTION WITH BASKET, STENT PLACEMENT;  Surgeon: Rana Snare, MD;  [facility];  Service: Urology;  Laterality: Left;  . ENDOMETRIAL BIOPSY    . ERCP N/A 09/07/2017   Procedure: ENDOSCOPIC RETROGRADE CHOLANGIOPANCREATOGRAPHY (ERCP);  Surgeon: [name], MD;  Location: [facility];  Service: Gastroenterology;  Laterality: N/A;  . ESOPHAGOGASTRODUODENOSCOPY (EGD) WITH PROPOFOL N/A  03/16/2020   Procedure: ESOPHAGOGASTRODUODENOSCOPY (EGD) WITH PROPOFOL;  Surgeon: [name], MD;  Location: [facility];  Service: Gastroenterology;  Laterality: N/A;  unable to locate ampulla, aborted ERCP, changed to EGD  . HOLMIUM LASER APPLICATION Left 1/61/0960    Procedure: HOLMIUM LASER APPLICATION;  Surgeon: Rana Snare, MD;  Location: [facility];  Service: Urology;  Laterality: Left;  . MOUTH SURGERY    . MYRINGECOTMY W/ REMOVAL MIDDLE EAR CHOLESTEATOMA TYPE 1 FASICA TYMPANOPLASTY  09/13/2000  . ROBOTIC ASSISTED TOTAL HYSTERECTOMY WITH BILATERAL SALPINGO OOPHERECTOMY  08-02-2009   at [facility]  dr Rhonda Higgins   w/  Bilateral pelvic and para aortic lymph node dissection's  . THYROIDECTOMY N/A 11/19/2015   Procedure: TOTAL THYROIDECTOMY;  Surgeon: Armandina Gemma, MD;  Location: [facility];  Service: General;  Laterality: N/A;  . TONSILLECTOMY  age 80  . TRANSTHORACIC ECHOCARDIOGRAM  07/19/2014   ef 55-60%/  trivial TR  . TYMPANOPLASTY Right 1993   Current Outpatient Medications on File Prior to Visit  Medication Sig Dispense Refill  . ACCU-CHEK GUIDE test strip     . atorvastatin (LIPITOR) 40 MG tablet Take 40 mg by mouth every evening.     [name] buPROPion (WELLBUTRIN XL) 300 MG 24 hr tablet Take 1 tablet (300 mg total) by mouth daily. 30 tablet 0  . gabapentin (NEURONTIN) 400 MG capsule Take 3 capsules (1,200 mg total) by mouth at bedtime.    . gabapentin (NEURONTIN) 400 MG capsule Take 1 capsule (400 mg total) by mouth 2 (two) times daily at 10 am and 4 pm.    . glimepiride (AMARYL) 4 MG tablet Take 4 mg by mouth 2 (two) times daily.   5  . hydrOXYzine (ATARAX/VISTARIL) 25 MG tablet Take 1 tablet (25 mg total) by mouth 3 (three) times daily as needed for anxiety. 90 tablet 0  . LANTUS SOLOSTAR 100 UNIT/ML Solostar Pen Inject into the skin.    [name] linagliptin (TRADJENTA) 5 MG TABS tablet Take 1 tablet (5 mg total) by mouth daily with breakfast. 30 tablet   . liraglutide (VICTOZA) 18 MG/3ML SOPN Inject 1.8 mg into the skin daily.    [name] lisinopril (PRINIVIL,ZESTRIL) 5 MG tablet Take 5 mg by mouth at bedtime.     . metFORMIN (GLUCOPHAGE) 1000 MG tablet Take 1 tablet (1,000 mg total) by mouth 2 (two) times daily with a meal.    . nitrofurantoin,  macrocrystal-monohydrate, (MACROBID) 100 MG capsule Take 100 mg by mouth 2 (two) times daily.    . pantoprazole (PROTONIX) 40 MG tablet Take 40 mg by mouth 2 (two) times daily.   0  . risperiDONE (RISPERDAL) 2 MG tablet Take 2 mg by mouth at bedtime.   2  . SYNTHROID 150 MCG tablet Take 150 mcg by mouth daily.    . traZODone (DESYREL) 50 MG tablet Take 1 tablet (50 mg total) by mouth at bedtime as needed for sleep. 30 tablet 0   No current facility-administered medications on file prior to visit.    Allergies  Allergen Reactions  . Hydrocodone Shortness Of Breath and Itching  . Tape Rash    Paper tape is ok  . Codeine Itching   Social History   Occupational History  . Occupation: n/a  Tobacco Use  . Smoking status: Never Smoker  . Smokeless tobacco: Never Used  Vaping Use  . Vaping Use: Never used  Substance and Sexual Activity  . Alcohol use: No  . Drug use: No  . Sexual  activity: Yes    Birth control/protection: None   Family History  Problem Relation Age of Onset  . Heart disease Sister   . Heart attack Brother   . Heart disease Brother   . Heart disease Sister   . Diabetes Sister   . Breast cancer Sister   . Asthma Mother   . Heart disease Mother   . Diabetes Mother   . Emphysema Mother   . Hypertension Mother   . Stroke Mother   . Prostate cancer Father    There is no immunization history for the selected administration types on file for this patient.   Review of Systems: Negative except as noted in the HPI.  Objective: There were no vitals filed for this visit.  Rhonda Higgins is a pleasant 49 y.o. female in NAD. AAO X 3.  Vascular Examination: Capillary refill time to digits immediate b/l. Palpable pedal pulses b/l LE. Pedal hair absent. Lower extremity skin temperature gradient within normal limits.  Dermatological Examination: Pedal skin with normal turgor, texture and tone bilaterally. No open wounds bilaterally. No interdigital macerations  bilaterally. Toenails 1-5 b/l elongated, discolored, dystrophic, thickened, crumbly with subungual debris and tenderness to dorsal palpation.  Musculoskeletal Examination: Normal muscle strength 5/5 to all lower extremity muscle groups bilaterally. No pain crepitus or joint limitation noted with ROM b/l. No gross bony deformities bilaterally.  Footwear Assessment: Does the patient wear appropriate shoes? Yes. Does the patient need inserts/orthotics? No..  Neurological Examination: Pt has subjective symptoms of neuropathy. Protective sensation diminished with 10g monofilament b/l.  Hemoglobin A1C Latest Ref Rng & Units 04/03/2020  HGBA1C 4.8 - 5.6 % 7.4(H)  Some recent data might be hidden   Assessment: 1. Pain due to onychomycosis of toenails of both feet   2. Neuropathic pain   3. Diabetic peripheral neuropathy associated with type 2 diabetes mellitus ([place])   4. Encounter for diabetic foot exam ([place])      ADA Risk Categorization:  High Risk  Patient has one or more of the following: Loss of protective sensation Absent pedal pulses Severe Foot deformity History of foot ulcer  Plan: -Examined patient. -No new findings. No new orders. -Diabetic foot examination performed on today's visit. -Continue diabetic foot care principles. -Patient to continue soft, supportive shoe gear daily. -Toenails 1-5 b/l were debrided in length and girth with sterile nail nippers and dremel without iatrogenic bleeding.  -Patient to report any pedal injuries to medical professional immediately. -Patient/POA to call should there be question/concern in the interim.  Return in about 3 months (around 07/27/2020) for diabetic foot care.  Marzetta Board, DPM

## 2020-04-28 ENCOUNTER — Ambulatory Visit (INDEPENDENT_AMBULATORY_CARE_PROVIDER_SITE_OTHER): Payer: Medicaid Other

## 2020-04-28 ENCOUNTER — Ambulatory Visit (INDEPENDENT_AMBULATORY_CARE_PROVIDER_SITE_OTHER): Payer: Medicaid Other | Admitting: Orthopedic Surgery

## 2020-04-28 DIAGNOSIS — M792 Neuralgia and neuritis, unspecified: Secondary | ICD-10-CM | POA: Diagnosis not present

## 2020-04-28 DIAGNOSIS — M79602 Pain in left arm: Secondary | ICD-10-CM | POA: Diagnosis not present

## 2020-04-28 MED ORDER — PREDNISONE 5 MG (21) PO TBPK: ORAL_TABLET | ORAL | 0 refills | Status: DC

## 2020-04-28 MED ORDER — METHOCARBAMOL 500 MG PO TABS: ORAL_TABLET | ORAL | 0 refills | Status: DC

## 2020-05-02 ENCOUNTER — Encounter: Payer: Self-pay | Admitting: Orthopedic Surgery

## 2020-05-02 NOTE — Progress Notes (Signed)
Office Visit Note   Patient: Rhonda Higgins           Date of Birth: 02-28-1971           MRN: [id number] Visit Date: 04/28/2020 Requested by: [name], MD [address] [place],  [place] [address] PCP: [name], MD  Subjective: Chief Complaint  Patient presents with  . left arm and neck pain    HPI: Rhonda Higgins is a 49 y.o. female who presents to the office complaining of left shoulder pain.  Patient notes that she has been dealing with worsening left shoulder pain over the last 10 days.  She cannot recall an injury specifically but she did move from her apartment to a house about 2 weeks ago.  She denies any weakness of the shoulder.  She localizes the majority of her pain to the left neck and trapezius with radiation into the shoulder blade.  Pain causes her to wake up at night with pain.  She has had similar pain in the past but this is much worse than it has been in the past.  Pain is worse with moving her neck around.  She has tried Biofreeze, Motrin, Tylenol without any significant relief.  She has no history of left shoulder surgery or neck surgery..                ROS: All systems reviewed are negative as they relate to the chief complaint within the history of present illness.  Patient denies fevers or chills.  Assessment & Plan: Visit Diagnoses:  1. Left arm pain   2. Radicular pain in left arm     Plan: Patient is a 49 year old female presents complaining of left shoulder pain.  Denies any injury but she did move from an apartment to her house which involves a lot of lifting.  This is about 2 weeks ago.  Majority of her pain is radiating down from the trap into the shoulder blade and down the arm.  No weakness on exam.  Rotator cuff with excellent strength and left shoulder with excellent range of motion.  Impression is that the pain is referred pain from her cervical spine.  Prescribed Robaxin and steroid Dosepak for symptomatic relief.  Plan for  patient to follow-up in 4 weeks for clinical recheck.  If no improvement at that time or any weakness on exam, consider MRI scan of the cervical spine.  Patient agreed with plan.  Follow-Up Instructions: No follow-ups on file.   Orders:  Orders Placed This Encounter  Procedures  . XR Shoulder Left  . XR Cervical Spine 2 or 3 views   Meds ordered this encounter  Medications  . methocarbamol (ROBAXIN) 500 MG tablet    Sig: 1 po q 8 prn    Dispense:  30 tablet    Refill:  0  . predniSONE (STERAPRED UNI-PAK 21 TAB) 5 MG (21) TBPK tablet    Sig: Take dosepak as directed    Dispense:  21 tablet    Refill:  0      Procedures: No procedures performed   Clinical Data: No additional findings.  Objective: Vital Signs: There were no vitals taken for this visit.  Physical Exam:  Constitutional: Patient appears well-developed HEENT:  Head: Normocephalic Eyes:EOM are normal Neck: Normal range of motion Cardiovascular: Normal rate Pulmonary/chest: Effort normal Neurologic: Patient is alert Skin: Skin is warm Psychiatric: Patient has normal mood and affect  Ortho Exam: Ortho exam demonstrates left shoulder  with 45 degrees external rotation, 95 degrees abduction, 140 degrees forward flexion.  No crepitus with passive range of motion of the left shoulder.  No pain with range of motion of the shoulder.  Excellent strength of the supraspinatus, infraspinatus, subscapularis.  5/5 motor strength of the bilateral grip, finger abduction, pronation/supination, bicep, tricep, deltoid.  Tender to palpation over the cervical spine and paraspinal musculature of the left neck.  Tender to palpation over the left AC joint.  No crossarm adduction pain.  Pain is worse with terminal cervical spine range of motion and causes radicular pain down the left arm.  Specialty Comments:  No specialty comments available.  Imaging: No results found.   PMFS History: Patient Active Problem List   Diagnosis  Date Noted  . MDD (major depressive disorder), recurrent episode, severe ([place]) 04/01/2020  . Borderline personality disorder ([place]) 04/01/2020  . Intermittent explosive disorder in adult 04/01/2020  . Cholelithiasis with chronic cholecystitis 09/06/2017  . Multinodular goiter (nontoxic) 11/17/2015  . Diabetes mellitus without complication ([place]) 23/76/2831  . GERD (gastroesophageal reflux disease) 07/19/2014  . HLD (hyperlipidemia) 02/09/2010  . Obstructive sleep apnea 02/09/2010  . Allergic rhinitis 02/09/2010  . History of uterine cancer 02/09/2010   Past Medical History:  Diagnosis Date  . Adenocarcinoma (HCC)    endometrial, FIGO GRADE 1  . Allergic rhinitis   . Atypical chest pain    History of  . Depression   . Elevated liver enzymes   . GERD (gastroesophageal reflux disease)   . Hematuria   . History of endometrial cancer 08-02-2009   oncologist-  dr brewster/ Denman George and dr kinard/  no recurrence   endometrial adenocarinoma Stage 1B, Grade 1, FIGO--  s/p  TAH w/ BSO and pelvic lymph node dissection's and radiation therapy  . History of kidney stones   . History of radiation therapy    2011  pelvic intracavity brachytherapy treatment's for endometrial carcinoma  . History of thyroid nodule    multinodular goiter s/p  total thyroidectomy 11-19-2015  per pathology -  adenomatoid nodules  . Hyperlipidemia   . Hypothyroidism, postsurgical   . Insulin dependent diabetes mellitus    Type 2  . Left ureteral stone   . Obesity   . OSA (obstructive sleep apnea)    severe OSA  per study 03-08-2010--  noncomplant cpap (no more sleep apnes since lost weight 2019)  . Overactive bladder   . Personality disorder ([place])   . Polyphagia(783.6)   . PONV (postoperative nausea and vomiting)    after ear surgery only one time  . Right lower quadrant pain   . Urgency of urination   . UTI (urinary tract infection)     Family History  Problem Relation Age of Onset  . Heart disease Sister     . Heart attack Brother   . Heart disease Brother   . Heart disease Sister   . Diabetes Sister   . Breast cancer Sister   . Asthma Mother   . Heart disease Mother   . Diabetes Mother   . Emphysema Mother   . Hypertension Mother   . Stroke Mother   . Prostate cancer Father     Past Surgical History:  Procedure Laterality Date  . CARDIOVASCULAR STRESS TEST  06/09/2008   normal nuclear study w/ no ischemia/  normal LV function and wall motion , ef 83%  . CHOLECYSTECTOMY N/A 09/06/2017   Procedure: LAPAROSCOPIC CHOLECYSTECTOMY WITH INTRAOPERATIVE CHOLANGIOGRAM;  Surgeon: Armandina Gemma, MD;  Location: [facility];  Service: General;  Laterality: N/A;  . CYSTOSCOPY/RETROGRADE/URETEROSCOPY/STONE EXTRACTION WITH BASKET Left 03/08/2016   Procedure: CYSTOSCOPY/RETROGRADE/URETEROSCOPY/STONE EXTRACTION WITH BASKET, STENT PLACEMENT;  Surgeon: Rana Snare, MD;  Location: [facility];  Service: Urology;  Laterality: Left;  . ENDOMETRIAL BIOPSY    . ERCP N/A 09/07/2017   Procedure: ENDOSCOPIC RETROGRADE CHOLANGIOPANCREATOGRAPHY (ERCP);  Surgeon: [name], MD;  Location: [facility];  Service: Gastroenterology;  Laterality: N/A;  . ESOPHAGOGASTRODUODENOSCOPY (EGD) WITH PROPOFOL N/A 03/16/2020   Procedure: ESOPHAGOGASTRODUODENOSCOPY (EGD) WITH PROPOFOL;  Surgeon: [name], MD;  [facility]: [facility];  Service: Gastroenterology;  Laterality: N/A;  unable to locate ampulla, aborted ERCP, changed to EGD  . HOLMIUM LASER APPLICATION Left 06/29/5518   Procedure: HOLMIUM LASER APPLICATION;  Surgeon: Rana Snare, MD;  Location: [facility];  Service: Urology;  Laterality: Left;  . MOUTH SURGERY    . MYRINGECOTMY W/ REMOVAL MIDDLE EAR CHOLESTEATOMA TYPE 1 FASICA TYMPANOPLASTY  09/13/2000  . ROBOTIC ASSISTED TOTAL HYSTERECTOMY WITH BILATERAL SALPINGO OOPHERECTOMY  08-02-2009   at [facility]  dr Denman George   w/  Bilateral pelvic and para aortic lymph node dissection's  . THYROIDECTOMY N/A  11/19/2015   Procedure: TOTAL THYROIDECTOMY;  Surgeon: Armandina Gemma, MD;  Location: [facility];  Service: General;  Laterality: N/A;  . TONSILLECTOMY  age 39  . TRANSTHORACIC ECHOCARDIOGRAM  07/19/2014   ef 55-60%/  trivial TR  . TYMPANOPLASTY Right 1993   Social History   Occupational History  . Occupation: n/a  Tobacco Use  . Smoking status: Never Smoker  . Smokeless tobacco: Never Used  Vaping Use  . Vaping Use: Never used  Substance and Sexual Activity  . Alcohol use: No  . Drug use: No  . Sexual activity: Yes    Birth control/protection: None

## 2020-05-17 ENCOUNTER — Other Ambulatory Visit: Payer: Self-pay

## 2020-05-17 ENCOUNTER — Telehealth: Payer: Self-pay | Admitting: Orthopedic Surgery

## 2020-05-17 DIAGNOSIS — M79602 Pain in left arm: Secondary | ICD-10-CM

## 2020-05-17 NOTE — Telephone Encounter (Signed)
IC s/w patient and advised  

## 2020-05-17 NOTE — Telephone Encounter (Signed)
Patient called and states to be experiencing on and off numbness and need a call back from [employer]. Patient phone number is [phone].

## 2020-05-17 NOTE — Telephone Encounter (Signed)
Thx lets start with neck

## 2020-05-17 NOTE — Telephone Encounter (Signed)
I spoke with patient. She states she is having left arm pain and numbness down entire arm. Whole arm is painful and having neck pain as well.  I looked back at last OV note and mentioned about MRI csp if not better. I did proceed with scan per note but patient wanted to know if she needed shoulder scan as well? Please advise. Thanks.

## 2020-05-17 NOTE — Progress Notes (Signed)
Spoke with patient. Per last OV note proceeded with MRI scan order of cervical spine due to bilat neck pain with left arm radicular pain.

## 2020-05-26 ENCOUNTER — Ambulatory Visit: Payer: Medicaid Other | Admitting: Orthopedic Surgery

## 2020-05-28 ENCOUNTER — Other Ambulatory Visit: Payer: Self-pay

## 2020-05-28 ENCOUNTER — Ambulatory Visit
Admission: RE | Admit: 2020-05-28 | Discharge: 2020-05-28 | Disposition: A | Discharge status: Home / Self Care | Payer: Medicaid Other | Source: Ambulatory Visit | Attending: Orthopedic Surgery | Admitting: Orthopedic Surgery

## 2020-05-28 DIAGNOSIS — M79602 Pain in left arm: Secondary | ICD-10-CM

## 2020-05-28 IMAGING — MR MR CERVICAL SPINE W/O CM
5 series · 36 of 48 positions shown · non-contrast
Comparison: Cervical spine radiographs [DATE]

CLINICAL DATA: Left radicular arm pain.  Neck pain.

EXAM:
MRI CERVICAL SPINE WITHOUT CONTRAST
TECHNIQUE: Multiplanar, multisequence MR imaging of the cervical spine was
performed. No intravenous contrast was administered.

[Series 2: T2 · sagittal · 3.0mm · 0.41mm/px · 8 of 17 slices shown (1 of 2)]
[im 1/17]
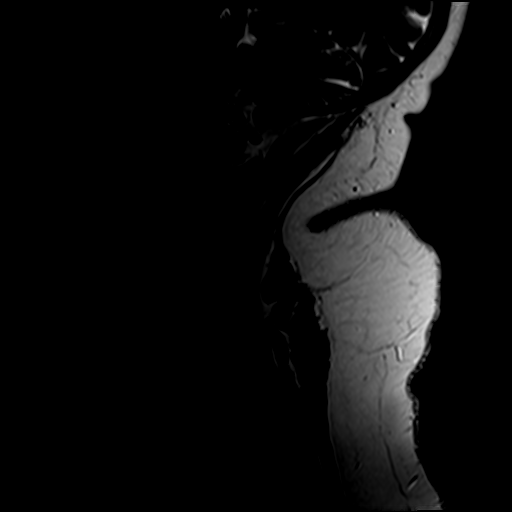
[im 3/17]
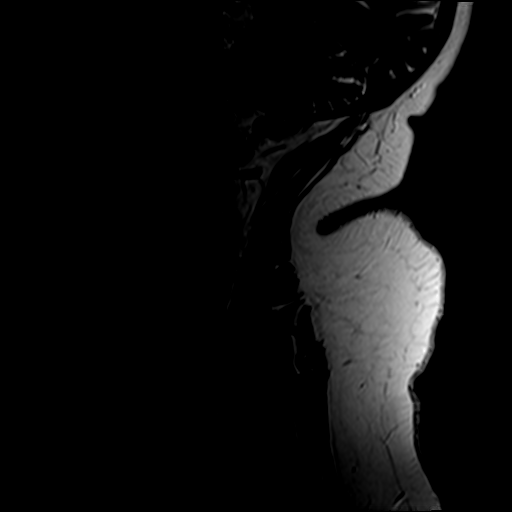
[im 5/17]
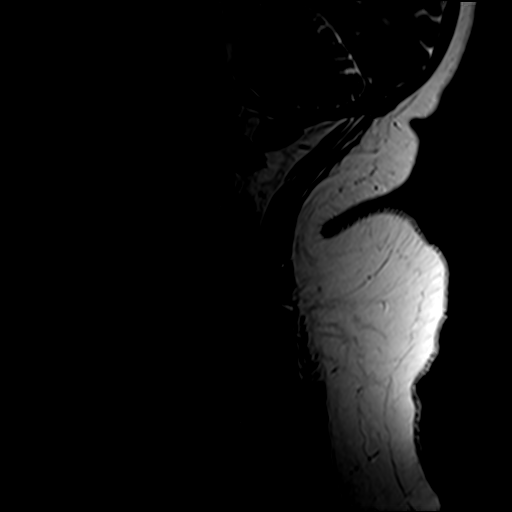
[im 7/17]
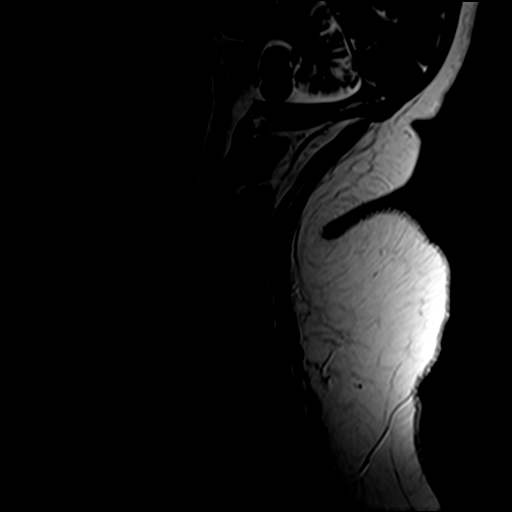
[im 10/17]
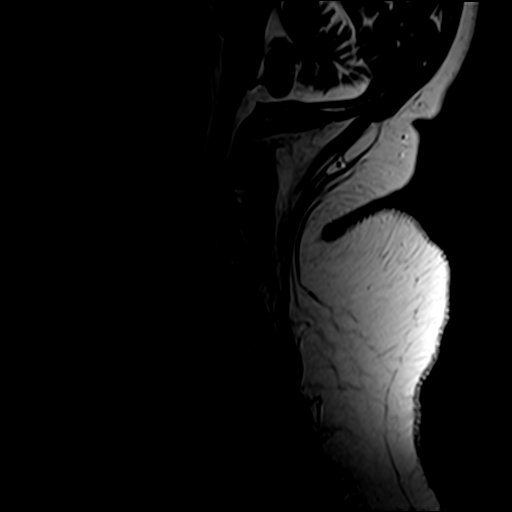
[im 12/17]
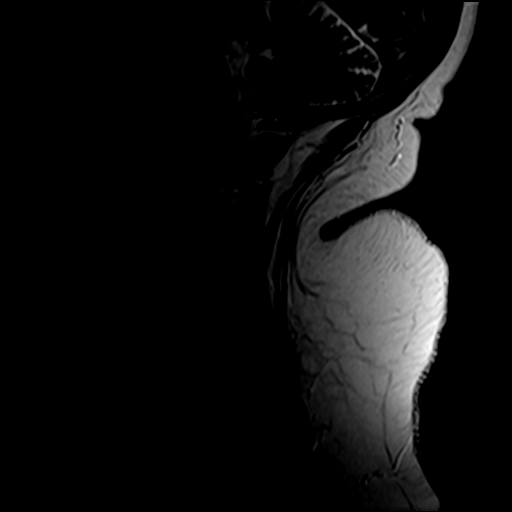
[im 14/17]
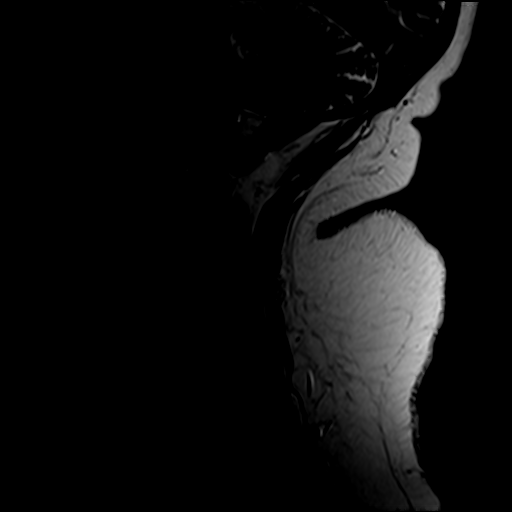
[im 17/17]
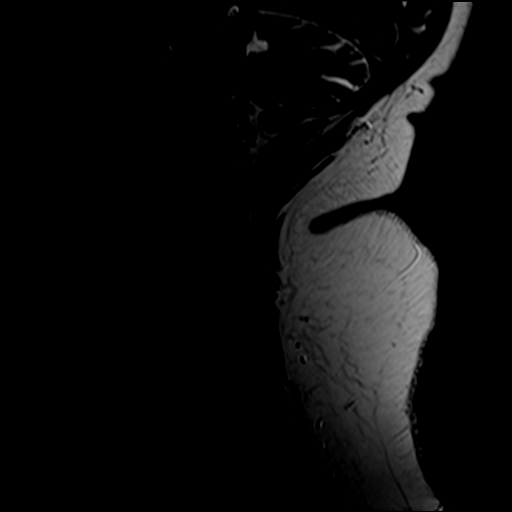

[Series 3: STIR · sagittal · 3.0mm · 0.82mm/px · 8 of 17 slices shown]
[im 1/17]
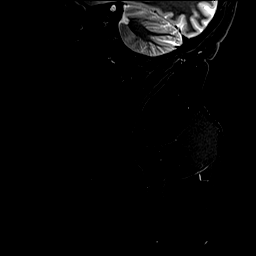
[im 3/17]
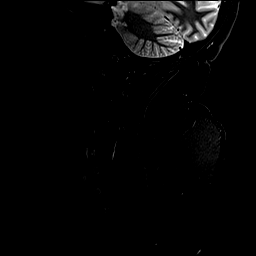
[im 5/17]
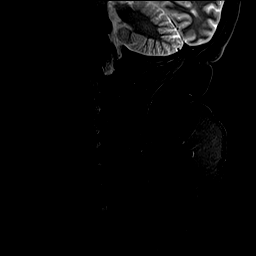
[im 7/17]
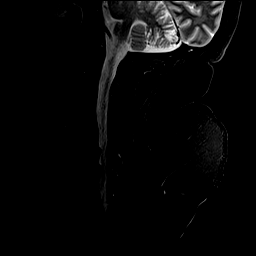
[im 10/17]
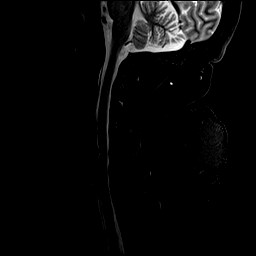
[im 12/17]
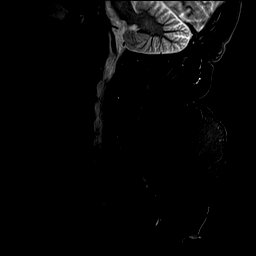
[im 14/17]
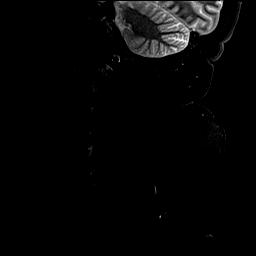
[im 17/17]
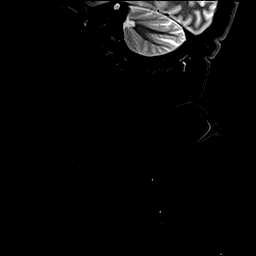

[Series 4: T1 · sagittal · 3.0mm · 0.82mm/px · 8 of 17 slices shown]
[im 1/17]
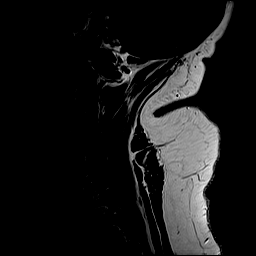
[im 3/17]
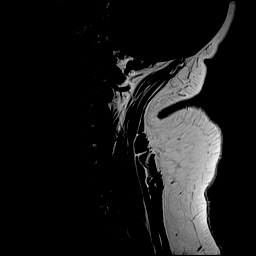
[im 5/17]
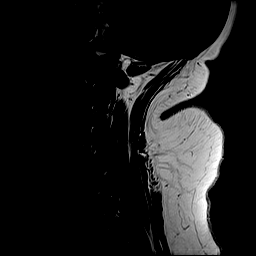
[im 7/17]
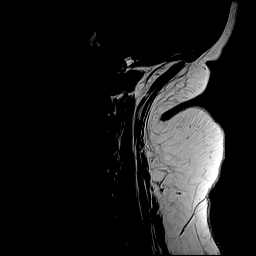
[im 10/17]
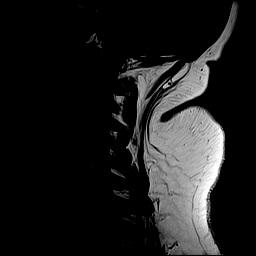
[im 12/17]
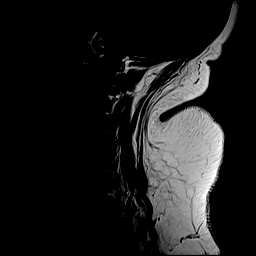
[im 14/17]
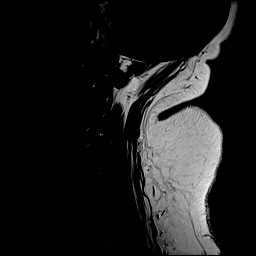
[im 17/17]
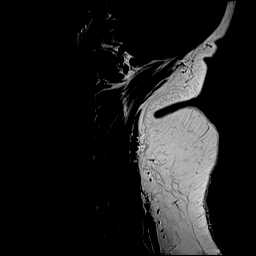

[Series 5: T2 · axial · 3.0mm · 0.70mm/px · z∈[-75,+22]mm · 9 of 27 slices shown (2 of 2)]
[im 1/27]
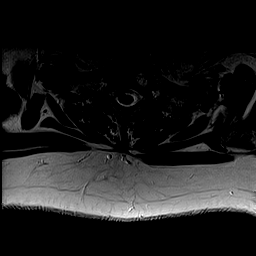
[im 5/27]
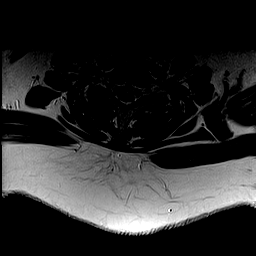
[im 8/27]
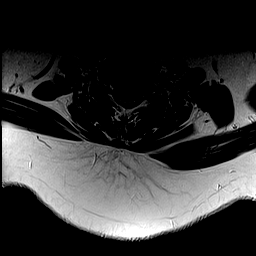
[im 12/27]
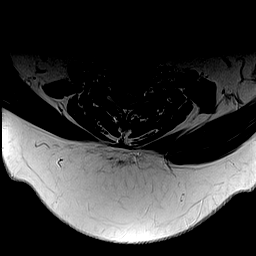
[im 15/27]
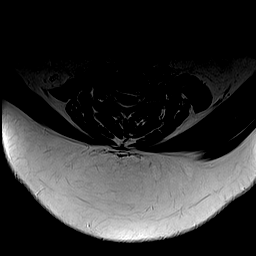
[im 19/27]
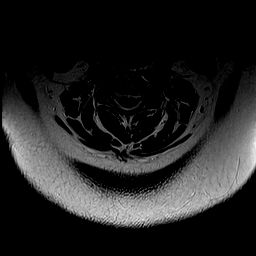
[im 22/27]
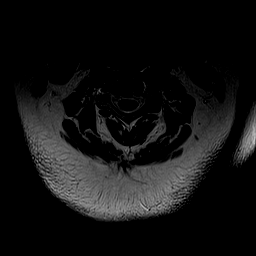
[im 24/27]
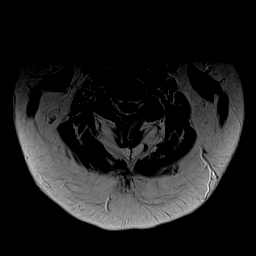
[im 27/27]
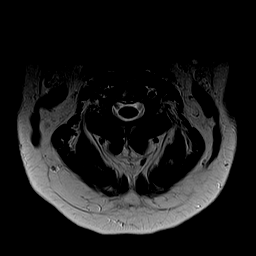

[Series 6: GRE · axial · 3.0mm · 0.35mm/px · z∈[-75,-49]mm · 3 of 27 slices shown]
[im 1/27]
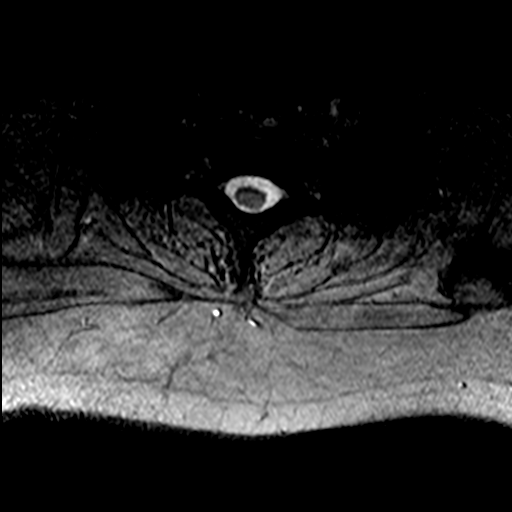
[im 5/27]
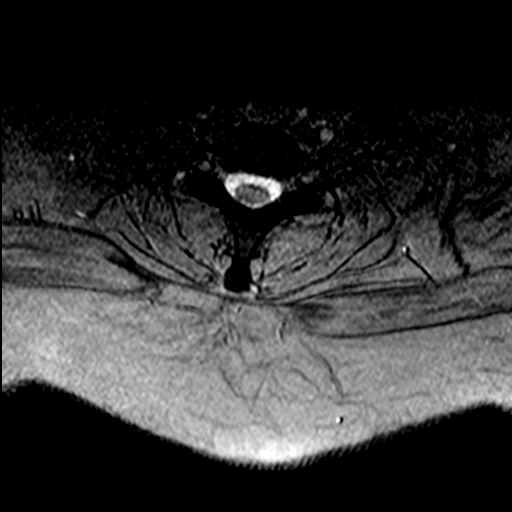
[im 8/27]
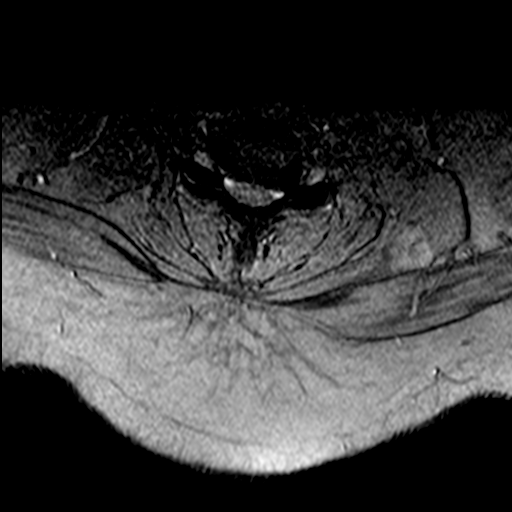

[36 of 48 positions shown; findings below may reference images not displayed]

FINDINGS: Alignment: Normal

Vertebrae: Negative for fracture or mass. Hemangioma T1 vertebral
body.

Cord: Normal signal and morphology

Posterior Fossa, vertebral arteries, paraspinal tissues: Negative

Disc levels:

C2-3: Negative

C3-4: Negative

C4-5: Disc degeneration and spurring asymmetric to the left. Mild
left foraminal narrowing. Mild cord flattening on the left with mild
spinal stenosis.

C5-6: Disc degeneration and uncinate spurring left greater than
right. Bilateral cord flattening left greater than right. Moderate
left foraminal stenosis. Right foramen patent. Mild spinal stenosis

C6-7: Disc degeneration with diffuse uncinate spurring asymmetric to
the left. Diffuse cord flattening left greater than right with
moderate spinal stenosis. Severe left foraminal stenosis due to
uncinate spurring.

C7-T1: Negative
IMPRESSION: Disc degeneration and spondylosis C4-5, C5-6, C6-7. Spurring is
asymmetric to the left with left foraminal narrowing at these levels
most severe at C6-7. Moderate spinal stenosis at C6-7.

## 2020-06-01 ENCOUNTER — Other Ambulatory Visit: Payer: Medicaid Other

## 2020-06-03 ENCOUNTER — Other Ambulatory Visit: Payer: Self-pay

## 2020-06-03 ENCOUNTER — Ambulatory Visit (INDEPENDENT_AMBULATORY_CARE_PROVIDER_SITE_OTHER): Payer: Medicaid Other | Admitting: Orthopedic Surgery

## 2020-06-03 DIAGNOSIS — M79602 Pain in left arm: Secondary | ICD-10-CM | POA: Diagnosis not present

## 2020-06-06 ENCOUNTER — Encounter: Payer: Self-pay | Admitting: Orthopedic Surgery

## 2020-06-06 NOTE — Progress Notes (Signed)
Office Visit Note   Patient: Rhonda Higgins           Date of Birth: 1971-05-29           MRN: [id number] Visit Date: 06/03/2020 Requested by: [name], MD [address] 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  MRI scan is reviewed with the patient.  Neck has been hurting for several months.  Reports some left-sided neck pain as well as arm numbness and dorsal hand numbness.  Has been going on only for about 2 weeks.  Head position eases her symptoms.  She does not take any anticoagulants.  Symptoms are worsening.              ROS: All systems reviewed are negative as they relate to the chief complaint within the history of present illness.  Patient denies  fevers or chills.   Assessment & Plan: Visit Diagnoses:  1. Left arm pain     Plan: Impression is left-sided radiculopathy.  Plan is refer to Dr. Ernestina Patches for C-spine ESI.  No discrete weakness in the arm at this time but if the injection does not help then strong consideration to surgical intervention should be made.  Follow-Up Instructions: No follow-ups on file.   Orders:  Orders Placed This Encounter  Procedures   Ambulatory referral to Physical Medicine Rehab   No orders of the defined types were placed in this encounter.     Procedures: No procedures performed   Clinical Data: No additional findings.  Objective: Vital Signs: There were no vitals taken for this visit.  Physical Exam:   Constitutional: Patient appears well-developed HEENT:  Head: Normocephalic Eyes:EOM are normal Neck: Normal range of motion Cardiovascular: Normal rate Pulmonary/chest: Effort normal Neurologic: Patient is alert Skin: Skin is warm Psychiatric: Patient has normal mood and affect    Ortho Exam: Ortho exam demonstrates full  active and passive range of motion of that left arm and shoulder.  No definite areas of muscle atrophy.  Does have some dorsal hand numbness consistent with C7 paresthesias.  Reflexes symmetric 1+ out of 4 bilateral biceps and triceps.  No masses lymphadenopathy or skin changes noted in that shoulder girdle region.  Neck range of motion is pretty reasonable with rotation 60 degrees full flexion and extension.  Specialty Comments:  No specialty comments available.  Imaging: No results found.   PMFS History: Patient Active Problem List   Diagnosis Date Noted   MDD (major depressive disorder), recurrent episode, severe ([place]) 04/01/2020   Borderline personality disorder ([place]) 04/01/2020   Intermittent explosive disorder in adult 04/01/2020   Cholelithiasis with chronic cholecystitis 09/06/2017   Multinodular goiter (nontoxic) 11/17/2015   Diabetes mellitus without complication ([place]) 35/57/3220   GERD (gastroesophageal reflux disease) 07/19/2014   HLD (hyperlipidemia) 02/09/2010   Obstructive sleep apnea 02/09/2010   Allergic rhinitis 02/09/2010   History of uterine cancer 02/09/2010   Past Medical History:  Diagnosis Date   Adenocarcinoma ([place])    endometrial, FIGO GRADE 1   Allergic rhinitis    Atypical chest pain    History of   Depression    Elevated liver enzymes    GERD (gastroesophageal reflux disease)    Hematuria    History of endometrial cancer 08-02-2009   oncologist-  dr  brewster/ [facility] and dr kinard/  no recurrence   endometrial adenocarinoma Stage 1B, Grade 1, FIGO--  s/p  TAH w/ BSO and pelvic lymph node dissection's and radiation therapy   History of kidney stones    History of radiation therapy    2011  pelvic intracavity brachytherapy treatment's for endometrial carcinoma   History of thyroid nodule    multinodular goiter s/p  total thyroidectomy 11-19-2015  per pathology -  adenomatoid nodules   Hyperlipidemia    Hypothyroidism,  postsurgical    Insulin dependent diabetes mellitus    Type 2   Left ureteral stone    Obesity    OSA (obstructive sleep apnea)    severe OSA  per study 03-08-2010--  noncomplant cpap (no more sleep apnes since lost weight 2019)   Overactive bladder    Personality disorder (HCC)    Polyphagia(783.6)    PONV (postoperative nausea and vomiting)    after ear surgery only one time   Right lower quadrant pain    Urgency of urination    UTI (urinary tract infection)     Family History  Problem Relation Age of Onset   Heart disease Sister    Heart attack Brother    Heart disease Brother    Heart disease Sister    Diabetes Sister    Breast cancer Sister    Asthma Mother    Heart disease Mother    Diabetes Mother    Emphysema Mother    Hypertension Mother    Stroke Mother    Prostate cancer Father     Past Surgical History:  Procedure Laterality Date   CARDIOVASCULAR STRESS TEST  06/09/2008   normal nuclear study w/ no ischemia/  normal LV function and wall motion , ef 83%   CHOLECYSTECTOMY N/A 09/06/2017   Procedure: LAPAROSCOPIC CHOLECYSTECTOMY WITH INTRAOPERATIVE CHOLANGIOGRAM;  Surgeon: Armandina Gemma, MD;  Location: [facility];  Service: General;  Laterality: N/A;   CYSTOSCOPY/RETROGRADE/URETEROSCOPY/STONE EXTRACTION WITH BASKET Left 03/08/2016   Procedure: CYSTOSCOPY/RETROGRADE/URETEROSCOPY/STONE EXTRACTION WITH BASKET, STENT PLACEMENT;  Surgeon: Rana Snare, MD;  Location: [facility];  Service: Urology;  Laterality: Left;   ENDOMETRIAL BIOPSY     ERCP N/A 09/07/2017   Procedure: ENDOSCOPIC RETROGRADE CHOLANGIOPANCREATOGRAPHY (ERCP);  Surgeon: [name], MD;  Location: [facility];  Service: Gastroenterology;  Laterality: N/A;   ESOPHAGOGASTRODUODENOSCOPY (EGD) WITH PROPOFOL N/A 03/16/2020   Procedure: ESOPHAGOGASTRODUODENOSCOPY (EGD) WITH PROPOFOL;  Surgeon: [name], MD;  Location: [facility];  Service: Gastroenterology;   Laterality: N/A;  unable to locate ampulla, aborted ERCP, changed to EGD   HOLMIUM LASER APPLICATION Left 10/10/5359   Procedure: HOLMIUM LASER APPLICATION;  Surgeon: Rana Snare, MD;  Location: [facility];  Service: Urology;  Laterality: Left;   MOUTH SURGERY     MYRINGECOTMY W/ REMOVAL MIDDLE EAR CHOLESTEATOMA TYPE 1 FASICA TYMPANOPLASTY  09/13/2000   ROBOTIC ASSISTED TOTAL HYSTERECTOMY WITH BILATERAL SALPINGO OOPHERECTOMY  08-02-2009   at [facility]  dr Denman George   w/  Bilateral pelvic and para aortic lymph node dissection's   THYROIDECTOMY N/A 11/19/2015   Procedure: TOTAL THYROIDECTOMY;  Surgeon: Armandina Gemma, MD;  Location: [facility];  Service: General;  Laterality: N/A;   TONSILLECTOMY  age 53   TRANSTHORACIC ECHOCARDIOGRAM  07/19/2014   ef 55-60%/  trivial TR   TYMPANOPLASTY Right 1993   Social History   Occupational History   Occupation: n/a  Tobacco Use   Smoking status: Never Smoker   Smokeless tobacco: Never Used  Electronics engineer  Use   Vaping Use: Never used  Substance and Sexual Activity   Alcohol use: No   Drug use: No   Sexual activity: Yes    Birth control/protection: None

## 2020-06-16 ENCOUNTER — Telehealth: Payer: Self-pay | Admitting: Orthopedic Surgery

## 2020-06-16 ENCOUNTER — Other Ambulatory Visit: Payer: Self-pay | Admitting: Surgical

## 2020-06-16 MED ORDER — TRAMADOL HCL 50 MG PO TABS: 50.0000 mg | ORAL_TABLET | Freq: Two times a day (BID) | ORAL | 0 refills | Status: DC | PRN

## 2020-06-16 NOTE — Telephone Encounter (Signed)
Please advise 

## 2020-06-16 NOTE — Telephone Encounter (Signed)
Sent in Tramadol and called pt

## 2020-06-16 NOTE — Telephone Encounter (Signed)
Patient called requesting pain medication for neck pains. Please send to pharmacy on file. Please call pt when medication has been sent in. Patient phone number is [phone].

## 2020-06-16 NOTE — Telephone Encounter (Signed)
noted 

## 2020-07-08 ENCOUNTER — Ambulatory Visit: Payer: Medicaid Other | Admitting: Physical Medicine and Rehabilitation

## 2020-07-29 ENCOUNTER — Ambulatory Visit: Payer: Self-pay

## 2020-07-29 ENCOUNTER — Encounter: Payer: Self-pay | Admitting: Physical Medicine and Rehabilitation

## 2020-07-29 ENCOUNTER — Ambulatory Visit (INDEPENDENT_AMBULATORY_CARE_PROVIDER_SITE_OTHER): Payer: Medicaid Other | Admitting: Physical Medicine and Rehabilitation

## 2020-07-29 ENCOUNTER — Other Ambulatory Visit: Payer: Self-pay

## 2020-07-29 VITALS — BP 123/80 | HR 79

## 2020-07-29 DIAGNOSIS — K7460 Unspecified cirrhosis of liver: Secondary | ICD-10-CM | POA: Insufficient documentation

## 2020-07-29 DIAGNOSIS — K760 Fatty (change of) liver, not elsewhere classified: Secondary | ICD-10-CM | POA: Insufficient documentation

## 2020-07-29 DIAGNOSIS — M4802 Spinal stenosis, cervical region: Secondary | ICD-10-CM

## 2020-07-29 DIAGNOSIS — M5412 Radiculopathy, cervical region: Secondary | ICD-10-CM | POA: Diagnosis not present

## 2020-07-29 DIAGNOSIS — K3184 Gastroparesis: Secondary | ICD-10-CM | POA: Insufficient documentation

## 2020-07-29 DIAGNOSIS — K589 Irritable bowel syndrome without diarrhea: Secondary | ICD-10-CM | POA: Insufficient documentation

## 2020-07-29 MED ORDER — BETAMETHASONE SOD PHOS & ACET 6 (3-3) MG/ML IJ SUSP
12.0000 mg | Freq: Once | INTRAMUSCULAR | Status: AC
  Administered 2020-07-29: 12 mg

## 2020-07-29 NOTE — Patient Instructions (Signed)

## 2020-07-29 NOTE — Progress Notes (Signed)
C7 dermatome.Pt state neck pain that travels down her left arm and hands. Pt state she feels numbness at times when the pain travels down her to pointer and middle finger. Pt state she takes pain meds to help ease the pain.  Numeric Pain Rating Scale and Functional Assessment Average Pain 4   In the last MONTH (on 0-10 scale) has pain interfered with the following?  1. General activity like being  able to carry out your everyday physical activities such as walking, climbing stairs, carrying groceries, or moving a chair?  Rating(8)   +Driver, -BT, -Dye Allergies.

## 2020-07-29 NOTE — Progress Notes (Signed)
Rhonda Higgins Higgins - 50 y.o. female MRN [id number]  Date of birth: 1970-09-29  Office Visit Note: Visit Date: 07/29/2020 PCP: [name], MD Referred by: [name], MD  Subjective: Chief Complaint  Patient presents with  . Neck - Pain, Numbness  . Left Shoulder - Pain, Numbness  . Left Arm - Pain  . Left Hand - Pain   HPI:  Rhonda Higgins is a 50 y.o. female who comes in today at the request of Dr. Anderson Malta for planned Left C7-T1 Cervical epidural steroid injection with fluoroscopic guidance.  The patient has failed conservative care including home exercise, medications, time and activity modification.  This injection will be diagnostic and hopefully therapeutic.  Please see requesting physician notes for further details and justification.  MRI reviewed with images and spine model.  MRI reviewed in the note below.  Pain in classic C7 dermatome on the right.   ROS Otherwise per HPI.  Assessment & Plan: Visit Diagnoses:    ICD-10-CM   1. Cervical radiculopathy  M54.12 XR C-ARM NO REPORT    Epidural Steroid injection    betamethasone acetate-betamethasone sodium phosphate (CELESTONE) injection 12 mg  2. Spinal stenosis of cervical region  M48.02 XR C-ARM NO REPORT    Epidural Steroid injection    betamethasone acetate-betamethasone sodium phosphate (CELESTONE) injection 12 mg    Plan: No additional findings.   Meds & Orders:  Meds ordered this encounter  Medications  . betamethasone acetate-betamethasone sodium phosphate (CELESTONE) injection 12 mg    Orders Placed This Encounter  Procedures  . XR C-ARM NO REPORT  . Epidural Steroid injection    Follow-up: Return if symptoms worsen or fail to improve.   Procedures: No procedures performed  Cervical Epidural Steroid Injection - Interlaminar Approach with Fluoroscopic Guidance  Patient: Rhonda Higgins Higgins      Date of Birth: 1971/02/12 MRN: [id number] PCP: [name],  MD      Visit Date: 07/29/2020   Universal Protocol:    Date/Time: 07/29/2208:50 AM  Consent Given By: the patient  Position: PRONE  Additional Comments: Vital signs were monitored before and after the procedure. Patient was prepped and draped in the usual sterile fashion. The correct patient, procedure, and site was verified.   Injection Procedure Details:   Procedure diagnoses: Cervical radiculopathy [M54.12]    Meds Administered:  Meds ordered this encounter  Medications  . betamethasone acetate-betamethasone sodium phosphate (CELESTONE) injection 12 mg     Laterality: Left  Location/Site: C7-T1  Needle: 3.5 in., 20 ga. Tuohy  Needle Placement: Paramedian epidural space  Findings:  -Comments: Excellent flow of contrast into the epidural space.  Procedure Details: Using a paramedian approach from the side mentioned above, the region overlying the inferior lamina was localized under fluoroscopic visualization and the soft tissues overlying this structure were infiltrated with 4 ml. of 1% Lidocaine without Epinephrine. A # 20 gauge, Tuohy needle was inserted into the epidural space using a paramedian approach.  The epidural space was localized using loss of resistance along with contralateral oblique bi-planar fluoroscopic views.  After negative aspirate for air, blood, and CSF, a 2 ml. volume of Isovue-250 was injected into the epidural space and the flow of contrast was observed. Radiographs were obtained for documentation purposes.   The injectate was administered into the level noted above.  Additional Comments:  The patient tolerated the procedure well Dressing: 2 x 2 sterile gauze and Band-Aid    Post-procedure details:  Patient was observed during the procedure. Post-procedure instructions were reviewed.  Patient left the clinic in stable condition.     Clinical History: MRI CERVICAL SPINE WITHOUT CONTRAST  TECHNIQUE: Multiplanar, multisequence MR  imaging of the cervical spine was performed. No intravenous contrast was administered.  COMPARISON:  Cervical spine radiographs 04/28/2020  FINDINGS: Alignment: Normal  Vertebrae: Negative for fracture or mass. Hemangioma T1 vertebral body.  Cord: Normal signal and morphology  Posterior Fossa, vertebral arteries, paraspinal tissues: Negative  Disc levels:  C2-3: Negative  C3-4: Negative  C4-5: Disc degeneration and spurring asymmetric to the left. Mild left foraminal narrowing. Mild cord flattening on the left with mild spinal stenosis.  C5-6: Disc degeneration and uncinate spurring left greater than right. Bilateral cord flattening left greater than right. Moderate left foraminal stenosis. Right foramen patent. Mild spinal stenosis  C6-7: Disc degeneration with diffuse uncinate spurring asymmetric to the left. Diffuse cord flattening left greater than right with moderate spinal stenosis. Severe left foraminal stenosis due to uncinate spurring.  C7-T1: Negative  IMPRESSION: Disc degeneration and spondylosis C4-5, C5-6, C6-7. Spurring is asymmetric to the left with left foraminal narrowing at these levels most severe at C6-7. Moderate spinal stenosis at C6-7.   Electronically Signed   By: Franchot Gallo M.D.   On: 05/28/2020 08:19     Objective:  VS:  HT:    WT:   BMI:     BP:123/80  HR:79bpm  TEMP: ( )  RESP:  Physical Exam   Imaging: XR C-ARM NO REPORT  Result Date: 07/29/2020 Please see Notes tab for imaging impression.

## 2020-07-29 NOTE — Procedures (Signed)
Cervical Epidural Steroid Injection - Interlaminar Approach with Fluoroscopic Guidance  Patient: Rhonda Higgins      Date of Birth: 1971-02-17 MRN: [id number] PCP: [name], MD      Visit Date: 07/29/2020   Universal Protocol:    Date/Time: 07/29/2208:50 AM  Consent Given By: the patient  Position: PRONE  Additional Comments: Vital signs were monitored before and after the procedure. Patient was prepped and draped in the usual sterile fashion. The correct patient, procedure, and site was verified.   Injection Procedure Details:   Procedure diagnoses: Cervical radiculopathy [M54.12]    Meds Administered:  Meds ordered this encounter  Medications  . betamethasone acetate-betamethasone sodium phosphate (CELESTONE) injection 12 mg     Laterality: Left  Location/Site: C7-T1  Needle: 3.5 in., 20 ga. Tuohy  Needle Placement: Paramedian epidural space  Findings:  -Comments: Excellent flow of contrast into the epidural space.  Procedure Details: Using a paramedian approach from the side mentioned above, the region overlying the inferior lamina was localized under fluoroscopic visualization and the soft tissues overlying this structure were infiltrated with 4 ml. of 1% Lidocaine without Epinephrine. A # 20 gauge, Tuohy needle was inserted into the epidural space using a paramedian approach.  The epidural space was localized using loss of resistance along with contralateral oblique bi-planar fluoroscopic views.  After negative aspirate for air, blood, and CSF, a 2 ml. volume of Isovue-250 was injected into the epidural space and the flow of contrast was observed. Radiographs were obtained for documentation purposes.   The injectate was administered into the level noted above.  Additional Comments:  The patient tolerated the procedure well Dressing: 2 x 2 sterile gauze and Band-Aid    Post-procedure details: Patient was observed during the  procedure. Post-procedure instructions were reviewed.  Patient left the clinic in stable condition.

## 2020-08-03 ENCOUNTER — Ambulatory Visit (INDEPENDENT_AMBULATORY_CARE_PROVIDER_SITE_OTHER): Payer: Medicaid Other | Admitting: Podiatry

## 2020-08-03 ENCOUNTER — Other Ambulatory Visit: Payer: Self-pay

## 2020-08-03 ENCOUNTER — Encounter: Payer: Self-pay | Admitting: Podiatry

## 2020-08-03 DIAGNOSIS — R7402 Elevation of levels of lactic acid dehydrogenase (LDH): Secondary | ICD-10-CM | POA: Insufficient documentation

## 2020-08-03 DIAGNOSIS — E1142 Type 2 diabetes mellitus with diabetic polyneuropathy: Secondary | ICD-10-CM

## 2020-08-03 DIAGNOSIS — M79675 Pain in left toe(s): Secondary | ICD-10-CM | POA: Diagnosis not present

## 2020-08-03 DIAGNOSIS — M79674 Pain in right toe(s): Secondary | ICD-10-CM

## 2020-08-03 DIAGNOSIS — R1013 Epigastric pain: Secondary | ICD-10-CM | POA: Insufficient documentation

## 2020-08-03 DIAGNOSIS — K831 Obstruction of bile duct: Secondary | ICD-10-CM | POA: Insufficient documentation

## 2020-08-03 DIAGNOSIS — K7689 Other specified diseases of liver: Secondary | ICD-10-CM | POA: Insufficient documentation

## 2020-08-03 DIAGNOSIS — R7401 Elevation of levels of liver transaminase levels: Secondary | ICD-10-CM | POA: Insufficient documentation

## 2020-08-03 DIAGNOSIS — B351 Tinea unguium: Secondary | ICD-10-CM

## 2020-08-03 DIAGNOSIS — M792 Neuralgia and neuritis, unspecified: Secondary | ICD-10-CM

## 2020-08-08 NOTE — Progress Notes (Signed)
Subjective: Rhonda Higgins presents today for at risk foot care with history of diabetic neuropathy and painful thick toenails that are difficult to trim. Pain interferes with ambulation. Aggravating factors include wearing enclosed shoe gear. Pain is relieved with periodic professional debridement.  She states she had a steroid injection in her neck and blood sugars have been elevated. She states blood glucose was 311 mg/dl two nights ago.  [name], MD is patient's PCP.  Allergies  Allergen Reactions  . Hydrocodone Shortness Of Breath and Itching  . Tape Rash    Paper tape is ok  . Codeine Itching   Social History   Occupational History  . Occupation: n/a  Tobacco Use  . Smoking status: Never Smoker  . Smokeless tobacco: Never Used  Vaping Use  . Vaping Use: Never used  Substance and Sexual Activity  . Alcohol use: No  . Drug use: No  . Sexual activity: Yes    Birth control/protection: None   Family History  Problem Relation Age of Onset  . Heart disease Sister   . Heart attack Brother   . Heart disease Brother   . Heart disease Sister   . Diabetes Sister   . Breast cancer Sister   . Asthma Mother   . Heart disease Mother   . Diabetes Mother   . Emphysema Mother   . Hypertension Mother   . Stroke Mother   . Prostate cancer Father     Review of Systems: Negative except as noted in the HPI.  Objective: There were no vitals filed for this visit.  Rhonda Higgins is a pleasant 50 y.o. female in NAD. AAO X 3.  Vascular Examination: Capillary refill time to digits immediate b/l. Palpable pedal pulses b/l LE. Pedal hair absent. Lower extremity skin temperature gradient within normal limits.  Dermatological Examination: Pedal skin with normal turgor, texture and tone bilaterally. No open wounds bilaterally. No interdigital macerations bilaterally. Toenails 1-5 b/l elongated, discolored, dystrophic, thickened, crumbly with subungual debris  and tenderness to dorsal palpation.  Musculoskeletal Examination: Normal muscle strength 5/5 to all lower extremity muscle groups bilaterally. No pain crepitus or joint limitation noted with ROM b/l. No gross bony deformities bilaterally.  Neurological Examination: Pt has subjective symptoms of neuropathy. Protective sensation diminished with 10g monofilament b/l.  Hemoglobin A1C Latest Ref Rng & Units 04/03/2020  HGBA1C 4.8 - 5.6 % 7.4(H)  Some recent data might be hidden   Assessment: 1. Pain due to onychomycosis of toenails of both feet   2. Neuropathic pain   3. Diabetic peripheral neuropathy associated with type 2 diabetes mellitus ([place])     Plan: -Examined patient. -No new findings. No new orders. -Continue diabetic foot care principles. -Patient to continue soft, supportive shoe gear daily. -Toenails 1-5 b/l were debrided in length and girth with sterile nail nippers and dremel without iatrogenic bleeding.  -Patient to report any pedal injuries to medical professional immediately. -Patient/POA to call should there be question/concern in the interim.  Return in about 3 months (around 10/31/2020).  Marzetta Board, DPM

## 2020-08-31 ENCOUNTER — Other Ambulatory Visit: Payer: Self-pay | Admitting: Gastroenterology

## 2020-08-31 DIAGNOSIS — K7469 Other cirrhosis of liver: Secondary | ICD-10-CM

## 2020-08-31 DIAGNOSIS — K7689 Other specified diseases of liver: Secondary | ICD-10-CM

## 2020-09-25 ENCOUNTER — Emergency Department (HOSPITAL_COMMUNITY)
Admission: EM | Admit: 2020-09-25 | Discharge: 2020-09-25 | Disposition: A | Discharge status: Home / Self Care | Payer: Medicaid Other | Attending: Emergency Medicine | Admitting: Emergency Medicine

## 2020-09-25 ENCOUNTER — Emergency Department (HOSPITAL_COMMUNITY): Payer: Medicaid Other

## 2020-09-25 ENCOUNTER — Encounter (HOSPITAL_COMMUNITY): Payer: Self-pay | Admitting: Emergency Medicine

## 2020-09-25 ENCOUNTER — Other Ambulatory Visit: Payer: Self-pay

## 2020-09-25 DIAGNOSIS — Z7984 Long term (current) use of oral hypoglycemic drugs: Secondary | ICD-10-CM | POA: Diagnosis not present

## 2020-09-25 DIAGNOSIS — Z8542 Personal history of malignant neoplasm of other parts of uterus: Secondary | ICD-10-CM | POA: Insufficient documentation

## 2020-09-25 DIAGNOSIS — R109 Unspecified abdominal pain: Secondary | ICD-10-CM | POA: Diagnosis present

## 2020-09-25 DIAGNOSIS — K219 Gastro-esophageal reflux disease without esophagitis: Secondary | ICD-10-CM | POA: Diagnosis not present

## 2020-09-25 DIAGNOSIS — Z794 Long term (current) use of insulin: Secondary | ICD-10-CM | POA: Insufficient documentation

## 2020-09-25 DIAGNOSIS — R197 Diarrhea, unspecified: Secondary | ICD-10-CM | POA: Insufficient documentation

## 2020-09-25 DIAGNOSIS — E039 Hypothyroidism, unspecified: Secondary | ICD-10-CM | POA: Insufficient documentation

## 2020-09-25 DIAGNOSIS — E119 Type 2 diabetes mellitus without complications: Secondary | ICD-10-CM | POA: Insufficient documentation

## 2020-09-25 DIAGNOSIS — Z7982 Long term (current) use of aspirin: Secondary | ICD-10-CM | POA: Diagnosis not present

## 2020-09-25 DIAGNOSIS — Z79899 Other long term (current) drug therapy: Secondary | ICD-10-CM | POA: Insufficient documentation

## 2020-09-25 LAB — CBC WITH DIFFERENTIAL/PLATELET
Abs Immature Granulocytes: 0.02 10*3/uL (ref 0.00–0.07)
Basophils Absolute: 0 10*3/uL (ref 0.0–0.1)
Basophils Relative: 1 %
Eosinophils Absolute: 0 10*3/uL (ref 0.0–0.5)
Eosinophils Relative: 1 %
HCT: 39.7 % (ref 36.0–46.0)
Hemoglobin: 12.4 g/dL (ref 12.0–15.0)
Immature Granulocytes: 1 %
Lymphocytes Relative: 29 %
Lymphs Abs: 1 10*3/uL (ref 0.7–4.0)
MCH: 25.7 pg — ABNORMAL LOW (ref 26.0–34.0)
MCHC: 31.2 g/dL (ref 30.0–36.0)
MCV: 82.2 fL (ref 80.0–100.0)
Monocytes Absolute: 0.3 10*3/uL (ref 0.1–1.0)
Monocytes Relative: 8 %
Neutro Abs: 2.1 10*3/uL (ref 1.7–7.7)
Neutrophils Relative %: 60 %
Platelets: 106 10*3/uL — ABNORMAL LOW (ref 150–400)
RBC: 4.83 MIL/uL (ref 3.87–5.11)
RDW: 16.6 % — ABNORMAL HIGH (ref 11.5–15.5)
WBC: 3.4 10*3/uL — ABNORMAL LOW (ref 4.0–10.5)
nRBC: 0 % (ref 0.0–0.2)

## 2020-09-25 LAB — URINALYSIS, ROUTINE W REFLEX MICROSCOPIC
Bilirubin Urine: NEGATIVE
Glucose, UA: 50 mg/dL — AB
Hgb urine dipstick: NEGATIVE
Ketones, ur: 5 mg/dL — AB
Leukocytes,Ua: NEGATIVE
Nitrite: NEGATIVE
Protein, ur: NEGATIVE mg/dL
Specific Gravity, Urine: 1.02 (ref 1.005–1.030)
pH: 6 (ref 5.0–8.0)

## 2020-09-25 LAB — COMPREHENSIVE METABOLIC PANEL
ALT: 50 U/L — ABNORMAL HIGH (ref 0–44)
AST: 67 U/L — ABNORMAL HIGH (ref 15–41)
Albumin: 3.7 g/dL (ref 3.5–5.0)
Alkaline Phosphatase: 91 U/L (ref 38–126)
Anion gap: 10 (ref 5–15)
BUN: 10 mg/dL (ref 6–20)
CO2: 25 mmol/L (ref 22–32)
Calcium: 8.4 mg/dL — ABNORMAL LOW (ref 8.9–10.3)
Chloride: 101 mmol/L (ref 98–111)
Creatinine, Ser: 0.69 mg/dL (ref 0.44–1.00)
GFR, Estimated: >60 mL/min (ref >60)
Glucose, Bld: 174 mg/dL — ABNORMAL HIGH (ref 70–99)
Potassium: 3.4 mmol/L — ABNORMAL LOW (ref 3.5–5.1)
Sodium: 136 mmol/L (ref 135–145)
Total Bilirubin: 1 mg/dL (ref 0.3–1.2)
Total Protein: 6.8 g/dL (ref 6.5–8.1)

## 2020-09-25 LAB — LIPASE, BLOOD: Lipase: 42 U/L (ref 11–51)

## 2020-09-25 IMAGING — CT CT ABD-PELV W/ CM
2 of 5 series · 15 of 46 positions shown, 17 images · IV contrast (omnipaque)
Comparison: Abdomen pelvis CT, dated [DATE] and MR abdomen
dated [DATE]

CLINICAL DATA: History of uterine cancer and subsequent
hysterectomy presenting with lower pelvic pain.

EXAM:
CT ABDOMEN AND PELVIS WITH CONTRAST
TECHNIQUE: Multidetector CT imaging of the abdomen and pelvis was performed
using the standard protocol following bolus administration of
intravenous contrast.
CONTRAST:  100mL OMNIPAQUE IOHEXOL 300 MG/ML  SOLN

[Series 2: axial st · axial · 0.88mm/px · z∈[-460,-75]mm · 12 of 93 slices shown, 14 images]
[im 8/93  soft-tissue]
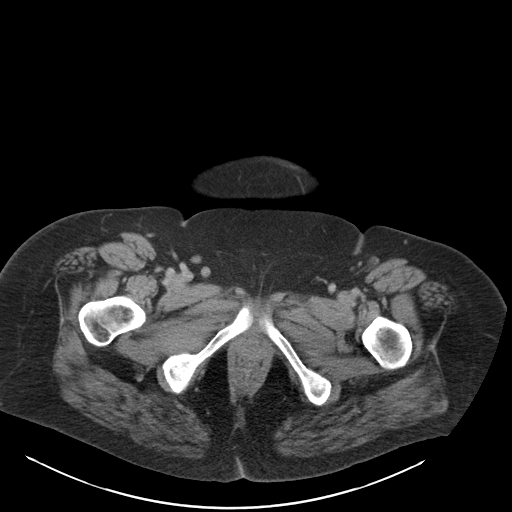
[im 8/93  bone]
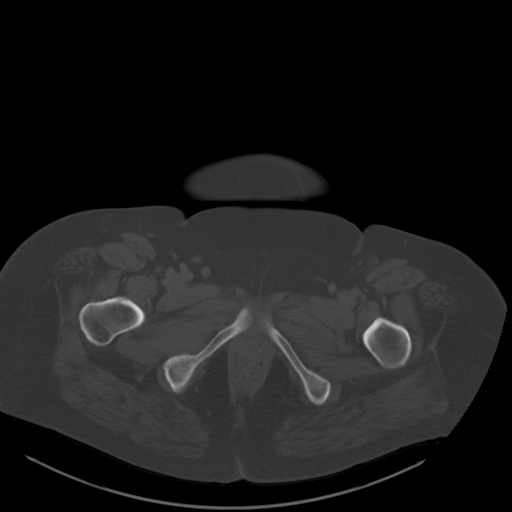
[im 15/93  soft-tissue]
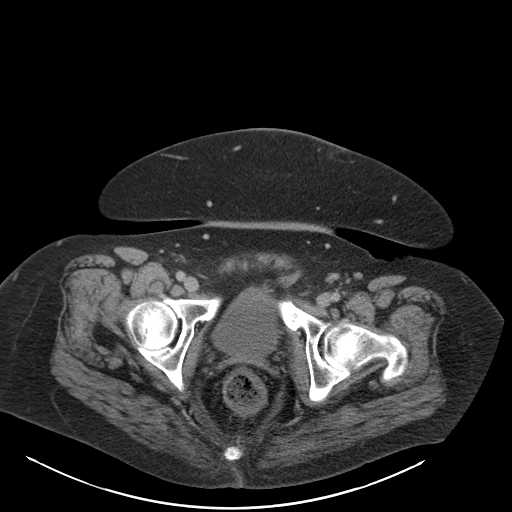
[im 22/93  soft-tissue]
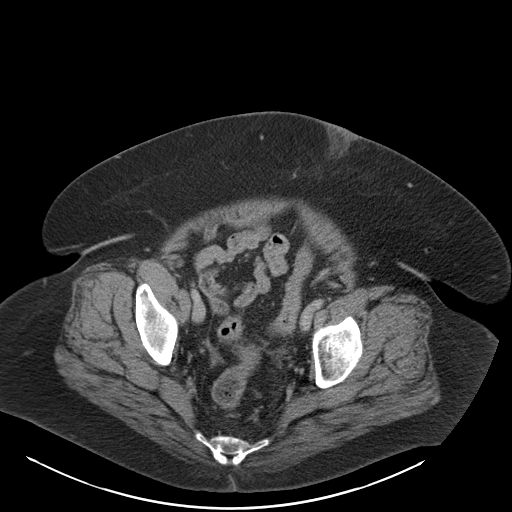
[im 29/93  soft-tissue]
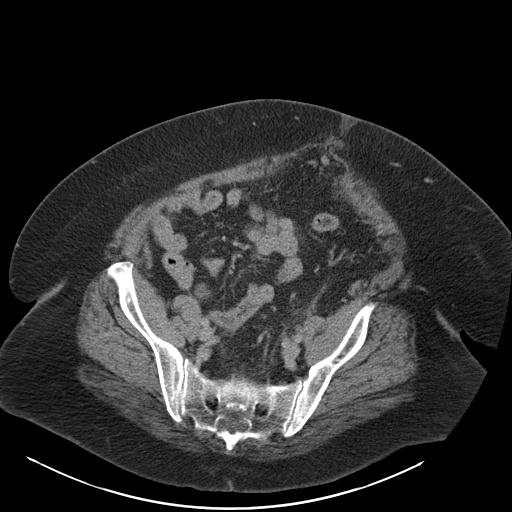
[im 36/93  soft-tissue]
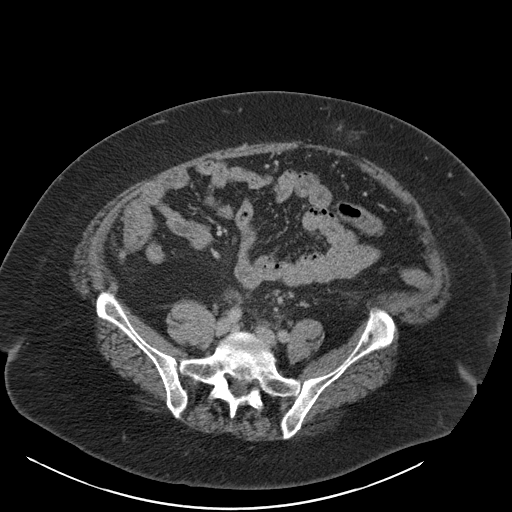
[im 43/93  soft-tissue]
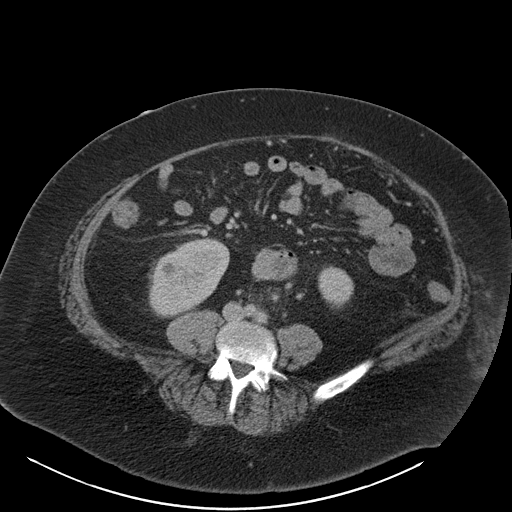
[im 50/93  soft-tissue]
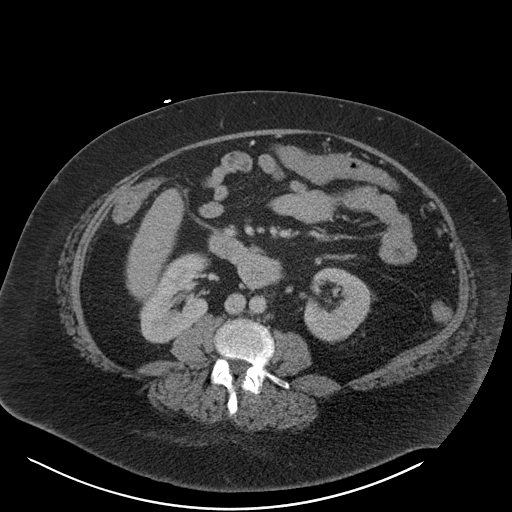
[im 57/93  soft-tissue]
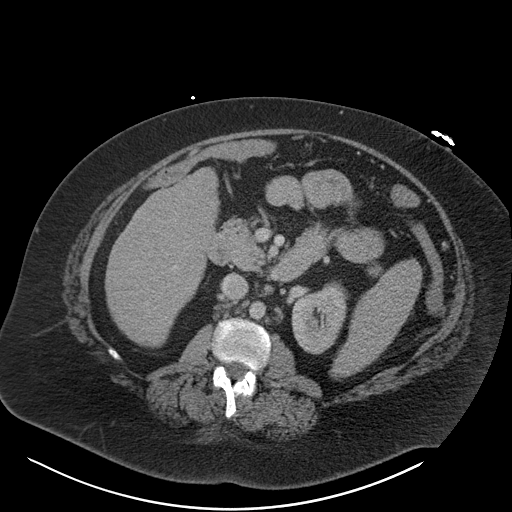
[im 64/93  soft-tissue]
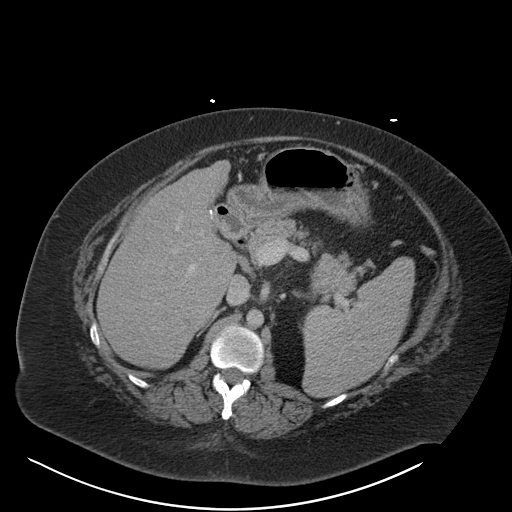
[im 64/93  bone]
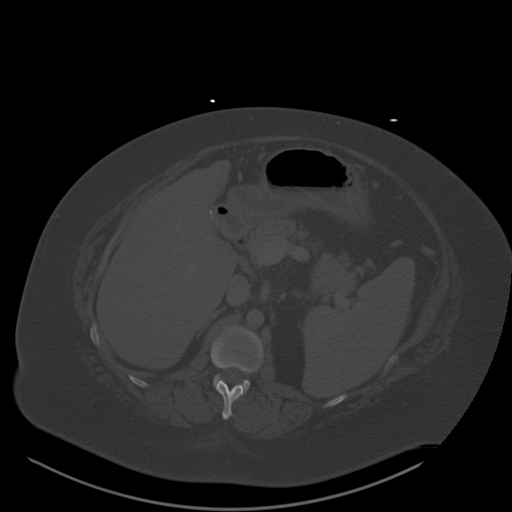
[im 71/93  soft-tissue]
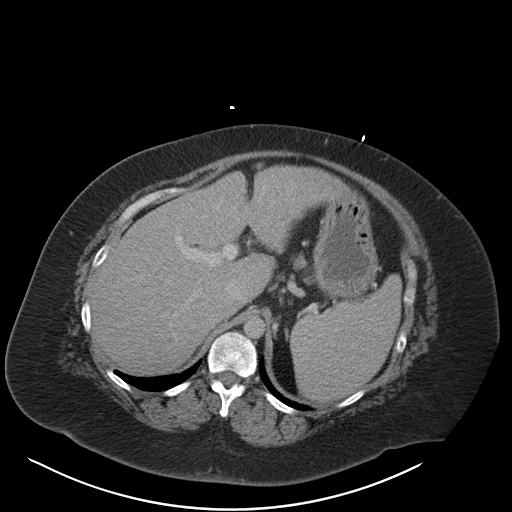
[im 78/93  soft-tissue]
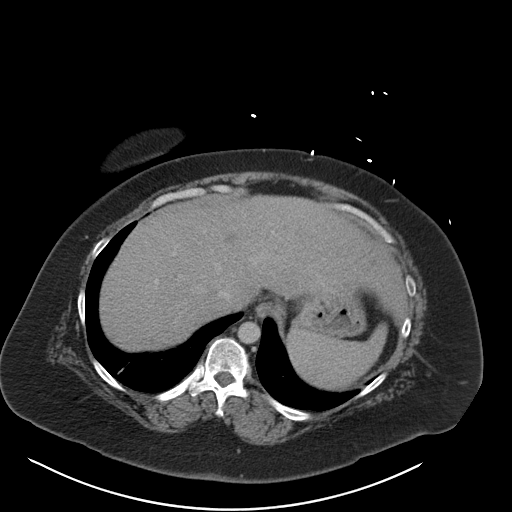
[im 85/93  soft-tissue]
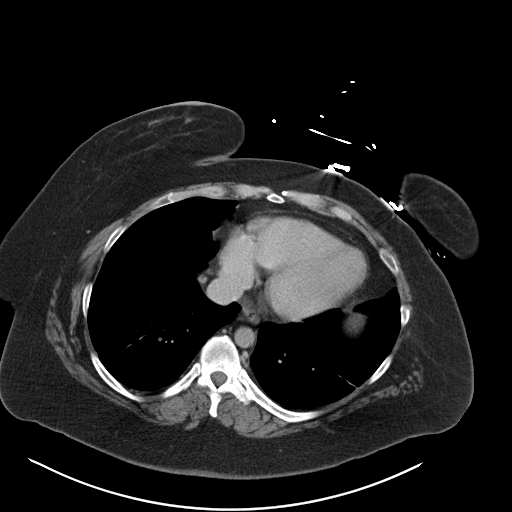

[Series 5: coronal st · coronal · 0.90mm/px · 3 of 172 slices shown]
[im 58/172  soft-tissue]
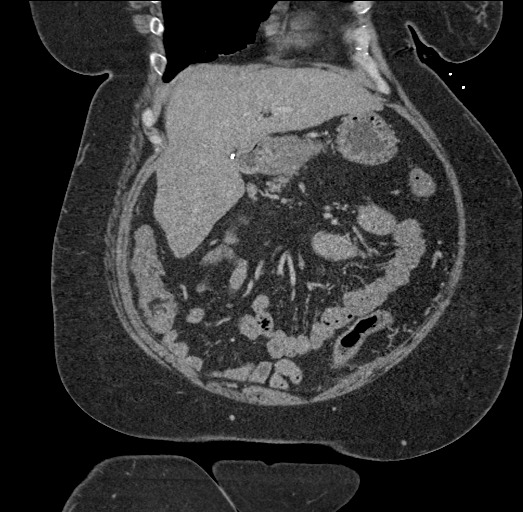
[im 77/172  soft-tissue]
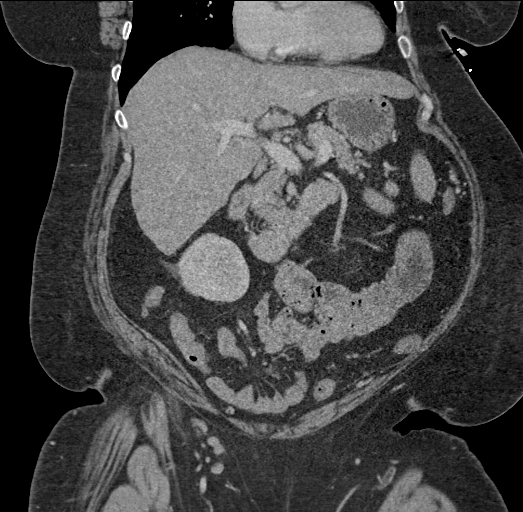
[im 96/172  soft-tissue]
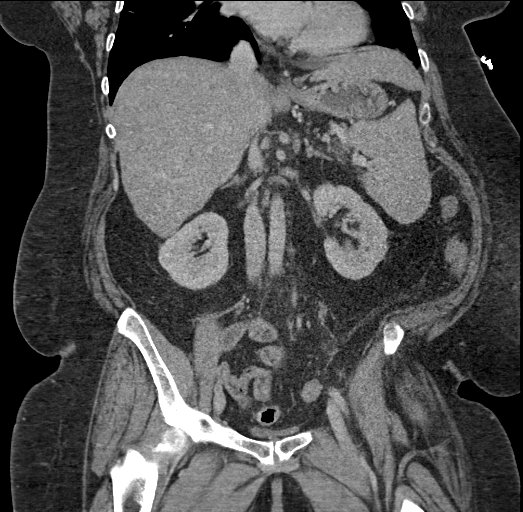

[15 of 46 positions shown; findings below may reference images not displayed]

FINDINGS: Lower chest: Mild linear atelectasis is seen within the bilateral
lung bases. A stable calcified lung nodule is also seen within the
posterior aspect of the right lung base.

Hepatobiliary: There is mild diffuse fatty infiltration of the liver
parenchyma. An ill-defined, 12 mm x 7 mm focus of parenchymal low
attenuation is seen within the anteromedial aspect of the right lobe
of the liver (axial CT image 16, CT series number 2). This
represents a new finding when compared to the prior study. A stable,
similar appearing 12 mm x 6 mm focus of low attenuation is seen
within the left lobe. Mild pneumobilia is noted. Air is also seen
within the common bile duct. Status post cholecystectomy. No biliary
dilatation.

Pancreas: Unremarkable. No pancreatic ductal dilatation or
surrounding inflammatory changes.

Spleen: The spleen is mildly enlarged and otherwise normal in
appearance

Adrenals/Urinary Tract: Adrenal glands are unremarkable. Kidneys are
normal in size, without obstructing renal calculi or hydronephrosis.
A cluster of subcentimeter nonobstructing renal stones are seen
within the posterior aspect of the mid to lower left kidney. A
cm x 0.9 cm focus of low attenuation (approximately 32.53 Hounsfield
units) is seen within the lateral aspect of the mid right kidney
(axial CT image 51, CT series number 2). Bladder is unremarkable.

Stomach/Bowel: Stomach is within normal limits. Appendix appears
normal. No evidence of bowel wall thickening, distention, or
inflammatory changes.

Vascular/Lymphatic: No significant vascular findings are present. No
enlarged abdominal or pelvic lymph nodes.

Reproductive: Status post hysterectomy. No adnexal masses.

Other: There is a predominantly stable 5.4 cm x 3.2 cm fat
containing right para umbilical hernia. No abdominopelvic ascites.

Musculoskeletal: No acute or significant osseous findings.
IMPRESSION: 1. Small low-attenuation liver lesions, as described above. While
these areas may represent a benign etiology, they are difficult to
characterize on CT examination. MRI correlation is recommended for
more complete evaluation.
2. Evidence of prior cholecystectomy with mild pneumobilia.
3. Cluster of subcentimeter nonobstructing renal calculi within the
left kidney.
4. Suspected small right renal cyst.

## 2020-09-25 MED ORDER — IOHEXOL 300 MG/ML  SOLN
100.0000 mL | Freq: Once | INTRAMUSCULAR | Status: AC | PRN
  Administered 2020-09-25: 100 mL via INTRAVENOUS

## 2020-09-25 MED ORDER — SODIUM CHLORIDE 0.9 % IV SOLN
INTRAVENOUS | Status: DC
  Administered 2020-09-25: 20 mL/h via INTRAVENOUS

## 2020-09-25 NOTE — ED Triage Notes (Signed)
Patient reports abdominal pain with history of IBS. Reports sent by GI for CT can after starting new medication yesterday with worsening pain. Denies N/V. Reports diarrhea at baseline.

## 2020-09-25 NOTE — ED Provider Notes (Signed)
[facility] DEPT Provider Note   CSN: [id number] Arrival date & time: 09/25/20  1758     History Chief Complaint  Patient presents with  . Abdominal Pain    Rhonda Higgins is a 50 y.o. female.  50 year old female with history of IBS presents increasing abdominal crampiness with some watery diarrhea.  Has had nausea but no vomiting.  No fever or chills.  Has had some urinary frequency.  States that her IBS medication stopped working recently.  Called her GI doctor and was told to come in for an abdominal CT to rule out other processes        Past Medical History:  Diagnosis Date  . Adenocarcinoma (HCC)    endometrial, FIGO GRADE 1  . Allergic rhinitis   . Atypical chest pain    History of  . Depression   . Elevated liver enzymes   . GERD (gastroesophageal reflux disease)   . Hematuria   . History of endometrial cancer 08-02-2009   oncologist-  dr brewster/ [facility] George and dr kinard/  no recurrence   endometrial adenocarinoma Stage 1B, Grade 1, FIGO--  s/p  TAH w/ BSO and pelvic lymph node dissection's and radiation therapy  . History of kidney stones   . History of radiation therapy    2011  pelvic intracavity brachytherapy treatment's for endometrial carcinoma  . History of thyroid nodule    multinodular goiter s/p  total thyroidectomy 11-19-2015  per pathology -  adenomatoid nodules  . Hyperlipidemia   . Hypothyroidism, postsurgical   . Insulin dependent diabetes mellitus    Type 2  . Left ureteral stone   . Obesity   . OSA (obstructive sleep apnea)    severe OSA  per study 03-08-2010--  noncomplant cpap (no more sleep apnes since lost weight 2019)  . Overactive bladder   . Personality disorder ([place])   . Polyphagia(783.6)   . PONV (postoperative nausea and vomiting)    after ear surgery only one time  . Right lower quadrant pain   . Urgency of urination   . UTI (urinary tract infection)     Patient Active Problem List    Diagnosis Date Noted  . Elevated levels of transaminase & lactic acid dehydrogenase 08/03/2020  . Epigastric pain 08/03/2020  . Liver nodule 08/03/2020  . Obstruction of bile duct 08/03/2020  . Cirrhosis of liver ([place]) 07/29/2020  . Gastroparesis 07/29/2020  . Irritable bowel syndrome 07/29/2020  . Steatosis of liver 07/29/2020  . MDD (major depressive disorder), recurrent episode, severe ([place]) 04/01/2020  . Borderline personality disorder ([place]) 04/01/2020  . Intermittent explosive disorder in adult 04/01/2020  . Cholelithiasis with chronic cholecystitis 09/06/2017  . Multinodular goiter (nontoxic) 11/17/2015  . Diabetes mellitus without complication ([place]) 27/78/2423  . GERD (gastroesophageal reflux disease) 07/19/2014  . HLD (hyperlipidemia) 02/09/2010  . Obstructive sleep apnea 02/09/2010  . Allergic rhinitis 02/09/2010  . History of uterine cancer 02/09/2010    Past Surgical History:  Procedure Laterality Date  . CARDIOVASCULAR STRESS TEST  06/09/2008   normal nuclear study w/ no ischemia/  normal LV function and wall motion , ef 83%  . CHOLECYSTECTOMY N/A 09/06/2017   Procedure: LAPAROSCOPIC CHOLECYSTECTOMY WITH INTRAOPERATIVE CHOLANGIOGRAM;  Surgeon: Armandina Gemma, MD;  Location: [facility];  Service: General;  Laterality: N/A;  . CYSTOSCOPY/RETROGRADE/URETEROSCOPY/STONE EXTRACTION WITH BASKET Left 03/08/2016   Procedure: CYSTOSCOPY/RETROGRADE/URETEROSCOPY/STONE EXTRACTION WITH BASKET, STENT PLACEMENT;  Surgeon: Rana Snare, MD;  Location[facility];  Service: Urology;  Laterality: Left;  . ENDOMETRIAL BIOPSY    . ERCP N/A 09/07/2017   Procedure: ENDOSCOPIC RETROGRADE CHOLANGIOPANCREATOGRAPHY (ERCP);  Surgeon: [name], MD;  Location: [facility];  Service: Gastroenterology;  Laterality: N/A;  . ESOPHAGOGASTRODUODENOSCOPY (EGD) WITH PROPOFOL N/A 03/16/2020   Procedure: ESOPHAGOGASTRODUODENOSCOPY (EGD) WITH PROPOFOL;  Surgeon: [name], MD;  Location: [facility];  Service: Gastroenterology;  Laterality: N/A;  unable to locate ampulla, aborted ERCP, changed to EGD  . HOLMIUM LASER APPLICATION Left 1/61/0960   Procedure: HOLMIUM LASER APPLICATION;  Surgeon: Rana Snare, MD;  Location: [facility];  Service: Urology;  Laterality: Left;  . MOUTH SURGERY    . MYRINGECOTMY W/ REMOVAL MIDDLE EAR CHOLESTEATOMA TYPE 1 FASICA TYMPANOPLASTY  09/13/2000  . ROBOTIC ASSISTED TOTAL HYSTERECTOMY WITH BILATERAL SALPINGO OOPHERECTOMY  08-02-2009   at [facility]  dr Denman George   w/  Bilateral pelvic and para aortic lymph node dissection's  . THYROIDECTOMY N/A 11/19/2015   Procedure: TOTAL THYROIDECTOMY;  Surgeon: Armandina Gemma, MD;  Location: [facility];  Service: General;  Laterality: N/A;  . TONSILLECTOMY  age 15  . TRANSTHORACIC ECHOCARDIOGRAM  07/19/2014   ef 55-60%/  trivial TR  . TYMPANOPLASTY Right 1993     OB History    Gravida  0   Para      Term      Preterm      AB      Living        SAB      IAB      Ectopic      Multiple      Live Births              Family History  Problem Relation Age of Onset  . Heart disease Sister   . Heart attack Brother   . Heart disease Brother   . Heart disease Sister   . Diabetes Sister   . Breast cancer Sister   . Asthma Mother   . Heart disease Mother   . Diabetes Mother   . Emphysema Mother   . Hypertension Mother   . Stroke Mother   . Prostate cancer Father     Social History   Tobacco Use  . Smoking status: Never Smoker  . Smokeless tobacco: Never Used  Vaping Use  . Vaping Use: Never used  Substance Use Topics  . Alcohol use: No  . Drug use: No    Home Medications Prior to Admission medications   Medication Sig Start Date End Date Taking? Authorizing Provider  ACCU-CHEK GUIDE test strip  03/23/20   [provider]  Accu-Chek Softclix Lancets lancets daily. 07/21/20   [provider]  aspirin 81 MG chewable tablet 1 tablet    [provider]   bisacodyl (DULCOLAX) 5 MG EC tablet 4 tablets as needed 11/13/18   [provider]  buPROPion (WELLBUTRIN XL) 150 MG 24 hr tablet Take 150 mg by mouth every morning. 07/14/20   [provider]  Cetirizine HCl (ZYRTEC ALLERGY) 10 MG CAPS 1 tablet    [provider]  clidinium-chlordiazePOXIDE (LIBRAX) 5-2.5 MG capsule 1 capsule before meals    [provider]  dicyclomine (BENTYL) 20 MG tablet 1 tablet    [provider]  gabapentin (NEURONTIN) 400 MG capsule 1 capsule    [provider]  glimepiride (AMARYL) 4 MG tablet Take 4 mg by mouth 2 (two) times daily.     [provider]  hydrOXYzine (ATARAX/VISTARIL) 25 MG  tablet Take 1 tablet (25 mg total) by mouth 3 (three) times daily as needed for anxiety. 04/04/20   Connye Burkitt, NP  Ibuprofen (MOTRIN PO)     [provider]  JANUMET 50-1000 MG tablet Take 1 tablet by mouth 2 (two) times daily. 05/24/20   [provider]  lamoTRIgine (LAMICTAL) 200 MG tablet Take 200 mg by mouth daily. 06/23/20   [provider]  lamoTRIgine (LAMICTAL) 200 MG tablet 1 tablet    [provider]  LANTUS SOLOSTAR 100 UNIT/ML Solostar Pen Inject into the skin. 04/10/20   [provider]  linagliptin (TRADJENTA) 5 MG TABS tablet Take 1 tablet (5 mg total) by mouth daily with breakfast. 04/02/20   Money, Lowry Ram, FNP  liraglutide (VICTOZA) 18 MG/3ML SOPN Inject 1.8 mg into the skin daily. 04/02/20   Money, Lowry Ram, FNP  lisinopril (PRINIVIL,ZESTRIL) 5 MG tablet Take 5 mg by mouth at bedtime.     [provider]  magnesium citrate SOLN 150 ml 07/16/18   [provider]  metFORMIN (GLUCOPHAGE) 1000 MG tablet Take 1 tablet (1,000 mg total) by mouth 2 (two) times daily with a meal. 04/01/20   Money, Lowry Ram, FNP  methocarbamol (ROBAXIN) 500 MG tablet 1 po q 8 prn 04/28/20   Magnant, Charles L, PA-C  nitrofurantoin, macrocrystal-monohydrate, (MACROBID)  100 MG capsule Take 100 mg by mouth 2 (two) times daily. 04/12/20   [provider]  ondansetron (ZOFRAN) 4 MG tablet 1 tablet 07/09/18   [provider]  pantoprazole (PROTONIX) 40 MG tablet Take 40 mg by mouth 2 (two) times daily.     [provider]  Peppermint Oil (IBGARD) 90 MG CPCR 2 capsules 12/08/19   [provider]  polyethylene glycol-electrolytes (NULYTELY) 420 g solution See admin instructions. 11/13/18   [provider]  predniSONE (STERAPRED UNI-PAK 21 TAB) 5 MG (21) TBPK tablet Take dosepak as directed 04/28/20   Magnant, Charles L, PA-C  risperiDONE (RISPERDAL) 2 MG tablet Take 2 mg by mouth at bedtime.  08/22/16   [provider]  simvastatin (ZOCOR) 40 MG tablet 1 tablet in the evening    [provider]  SitaGLIPtin-MetFORMIN HCl (JANUMET XR) 50-1000 MG TB24 1 tablets    [provider]  SYNTHROID 150 MCG tablet Take 150 mcg by mouth daily. 03/03/19   [provider]  SYNTHROID 175 MCG tablet Take 175 mcg by mouth daily. 07/27/20   [provider]  traMADol (ULTRAM) 50 MG tablet Take 1 tablet (50 mg total) by mouth every 12 (twelve) hours as needed. 06/16/20 06/16/21  Magnant, Charles L, PA-C  traZODone (DESYREL) 50 MG tablet Take 1 tablet (50 mg total) by mouth at bedtime as needed for sleep. 04/04/20   Connye Burkitt, NP  Vitamin D, Cholecalciferol, 25 MCG (1000 UT) TABS 1 tablet    [provider]    Allergies    Hydrocodone, Tape, and Codeine  Review of Systems   Review of Systems  All other systems reviewed and are negative.   Physical Exam Updated Vital Signs BP (!) 154/88   Pulse 100   Temp 98.5 F (36.9 C)   Resp 18   SpO2 96%   Physical Exam Vitals and nursing note reviewed.  Constitutional:      General: She is not in acute distress.    Appearance: Normal appearance. She is well-developed. She is not toxic-appearing.  HENT:     Head: Normocephalic and  atraumatic.  Eyes:     General: Lids are normal.     Conjunctiva/sclera: Conjunctivae normal.     Pupils: Pupils are equal, round, and reactive to light.  Neck:     Thyroid: No thyroid mass.     Trachea: No tracheal deviation.  Cardiovascular:     Rate and Rhythm: Normal rate and regular rhythm.     Heart sounds: Normal heart sounds. No murmur heard. No gallop.   Pulmonary:     Effort: Pulmonary effort is normal. No respiratory distress.     Breath sounds: Normal breath sounds. No stridor. No decreased breath sounds, wheezing, rhonchi or rales.  Abdominal:     General: Bowel sounds are normal. There is no distension.     Palpations: Abdomen is soft.     Tenderness: There is generalized abdominal tenderness. There is no guarding or rebound.  Musculoskeletal:        General: No tenderness. Normal range of motion.     Cervical back: Normal range of motion and neck supple.  Skin:    General: Skin is warm and dry.     Findings: No abrasion or rash.  Neurological:     Mental Status: She is alert and oriented to person, place, and time.     GCS: GCS eye subscore is 4. GCS verbal subscore is 5. GCS motor subscore is 6.     Cranial Nerves: No cranial nerve deficit.     Sensory: No sensory deficit.  Psychiatric:        Speech: Speech normal.        Behavior: Behavior normal.     ED Results / Procedures / Treatments   Labs (all labs ordered are listed, but only abnormal results are displayed) Labs Reviewed  URINE CULTURE  URINALYSIS, ROUTINE W REFLEX MICROSCOPIC  CBC WITH DIFFERENTIAL/PLATELET  COMPREHENSIVE METABOLIC PANEL  LIPASE, BLOOD    EKG None  Radiology No results found.  Procedures Procedures   Medications Ordered in ED Medications  0.9 %  sodium chloride infusion (has no administration in time range)    ED Course  I have reviewed the triage vital signs and the nursing notes.  Pertinent labs & imaging results that were available during my care of the  patient were reviewed by me and considered in my medical decision making (see chart for details).    MDM Rules/Calculators/A&P                         Patient's labs and imaging results reviewed here.  Patient does have lesions in her liver.  Patient was informed of this and states that she is aware of it.  No evidence of obstruction.  Labs are reassuring.  Will discharge home Final Clinical Impression(s) / ED Diagnoses Final diagnoses:  None    Rx / DC Orders ED Discharge Orders    None       Lacretia Leigh, MD 09/25/20 2152

## 2020-09-28 LAB — URINE CULTURE: Culture: <10000 — AB

## 2020-09-30 ENCOUNTER — Other Ambulatory Visit: Payer: Self-pay

## 2020-09-30 ENCOUNTER — Ambulatory Visit
Admission: RE | Admit: 2020-09-30 | Discharge: 2020-09-30 | Disposition: A | Discharge status: Home / Self Care | Payer: Medicaid Other | Source: Ambulatory Visit | Attending: Gastroenterology | Admitting: Gastroenterology

## 2020-09-30 DIAGNOSIS — K7469 Other cirrhosis of liver: Secondary | ICD-10-CM

## 2020-09-30 DIAGNOSIS — K7689 Other specified diseases of liver: Secondary | ICD-10-CM

## 2020-09-30 IMAGING — MR MR ABDOMEN WO/W CM
17 series · 48 of 48 positions shown · IV contrast (20 mL Multihance)
Comparison: [DATE] and MRI abdomen from [DATE]

CLINICAL DATA: Hepatic cirrhosis, liver nodules on recent CT of
[DATE] for further investigation.

EXAM:
MRI ABDOMEN WITHOUT AND WITH CONTRAST
TECHNIQUE: Multiplanar multisequence MR imaging of the abdomen was performed
both before and after the administration of intravenous contrast.
CONTRAST:  20mL MULTIHANCE GADOBENATE DIMEGLUMINE 529 MG/ML IV SOLN

[Series 3: T2 · coronal · 5.0mm · 1.56mm/px · 1 of 38 slices shown (1 of 3)]
[im 1/38]
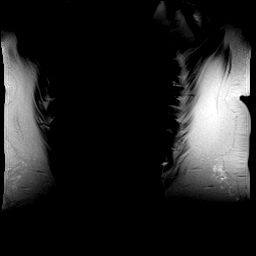

[Series 4: T1 · axial · 3.0mm · 1.25mm/px · z∈[-107,+130]mm · 5 of 160 slices shown]
[im 1/160]
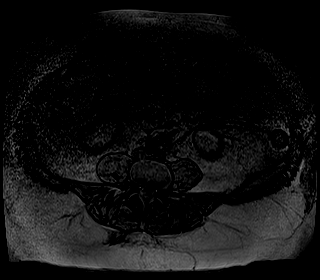
[im 40/160]
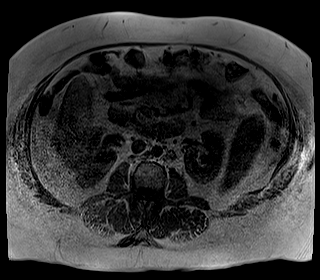
[im 80/160]
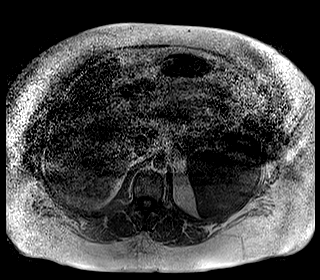
[im 120/160]
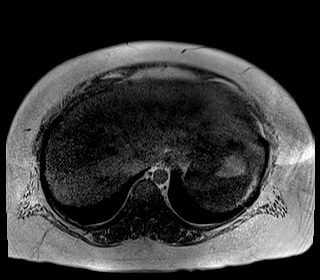
[im 160/160]
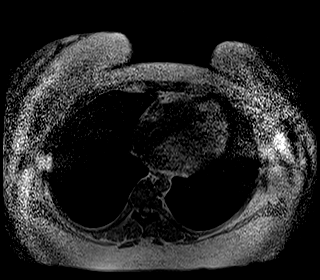

[Series 5: bSSFP · axial · 5.0mm · 1.25mm/px · z∈[-121,+143]mm · 2 of 45 slices shown]
[im 1/45]
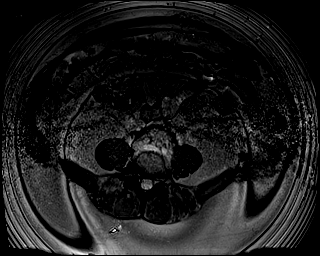
[im 45/45]
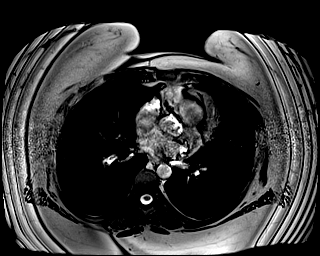

[Series 6: T2 · axial · 5.0mm · 1.56mm/px · z∈[-99,+189]mm · 2 of 49 slices shown (2 of 3)]
[im 1/49]
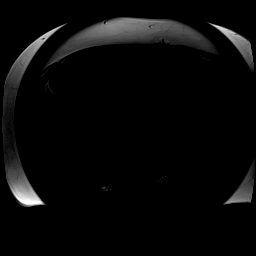
[im 49/49]
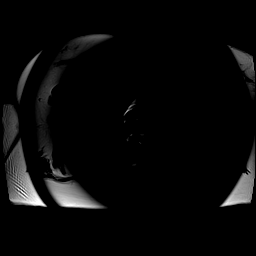

[Series 7: DWI · axial · 5.0mm · 1.49mm/px · z∈[-101,+187]mm · 5 of 147 slices shown (1 of 2)]
[im 1/147]
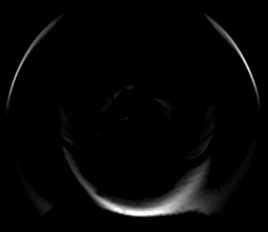
[im 37/147]
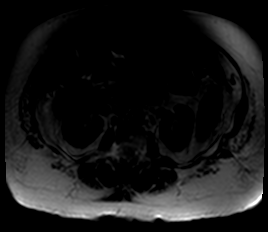
[im 74/147]
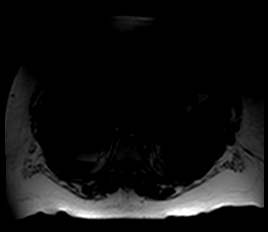
[im 110/147]
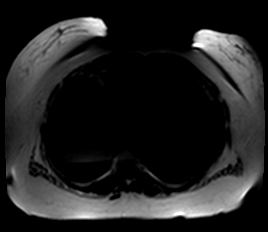
[im 147/147]
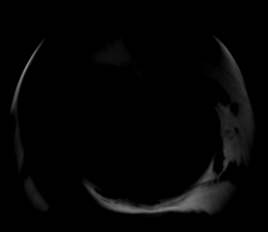

[Series 8: DWI · axial · 5.0mm · 1.49mm/px · z∈[-101,+187]mm · 2 of 49 slices shown (2 of 2)]
[im 1/49]
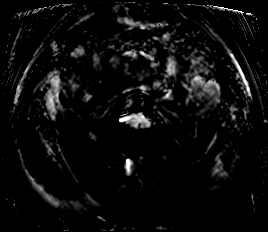
[im 49/49]
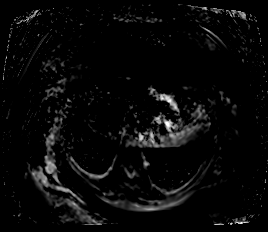

[Series 9: T2 · axial · 6.0mm · 1.25mm/px · 1 of 37 slices shown (3 of 3)]
[im 1/37]
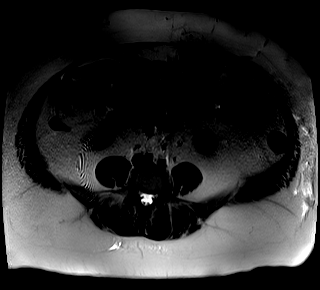

[Series 10: T1 dynamic · axial · non-contrast · 3.0mm · 1.25mm/px · z∈[-119,+142]mm · 3 of 88 slices shown]
[im 1/88]
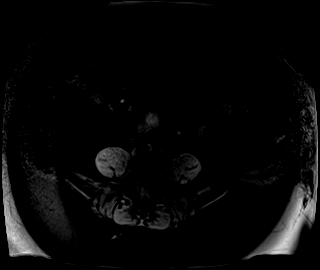
[im 44/88]
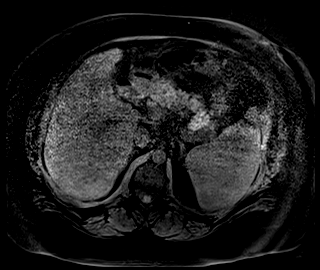
[im 88/88]
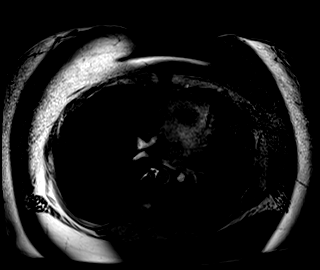

[Series 11: T1 dynamic post-contrast · axial · 3.0mm · 1.25mm/px · z∈[-119,+142]mm · 3 of 88 slices shown (1 of 9)]
[im 1/88]
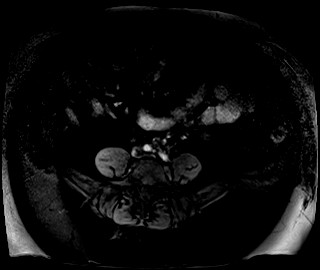
[im 44/88]
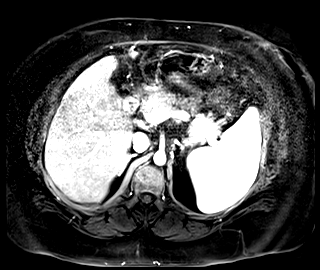
[im 88/88]
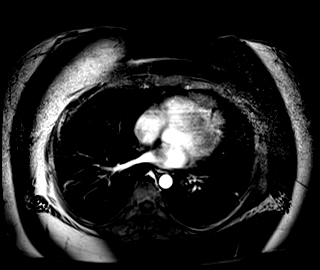

[Series 12: T1 dynamic post-contrast · axial · 3.0mm · 1.25mm/px · z∈[-119,+142]mm · 3 of 88 slices shown (2 of 9)]
[im 1/88]
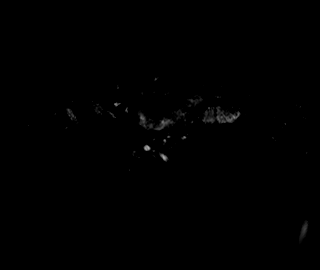
[im 44/88]
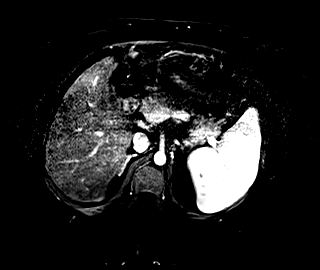
[im 88/88]
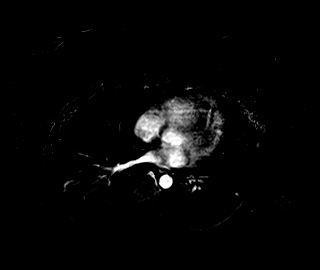

[Series 13: T1 dynamic post-contrast · axial · 3.0mm · 1.25mm/px · z∈[-119,+142]mm · 3 of 88 slices shown (3 of 9)]
[im 1/88]
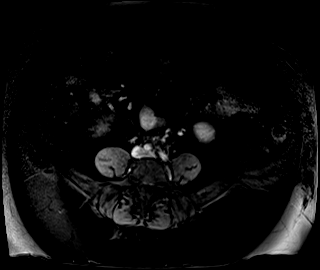
[im 44/88]
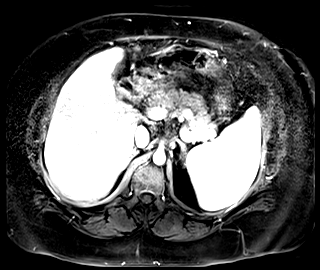
[im 88/88]
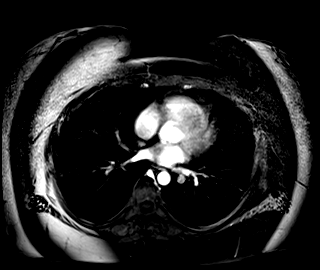

[Series 14: T1 dynamic post-contrast · axial · 3.0mm · 1.25mm/px · z∈[-119,+142]mm · 3 of 88 slices shown (4 of 9)]
[im 1/88]
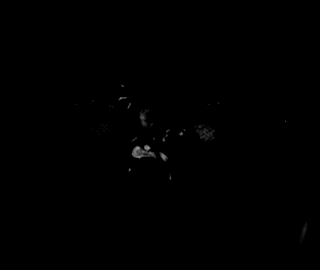
[im 44/88]
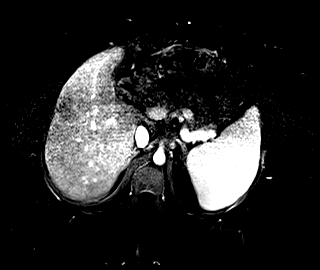
[im 88/88]
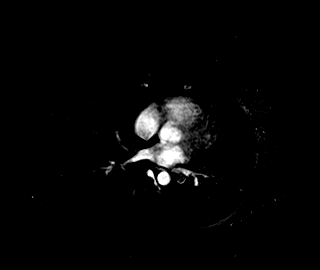

[Series 15: T1 dynamic post-contrast · axial · 3.0mm · 1.25mm/px · z∈[-119,+142]mm · 3 of 88 slices shown (5 of 9)]
[im 1/88]
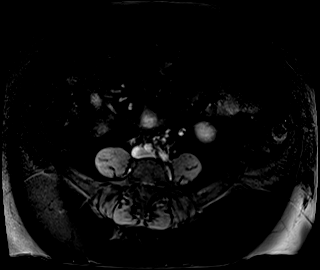
[im 44/88]
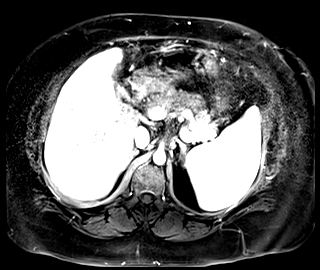
[im 88/88]
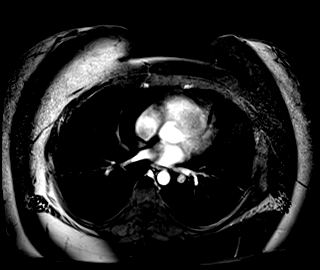

[Series 16: T1 dynamic post-contrast · axial · 3.0mm · 1.25mm/px · z∈[-119,+142]mm · 3 of 88 slices shown (6 of 9)]
[im 1/88]
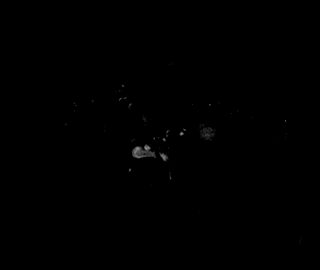
[im 44/88]
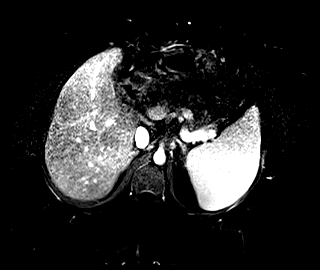
[im 88/88]
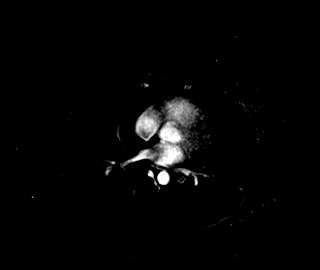

[Series 17: T1 dynamic post-contrast · coronal · 3.0mm · 1.25mm/px · 3 of 80 slices shown (7 of 9)]
[im 1/80]
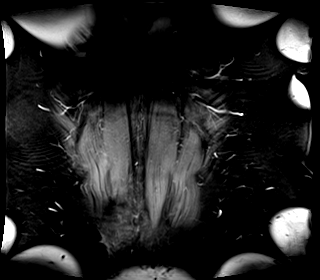
[im 40/80]
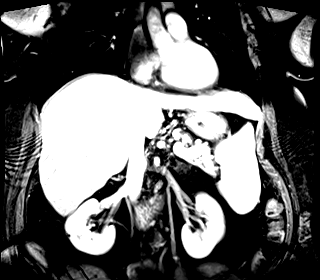
[im 80/80]
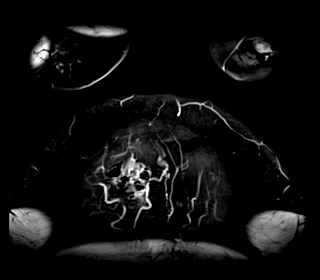

[Series 18: T1 dynamic post-contrast · axial · 3.0mm · 1.25mm/px · z∈[-119,+142]mm · 3 of 88 slices shown (8 of 9)]
[im 1/88]
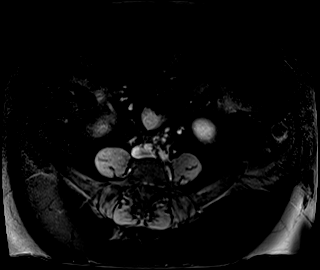
[im 44/88]
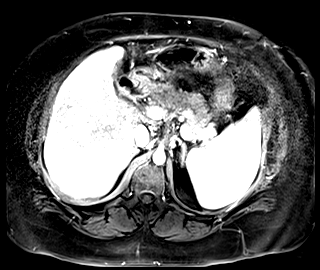
[im 88/88]
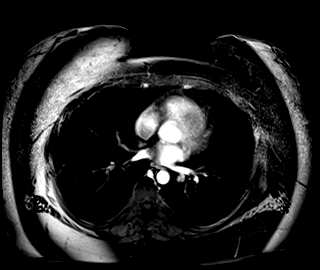

[Series 19: T1 dynamic post-contrast · axial · 3.0mm · 1.25mm/px · z∈[-119,+142]mm · 3 of 88 slices shown (9 of 9)]
[im 1/88]
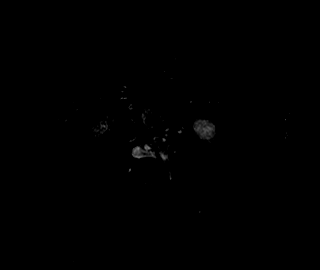
[im 44/88]
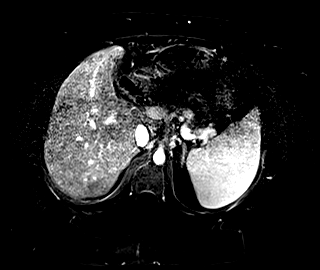
[im 88/88]
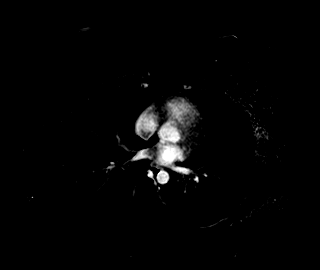

[48 of 48 positions shown; findings below may reference images not displayed]

FINDINGS: Despite efforts by the technologist and patient, motion artifact is
present on today's exam and could not be eliminated. This reduces
exam sensitivity and specificity.

Lower chest: Left lower lobe retro hilar pulmonary nodule on image 3
series 6 measuring 1.1 by 1.2 by 1.1 cm. Subsegmental atelectasis in
both lower lobes.

Hepatobiliary: Diffuse hepatic steatosis.  Hepatic cirrhosis.

The faint hypodensities seen on CT do not demonstrate worrisome
enhancement. There is some vague artifact related areas of
accentuated signal inferiorly in the right hepatic lobe which seem
to ignore the capsular boundary of the liver and hence are
considered artifactual rather than to be true lesions, considered
very unlikely to be significant. No specific worrisome liver lesion
is identified.

Gallbladder absent. No biliary dilatation. The pneumobilia seen on
recent CT in scan is less readily apparent on MRI.

Pancreas:  Unremarkable

Spleen: The spleen measures 10.4 by 8.9 by 14.4 cm (volume = 700
cm^3), compatible with moderate splenomegaly. No focal splenic
lesion identified.

Adrenals/Urinary Tract: Small right renal cysts. Adrenal glands
unremarkable. The patient has known left kidney upper pole
nonobstructive renal calculi, faintly seen today for example on
image 67 series 15.

Stomach/Bowel: Unremarkable

Vascular/Lymphatic: Uphill paraesophageal varices. Gastric varices
noted. Reactive porta hepatis lymph nodes.

Other:  No supplemental non-categorized findings.

Musculoskeletal: Unremarkable
IMPRESSION: 1. 1.2 cm left lower lobe retro hilar pulmonary nodule. Chest CT
recommended for further characterization.
2. Hepatic cirrhosis without findings for worrisome mass lesion.
3. Diffuse hepatic steatosis.
4. Portal venous hypertension with gastric and uphill esophageal
varices.
5. Splenomegaly, splenic volume 700 cubic cm.

## 2020-09-30 MED ORDER — GADOBENATE DIMEGLUMINE 529 MG/ML IV SOLN
20.0000 mL | Freq: Once | INTRAVENOUS | Status: AC | PRN
  Administered 2020-09-30: 20 mL via INTRAVENOUS

## 2020-11-06 ENCOUNTER — Encounter (HOSPITAL_COMMUNITY): Payer: Self-pay | Admitting: Emergency Medicine

## 2020-11-06 ENCOUNTER — Emergency Department (HOSPITAL_COMMUNITY)
Admission: EM | Admit: 2020-11-06 | Discharge: 2020-11-06 | Disposition: A | Discharge status: Home / Self Care | Payer: Medicaid Other | Attending: Emergency Medicine | Admitting: Emergency Medicine

## 2020-11-06 ENCOUNTER — Other Ambulatory Visit: Payer: Self-pay

## 2020-11-06 ENCOUNTER — Emergency Department (HOSPITAL_COMMUNITY): Payer: Medicaid Other

## 2020-11-06 DIAGNOSIS — Z7984 Long term (current) use of oral hypoglycemic drugs: Secondary | ICD-10-CM | POA: Insufficient documentation

## 2020-11-06 DIAGNOSIS — E119 Type 2 diabetes mellitus without complications: Secondary | ICD-10-CM | POA: Diagnosis not present

## 2020-11-06 DIAGNOSIS — M65132 Other infective (teno)synovitis, left wrist: Secondary | ICD-10-CM | POA: Insufficient documentation

## 2020-11-06 DIAGNOSIS — Z79899 Other long term (current) drug therapy: Secondary | ICD-10-CM | POA: Diagnosis not present

## 2020-11-06 DIAGNOSIS — M25532 Pain in left wrist: Secondary | ICD-10-CM

## 2020-11-06 DIAGNOSIS — Z8542 Personal history of malignant neoplasm of other parts of uterus: Secondary | ICD-10-CM | POA: Diagnosis not present

## 2020-11-06 DIAGNOSIS — Z794 Long term (current) use of insulin: Secondary | ICD-10-CM | POA: Diagnosis not present

## 2020-11-06 DIAGNOSIS — E890 Postprocedural hypothyroidism: Secondary | ICD-10-CM | POA: Diagnosis not present

## 2020-11-06 DIAGNOSIS — M778 Other enthesopathies, not elsewhere classified: Secondary | ICD-10-CM

## 2020-11-06 IMAGING — CR DG WRIST COMPLETE 3+V*L*
4 series · 4 of 4 positions shown · non-contrast
Comparison: None.

CLINICAL DATA: Pain at hamate.  No injury.

EXAM:
LEFT WRIST - COMPLETE 3+ VIEW

[x wrist pa left]
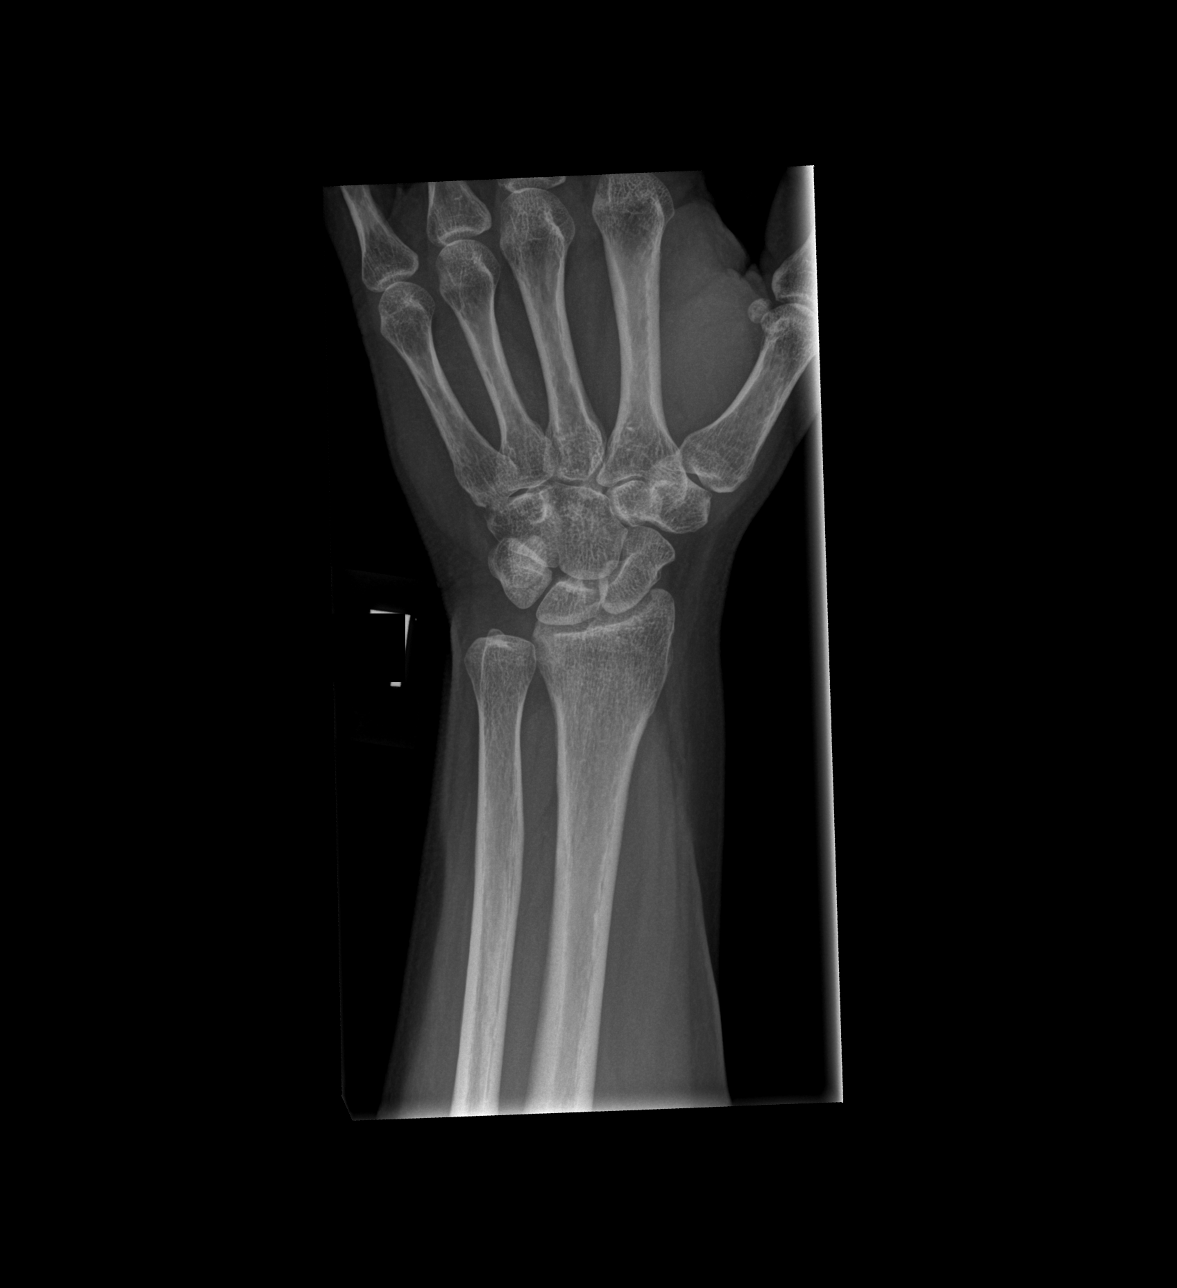

[x wrist obl left]
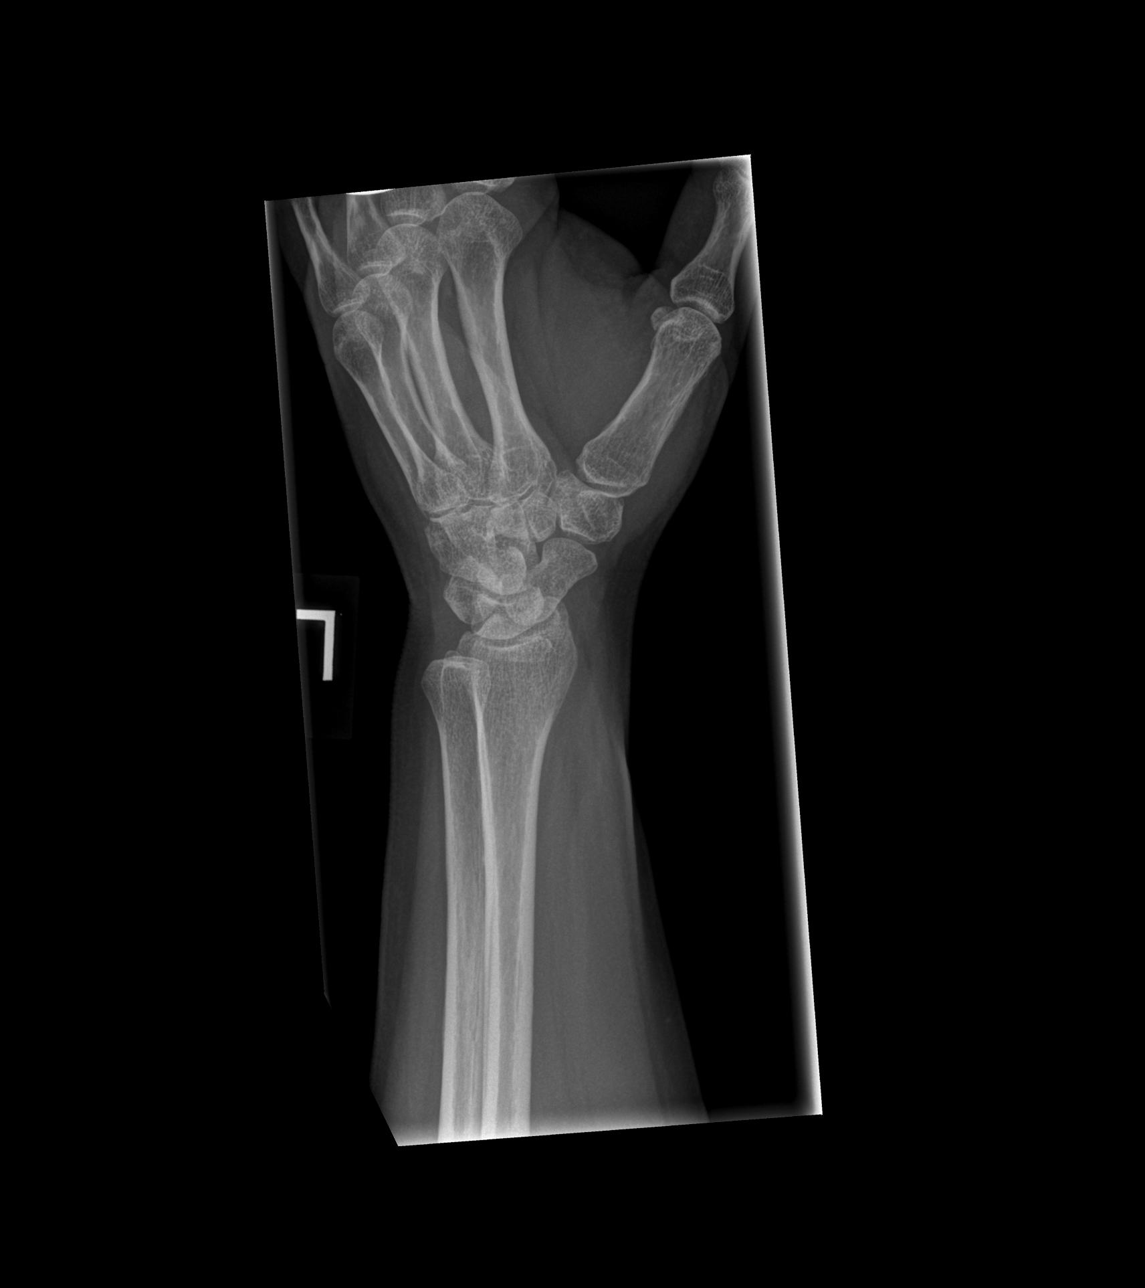

[x wrist lat left]
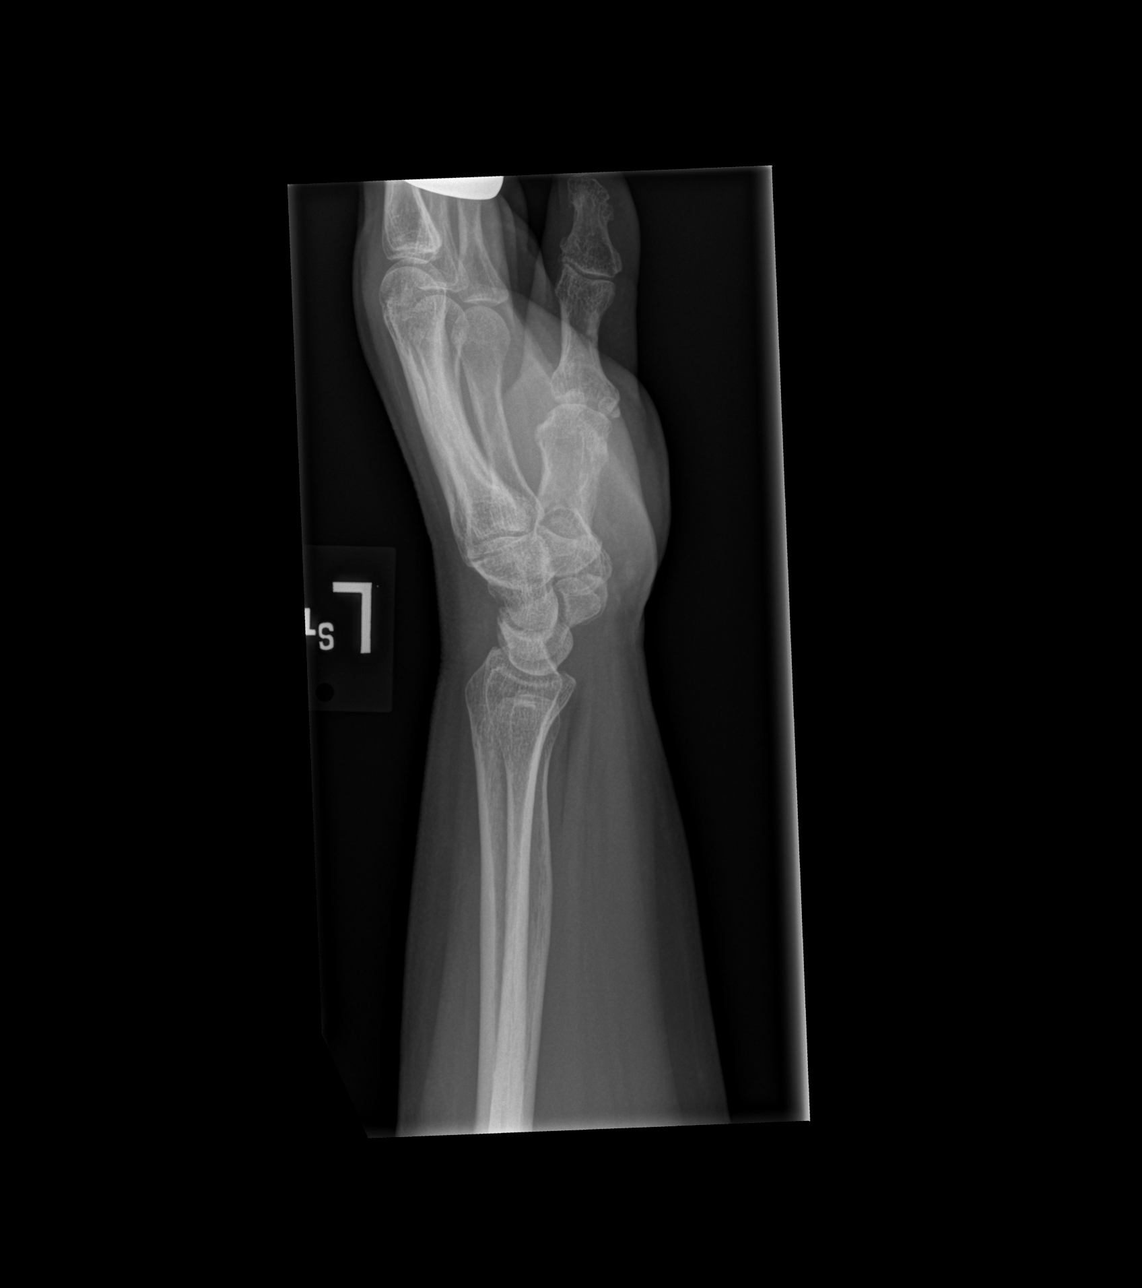

[x wrist navicular view left]
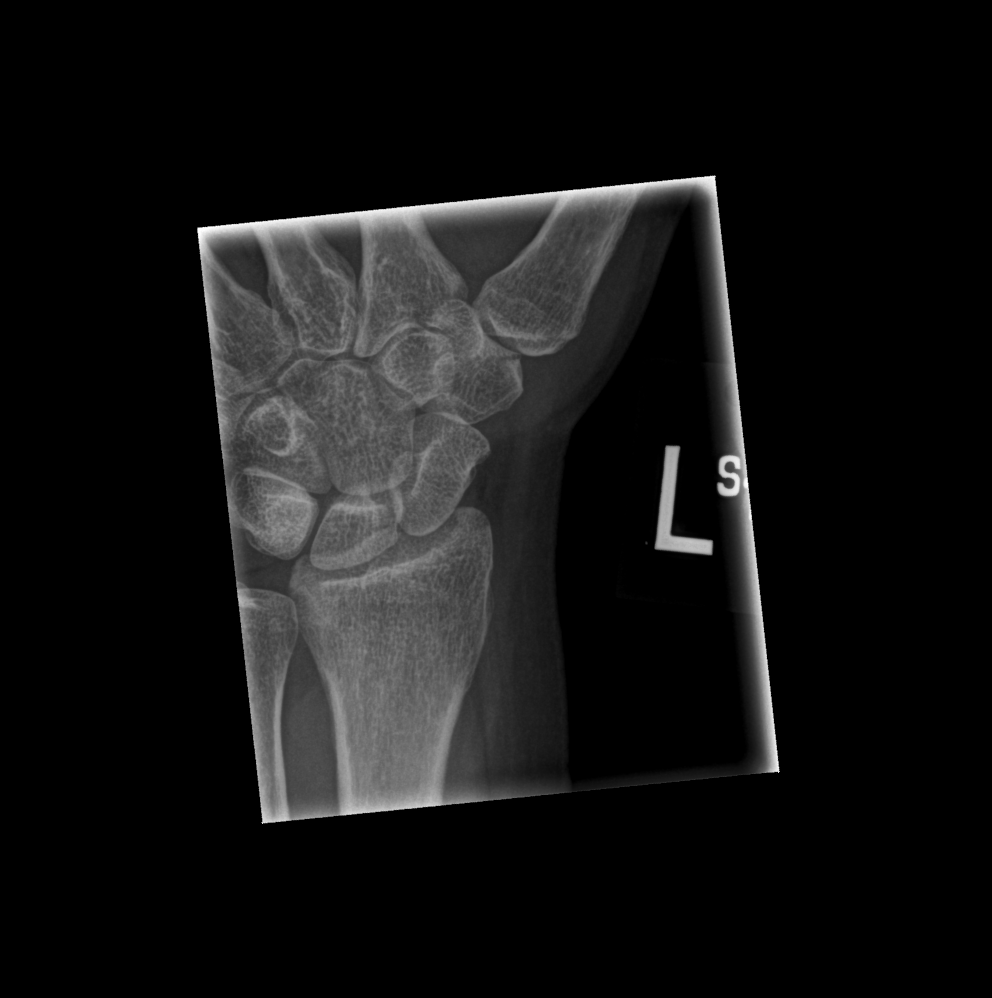

[4 of 4 positions shown; findings below may reference images not displayed]

FINDINGS: There is no evidence of fracture or dislocation. There is no
evidence of arthropathy or other focal bone abnormality. Soft
tissues are unremarkable.
IMPRESSION: Negative.

## 2020-11-06 NOTE — ED Triage Notes (Signed)
Patient reports left wrist pain since last night without injury. Reports tenderness on palpation.

## 2020-11-06 NOTE — Discharge Instructions (Signed)
1.  Wear your wrist splint and elevate your wrist and ice as per instructions in your discharge set.  May apply well wrapped ice pack for about 20 minutes every 2 hours. 2.  At this time it appears that you have tendinitis in the wrist.  This is likely from an activity or use that has caused pain.  Currently is there is no swelling or redness. 3.  Schedule a recheck with your doctor within the next week.  You will need to recheck to determine if you need to continue wearing the splint or if you need referral to orthopedic specialist. 4.  You may take over-the-counter extra strength Tylenol and over-the-counter Aleve twice daily for pain.  These 2 medications can be taken together if needed.  If you get sufficient pain relief from one of the medications, continue with one alone.

## 2020-11-06 NOTE — ED Provider Notes (Signed)
[facility] DEPT Provider Note   CSN: [id number] Arrival date & time: 11/06/20  1217     History Chief Complaint  Patient presents with  . Wrist Pain    Rhonda Higgins is a 50 y.o. female.  HPI Patient reports yesterday she started noticing pain in her left wrist.  She points out a focal area at the ulnar side of the wrist close to the hamate.  Reports that it is worse with moving the wrist back-and-forth.  No swelling or redness.  Patient denies similar problem previously.  No other joints red or swollen.  No injury.  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  She denies any repetitive work or heavy lifting.    Past Medical History:  Diagnosis Date  . Adenocarcinoma (HCC)    endometrial, FIGO GRADE 1  . Allergic rhinitis   . Atypical chest pain    History of  . Depression   . Elevated liver enzymes   . GERD (gastroesophageal reflux disease)   . Hematuria   . History of endometrial cancer 08-02-2009   oncologist-  dr brewster/ [facility] George and dr kinard/  no recurrence   endometrial adenocarinoma Stage 1B, Grade 1, FIGO--  s/p  TAH w/ BSO and pelvic lymph node dissection's and radiation therapy  . History of kidney stones   . History of radiation therapy    2011  pelvic intracavity brachytherapy treatment's for endometrial carcinoma  . History of thyroid nodule    multinodular goiter s/p  total thyroidectomy 11-19-2015  per pathology -  adenomatoid nodules  . Hyperlipidemia   . Hypothyroidism, postsurgical   . Insulin dependent diabetes mellitus    Type 2  . Left ureteral stone   . Obesity   . OSA (obstructive sleep apnea)    severe OSA  per study 03-08-2010--  noncomplant cpap (no more sleep apnes since lost weight 2019)  . Overactive bladder   . Personality disorder ([place])   . Polyphagia(783.6)   . PONV (postoperative nausea and vomiting)    after ear surgery only one time  . Right lower quadrant pain   . Urgency of urination   . UTI  (urinary tract infection)     Patient Active Problem List   Diagnosis Date Noted  . Elevated levels of transaminase & lactic acid dehydrogenase 08/03/2020  . Epigastric pain 08/03/2020  . Liver nodule 08/03/2020  . Obstruction of bile duct 08/03/2020  . Cirrhosis of liver ([place]) 07/29/2020  . Gastroparesis 07/29/2020  . Irritable bowel syndrome 07/29/2020  . Steatosis of liver 07/29/2020  . MDD (major depressive disorder), recurrent episode, severe ([place]) 04/01/2020  . Borderline personality disorder ([place]) 04/01/2020  . Intermittent explosive disorder in adult 04/01/2020  . Cholelithiasis with chronic cholecystitis 09/06/2017  . Multinodular goiter (nontoxic) 11/17/2015  . Diabetes mellitus without complication ([place]) 70/26/3785  . GERD (gastroesophageal reflux disease) 07/19/2014  . HLD (hyperlipidemia) 02/09/2010  . Obstructive sleep apnea 02/09/2010  . Allergic rhinitis 02/09/2010  . History of uterine cancer 02/09/2010    Past Surgical History:  Procedure Laterality Date  . CARDIOVASCULAR STRESS TEST  06/09/2008   normal nuclear study w/ no ischemia/  normal LV function and wall motion , ef 83%  . CHOLECYSTECTOMY N/A 09/06/2017   Procedure: LAPAROSCOPIC CHOLECYSTECTOMY WITH INTRAOPERATIVE CHOLANGIOGRAM;  Surgeon: Armandina Gemma, MD;  Location: [facility];  Service: General;  Laterality: N/A;  . CYSTOSCOPY/RETROGRADE/URETEROSCOPY/STONE EXTRACTION WITH BASKET Left 03/08/2016   Procedure: CYSTOSCOPY/RETROGRADE/URETEROSCOPY/STONE EXTRACTION WITH BASKET, STENT PLACEMENT;  Surgeon:  Rana Snare, MD;  Location: [facility];  Service: Urology;  Laterality: Left;  . ENDOMETRIAL BIOPSY    . ERCP N/A 09/07/2017   Procedure: ENDOSCOPIC RETROGRADE CHOLANGIOPANCREATOGRAPHY (ERCP);  Surgeon: [name], MD;  Location: [facility];  Service: Gastroenterology;  Laterality: N/A;  . ESOPHAGOGASTRODUODENOSCOPY (EGD) WITH PROPOFOL N/A 03/16/2020   Procedure: ESOPHAGOGASTRODUODENOSCOPY (EGD)  WITH PROPOFOL;  Surgeon: [name], MD;  Location: [facility];  Service: Gastroenterology;  Laterality: N/A;  unable to locate ampulla, aborted ERCP, changed to EGD  . HOLMIUM LASER APPLICATION Left 07/11/4823   Procedure: HOLMIUM LASER APPLICATION;  Surgeon: Rana Snare, MD;  Location[facility]  Service: Urology;  Laterality: Left;  . MOUTH SURGERY    . MYRINGECOTMY W/ REMOVAL MIDDLE EAR CHOLESTEATOMA TYPE 1 FASICA TYMPANOPLASTY  09/13/2000  . ROBOTIC ASSISTED TOTAL HYSTERECTOMY WITH BILATERAL SALPINGO OOPHERECTOMY  08-02-2009   at [facility]  dr Denman George   w/  Bilateral pelvic and para aortic lymph node dissection's  . THYROIDECTOMY N/A 11/19/2015   Procedure: TOTAL THYROIDECTOMY;  Surgeon: Armandina Gemma, MD;  Location: [facility];  Service: General;  Laterality: N/A;  . TONSILLECTOMY  age 27  . TRANSTHORACIC ECHOCARDIOGRAM  07/19/2014   ef 55-60%/  trivial TR  . TYMPANOPLASTY Right 1993     OB History    Gravida  0   Para      Term      Preterm      AB      Living        SAB      IAB      Ectopic      Multiple      Live Births              Family History  Problem Relation Age of Onset  . Heart disease Sister   . Heart attack Brother   . Heart disease Brother   . Heart disease Sister   . Diabetes Sister   . Breast cancer Sister   . Asthma Mother   . Heart disease Mother   . Diabetes Mother   . Emphysema Mother   . Hypertension Mother   . Stroke Mother   . Prostate cancer Father     Social History   Tobacco Use  . Smoking status: Never Smoker  . Smokeless tobacco: Never Used  Vaping Use  . Vaping Use: Never used  Substance Use Topics  . Alcohol use: No  . Drug use: No    Home Medications Prior to Admission medications   Medication Sig Start Date End Date Taking? Authorizing Provider  ACCU-CHEK GUIDE test strip  03/23/20   [provider]  Accu-Chek Softclix Lancets lancets daily. 07/21/20   [provider]   atorvastatin (LIPITOR) 40 MG tablet Take 40 mg by mouth daily. 08/22/20   [provider]  buPROPion (WELLBUTRIN XL) 150 MG 24 hr tablet Take 150 mg by mouth every morning. 07/14/20   [provider]  Cetirizine HCl (ZYRTEC ALLERGY) 10 MG CAPS Take 10 mg by mouth daily.    [provider]  gabapentin (NEURONTIN) 400 MG capsule Take 400-1,200 mg by mouth See admin instructions. Takes 400 mg in the morning 400 mg in the afternoon and 1200 mg at night    [provider]  glimepiride (AMARYL) 4 MG tablet Take 4 mg by mouth 2 (two) times daily.     [provider]  hydrOXYzine (ATARAX/VISTARIL) 25 MG tablet Take 1 tablet (  25 mg total) by mouth 3 (three) times daily as needed for anxiety. 04/04/20   Connye Burkitt, NP  hyoscyamine (LEVSIN) 0.125 MG tablet Take 0.125 mg by mouth 4 (four) times daily. 09/24/20   [provider]  ibuprofen (ADVIL) 200 MG tablet Take 200 mg by mouth every 6 (six) hours as needed for mild pain.    [provider]  JANUMET 50-1000 MG tablet Take 1 tablet by mouth 2 (two) times daily. 05/24/20   [provider]  LANTUS SOLOSTAR 100 UNIT/ML Solostar Pen Inject 30 Units into the skin 2 (two) times daily. 04/10/20   [provider]  linagliptin (TRADJENTA) 5 MG TABS tablet Take 1 tablet (5 mg total) by mouth daily with breakfast. Patient not taking: No sig reported 04/02/20   Money, Lowry Ram, FNP  liraglutide (VICTOZA) 18 MG/3ML SOPN Inject 1.8 mg into the skin daily. 04/02/20   Money, Lowry Ram, FNP  lisinopril (PRINIVIL,ZESTRIL) 5 MG tablet Take 5 mg by mouth daily.    [provider]  metFORMIN (GLUCOPHAGE) 1000 MG tablet Take 1 tablet (1,000 mg total) by mouth 2 (two) times daily with a meal. Patient not taking: No sig reported 04/01/20   Money, Lowry Ram, FNP  methocarbamol (ROBAXIN) 500 MG tablet 1 po q 8 prn Patient not taking: No sig reported 04/28/20   Magnant, Charles L, PA-C   pantoprazole (PROTONIX) 40 MG tablet Take 40 mg by mouth 2 (two) times daily.     [provider]  Peppermint Oil (IBGARD) 90 MG CPCR Take 180 mg by mouth daily. 12/08/19   [provider]  polyethylene glycol-electrolytes (NULYTELY) 420 g solution Take 4,000 mLs by mouth once. 11/13/18   [provider]  predniSONE (STERAPRED UNI-PAK 21 TAB) 5 MG (21) TBPK tablet Take dosepak as directed Patient not taking: No sig reported 04/28/20   Magnant, Charles L, PA-C  risperiDONE (RISPERDAL) 2 MG tablet Take 2 mg by mouth at bedtime.  08/22/16   [provider]  SYNTHROID 175 MCG tablet Take 175 mcg by mouth daily. 07/27/20   [provider]  traMADol (ULTRAM) 50 MG tablet Take 1 tablet (50 mg total) by mouth every 12 (twelve) hours as needed. Patient taking differently: Take 50 mg by mouth every 12 (twelve) hours as needed for moderate pain. 06/16/20 06/16/21  Magnant, Charles L, PA-C  traZODone (DESYREL) 50 MG tablet Take 1 tablet (50 mg total) by mouth at bedtime as needed for sleep. 04/04/20   Connye Burkitt, NP  Vitamin D, Cholecalciferol, 25 MCG (1000 UT) TABS Take 1,000 Units by mouth daily.    [provider]    Allergies    Hydrocodone, Tape, and Codeine  Review of Systems   Review of Systems Constitutional: No fever no chills no malaise Respiratory: No shortness of breath no chest pain no cough Physical Exam Updated Vital Signs BP 116/76   Pulse 73   Temp 97.8 F (36.6 C)   Resp 16   SpO2 100%   Physical Exam Constitutional:      Comments: Alert nontoxic well in appearance  Eyes:     Extraocular Movements: Extraocular movements intact.  Pulmonary:     Effort: Pulmonary effort is normal.  Musculoskeletal:     Comments: Left wrist examined no visible swelling or erythema.  Some focal tenderness at the medial aspect of the hamate and wrist.  This is worse with flexion and extension.  Not significantly worse with side to side  inversion eversion motion.  Pulse 2+.  Hand warm and dry.  Cap refill and neurologic function intact.  No pain in the forearm or elbow  Neurological:     General: No focal deficit present.     Mental Status: She is oriented to person, place, and time.     Coordination: Coordination normal.     ED Results / Procedures / Treatments   Labs (all labs ordered are listed, but only abnormal results are displayed) Labs Reviewed - No data to display  EKG None  Radiology DG Wrist Complete Left  Result Date: 11/06/2020 CLINICAL DATA:  Pain at hamate.  No injury. EXAM: LEFT WRIST - COMPLETE 3+ VIEW COMPARISON:  None. FINDINGS: There is no evidence of fracture or dislocation. There is no evidence of arthropathy or other focal bone abnormality. Soft tissues are unremarkable. IMPRESSION: Negative. Electronically Signed   By: Dorise Bullion III M.D   On: 11/06/2020 14:52    Procedures Procedures   Medications Ordered in ED Medications - No data to display  ED Course  I have reviewed the triage vital signs and the nursing notes.  Pertinent labs & imaging results that were available during my care of the patient were reviewed by me and considered in my medical decision making (see chart for details).    MDM Rules/Calculators/A&P                          Patient presents as outlined with focus of pain consistent with focal tendinitis of the ulnar aspect of the wrist.  Suspect flexor carpi ulnaris tendinitis.  Currently there is no visible swelling or redness.  Pain is very focal.  X-ray no acute findings.  We will treat conservative splinting and pain medications and follow-up with PCP Final Clinical Impression(s) / ED Diagnoses Final diagnoses:  Left wrist pain  Left wrist tendonitis    Rx / DC Orders ED Discharge Orders    None       Charlesetta Shanks, MD 11/06/20 1527

## 2020-11-06 NOTE — Progress Notes (Signed)
Orthopedic Tech Progress Note Patient Details:  Rhonda Higgins [place] Aug 28, 1970 [id number]  Ortho Devices Type of Ortho Device: Wrist splint Ortho Device/Splint Location: Left Wrist Ortho Device/Splint Interventions: Application   Post Interventions Patient Tolerated: Well   Rhonda Higgins Faithe Ariola 11/06/2020, 3:45 PM

## 2020-11-10 ENCOUNTER — Ambulatory Visit: Payer: Medicaid Other | Admitting: Podiatry

## 2020-11-17 ENCOUNTER — Ambulatory Visit: Payer: Medicaid Other | Admitting: Podiatry

## 2020-11-17 ENCOUNTER — Encounter: Payer: Self-pay | Admitting: Podiatry

## 2020-11-17 ENCOUNTER — Other Ambulatory Visit: Payer: Self-pay

## 2020-11-17 DIAGNOSIS — M79674 Pain in right toe(s): Secondary | ICD-10-CM | POA: Diagnosis not present

## 2020-11-17 DIAGNOSIS — M79675 Pain in left toe(s): Secondary | ICD-10-CM | POA: Diagnosis not present

## 2020-11-17 DIAGNOSIS — B351 Tinea unguium: Secondary | ICD-10-CM | POA: Diagnosis not present

## 2020-11-17 DIAGNOSIS — E1142 Type 2 diabetes mellitus with diabetic polyneuropathy: Secondary | ICD-10-CM | POA: Diagnosis not present

## 2020-11-18 NOTE — Progress Notes (Signed)
  Subjective:  Patient ID: Rhonda Higgins, female    DOB: 1971-04-10,  MRN: [id number]  50 y.o. female presents with at risk foot care with history of diabetic neuropathy and painful thick toenails that are difficult to trim. Pain interferes with ambulation. Aggravating factors include wearing enclosed shoe gear. Pain is relieved with periodic professional debridement.  Patient states she has not checked blood glucose in a few days.  PCP: [name], MD and she has an appointment on today, 11/17/2020.  She notes no new pedal concerns on today's visit.  Review of Systems: Negative except as noted in the HPI.   Allergies  Allergen Reactions  . Hydrocodone Shortness Of Breath and Itching  . Tape Rash    Paper tape is ok  . Codeine Itching    Objective:  There were no vitals filed for this visit. Constitutional Patient is a pleasant 50 y.o. Caucasian female morbidly obese in NAD. AAO x 3.  Vascular Neurovascular status unchanged b/l lower extremities. Capillary refill time to digits immediate b/l. Palpable pedal pulses b/l LE. Pedal hair absent. Lower extremity skin temperature gradient within normal limits. No pain with calf compression b/l. No cyanosis or clubbing noted.  Neurologic Normal speech. Pt has subjective symptoms of neuropathy. Protective sensation intact 5/5 intact bilaterally with 10g monofilament b/l. Vibratory sensation intact b/l.  Dermatologic Pedal skin with normal turgor, texture and tone bilaterally. No open wounds bilaterally. No interdigital macerations bilaterally. Toenails 1-5 b/l elongated, discolored, dystrophic, thickened, crumbly with subungual debris and tenderness to dorsal palpation.  Orthopedic: Normal muscle strength 5/5 to all lower extremity muscle groups bilaterally. No pain crepitus or joint limitation noted with ROM b/l. No gross bony deformities bilaterally. Patient ambulates independent of any assistive aids.   Hemoglobin A1C Latest Ref  Rng & Units 04/03/2020  HGBA1C 4.8 - 5.6 % 7.4(H)  Some recent data might be hidden   Assessment:   1. Pain due to onychomycosis of toenails of both feet   2. Diabetic peripheral neuropathy associated with type 2 diabetes mellitus ([place])    Plan:  Patient was evaluated and treated and all questions answered.  Onychomycosis with pain -Nails palliatively debridement as below. -Educated on self-care  Procedure: Nail Debridement Rationale: Pain Type of Debridement: manual, sharp debridement. Instrumentation: Nail nipper, rotary burr. Number of Nails: 10  -Examined patient. -No new findings. No new orders. -Patient to continue soft, supportive shoe gear daily. -Toenails 1-5 b/l were debrided in length and girth with sterile nail nippers and dremel without iatrogenic bleeding.  -Patient to report any pedal injuries to medical professional immediately. -Patient/POA to call should there be question/concern in the interim.  Return in about 3 months (around 02/17/2021).  Marzetta Board, DPM

## 2020-11-24 ENCOUNTER — Encounter (HOSPITAL_COMMUNITY): Payer: Self-pay | Admitting: Emergency Medicine

## 2020-11-24 ENCOUNTER — Emergency Department (HOSPITAL_COMMUNITY)
Admission: EM | Admit: 2020-11-24 | Discharge: 2020-11-24 | Disposition: A | Discharge status: Home / Self Care | Payer: Medicaid Other | Attending: Emergency Medicine | Admitting: Emergency Medicine

## 2020-11-24 ENCOUNTER — Emergency Department (HOSPITAL_COMMUNITY): Payer: Medicaid Other

## 2020-11-24 ENCOUNTER — Other Ambulatory Visit: Payer: Self-pay

## 2020-11-24 DIAGNOSIS — Z7984 Long term (current) use of oral hypoglycemic drugs: Secondary | ICD-10-CM | POA: Insufficient documentation

## 2020-11-24 DIAGNOSIS — R079 Chest pain, unspecified: Secondary | ICD-10-CM

## 2020-11-24 DIAGNOSIS — E119 Type 2 diabetes mellitus without complications: Secondary | ICD-10-CM | POA: Diagnosis not present

## 2020-11-24 DIAGNOSIS — I10 Essential (primary) hypertension: Secondary | ICD-10-CM | POA: Insufficient documentation

## 2020-11-24 DIAGNOSIS — R0602 Shortness of breath: Secondary | ICD-10-CM | POA: Insufficient documentation

## 2020-11-24 DIAGNOSIS — R600 Localized edema: Secondary | ICD-10-CM | POA: Diagnosis not present

## 2020-11-24 DIAGNOSIS — Z8542 Personal history of malignant neoplasm of other parts of uterus: Secondary | ICD-10-CM | POA: Insufficient documentation

## 2020-11-24 DIAGNOSIS — R0789 Other chest pain: Secondary | ICD-10-CM | POA: Diagnosis present

## 2020-11-24 DIAGNOSIS — Z79899 Other long term (current) drug therapy: Secondary | ICD-10-CM | POA: Insufficient documentation

## 2020-11-24 DIAGNOSIS — Z794 Long term (current) use of insulin: Secondary | ICD-10-CM | POA: Diagnosis not present

## 2020-11-24 LAB — CBC WITH DIFFERENTIAL/PLATELET
Abs Immature Granulocytes: 0.01 10*3/uL (ref 0.00–0.07)
Basophils Absolute: 0 10*3/uL (ref 0.0–0.1)
Basophils Relative: 1 %
Eosinophils Absolute: 0.1 10*3/uL (ref 0.0–0.5)
Eosinophils Relative: 1 %
HCT: 39 % (ref 36.0–46.0)
Hemoglobin: 11.9 g/dL — ABNORMAL LOW (ref 12.0–15.0)
Immature Granulocytes: 0 %
Lymphocytes Relative: 29 %
Lymphs Abs: 1 10*3/uL (ref 0.7–4.0)
MCH: 24.5 pg — ABNORMAL LOW (ref 26.0–34.0)
MCHC: 30.5 g/dL (ref 30.0–36.0)
MCV: 80.2 fL (ref 80.0–100.0)
Monocytes Absolute: 0.2 10*3/uL (ref 0.1–1.0)
Monocytes Relative: 7 %
Neutro Abs: 2.2 10*3/uL (ref 1.7–7.7)
Neutrophils Relative %: 62 %
Platelets: 102 10*3/uL — ABNORMAL LOW (ref 150–400)
RBC: 4.86 MIL/uL (ref 3.87–5.11)
RDW: 16.3 % — ABNORMAL HIGH (ref 11.5–15.5)
WBC: 3.5 10*3/uL — ABNORMAL LOW (ref 4.0–10.5)
nRBC: 0 % (ref 0.0–0.2)

## 2020-11-24 LAB — COMPREHENSIVE METABOLIC PANEL
ALT: 43 U/L (ref 0–44)
AST: 51 U/L — ABNORMAL HIGH (ref 15–41)
Albumin: 3.6 g/dL (ref 3.5–5.0)
Alkaline Phosphatase: 84 U/L (ref 38–126)
Anion gap: 4 — ABNORMAL LOW (ref 5–15)
BUN: 7 mg/dL (ref 6–20)
CO2: 27 mmol/L (ref 22–32)
Calcium: 8.1 mg/dL — ABNORMAL LOW (ref 8.9–10.3)
Chloride: 108 mmol/L (ref 98–111)
Creatinine, Ser: 0.5 mg/dL (ref 0.44–1.00)
GFR, Estimated: >60 mL/min (ref >60)
Glucose, Bld: 232 mg/dL — ABNORMAL HIGH (ref 70–99)
Potassium: 4 mmol/L (ref 3.5–5.1)
Sodium: 139 mmol/L (ref 135–145)
Total Bilirubin: 0.6 mg/dL (ref 0.3–1.2)
Total Protein: 6.6 g/dL (ref 6.5–8.1)

## 2020-11-24 LAB — TROPONIN I (HIGH SENSITIVITY)
Troponin I (High Sensitivity): <2 ng/L (ref <18)
Troponin I (High Sensitivity): <2 ng/L (ref <18)

## 2020-11-24 LAB — D-DIMER, QUANTITATIVE: D-Dimer, Quant: 0.39 ug/mL-FEU (ref 0.00–0.50)

## 2020-11-24 LAB — BRAIN NATRIURETIC PEPTIDE: B Natriuretic Peptide: 42.3 pg/mL (ref 0.0–100.0)

## 2020-11-24 IMAGING — CR DG CHEST 2V
2 series · 2 of 2 positions shown · non-contrast
Comparison: Chest x-ray of [DATE].

CLINICAL DATA: Chest pain shortness of breath.

EXAM:
CHEST - 2 VIEW

[w chest pa]
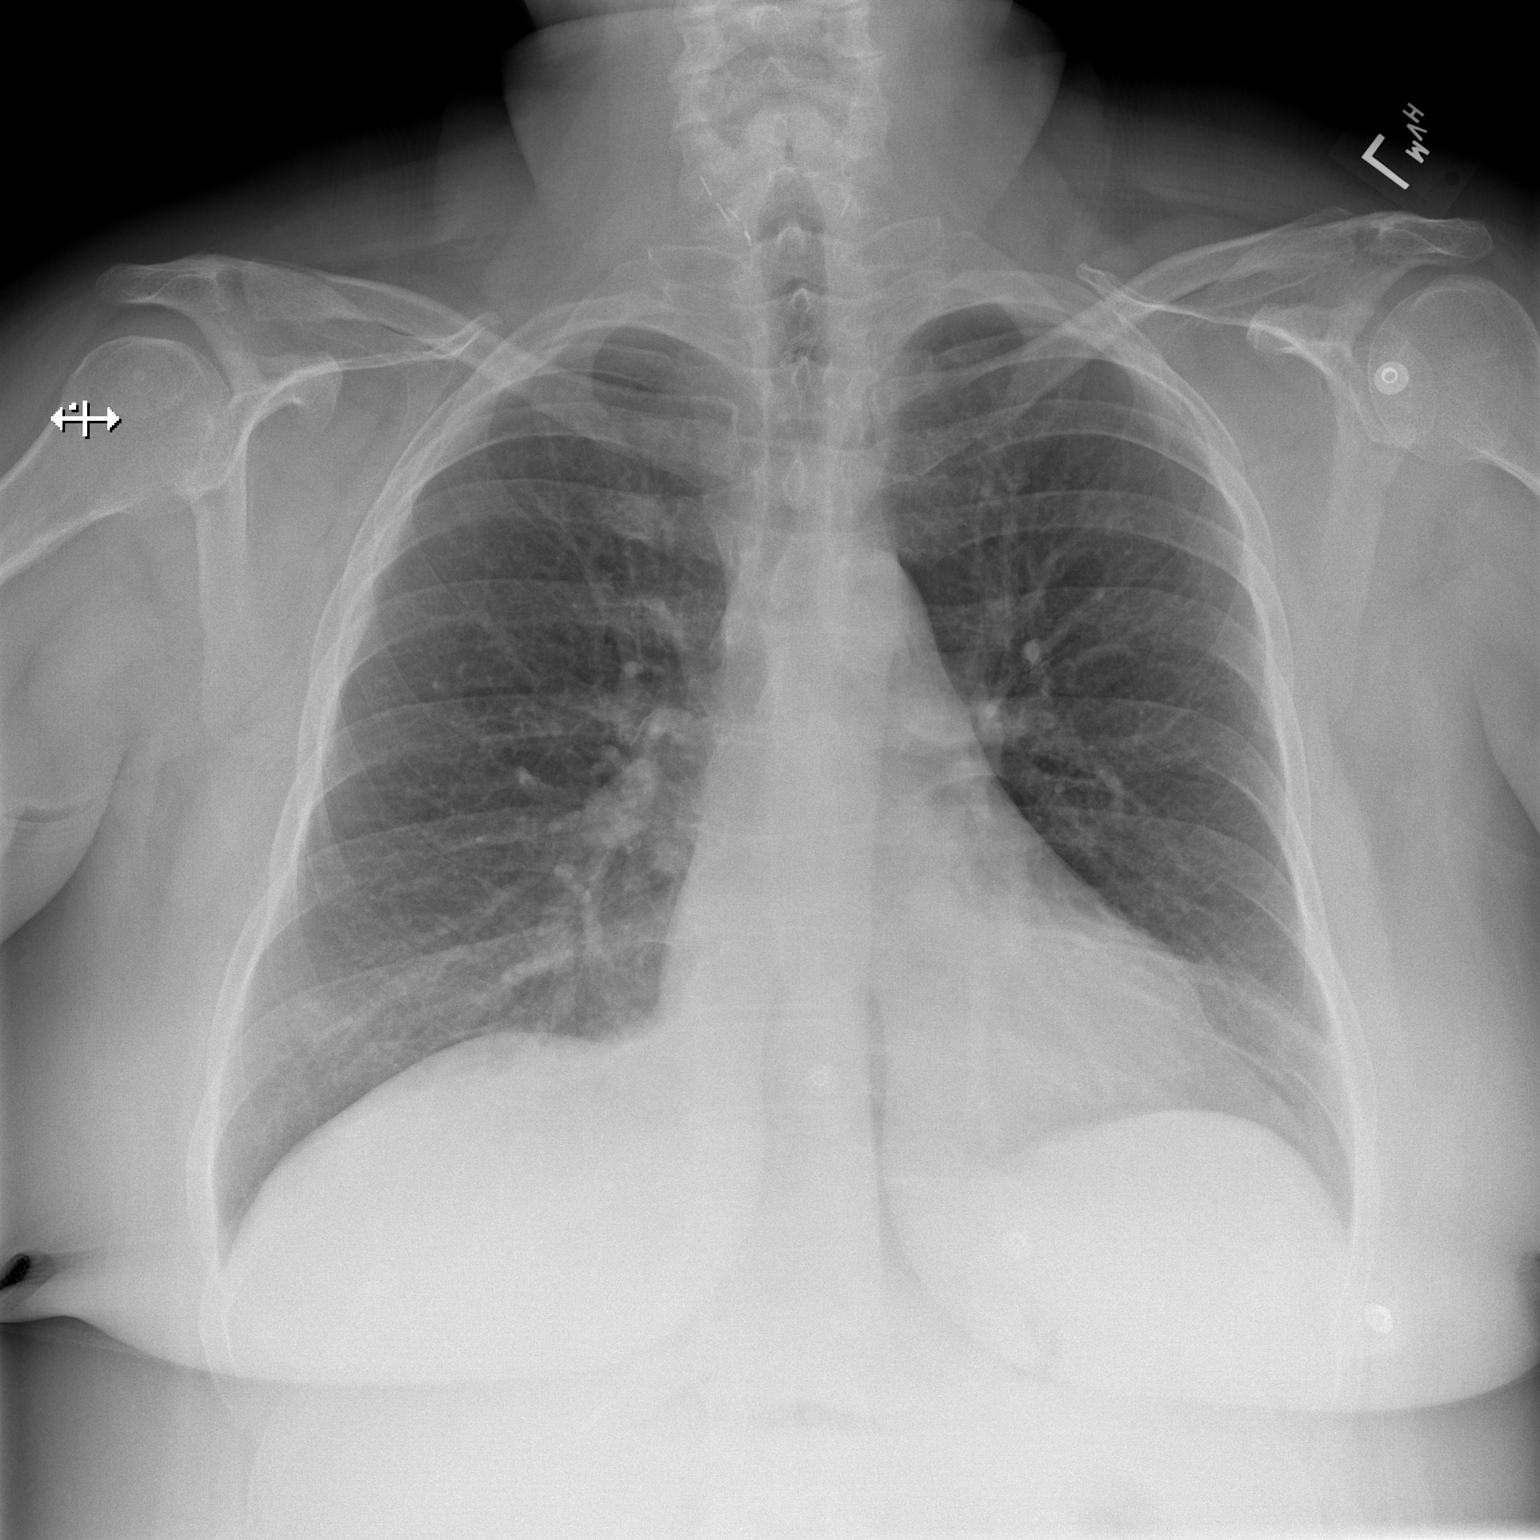

[w chest lat]
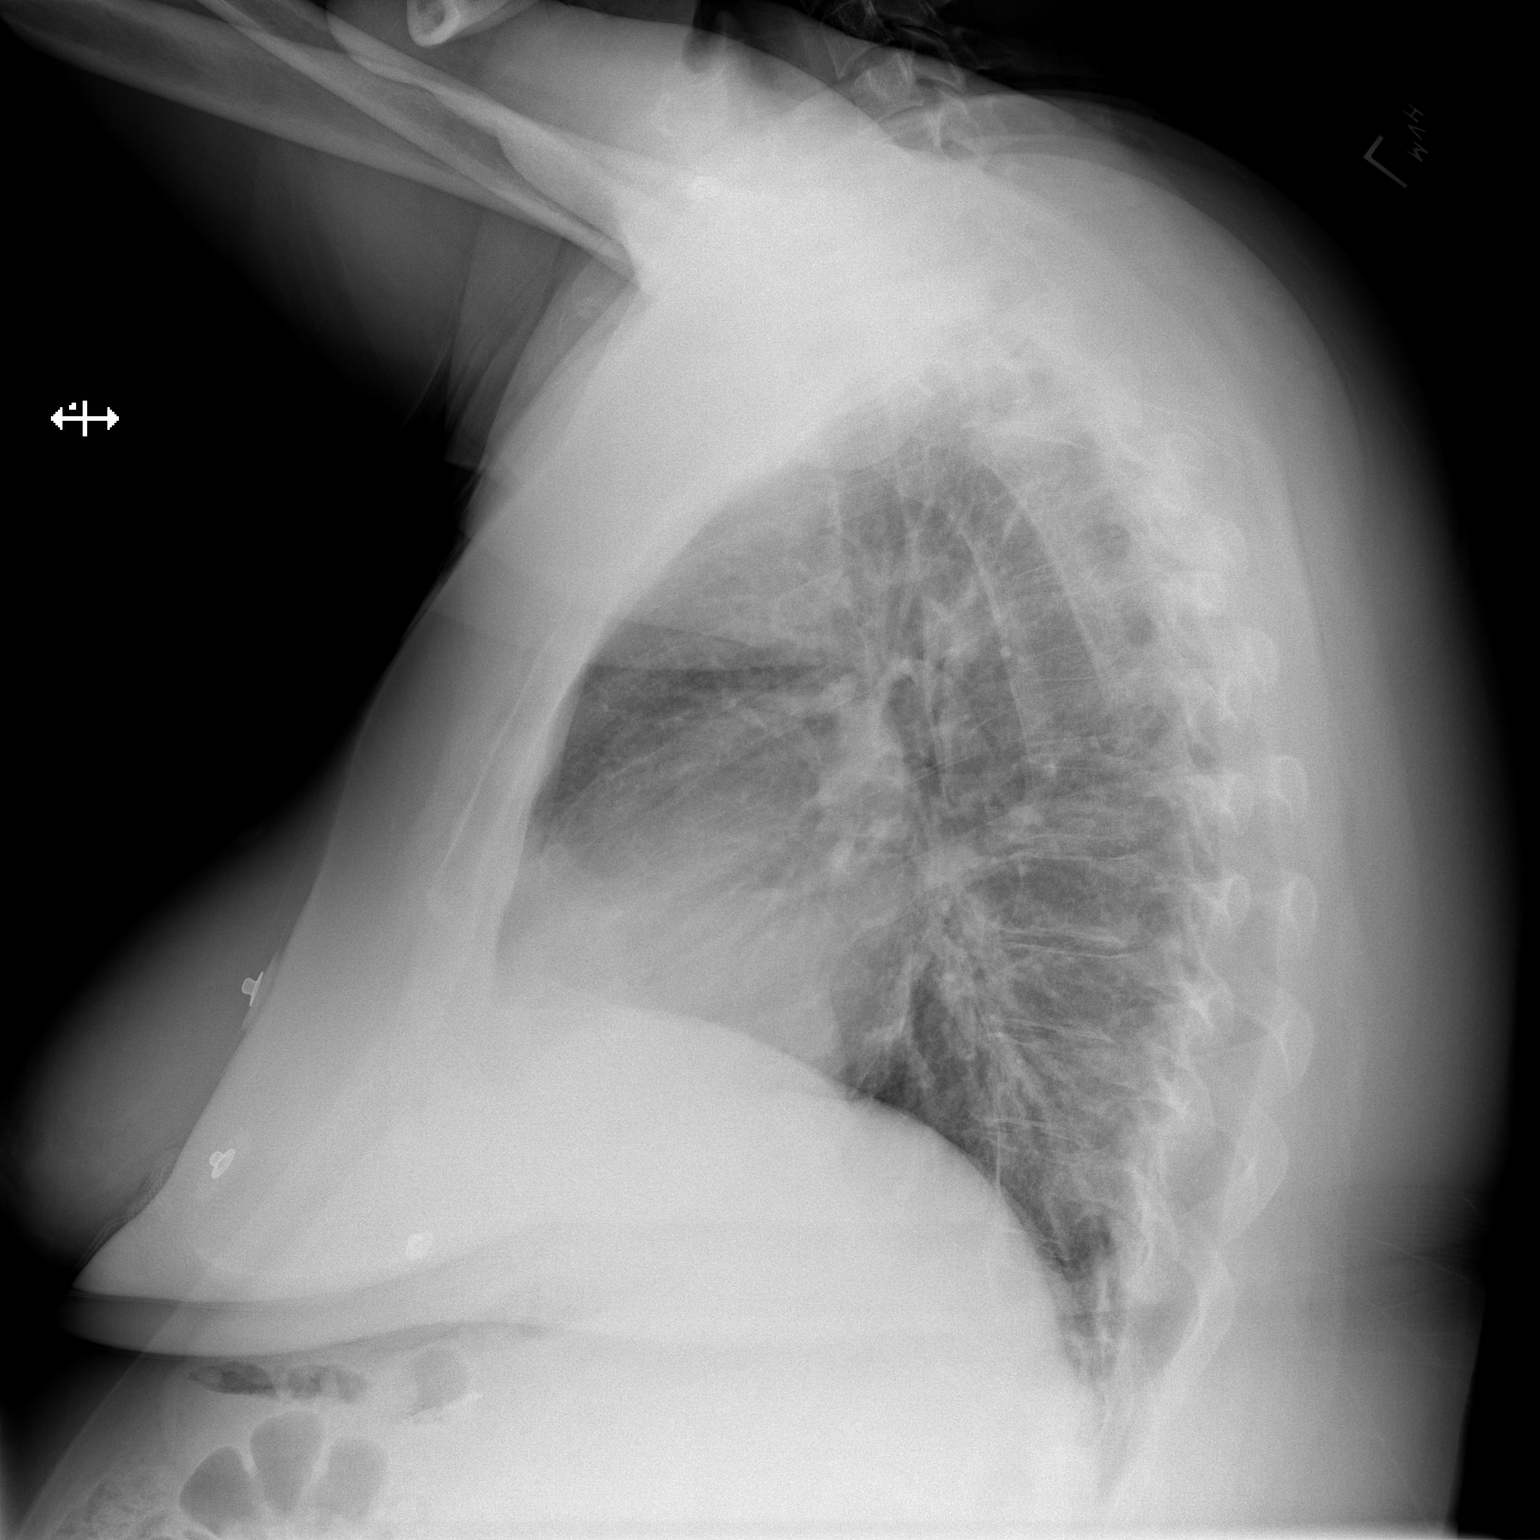

[2 of 2 positions shown; findings below may reference images not displayed]

FINDINGS: Trachea midline. Cardiomediastinal contours and hilar structures are
normal.

Lungs are clear aside from linear opacities at the LEFT lung base.
No sign of lobar consolidative changes. No visible pneumothorax. No
pleural effusion.

On limited assessment no acute skeletal process.
IMPRESSION: Linear opacities at the LEFT lung base, likely atelectasis.

## 2020-11-24 MED ORDER — NAPROXEN 375 MG PO TABS: 375.0000 mg | ORAL_TABLET | Freq: Two times a day (BID) | ORAL | 0 refills | Status: DC

## 2020-11-24 NOTE — ED Provider Notes (Signed)
[facility] DEPT Provider Note   CSN: [id number] Arrival date & time: 11/24/20  1611     History Chief Complaint  Patient presents with  . Shortness of Breath    Rhonda Higgins is a 50 y.o. female.  HPI  HPI: A 50 year old patient with a history of treated diabetes, hypertension, hypercholesterolemia and obesity presents for evaluation of chest pain. Initial onset of pain was more than 6 hours ago. The patient's chest pain is worse with exertion. The patient's chest pain is middle- or left-sided, is not well-localized, is not described as heaviness/pressure/tightness, is not sharp and does not radiate to the arms/jaw/neck. The patient does not complain of nausea and denies diaphoresis. The patient has a family history of coronary artery disease in a first-degree relative with onset less than age 7. The patient has no history of stroke, has no history of peripheral artery disease and has not smoked in the past 90 days. Patient presents to the ED with complaints of shortness of breath for the last couple weeks as well as intermittent chest discomfort for the last day.  Patient states she feels it when she takes a deep breath.  She did have some chest discomfort as well when she was walking to the mailbox.  Patient denies any fevers or chills.  She did test negative for COVID recently.  She does not have any history of heart disease or blood clots.  She does not smoke.  Past Medical History:  Diagnosis Date  . Adenocarcinoma (HCC)    endometrial, FIGO GRADE 1  . Allergic rhinitis   . Atypical chest pain    History of  . Depression   . Elevated liver enzymes   . GERD (gastroesophageal reflux disease)   . Hematuria   . History of endometrial cancer 08-02-2009   oncologist-  dr brewster/ Denman George and dr kinard/  no recurrence   endometrial adenocarinoma Stage 1B, Grade 1, FIGO--  s/p  TAH w/ BSO and pelvic lymph node dissection's and radiation therapy   . History of kidney stones   . History of radiation therapy    2011  pelvic intracavity brachytherapy treatment's for endometrial carcinoma  . History of thyroid nodule    multinodular goiter s/p  total thyroidectomy 11-19-2015  per pathology -  adenomatoid nodules  . Hyperlipidemia   . Hypothyroidism, postsurgical   . Insulin dependent diabetes mellitus    Type 2  . Left ureteral stone   . Obesity   . OSA (obstructive sleep apnea)    severe OSA  per study 03-08-2010--  noncomplant cpap (no more sleep apnes since lost weight 2019)  . Overactive bladder   . Personality disorder ([place])   . Polyphagia(783.6)   . PONV (postoperative nausea and vomiting)    after ear surgery only one time  . Right lower quadrant pain   . Urgency of urination   . UTI (urinary tract infection)     Patient Active Problem List   Diagnosis Date Noted  . Elevated levels of transaminase & lactic acid dehydrogenase 08/03/2020  . Epigastric pain 08/03/2020  . Liver nodule 08/03/2020  . Obstruction of bile duct 08/03/2020  . Cirrhosis of liver ([place]) 07/29/2020  . Gastroparesis 07/29/2020  . Irritable bowel syndrome 07/29/2020  . Steatosis of liver 07/29/2020  . MDD (major depressive disorder), recurrent episode, severe ([place]) 04/01/2020  . Borderline personality disorder ([place]) 04/01/2020  . Intermittent explosive disorder in adult 04/01/2020  . Cholelithiasis with chronic  cholecystitis 09/06/2017  . Multinodular goiter (nontoxic) 11/17/2015  . Diabetes mellitus without complication ([place]) 17/61/6073  . GERD (gastroesophageal reflux disease) 07/19/2014  . HLD (hyperlipidemia) 02/09/2010  . Obstructive sleep apnea 02/09/2010  . Allergic rhinitis 02/09/2010  . History of uterine cancer 02/09/2010    Past Surgical History:  Procedure Laterality Date  . CARDIOVASCULAR STRESS TEST  06/09/2008   normal nuclear study w/ no ischemia/  normal LV function and wall motion , ef 83%  . CHOLECYSTECTOMY N/A  09/06/2017   Procedure: LAPAROSCOPIC CHOLECYSTECTOMY WITH INTRAOPERATIVE CHOLANGIOGRAM;  Surgeon: Armandina Gemma, MD;  Location: [facility];  Service: General;  Laterality: N/A;  . CYSTOSCOPY/RETROGRADE/URETEROSCOPY/STONE EXTRACTION WITH BASKET Left 03/08/2016   Procedure: CYSTOSCOPY/RETROGRADE/URETEROSCOPY/STONE EXTRACTION WITH BASKET, STENT PLACEMENT;  Surgeon: Rana Snare, MD;  Location: [facility]  Service: Urology;  Laterality: Left;  . ENDOMETRIAL BIOPSY    . ERCP N/A 09/07/2017   Procedure: ENDOSCOPIC RETROGRADE CHOLANGIOPANCREATOGRAPHY (ERCP);  Surgeon: [name], MD;  Location: [facility];  Service: Gastroenterology;  Laterality: N/A;  . ESOPHAGOGASTRODUODENOSCOPY (EGD) WITH PROPOFOL N/A 03/16/2020   Procedure: ESOPHAGOGASTRODUODENOSCOPY (EGD) WITH PROPOFOL;  Surgeon: [name], MD;  Location: [facility];  Service: Gastroenterology;  Laterality: N/A;  unable to locate ampulla, aborted ERCP, changed to EGD  . HOLMIUM LASER APPLICATION Left 12/27/6267   Procedure: HOLMIUM LASER APPLICATION;  Surgeon: Rana Snare, MD;  Location: [facility];  Service: Urology;  Laterality: Left;  . MOUTH SURGERY    . MYRINGECOTMY W/ REMOVAL MIDDLE EAR CHOLESTEATOMA TYPE 1 FASICA TYMPANOPLASTY  09/13/2000  . ROBOTIC ASSISTED TOTAL HYSTERECTOMY WITH BILATERAL SALPINGO OOPHERECTOMY  08-02-2009   at [facility]  dr Denman George   w/  Bilateral pelvic and para aortic lymph node dissection's  . THYROIDECTOMY N/A 11/19/2015   Procedure: TOTAL THYROIDECTOMY;  Surgeon: Armandina Gemma, MD;  Location: [facility];  Service: General;  Laterality: N/A;  . TONSILLECTOMY  age 12  . TRANSTHORACIC ECHOCARDIOGRAM  07/19/2014   ef 55-60%/  trivial TR  . TYMPANOPLASTY Right 1993     OB History    Gravida  0   Para      Term      Preterm      AB      Living        SAB      IAB      Ectopic      Multiple      Live Births              Family History  Problem Relation Age of Onset  . Heart  disease Sister   . Heart attack Brother   . Heart disease Brother   . Heart disease Sister   . Diabetes Sister   . Breast cancer Sister   . Asthma Mother   . Heart disease Mother   . Diabetes Mother   . Emphysema Mother   . Hypertension Mother   . Stroke Mother   . Prostate cancer Father     Social History   Tobacco Use  . Smoking status: Never Smoker  . Smokeless tobacco: Never Used  Vaping Use  . Vaping Use: Never used  Substance Use Topics  . Alcohol use: No  . Drug use: No    Home Medications Prior to Admission medications   Medication Sig Start Date End Date Taking? Authorizing Provider  naproxen (NAPROSYN) 375 MG tablet Take 1 tablet (375 mg total) by mouth 2 (two) times daily. 11/24/20  Yes Dorie Rank,  MD  ACCU-CHEK GUIDE test strip  03/23/20   [provider]  Accu-Chek Softclix Lancets lancets daily. 07/21/20   [provider]  atorvastatin (LIPITOR) 40 MG tablet Take 40 mg by mouth daily. 08/22/20   [provider]  buPROPion (WELLBUTRIN XL) 150 MG 24 hr tablet Take 150 mg by mouth every morning. 07/14/20   [provider]  buPROPion (WELLBUTRIN XL) 300 MG 24 hr tablet Take 1 tablet by mouth every morning. 10/19/20   [provider]  Cetirizine HCl (ZYRTEC ALLERGY) 10 MG CAPS Take 10 mg by mouth daily.    [provider]  gabapentin (NEURONTIN) 400 MG capsule Take 400-1,200 mg by mouth See admin instructions. Takes 400 mg in the morning 400 mg in the afternoon and 1200 mg at night    [provider]  glimepiride (AMARYL) 4 MG tablet Take 4 mg by mouth 2 (two) times daily.     [provider]  hydrOXYzine (ATARAX/VISTARIL) 25 MG tablet Take 1 tablet (25 mg total) by mouth 3 (three) times daily as needed for anxiety. 04/04/20   Connye Burkitt, NP  hyoscyamine (LEVSIN) 0.125 MG tablet Take 0.125 mg by mouth 4 (four) times daily. 09/24/20   [provider]  ibuprofen (ADVIL) 200 MG tablet Take 200 mg  by mouth every 6 (six) hours as needed for mild pain.    [provider]  JANUMET 50-1000 MG tablet Take 1 tablet by mouth 2 (two) times daily. 05/24/20   [provider]  LANTUS SOLOSTAR 100 UNIT/ML Solostar Pen Inject 30 Units into the skin 2 (two) times daily. 04/10/20   [provider]  linagliptin (TRADJENTA) 5 MG TABS tablet Take 1 tablet (5 mg total) by mouth daily with breakfast. Patient not taking: No sig reported 04/02/20   Money, Lowry Ram, FNP  liraglutide (VICTOZA) 18 MG/3ML SOPN Inject 1.8 mg into the skin daily. 04/02/20   Money, Lowry Ram, FNP  lisinopril (PRINIVIL,ZESTRIL) 5 MG tablet Take 5 mg by mouth daily.    [provider]  metFORMIN (GLUCOPHAGE) 1000 MG tablet Take 1 tablet (1,000 mg total) by mouth 2 (two) times daily with a meal. Patient not taking: No sig reported 04/01/20   Money, Lowry Ram, FNP  methocarbamol (ROBAXIN) 500 MG tablet 1 po q 8 prn Patient not taking: No sig reported 04/28/20   Magnant, Charles L, PA-C  pantoprazole (PROTONIX) 40 MG tablet Take 40 mg by mouth 2 (two) times daily.     [provider]  Peppermint Oil (IBGARD) 90 MG CPCR Take 180 mg by mouth daily. 12/08/19   [provider]  polyethylene glycol-electrolytes (NULYTELY) 420 g solution Take 4,000 mLs by mouth once. 11/13/18   [provider]  predniSONE (STERAPRED UNI-PAK 21 TAB) 5 MG (21) TBPK tablet Take dosepak as directed Patient not taking: No sig reported 04/28/20   Magnant, Charles L, PA-C  risperiDONE (RISPERDAL) 2 MG tablet Take 2 mg by mouth at bedtime.  08/22/16   [provider]  sucralfate (CARAFATE) 1 g tablet Take 1 g by mouth 3 (three) times daily. 10/28/20   [provider]  SYNTHROID 175 MCG tablet Take 175 mcg by mouth daily. 07/27/20   [provider]  traMADol (ULTRAM) 50 MG tablet Take 1 tablet (50 mg total) by mouth every 12 (twelve) hours as needed. Patient taking differently: Take 50 mg by  mouth every 12 (twelve) hours as needed for moderate pain. 06/16/20 06/16/21  Magnant,  Charles L, PA-C  traZODone (DESYREL) 50 MG tablet Take 1 tablet (50 mg total) by mouth at bedtime as needed for sleep. 04/04/20   Connye Burkitt, NP  Vitamin D, Cholecalciferol, 25 MCG (1000 UT) TABS Take 1,000 Units by mouth daily.    [provider]    Allergies    Hydrocodone, Tape, and Codeine  Review of Systems   Review of Systems  All other systems reviewed and are negative.   Physical Exam Updated Vital Signs BP 113/86   Pulse 63   Temp 98.5 F (36.9 C) (Oral)   Resp 20   Ht 1.6 m (5\' 3" )   Wt 105 kg   SpO2 97%   BMI 41.01 kg/m   Physical Exam Vitals and nursing note reviewed.  Constitutional:      General: She is not in acute distress.    Appearance: She is well-developed.  HENT:     Head: Normocephalic and atraumatic.     Right Ear: External ear normal.     Left Ear: External ear normal.  Eyes:     General: No scleral icterus.       Right eye: No discharge.        Left eye: No discharge.     Conjunctiva/sclera: Conjunctivae normal.  Neck:     Trachea: No tracheal deviation.  Cardiovascular:     Rate and Rhythm: Normal rate and regular rhythm.  Pulmonary:     Effort: Pulmonary effort is normal. No respiratory distress.     Breath sounds: Normal breath sounds. No stridor. No wheezing or rales.  Abdominal:     General: Bowel sounds are normal. There is no distension.     Palpations: Abdomen is soft.     Tenderness: There is no abdominal tenderness. There is no guarding or rebound.  Musculoskeletal:        General: No tenderness.     Cervical back: Neck supple.     Comments: Mild edema right lower extremity  Skin:    General: Skin is warm and dry.     Findings: No rash.  Neurological:     Mental Status: She is alert.     Cranial Nerves: No cranial nerve deficit (no facial droop, extraocular movements intact, no slurred speech).     Sensory: No sensory  deficit.     Motor: No abnormal muscle tone or seizure activity.     Coordination: Coordination normal.     ED Results / Procedures / Treatments   Labs (all labs ordered are listed, but only abnormal results are displayed) Labs Reviewed  COMPREHENSIVE METABOLIC PANEL - Abnormal; Notable for the following components:      Result Value   Glucose, Bld 232 (*)    Calcium 8.1 (*)    AST 51 (*)    Anion gap 4 (*)    All other components within normal limits  CBC WITH DIFFERENTIAL/PLATELET - Abnormal; Notable for the following components:   WBC 3.5 (*)    Hemoglobin 11.9 (*)    MCH 24.5 (*)    RDW 16.3 (*)    Platelets 102 (*)    All other components within normal limits  BRAIN NATRIURETIC PEPTIDE  D-DIMER, QUANTITATIVE  TROPONIN I (HIGH SENSITIVITY)  TROPONIN I (HIGH SENSITIVITY)    EKG EKG Interpretation  Date/Time:  Wednesday November 24 2020 16:25:15 EDT Ventricular Rate:  64 PR Interval:  151 QRS Duration: 95 QT Interval:  455 QTC Calculation: 470 R Axis:  75 Text Interpretation: Sinus rhythm Low voltage, precordial leads Borderline T abnormalities, anterior leads 12 Lead; [place] No significant change since last tracing Confirmed by Dorie Rank [phone]) on 11/24/2020 5:15:41 PM   Radiology DG Chest 2 View  Result Date: 11/24/2020 CLINICAL DATA:  Chest pain shortness of breath. EXAM: CHEST - 2 VIEW COMPARISON:  Chest x-ray of Nov 03, 2018. FINDINGS: Trachea midline. Cardiomediastinal contours and hilar structures are normal. Lungs are clear aside from linear opacities at the LEFT lung base. No sign of lobar consolidative changes. No visible pneumothorax. No pleural effusion. On limited assessment no acute skeletal process. IMPRESSION: Linear opacities at the LEFT lung base, likely atelectasis. Electronically Signed   By: Zetta Bills M.D.   On: 11/24/2020 17:57    Procedures Procedures   Medications Ordered in ED Medications - No data to display  ED Course  I have  reviewed the triage vital signs and the nursing notes.  Pertinent labs & imaging results that were available during my care of the patient were reviewed by me and considered in my medical decision making (see chart for details).  Clinical Course as of 11/24/20 2126  Wed Nov 24, 2020  2058 Laboratory tests show essentially normal CBC.  Metabolic panel notable for glucose of 232.  No signs of DKA or acidosis.  D-dimer is negative. [JK]  2059 We will troponins are normal. [JK]  2059 Chest x-ray shows linear opacities most likely atelectasis [JK]    Clinical Course User Index [JK] Dorie Rank, MD   MDM Rules/Calculators/A&P HEAR Score: 5                        Patient presented to the ED for evaluation of chest discomfort.  Patient does have a moderate risk heart score.  ED work-up is reassuring.  Serial troponins are normal.  EKG without acute ischemic changes.  Doubt acute coronary syndrome.  Chest x-ray does not show evidence of pneumonia or pneumothorax.  D-dimer is negative arguing against pulmonary embolism.  Patient otherwise appears well.  Stable for discharge.  Recommend outpatient follow-up with PCP Final Clinical Impression(s) / ED Diagnoses Final diagnoses:  Chest pain, unspecified type    Rx / DC Orders ED Discharge Orders         Ordered    naproxen (NAPROSYN) 375 MG tablet  2 times daily        11/24/20 2124           Dorie Rank, MD 11/24/20 2126

## 2020-11-24 NOTE — ED Triage Notes (Signed)
Patient complains of chest tightness and SOB x1 week but got worse today. Denies N/V. Stated she walked to the mailbox and back today and she had to sit down, endorses a 'little bit' of lower extremity swelling.

## 2020-11-24 NOTE — Discharge Instructions (Signed)
Take the medications as needed for pain.  Follow-up with your doctor to be rechecked and discuss further testing.  Return to the ER for worsening symptoms

## 2020-11-24 NOTE — ED Provider Notes (Signed)
Emergency Medicine Provider Triage Evaluation Note  Rhonda Higgins , a 50 y.o. female  was evaluated in triage.  Pt complains of shortness of breath x2 weeks and chest pain x1 day.  Patient states that shortness of breath has been intermittent.  Chest pain started today when she was walking to the mailbox.  She states her brother died of a heart attack at age 45.  She says she has "a little" swelling in her legs.  Review of Systems  Positive: Shortness of breath, chest pain Negative: Fever, chills, cough  Physical Exam  BP 138/65 (BP Location: Left Arm)   Pulse 71   Temp 98.5 F (36.9 C) (Oral)   Resp 18   Ht 5\' 3"  (1.6 m)   Wt 105 kg   SpO2 96%   BMI 41.01 kg/m  Gen:   Awake, no distress   Resp:  Normal effort  MSK:   Moves extremities without difficulty Other:  1+ bilateral pitting edema in BLE  Medical Decision Making  Medically screening exam initiated at 4:50 PM.  Appropriate orders placed.  Areil L Bynum Higgins was informed that the remainder of the evaluation will be completed by another provider, this initial triage assessment does not replace that evaluation, and the importance of remaining in the ED until their evaluation is complete.  Cardiac work up initiated. BNP ordered.   Portions of this note were generated with [employer]. Dictation errors may occur despite best attempts at proofreading.    Barrie Folk, PA-C 11/24/20 1650    Dorie Rank, MD 11/25/20 [phone]

## 2020-11-26 ENCOUNTER — Ambulatory Visit: Payer: Medicaid Other | Admitting: Surgical

## 2020-12-03 ENCOUNTER — Ambulatory Visit (INDEPENDENT_AMBULATORY_CARE_PROVIDER_SITE_OTHER): Payer: Medicaid Other | Admitting: Orthopedic Surgery

## 2020-12-03 ENCOUNTER — Ambulatory Visit (INDEPENDENT_AMBULATORY_CARE_PROVIDER_SITE_OTHER): Payer: Medicaid Other

## 2020-12-03 ENCOUNTER — Encounter: Payer: Self-pay | Admitting: Orthopedic Surgery

## 2020-12-03 DIAGNOSIS — M25561 Pain in right knee: Secondary | ICD-10-CM

## 2020-12-03 DIAGNOSIS — M659 Synovitis and tenosynovitis, unspecified: Secondary | ICD-10-CM

## 2020-12-03 MED ORDER — BUPIVACAINE HCL 0.25 % IJ SOLN
4.0000 mL | INTRAMUSCULAR | Status: AC | PRN
  Administered 2020-12-03: 4 mL via INTRA_ARTICULAR

## 2020-12-03 MED ORDER — METHYLPREDNISOLONE ACETATE 40 MG/ML IJ SUSP
40.0000 mg | INTRAMUSCULAR | Status: AC | PRN
Start: 2020-12-03 — End: 2020-12-03
  Administered 2020-12-03: 40 mg via INTRA_ARTICULAR

## 2020-12-03 MED ORDER — LIDOCAINE HCL 1 % IJ SOLN
5.0000 mL | INTRAMUSCULAR | Status: AC | PRN
  Administered 2020-12-03: 5 mL

## 2020-12-03 NOTE — Progress Notes (Signed)
Office Visit Note   Patient: Rhonda Higgins           Date of Birth: [birth year]           MRN: [id number] Visit Date: 12/03/2020 Requested by: Rhonda Cowman, MD [address] [place],  [place] [address] PCP: Rhonda Cowman, MD  Subjective: Chief Complaint  Patient presents with   Left Wrist - Pain   Right Knee - Pain    HPI: Rhonda Higgins is a 50 year old right-hand-dominant patient with left wrist pain for 2 months.  Denies any history of injury.  Pain radiates into the small and long finger but no numbness and tingling.  Has some finger stiffness.  Denies any neck pain or radicular symptoms.  She has been using a splint from the emergency department which has helped.  Patient also reports right knee pain.  Reports pretty significant lateral sided pain and at times she is unable to walk.  Takes over-the-counter medication but not very frequently for these orthopedic problems.  The pain in the right knee does wake her up from sleep at night.  This happens 1-2 nights a week.  Mild difficulty going up and down stairs.              ROS: All systems reviewed are negative as they relate to the chief complaint within the history of present illness.  Patient denies  fevers or chills.   Assessment & Plan: Visit Diagnoses:  1. Right knee pain, unspecified chronicity     Plan: Impression is left wrist pain with unclear etiology.  Radiographs unremarkable.  Flexor and extensor tendons are intact in the left wrist region.  No paresthesias on the dorsal or palmar aspect of the hand.  I think this is something we can wait on for now in terms of continuing splint immobilization with activity as tolerated.  I think injection and/or MRI scanning on that left wrist would be the next that.  Could be TFCC problem versus tendinitis.  No history of trauma.  Alternatively some type of atypical ulnar nerve compression is a consideration as well.  Regarding the right knee her radiographs look good.  No  effusion.  Injections are reasonable for step for this but if that does not help MRI scanning to evaluate for lateral sided pathology is indicated.  Follow-Up Instructions: Return if symptoms worsen or fail to improve.   Orders:  Orders Placed This Encounter  Procedures   XR Knee 1-2 Views Right   No orders of the defined types were placed in this encounter.     Procedures: Large Joint Inj: R knee on 12/03/2020 11:19 PM Indications: diagnostic evaluation, joint swelling and pain Details: 18 G 1.5 in needle, superolateral approach  Arthrogram: No  Medications: 5 mL lidocaine 1 %; 40 mg methylPREDNISolone acetate 40 MG/ML; 4 mL bupivacaine 0.25 % Outcome: tolerated well, no immediate complications Procedure, treatment alternatives, risks and benefits explained, specific risks discussed. Consent was given by the patient. Immediately prior to procedure a time out was called to verify the correct patient, procedure, equipment, support staff and site/side marked as required. Patient was prepped and draped in the usual sterile fashion.      Clinical Data: No additional findings.  Objective: Vital Signs: There were no vitals taken for this visit.  Physical Exam:   Constitutional: Patient appears well-developed HEENT:  Head: Normocephalic Eyes:EOM are normal Neck: Normal range of motion Cardiovascular: Normal rate Pulmonary/chest: Effort normal Neurologic: Patient is alert Skin: Skin is  warm Psychiatric: Patient has normal mood and affect   Ortho Exam: Ortho exam demonstrates full active and passive range of motion of the left wrist.  Symmetric grip strength.  No snuffbox tenderness.  No tenderness over the first dorsal compartment.  Composite finger flexion and extension intact in the left hand.  Radial pulse intact.  No discrete TFCC tenderness.  Pronation supination is full.  Specialty Comments:  No specialty comments available.  Imaging: XR Knee 1-2 Views  Right  Result Date: 12/03/2020 AP lateral radiographs right knee reviewed.  No arthritis.  No fracture.  No spurring.  Minimal joint space narrowing in the medial and lateral compartments.  Alignment intact.    PMFS History: Patient Active Problem List   Diagnosis Date Noted   Elevated levels of transaminase & lactic acid dehydrogenase 08/03/2020   Epigastric pain 08/03/2020   Liver nodule 08/03/2020   Obstruction of bile duct 08/03/2020   Cirrhosis of liver ([place]) 07/29/2020   Gastroparesis 07/29/2020   Irritable bowel syndrome 07/29/2020   Steatosis of liver 07/29/2020   MDD (major depressive disorder), recurrent episode, severe ([place]) 04/01/2020   Borderline personality disorder ([place]) 04/01/2020   Intermittent explosive disorder in adult 04/01/2020   Cholelithiasis with chronic cholecystitis 09/06/2017   Multinodular goiter (nontoxic) 11/17/2015   Diabetes mellitus without complication ([place]) 29/79/8921   GERD (gastroesophageal reflux disease) 07/19/2014   HLD (hyperlipidemia) 02/09/2010   Obstructive sleep apnea 02/09/2010   Allergic rhinitis 02/09/2010   History of uterine cancer 02/09/2010   Past Medical History:  Diagnosis Date   Adenocarcinoma ([place])    endometrial, FIGO GRADE 1   Allergic rhinitis    Atypical chest pain    History of   Depression    Elevated liver enzymes    GERD (gastroesophageal reflux disease)    Hematuria    History of endometrial cancer 08-02-2009   oncologist-  dr brewster/ [place] George and dr kinard/  no recurrence   endometrial adenocarinoma Stage 1B, Grade 1, FIGO--  s/p  TAH w/ BSO and pelvic lymph node dissection's and radiation therapy   History of kidney stones    History of radiation therapy    2011  pelvic intracavity brachytherapy treatment's for endometrial carcinoma   History of thyroid nodule    multinodular goiter s/p  total thyroidectomy 11-19-2015  per pathology -  adenomatoid nodules   Hyperlipidemia    Hypothyroidism, postsurgical     Insulin dependent diabetes mellitus    Type 2   Left ureteral stone    Obesity    OSA (obstructive sleep apnea)    severe OSA  per study 03-08-2010--  noncomplant cpap (no more sleep apnes since lost weight 2019)   Overactive bladder    Personality disorder (HCC)    Polyphagia(783.6)    PONV (postoperative nausea and vomiting)    after ear surgery only one time   Right lower quadrant pain    Urgency of urination    UTI (urinary tract infection)     Family History  Problem Relation Age of Onset   Heart disease Sister    Heart attack Brother    Heart disease Brother    Heart disease Sister    Diabetes Sister    Breast cancer Sister    Asthma Mother    Heart disease Mother    Diabetes Mother    Emphysema Mother    Hypertension Mother    Stroke Mother    Prostate cancer Father     Past Surgical  History:  Procedure Laterality Date   CARDIOVASCULAR STRESS TEST  06/09/2008   normal nuclear study w/ no ischemia/  normal LV function and wall motion , ef 83%   CHOLECYSTECTOMY N/A 09/06/2017   Procedure: LAPAROSCOPIC CHOLECYSTECTOMY WITH INTRAOPERATIVE CHOLANGIOGRAM;  Surgeon: Armandina Gemma, MD;  Location: [facility];  Service: General;  Laterality: N/A;   CYSTOSCOPY/RETROGRADE/URETEROSCOPY/STONE EXTRACTION WITH BASKET Left 03/08/2016   Procedure: CYSTOSCOPY/RETROGRADE/URETEROSCOPY/STONE EXTRACTION WITH BASKET, STENT PLACEMENT;  Surgeon: Rana Snare, MD;  Location: [facility];  Service: Urology;  Laterality: Left;   ENDOMETRIAL BIOPSY     ERCP N/A 09/07/2017   Procedure: ENDOSCOPIC RETROGRADE CHOLANGIOPANCREATOGRAPHY (ERCP);  Surgeon: [name], MD;  Location: [facility];  Service: Gastroenterology;  Laterality: N/A;   ESOPHAGOGASTRODUODENOSCOPY (EGD) WITH PROPOFOL N/A 03/16/2020   Procedure: ESOPHAGOGASTRODUODENOSCOPY (EGD) WITH PROPOFOL;  Surgeon: [name], MD;  Location: [facility];  Service: Gastroenterology;  Laterality: N/A;  unable to locate ampulla, aborted  ERCP, changed to EGD   HOLMIUM LASER APPLICATION Left 1/61/0960   Procedure: HOLMIUM LASER APPLICATION;  Surgeon: Rana Snare, MD;  Location: [facility];  Service: Urology;  Laterality: Left;   MOUTH SURGERY     MYRINGECOTMY W/ REMOVAL MIDDLE EAR CHOLESTEATOMA TYPE 1 FASICA TYMPANOPLASTY  09/13/2000   ROBOTIC ASSISTED TOTAL HYSTERECTOMY WITH BILATERAL SALPINGO OOPHERECTOMY  08-02-2009   at [facility]  dr Denman George   w/  Bilateral pelvic and para aortic lymph node dissection's   THYROIDECTOMY N/A 11/19/2015   Procedure: TOTAL THYROIDECTOMY;  Surgeon: Armandina Gemma, MD;  Location: [facility];  Service: General;  Laterality: N/A;   TONSILLECTOMY  age 39   TRANSTHORACIC ECHOCARDIOGRAM  07/19/2014   ef 55-60%/  trivial TR   TYMPANOPLASTY Right 1993   Social History   Occupational History   Occupation: n/a  Tobacco Use   Smoking status: Never   Smokeless tobacco: Never  Vaping Use   Vaping Use: Never used  Substance and Sexual Activity   Alcohol use: No   Drug use: No   Sexual activity: Yes    Birth control/protection: None

## 2021-02-15 ENCOUNTER — Ambulatory Visit: Payer: Medicaid Other | Admitting: Podiatry

## 2021-02-15 ENCOUNTER — Other Ambulatory Visit: Payer: Self-pay

## 2021-02-15 ENCOUNTER — Other Ambulatory Visit: Payer: Self-pay | Admitting: Podiatry

## 2021-02-15 ENCOUNTER — Encounter: Payer: Self-pay | Admitting: Podiatry

## 2021-02-15 DIAGNOSIS — M79674 Pain in right toe(s): Secondary | ICD-10-CM

## 2021-02-15 DIAGNOSIS — B351 Tinea unguium: Secondary | ICD-10-CM

## 2021-02-15 DIAGNOSIS — M79675 Pain in left toe(s): Secondary | ICD-10-CM

## 2021-02-15 MED ORDER — EFINACONAZOLE 10 % EX SOLN: CUTANEOUS | 11 refills | Status: DC

## 2021-02-15 NOTE — Patient Instructions (Signed)
Onychomycosis/Fungal Toenails  WHAT IS IT? An infection that lies within the keratin of your nail plate that is caused by a fungus.  WHY ME? Fungal infections affect all ages, sexes, races, and creeds.  There may be many factors that predispose you to a fungal infection such as age, coexisting medical conditions such as diabetes, or an autoimmune disease; stress, medications, fatigue, genetics, etc.  Bottom line: fungus thrives in a warm, moist environment and your shoes offer such a location.  IS IT CONTAGIOUS? Theoretically, yes.  You do not want to share shoes, nail clippers or files with someone who has fungal toenails.  Walking around barefoot in the same room or sleeping in the same bed is unlikely to transfer the organism.  It is important to realize, however, that fungus can spread easily from one nail to the next on the same foot.  HOW DO WE TREAT THIS?  There are several ways to treat this condition.  Treatment may depend on many factors such as age, medications, pregnancy, liver and kidney conditions, etc.  It is best to ask your doctor which options are available to you.  No treatment.   Unlike many other medical concerns, you can live with this condition.  However for many people this can be a painful condition and may lead to ingrown toenails or a bacterial infection.  It is recommended that you keep the nails cut short to help reduce the amount of fungal nail. Topical treatment.  These range from herbal remedies to prescription strength nail lacquers.  About 40-50% effective, topicals require twice daily application for approximately 9 to 12 months or until an entirely new nail has grown out.  The most effective topicals are medical grade medications available through physicians offices. Oral antifungal medications.  With an 80-90% cure rate, the most common oral medication requires 3 to 4 months of therapy and stays in your system for a year as the new nail grows out.  Oral antifungal  medications do require blood work to make sure it is a safe drug for you.  A liver function panel will be performed prior to starting the medication and after the first month of treatment.  It is important to have the blood work performed to avoid any harmful side effects.  In general, this medication safe but blood work is required. Laser Therapy.  This treatment is performed by applying a specialized laser to the affected nail plate.  This therapy is noninvasive, fast, and non-painful.  It is not covered by insurance and is therefore, out of pocket.  The results have been very good with a 80-95% cure rate.  The [facility] is the only practice in the area to offer this therapy. Permanent Nail Avulsion.  Removing the entire nail so that a new nail will not grow back. 

## 2021-02-15 NOTE — Progress Notes (Signed)
  Subjective:  Patient ID: Rhonda Higgins, female    DOB: May 20, 1971,  MRN: [id number]  50 y.o. female presents with at risk foot care with history of diabetic neuropathy and painful thick toenails that are difficult to trim. Pain interferes with ambulation. Aggravating factors include wearing enclosed shoe gear. Pain is relieved with periodic professional debridement..    Patient's blood sugar was 84 mg/dl or 200-something mg/dl. States she doesn't remember.   PCP: [name], MD and last visit was: 11/17/2020  Review of Systems: Negative except as noted in the HPI.   Allergies  Allergen Reactions   Hydrocodone Shortness Of Breath and Itching   Tape Rash    Paper tape is ok   Codeine Itching    Objective:  There were no vitals filed for this visit. Constitutional Patient is a pleasant 50 y.o. Caucasian female obese in NAD. AAO x 3.  Vascular Capillary fill time to digits <3 seconds b/l lower extremities. Palpable DP/PT pulses b/l.  Pedal hair absent. Lower extremity skin temperature gradient within normal limits. No pain with calf compression b/l. No edema noted b/l lower extremities. No cyanosis or clubbing noted.  Neurologic Normal speech. Patient has subjective symptoms of neuropathy. Protective sensation intact 5/5 intact bilaterally with 10g monofilament b/l. Vibratory sensation intact b/l.  Dermatologic Pedal skin is thin shiny, atrophic b/l lower extremities. No open wounds b/l lower extremities. No interdigital macerations b/l lower extremities. Toenails 1-5 b/l elongated, discolored, dystrophic, thickened, crumbly with subungual debris and tenderness to dorsal palpation. Incurvated nailplate L hallux and R hallux with tenderness to palpation. No erythema, no edema, no drainage noted. No fluctuance.   Orthopedic: Normal muscle strength 5/5 to all lower extremity muscle groups bilaterally. Patient ambulates independent of any assistive aids.   Hemoglobin A1C Latest Ref  Rng & Units 04/03/2020  HGBA1C 4.8 - 5.6 % 7.4(H)  Some recent data might be hidden   Assessment:   1. Pain due to onychomycosis of toenails of both feet    Plan:  Patient was evaluated and treated and all questions answered. Consent given for treatment as described below: -Examined patient. -Continue diabetic foot care principles: inspect feet daily, monitor glucose as recommended by PCP and/or Endocrinologist, and follow prescribed diet per PCP, Endocrinologist and/or dietician. -Patient to continue soft, supportive shoe gear daily. -Toenails 1-5 b/l were debrided in length and girth with sterile nail nippers and dremel without iatrogenic bleeding.  -Offending nail border debrided and curretaged L hallux and R hallux utilizing sterile nail nipper and currette. Border(s) cleansed with alcohol and triple antibiotic ointment applied. Patient instructed to apply OTC antibiotic ointment  to L hallux and R hallux once daily for 7 days. We did discuss permanent partial nail avulsion of both great toes. She will think about it. -Patient to report any pedal injuries to medical professional immediately. -Patient/POA to call should there be question/concern in the interim.  Return in about 3 months (around 05/18/2021).  Marzetta Board, DPM

## 2021-02-15 NOTE — Telephone Encounter (Signed)
Please advise 

## 2021-02-22 ENCOUNTER — Other Ambulatory Visit (HOSPITAL_COMMUNITY): Payer: Self-pay | Admitting: Family Medicine

## 2021-02-22 DIAGNOSIS — R911 Solitary pulmonary nodule: Secondary | ICD-10-CM

## 2021-03-04 ENCOUNTER — Ambulatory Visit (HOSPITAL_COMMUNITY)
Admission: RE | Admit: 2021-03-04 | Discharge: 2021-03-04 | Disposition: A | Discharge status: Home / Self Care | Payer: Medicaid Other | Source: Ambulatory Visit | Attending: Family Medicine | Admitting: Family Medicine

## 2021-03-04 ENCOUNTER — Other Ambulatory Visit: Payer: Self-pay

## 2021-03-04 DIAGNOSIS — R911 Solitary pulmonary nodule: Secondary | ICD-10-CM | POA: Diagnosis not present

## 2021-03-04 LAB — GLUCOSE, CAPILLARY: Glucose-Capillary: 119 mg/dL — ABNORMAL HIGH (ref 70–99)

## 2021-03-04 IMAGING — CT NM PET TUM IMG INITIAL (PI) SKULL BASE T - THIGH
9 series · 25 of 25 positions shown · non-contrast
Comparison: Chest radiograph [DATE] and MRI abdomen from
[DATE].

CLINICAL DATA: Initial treatment strategy for lung nodule. History
of uterine cancer.

EXAM:
NUCLEAR MEDICINE PET SKULL BASE TO THIGH
TECHNIQUE: 11.5 mCi F-18 FDG was injected intravenously. Full-ring PET imaging
was performed from the skull base to thigh after the radiotracer. CT
data was obtained and used for attenuation correction and anatomic
localization.
Fasting blood glucose: 117 mg/dl

[Series 3: pet sk_thigh ac · axial · 5.0mm · 4.07mm/px · z∈[-502,+410]mm · 4 of 229 slices shown]
[im 1/229]
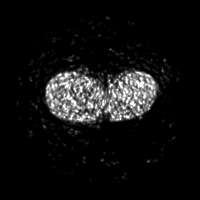
[im 77/229]
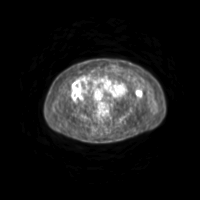
[im 153/229]
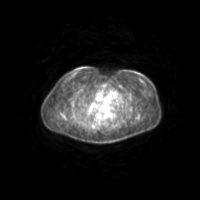
[im 229/229]
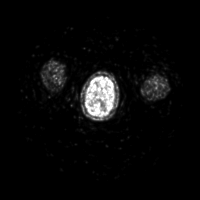

[Series 4: ct sk_thigh 5.0 hd_fov · axial · 5.0mm · 1.17mm/px · z∈[-502,+410]mm · 5 of 229 slices shown]
[im 1/229]
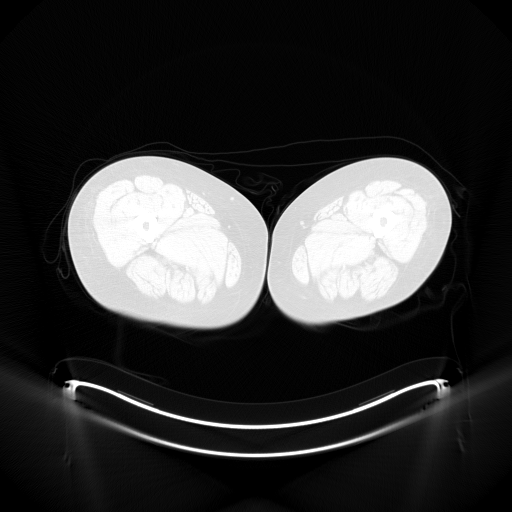
[im 58/229]
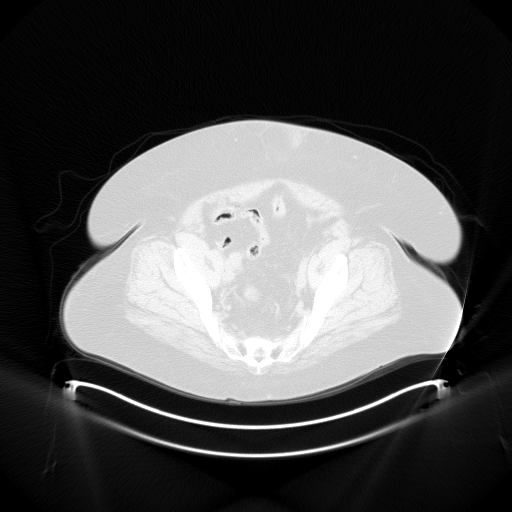
[im 115/229]
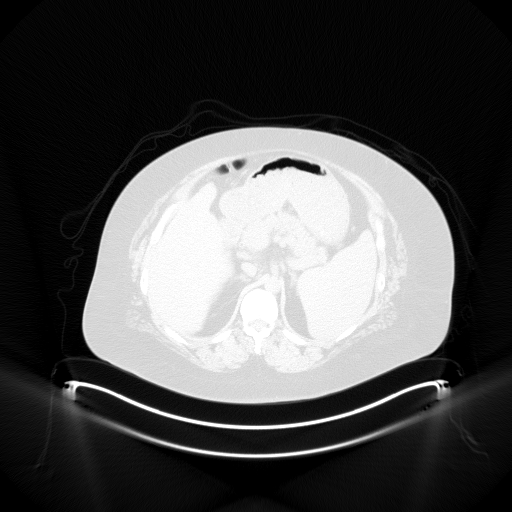
[im 172/229]
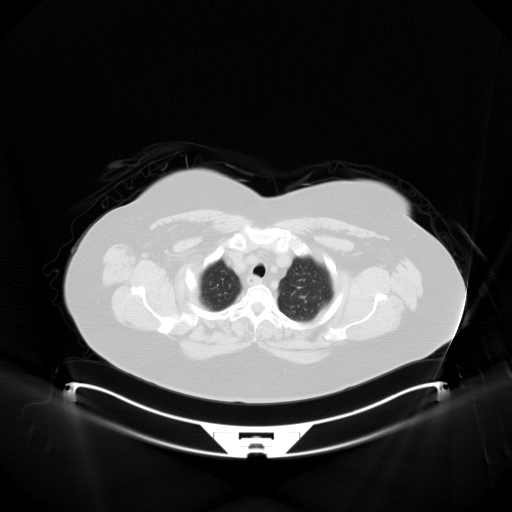
[im 229/229]
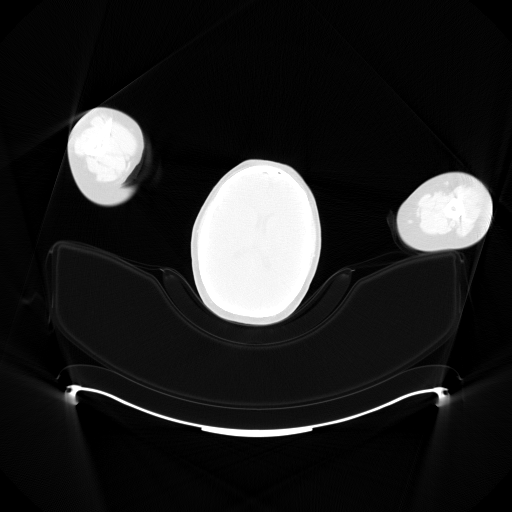

[Series 5: pet sk_thigh nac · axial · 5.0mm · 4.07mm/px · z∈[-502,+410]mm · 5 of 229 slices shown]
[im 1/229]
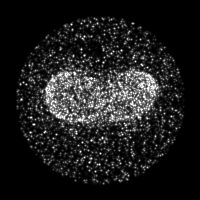
[im 58/229]
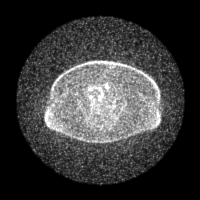
[im 115/229]
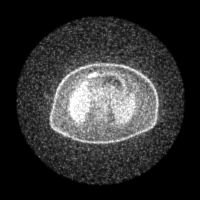
[im 172/229]
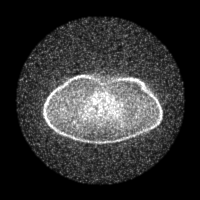
[im 229/229]
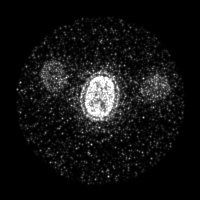

[Series 8: ct sk_thigh 5.0 br59 (id)_bone · axial · 5.0mm · 0.68mm/px · z∈[-28,+252]mm · 2 of 71 slices shown]
[im 1/71  brain]
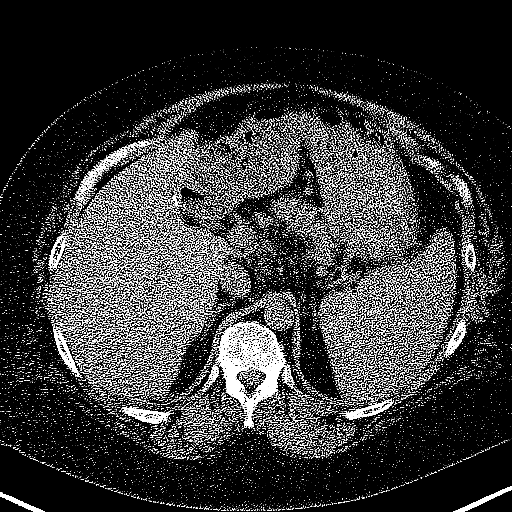
[im 71/71]
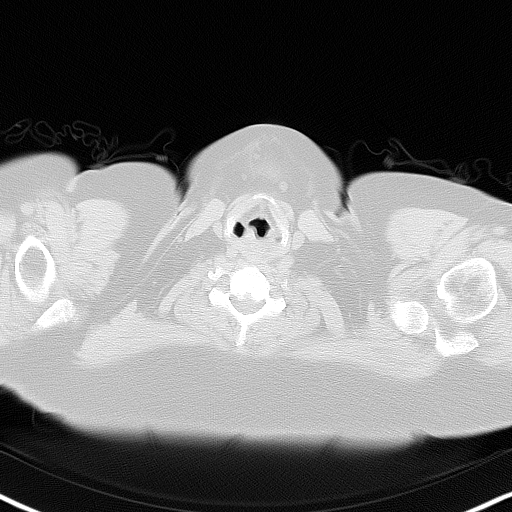

[Series 603: fused cor · 1 of 58 slices shown]
[im 1/58]
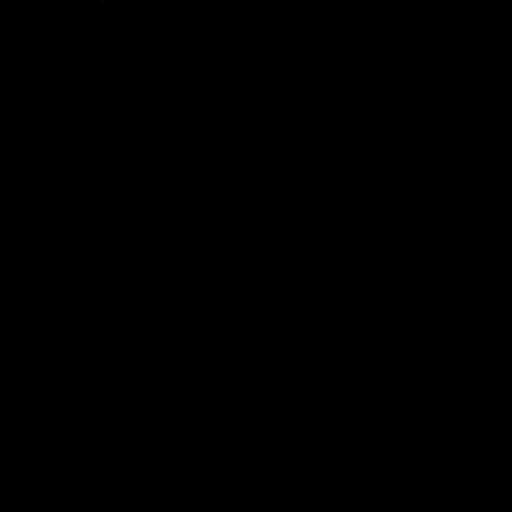

[Series 604: <mip collection> · coronal · 1.89mm/px · 1 of 32 slices shown]
[im 1/32]
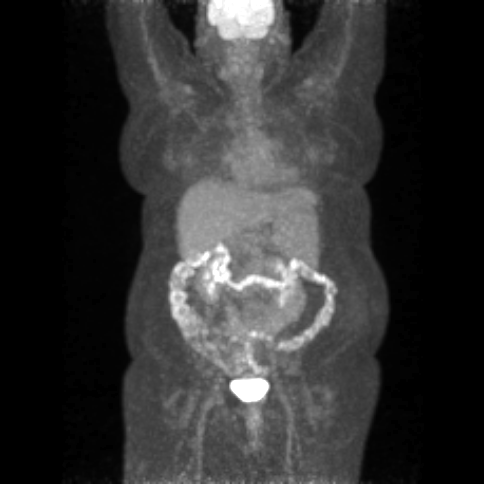

[Series 605: range-ct sk_thigh 5.0 hd_fov-tra-<alpha range> · 5 of 223 slices shown]
[im 1/223]
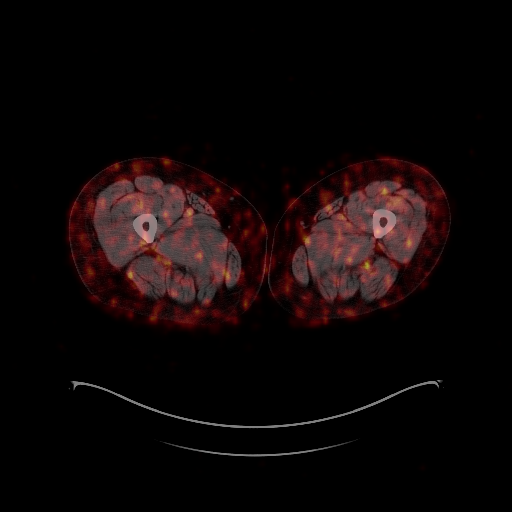
[im 56/223]
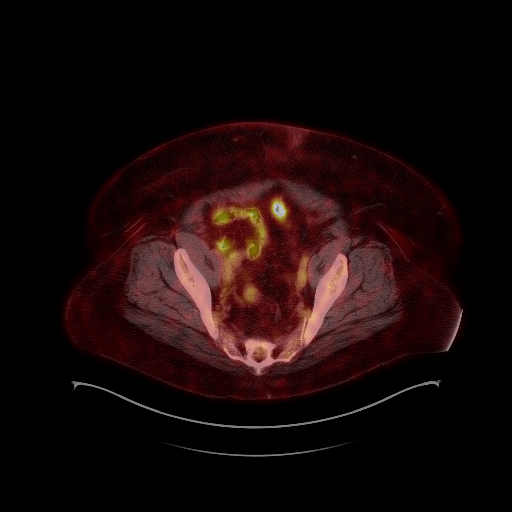
[im 112/223]
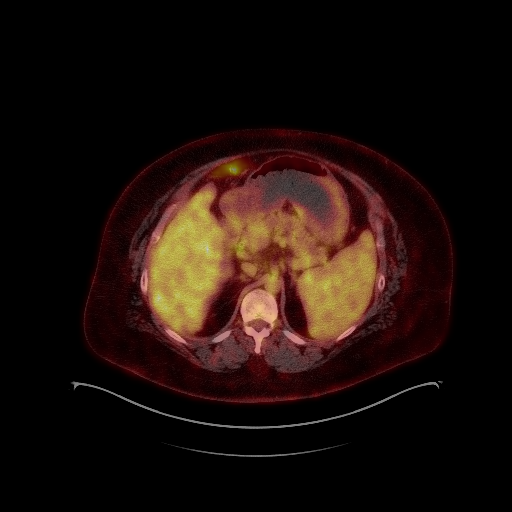
[im 167/223]
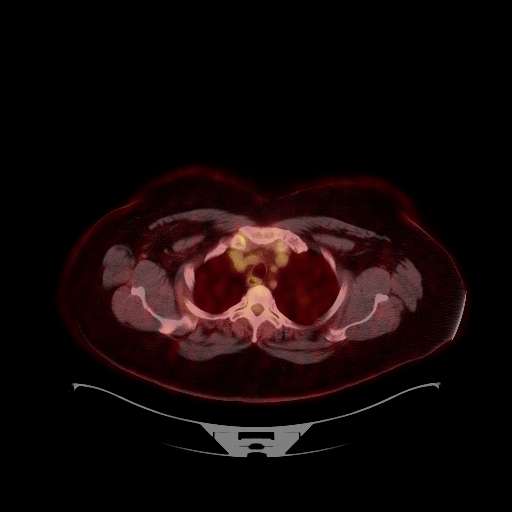
[im 223/223]
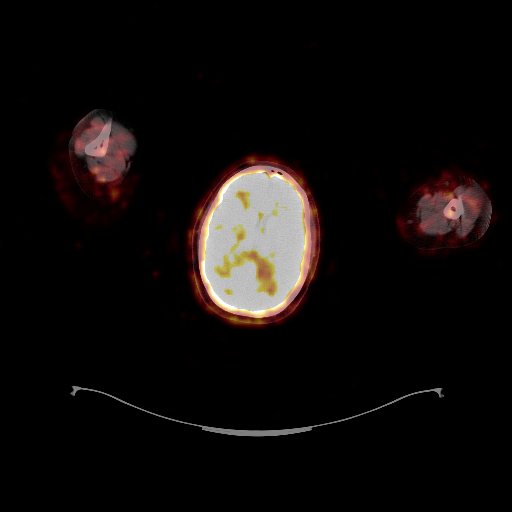

[Series 1097: results mm oncology reading · 5.0mm · 0.79mm/px · 1 of 4 slices shown (1 of 2)]
[im 1/4]
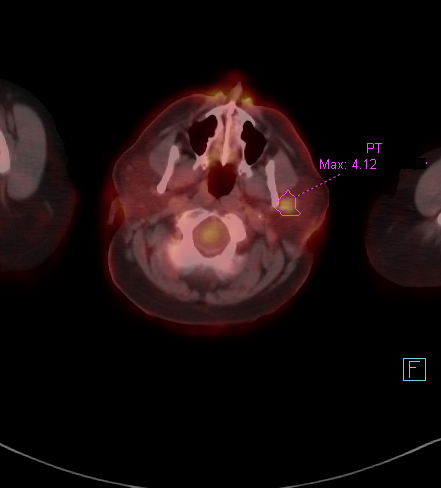

[Series 1117: results mm oncology reading · 5.0mm · 0.86mm/px · 1 of 2 slices shown (2 of 2)]
[im 1/2]
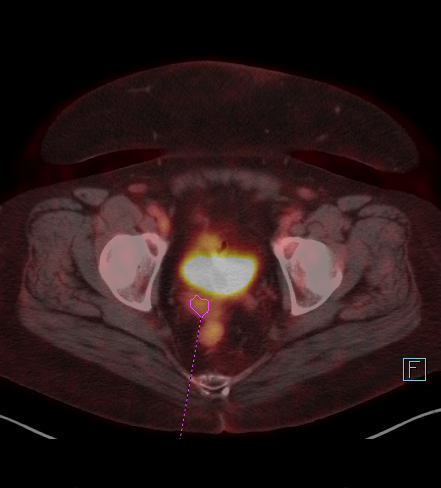

[25 of 25 positions shown; findings below may reference images not displayed]

FINDINGS: Mediastinal blood pool activity: SUV max

Liver activity: SUV max NA

NECK: 0.9 by 1.4 cm left parotid nodule or lymph node on image 20 of
series 4 with maximum SUV 4.1.

Incidental CT findings: Thyroidectomy.

CHEST: [DATE] by 1.3 cm sharply defined and around left lower lobe
nodule corresponding to the finding on MRI, image 40 series 8,
maximum SUV 3.9. Vague nodularity in this vicinity on [DATE] was
only 0.6 cm in diameter.

No hypermetabolic adenopathy.

Incidental CT findings: Mild cardiomegaly. Mild bibasilar
atelectasis or scarring.

ABDOMEN/PELVIS: Accentuated activity in bowel specially the colon,
likely physiologic.

There is fluid density along the vaginal cuff and slightly tracking
along the right adnexa, increased from [DATE]. Equivocal
accentuated activity along the right side of the vaginal cuff and
this adjacent fluid collection, maximum SUV about 4.1.

Incidental CT findings: Hepatic cirrhosis with mild splenomegaly.
Clustered left mid kidney renal calculi to 0.8 cm in long axis on
image 132 series 4. Umbilical hernia contains adipose tissue.

SKELETON: No significant abnormal hypermetabolic activity in this
region.

Incidental CT findings: none
IMPRESSION: 1. Slow growing rounded left lower lobe nodule has low-grade
metabolic activity with a maximum SUV of 4.1, which is mildly above
blood pool. Appearance suspicious for bronchial carcinoid
tumor/low-grade lung malignancy. Tissue diagnosis is likely
warranted.
2. Small but increased amount of fluid density signal adjacent to
the vaginal cuff with some faint accentuated metabolic activity
along the right side of the vaginal cuff. This is probably
incidental, but given the patient's history of uterine cancer would
suggest pelvic sonography with careful evaluation of the vaginal
cuff for any nodularity or masslike appearance.
3. 0.9 by 1.4 cm left parotid nodule or lymph node, mildly
hypermetabolic with maximum SUV 4.1. Given the patient's lack of
smoking history, possibility of worth in tumor is reduced. While
statistically most likely to be benign, this small lesion may
warrant further characterization such as sonography and fine-needle
aspiration.
4. Other imaging findings of potential clinical significance: Mild
cardiomegaly. Hepatic cirrhosis and mild splenomegaly.
Nonobstructive left nephrolithiasis.

## 2021-03-04 MED ORDER — FLUDEOXYGLUCOSE F - 18 (FDG) INJECTION
12.0000 | Freq: Once | INTRAVENOUS | Status: AC | PRN
  Administered 2021-03-04: 11.53 via INTRAVENOUS

## 2021-03-17 ENCOUNTER — Encounter: Payer: Self-pay | Admitting: Emergency Medicine

## 2021-03-17 ENCOUNTER — Other Ambulatory Visit: Payer: Self-pay

## 2021-03-17 ENCOUNTER — Ambulatory Visit (INDEPENDENT_AMBULATORY_CARE_PROVIDER_SITE_OTHER): Payer: Medicaid Other | Admitting: Emergency Medicine

## 2021-03-17 ENCOUNTER — Telehealth: Payer: Self-pay | Admitting: Emergency Medicine

## 2021-03-17 VITALS — BP 136/80 | HR 89 | Temp 97.5°F | Ht 63.0 in | Wt 236.0 lb

## 2021-03-17 DIAGNOSIS — R911 Solitary pulmonary nodule: Secondary | ICD-10-CM | POA: Diagnosis not present

## 2021-03-17 NOTE — Progress Notes (Signed)
[place]  Subjective:    Patient ID: Rhonda Higgins, female    DOB: June 22, 1970, 50 y.o.   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 Has IBS and gastroparesis with a lot of GI issues, has had a lot of GI imaging.   She was found to have a 1.2 left lower lobe perihilar rounded pulmonary nodule on MRI of the abdomen done 09/30/2020.  Not seen on chest x-ray from 11/24/2020.  Prompted PET scan done 03/04/2021 as below.  PET scan performed 03/04/2021 and reviewed by me shows a 1.5 x 1.3 rounded left lower lobe pulmonary nodule.  There is only low-grade metabolic activity (SUV 4.1).  No mediastinal adenopathy or any evidence of distant disease.  Slightly increased in size based on comparison with a CT abdomen 10/16/2018   Review of Systems As per HPI  Past Medical History:  Diagnosis Date   Adenocarcinoma ([place])    endometrial, FIGO GRADE 1   Allergic rhinitis    Atypical chest pain    History of   Depression    Elevated liver enzymes    GERD (gastroesophageal reflux disease)    Hematuria    History of endometrial cancer 08-02-2009   oncologist-  dr brewster/ [facility] and dr kinard/  no recurrence   endometrial adenocarinoma Stage 1B, Grade 1, FIGO--  s/p  TAH w/ BSO and pelvic lymph node dissection's and radiation therapy   History of kidney stones    History of radiation therapy    2011  pelvic intracavity brachytherapy treatment's for endometrial carcinoma   History of thyroid nodule    multinodular goiter s/p  total thyroidectomy 11-19-2015  per pathology -  adenomatoid nodules   Hyperlipidemia    Hypothyroidism, postsurgical    Insulin dependent diabetes mellitus    Type 2   Left ureteral stone    Obesity    OSA (obstructive sleep apnea)    severe OSA  per study 03-08-2010--  noncomplant cpap (no more sleep apnes since lost weight 2019)   Overactive bladder    Personality disorder  (HCC)    Polyphagia(783.6)    PONV (postoperative nausea and vomiting)    after ear surgery only one time   Right lower quadrant pain    Urgency of urination    UTI (urinary tract infection)      Family History  Problem Relation Age of Onset   Heart disease Sister    Heart attack Brother    Heart disease Brother    Heart disease Sister    Diabetes Sister    Breast cancer Sister    Asthma Mother    Heart disease Mother    Diabetes Mother    Emphysema Mother    Hypertension Mother    Stroke Mother    Prostate cancer Father     No hx lung CA in the family.   Social History   Socioeconomic History   Marital status: Married    Spouse name: Not on file   Number of children: N   Years of education: 10   Highest education level: 10th grade  Occupational History   Occupation: n/a  Tobacco Use   Smoking status: Never   Smokeless tobacco: Never  Vaping Use   Vaping Use: Never used  Substance and Sexual Activity   Alcohol use: No   Drug use: No   Sexual activity: Yes    Birth control/protection: None  Other  Topics Concern   Not on file  Social History Narrative   Lives with partner, [name].   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     atorvastatin (LIPITOR) 40 MG tablet Take 40 mg by mouth daily.     buPROPion (WELLBUTRIN XL) 150 MG 24 hr tablet Take 150 mg by mouth every morning.     buPROPion (WELLBUTRIN  XL) 300 MG 24 hr tablet Take 1 tablet by mouth every morning.     Cetirizine HCl (ZYRTEC ALLERGY) 10 MG CAPS Take 10 mg by mouth daily.     gabapentin (NEURONTIN) 400 MG capsule Take 400-1,200 mg by mouth See admin instructions. Takes 400 mg in the morning 400 mg in the afternoon and 1200 mg at night     glimepiride (AMARYL) 4 MG tablet Take 4 mg by mouth 2 (two) times daily.   5   hydrOXYzine (ATARAX/VISTARIL) 25 MG tablet Take 1 tablet (25 mg total) by mouth 3 (three) times daily as needed for anxiety. 90 tablet 0   hyoscyamine (LEVSIN) 0.125 MG tablet Take 0.125 mg by mouth 4 (four) times daily.     ibuprofen (ADVIL) 200 MG tablet Take 200 mg by mouth every 6 (six) hours as needed for mild pain.     JANUMET 50-1000 MG tablet Take 1 tablet by mouth 2 (two) times daily.     JUBLIA 10 % SOLN APPLY ONE COAT TO EACH TOENAIL ONCE DAILY FOR 48 WEEKS. 8 mL 11   LANTUS SOLOSTAR 100 UNIT/ML Solostar Pen Inject 30 Units into the skin 2 (two) times daily.     linagliptin (TRADJENTA) 5 MG TABS tablet Take 1 tablet (5 mg total) by mouth daily with breakfast. 30 tablet    liraglutide (VICTOZA) 18 MG/3ML SOPN Inject 1.8 mg into the skin daily.     lisinopril (PRINIVIL,ZESTRIL) 5 MG tablet Take 5 mg by mouth daily.     metFORMIN (GLUCOPHAGE) 1000 MG tablet Take 1 tablet (1,000 mg total) by mouth 2 (two) times daily with a meal.     methocarbamol (ROBAXIN) 500 MG tablet 1 po q 8 prn 30 tablet 0   naproxen (NAPROSYN) 375 MG tablet Take 1 tablet (375 mg total) by mouth 2 (two) times daily. 20 tablet 0   pantoprazole (PROTONIX) 40 MG tablet Take 40 mg by mouth 2 (two) times daily.   0   Peppermint Oil (IBGARD) 90 MG CPCR Take 180 mg by mouth daily.     polyethylene glycol-electrolytes (NULYTELY) 420 g solution Take 4,000 mLs by mouth once.     predniSONE (STERAPRED UNI-PAK 21 TAB) 5 MG (21) TBPK tablet Take dosepak as directed 21 tablet 0   risperiDONE (RISPERDAL) 2 MG tablet Take 2 mg by mouth at bedtime.   2    sucralfate (CARAFATE) 1 g tablet Take 1 g by mouth 3 (  three) times daily.     SYNTHROID 175 MCG tablet Take 175 mcg by mouth daily.     traMADol (ULTRAM) 50 MG tablet Take 1 tablet (50 mg total) by mouth every 12 (twelve) hours as needed. (Patient taking differently: Take 50 mg by mouth every 12 (twelve) hours as needed for moderate pain.) 30 tablet 0   traZODone (DESYREL) 50 MG tablet Take 1 tablet (50 mg total) by mouth at bedtime as needed for sleep. 30 tablet 0   Vitamin D, Cholecalciferol, 25 MCG (1000 UT) TABS Take 1,000 Units by mouth daily.     No facility-administered medications prior to visit.         Objective:   Physical Exam  Vitals:   03/17/21 1458  BP: 136/80  Pulse: 89  Temp: (!) 97.5 F (36.4 C)  TempSrc: Oral  SpO2: 97%  Weight: 236 lb (107 kg)  Height: 5\' 3"  (1.6 m)   Gen: Pleasant, overwt woman, in no distress,  normal affect  ENT: No lesions,  mouth clear,  oropharynx clear, no postnasal drip  Neck: No JVD, no stridor  Lungs: No use of accessory muscles, no crackles or wheezing on normal respiration, no wheeze on forced expiration  Cardiovascular: RRR, heart sounds normal, no murmur or gallops, no peripheral edema  Musculoskeletal: No deformities, no cyanosis or clubbing  Neuro: alert, awake, non focal  Skin: Warm, no lesions or rash     Assessment & Plan:  Pulmonary nodule 1 cm or greater in diameter Left lower lobe pulmonary nodule present on serial films at least back to 2016.  It has slowly enlarged, is round, solid with only minimal FDG uptake on PET scan.  Most consistent with a carcinoid tumor.  No evidence of distant disease or lymphadenopathy.  She has a history of endometrial adenocarcinoma.  We discussed watchful waiting versus pursuing a tissue diagnosis.  We have agreed to try navigational bronchoscopy, acknowledging that the nodule is in a somewhat difficult region to achieve biopsy.  Other options if we are unsuccessful include  pursuing a transthoracic needle biopsy (with increased pneumothorax risk) or going straight to resection.  We will perform PFT and work on setting up the navigational bronchoscopy for next month.   Baltazar Apo, MD, PhD 03/17/2021, 5:25 PM [facility] [phone] or if no answer before 7:00PM call [phone] For any issues after 7:00PM please call eLink [phone]

## 2021-03-17 NOTE — Assessment & Plan Note (Signed)
Left lower lobe pulmonary nodule present on serial films at least back to 2016.  It has slowly enlarged, is round, solid with only minimal FDG uptake on PET scan.  Most consistent with a carcinoid tumor.  No evidence of distant disease or lymphadenopathy.  She has a history of endometrial adenocarcinoma.  We discussed watchful waiting versus pursuing a tissue diagnosis.  We have agreed to try navigational bronchoscopy, acknowledging that the nodule is in a somewhat difficult region to achieve biopsy.  Other options if we are unsuccessful include pursuing a transthoracic needle biopsy (with increased pneumothorax risk) or going straight to resection.  We will perform PFT and work on setting up the navigational bronchoscopy for next month.

## 2021-03-17 NOTE — Patient Instructions (Signed)
We will work on setting up bronchoscopy to evaluate your pulmonary nodule.  This will be done as an outpatient at [facility] endoscopy.  You will need a designated driver and someone to watch you overnight after you go home. We will arrange for pulmonary function testing Follow with Dr Lamonte Sakai in 1 month

## 2021-03-17 NOTE — Telephone Encounter (Signed)
I scheduled pt for 10/31 at 7:30 at [facility].  CT will be at [facility] on 10/26.  Spoke to [place] and she will leave note for [place] to send disk to [facility].  Pt to have covid test on 10/28.  I gave appt info to pt.

## 2021-03-21 ENCOUNTER — Ambulatory Visit: Payer: Medicaid Other

## 2021-03-21 ENCOUNTER — Other Ambulatory Visit: Payer: Self-pay | Admitting: Family Medicine

## 2021-03-21 DIAGNOSIS — Z1231 Encounter for screening mammogram for malignant neoplasm of breast: Secondary | ICD-10-CM

## 2021-03-28 ENCOUNTER — Ambulatory Visit
Admission: RE | Admit: 2021-03-28 | Discharge: 2021-03-28 | Disposition: A | Discharge status: Home / Self Care | Payer: Medicaid Other | Source: Ambulatory Visit | Attending: Family Medicine | Admitting: Family Medicine

## 2021-03-28 ENCOUNTER — Other Ambulatory Visit: Payer: Self-pay

## 2021-03-28 DIAGNOSIS — Z1231 Encounter for screening mammogram for malignant neoplasm of breast: Secondary | ICD-10-CM

## 2021-03-28 IMAGING — MG MM DIGITAL SCREENING BILAT W/ TOMO AND CAD
8 series · 8 of 24 positions shown · non-contrast
Comparison: Previous exam(s).

CLINICAL DATA: Screening.

EXAM:
DIGITAL SCREENING BILATERAL MAMMOGRAM WITH TOMOSYNTHESIS AND CAD
TECHNIQUE: Bilateral screening digital craniocaudal and mediolateral oblique
mammograms were obtained. Bilateral screening digital breast
tomosynthesis was performed. The images were evaluated with
computer-aided detection.

[R CC synth-2D]
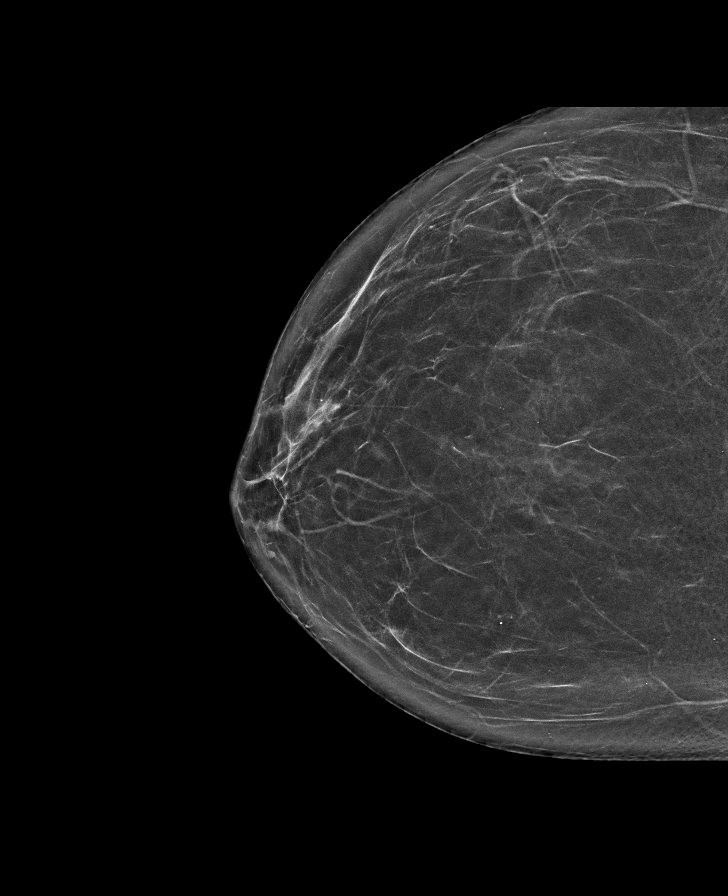

[L MLO synth-2D]
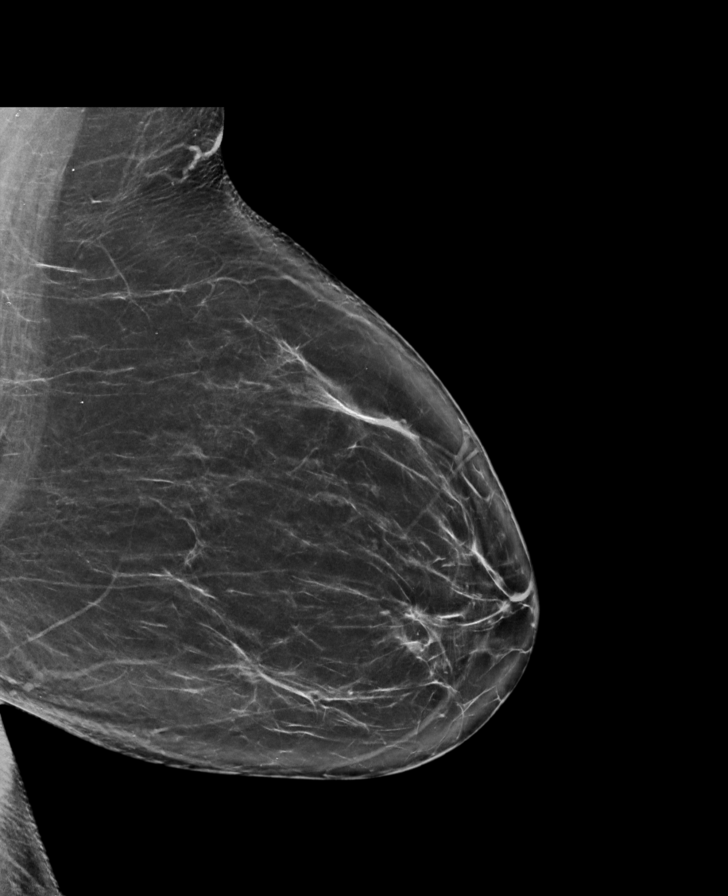

[L CC synth-2D]
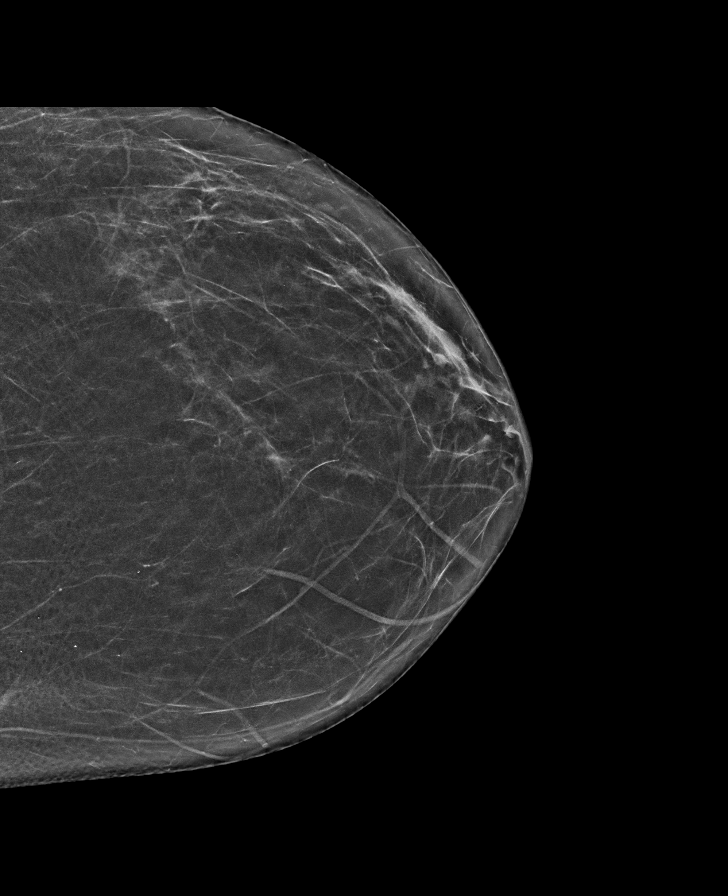

[R MLO synth-2D]
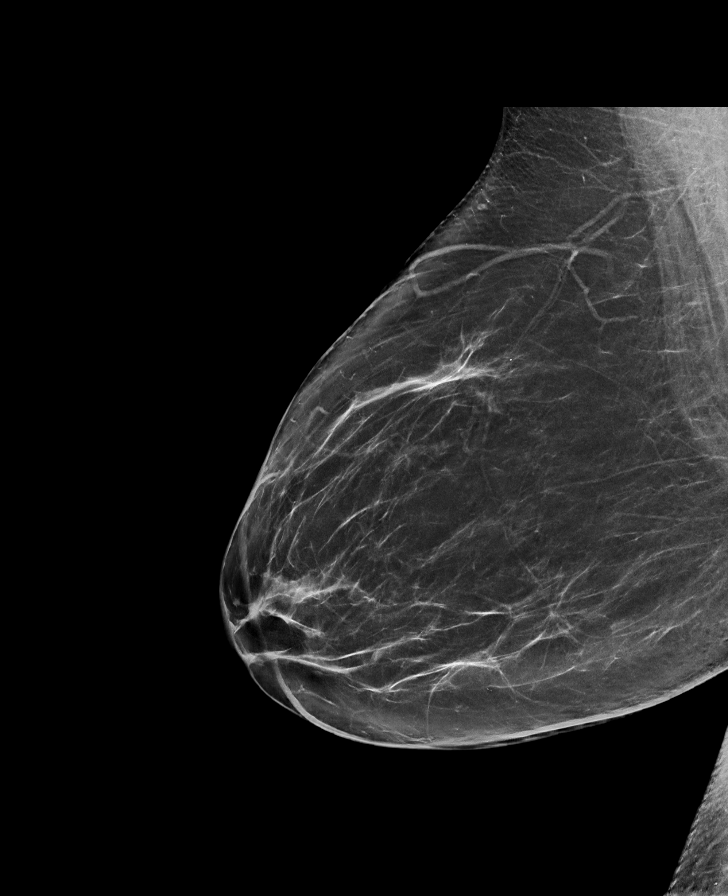

[R CC tomo · tomo slice 33/66.0]
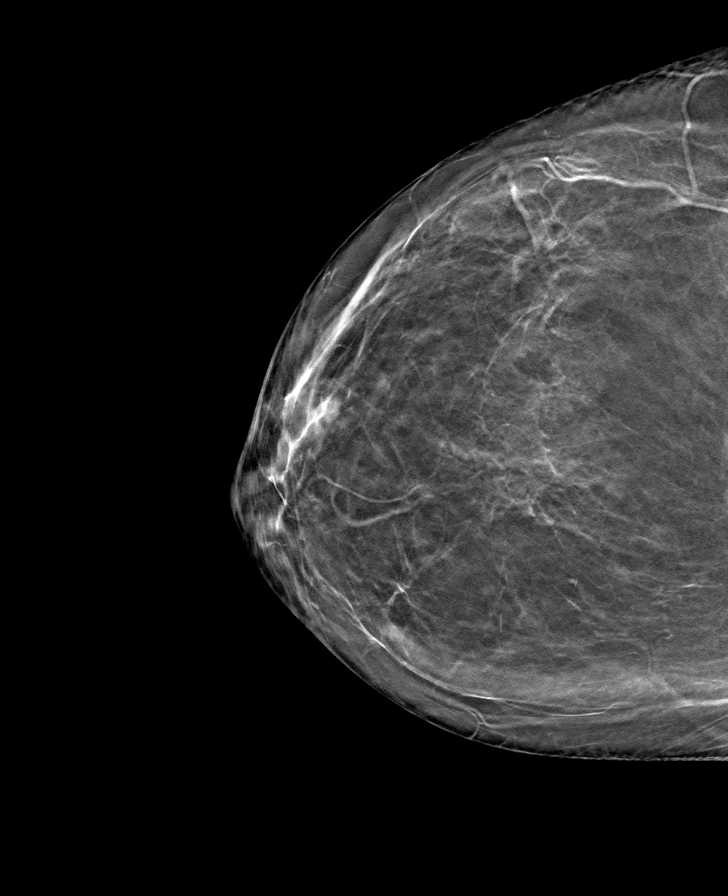

[L CC tomo · tomo slice 33/64.0]
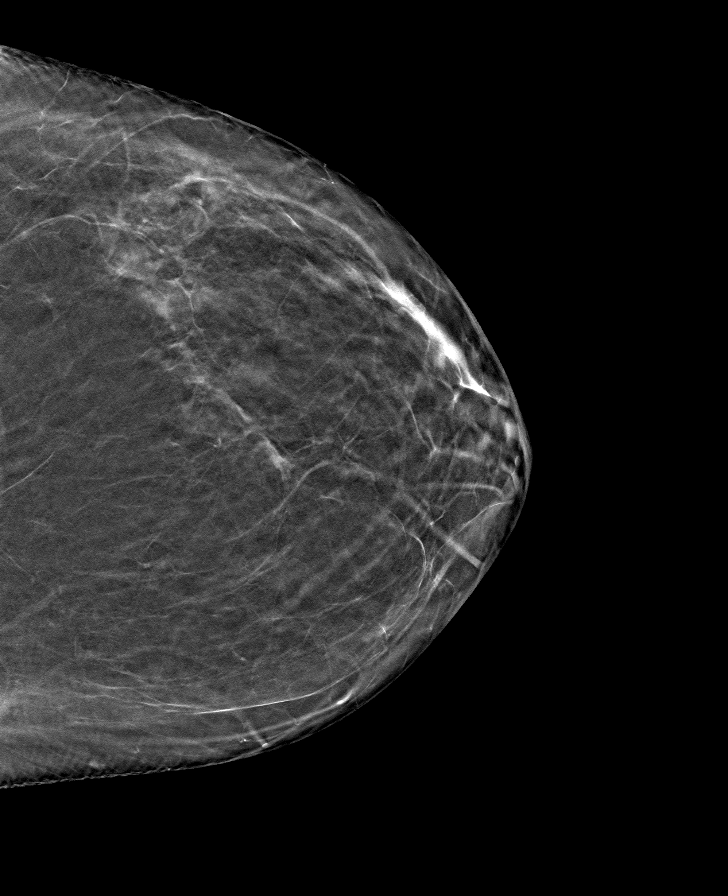

[L MLO tomo · tomo slice 41/82.0]
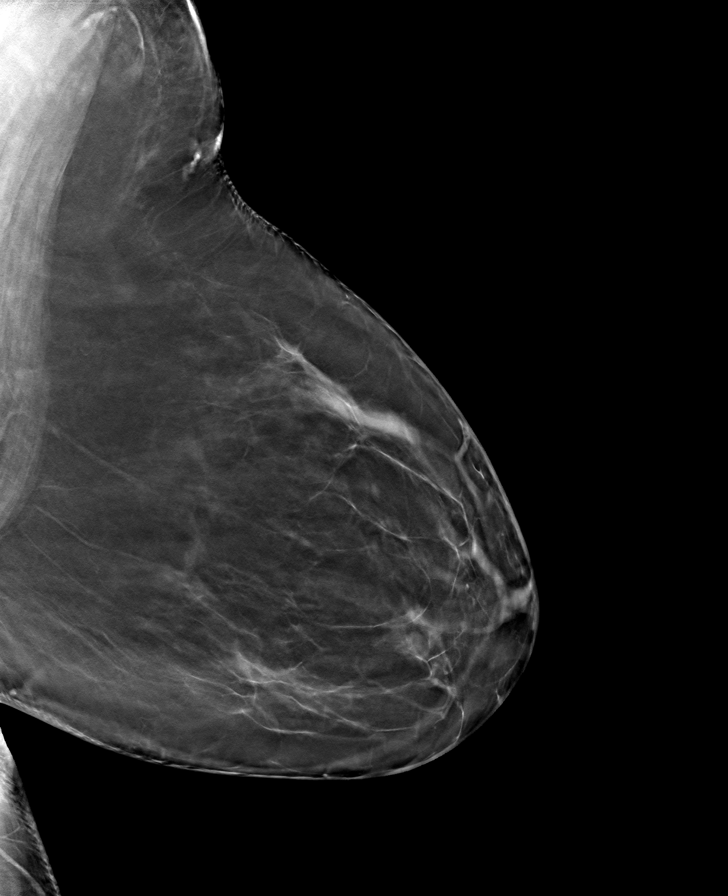

[R MLO tomo · tomo slice 43/84.0]
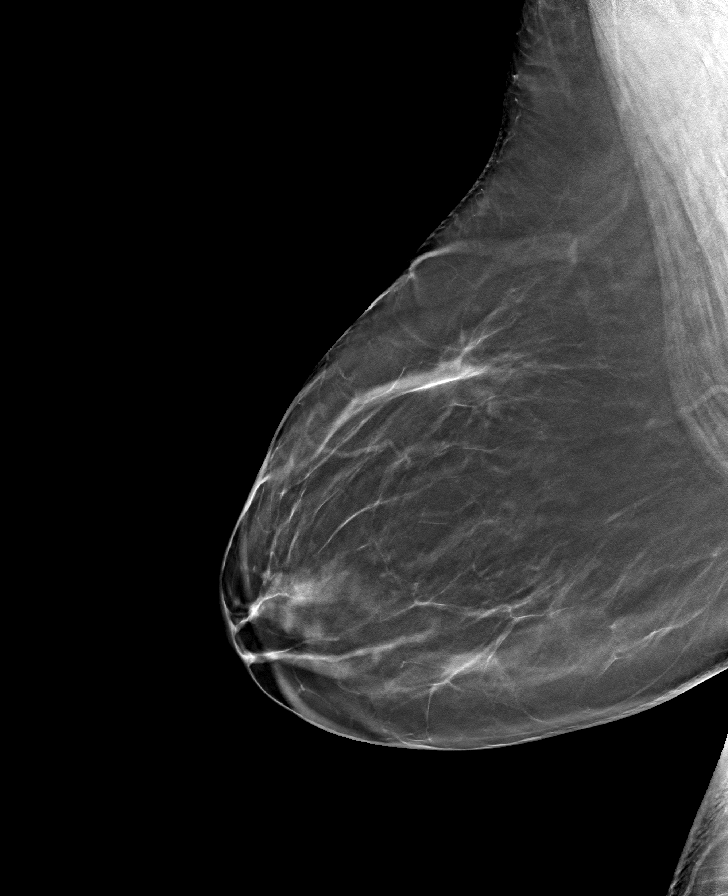

[8 of 24 positions shown; findings below may reference images not displayed]

ACR Breast Density Category b: There are scattered areas of
fibroglandular density.
FINDINGS: There are no findings suspicious for malignancy.
IMPRESSION: No mammographic evidence of malignancy. A result letter of this
screening mammogram will be mailed directly to the patient.

RECOMMENDATION:
Screening mammogram in one year. (Code:[BY])

BI-RADS CATEGORY  1: Negative.

## 2021-03-29 ENCOUNTER — Telehealth: Payer: Self-pay | Admitting: Podiatry

## 2021-03-29 ENCOUNTER — Telehealth: Payer: Self-pay | Admitting: *Deleted

## 2021-03-29 ENCOUNTER — Other Ambulatory Visit (INDEPENDENT_AMBULATORY_CARE_PROVIDER_SITE_OTHER): Payer: Medicaid Other | Admitting: Podiatry

## 2021-03-29 DIAGNOSIS — B351 Tinea unguium: Secondary | ICD-10-CM

## 2021-03-29 DIAGNOSIS — M79675 Pain in left toe(s): Secondary | ICD-10-CM

## 2021-03-29 DIAGNOSIS — M79674 Pain in right toe(s): Secondary | ICD-10-CM

## 2021-03-29 MED ORDER — CICLOPIROX 8 % EX SOLN: CUTANEOUS | 11 refills | Status: DC

## 2021-03-29 NOTE — Progress Notes (Signed)
Discontinued Rx for Jublia due to non-coverage with insurance. Sent Rx in for Ciclopirox Solution 8%. If this is not covered, we will have to go with something OTC.

## 2021-03-29 NOTE — Telephone Encounter (Signed)
Called patient to inform,thank you!

## 2021-03-29 NOTE — Telephone Encounter (Signed)
Patient stated that the prescription Jublia is not covered under her insurance and she would like an alternative. Please advise.

## 2021-03-29 NOTE — Telephone Encounter (Signed)
Called patient and informed that new prescription has been sent to pharmacy on file,verbalized understanding.

## 2021-04-07 ENCOUNTER — Telehealth: Payer: Self-pay | Admitting: Emergency Medicine

## 2021-04-07 ENCOUNTER — Other Ambulatory Visit: Payer: Self-pay | Admitting: Gastroenterology

## 2021-04-07 DIAGNOSIS — K7469 Other cirrhosis of liver: Secondary | ICD-10-CM

## 2021-04-11 ENCOUNTER — Ambulatory Visit (INDEPENDENT_AMBULATORY_CARE_PROVIDER_SITE_OTHER): Payer: Medicaid Other | Admitting: Primary Care

## 2021-04-11 DIAGNOSIS — R911 Solitary pulmonary nodule: Secondary | ICD-10-CM

## 2021-04-11 NOTE — Progress Notes (Signed)
Peer to peer with [employer] for insurance denial for site of Super D CT chest. Needs to switched site for CT Super D Chest to [facility] imaging ([address], [place]) for scheduled robotic assisted bronchoscopy with EBUS on 04/18/21 with Dr. Lamonte Sakai   Transferred to intake rep to change site / Miquel Dunn R. This has been approved, new case will be created  Lorie Apley- [id number] valid from Oct 24th, 2022 through December 8th, 2022.

## 2021-04-11 NOTE — H&P (View-Only) (Signed)
Peer to peer with [employer] for insurance denial for site of Super D CT chest. Needs to switched site for CT Super D Chest to [facility] imaging ([address], [place]) for scheduled robotic assisted bronchoscopy with EBUS on 04/18/21 with Dr. Lamonte Sakai   Transferred to intake rep to change site / Miquel Dunn R. This has been approved, new case will be created  Lorie Apley- [id number] valid from Oct 24th, 2022 through December 8th, 2022.

## 2021-04-12 ENCOUNTER — Ambulatory Visit
Admission: RE | Admit: 2021-04-12 | Discharge: 2021-04-12 | Disposition: A | Discharge status: Home / Self Care | Payer: Medicaid Other | Source: Ambulatory Visit | Attending: Emergency Medicine | Admitting: Emergency Medicine

## 2021-04-12 DIAGNOSIS — R911 Solitary pulmonary nodule: Secondary | ICD-10-CM

## 2021-04-12 IMAGING — CT CT CHEST SUPER D W/O CM
2 of 4 series · 13 of 36 positions shown, 16 images · non-contrast
Comparison: PET-CT dated [DATE]

CLINICAL DATA: Lung nodule follow-up

EXAM:
CT CHEST WITHOUT CONTRAST
TECHNIQUE: Multidetector CT imaging of the chest was performed using thin slice
collimation for electromagnetic bronchoscopy planning purposes,
without intravenous contrast.

[Series 2: chest 2.00 br40 s3 · axial · 0.69mm/px · z∈[+1543,+1809]mm · 10 of 159 slices shown, 13 images (1 of 2)]
[im 13/159  mediastinal]
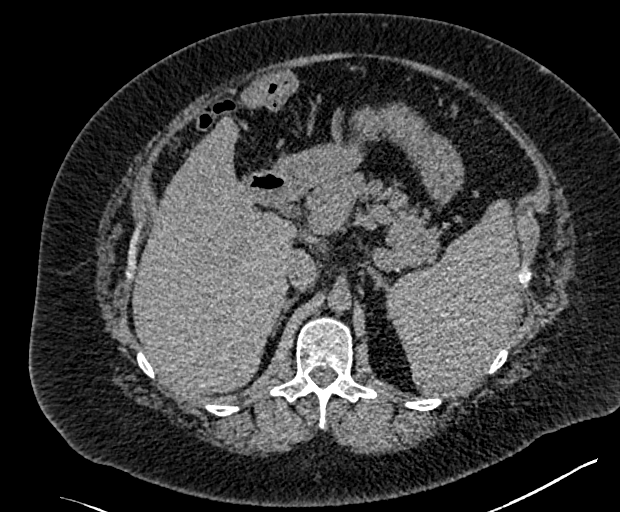
[im 13/159  lung]
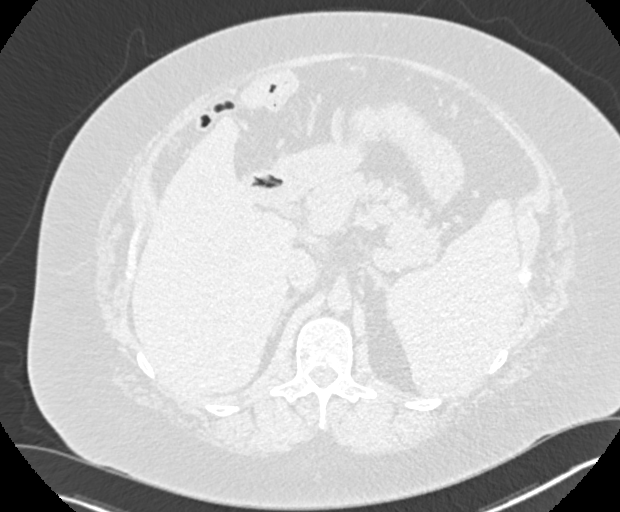
[im 25/159  lung]
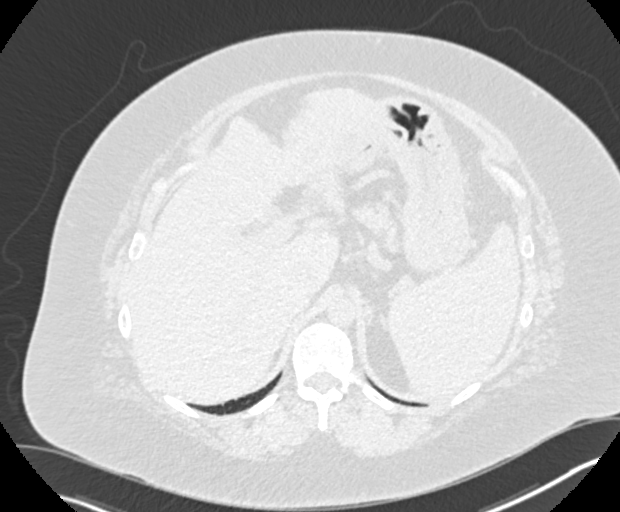
[im 49/159  lung]
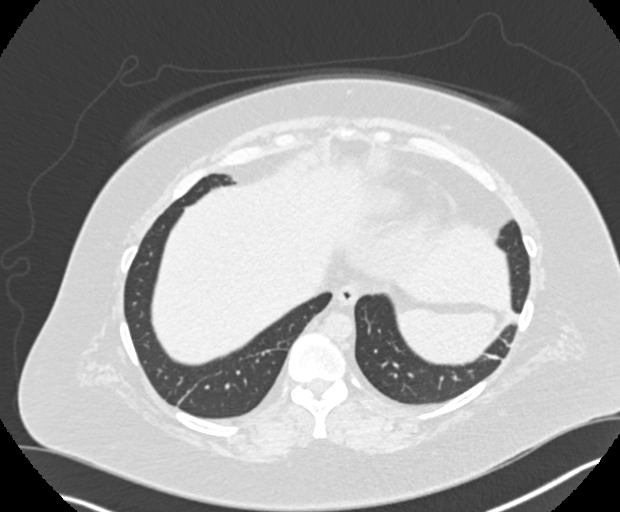
[im 61/159  lung]
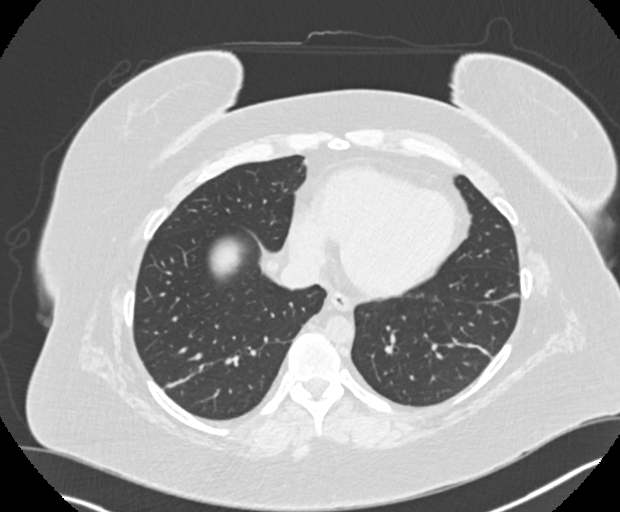
[im 73/159  mediastinal]
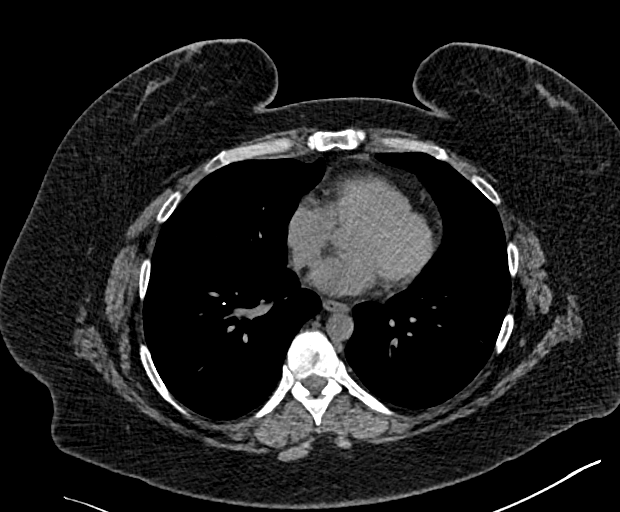
[im 73/159  lung]
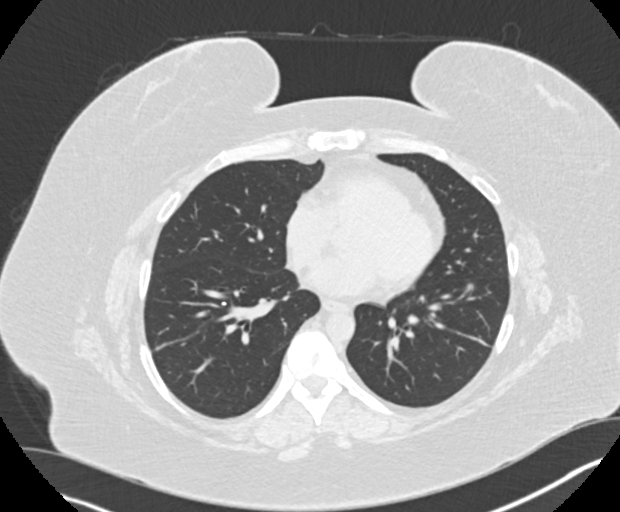
[im 86/159  lung]
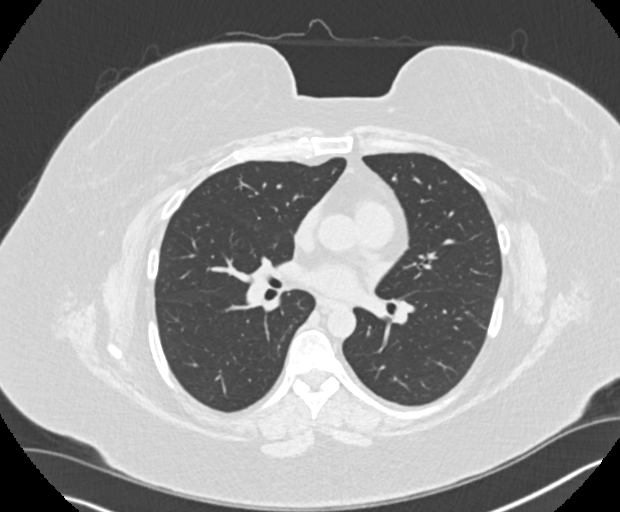
[im 98/159  lung]
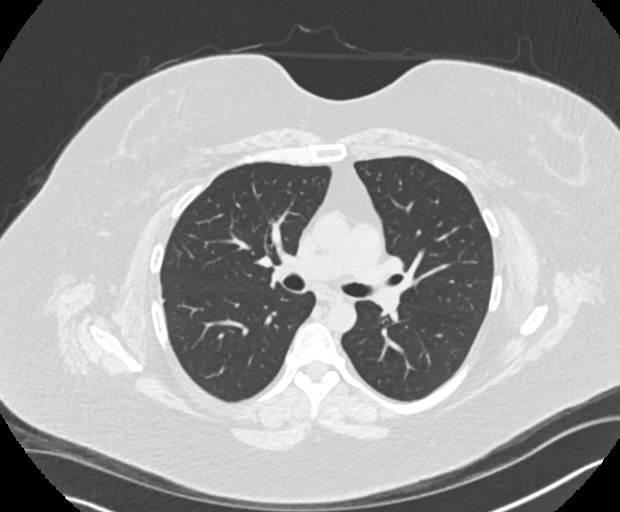
[im 122/159  lung]
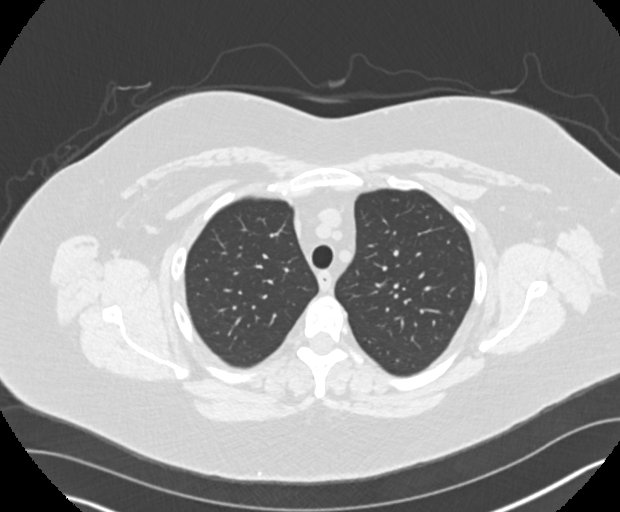
[im 134/159  mediastinal]
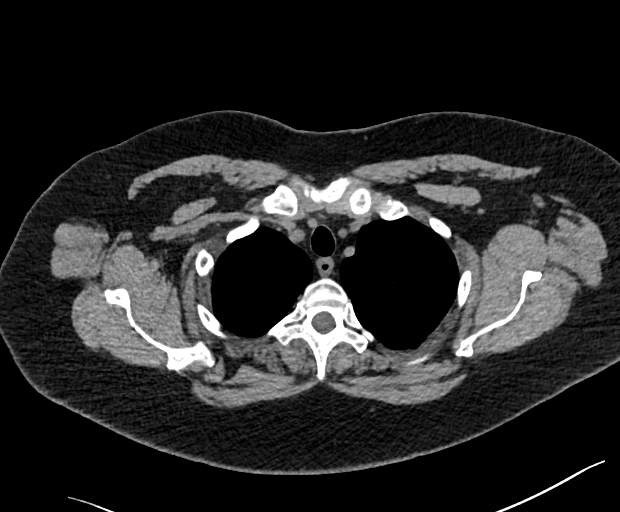
[im 134/159  lung]
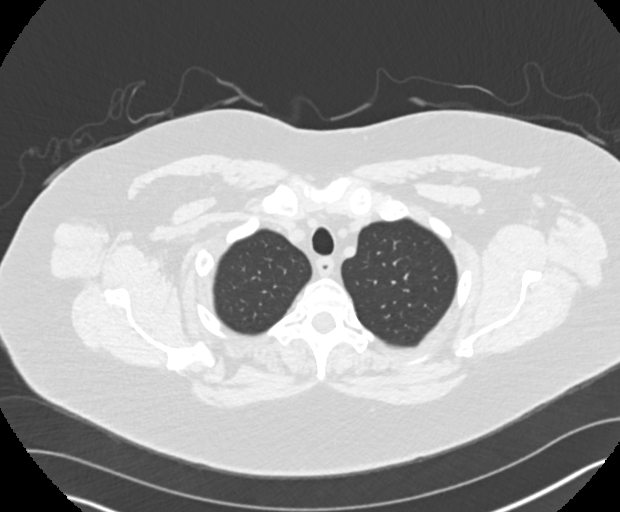
[im 146/159  lung]
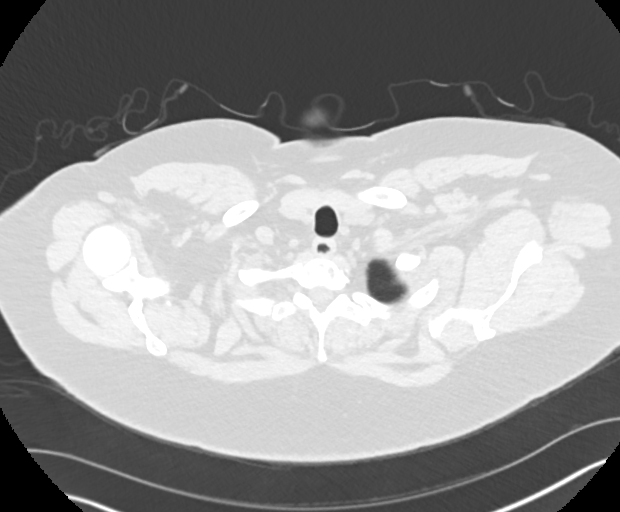

[Series 4: chest 2.00 br40 s3 · coronal · 0.62mm/px · 3 of 176 slices shown (2 of 2)]
[im 36/176  lung]
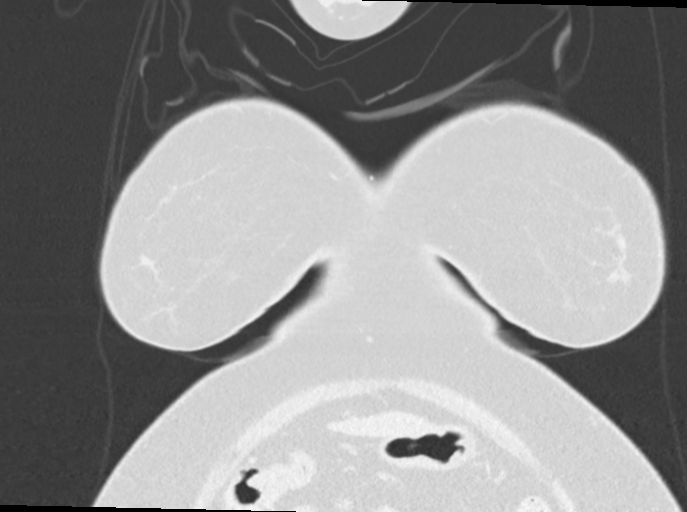
[im 71/176  lung]
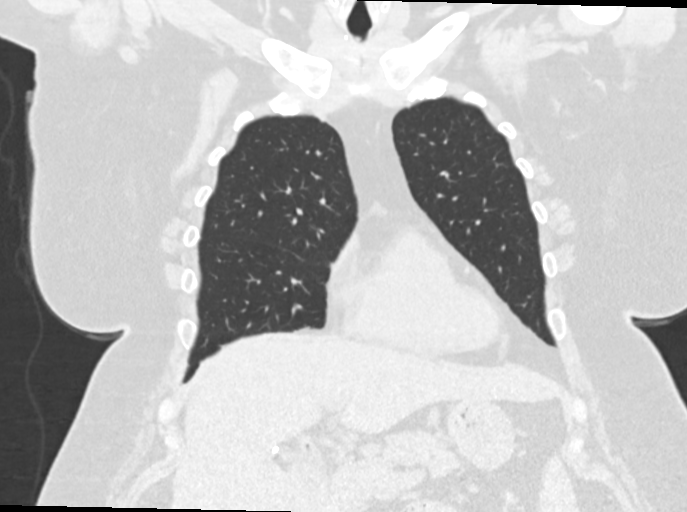
[im 106/176  lung]
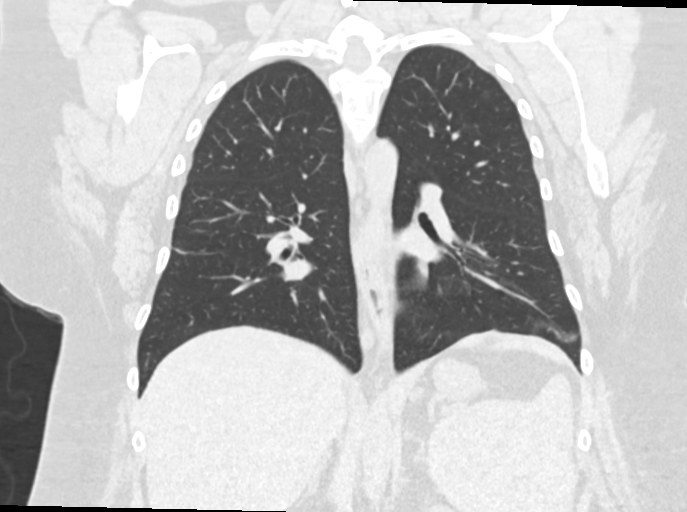

[13 of 36 positions shown; findings below may reference images not displayed]

FINDINGS: Cardiovascular: Normal heart size. No pericardial effusion. No
significant coronary artery calcifications. Mild calcifications of
the thoracic aorta.

Mediastinum/Nodes: Thyroid is surgically absent. Esophagus is
unremarkable. No pathologically enlarged lymph nodes seen in the
chest.

Lungs/Pleura: Central airways are patent. Bilateral lower lung
linear opacities, likely due to atelectasis. No consolidation,
pleural effusion or pneumothorax. Nodule of the left lower lobe
located adjacent to the hilum measuring 1.5 x 1.3 cm, unchanged in
size.

Upper Abdomen: Cholecystectomy clips.  No acute abnormality.

Musculoskeletal: No chest wall mass or suspicious bone lesions
identified.
IMPRESSION: Left lower lobe solid nodule is unchanged in size when compared with
prior PET-CT, but has slowly increased in size when compared with
remote prior exams. Finding remains concerning for low-grade
malignancy.

Aortic Atherosclerosis ([A3]-[A3]).

## 2021-04-13 ENCOUNTER — Ambulatory Visit (HOSPITAL_COMMUNITY): Payer: Medicaid Other

## 2021-04-13 NOTE — Telephone Encounter (Signed)
Pt was rescheduled to GI and she has had her CT.  Nothing further needed.

## 2021-04-14 ENCOUNTER — Encounter (HOSPITAL_COMMUNITY): Payer: Self-pay | Admitting: Emergency Medicine

## 2021-04-14 ENCOUNTER — Other Ambulatory Visit: Payer: Self-pay

## 2021-04-14 NOTE — Progress Notes (Signed)
PCP - Dr. Ronnald Ramp Cardiologist - [place]  EKG - 11/29/20 Chest x-ray - 11/23/20 ECHO - 07/20/14 Cardiac Cath - denies CPAP - pt used to have OSA but no longer does, does not use CPAP or wear any supplemental O2 at home  Fasting Blood Sugar:  82-400  Checks Blood Sugar:  1x/day (sometimes once every couple days)  Blood Thinner Instructions:  Aspirin Instructions:   ERAS Protcol - n/a  COVID TEST- 04/15/21  Anesthesia review: n/a  -------------  SDW INSTRUCTIONS:  Your procedure is scheduled on Monday 04/18/21. Please report to [facility] Main Entrance "A" at 0530 A.M., and check in at the Admitting office. Call this number if you have problems the morning of surgery: [phone]   Remember: Do not eat or drink after midnight the night before your surgery   Medications to take morning of surgery with a sip of water include: atorvastatin (LIPITOR)  buPROPion (WELLBUTRIN XL)  gabapentin (NEURONTIN)  pantoprazole (PROTONIX) SYNTHROID  ** PLEASE check your blood sugar the morning of your surgery when you wake up and every 2 hours until you get to the Short Stay unit.  If your blood sugar is less than 70 mg/dL, you will need to treat for low blood sugar: Do not take insulin. Treat a low blood sugar (less than 70 mg/dL) with  cup of clear juice (cranberry or apple), 4 glucose tablets, OR glucose gel. Recheck blood sugar in 15 minutes after treatment (to make sure it is greater than 70 mg/dL). If your blood sugar is not greater than 70 mg/dL on recheck, call [phone] for further instructions.  10/30: Glimepiride AM take as usual, PM NONE Janumet NONE Lantus AM 55 units, PM 27.5 units Victoza AM take as usual  10/31: Glimepiride NONE Janumet NONE Lantus 27.5 untis Victoza NONE  As of today, STOP taking any Aspirin (unless otherwise instructed by your surgeon), Aleve, Naproxen, Ibuprofen, Motrin, Advil, Goody's, BC's, all herbal medications, fish oil, and all  vitamins.    The Morning of Surgery Do not wear jewelry, make-up or nail polish. Do not wear lotions, powders, or perfumes, or deodorant Do not shave 48 hours prior to surgery.    Do not bring valuables to the hospital. [facility] is not responsible for any belongings or valuables.  If you are a smoker, DO NOT Smoke 24 hours prior to surgery  If you wear a CPAP at night please bring your mask the morning of surgery   Remember that you must have someone to transport you home after your surgery, and remain with you for 24 hours if you are discharged the same day.  Please bring cases for contacts, glasses, hearing aids, dentures or bridgework because it cannot be worn into surgery.   Patients discharged the day of surgery will not be allowed to drive home.   Please shower the NIGHT BEFORE/MORNING OF SURGERY (use antibacterial soap like DIAL soap if possible). Wear comfortable clothes the morning of surgery. Oral Hygiene is also important to reduce your risk of infection.  Remember - BRUSH YOUR TEETH THE MORNING OF SURGERY WITH YOUR REGULAR TOOTHPASTE  Patient denies shortness of breath, fever, cough and chest pain.

## 2021-04-15 ENCOUNTER — Other Ambulatory Visit: Payer: Self-pay | Admitting: Emergency Medicine

## 2021-04-15 LAB — SARS CORONAVIRUS 2 (TAT 6-24 HRS): SARS Coronavirus 2: NEGATIVE

## 2021-04-15 NOTE — Anesthesia Preprocedure Evaluation (Addendum)
Anesthesia Evaluation  Patient identified by MRN, date of birth, ID band Patient awake    Reviewed: Allergy & Precautions, NPO status , Patient's Chart, lab work & pertinent test results  Airway Mallampati: III  TM Distance: >3 FB Neck ROM: Full    Dental  (+) Missing   Pulmonary    Pulmonary exam normal breath sounds clear to auscultation       Cardiovascular negative cardio ROS Normal cardiovascular exam Rhythm:Regular Rate:Normal     Neuro/Psych PSYCHIATRIC DISORDERS Depression negative neurological ROS     GI/Hepatic Neg liver ROS, GERD  Medicated and Controlled,  Endo/Other  diabetes, Oral Hypoglycemic Agents, Insulin DependentHypothyroidism Morbid obesity  Renal/GU negative Renal ROS     Musculoskeletal negative musculoskeletal ROS (+)   Abdominal (+) + obese,   Peds  Hematology HLD   Anesthesia Other Findings PULMONARY NODULE LEFT LOWER  Reproductive/Obstetrics                            Anesthesia Physical Anesthesia Plan  ASA: 3  Anesthesia Plan: General   Post-op Pain Management:    Induction: Intravenous  PONV Risk Score and Plan: 3 and Ondansetron, Dexamethasone, Midazolam and Treatment may vary due to age or medical condition  Airway Management Planned: Oral ETT  Additional Equipment:   Intra-op Plan:   Post-operative Plan: Extubation in OR  Informed Consent: I have reviewed the patients History and Physical, chart, labs and discussed the procedure including the risks, benefits and alternatives for the proposed anesthesia with the patient or authorized representative who has indicated his/her understanding and acceptance.     Dental advisory given  Plan Discussed with: CRNA  Anesthesia Plan Comments: (Reviewed PAT note written 04/15/2021 by Myra Gianotti, PA-C. )       Anesthesia Quick Evaluation

## 2021-04-15 NOTE — Progress Notes (Addendum)
Anesthesia Chart Review: Rhonda Higgins  Case: [id number] Date/Time: 04/18/21 0730   Procedure: ROBOTIC ASSISTED BRONCHOSCOPY WITH ENDOBRONCHIAL NAVIGATION (Left)   Anesthesia type: General   Pre-op diagnosis: PULMONARY NODULE LEFT LOWER   Location: [facility] / [facility]   Surgeons: Collene Gobble, MD       DISCUSSION: Patient is a 50 year old female scheduled for the above procedure. By pulmonary notes, she has a LLL pulmonary nodule which has slowly enlarged over the past five years and with minimal FDG uptake on PET scan. A carcinoid tumor is favored, cannot rule out low grade malignancy. S/p surgery and radiation for endometrial cancer in 2011. She desired pursing tissue diagnosis over watchful waiting.  History includes never smoker, postoperative N/V, HLD, GERD, Stage 1B endometrial carcinoma (s/p TAH/BSO, pelvic LN dissection, radiation 2011), DM2, thyroidectomy (for multi-nodular goiter 11/19/15), post-surgical hypothyroidism, cirrhosis, OSA (severe OSA 03/2010 with AHI of 62/hr and desat to 82% 03/2010; snoring with no significant OSA 12/13/17 sleep study), atypical chest pain (history of; normal stress test 2009, saw Dr. Terrence Dupont), personality disorder, cholecystectomy (09/06/17;  ERCP/extraction 2 CBD stone 09/07/17).  She is a same-day work-up.  A1c 7.4% last year.  Reportedly home CBGs run 82-400, and she only checks her CBGs once every 1-2 days. She is for labs and anesthesia team evaluation on the day of surgery.  Preoperative COVID-19 test is scheduled for 04/15/2021.   VS: Ht 5\' 3"  (1.6 m)   Wt 105.2 kg   BMI 41.10 kg/m  BP Readings from Last 3 Encounters:  03/17/21 136/80  11/24/20 132/80  11/06/20 126/79   Pulse Readings from Last 3 Encounters:  03/17/21 89  11/24/20 80  11/06/20 70     PROVIDERS: [name], MD is PCP  Charolette Forward, MD is cardiologist. Last visit 01/17/21. No CV symptoms reported. No new testing ordered.   [name], MD is  GI Gery Pray, MD is RAD-ONC   LABS: For day of surgery. As of 11/24/20, Cr 0.50, AST 51, ALT 43, H/H 11.9/39.0, PLT 102. A1c 7.4% 04/03/20.    Polysomnogram 12/13/17: IMPRESSION: Primary Snoring Dysfunctions associated with sleep stages or arousal from sleep RECOMMENDATIONS: This study does not demonstrate any significant obstructive or central sleep disordered breathing with the exception of snoring, which was mild to moderate, and mild REM related OSA. CPAP therapy is not warranted; avoidance of the supine sleep position and weight will likely suffice as treatment. For disturbing snoring, an oral appliance (through a qualified dentist) can be considered...   IMAGES: CT Super D Chest 04/12/21: IMPRESSION: Left lower lobe solid nodule is unchanged in size when compared with prior PET-CT, but has slowly increased in size when compared with remote prior exams. Finding remains concerning for low-grade malignancy. Aortic Atherosclerosis (ICD10-I70.0).     EKG: 11/24/20: Sinus rhythm Low voltage, precordial leads Borderline T abnormalities, anterior leads 12 Lead; Mason-Likar No significant change since last tracing Confirmed by Dorie Rank [phone]) on 11/24/2020 5:15:41 PM   CV: Echo 07/20/14: Study Conclusions  - Left ventricle: The cavity size was normal. Systolic function was    normal. The estimated ejection fraction was in the range of 55%    to 60%. Wall motion was normal; there were no regional wall    motion abnormalities. Left ventricular diastolic function    parameters were normal.  - Atrial septum: No defect or patent foramen ovale was identified.   Nuclear stress test 12/22/209: IMPRESSION:  No evidence for ischemia.  Normal wall motion and ejection fraction measures 83%.   Past Medical History:  Diagnosis Date   Adenocarcinoma ([place])    endometrial, FIGO GRADE 1   Allergic rhinitis    Atypical chest pain    History of   Depression    Elevated liver enzymes     GERD (gastroesophageal reflux disease)    Hematuria    History of endometrial cancer 08-02-2009   oncologist-  dr brewster/ Denman George and dr kinard/  no recurrence   endometrial adenocarinoma Stage 1B, Grade 1, FIGO--  s/p  TAH w/ BSO and pelvic lymph node dissection's and radiation therapy   History of kidney stones    History of radiation therapy    2011  pelvic intracavity brachytherapy treatment's for endometrial carcinoma   History of thyroid nodule    multinodular goiter s/p  total thyroidectomy 11-19-2015  per pathology -  adenomatoid nodules   Hyperlipidemia    Hypothyroidism, postsurgical    Insulin dependent diabetes mellitus    Type 2   Left ureteral stone    Obesity    OSA (obstructive sleep apnea)    severe OSA  per study 03-08-2010--  noncomplant cpap (no more sleep apnes since lost weight 2019)   Overactive bladder    Personality disorder (HCC)    Polyphagia(783.6)    PONV (postoperative nausea and vomiting)    after ear surgery only one time   Right lower quadrant pain    Urgency of urination    UTI (urinary tract infection)     Past Surgical History:  Procedure Laterality Date   CARDIOVASCULAR STRESS TEST  06/09/2008   normal nuclear study w/ no ischemia/  normal LV function and wall motion , ef 83%   CHOLECYSTECTOMY N/A 09/06/2017   Procedure: LAPAROSCOPIC CHOLECYSTECTOMY WITH INTRAOPERATIVE CHOLANGIOGRAM;  Surgeon: Armandina Gemma, MD;  Location: [facility];  Service: General;  Laterality: N/A;   CYSTOSCOPY/RETROGRADE/URETEROSCOPY/STONE EXTRACTION WITH BASKET Left 03/08/2016   Procedure: CYSTOSCOPY/RETROGRADE/URETEROSCOPY/STONE EXTRACTION WITH BASKET, STENT PLACEMENT;  Surgeon: Rana Snare, MD;  Location: [facility];  Service: Urology;  Laterality: Left;   ENDOMETRIAL BIOPSY     ERCP N/A 09/07/2017   Procedure: ENDOSCOPIC RETROGRADE CHOLANGIOPANCREATOGRAPHY (ERCP);  Surgeon: [name], MD;  Location: [facility];  Service: Gastroenterology;   Laterality: N/A;   ESOPHAGOGASTRODUODENOSCOPY (EGD) WITH PROPOFOL N/A 03/16/2020   Procedure: ESOPHAGOGASTRODUODENOSCOPY (EGD) WITH PROPOFOL;  Surgeon: [name], MD;  Location: [facility];  Service: Gastroenterology;  Laterality: N/A;  unable to locate ampulla, aborted ERCP, changed to EGD   HOLMIUM LASER APPLICATION Left 5/46/5035   Procedure: HOLMIUM LASER APPLICATION;  Surgeon: Rana Snare, MD;  Location: [facility];  Service: Urology;  Laterality: Left;   MOUTH SURGERY     MYRINGECOTMY W/ REMOVAL MIDDLE EAR CHOLESTEATOMA TYPE 1 FASICA TYMPANOPLASTY  09/13/2000   ROBOTIC ASSISTED TOTAL HYSTERECTOMY WITH BILATERAL SALPINGO OOPHERECTOMY  08-02-2009   at [facility]  dr Denman George   w/  Bilateral pelvic and para aortic lymph node dissection's   THYROIDECTOMY N/A 11/19/2015   Procedure: TOTAL THYROIDECTOMY;  Surgeon: Armandina Gemma, MD;  Location: [facility];  Service: General;  Laterality: N/A;   TONSILLECTOMY  age 86   TRANSTHORACIC ECHOCARDIOGRAM  07/19/2014   ef 55-60%/  trivial TR   TYMPANOPLASTY Right 1993    MEDICATIONS: No current facility-administered medications for this encounter.    atorvastatin (LIPITOR) 40 MG tablet   buPROPion (WELLBUTRIN XL) 300 MG 24 hr tablet   Cetirizine HCl (ZYRTEC ALLERGY) 10 MG  CAPS   gabapentin (NEURONTIN) 400 MG capsule   glimepiride (AMARYL) 4 MG tablet   hydrOXYzine (ATARAX/VISTARIL) 25 MG tablet   ibuprofen (ADVIL) 200 MG tablet   Ibuprofen (MOTRIN PO)   JANUMET 50-1000 MG tablet   LANTUS SOLOSTAR 100 UNIT/ML Solostar Pen   liraglutide (VICTOZA) 18 MG/3ML SOPN   lisinopril (PRINIVIL,ZESTRIL) 5 MG tablet   metoCLOPramide (REGLAN) 5 MG tablet   ondansetron (ZOFRAN-ODT) 4 MG disintegrating tablet   pantoprazole (PROTONIX) 40 MG tablet   risperiDONE (RISPERDAL) 2 MG tablet   sucralfate (CARAFATE) 1 g tablet   SYNTHROID 175 MCG tablet   traZODone (DESYREL) 50 MG tablet   Vitamin D, Cholecalciferol, 25 MCG (1000 UT) TABS   ACCU-CHEK GUIDE  test strip   Accu-Chek Softclix Lancets lancets   ciclopirox (PENLAC) 8 % solution   linagliptin (TRADJENTA) 5 MG TABS tablet   metFORMIN (GLUCOPHAGE) 1000 MG tablet   naproxen (NAPROSYN) 375 MG tablet   polyethylene glycol-electrolytes (NULYTELY) 420 g solution   traMADol (ULTRAM) 50 MG tablet    Myra Gianotti, PA-C Surgical Short Stay/Anesthesiology [facility] Phone [phone] [facility] Phone [phone] 04/15/2021 3:25 PM

## 2021-04-18 ENCOUNTER — Encounter (HOSPITAL_COMMUNITY)
Admission: RE | Disposition: A | Discharge status: Home / Self Care | Payer: Self-pay | Source: Home / Self Care | Attending: Emergency Medicine

## 2021-04-18 ENCOUNTER — Encounter (HOSPITAL_COMMUNITY): Payer: Self-pay | Admitting: Emergency Medicine

## 2021-04-18 ENCOUNTER — Ambulatory Visit (HOSPITAL_COMMUNITY)
Admission: RE | Admit: 2021-04-18 | Discharge: 2021-04-18 | Disposition: A | Discharge status: Home / Self Care | Payer: Medicaid Other | Attending: Emergency Medicine | Admitting: Emergency Medicine

## 2021-04-18 ENCOUNTER — Ambulatory Visit (HOSPITAL_COMMUNITY): Payer: Medicaid Other

## 2021-04-18 ENCOUNTER — Ambulatory Visit (HOSPITAL_COMMUNITY): Payer: Medicaid Other | Admitting: Vascular Surgery

## 2021-04-18 ENCOUNTER — Other Ambulatory Visit: Payer: Self-pay

## 2021-04-18 DIAGNOSIS — E1143 Type 2 diabetes mellitus with diabetic autonomic (poly)neuropathy: Secondary | ICD-10-CM | POA: Diagnosis not present

## 2021-04-18 DIAGNOSIS — Z7985 Long-term (current) use of injectable non-insulin antidiabetic drugs: Secondary | ICD-10-CM | POA: Insufficient documentation

## 2021-04-18 DIAGNOSIS — I700 Atherosclerosis of aorta: Secondary | ICD-10-CM | POA: Diagnosis not present

## 2021-04-18 DIAGNOSIS — D3A8 Other benign neuroendocrine tumors: Secondary | ICD-10-CM | POA: Diagnosis not present

## 2021-04-18 DIAGNOSIS — R911 Solitary pulmonary nodule: Secondary | ICD-10-CM | POA: Diagnosis not present

## 2021-04-18 DIAGNOSIS — Z8542 Personal history of malignant neoplasm of other parts of uterus: Secondary | ICD-10-CM | POA: Insufficient documentation

## 2021-04-18 DIAGNOSIS — Z79890 Hormone replacement therapy (postmenopausal): Secondary | ICD-10-CM | POA: Insufficient documentation

## 2021-04-18 DIAGNOSIS — Z794 Long term (current) use of insulin: Secondary | ICD-10-CM | POA: Diagnosis not present

## 2021-04-18 DIAGNOSIS — K3184 Gastroparesis: Secondary | ICD-10-CM | POA: Diagnosis not present

## 2021-04-18 DIAGNOSIS — Z90710 Acquired absence of both cervix and uterus: Secondary | ICD-10-CM | POA: Insufficient documentation

## 2021-04-18 DIAGNOSIS — Z885 Allergy status to narcotic agent status: Secondary | ICD-10-CM | POA: Diagnosis not present

## 2021-04-18 DIAGNOSIS — Z419 Encounter for procedure for purposes other than remedying health state, unspecified: Secondary | ICD-10-CM

## 2021-04-18 DIAGNOSIS — Z888 Allergy status to other drugs, medicaments and biological substances status: Secondary | ICD-10-CM | POA: Diagnosis not present

## 2021-04-18 DIAGNOSIS — Z90722 Acquired absence of ovaries, bilateral: Secondary | ICD-10-CM | POA: Diagnosis not present

## 2021-04-18 DIAGNOSIS — K589 Irritable bowel syndrome without diarrhea: Secondary | ICD-10-CM | POA: Insufficient documentation

## 2021-04-18 DIAGNOSIS — Z79899 Other long term (current) drug therapy: Secondary | ICD-10-CM | POA: Diagnosis not present

## 2021-04-18 DIAGNOSIS — Z7984 Long term (current) use of oral hypoglycemic drugs: Secondary | ICD-10-CM | POA: Insufficient documentation

## 2021-04-18 DIAGNOSIS — Z9889 Other specified postprocedural states: Secondary | ICD-10-CM

## 2021-04-18 HISTORY — PX: BRONCHIAL NEEDLE ASPIRATION BIOPSY: SHX5106

## 2021-04-18 HISTORY — PX: VIDEO BRONCHOSCOPY WITH RADIAL ENDOBRONCHIAL ULTRASOUND: SHX6849

## 2021-04-18 HISTORY — PX: BRONCHIAL BIOPSY: SHX5109

## 2021-04-18 HISTORY — PX: VIDEO BRONCHOSCOPY WITH ENDOBRONCHIAL NAVIGATION: SHX6175

## 2021-04-18 HISTORY — PX: BRONCHIAL BRUSHINGS: SHX5108

## 2021-04-18 LAB — CBC
HCT: 39.5 % (ref 36.0–46.0)
Hemoglobin: 12 g/dL (ref 12.0–15.0)
MCH: 24.6 pg — ABNORMAL LOW (ref 26.0–34.0)
MCHC: 30.4 g/dL (ref 30.0–36.0)
MCV: 80.9 fL (ref 80.0–100.0)
Platelets: 81 10*3/uL — ABNORMAL LOW (ref 150–400)
RBC: 4.88 MIL/uL (ref 3.87–5.11)
RDW: 14.7 % (ref 11.5–15.5)
WBC: 3 10*3/uL — ABNORMAL LOW (ref 4.0–10.5)
nRBC: 0 % (ref 0.0–0.2)

## 2021-04-18 LAB — BASIC METABOLIC PANEL
Anion gap: 9 (ref 5–15)
BUN: 5 mg/dL — ABNORMAL LOW (ref 6–20)
CO2: 24 mmol/L (ref 22–32)
Calcium: 8.1 mg/dL — ABNORMAL LOW (ref 8.9–10.3)
Chloride: 104 mmol/L (ref 98–111)
Creatinine, Ser: 0.64 mg/dL (ref 0.44–1.00)
GFR, Estimated: >60 mL/min (ref >60)
Glucose, Bld: 320 mg/dL — ABNORMAL HIGH (ref 70–99)
Potassium: 3.7 mmol/L (ref 3.5–5.1)
Sodium: 137 mmol/L (ref 135–145)

## 2021-04-18 LAB — GLUCOSE, CAPILLARY
Glucose-Capillary: 342 mg/dL — ABNORMAL HIGH (ref 70–99)
Glucose-Capillary: 280 mg/dL — ABNORMAL HIGH (ref 70–99)
Glucose-Capillary: 216 mg/dL — ABNORMAL HIGH (ref 70–99)

## 2021-04-18 SURGERY — VIDEO BRONCHOSCOPY WITH ENDOBRONCHIAL NAVIGATION
Anesthesia: General

## 2021-04-18 MED ORDER — EPHEDRINE SULFATE-NACL 50-0.9 MG/10ML-% IV SOSY
PREFILLED_SYRINGE | INTRAVENOUS | Status: DC | PRN
  Administered 2021-04-18: 5 mg via INTRAVENOUS

## 2021-04-18 MED ORDER — FENTANYL CITRATE (PF) 100 MCG/2ML IJ SOLN
INTRAMUSCULAR | Status: DC | PRN
  Administered 2021-04-18: 100 ug via INTRAVENOUS

## 2021-04-18 MED ORDER — TRAZODONE HCL 50 MG PO TABS: 50.0000 mg | ORAL_TABLET | Freq: Every day | ORAL | Status: DC

## 2021-04-18 MED ORDER — DEXAMETHASONE SODIUM PHOSPHATE 10 MG/ML IJ SOLN
INTRAMUSCULAR | Status: DC | PRN
  Administered 2021-04-18: 4 mg via INTRAVENOUS

## 2021-04-18 MED ORDER — LACTATED RINGERS IV SOLN: INTRAVENOUS | Status: DC

## 2021-04-18 MED ORDER — PROPOFOL 10 MG/ML IV BOLUS
INTRAVENOUS | Status: DC | PRN
  Administered 2021-04-18: 200 mg via INTRAVENOUS

## 2021-04-18 MED ORDER — PROMETHAZINE HCL 25 MG/ML IJ SOLN: 6.2500 mg | INTRAMUSCULAR | Status: DC | PRN

## 2021-04-18 MED ORDER — INSULIN ASPART 100 UNIT/ML IJ SOLN
0.0000 [IU] | INTRAMUSCULAR | Status: DC
  Administered 2021-04-18: 4 [IU] via SUBCUTANEOUS

## 2021-04-18 MED ORDER — AMISULPRIDE (ANTIEMETIC) 5 MG/2ML IV SOLN: 10.0000 mg | Freq: Once | INTRAVENOUS | Status: DC | PRN

## 2021-04-18 MED ORDER — PHENYLEPHRINE 40 MCG/ML (10ML) SYRINGE FOR IV PUSH (FOR BLOOD PRESSURE SUPPORT): PREFILLED_SYRINGE | INTRAVENOUS | Status: DC | PRN

## 2021-04-18 MED ORDER — CHLORHEXIDINE GLUCONATE 0.12 % MT SOLN
OROMUCOSAL | Status: AC
  Administered 2021-04-18: 15 mL via OROMUCOSAL
  Filled 2021-04-18: qty 15

## 2021-04-18 MED ORDER — ROCURONIUM BROMIDE 10 MG/ML (PF) SYRINGE
PREFILLED_SYRINGE | INTRAVENOUS | Status: DC | PRN
  Administered 2021-04-18: 60 mg via INTRAVENOUS
  Administered 2021-04-18: 20 mg via INTRAVENOUS

## 2021-04-18 MED ORDER — LIDOCAINE 2% (20 MG/ML) 5 ML SYRINGE
INTRAMUSCULAR | Status: DC | PRN
Start: 2021-04-18 — End: 2021-04-18
  Administered 2021-04-18: 60 mg via INTRAVENOUS

## 2021-04-18 MED ORDER — ALBUTEROL SULFATE HFA 108 (90 BASE) MCG/ACT IN AERS
INHALATION_SPRAY | RESPIRATORY_TRACT | Status: DC | PRN
  Administered 2021-04-18: 6 via RESPIRATORY_TRACT

## 2021-04-18 MED ORDER — INSULIN ASPART 100 UNIT/ML IJ SOLN
INTRAMUSCULAR | Status: AC
  Filled 2021-04-18: qty 1

## 2021-04-18 MED ORDER — PHENYLEPHRINE HCL-NACL 20-0.9 MG/250ML-% IV SOLN
INTRAVENOUS | Status: DC | PRN
  Administered 2021-04-18: 20 ug/min via INTRAVENOUS

## 2021-04-18 MED ORDER — SUGAMMADEX SODIUM 200 MG/2ML IV SOLN
INTRAVENOUS | Status: DC | PRN
  Administered 2021-04-18: 400 mg via INTRAVENOUS
  Administered 2021-04-18: 200 mg via INTRAVENOUS

## 2021-04-18 MED ORDER — ACETAMINOPHEN 10 MG/ML IV SOLN: 1000.0000 mg | Freq: Once | INTRAVENOUS | Status: DC | PRN

## 2021-04-18 MED ORDER — MIDAZOLAM HCL 2 MG/2ML IJ SOLN
INTRAMUSCULAR | Status: DC | PRN
  Administered 2021-04-18: 2 mg via INTRAVENOUS

## 2021-04-18 MED ORDER — FENTANYL CITRATE (PF) 100 MCG/2ML IJ SOLN: 25.0000 ug | INTRAMUSCULAR | Status: DC | PRN

## 2021-04-18 MED ORDER — CHLORHEXIDINE GLUCONATE 0.12 % MT SOLN
15.0000 mL | Freq: Once | OROMUCOSAL | Status: AC
  Filled 2021-04-18: qty 15

## 2021-04-18 MED ORDER — ONDANSETRON HCL 4 MG/2ML IJ SOLN
INTRAMUSCULAR | Status: DC | PRN
  Administered 2021-04-18: 4 mg via INTRAVENOUS

## 2021-04-18 NOTE — Anesthesia Postprocedure Evaluation (Signed)
Anesthesia Post Note  Patient: Shaliah L Bynum Applegarth  Procedure(s) Performed: ROBOTIC ASSISTED BRONCHOSCOPY WITH ENDOBRONCHIAL NAVIGATION (Left) BRONCHIAL BIOPSIES BRONCHIAL NEEDLE ASPIRATION BIOPSIES BRONCHIAL BRUSHINGS RADIAL ENDOBRONCHIAL ULTRASOUND     Patient location during evaluation: PACU Anesthesia Type: General Level of consciousness: awake Pain management: pain level controlled Vital Signs Assessment: post-procedure vital signs reviewed and stable Respiratory status: spontaneous breathing, nonlabored ventilation, respiratory function stable and patient connected to nasal cannula oxygen Cardiovascular status: blood pressure returned to baseline and stable Postop Assessment: no apparent nausea or vomiting Anesthetic complications: no   No notable events documented.  Last Vitals:  Vitals:   04/18/21 1355 04/18/21 1410  BP: (!) 107/56   Pulse: (!) 104 (!) 111  Resp: 14   Temp:    SpO2: 93% 96%    Last Pain:  Vitals:   04/18/21 1355  TempSrc:   PainSc: 0-No pain                 Raul Torrance P Satin Boal

## 2021-04-18 NOTE — Anesthesia Procedure Notes (Signed)
Procedure Name: Intubation Date/Time: 04/18/2021 11:43 AM Performed by: Thelma Comp, CRNA Pre-anesthesia Checklist: Patient identified, Emergency Drugs available, Suction available and Patient being monitored Patient Re-evaluated:Patient Re-evaluated prior to induction Oxygen Delivery Method: Circle System Utilized Preoxygenation: Pre-oxygenation with 100% oxygen Induction Type: IV induction Ventilation: Mask ventilation without difficulty Laryngoscope Size: Mac and 3 Grade View: Grade I Tube type: Oral Tube size: 8.5 mm Number of attempts: 1 Airway Equipment and Method: Stylet Placement Confirmation: ETT inserted through vocal cords under direct vision, positive ETCO2 and breath sounds checked- equal and bilateral Secured at: 23 cm Tube secured with: Tape Dental Injury: Teeth and Oropharynx as per pre-operative assessment

## 2021-04-18 NOTE — Interval H&P Note (Signed)
History and Physical Interval Note:  04/18/2021 9:26 AM  Rhonda Higgins  has presented today for surgery, with the diagnosis of PULMONARY NODULE LEFT LOWER.  The various methods of treatment have been discussed with the patient and family. After consideration of risks, benefits and other options for treatment, the patient has consented to  Procedure(s): ROBOTIC [place] (Left) as a surgical intervention.  The patient's history has been reviewed, patient examined, no change in status, stable for surgery.  I have reviewed the patient's chart and labs.  Questions were answered to the patient's satisfaction.     Collene Gobble

## 2021-04-18 NOTE — Op Note (Signed)
Video Bronchoscopy with Robotic Assisted Bronchoscopic Navigation   Date of Operation: 04/18/2021   Pre-op Diagnosis: Left lower lobe pulmonary nodule  Post-op Diagnosis: Left lower lobe pulmonary nodule  Surgeon: Baltazar Apo  Assistants: None  Anesthesia: General endotracheal anesthesia  Operation: Flexible video fiberoptic bronchoscopy with robotic assistance and biopsies.  Estimated Blood Loss: Minimal  Complications: None  Indications and History: Rhonda Higgins is a 50 y.o. never smoker with history of slowly enlarging left lower lobe pulmonary nodule.  Recommendation was made to achieve tissue diagnosis via robotic assisted navigational bronchoscopy with biopsies. The risks, benefits, complications, treatment options and expected outcomes were discussed with the patient.  The possibilities of pneumothorax, pneumonia, reaction to medication, pulmonary aspiration, perforation of a viscus, bleeding, failure to diagnose a condition and creating a complication requiring transfusion or operation were discussed with the patient who freely signed the consent.    Description of Procedure: The patient was seen in the Preoperative Area, was examined and was deemed appropriate to proceed.  The patient was taken to [facility] endoscopy room 3, identified as Rhonda Higgins and the procedure verified as Flexible Video Fiberoptic Bronchoscopy.  A Time Out was held and the above information confirmed.   Prior to the date of the procedure a high-resolution CT scan of the chest was performed. Utilizing ION software program a virtual tracheobronchial tree was generated to allow the creation of distinct navigation pathways to the patient's parenchymal abnormalities. After being taken to the operating room general anesthesia was initiated and the patient  was orally intubated. The video fiberoptic bronchoscope was introduced via the endotracheal tube and a general inspection was performed  which showed normal right and left lung anatomy, aspiration of the bilateral mainstems was completed to remove any remaining secretions. Robotic catheter inserted into patient's endotracheal tube.   Target #1 left lower lobe pulmonary nodule: The distinct navigation pathways prepared prior to this procedure were then utilized to navigate to patient's lesion identified on CT scan. The robotic catheter was secured into place and the vision probe was withdrawn.  Lesion location was approximated using fluoroscopy and radial endobronchial ultrasound for peripheral targeting.  Local registration and targeting was then performed using Cios three-dimensional imaging.  Under fluoroscopic guidance transbronchial needle brushings, transbronchial needle biopsies, and transbronchial forceps biopsies were performed to be sent for cytology and pathology.   At the end of the procedure a general airway inspection was performed and there was no evidence of active bleeding. The bronchoscope was removed.  The patient tolerated the procedure well. There was no significant blood loss and there were no obvious complications. A post-procedural chest x-ray is pending.  Samples Target #1: 1. Transbronchial needle brushings from left lower lobe pulmonary nodule 2. Transbronchial Wang needle biopsies from left lower lobe pulmonary nodule 3. Transbronchial forceps biopsies from left lower lobe pulmonary nodule   Plans:  The patient will be discharged from the PACU to home when recovered from anesthesia and after chest x-ray is reviewed. We will review the cytology, pathology and microbiology results with the patient when they become available. Outpatient followup will be with Dr. Lamonte Sakai.    Baltazar Apo, MD, PhD 04/18/2021, 12:56 PM [facility] [phone] or if no answer before 7:00PM call [phone] For any issues after 7:00PM please call eLink [phone]

## 2021-04-18 NOTE — H&P (Signed)
Rhonda Higgins is an 50 y.o. female.   Chief Complaint: Left lower lobe pulmonary nodule HPI:  50 year old never smoker with a history of endometrial adenocarcinoma (2010-11), GERD, depression, allergic rhinitis. Has IBS and gastroparesis with a lot of GI issues, has had a lot of GI imaging.    She was found to have a 1.2 left lower lobe perihilar rounded pulmonary nodule on MRI of the abdomen done 09/30/2020.  Not seen on chest x-ray from 11/24/2020.  Prompted PET scan done 03/04/2021 as below.  PET scan performed 03/04/2021 and reviewed by me shows a 1.5 x 1.3 rounded left lower lobe pulmonary nodule.  There is only low-grade metabolic activity (SUV 4.1).  No mediastinal adenopathy or any evidence of distant disease.  Slightly increased in size based on comparison with a CT abdomen 10/16/2018   Past Medical History:  Diagnosis Date   Adenocarcinoma ([place])    endometrial, FIGO GRADE 1   Allergic rhinitis    Atypical chest pain    History of   Depression    Elevated liver enzymes    GERD (gastroesophageal reflux disease)    Hematuria    History of endometrial cancer 08-02-2009   oncologist-  dr brewster/ [facility] George and dr kinard/  no recurrence   endometrial adenocarinoma Stage 1B, Grade 1, FIGO--  s/p  TAH w/ BSO and pelvic lymph node dissection's and radiation therapy   History of kidney stones    History of radiation therapy    2011  pelvic intracavity brachytherapy treatment's for endometrial carcinoma   History of thyroid nodule    multinodular goiter s/p  total thyroidectomy 11-19-2015  per pathology -  adenomatoid nodules   Hyperlipidemia    Hypothyroidism, postsurgical    Insulin dependent diabetes mellitus    Type 2   Left ureteral stone    Obesity    OSA (obstructive sleep apnea)    severe OSA  per study 03-08-2010--  noncomplant cpap (no more sleep apnes since lost weight 2019)   Overactive bladder    Personality disorder (HCC)    Polyphagia(783.6)    PONV  (postoperative nausea and vomiting)    after ear surgery only one time   Right lower quadrant pain    Urgency of urination    UTI (urinary tract infection)     Past Surgical History:  Procedure Laterality Date   CARDIOVASCULAR STRESS TEST  06/09/2008   normal nuclear study w/ no ischemia/  normal LV function and wall motion , ef 83%   CHOLECYSTECTOMY N/A 09/06/2017   Procedure: LAPAROSCOPIC CHOLECYSTECTOMY WITH INTRAOPERATIVE CHOLANGIOGRAM;  Surgeon: Armandina Gemma, MD;  Location: [facility];  Service: General;  Laterality: N/A;   CYSTOSCOPY/RETROGRADE/URETEROSCOPY/STONE EXTRACTION WITH BASKET Left 03/08/2016   Procedure: CYSTOSCOPY/RETROGRADE/URETEROSCOPY/STONE EXTRACTION WITH BASKET, STENT PLACEMENT;  Surgeon: Rana Snare, MD;  Location: [facility];  Service: Urology;  Laterality: Left;   ENDOMETRIAL BIOPSY     ERCP N/A 09/07/2017   Procedure: ENDOSCOPIC RETROGRADE CHOLANGIOPANCREATOGRAPHY (ERCP);  Surgeon: [name], MD;  Location: [facility];  Service: Gastroenterology;  Laterality: N/A;   ESOPHAGOGASTRODUODENOSCOPY (EGD) WITH PROPOFOL N/A 03/16/2020   Procedure: ESOPHAGOGASTRODUODENOSCOPY (EGD) WITH PROPOFOL;  Surgeon: [name], MD;  Location: [facility];  Service: Gastroenterology;  Laterality: N/A;  unable to locate ampulla, aborted ERCP, changed to EGD   HOLMIUM LASER APPLICATION Left 8/75/6433   Procedure: HOLMIUM LASER APPLICATION;  Surgeon: Rana Snare, MD;  [facility]: [facility];  Service: Urology;  Laterality: Left;   MOUTH  SURGERY     MYRINGECOTMY W/ REMOVAL MIDDLE EAR CHOLESTEATOMA TYPE 1 FASICA TYMPANOPLASTY  09/13/2000   ROBOTIC ASSISTED TOTAL HYSTERECTOMY WITH BILATERAL SALPINGO OOPHERECTOMY  08-02-2009   at [facility]  dr Denman George   w/  Bilateral pelvic and para aortic lymph node dissection's   THYROIDECTOMY N/A 11/19/2015   Procedure: TOTAL THYROIDECTOMY;  Surgeon: Armandina Gemma, MD;  Location: [facility];  Service: General;  Laterality: N/A;    TONSILLECTOMY  age 1   TRANSTHORACIC ECHOCARDIOGRAM  07/19/2014   ef 55-60%/  trivial TR   TYMPANOPLASTY Right 1993    Family History  Problem Relation Age of Onset   Heart disease Sister    Heart attack Brother    Heart disease Brother    Heart disease Sister    Diabetes Sister    Breast cancer Sister    Asthma Mother    Heart disease Mother    Diabetes Mother    Emphysema Mother    Hypertension Mother    Stroke Mother    Prostate cancer Father    Social History:  reports that she has never smoked. She has never used smokeless tobacco. She reports that she does not drink alcohol and does not use drugs.  Allergies:  Allergies  Allergen Reactions   Hydrocodone Shortness Of Breath and Itching   Tape Rash    Paper tape is ok   Other Other (See Comments)    Pholcodine Unknown   Codeine Itching    Medications Prior to Admission  Medication Sig Dispense Refill   atorvastatin (LIPITOR) 40 MG tablet Take 40 mg by mouth daily.     buPROPion (WELLBUTRIN XL) 300 MG 24 hr tablet Take 300 mg by mouth every morning.     Cetirizine HCl (ZYRTEC ALLERGY) 10 MG CAPS Take 10 mg by mouth daily as needed (allergy).     gabapentin (NEURONTIN) 400 MG capsule Take 400-1,200 mg by mouth See admin instructions. Takes 400 mg in the morning 400 mg in the afternoon and 1200 mg at night     glimepiride (AMARYL) 4 MG tablet Take 4 mg by mouth 2 (two) times daily.   5   hydrOXYzine (ATARAX/VISTARIL) 25 MG tablet Take 1 tablet (25 mg total) by mouth 3 (three) times daily as needed for anxiety. 90 tablet 0   ibuprofen (ADVIL) 200 MG tablet Take 400 mg by mouth every 6 (six) hours as needed for mild pain or moderate pain. Motrin     Ibuprofen (MOTRIN PO) Take by mouth.     JANUMET 50-1000 MG tablet Take 1 tablet by mouth 2 (two) times daily.     LANTUS SOLOSTAR 100 UNIT/ML Solostar Pen Inject 55 Units into the skin 2 (two) times daily.     liraglutide (VICTOZA) 18 MG/3ML SOPN Inject 1.8 mg into the  skin daily.     lisinopril (PRINIVIL,ZESTRIL) 5 MG tablet Take 5 mg by mouth daily.     metoCLOPramide (REGLAN) 5 MG tablet Take 5 mg by mouth 3 (three) times daily as needed for pain.     ondansetron (ZOFRAN-ODT) 4 MG disintegrating tablet Take 4 mg by mouth 2 (two) times daily as needed for nausea/vomiting.     pantoprazole (PROTONIX) 40 MG tablet Take 40 mg by mouth 2 (two) times daily.   0   risperiDONE (RISPERDAL) 2 MG tablet Take 2 mg by mouth at bedtime.   2   sucralfate (CARAFATE) 1 g tablet Take 1 g by mouth 3 (three) times daily.  SYNTHROID 175 MCG tablet Take 175 mcg by mouth daily.     traZODone (DESYREL) 50 MG tablet Take 1 tablet (50 mg total) by mouth at bedtime as needed for sleep. (Patient taking differently: Take 50 mg by mouth at bedtime.) 30 tablet 0   Vitamin D, Cholecalciferol, 25 MCG (1000 UT) TABS Take 1,000 Units by mouth daily.     ACCU-CHEK GUIDE test strip      Accu-Chek Softclix Lancets lancets daily.     ciclopirox (PENLAC) 8 % solution Apply one coat to affected toenails once daily for 48 weeks. Remove once weekly with nail polish remover. 6.6 mL 11   linagliptin (TRADJENTA) 5 MG TABS tablet Take 1 tablet (5 mg total) by mouth daily with breakfast. (Patient not taking: Reported on 04/14/2021) 30 tablet    metFORMIN (GLUCOPHAGE) 1000 MG tablet Take 1 tablet (1,000 mg total) by mouth 2 (two) times daily with a meal. (Patient not taking: Reported on 04/14/2021)     naproxen (NAPROSYN) 375 MG tablet Take 1 tablet (375 mg total) by mouth 2 (two) times daily. (Patient not taking: No sig reported) 20 tablet 0   polyethylene glycol-electrolytes (NULYTELY) 420 g solution Take 4,000 mLs by mouth once.     traMADol (ULTRAM) 50 MG tablet Take 1 tablet (50 mg total) by mouth every 12 (twelve) hours as needed. (Patient not taking: No sig reported) 30 tablet 0    No results found for this or any previous visit (from the past 48 hour(s)). No results found.  Review of  Systems Doing well. No new issues.  No cough, dyspnea, CP.   Blood pressure (!) 140/43, pulse 94, temperature 98.5 F (36.9 C), temperature source Oral, resp. rate 18, height 5\' 3"  (1.6 m), weight 105.2 kg, SpO2 96 %. Physical Exam  Gen: Pleasant, overwt woman, in no distress,  normal affect  ENT: No lesions,  mouth clear,  oropharynx clear, no postnasal drip  Neck: No JVD, no stridor  Lungs: No use of accessory muscles, no crackles or wheezing on normal respiration, no wheeze on forced expiration  Cardiovascular: RRR, heart sounds normal, no murmur or gallops, no peripheral edema  Abdomen: soft and NT, no HSM,  BS normal  Musculoskeletal: No deformities, no cyanosis or clubbing  Neuro: alert, awake, non focal  Skin: Warm, no lesions or rashes   Assessment/Plan Left lower lobe pulmonary nodule that can be identified on serial films back to 2016.  Slowly enlarging, round, minimally hypermetabolic on PET.  All consistent with a carcinoid tumor.  She presents today for navigational bronchoscopy, tissue diagnosis..  Risks and benefits of the procedure discussed.  All questions answered.  She understands and wishes to proceed.  Collene Gobble, MD 04/18/2021, 9:26 AM

## 2021-04-18 NOTE — Transfer of Care (Signed)
Immediate Anesthesia Transfer of Care Note  Patient: Rhonda Higgins  Procedure(s) Performed: ROBOTIC ASSISTED BRONCHOSCOPY WITH ENDOBRONCHIAL NAVIGATION (Left) BRONCHIAL BIOPSIES BRONCHIAL NEEDLE ASPIRATION BIOPSIES BRONCHIAL BRUSHINGS RADIAL ENDOBRONCHIAL ULTRASOUND  Patient Location: PACU  Anesthesia Type:General  Level of Consciousness: awake  Airway & Oxygen Therapy: Patient Spontanous Breathing and non-rebreather face mask  Post-op Assessment: Report given to RN and Post -op Vital signs reviewed and stable  Post vital signs: Reviewed and stable  Last Vitals:  Vitals Value Taken Time  BP 109/50 04/18/21 1326  Temp    Pulse 110 04/18/21 1331  Resp 23 04/18/21 1331  SpO2 98 % 04/18/21 1331  Vitals shown include unvalidated device data.  Last Pain:  Vitals:   04/18/21 0936  TempSrc:   PainSc: 0-No pain         Complications: No notable events documented.

## 2021-04-18 NOTE — Discharge Instructions (Signed)
Flexible Bronchoscopy, Care After This sheet gives you information about how to care for yourself after your test. Your doctor may also give you more specific instructions. If you have problems or questions, contact your doctor. Follow these instructions at home: Eating and drinking Do not eat or drink anything (not even water) for 2 hours after your test, or until your numbing medicine (local anesthetic) wears off. When your numbness is gone and your cough and gag reflexes have come back, you may: Eat only soft foods. Slowly drink liquids. The day after the test, go back to your normal diet. Driving Do not drive for 24 hours if you were given a medicine to help you relax (sedative). Do not drive or use heavy machinery while taking prescription pain medicine. General instructions  Take over-the-counter and prescription medicines only as told by your doctor. Return to your normal activities as told. Ask what activities are safe for you. Do not use any products that have nicotine or tobacco in them. This includes cigarettes and e-cigarettes. If you need help quitting, ask your doctor. Keep all follow-up visits as told by your doctor. This is important. It is very important if you had a tissue sample (biopsy) taken. Get help right away if: You have shortness of breath that gets worse. You get light-headed. You feel like you are going to pass out (faint). You have chest pain. You cough up: More than a little blood. More blood than before. Summary Do not eat or drink anything (not even water) for 2 hours after your test, or until your numbing medicine wears off. Do not use cigarettes. Do not use e-cigarettes. Get help right away if you have chest pain.  Please call our office for any questions or concerns.  [phone].   This information is not intended to replace advice given to you by your health care provider. Make sure you discuss any questions you have with your health care  provider. Document Released: 04/02/2009 Document Revised: 05/18/2017 Document Reviewed: 06/23/2016 Elsevier Patient Education  2020 [employer].

## 2021-04-19 ENCOUNTER — Ambulatory Visit
Admission: RE | Admit: 2021-04-19 | Discharge: 2021-04-19 | Disposition: A | Discharge status: Home / Self Care | Payer: Medicaid Other | Source: Ambulatory Visit | Attending: Gastroenterology | Admitting: Gastroenterology

## 2021-04-19 ENCOUNTER — Telehealth: Payer: Self-pay | Admitting: Emergency Medicine

## 2021-04-19 DIAGNOSIS — K7469 Other cirrhosis of liver: Secondary | ICD-10-CM

## 2021-04-19 IMAGING — US US ABDOMEN LIMITED
1 series · 14 of 25 positions shown · non-contrast
Comparison: MRI abdomen [DATE].

CLINICAL DATA: Cirrhosis of liver.

EXAM:
ULTRASOUND ABDOMEN LIMITED RIGHT UPPER QUADRANT

[Series 1: us abdomen limited · 0.30mm/px · 14 of 34 slices shown]
[im 1/34]
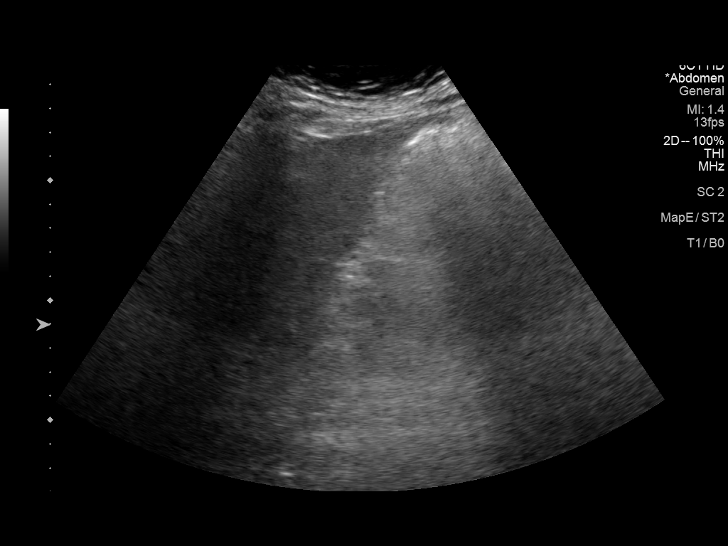
[im 3/34]
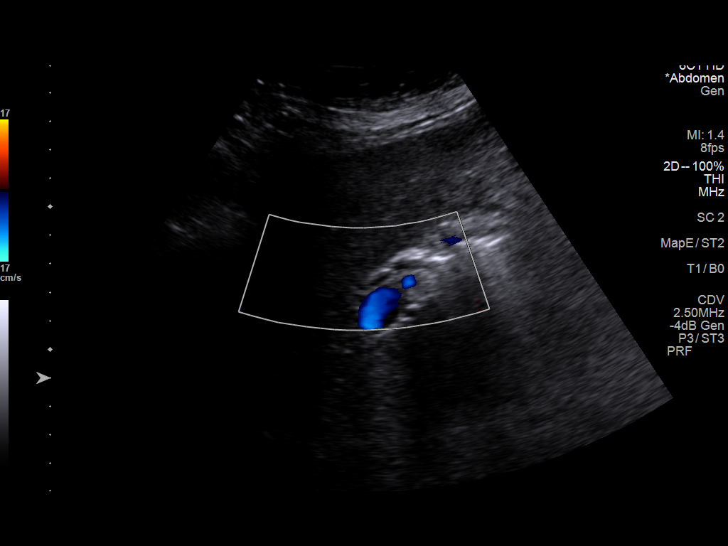
[im 6/34]
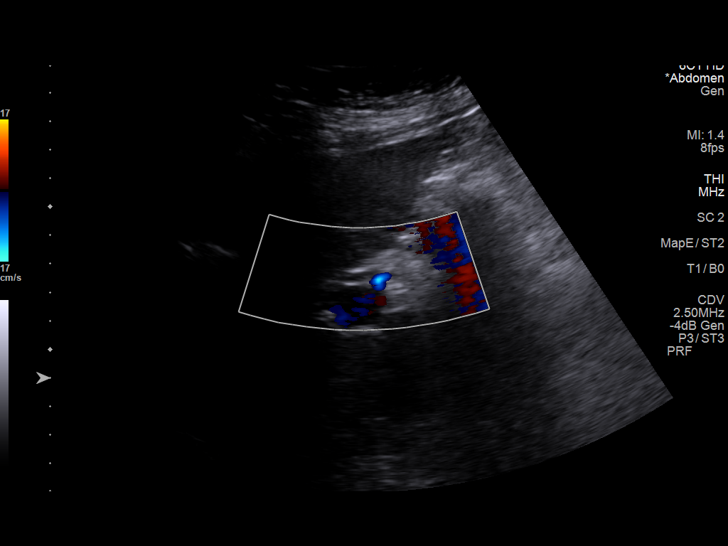
[im 9/34]
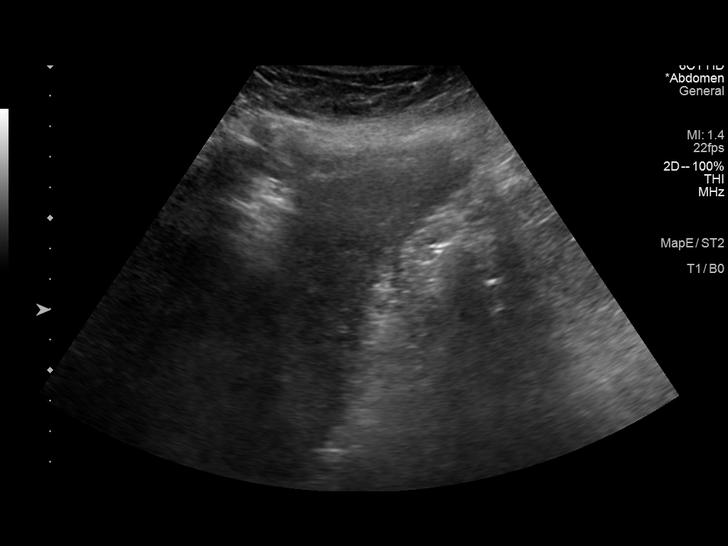
[im 12/34]
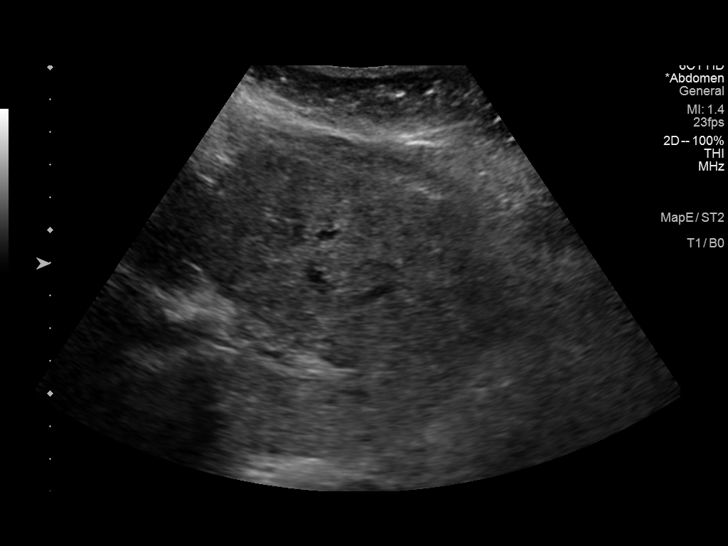
[im 13/34]
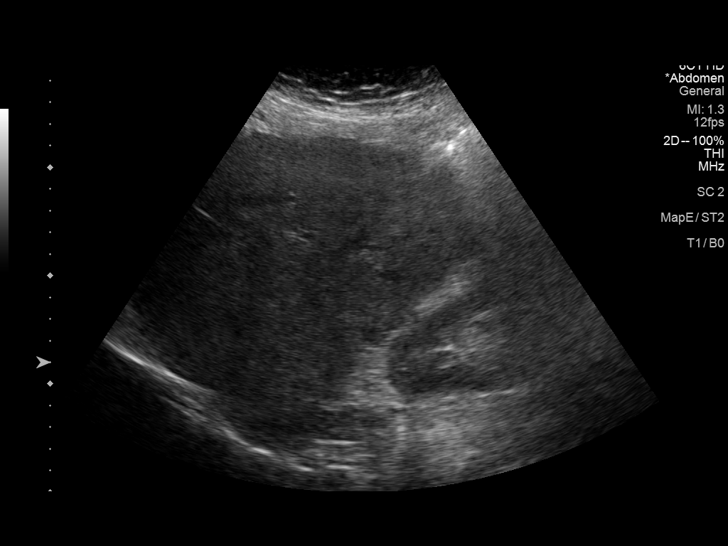
[im 16/34]
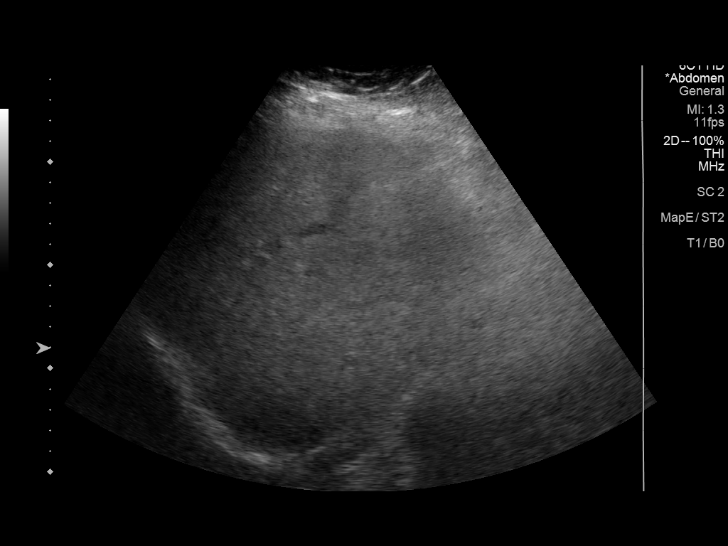
[im 18/34]
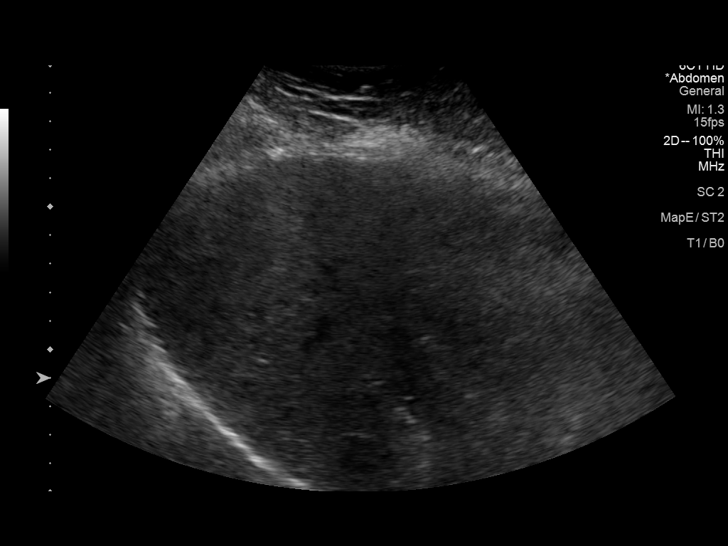
[im 21/34]
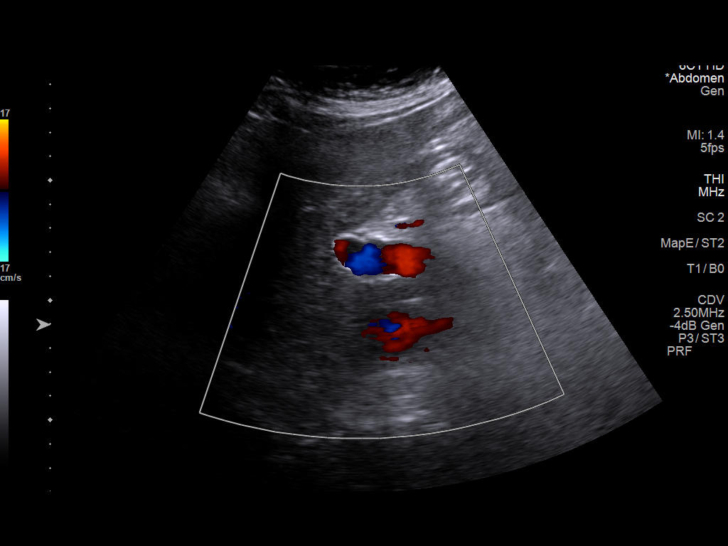
[im 23/34]
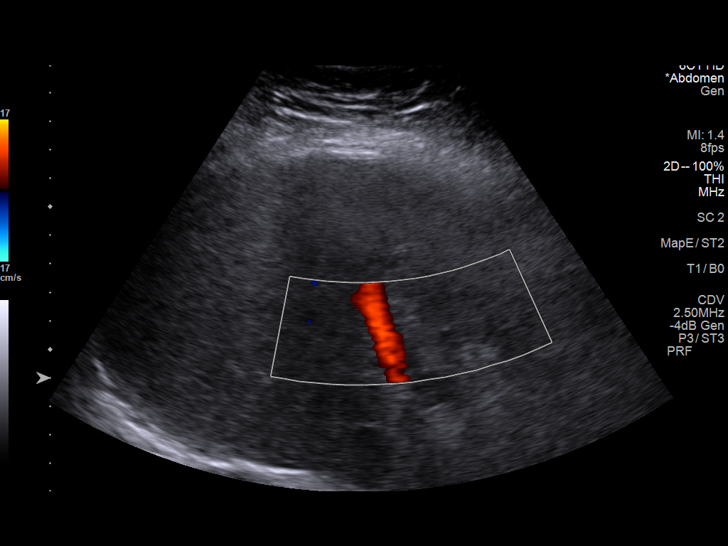
[im 25/34]
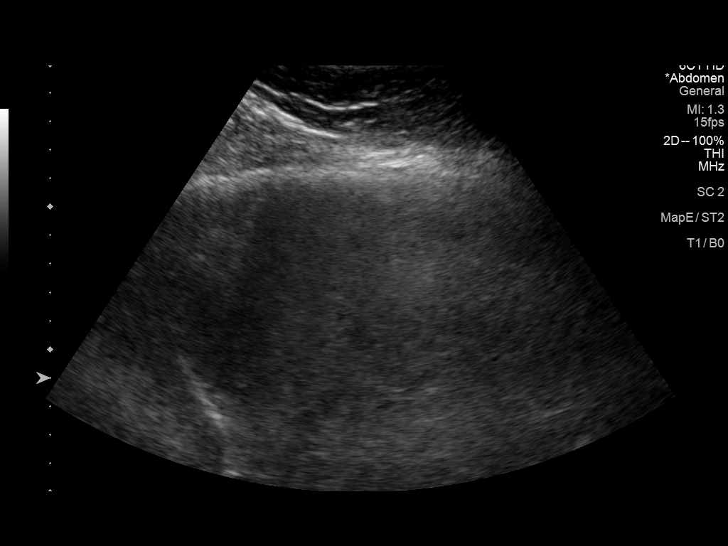
[im 28/34]
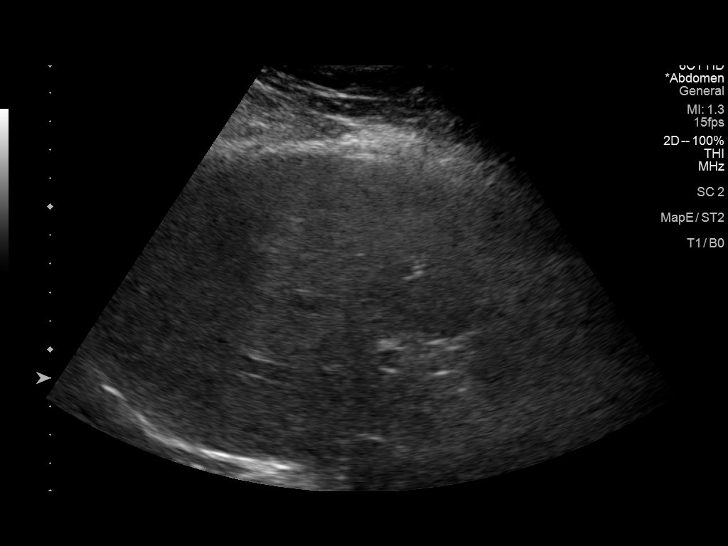
[im 31/34]
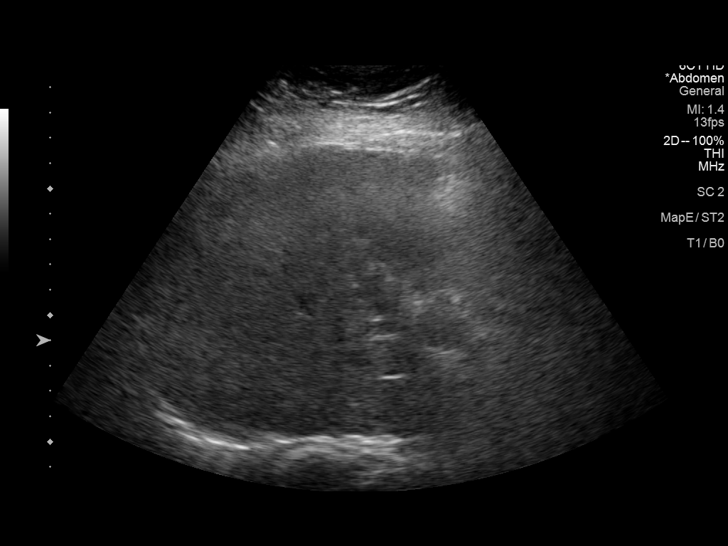
[im 34/34]
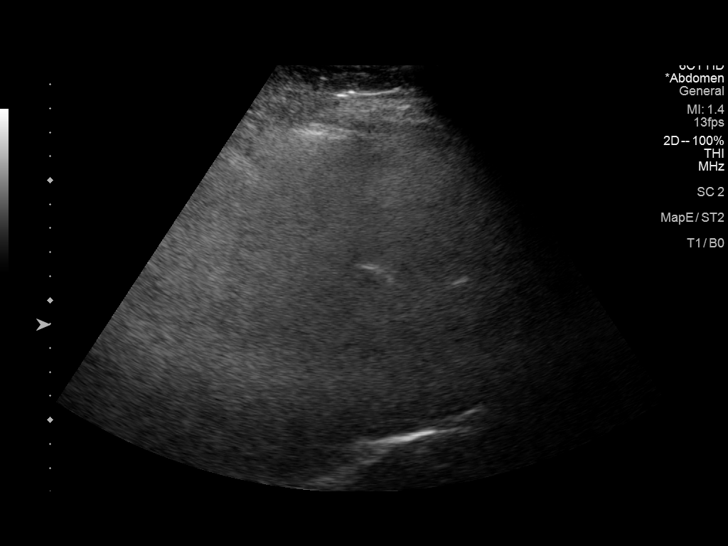

[14 of 25 positions shown; findings below may reference images not displayed]

FINDINGS: Gallbladder:

Absent.

Common bile duct:

Diameter: 4 mm.

Liver:

No focal lesion identified. Parenchyma is diffusely heterogeneous
with increased echotexture. Portal vein is patent on color Doppler
imaging with normal direction of blood flow towards the liver.

Other: None.
IMPRESSION: 1. Heterogeneous liver with increased echotexture likely related to
patient's cirrhosis. No focal mass.
2. Cholecystectomy.

## 2021-04-19 NOTE — Telephone Encounter (Signed)
Discussed with her - it appears to be erythematous, splotchy, pruritic. Limited to the lower arm. She did not have an IV or tape on that side, but it sounds like a contact dermatitis. Will try cortisone cream, benadryl. Asked her to call me if she sees a more generalized rash.

## 2021-04-19 NOTE — Telephone Encounter (Signed)
Spoke with the pt  She had bronch done 04/18/21  She has started to notice rash on her right arm just since waking up today  She states it's "splotches" on her right elbow down to her fingers, slightly itchy but not painful or raised  She states not having any swelling, increased SOB, trouble swallowing or other symptoms  She has not taken anything for this yet bc she wants to know what Dr Lamonte Sakai recommends  Please advise thanks

## 2021-04-20 ENCOUNTER — Ambulatory Visit (INDEPENDENT_AMBULATORY_CARE_PROVIDER_SITE_OTHER): Payer: Medicaid Other | Admitting: Emergency Medicine

## 2021-04-20 ENCOUNTER — Other Ambulatory Visit: Payer: Self-pay

## 2021-04-20 ENCOUNTER — Encounter (HOSPITAL_COMMUNITY): Payer: Self-pay | Admitting: Emergency Medicine

## 2021-04-20 DIAGNOSIS — Z9889 Other specified postprocedural states: Secondary | ICD-10-CM

## 2021-04-20 DIAGNOSIS — R911 Solitary pulmonary nodule: Secondary | ICD-10-CM

## 2021-04-20 NOTE — Assessment & Plan Note (Signed)
Most consistent with a carcinoid tumor.  Cytology and pathology are still pending.  Assuming this is the result then we will need to follow with serial films to assess its growth pattern.  If any other pathology then we will make appropriate referrals for either treatment or resection.

## 2021-04-20 NOTE — Patient Instructions (Addendum)
Dr. Lamonte Sakai will call you with your cytology and pathology results as soon as they are available. If your pulmonary nodule is a carcinoid tumor then we will plan to repeat your CT scan of the chest in October 2023 Please call our office if you have any changes in your breathing, increased cough or increase sputum.  Also call if you have any increase in blood in your sputum. We will plan the timing of your follow-up depending on your upcoming pathology results.

## 2021-04-20 NOTE — Progress Notes (Signed)
[facility]  Subjective:    Patient ID: Rhonda Higgins, female    DOB: 26-Sep-1970, 50 y.o.   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 Has IBS and gastroparesis with a lot of GI issues, has had a lot of GI imaging.   She was found to have a 1.2 left lower lobe perihilar rounded pulmonary nodule on MRI of the abdomen done 09/30/2020.  Not seen on chest x-ray from 11/24/2020.  Prompted PET scan done 03/04/2021 as below.  PET scan performed 03/04/2021 and reviewed by me shows a 1.5 x 1.3 rounded left lower lobe pulmonary nodule.  There is only low-grade metabolic activity (SUV 4.1).  No mediastinal adenopathy or any evidence of distant disease.  Slightly increased in size based on comparison with a CT abdomen 10/16/2018   ROV 04/20/21 --50 year old woman who follows up today to discuss her cytology results from recent bronchoscopy 10/31.  This was to evaluate a 1.5 cm rounded left lower lobe pulmonary nodule.  She still having some hemoptysis with old blood.  No fever, no purulent sputum.  She has some dyspnea that is not her usual baseline.  Pathology and cytology results are still pending.  She had a mild erythematous rash on her right arm post procedure    Review of Systems As per HPI  Past Medical History:  Diagnosis Date   Adenocarcinoma ([place])    endometrial, FIGO GRADE 1   Allergic rhinitis    Atypical chest pain    History of   Depression    Elevated liver enzymes    GERD (gastroesophageal reflux disease)    Hematuria    History of endometrial cancer 08-02-2009   oncologist-  dr brewster/ [facility] and dr kinard/  no recurrence   endometrial adenocarinoma Stage 1B, Grade 1, FIGO--  s/p  TAH w/ BSO and pelvic lymph node dissection's and radiation therapy   History of kidney stones    History of radiation therapy    2011  pelvic intracavity brachytherapy treatment's for endometrial  carcinoma   History of thyroid nodule    multinodular goiter s/p  total thyroidectomy 11-19-2015  per pathology -  adenomatoid nodules   Hyperlipidemia    Hypothyroidism, postsurgical    Insulin dependent diabetes mellitus    Type 2   Left ureteral stone    Obesity    OSA (obstructive sleep apnea)    severe OSA  per study 03-08-2010--  noncomplant cpap (no more sleep apnes since lost weight 2019)   Overactive bladder    Personality disorder (HCC)    Polyphagia(783.6)    PONV (postoperative nausea and vomiting)    after ear surgery only one time   Right lower quadrant pain    Urgency of urination    UTI (urinary tract infection)      Family History  Problem Relation Age of Onset   Heart disease Sister    Heart attack Brother    Heart disease Brother    Heart disease Sister    Diabetes Sister    Breast cancer Sister    Asthma Mother    Heart disease Mother    Diabetes Mother    Emphysema Mother    Hypertension Mother    Stroke Mother    Prostate cancer Father     No hx lung CA in the family.   Social History   Socioeconomic History   Marital status: Married  Spouse name: Not on file   Number of children: N   Years of education: 10   Highest education level: 10th grade  Occupational History   Occupation: n/a  Tobacco Use   Smoking status: Never   Smokeless tobacco: Never  Vaping Use   Vaping Use: Never used  Substance and Sexual Activity   Alcohol use: No   Drug use: No   Sexual activity: Yes    Birth control/protection: None  Other Topics Concern   Not on file  Social History Narrative   Lives with partner, [name].   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     atorvastatin (LIPITOR) 40 MG tablet Take 40 mg by mouth daily.     buPROPion (WELLBUTRIN XL) 300 MG 24 hr tablet Take 300 mg by mouth every morning.     Cetirizine HCl (ZYRTEC ALLERGY) 10 MG CAPS Take 10 mg by mouth daily as needed (allergy).     ciclopirox (PENLAC) 8 % solution Apply one coat to affected toenails once daily for 48 weeks. Remove once weekly with nail polish remover. 6.6 mL 11   ferrous sulfate 325 (65 FE) MG EC tablet Take 325 mg by mouth daily. Once a day     gabapentin (NEURONTIN) 400 MG capsule Take 400-1,200 mg by mouth See admin instructions. Takes 400 mg in the morning 400 mg in the afternoon and 1200 mg at night     glimepiride (AMARYL) 4 MG tablet Take 4 mg by mouth 2 (two) times daily.   5   hydrOXYzine (ATARAX/VISTARIL) 25 MG tablet Take 1 tablet (25 mg total) by mouth 3 (three) times daily as needed for anxiety. 90 tablet 0   ibuprofen (ADVIL) 200 MG tablet Take 400 mg by mouth every 6 (six) hours as needed for mild pain or moderate pain. Motrin     Ibuprofen (MOTRIN PO) Take by mouth.     JANUMET 50-1000 MG tablet Take 1 tablet by mouth 2 (two) times daily.     LANTUS SOLOSTAR 100 UNIT/ML Solostar Pen Inject 55 Units into the skin 2 (two) times daily.     liraglutide (VICTOZA) 18 MG/3ML SOPN Inject 1.8 mg into the skin daily.     lisinopril (PRINIVIL,ZESTRIL) 5 MG tablet Take 5 mg by mouth daily.     metoCLOPramide (REGLAN) 5 MG tablet Take 5 mg by mouth 3 (three) times daily as needed for pain.     ondansetron (ZOFRAN-ODT) 4 MG disintegrating tablet Take 4 mg by mouth 2 (two) times daily as needed for nausea/vomiting.     pantoprazole  (PROTONIX) 40 MG tablet Take 40 mg by mouth 2 (two) times daily.   0   polyethylene glycol-electrolytes (NULYTELY) 420 g solution Take 4,000 mLs by mouth once.  risperiDONE (RISPERDAL) 2 MG tablet Take 2 mg by mouth at bedtime.   2   sucralfate (CARAFATE) 1 g tablet Take 1 g by mouth 3 (three) times daily.     SYNTHROID 175 MCG tablet Take 175 mcg by mouth daily.     traZODone (DESYREL) 50 MG tablet Take 1 tablet (50 mg total) by mouth at bedtime.     Vitamin D, Cholecalciferol, 25 MCG (1000 UT) TABS Take 1,000 Units by mouth daily.     linagliptin (TRADJENTA) 5 MG TABS tablet Take 1 tablet (5 mg total) by mouth daily with breakfast. (Patient not taking: No sig reported) 30 tablet    metFORMIN (GLUCOPHAGE) 1000 MG tablet Take 1 tablet (1,000 mg total) by mouth 2 (two) times daily with a meal. (Patient not taking: No sig reported)     No facility-administered medications prior to visit.         Objective:   Physical Exam  Vitals:   04/20/21 1629  BP: 116/70  Pulse: 71  Temp: 98.6 F (37 C)  TempSrc: Oral  SpO2: 97%  Weight: 232 lb 9.6 oz (105.5 kg)  Height: 5\' 3"  (1.6 m)   Gen: Pleasant, overwt woman, in no distress,  normal affect  ENT: No lesions,  mouth clear,  oropharynx clear, no postnasal drip  Neck: No JVD, no stridor  Lungs: No use of accessory muscles, no crackles or wheezing on normal respiration, no wheeze on forced expiration  Cardiovascular: RRR, heart sounds normal, no murmur or gallops, no peripheral edema  Musculoskeletal: No deformities, no cyanosis or clubbing  Neuro: alert, awake, non focal  Skin: Warm, very faint pinpoint papular erythematous rash on the right forearm     Assessment & Plan:  Pulmonary nodule 1 cm or greater in diameter Most consistent with a carcinoid tumor.  Cytology and pathology are still pending.  Assuming this is the result then we will need to follow with serial films to assess its growth pattern.  If any other pathology  then we will make appropriate referrals for either treatment or resection.  Status post bronchoscopy Still with some hemoptysis, old blood mixed with her sputum.  We will continue to follow.  If any evidence for increase will further evaluate.  Also will follow for any evidence of bronchitis or pneumonia, perform chest x-ray and consider antibiotics if she develops signs or symptoms.   Baltazar Apo, MD, PhD 04/20/2021, 5:11 PM [facility] [phone] or if no answer before 7:00PM call [phone] For any issues after 7:00PM please call eLink [phone]

## 2021-04-20 NOTE — Assessment & Plan Note (Signed)
Still with some hemoptysis, old blood mixed with her sputum.  We will continue to follow.  If any evidence for increase will further evaluate.  Also will follow for any evidence of bronchitis or pneumonia, perform chest x-ray and consider antibiotics if she develops signs or symptoms.

## 2021-04-21 ENCOUNTER — Telehealth: Payer: Self-pay | Admitting: Emergency Medicine

## 2021-04-21 LAB — CYTOLOGY - NON PAP

## 2021-04-21 NOTE — Telephone Encounter (Signed)
Discussed biopsy results w the patient. Shows a neuroendocrine tumor, consistent with a typical carcinoid tumor. We will follow it with CT chest to assess growth.

## 2021-04-30 ENCOUNTER — Encounter (HOSPITAL_COMMUNITY): Payer: Self-pay

## 2021-04-30 ENCOUNTER — Emergency Department (HOSPITAL_COMMUNITY): Payer: Medicaid Other

## 2021-04-30 ENCOUNTER — Emergency Department (HOSPITAL_COMMUNITY)
Admission: EM | Admit: 2021-04-30 | Discharge: 2021-04-30 | Disposition: A | Discharge status: Home / Self Care | Payer: Medicaid Other | Attending: Emergency Medicine | Admitting: Emergency Medicine

## 2021-04-30 DIAGNOSIS — E119 Type 2 diabetes mellitus without complications: Secondary | ICD-10-CM | POA: Diagnosis not present

## 2021-04-30 DIAGNOSIS — Z7984 Long term (current) use of oral hypoglycemic drugs: Secondary | ICD-10-CM | POA: Diagnosis not present

## 2021-04-30 DIAGNOSIS — Z79899 Other long term (current) drug therapy: Secondary | ICD-10-CM | POA: Diagnosis not present

## 2021-04-30 DIAGNOSIS — R0602 Shortness of breath: Secondary | ICD-10-CM | POA: Diagnosis not present

## 2021-04-30 DIAGNOSIS — R079 Chest pain, unspecified: Secondary | ICD-10-CM | POA: Insufficient documentation

## 2021-04-30 DIAGNOSIS — Z794 Long term (current) use of insulin: Secondary | ICD-10-CM | POA: Insufficient documentation

## 2021-04-30 DIAGNOSIS — E039 Hypothyroidism, unspecified: Secondary | ICD-10-CM | POA: Insufficient documentation

## 2021-04-30 DIAGNOSIS — R059 Cough, unspecified: Secondary | ICD-10-CM | POA: Insufficient documentation

## 2021-04-30 DIAGNOSIS — Z8542 Personal history of malignant neoplasm of other parts of uterus: Secondary | ICD-10-CM | POA: Diagnosis not present

## 2021-04-30 LAB — BASIC METABOLIC PANEL
Anion gap: 7 (ref 5–15)
BUN: 6 mg/dL (ref 6–20)
CO2: 24 mmol/L (ref 22–32)
Calcium: 8.1 mg/dL — ABNORMAL LOW (ref 8.9–10.3)
Chloride: 106 mmol/L (ref 98–111)
Creatinine, Ser: 0.5 mg/dL (ref 0.44–1.00)
GFR, Estimated: >60 mL/min (ref >60)
Glucose, Bld: 247 mg/dL — ABNORMAL HIGH (ref 70–99)
Potassium: 3.4 mmol/L — ABNORMAL LOW (ref 3.5–5.1)
Sodium: 137 mmol/L (ref 135–145)

## 2021-04-30 LAB — CBC WITH DIFFERENTIAL/PLATELET
Abs Immature Granulocytes: 0.01 10*3/uL (ref 0.00–0.07)
Basophils Absolute: 0 10*3/uL (ref 0.0–0.1)
Basophils Relative: 1 %
Eosinophils Absolute: 0.1 10*3/uL (ref 0.0–0.5)
Eosinophils Relative: 2 %
HCT: 40.6 % (ref 36.0–46.0)
Hemoglobin: 12.9 g/dL (ref 12.0–15.0)
Immature Granulocytes: 0 %
Lymphocytes Relative: 28 %
Lymphs Abs: 0.8 10*3/uL (ref 0.7–4.0)
MCH: 25.3 pg — ABNORMAL LOW (ref 26.0–34.0)
MCHC: 31.8 g/dL (ref 30.0–36.0)
MCV: 79.8 fL — ABNORMAL LOW (ref 80.0–100.0)
Monocytes Absolute: 0.2 10*3/uL (ref 0.1–1.0)
Monocytes Relative: 7 %
Neutro Abs: 1.8 10*3/uL (ref 1.7–7.7)
Neutrophils Relative %: 62 %
Platelets: 87 10*3/uL — ABNORMAL LOW (ref 150–400)
RBC: 5.09 MIL/uL (ref 3.87–5.11)
RDW: 18.7 % — ABNORMAL HIGH (ref 11.5–15.5)
WBC: 2.9 10*3/uL — ABNORMAL LOW (ref 4.0–10.5)
nRBC: 0 % (ref 0.0–0.2)

## 2021-04-30 LAB — TROPONIN I (HIGH SENSITIVITY)
Troponin I (High Sensitivity): 2 ng/L (ref <18)
Troponin I (High Sensitivity): 2 ng/L (ref <18)

## 2021-04-30 IMAGING — CR DG CHEST 2V
2 series · 2 of 2 positions shown · non-contrast
Comparison: CT chest [DATE].

CLINICAL DATA: Shortness of breath and cough. Lung biopsy and
[DATE].

EXAM:
CHEST - 2 VIEW

[w chest pa]
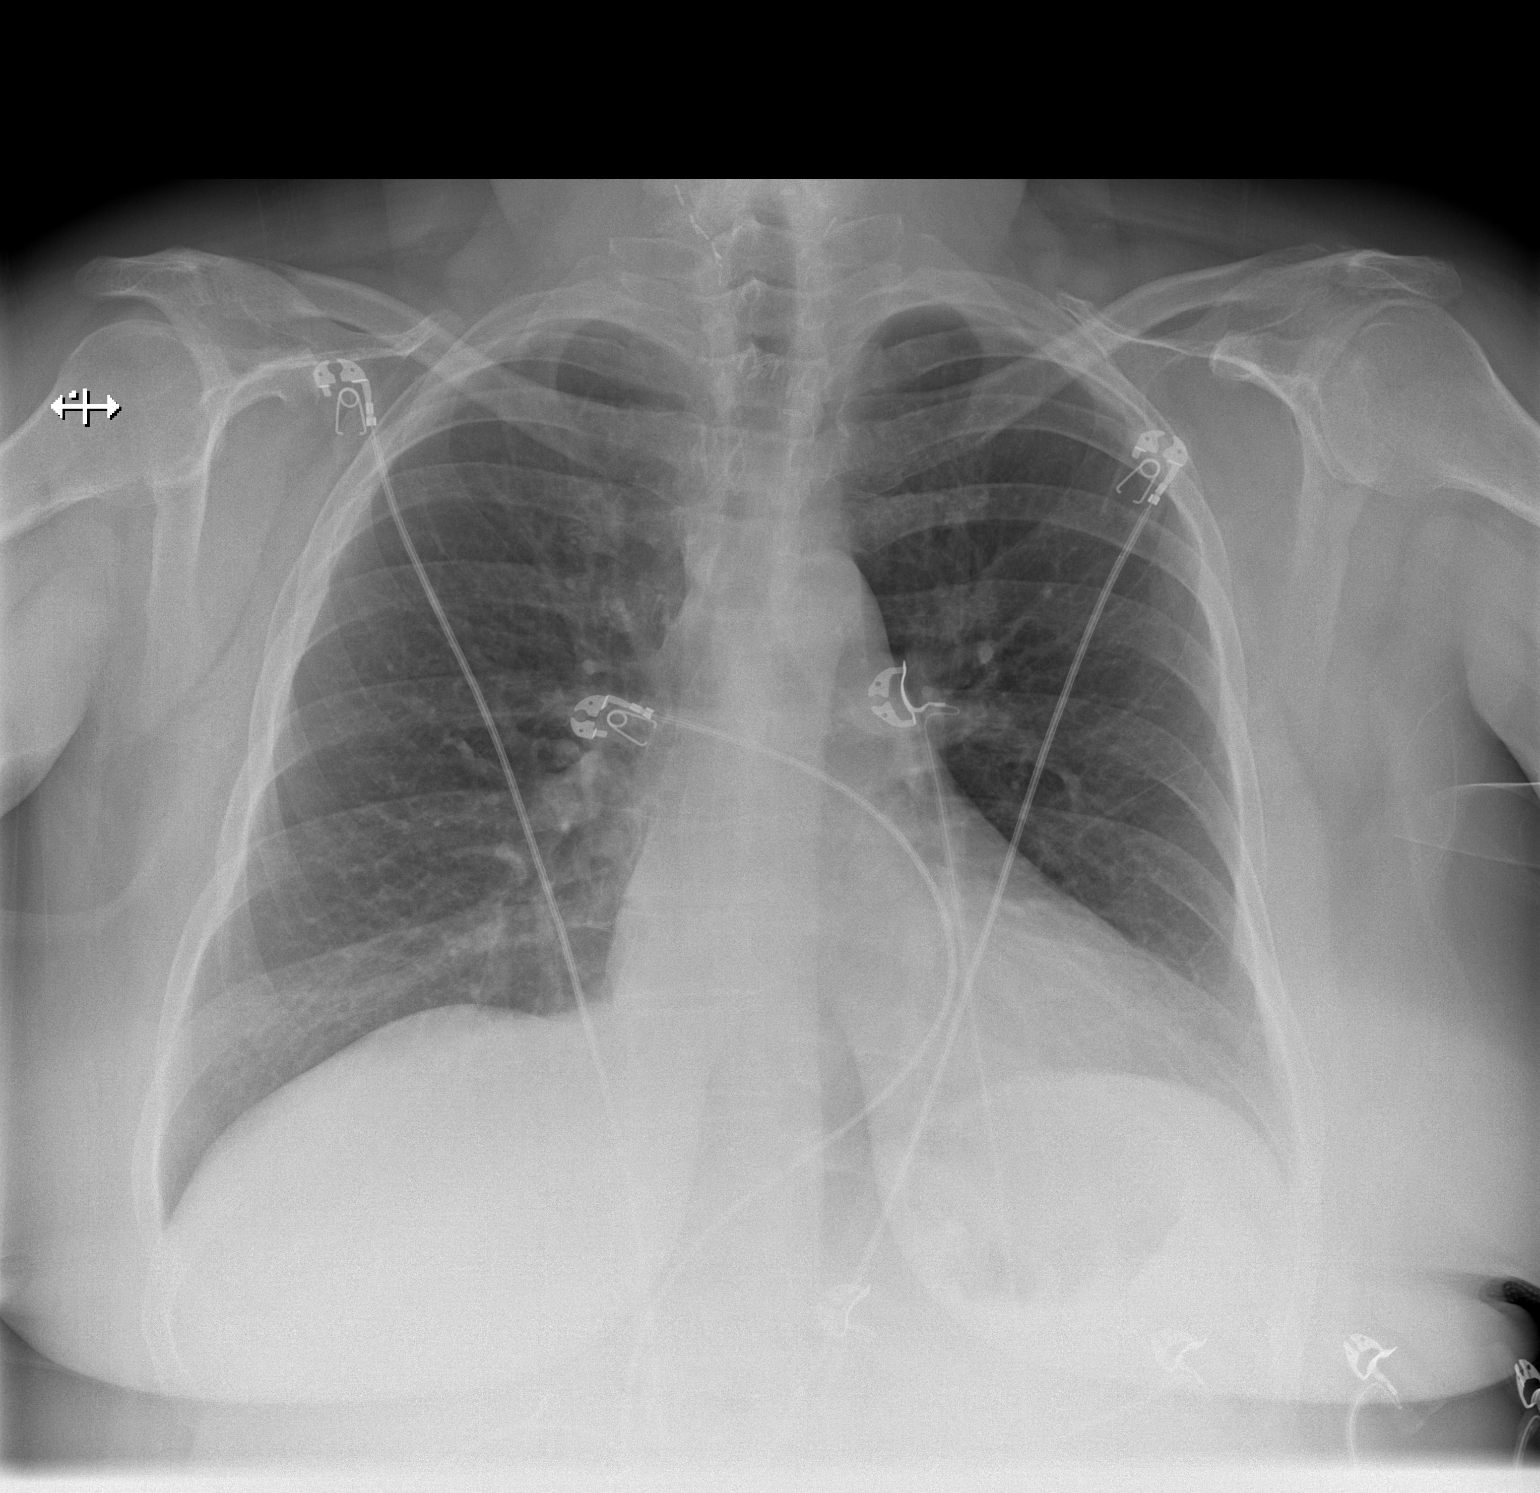

[w chest lat]
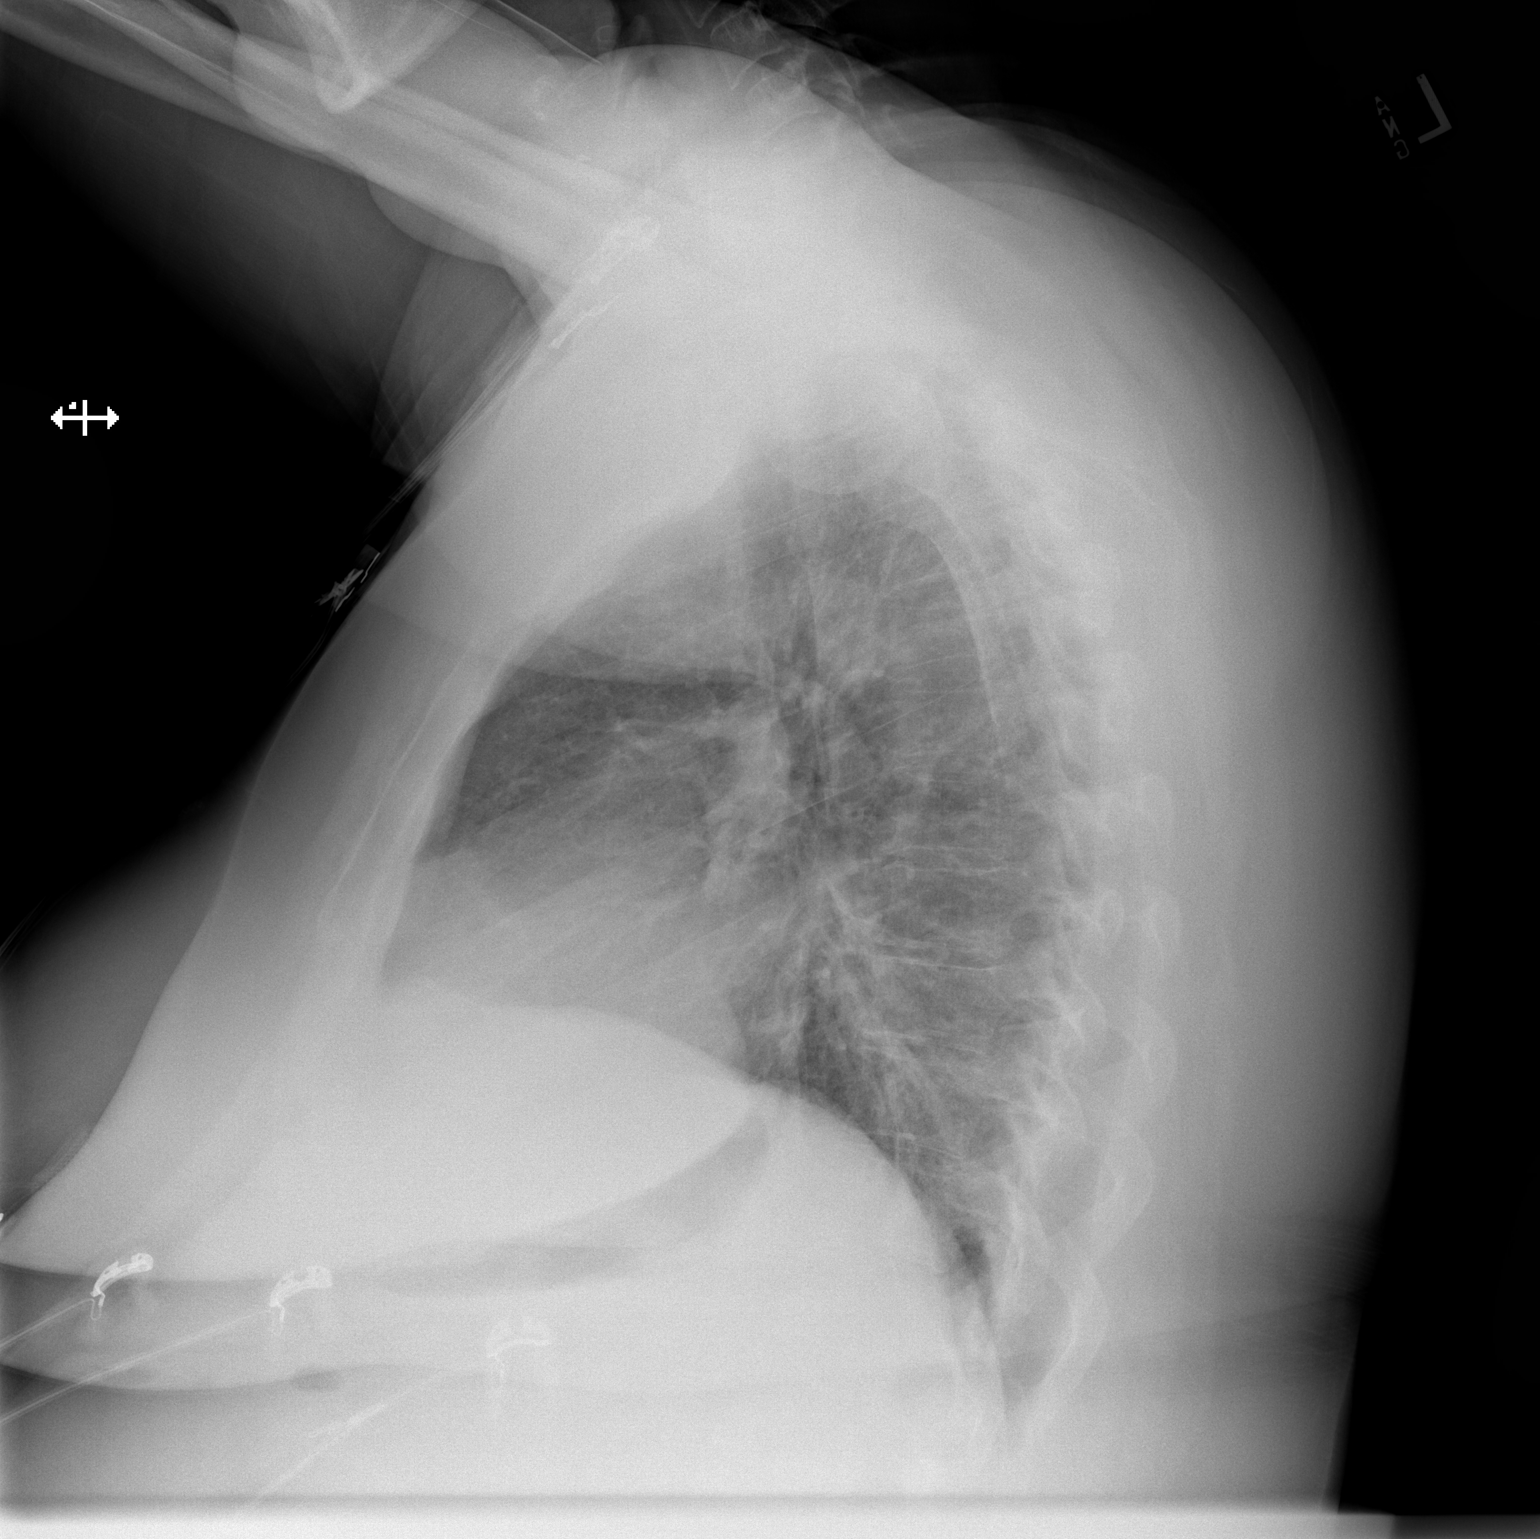

[2 of 2 positions shown; findings below may reference images not displayed]

FINDINGS: The heart size and mediastinal contours are within normal limits.
Both lungs are clear. The visualized skeletal structures are
unremarkable. Surgical clips overlie the lower neck.
IMPRESSION: No active cardiopulmonary disease.

## 2021-04-30 NOTE — ED Provider Notes (Signed)
[facility] DEPT Provider Note   CSN: [id number] Arrival date & time: 04/30/21  1406     History Chief Complaint  Patient presents with   Chest Pain   Cough   Shortness of Breath    Rhonda Higgins is a 50 y.o. female.  HPI Patient presents for evaluation of chest pain with shortness of breath for several days.  She recently had a pulmonary nodule biopsy done.  Per report in EMR, it showed a carcinoid tumor, and plans were made for follow-up with chest CT to assess growth.  She has had cough, nonproductive, since the biopsy done a couple weeks ago.  She is not producing sputum.  She denies fever, chills, nausea or vomiting.  She does not have asthma and is not a smoker.  She is not taking anything for the discomfort.  There are no other known active modifying factors.    Past Medical History:  Diagnosis Date   Adenocarcinoma ([place])    endometrial, FIGO GRADE 1   Allergic rhinitis    Atypical chest pain    History of   Depression    Elevated liver enzymes    GERD (gastroesophageal reflux disease)    Hematuria    History of endometrial cancer 08-02-2009   oncologist-  dr brewster/ [place] George and dr kinard/  no recurrence   endometrial adenocarinoma Stage 1B, Grade 1, FIGO--  s/p  TAH w/ BSO and pelvic lymph node dissection's and radiation therapy   History of kidney stones    History of radiation therapy    2011  pelvic intracavity brachytherapy treatment's for endometrial carcinoma   History of thyroid nodule    multinodular goiter s/p  total thyroidectomy 11-19-2015  per pathology -  adenomatoid nodules   Hyperlipidemia    Hypothyroidism, postsurgical    Insulin dependent diabetes mellitus    Type 2   Left ureteral stone    Obesity    OSA (obstructive sleep apnea)    severe OSA  per study 03-08-2010--  noncomplant cpap (no more sleep apnes since lost weight 2019)   Overactive bladder    Personality disorder (HCC)     Polyphagia(783.6)    PONV (postoperative nausea and vomiting)    after ear surgery only one time   Right lower quadrant pain    Urgency of urination    UTI (urinary tract infection)     Patient Active Problem List   Diagnosis Date Noted   Status post bronchoscopy 04/20/2021   Pulmonary nodule 1 cm or greater in diameter 03/17/2021   Elevated levels of transaminase & lactic acid dehydrogenase 08/03/2020   Epigastric pain 08/03/2020   Liver nodule 08/03/2020   Obstruction of bile duct 08/03/2020   Cirrhosis of liver ([place]) 07/29/2020   Gastroparesis 07/29/2020   Irritable bowel syndrome 07/29/2020   Steatosis of liver 07/29/2020   MDD (major depressive disorder), recurrent episode, severe ([place]) 04/01/2020   Borderline personality disorder ([place]) 04/01/2020   Intermittent explosive disorder in adult 04/01/2020   Cholelithiasis with chronic cholecystitis 09/06/2017   Multinodular goiter (nontoxic) 11/17/2015   Diabetes mellitus without complication ([place]) 91/63/8466   GERD (gastroesophageal reflux disease) 07/19/2014   HLD (hyperlipidemia) 02/09/2010   Obstructive sleep apnea 02/09/2010   Allergic rhinitis 02/09/2010   History of uterine cancer 02/09/2010    Past Surgical History:  Procedure Laterality Date   BRONCHIAL BIOPSY  04/18/2021   Procedure: BRONCHIAL BIOPSIES;  Surgeon: Collene Gobble, MD;  Location: [facility];  Service: Pulmonary;;   BRONCHIAL BRUSHINGS  04/18/2021   Procedure: BRONCHIAL BRUSHINGS;  Surgeon: Collene Gobble, MD;  Location: [facility];  Service: Pulmonary;;   BRONCHIAL NEEDLE ASPIRATION BIOPSY  04/18/2021   Procedure: BRONCHIAL NEEDLE ASPIRATION BIOPSIES;  Surgeon: Collene Gobble, MD;  Location: [facility]OSCOPY;  Service: Pulmonary;;   CARDIOVASCULAR STRESS TEST  06/09/2008   normal nuclear study w/ no ischemia/  normal LV function and wall motion , ef 83%   CHOLECYSTECTOMY N/A 09/06/2017   Procedure: LAPAROSCOPIC CHOLECYSTECTOMY WITH INTRAOPERATIVE  CHOLANGIOGRAM;  Surgeon: Armandina Gemma, MD;  Location: [facility];  Service: General;  Laterality: N/A;   CYSTOSCOPY/RETROGRADE/URETEROSCOPY/STONE EXTRACTION WITH BASKET Left 03/08/2016   Procedure: CYSTOSCOPY/RETROGRADE/URETEROSCOPY/STONE EXTRACTION WITH BASKET, STENT PLACEMENT;  Surgeon: Rana Snare, MD;  [facility];  Service: Urology;  Laterality: Left;   ENDOMETRIAL BIOPSY     ERCP N/A 09/07/2017   Procedure: ENDOSCOPIC RETROGRADE CHOLANGIOPANCREATOGRAPHY (ERCP);  Surgeon: [name], MD;  Location: [facility];  Service: Gastroenterology;  Laterality: N/A;   ESOPHAGOGASTRODUODENOSCOPY (EGD) WITH PROPOFOL N/A 03/16/2020   Procedure: ESOPHAGOGASTRODUODENOSCOPY (EGD) WITH PROPOFOL;  Surgeon: [name], MD;  Location: [facility];  Service: Gastroenterology;  Laterality: N/A;  unable to locate ampulla, aborted ERCP, changed to EGD   HOLMIUM LASER APPLICATION Left 4/33/2951   Procedure: HOLMIUM LASER APPLICATION;  Surgeon: Rana Snare, MD;  Location: [facility];  Service: Urology;  Laterality: Left;   MOUTH SURGERY     MYRINGECOTMY W/ REMOVAL MIDDLE EAR CHOLESTEATOMA TYPE 1 FASICA TYMPANOPLASTY  09/13/2000   ROBOTIC ASSISTED TOTAL HYSTERECTOMY WITH BILATERAL SALPINGO OOPHERECTOMY  08-02-2009   at [facility]  dr Denman George   w/  Bilateral pelvic and para aortic lymph node dissection's   THYROIDECTOMY N/A 11/19/2015   Procedure: TOTAL THYROIDECTOMY;  Surgeon: Armandina Gemma, MD;  Location: [facility];  Service: General;  Laterality: N/A;   TONSILLECTOMY  age 45   TRANSTHORACIC ECHOCARDIOGRAM  07/19/2014   ef 55-60%/  trivial TR   TYMPANOPLASTY Right 1993   VIDEO BRONCHOSCOPY WITH ENDOBRONCHIAL NAVIGATION Left 04/18/2021   Procedure: ROBOTIC ASSISTED BRONCHOSCOPY WITH ENDOBRONCHIAL NAVIGATION;  Surgeon: Collene Gobble, MD;  Location: [facility]OSCOPY;  Service: Pulmonary;  Laterality: Left;   VIDEO BRONCHOSCOPY WITH RADIAL ENDOBRONCHIAL ULTRASOUND  04/18/2021   Procedure: RADIAL  ENDOBRONCHIAL ULTRASOUND;  Surgeon: Collene Gobble, MD;  Location: [facility] ENDOSCOPY;  Service: Pulmonary;;     OB History     Gravida  0   Para      Term      Preterm      AB      Living         SAB      IAB      Ectopic      Multiple      Live Births              Family History  Problem Relation Age of Onset   Heart disease Sister    Heart attack Brother    Heart disease Brother    Heart disease Sister    Diabetes Sister    Breast cancer Sister    Asthma Mother    Heart disease Mother    Diabetes Mother    Emphysema Mother    Hypertension Mother    Stroke Mother    Prostate cancer Father     Social History   Tobacco Use   Smoking status: Never   Smokeless tobacco: Never  Vaping Use  Vaping Use: Never used  Substance Use Topics   Alcohol use: No   Drug use: No    Home Medications Prior to Admission medications   Medication Sig Start Date End Date Taking? Authorizing Provider  ACCU-CHEK GUIDE test strip  03/23/20   [provider]  Accu-Chek Softclix Lancets lancets daily. 07/21/20   [provider]  atorvastatin (LIPITOR) 40 MG tablet Take 40 mg by mouth daily. 08/22/20   [provider]  buPROPion (WELLBUTRIN XL) 300 MG 24 hr tablet Take 300 mg by mouth every morning. 10/19/20   [provider]  Cetirizine HCl (ZYRTEC ALLERGY) 10 MG CAPS Take 10 mg by mouth daily as needed (allergy).    [provider]  ciclopirox (PENLAC) 8 % solution Apply one coat to affected toenails once daily for 48 weeks. Remove once weekly with nail polish remover. 03/29/21   Marzetta Board, DPM  ferrous sulfate 325 (65 FE) MG EC tablet Take 325 mg by mouth daily. Once a day    [provider]  gabapentin (NEURONTIN) 400 MG capsule Take 400-1,200 mg by mouth See admin instructions. Takes 400 mg in the morning 400 mg in the afternoon and 1200 mg at night    [provider]  glimepiride (AMARYL) 4 MG tablet Take  4 mg by mouth 2 (two) times daily.     [provider]  hydrOXYzine (ATARAX/VISTARIL) 25 MG tablet Take 1 tablet (25 mg total) by mouth 3 (three) times daily as needed for anxiety. 04/04/20   Connye Burkitt, NP  ibuprofen (ADVIL) 200 MG tablet Take 400 mg by mouth every 6 (six) hours as needed for mild pain or moderate pain. Motrin    [provider]  Ibuprofen (MOTRIN PO) Take by mouth.    [provider]  JANUMET 50-1000 MG tablet Take 1 tablet by mouth 2 (two) times daily. 05/24/20   [provider]  LANTUS SOLOSTAR 100 UNIT/ML Solostar Pen Inject 55 Units into the skin 2 (two) times daily. 04/10/20   [provider]  linagliptin (TRADJENTA) 5 MG TABS tablet Take 1 tablet (5 mg total) by mouth daily with breakfast. Patient not taking: No sig reported 04/02/20   Money, Lowry Ram, FNP  liraglutide (VICTOZA) 18 MG/3ML SOPN Inject 1.8 mg into the skin daily. 04/02/20   Money, Lowry Ram, FNP  lisinopril (PRINIVIL,ZESTRIL) 5 MG tablet Take 5 mg by mouth daily.    [provider]  metFORMIN (GLUCOPHAGE) 1000 MG tablet Take 1 tablet (1,000 mg total) by mouth 2 (two) times daily with a meal. Patient not taking: No sig reported 04/01/20   Money, Lowry Ram, FNP  metoCLOPramide (REGLAN) 5 MG tablet Take 5 mg by mouth 3 (three) times daily as needed for pain. 04/07/21   [provider]  ondansetron (ZOFRAN-ODT) 4 MG disintegrating tablet Take 4 mg by mouth 2 (two) times daily as needed for nausea/vomiting. 02/25/21   [provider]  pantoprazole (PROTONIX) 40 MG tablet Take 40 mg by mouth 2 (two) times daily.     [provider]  polyethylene glycol-electrolytes (NULYTELY) 420 g solution Take 4,000 mLs by mouth once. 11/13/18   [provider]  risperiDONE (RISPERDAL) 2 MG tablet Take 2 mg by mouth at bedtime.  08/22/16   [provider]  sucralfate (CARAFATE) 1 g tablet Take 1 g by mouth 3 (three) times daily. 10/28/20    [provider]  SYNTHROID 175 MCG tablet Take 175 mcg by  mouth daily. 07/27/20   [provider]  traZODone (DESYREL) 50 MG tablet Take 1 tablet (50 mg total) by mouth at bedtime. 04/18/21   Collene Gobble, MD  Vitamin D, Cholecalciferol, 25 MCG (1000 UT) TABS Take 1,000 Units by mouth daily.    [provider]    Allergies    Hydrocodone, Tape, Other, and Codeine  Review of Systems   Review of Systems  All other systems reviewed and are negative.  Physical Exam Updated Vital Signs BP 119/66   Pulse (!) 54   Temp 98.2 F (36.8 C) (Oral)   Resp 16   SpO2 99%   Physical Exam Vitals and nursing note reviewed.  Constitutional:      General: She is not in acute distress.    Appearance: She is well-developed. She is obese. She is not ill-appearing, toxic-appearing or diaphoretic.  HENT:     Head: Normocephalic and atraumatic.     Right Ear: External ear normal.     Left Ear: External ear normal.     Nose: No congestion.  Eyes:     Conjunctiva/sclera: Conjunctivae normal.     Pupils: Pupils are equal, round, and reactive to light.  Neck:     Trachea: Phonation normal.  Cardiovascular:     Rate and Rhythm: Normal rate and regular rhythm.     Heart sounds: Normal heart sounds.  Pulmonary:     Effort: Pulmonary effort is normal. No respiratory distress.     Breath sounds: Normal breath sounds. No stridor. No wheezing or rhonchi.  Chest:     Chest wall: No tenderness.  Abdominal:     General: There is no distension.     Palpations: Abdomen is soft.     Tenderness: There is no abdominal tenderness.  Musculoskeletal:        General: Normal range of motion.     Cervical back: Normal range of motion and neck supple.  Skin:    General: Skin is warm and dry.  Neurological:     Mental Status: She is alert and oriented to person, place, and time.     Cranial Nerves: No cranial nerve deficit.     Sensory: No sensory deficit.     Motor: No abnormal  muscle tone.     Coordination: Coordination normal.  Psychiatric:        Mood and Affect: Mood normal.        Behavior: Behavior normal.        Thought Content: Thought content normal.        Judgment: Judgment normal.    ED Results / Procedures / Treatments   Labs (all labs ordered are listed, but only abnormal results are displayed) Labs Reviewed  CBC WITH DIFFERENTIAL/PLATELET - Abnormal; Notable for the following components:      Result Value   WBC 2.9 (*)    MCV 79.8 (*)    MCH 25.3 (*)    RDW 18.7 (*)    Platelets 87 (*)    All other components within normal limits  BASIC METABOLIC PANEL - Abnormal; Notable for the following components:   Potassium 3.4 (*)    Glucose, Bld 247 (*)    Calcium 8.1 (*)    All other components within normal limits  TROPONIN I (HIGH SENSITIVITY)  TROPONIN I (HIGH SENSITIVITY)    EKG EKG Interpretation  Date/Time:  Saturday April 30 2021 14:14:37 EST Ventricular Rate:  75 PR Interval:  154 QRS Duration: 79 QT  Interval:  409 QTC Calculation: 457 R Axis:   83 Text Interpretation: Sinus arrhythmia Low voltage, precordial leads Baseline wander in lead(s) II III aVF since last tracing no significant change Confirmed by Daleen Bo [phone]) on 04/30/2021 3:31:25 PM  Radiology DG Chest 2 View  Result Date: 04/30/2021 CLINICAL DATA:  Shortness of breath and cough. Lung biopsy and 04/18/2021. EXAM: CHEST - 2 VIEW COMPARISON:  CT chest 04/12/2021. FINDINGS: The heart size and mediastinal contours are within normal limits. Both lungs are clear. The visualized skeletal structures are unremarkable. Surgical clips overlie the lower neck. IMPRESSION: No active cardiopulmonary disease. Electronically Signed   By: Ronney Asters M.D.   On: 04/30/2021 15:05    Procedures Procedures   Medications Ordered in ED Medications - No data to display  ED Course  I have reviewed the triage vital signs and the nursing notes.  Pertinent labs & imaging  results that were available during my care of the patient were reviewed by me and considered in my medical decision making (see chart for details).    MDM Rules/Calculators/A&P                            Patient Vitals for the past 24 hrs:  BP Temp Temp src Pulse Resp SpO2  04/30/21 1745 119/66 -- -- (!) 54 16 99 %  04/30/21 1700 113/68 -- -- (!) 58 17 96 %  04/30/21 1645 -- -- -- (!) 57 18 97 %  04/30/21 1630 -- -- -- 80 (!) 24 97 %  04/30/21 1628 105/82 -- -- 60 16 96 %  04/30/21 1445 -- -- -- 73 (!) 23 96 %  04/30/21 1430 -- -- -- 77 16 94 %  04/30/21 1416 120/77 98.2 F (36.8 C) Oral 81 16 96 %  04/30/21 1415 120/77 -- -- 81 15 95 %    6:34 PM Reevaluation with update and discussion. After initial assessment and treatment, an updated evaluation reveals no change in status, findings discussed and questions answered. Daleen Bo   Medical Decision Making:  This patient is presenting for evaluation of his discomfort and cough, which does require a range of treatment options, and is a complaint that involves a moderate risk of morbidity and mortality. The differential diagnoses include bronchitis, asthma, complications from recent lung biopsy. I decided to review old records, and in summary middle-aged female, being followed for carcinoid pulmonary nodule, presenting with symptoms after transtracheal lung biopsy..  I did not require additional historical information from any 1.  Clinical Laboratory Tests Ordered, included CBC, Metabolic panel, and troponin . Review indicates Except potassium low, glucose high, calcium low, white count low, MCV low. Radiologic Tests Ordered, included chest x-ray.  I independently Visualized: Radiographic images, which show no pneumonia or pneumothorax  Cardiac Monitor Tracing which shows normal sinus rhythm    Critical Interventions-clinical evaluation, laboratory testing, radiography, observation reassessment  After These Interventions, the  Patient was reevaluated and was found stable for discharge.  No postbiopsy complications.  No evidence for pneumonia or ACS.  Stable for discharge with symptomatic treatment.  CRITICAL CARE-no Performed by: Daleen Bo  Nursing Notes Reviewed/ Care Coordinated Applicable Imaging Reviewed Interpretation of Laboratory Data incorporated into ED treatment  The patient appears reasonably screened and/or stabilized for discharge and I doubt any other medical condition or other [facility] requiring further screening, evaluation, or treatment in the ED at this time prior to discharge.  Plan: Home  Medications-continue usual and use OTC cough medicine with mucolytic; Home Treatments-rest, fluids; return here if the recommended treatment, does not improve the symptoms; Recommended follow up-PCP and pulmonary.     Final Clinical Impression(s) / ED Diagnoses Final diagnoses:  Cough, unspecified type  Nonspecific chest pain    Rx / DC Orders ED Discharge Orders     None        Daleen Bo, MD 04/30/21 [phone]

## 2021-04-30 NOTE — ED Notes (Signed)
Pt up to restroom.

## 2021-04-30 NOTE — Discharge Instructions (Signed)
The testing did not show any complications from the lung biopsy or other acute problems with your heart or lungs.  To help your symptoms, use a cough medicine containing both guaifenesin and dextromethorphan.  You can use this 3-4 times a day to help your discomfort.  Make sure you are getting plenty of rest and drinking a lot of fluids, especially water.  Follow-up with your primary care doctor as needed for problems.

## 2021-04-30 NOTE — ED Triage Notes (Addendum)
Pt c/o cough, chest discomfort, and intermittent sob for the past 3 days. Pt reports she had a biopsy on a pulmonary nodule on the 31st of November. Pt was informed she could potentially have these symptoms, and is here to rule out pneumonia or any other causes.

## 2021-04-30 NOTE — ED Provider Notes (Signed)
Emergency Medicine Provider Triage Evaluation Note  Rhonda Higgins , a 50 y.o. female  was evaluated in triage.  Pt complains of chest discomfort and cough. She states that the symptoms have bene ongoing for 3 days. She endorses 'feeling like something is stuck in my chest.' Of note, she had a lung biopsy done on October 31 and was told to watch out for signs of developing pneumonia from this. She states that the feeling is substernal in nature and is worse with exertion and has some associated shortness of breath. The feeling does not radiate. She has been taking Motrin without relief. Denies any other URI symptoms  Review of Systems  Positive: Cough, chest pain, shortness of breath Negative: Fever, chills, congestion  Physical Exam  BP 120/77 (BP Location: Left Arm)   Pulse 81   Temp 98.2 F (36.8 C) (Oral)   Resp 16   SpO2 96%  Gen:   Awake, no distress   Resp:  Normal effort  MSK:   Moves extremities without difficulty  Other:    Medical Decision Making  Medically screening exam initiated at 2:26 PM.  Appropriate orders placed.  Rhonda Higgins was informed that the remainder of the evaluation will be completed by another provider, this initial triage assessment does not replace that evaluation, and the importance of remaining in the ED until their evaluation is complete.     Nestor Lewandowsky 04/30/21 1429    Tegeler, Gwenyth Allegra, MD 04/30/21 [phone]

## 2021-04-30 NOTE — ED Notes (Signed)
Pt NAD in bed, a/ox4. Breathing even and unlabored, speaking in full and complete sentences. Pt c/o 6/10 central chest heaviness with dry cough x 3 days. Pt states both came on at same time. Denies SOB, arm/jaw pain, dizziness or vomiting. LS clear bilaterally. -change on inspiration, palpation or movement.

## 2021-05-20 ENCOUNTER — Ambulatory Visit (INDEPENDENT_AMBULATORY_CARE_PROVIDER_SITE_OTHER): Payer: Medicaid Other | Admitting: Surgical

## 2021-05-20 ENCOUNTER — Other Ambulatory Visit: Payer: Self-pay

## 2021-05-20 ENCOUNTER — Encounter: Payer: Self-pay | Admitting: Surgical

## 2021-05-20 ENCOUNTER — Ambulatory Visit (INDEPENDENT_AMBULATORY_CARE_PROVIDER_SITE_OTHER): Payer: Medicaid Other

## 2021-05-20 DIAGNOSIS — M25512 Pain in left shoulder: Secondary | ICD-10-CM

## 2021-05-20 DIAGNOSIS — M4802 Spinal stenosis, cervical region: Secondary | ICD-10-CM | POA: Diagnosis not present

## 2021-05-20 NOTE — Progress Notes (Signed)
Office Visit Note   Patient: Rhonda Higgins Applegarth           Date of Birth: 12/05/1970           MRN: [id number] Visit Date: 05/20/2021 Requested by: [name], MD [address] [place],  [place] [address] PCP: [name], MD  Subjective: Chief Complaint  Patient presents with   Left Shoulder - Follow-up    HPI: Rhonda Higgins Applegarth is a 50 y.o. female who presents to the office complaining of left shoulder pain.  Patient complains of sore constant pain in the left shoulder that she localizes mostly to the posterior aspect of the shoulder with radiation to the anterior and occasional radiation to the posterior aspect of the elbow.  She has associated medial scapular border pain.  No numbness or tingling in the arm.  This has been going on for 1 to 2 months.  No history of recent injury.  She is taking over-the-counter Motrin for pain.  She cannot really sleep on the left side due to pain.  She does have some neck pain as well mostly on the left side and no midline neck pain.  No history of previous left shoulder surgery.  This does feel similar to the right shoulder pain she was having several months ago that was successfully treated with a cervical spine ESI..                ROS: All systems reviewed are negative as they relate to the chief complaint within the history of present illness.  Patient denies fevers or chills.  Assessment & Plan: Visit Diagnoses:  1. Foraminal stenosis of cervical region   2. Left shoulder pain, unspecified chronicity     Plan: Patient is a 50 year old female who presents for evaluation of left shoulder pain.  This feels similar to the right shoulder pain she was having months ago that was successfully treated with cervical spine ESI.  She has recent MRI cervical spine from December 2021 with that scan showing mild foraminal narrowing on the left at C4-C5, moderate foraminal stenosis at C5-C6 on the left, severe foraminal stenosis on the left at  C6-C7.  A lot of her symptoms point towards cervical spine etiology and with her significant degenerative changes on recent MRI scan, plan to refer patient to Dr. Laurence Spates for cervical spine ESI for left shoulder pain.  She will return 2 to 4 weeks after the injection if she has no relief and we will reevaluate her at that point.  She does have some tenderness over the [facility] joint that is asymmetric as well as a little bit of subtle weakness with external rotation and supraspinatus.  If no relief from cervical spine ESI, could consider MRI arthrogram for further evaluation of left shoulder pain.  Patient agrees with plan.  Follow-up after injection.  Follow-Up Instructions: No follow-ups on file.   Orders:  Orders Placed This Encounter  Procedures   XR Shoulder Left   Ambulatory referral to Physical Medicine Rehab   No orders of the defined types were placed in this encounter.     Procedures: No procedures performed   Clinical Data: No additional findings.  Objective: Vital Signs: There were no vitals taken for this visit.  Physical Exam:  Constitutional: Patient appears well-developed HEENT:  Head: Normocephalic Eyes:EOM are normal Neck: Normal range of motion Cardiovascular: Normal rate Pulmonary/chest: Effort normal Neurologic: Patient is alert Skin: Skin is warm Psychiatric: Patient has normal mood  and affect  Ortho Exam: Ortho exam demonstrates left shoulder with 50 degrees external rotation, 110 degrees abduction, 180 degrees forward flexion.  No crepitus noted with passive motion of the shoulder.  Tenderness over the bicipital groove and AC joint does not present on the contralateral side.  5/5 motor strength of bilateral grip strength, finger abduction, pronation/supination, bicep, tricep, deltoid.  5 -/5 strength of infraspinatus and supraspinatus.  5/5 motor strength of subscapularis.  Negative Hornblower sign.  Negative drop arm test.  Negative belly press test.  Mild  tenderness over the axial cervical spine.  Mild pain with cervical spine range of motion.  Specialty Comments:  No specialty comments available.  Imaging: No results found.   PMFS History: Patient Active Problem List   Diagnosis Date Noted   Status post bronchoscopy 04/20/2021   Pulmonary nodule 1 cm or greater in diameter 03/17/2021   Elevated levels of transaminase & lactic acid dehydrogenase 08/03/2020   Epigastric pain 08/03/2020   Liver nodule 08/03/2020   Obstruction of bile duct 08/03/2020   Cirrhosis of liver ([place]) 07/29/2020   Gastroparesis 07/29/2020   Irritable bowel syndrome 07/29/2020   Steatosis of liver 07/29/2020   MDD (major depressive disorder), recurrent episode, severe ([place]) 04/01/2020   Borderline personality disorder ([place]) 04/01/2020   Intermittent explosive disorder in adult 04/01/2020   Cholelithiasis with chronic cholecystitis 09/06/2017   Multinodular goiter (nontoxic) 11/17/2015   Diabetes mellitus without complication ([place]) 26/37/8588   GERD (gastroesophageal reflux disease) 07/19/2014   HLD (hyperlipidemia) 02/09/2010   Obstructive sleep apnea 02/09/2010   Allergic rhinitis 02/09/2010   History of uterine cancer 02/09/2010   Past Medical History:  Diagnosis Date   Adenocarcinoma ([place])    endometrial, FIGO GRADE 1   Allergic rhinitis    Atypical chest pain    History of   Depression    Elevated liver enzymes    GERD (gastroesophageal reflux disease)    Hematuria    History of endometrial cancer 08-02-2009   oncologist-  dr brewster/ Denman George and dr kinard/  no recurrence   endometrial adenocarinoma Stage 1B, Grade 1, FIGO--  s/p  TAH w/ BSO and pelvic lymph node dissection's and radiation therapy   History of kidney stones    History of radiation therapy    2011  pelvic intracavity brachytherapy treatment's for endometrial carcinoma   History of thyroid nodule    multinodular goiter s/p  total thyroidectomy 11-19-2015  per pathology -   adenomatoid nodules   Hyperlipidemia    Hypothyroidism, postsurgical    Insulin dependent diabetes mellitus    Type 2   Left ureteral stone    Obesity    OSA (obstructive sleep apnea)    severe OSA  per study 03-08-2010--  noncomplant cpap (no more sleep apnes since lost weight 2019)   Overactive bladder    Personality disorder (HCC)    Polyphagia(783.6)    PONV (postoperative nausea and vomiting)    after ear surgery only one time   Right lower quadrant pain    Urgency of urination    UTI (urinary tract infection)     Family History  Problem Relation Age of Onset   Heart disease Sister    Heart attack Brother    Heart disease Brother    Heart disease Sister    Diabetes Sister    Breast cancer Sister    Asthma Mother    Heart disease Mother    Diabetes Mother    Emphysema Mother  Hypertension Mother    Stroke Mother    Prostate cancer Father     Past Surgical History:  Procedure Laterality Date   BRONCHIAL BIOPSY  04/18/2021   Procedure: BRONCHIAL BIOPSIES;  Surgeon: Collene Gobble, MD;  Location: [facility] ENDOSCOPY;  Service: Pulmonary;;   BRONCHIAL BRUSHINGS  04/18/2021   Procedure: BRONCHIAL BRUSHINGS;  Surgeon: Collene Gobble, MD;  Location: [facility];  Service: Pulmonary;;   BRONCHIAL NEEDLE ASPIRATION BIOPSY  04/18/2021   Procedure: BRONCHIAL NEEDLE ASPIRATION BIOPSIES;  Surgeon: Collene Gobble, MD;  Location: [facility] ENDOSCOPY;  Service: Pulmonary;;   CARDIOVASCULAR STRESS TEST  06/09/2008   normal nuclear study w/ no ischemia/  normal LV function and wall motion , ef 83%   CHOLECYSTECTOMY N/A 09/06/2017   Procedure: LAPAROSCOPIC CHOLECYSTECTOMY WITH INTRAOPERATIVE CHOLANGIOGRAM;  Surgeon: Armandina Gemma, MD;  Location: [facility] ORS;  Service: General;  Laterality: N/A;   CYSTOSCOPY/RETROGRADE/URETEROSCOPY/STONE EXTRACTION WITH BASKET Left 03/08/2016   Procedure: CYSTOSCOPY/RETROGRADE/URETEROSCOPY/STONE EXTRACTION WITH BASKET, STENT PLACEMENT;  Surgeon: Rana Snare, MD;   Location[facility];  Service: Urology;  Laterality: Left;   ENDOMETRIAL BIOPSY     ERCP N/A 09/07/2017   Procedure: ENDOSCOPIC RETROGRADE CHOLANGIOPANCREATOGRAPHY (ERCP);  Surgeon: [name], MD;  Location: [facility] ENDOSCOPY;  Service: Gastroenterology;  Laterality: N/A;   ESOPHAGOGASTRODUODENOSCOPY (EGD) WITH PROPOFOL N/A 03/16/2020   Procedure: ESOPHAGOGASTRODUODENOSCOPY (EGD) WITH PROPOFOL;  Surgeon: [name], MD;  Location: [facility] ENDOSCOPY;  Service: Gastroenterology;  Laterality: N/A;  unable to locate ampulla, aborted ERCP, changed to EGD   HOLMIUM LASER APPLICATION Left 8/56/3149   Procedure: HOLMIUM LASER APPLICATION;  Surgeon: Rana Snare, MD;  Location: [facility];  Service: Urology;  Laterality: Left;   MOUTH SURGERY     MYRINGECOTMY W/ REMOVAL MIDDLE EAR CHOLESTEATOMA TYPE 1 FASICA TYMPANOPLASTY  09/13/2000   ROBOTIC ASSISTED TOTAL HYSTERECTOMY WITH BILATERAL SALPINGO OOPHERECTOMY  08-02-2009   at [facility]  dr Denman George   w/  Bilateral pelvic and para aortic lymph node dissection's   THYROIDECTOMY N/A 11/19/2015   Procedure: TOTAL THYROIDECTOMY;  Surgeon: Armandina Gemma, MD;  Location: [facility];  Service: General;  Laterality: N/A;   TONSILLECTOMY  age 76   TRANSTHORACIC ECHOCARDIOGRAM  07/19/2014   ef 55-60%/  trivial TR   TYMPANOPLASTY Right 1993   VIDEO BRONCHOSCOPY WITH ENDOBRONCHIAL NAVIGATION Left 04/18/2021   Procedure: ROBOTIC ASSISTED BRONCHOSCOPY WITH ENDOBRONCHIAL NAVIGATION;  Surgeon: Collene Gobble, MD;  Location: [facility] ENDOSCOPY;  Service: Pulmonary;  Laterality: Left;   VIDEO BRONCHOSCOPY WITH RADIAL ENDOBRONCHIAL ULTRASOUND  04/18/2021   Procedure: RADIAL ENDOBRONCHIAL ULTRASOUND;  Surgeon: Collene Gobble, MD;  Location: [facility] ENDOSCOPY;  Service: Pulmonary;;   Social History   Occupational History   Occupation: n/a  Tobacco Use   Smoking status: Never   Smokeless tobacco: Never  Vaping Use   Vaping Use: Never used  Substance and Sexual Activity    Alcohol use: No   Drug use: No   Sexual activity: Yes    Birth control/protection: None

## 2021-05-24 ENCOUNTER — Ambulatory Visit: Payer: Medicaid Other | Admitting: Podiatry

## 2021-06-09 ENCOUNTER — Encounter: Payer: Self-pay | Admitting: Physical Medicine and Rehabilitation

## 2021-06-09 ENCOUNTER — Ambulatory Visit: Payer: Self-pay

## 2021-06-09 ENCOUNTER — Ambulatory Visit (INDEPENDENT_AMBULATORY_CARE_PROVIDER_SITE_OTHER): Payer: Medicaid Other | Admitting: Physical Medicine and Rehabilitation

## 2021-06-09 ENCOUNTER — Other Ambulatory Visit: Payer: Self-pay

## 2021-06-09 VITALS — BP 123/67 | HR 56

## 2021-06-09 DIAGNOSIS — M5412 Radiculopathy, cervical region: Secondary | ICD-10-CM | POA: Diagnosis not present

## 2021-06-09 MED ORDER — METHYLPREDNISOLONE ACETATE 80 MG/ML IJ SUSP
80.0000 mg | Freq: Once | INTRAMUSCULAR | Status: AC
  Administered 2021-06-09: 16:00:00 80 mg

## 2021-06-09 NOTE — Patient Instructions (Signed)

## 2021-06-09 NOTE — Progress Notes (Signed)
Pt state neck pain that travels to both shoulders. Pt state raising her arms makes the pain worse. Pt state she takes over the counter pain meds to help ease her pain.  Numeric Pain Rating Scale and Functional Assessment Average Pain 9   In the last MONTH (on 0-10 scale) has pain interfered with the following?  1. General activity like being  able to carry out your everyday physical activities such as walking, climbing stairs, carrying groceries, or moving a chair?  Rating(10)   +Driver, -BT, -Dye Allergies.

## 2021-06-10 ENCOUNTER — Telehealth: Payer: Self-pay

## 2021-06-10 NOTE — Telephone Encounter (Signed)
Patient contacted the office and states that she had procedure on 06/09/2021 and has developed a rash. States that she is able to take benadryl and will monitor over the weekend she wanted to make you aware.

## 2021-06-19 DIAGNOSIS — C3430 Malignant neoplasm of lower lobe, unspecified bronchus or lung: Secondary | ICD-10-CM

## 2021-06-19 HISTORY — DX: Malignant neoplasm of lower lobe, unspecified bronchus or lung: C34.30

## 2021-06-24 ENCOUNTER — Ambulatory Visit (INDEPENDENT_AMBULATORY_CARE_PROVIDER_SITE_OTHER): Payer: Medicaid Other | Admitting: Podiatry

## 2021-06-24 ENCOUNTER — Other Ambulatory Visit: Payer: Self-pay

## 2021-06-24 ENCOUNTER — Encounter: Payer: Self-pay | Admitting: Podiatry

## 2021-06-24 DIAGNOSIS — D509 Iron deficiency anemia, unspecified: Secondary | ICD-10-CM | POA: Insufficient documentation

## 2021-06-24 DIAGNOSIS — M79675 Pain in left toe(s): Secondary | ICD-10-CM

## 2021-06-24 DIAGNOSIS — M79674 Pain in right toe(s): Secondary | ICD-10-CM | POA: Diagnosis not present

## 2021-06-24 DIAGNOSIS — E1142 Type 2 diabetes mellitus with diabetic polyneuropathy: Secondary | ICD-10-CM

## 2021-06-24 DIAGNOSIS — B351 Tinea unguium: Secondary | ICD-10-CM

## 2021-06-24 DIAGNOSIS — E119 Type 2 diabetes mellitus without complications: Secondary | ICD-10-CM

## 2021-06-24 DIAGNOSIS — D500 Iron deficiency anemia secondary to blood loss (chronic): Secondary | ICD-10-CM | POA: Insufficient documentation

## 2021-06-24 NOTE — Progress Notes (Signed)
ANNUAL DIABETIC FOOT EXAM  Subjective: Rhonda Higgins presents today for for annual diabetic foot examination and painful elongated mycotic toenails 1-5 bilaterally which are tender when wearing enclosed shoe gear. Pain is relieved with periodic professional debridement..  Patient denies any h/o foot wounds.  Patient has been diagnosed with neuropathy and it is managed with gabapentin.  Patient hasn't checked blood glucose today, but states it has been high lately.   [name], MD is patient's PCP. Last visit was 11/17/2020  Patient states her insurance would not cover the prescription for Jublia.  She is accompanied by her wife, Rhonda Higgins, on today's visit.  Past Medical History:  Diagnosis Date   Adenocarcinoma ([place])    endometrial, FIGO GRADE 1   Allergic rhinitis    Atypical chest pain    History of   Depression    Elevated liver enzymes    GERD (gastroesophageal reflux disease)    Hematuria    History of endometrial cancer 08-02-2009   oncologist-  dr brewster/ [facility] George and dr kinard/  no recurrence   endometrial adenocarinoma Stage 1B, Grade 1, FIGO--  s/p  TAH w/ BSO and pelvic lymph node dissection's and radiation therapy   History of kidney stones    History of radiation therapy    2011  pelvic intracavity brachytherapy treatment's for endometrial carcinoma   History of thyroid nodule    multinodular goiter s/p  total thyroidectomy 11-19-2015  per pathology -  adenomatoid nodules   Hyperlipidemia    Hypothyroidism, postsurgical    Insulin dependent diabetes mellitus    Type 2   Left ureteral stone    Obesity    OSA (obstructive sleep apnea)    severe OSA  per study 03-08-2010--  noncomplant cpap (no more sleep apnes since lost weight 2019)   Overactive bladder    Personality disorder (HCC)    Polyphagia(783.6)    PONV (postoperative nausea and vomiting)    after ear surgery only one time   Right lower quadrant pain    Urgency of urination     UTI (urinary tract infection)    Patient Active Problem List   Diagnosis Date Noted   Iron deficiency anemia 06/24/2021   Status post bronchoscopy 04/20/2021   Pulmonary nodule 1 cm or greater in diameter 03/17/2021   Elevated levels of transaminase & lactic acid dehydrogenase 08/03/2020   Epigastric pain 08/03/2020   Liver nodule 08/03/2020   Obstruction of bile duct 08/03/2020   Cirrhosis of liver ([place]) 07/29/2020   Gastroparesis 07/29/2020   Irritable bowel syndrome 07/29/2020   Steatosis of liver 07/29/2020   MDD (major depressive disorder), recurrent episode, severe ([place]) 04/01/2020   Borderline personality disorder ([place]) 04/01/2020   Intermittent explosive disorder in adult 04/01/2020   Cholelithiasis with chronic cholecystitis 09/06/2017   Multinodular goiter (nontoxic) 11/17/2015   Diabetes mellitus without complication ([place]) 44/81/8563   GERD (gastroesophageal reflux disease) 07/19/2014   HLD (hyperlipidemia) 02/09/2010   Obstructive sleep apnea 02/09/2010   Allergic rhinitis 02/09/2010   History of uterine cancer 02/09/2010   Past Surgical History:  Procedure Laterality Date   BRONCHIAL BIOPSY  04/18/2021   Procedure: BRONCHIAL BIOPSIES;  Surgeon: Collene Gobble, MD;  Location: [facility];  Service: Pulmonary;;   BRONCHIAL BRUSHINGS  04/18/2021   Procedure: BRONCHIAL BRUSHINGS;  Surgeon: Collene Gobble, MD;  Location: [facility];  Service: Pulmonary;;   BRONCHIAL NEEDLE ASPIRATION BIOPSY  04/18/2021   Procedure: BRONCHIAL NEEDLE ASPIRATION BIOPSIES;  Surgeon: Lamonte Sakai,  Rose Fillers, MD;  Location: [facility] ENDOSCOPY;  Service: Pulmonary;;   CARDIOVASCULAR STRESS TEST  06/09/2008   normal nuclear study w/ no ischemia/  normal LV function and wall motion , ef 83%   CHOLECYSTECTOMY N/A 09/06/2017   Procedure: LAPAROSCOPIC CHOLECYSTECTOMY WITH INTRAOPERATIVE CHOLANGIOGRAM;  Surgeon: Armandina Gemma, MD;  [facility]: [facility];  Service: General;  Laterality: N/A;    CYSTOSCOPY/RETROGRADE/URETEROSCOPY/STONE EXTRACTION WITH BASKET Left 03/08/2016   Procedure: CYSTOSCOPY/RETROGRADE/URETEROSCOPY/STONE EXTRACTION WITH BASKET, STENT PLACEMENT;  Surgeon: Rana Snare, MD;  Location: [facility];  Service: Urology;  Laterality: Left;   ENDOMETRIAL BIOPSY     ERCP N/A 09/07/2017   Procedure: ENDOSCOPIC RETROGRADE CHOLANGIOPANCREATOGRAPHY (ERCP);  Surgeon: [name], MD;  Location: [facility];  Service: Gastroenterology;  Laterality: N/A;   ESOPHAGOGASTRODUODENOSCOPY (EGD) WITH PROPOFOL N/A 03/16/2020   Procedure: ESOPHAGOGASTRODUODENOSCOPY (EGD) WITH PROPOFOL;  Surgeon: [name], MD;  [facility];  Service: Gastroenterology;  Laterality: N/A;  unable to locate ampulla, aborted ERCP, changed to EGD   HOLMIUM LASER APPLICATION Left 0/30/0923   Procedure: HOLMIUM LASER APPLICATION;  Surgeon: Rana Snare, MD;  Location: [facility];  Service: Urology;  Laterality: Left;   MOUTH SURGERY     MYRINGECOTMY W/ REMOVAL MIDDLE EAR CHOLESTEATOMA TYPE 1 FASICA TYMPANOPLASTY  09/13/2000   ROBOTIC ASSISTED TOTAL HYSTERECTOMY WITH BILATERAL SALPINGO OOPHERECTOMY  08-02-2009   at [facility]  dr Denman George   w/  Bilateral pelvic and para aortic lymph node dissection's   THYROIDECTOMY N/A 11/19/2015   Procedure: TOTAL THYROIDECTOMY;  Surgeon: Armandina Gemma, MD;  Location: [facility];  Service: General;  Laterality: N/A;   TONSILLECTOMY  age 51   TRANSTHORACIC ECHOCARDIOGRAM  07/19/2014   ef 55-60%/  trivial TR   TYMPANOPLASTY Right 1993   VIDEO BRONCHOSCOPY WITH ENDOBRONCHIAL NAVIGATION Left 04/18/2021   Procedure: ROBOTIC ASSISTED BRONCHOSCOPY WITH ENDOBRONCHIAL NAVIGATION;  Surgeon: Collene Gobble, MD;  Location: [facility] ENDOSCOPY;  Service: Pulmonary;  Laterality: Left;   VIDEO BRONCHOSCOPY WITH RADIAL ENDOBRONCHIAL ULTRASOUND  04/18/2021   Procedure: RADIAL ENDOBRONCHIAL ULTRASOUND;  Surgeon: Collene Gobble, MD;  Location: [facility] ENDOSCOPY;  Service:  Pulmonary;;   Current Outpatient Medications on File Prior to Visit  Medication Sig Dispense Refill   Peppermint Oil (IBGARD) 90 MG CPCR 2 capsules     ACCU-CHEK GUIDE test strip      Accu-Chek Softclix Lancets lancets daily.     atorvastatin (LIPITOR) 40 MG tablet Take 40 mg by mouth daily.     buPROPion (WELLBUTRIN XL) 300 MG 24 hr tablet Take 300 mg by mouth every morning.     buPROPion (WELLBUTRIN XL) 300 MG 24 hr tablet 1 tablet in the morning     Cetirizine HCl (ZYRTEC ALLERGY) 10 MG CAPS Take 10 mg by mouth daily as needed (allergy).     ciclopirox (PENLAC) 8 % solution Apply one coat to affected toenails once daily for 48 weeks. Remove once weekly with nail polish remover. 6.6 mL 11   ferrous sulfate 325 (65 FE) MG EC tablet Take 325 mg by mouth daily. Once a day     gabapentin (NEURONTIN) 400 MG capsule Take 400-1,200 mg by mouth See admin instructions. Takes 400 mg in the morning 400 mg in the afternoon and 1200 mg at night     glimepiride (AMARYL) 4 MG tablet Take 4 mg by mouth 2 (two) times daily.   5   hydrOXYzine (ATARAX/VISTARIL) 25 MG tablet Take 1 tablet (25 mg total) by mouth 3 (three) times  daily as needed for anxiety. 90 tablet 0   ibuprofen (ADVIL) 200 MG tablet Take 400 mg by mouth every 6 (six) hours as needed for mild pain or moderate pain. Motrin     Ibuprofen (MOTRIN PO) Take by mouth.     JANUMET 50-1000 MG tablet Take 1 tablet by mouth 2 (two) times daily.     LANTUS SOLOSTAR 100 UNIT/ML Solostar Pen Inject 55 Units into the skin 2 (two) times daily.     linagliptin (TRADJENTA) 5 MG TABS tablet Take 1 tablet (5 mg total) by mouth daily with breakfast. 30 tablet    liraglutide (VICTOZA) 18 MG/3ML SOPN Inject 1.8 mg into the skin daily.     lisinopril (PRINIVIL,ZESTRIL) 5 MG tablet Take 5 mg by mouth daily.     metFORMIN (GLUCOPHAGE) 1000 MG tablet Take 1 tablet (1,000 mg total) by mouth 2 (two) times daily with a meal.     metoCLOPramide (REGLAN) 5 MG tablet Take 5  mg by mouth 3 (three) times daily as needed for pain.     ondansetron (ZOFRAN-ODT) 4 MG disintegrating tablet Take 4 mg by mouth 2 (two) times daily as needed for nausea/vomiting.     pantoprazole (PROTONIX) 40 MG tablet Take 40 mg by mouth 2 (two) times daily.   0   polyethylene glycol-electrolytes (NULYTELY) 420 g solution Take 4,000 mLs by mouth once.     risperiDONE (RISPERDAL) 2 MG tablet Take 2 mg by mouth at bedtime.   2   sucralfate (CARAFATE) 1 g tablet Take 1 g by mouth 3 (three) times daily.     SYNTHROID 175 MCG tablet Take 175 mcg by mouth daily.     traZODone (DESYREL) 50 MG tablet Take 1 tablet (50 mg total) by mouth at bedtime.     Vitamin D, Cholecalciferol, 25 MCG (1000 UT) TABS Take 1,000 Units by mouth daily.     Vitamin D, Cholecalciferol, 25 MCG (1000 UT) TABS 1 tablet     Current Facility-Administered Medications on File Prior to Visit  Medication Dose Route Frequency Provider Last Rate Last Admin   methylPREDNISolone acetate (DEPO-MEDROL) injection 80 mg  80 mg Other Once Magnus Sinning, MD        Allergies  Allergen Reactions   Hydrocodone Shortness Of Breath and Itching   Tape Rash    Paper tape is ok   Other Other (See Comments)    Pholcodine Unknown   Codeine Itching   Social History   Occupational History   Occupation: n/a  Tobacco Use   Smoking status: Never   Smokeless tobacco: Never  Vaping Use   Vaping Use: Never used  Substance and Sexual Activity   Alcohol use: No   Drug use: No   Sexual activity: Yes    Birth control/protection: None   Family History  Problem Relation Age of Onset   Heart disease Sister    Heart attack Brother    Heart disease Brother    Heart disease Sister    Diabetes Sister    Breast cancer Sister    Asthma Mother    Heart disease Mother    Diabetes Mother    Emphysema Mother    Hypertension Mother    Stroke Mother    Prostate cancer Father    There is no immunization history for the selected  administration types on file for this patient.   Review of Systems: Negative except as noted in the HPI.   Objective: There were no vitals  filed for this visit.  Rhonda Higgins is a pleasant 51 y.o. female in NAD. AAO X 3.  Vascular Examination: CFT <3 seconds b/l LE. Palpable pedal pulses b/l LE. Pedal hair absent. No pain with calf compression b/l. Lower extremity skin temperature gradient within normal limits. No edema noted b/l LE. No cyanosis or clubbing noted b/l LE.  Dermatological Examination: Pedal skin thin and atrophic b/l LE. No open wounds b/l LE. No interdigital macerations noted b/l LE. Toenails 1-5 b/l elongated, discolored, dystrophic, thickened, crumbly with subungual debris and tenderness to dorsal palpation.  Musculoskeletal Examination: Normal muscle strength 5/5 to all lower extremity muscle groups bilaterally. No pain, crepitus or joint limitation noted with ROM b/l LE. No gross bony pedal deformities b/l. Patient ambulates independently without assistive aids.  Footwear Assessment: Does the patient wear appropriate shoes? Yes. Does the patient need inserts/orthotics? No.  Neurological Examination: Pt has subjective symptoms of neuropathy. Protective sensation intact 5/5 intact bilaterally with 10g monofilament b/l. Vibratory sensation intact b/l.  Assessment: 1. Pain due to onychomycosis of toenails of both feet   2. Diabetic peripheral neuropathy associated with type 2 diabetes mellitus ([place])   3. Encounter for diabetic foot exam ([place])      ADA Risk Categorization: Low Risk :  Patient has all of the following: Intact protective sensation No prior foot ulcer  No severe deformity Pedal pulses present  Plan: -No new findings. No new orders. -Diabetic foot examination performed today. -Continue foot and shoe inspections daily. Monitor blood glucose per PCP/Endocrinologist's recommendations. -Mycotic toenails 1-5 bilaterally were debrided in  length and girth with sterile nail nippers and dremel without incident. -Patient/POA to call should there be question/concern in the interim.  Return in about 3 months (around 09/22/2021).  Marzetta Board, DPM

## 2021-07-13 NOTE — Procedures (Signed)
Cervical Epidural Steroid Injection - Interlaminar Approach with Fluoroscopic Guidance  Patient: Rhonda Higgins      Date of Birth: 27-Jul-1970 MRN: [id number] PCP: [name], MD      Visit Date: 06/09/2021   Universal Protocol:    Date/Time: 07/13/2309:07 AM  Consent Given By: the patient  Position: PRONE  Additional Comments: Vital signs were monitored before and after the procedure. Patient was prepped and draped in the usual sterile fashion. The correct patient, procedure, and site was verified.   Injection Procedure Details:   Procedure diagnoses: Cervical radiculopathy [M54.12]    Meds Administered:  Meds ordered this encounter  Medications   methylPREDNISolone acetate (DEPO-MEDROL) injection 80 mg     Laterality: Left  Location/Site: C7-T1  Needle: 3.5 in., 20 ga. Tuohy  Needle Placement: Paramedian epidural space  Findings:  -Comments: Excellent flow of contrast into the epidural space.  Procedure Details: Using a paramedian approach from the side mentioned above, the region overlying the inferior lamina was localized under fluoroscopic visualization and the soft tissues overlying this structure were infiltrated with 4 ml. of 1% Lidocaine without Epinephrine. A # 20 gauge, Tuohy needle was inserted into the epidural space using a paramedian approach.  The epidural space was localized using loss of resistance along with contralateral oblique bi-planar fluoroscopic views.  After negative aspirate for air, blood, and CSF, a 2 ml. volume of Isovue-250 was injected into the epidural space and the flow of contrast was observed. Radiographs were obtained for documentation purposes.   The injectate was administered into the level noted above.  Additional Comments:  The patient tolerated the procedure well Dressing: 2 x 2 sterile gauze and Band-Aid    Post-procedure details: Patient was observed during the procedure. Post-procedure instructions were  reviewed.  Patient left the clinic in stable condition.

## 2021-07-13 NOTE — Progress Notes (Signed)
Rhonda Higgins - 51 y.o. female MRN [id number]  Date of birth: [birth year]  Office Visit Note: Visit Date: 06/09/2021 PCP: [name], MD Referred by: Donella Stade, PA-C  Subjective: Chief Complaint  Patient presents with   Neck - Pain   Right Shoulder - Pain   Left Shoulder - Pain   HPI:  Rhonda Higgins is a 51 y.o. female who comes in today for planned repeat Left C7-T1  Cervical Interlaminar epidural steroid injection with fluoroscopic guidance.  The patient has failed conservative care including home exercise, medications, time and activity modification.  This injection will be diagnostic and hopefully therapeutic.  Please see requesting physician notes for further details and justification. Patient received more than 50% pain relief from prior injection.   Referring: Annie Main, PA-C   ROS Otherwise per HPI.  Assessment & Plan: Visit Diagnoses:    ICD-10-CM   1. Cervical radiculopathy  M54.12 XR C-ARM NO REPORT    Epidural Steroid injection    methylPREDNISolone acetate (DEPO-MEDROL) injection 80 mg      Plan: No additional findings.   Meds & Orders:  Meds ordered this encounter  Medications   methylPREDNISolone acetate (DEPO-MEDROL) injection 80 mg    Orders Placed This Encounter  Procedures   XR C-ARM NO REPORT   Epidural Steroid injection    Follow-up: Return if symptoms worsen or fail to improve.   Procedures: No procedures performed  Cervical Epidural Steroid Injection - Interlaminar Approach with Fluoroscopic Guidance  Patient: Rhonda Higgins      Date of Birth: 1971/01/28 MRN: [id number] PCP: [name], MD      Visit Date: 06/09/2021   Universal Protocol:    Date/Time: 07/13/2309:07 AM  Consent Given By: the patient  Position: PRONE  Additional Comments: Vital signs were monitored before and after the procedure. Patient was prepped and draped in the usual sterile fashion. The correct patient,  procedure, and site was verified.   Injection Procedure Details:   Procedure diagnoses: Cervical radiculopathy [M54.12]    Meds Administered:  Meds ordered this encounter  Medications   methylPREDNISolone acetate (DEPO-MEDROL) injection 80 mg     Laterality: Left  Location/Site: C7-T1  Needle: 3.5 in., 20 ga. Tuohy  Needle Placement: Paramedian epidural space  Findings:  -Comments: Excellent flow of contrast into the epidural space.  Procedure Details: Using a paramedian approach from the side mentioned above, the region overlying the inferior lamina was localized under fluoroscopic visualization and the soft tissues overlying this structure were infiltrated with 4 ml. of 1% Lidocaine without Epinephrine. A # 20 gauge, Tuohy needle was inserted into the epidural space using a paramedian approach.  The epidural space was localized using loss of resistance along with contralateral oblique bi-planar fluoroscopic views.  After negative aspirate for air, blood, and CSF, a 2 ml. volume of Isovue-250 was injected into the epidural space and the flow of contrast was observed. Radiographs were obtained for documentation purposes.   The injectate was administered into the level noted above.  Additional Comments:  The patient tolerated the procedure well Dressing: 2 x 2 sterile gauze and Band-Aid    Post-procedure details: Patient was observed during the procedure. Post-procedure instructions were reviewed.  Patient left the clinic in stable condition.    Clinical History: MRI CERVICAL SPINE WITHOUT CONTRAST   TECHNIQUE: Multiplanar, multisequence MR imaging of the cervical spine was performed. No intravenous contrast was administered.   COMPARISON:  Cervical spine radiographs 04/28/2020  FINDINGS: Alignment: Normal   Vertebrae: Negative for fracture or mass. Hemangioma T1 vertebral body.   Cord: Normal signal and morphology   Posterior Fossa, vertebral arteries,  paraspinal tissues: Negative   Disc levels:   C2-3: Negative   C3-4: Negative   C4-5: Disc degeneration and spurring asymmetric to the left. Mild left foraminal narrowing. Mild cord flattening on the left with mild spinal stenosis.   C5-6: Disc degeneration and uncinate spurring left greater than right. Bilateral cord flattening left greater than right. Moderate left foraminal stenosis. Right foramen patent. Mild spinal stenosis   C6-7: Disc degeneration with diffuse uncinate spurring asymmetric to the left. Diffuse cord flattening left greater than right with moderate spinal stenosis. Severe left foraminal stenosis due to uncinate spurring.   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 Spurring is asymmetric to the left with left foraminal narrowing at these levels most severe at C6-7. Moderate spinal stenosis at C6-7.     Electronically Signed   By: Franchot Gallo M.D.   On: 05/28/2020 08:19     Objective:  VS:  HT:     WT:    BMI:      BP:123/67   HR:(!) 56bpm   TEMP: ( )   RESP:  Physical Exam Vitals and nursing note reviewed.  Constitutional:      General: She is not in acute distress.    Appearance: Normal appearance. She is not ill-appearing.  HENT:     Head: Normocephalic and atraumatic.     Right Ear: External ear normal.     Left Ear: External ear normal.  Eyes:     Extraocular Movements: Extraocular movements intact.  Cardiovascular:     Rate and Rhythm: Normal rate.     Pulses: Normal pulses.  Musculoskeletal:     Cervical back: Tenderness present. No rigidity.     Right lower leg: No edema.     Left lower leg: No edema.     Comments: Patient has good strength in the upper extremities including 5 out of 5 strength in wrist extension long finger flexion and APB.  There is no atrophy of the hands intrinsically.  There is a negative Hoffmann's test.   Lymphadenopathy:     Cervical: No cervical adenopathy.   Skin:    Findings: No erythema, lesion or rash.  Neurological:     General: No focal deficit present.     Mental Status: She is alert and oriented to person, place, and time.     Sensory: No sensory deficit.     Motor: No weakness or abnormal muscle tone.     Coordination: Coordination normal.  Psychiatric:        Mood and Affect: Mood normal.        Behavior: Behavior normal.     Imaging: No results found.

## 2021-07-20 ENCOUNTER — Ambulatory Visit (INDEPENDENT_AMBULATORY_CARE_PROVIDER_SITE_OTHER): Payer: Medicaid Other | Admitting: Surgical

## 2021-07-20 ENCOUNTER — Other Ambulatory Visit: Payer: Self-pay

## 2021-07-20 DIAGNOSIS — M4802 Spinal stenosis, cervical region: Secondary | ICD-10-CM

## 2021-07-20 DIAGNOSIS — M7501 Adhesive capsulitis of right shoulder: Secondary | ICD-10-CM

## 2021-07-22 ENCOUNTER — Ambulatory Visit (INDEPENDENT_AMBULATORY_CARE_PROVIDER_SITE_OTHER): Payer: Medicaid Other | Admitting: Podiatry

## 2021-07-22 ENCOUNTER — Other Ambulatory Visit: Payer: Self-pay

## 2021-07-22 ENCOUNTER — Ambulatory Visit (INDEPENDENT_AMBULATORY_CARE_PROVIDER_SITE_OTHER): Payer: Medicaid Other

## 2021-07-22 DIAGNOSIS — M7751 Other enthesopathy of right foot: Secondary | ICD-10-CM | POA: Diagnosis not present

## 2021-07-22 DIAGNOSIS — M25571 Pain in right ankle and joints of right foot: Secondary | ICD-10-CM

## 2021-07-22 DIAGNOSIS — M779 Enthesopathy, unspecified: Secondary | ICD-10-CM

## 2021-07-22 MED ORDER — TRIAMCINOLONE ACETONIDE 10 MG/ML IJ SUSP
10.0000 mg | Freq: Once | INTRAMUSCULAR | Status: AC
  Administered 2021-07-22: 10 mg

## 2021-07-22 NOTE — Progress Notes (Signed)
Office Visit Note   Patient: Rhonda Higgins           Date of Birth: 11-26-1970           MRN: [id number] Visit Date: 07/20/2021 Requested by: [name], MD [address] [place],  [place] [address] PCP: [name], MD  Subjective: Chief Complaint  Patient presents with   Right Shoulder - Pain   Left Shoulder - Pain    HPI: Rhonda Higgins is a 51 y.o. female who presents to the office complaining of bilateral shoulder pain.  Patient continues to complain of pain that radiates from her bilateral elbows up to her shoulders into her shoulder blades into her neck and to the base of her occiput.  She has numbness and tingling in both upper extremities, left greater than right.  She has associated neck pain.  Pain is worse at night.  She had recent cervical spine injection with Dr. Ernestina Patches in late December 2022 that has not provided any relief for even a day and caused her to feel like she was having a heart attack for about an hour so she does not want to have any injections like this in the future at least at this point.  She is never had a prior reaction to ESI's or cortisone injections.  She has tried Motrin and Tylenol continually with no relief.  She does have history of diabetes and she is in between seeing her PCP and a new endocrinologist.  Her blood glucose usually runs 200-300 in the morning.  She states in particular she has radiating pain down the left arm that travels from her neck to her left hand and affects the entire left hand both dorsally and palmarly.  She has relief of this pain with tilting her neck to her right side which helps the pain go away..                ROS: All systems reviewed are negative as they relate to the chief complaint within the history of present illness.  Patient denies fevers or chills.  Assessment & Plan: Visit Diagnoses: No diagnosis found.  Plan: Patient is a 52 year old female who presents for repeat evaluation of shoulder  pain.  Now shoulder pain is bothering her in both shoulders with radiation into the scapula and neck as well as radicular pain down the entirety of the left arm with numbness and tingling that is improved with neck range of motion.  Based on her description, impression is that this is still cervical spine mediated.  No significant relief from the last injection and it does seem to have caused a panic attack for her.  She has had previous relief from injection before that.  Discussed options available to patient.  Cortisone injections are not a great option right now as her diabetes is currently not well controlled with her blood glucose between 200-300.  After discussion of options, patient would like to try physical therapy for cervical spine.  She will also try intra-articular glenohumeral injection of the right shoulder.  She has a little bit less range of motion on the right side which may reflect some early adhesive capsulitis.  We will see how she does with this injection.  Follow-up in 6 weeks for clinical recheck with Dr. Marlou Sa.  Follow-Up Instructions: No follow-ups on file.   Orders:  No orders of the defined types were placed in this encounter.  No orders of the defined types were  placed in this encounter.     Procedures: Large Joint Inj: R glenohumeral on 07/20/2021 6:42 PM Indications: diagnostic evaluation and pain Details: 18 G 1.5 in needle, posterior approach  Arthrogram: No  Medications: 9 mL bupivacaine 0.5 %; 5 mL lidocaine 1 % (Marcaine mixed with 1 cc Toradol) Outcome: tolerated well, no immediate complications Procedure, treatment alternatives, risks and benefits explained, specific risks discussed. Consent was given by the patient. Immediately prior to procedure a time out was called to verify the correct patient, procedure, equipment, support staff and site/side marked as required. Patient was prepped and draped in the usual sterile fashion.      Clinical Data: No  additional findings.  Objective: Vital Signs: There were no vitals taken for this visit.  Physical Exam:  Constitutional: Patient appears well-developed HEENT:  Head: Normocephalic Eyes:EOM are normal Neck: Normal range of motion Cardiovascular: Normal rate Pulmonary/chest: Effort normal Neurologic: Patient is alert Skin: Skin is warm Psychiatric: Patient has normal mood and affect  Ortho Exam: Ortho exam demonstrates right shoulder with 50 degrees X rotation, 80 degrees abduction, 120 degrees forward flexion.  Left shoulder with 60 degrees external rotation, 100 degrees abduction, 140 degrees forward flexion.  Excellent rotator cuff strength of supra, infra, subscap of both shoulders with very slight weakness of external rotation rated 5 -/5 of infraspinatus on the right-hand side but otherwise rotator cuff strength rated 5/5.  No significant crepitus noted with passive motion of either shoulder.  5/5 motor strength of bilateral grip strength, finger abduction, pronation/supination, bicep, tricep, deltoid.  Tenderness throughout the axial cervical spine.   Specialty Comments:  No specialty comments available.  Imaging: No results found.   PMFS History: Patient Active Problem List   Diagnosis Date Noted   Iron deficiency anemia 06/24/2021   Status post bronchoscopy 04/20/2021   Pulmonary nodule 1 cm or greater in diameter 03/17/2021   Elevated levels of transaminase & lactic acid dehydrogenase 08/03/2020   Epigastric pain 08/03/2020   Liver nodule 08/03/2020   Obstruction of bile duct 08/03/2020   Cirrhosis of liver ([place]) 07/29/2020   Gastroparesis 07/29/2020   Irritable bowel syndrome 07/29/2020   Steatosis of liver 07/29/2020   MDD (major depressive disorder), recurrent episode, severe ([place]) 04/01/2020   Borderline personality disorder ([place]) 04/01/2020   Intermittent explosive disorder in adult 04/01/2020   Cholelithiasis with chronic cholecystitis 09/06/2017    Multinodular goiter (nontoxic) 11/17/2015   Diabetes mellitus without complication ([place]) 06/11/8249   GERD (gastroesophageal reflux disease) 07/19/2014   HLD (hyperlipidemia) 02/09/2010   Obstructive sleep apnea 02/09/2010   Allergic rhinitis 02/09/2010   History of uterine cancer 02/09/2010   Past Medical History:  Diagnosis Date   Adenocarcinoma ([place])    endometrial, FIGO GRADE 1   Allergic rhinitis    Atypical chest pain    History of   Depression    Elevated liver enzymes    GERD (gastroesophageal reflux disease)    Hematuria    History of endometrial cancer 08-02-2009   oncologist-  dr brewster/ [facility] George and dr kinard/  no recurrence   endometrial adenocarinoma Stage 1B, Grade 1, FIGO--  s/p  TAH w/ BSO and pelvic lymph node dissection's and radiation therapy   History of kidney stones    History of radiation therapy    2011  pelvic intracavity brachytherapy treatment's for endometrial carcinoma   History of thyroid nodule    multinodular goiter s/p  total thyroidectomy 11-19-2015  per pathology -  adenomatoid nodules  Hyperlipidemia    Hypothyroidism, postsurgical    Insulin dependent diabetes mellitus    Type 2   Left ureteral stone    Obesity    OSA (obstructive sleep apnea)    severe OSA  per study 03-08-2010--  noncomplant cpap (no more sleep apnes since lost weight 2019)   Overactive bladder    Personality disorder (HCC)    Polyphagia(783.6)    PONV (postoperative nausea and vomiting)    after ear surgery only one time   Right lower quadrant pain    Urgency of urination    UTI (urinary tract infection)     Family History  Problem Relation Age of Onset   Heart disease Sister    Heart attack Brother    Heart disease Brother    Heart disease Sister    Diabetes Sister    Breast cancer Sister    Asthma Mother    Heart disease Mother    Diabetes Mother    Emphysema Mother    Hypertension Mother    Stroke Mother    Prostate cancer Father     Past Surgical  History:  Procedure Laterality Date   BRONCHIAL BIOPSY  04/18/2021   Procedure: BRONCHIAL BIOPSIES;  Surgeon: Collene Gobble, MD;  Location: [facility] ENDOSCOPY;  Service: Pulmonary;;   BRONCHIAL BRUSHINGS  04/18/2021   Procedure: BRONCHIAL BRUSHINGS;  Surgeon: Collene Gobble, MD;  Location: [facility] ENDOSCOPY;  Service: Pulmonary;;   BRONCHIAL NEEDLE ASPIRATION BIOPSY  04/18/2021   Procedure: BRONCHIAL NEEDLE ASPIRATION BIOPSIES;  Surgeon: Collene Gobble, MD;  Location: [facility] ENDOSCOPY;  Service: Pulmonary;;   CARDIOVASCULAR STRESS TEST  06/09/2008   normal nuclear study w/ no ischemia/  normal LV function and wall motion , ef 83%   CHOLECYSTECTOMY N/A 09/06/2017   Procedure: LAPAROSCOPIC CHOLECYSTECTOMY WITH INTRAOPERATIVE CHOLANGIOGRAM;  Surgeon: Armandina Gemma, MD;  Location: [facility] ORS;  Service: General;  Laterality: N/A;   CYSTOSCOPY/RETROGRADE/URETEROSCOPY/STONE EXTRACTION WITH BASKET Left 03/08/2016   Procedure: CYSTOSCOPY/RETROGRADE/URETEROSCOPY/STONE EXTRACTION WITH BASKET, STENT PLACEMENT;  Surgeon: Rana Snare, MD;  Location: [facility];  Service: Urology;  Laterality: Left;   ENDOMETRIAL BIOPSY     ERCP N/A 09/07/2017   Procedure: ENDOSCOPIC RETROGRADE CHOLANGIOPANCREATOGRAPHY (ERCP);  Surgeon: [name], MD;  Location: [facility] ENDOSCOPY;  Service: Gastroenterology;  Laterality: N/A;   ESOPHAGOGASTRODUODENOSCOPY (EGD) WITH PROPOFOL N/A 03/16/2020   Procedure: ESOPHAGOGASTRODUODENOSCOPY (EGD) WITH PROPOFOL;  Surgeon: [name], MD;  Location: [facility] ENDOSCOPY;  Service: Gastroenterology;  Laterality: N/A;  unable to locate ampulla, aborted ERCP, changed to EGD   HOLMIUM LASER APPLICATION Left 4/43/1540   Procedure: HOLMIUM LASER APPLICATION;  Surgeon: Rana Snare, MD;  [facility]  Service: Urology;  Laterality: Left;   MOUTH SURGERY     MYRINGECOTMY W/ REMOVAL MIDDLE EAR CHOLESTEATOMA TYPE 1 FASICA TYMPANOPLASTY  09/13/2000   ROBOTIC ASSISTED TOTAL HYSTERECTOMY  WITH BILATERAL SALPINGO OOPHERECTOMY  08-02-2009   at [facility]  dr Denman George   w/  Bilateral pelvic and para aortic lymph node dissection's   THYROIDECTOMY N/A 11/19/2015   Procedure: TOTAL THYROIDECTOMY;  Surgeon: Armandina Gemma, MD;  Location: [facility] ORS;  Service: General;  Laterality: N/A;   TONSILLECTOMY  age 28   TRANSTHORACIC ECHOCARDIOGRAM  07/19/2014   ef 55-60%/  trivial TR   TYMPANOPLASTY Right 1993   VIDEO BRONCHOSCOPY WITH ENDOBRONCHIAL NAVIGATION Left 04/18/2021   Procedure: ROBOTIC ASSISTED BRONCHOSCOPY WITH ENDOBRONCHIAL NAVIGATION;  Surgeon: Collene Gobble, MD;  Location: [facility] ENDOSCOPY;  Service: Pulmonary;  Laterality: Left;  VIDEO BRONCHOSCOPY WITH RADIAL ENDOBRONCHIAL ULTRASOUND  04/18/2021   Procedure: RADIAL ENDOBRONCHIAL ULTRASOUND;  Surgeon: Collene Gobble, MD;  Location: [facility] ENDOSCOPY;  Service: Pulmonary;;   Social History   Occupational History   Occupation: n/a  Tobacco Use   Smoking status: Never   Smokeless tobacco: Never  Vaping Use   Vaping Use: Never used  Substance and Sexual Activity   Alcohol use: No   Drug use: No   Sexual activity: Yes    Birth control/protection: None

## 2021-07-22 NOTE — Progress Notes (Signed)
Subjective:   Patient ID: Rhonda Higgins, female   DOB: 51 y.o.   MRN: [id number]   HPI Patient presents stating my right ankle has bothered me for a long time but recently has become much more sore.  Do not remember injury   ROS      Objective:  Physical Exam  Neurovascular status intact with inflammation of the sinus tarsi right that become inflamed over the last several months     Assessment:  Acute capsulitis with inflammation sinus tarsi right     Plan:  Sterile prep injected the sinus tarsi right 3 mg Kenalog 5 mg Xylocaine advised on reduced activity reappoint as needed  X-rays were negative for signs that there appears to be coalition injury or fracture or advanced arthritis

## 2021-07-24 ENCOUNTER — Encounter: Payer: Self-pay | Admitting: Orthopedic Surgery

## 2021-07-24 MED ORDER — LIDOCAINE HCL 1 % IJ SOLN
5.0000 mL | INTRAMUSCULAR | Status: AC | PRN
  Administered 2021-07-20: 5 mL

## 2021-07-24 MED ORDER — BUPIVACAINE HCL 0.5 % IJ SOLN
9.0000 mL | INTRAMUSCULAR | Status: AC | PRN
  Administered 2021-07-20: 9 mL via INTRA_ARTICULAR

## 2021-07-27 ENCOUNTER — Other Ambulatory Visit: Payer: Self-pay | Admitting: Podiatry

## 2021-07-27 DIAGNOSIS — M779 Enthesopathy, unspecified: Secondary | ICD-10-CM

## 2021-08-04 ENCOUNTER — Telehealth: Payer: Self-pay | Admitting: Physician Assistant

## 2021-08-04 NOTE — Telephone Encounter (Signed)
Pt called and states that she will not be able to do PT because she can not afford it. She will be going to the first appt and that's it.   CB [phone]

## 2021-08-04 NOTE — Telephone Encounter (Signed)
Sorry to hear that but I think that works out well as long as she can try keeping up with a HEP

## 2021-08-05 NOTE — Telephone Encounter (Signed)
Patient aware of the below message  

## 2021-08-08 ENCOUNTER — Telehealth: Payer: Self-pay | Admitting: Surgical

## 2021-08-08 NOTE — Telephone Encounter (Signed)
Pt called stating she can go to physical therapy and please send referral. Pt phone number is [phone].

## 2021-08-08 NOTE — Telephone Encounter (Signed)
Okay for this per last note. PT for C-spine 1x/week for 4-6 weeks with HEP

## 2021-08-09 NOTE — Telephone Encounter (Signed)
FYI IC spoke with patient. She stated she actually did not need order for PT but wanted to let us know that she had started PT because she found out she would not have to pay a copay. Her first session was yesterday.

## 2021-08-09 NOTE — Telephone Encounter (Signed)
Great, thanks for the info

## 2021-08-27 ENCOUNTER — Other Ambulatory Visit: Payer: Self-pay

## 2021-08-27 ENCOUNTER — Emergency Department (HOSPITAL_COMMUNITY)
Admission: EM | Admit: 2021-08-27 | Discharge: 2021-08-28 | Disposition: A | Discharge status: Home / Self Care | Payer: Medicaid Other | Attending: Emergency Medicine | Admitting: Emergency Medicine

## 2021-08-27 ENCOUNTER — Encounter (HOSPITAL_COMMUNITY): Payer: Self-pay | Admitting: *Deleted

## 2021-08-27 DIAGNOSIS — E1165 Type 2 diabetes mellitus with hyperglycemia: Secondary | ICD-10-CM | POA: Insufficient documentation

## 2021-08-27 DIAGNOSIS — R739 Hyperglycemia, unspecified: Secondary | ICD-10-CM | POA: Diagnosis present

## 2021-08-27 DIAGNOSIS — Z794 Long term (current) use of insulin: Secondary | ICD-10-CM | POA: Insufficient documentation

## 2021-08-27 DIAGNOSIS — Z7984 Long term (current) use of oral hypoglycemic drugs: Secondary | ICD-10-CM | POA: Insufficient documentation

## 2021-08-27 LAB — URINALYSIS, ROUTINE W REFLEX MICROSCOPIC
Bacteria, UA: NONE SEEN
Bilirubin Urine: NEGATIVE
Glucose, UA: >=500 mg/dL — AB
Hgb urine dipstick: NEGATIVE
Ketones, ur: NEGATIVE mg/dL
Leukocytes,Ua: NEGATIVE
Nitrite: NEGATIVE
Protein, ur: NEGATIVE mg/dL
Specific Gravity, Urine: 1.027 (ref 1.005–1.030)
pH: 7 (ref 5.0–8.0)

## 2021-08-27 LAB — CBG MONITORING, ED: Glucose-Capillary: 365 mg/dL — ABNORMAL HIGH (ref 70–99)

## 2021-08-27 MED ORDER — SODIUM CHLORIDE 0.9 % IV BOLUS
1000.0000 mL | Freq: Once | INTRAVENOUS | Status: AC
  Administered 2021-08-27: 1000 mL via INTRAVENOUS

## 2021-08-27 NOTE — ED Provider Notes (Signed)
[facility] DEPT Provider Note   CSN: [id number] Arrival date & time: 08/27/21  2131     History  Chief Complaint  Patient presents with   Hyperglycemia    Rhonda Higgins is a 51 y.o. female.  The history is provided by the patient and medical records.  Hyperglycemia Associated symptoms: increased thirst and polyuria    51 year old female with history of depression, kidney stones, obesity, GERD, hyperlipidemia, diabetes, personality disorder, presenting to the ED for hyperglycemia.  States her sugar has been running high for several months now.  Her medication was changed in January due to a lot of nausea and vomiting from her other medications.  States her sugar has not been well controlled on these so PCP is setting her up with endocrinology but cannot see them until next month.  States sugars have been running in the 400s consistently.  She does admit to frequent thirst and urination, drinking tons of water.  States she has been watching her diet closely in regards to carbs/sugar but CBG still will not go down.  No missed doses of insulin or other medications.  Home Medications Prior to Admission medications   Medication Sig Start Date End Date Taking? Authorizing Provider  ACCU-CHEK GUIDE test strip  03/23/20   [provider]  Accu-Chek Softclix Lancets lancets daily. 07/21/20   [provider]  atorvastatin (LIPITOR) 40 MG tablet Take 40 mg by mouth daily. 08/22/20   [provider]  buPROPion (WELLBUTRIN XL) 300 MG 24 hr tablet Take 300 mg by mouth every morning. 10/19/20   [provider]  buPROPion (WELLBUTRIN XL) 300 MG 24 hr tablet 1 tablet in the morning    [provider]  Cetirizine HCl (ZYRTEC ALLERGY) 10 MG CAPS Take 10 mg by mouth daily as needed (allergy).    [provider]  ciclopirox (PENLAC) 8 % solution Apply one coat to affected toenails once daily for 48 weeks. Remove once  weekly with nail polish remover. 03/29/21   Marzetta Board, DPM  ferrous sulfate 325 (65 FE) MG EC tablet Take 325 mg by mouth daily. Once a day    [provider]  gabapentin (NEURONTIN) 400 MG capsule Take 400-1,200 mg by mouth See admin instructions. Takes 400 mg in the morning 400 mg in the afternoon and 1200 mg at night    [provider]  glimepiride (AMARYL) 4 MG tablet Take 4 mg by mouth 2 (two) times daily.     [provider]  hydrOXYzine (ATARAX/VISTARIL) 25 MG tablet Take 1 tablet (25 mg total) by mouth 3 (three) times daily as needed for anxiety. 04/04/20   Connye Burkitt, NP  ibuprofen (ADVIL) 200 MG tablet Take 400 mg by mouth every 6 (six) hours as needed for mild pain or moderate pain. Motrin    [provider]  Ibuprofen (MOTRIN PO) Take by mouth.    [provider]  JANUMET 50-1000 MG tablet Take 1 tablet by mouth 2 (two) times daily. 05/24/20   [provider]  LANTUS SOLOSTAR 100 UNIT/ML Solostar Pen Inject 55 Units into the skin 2 (two) times daily. 04/10/20   [provider]  linagliptin (TRADJENTA) 5 MG TABS tablet Take 1 tablet (5 mg total) by mouth daily with breakfast. 04/02/20   Money, Lowry Ram, FNP  liraglutide (VICTOZA) 18 MG/3ML SOPN Inject 1.8 mg into the skin daily. 04/02/20   Money, Lowry Ram, FNP  lisinopril (PRINIVIL,ZESTRIL) 5 MG  tablet Take 5 mg by mouth daily.    [provider]  metFORMIN (GLUCOPHAGE) 1000 MG tablet Take 1 tablet (1,000 mg total) by mouth 2 (two) times daily with a meal. 04/01/20   Money, Lowry Ram, FNP  ondansetron (ZOFRAN-ODT) 4 MG disintegrating tablet Take 4 mg by mouth 2 (two) times daily as needed for nausea/vomiting. 02/25/21   [provider]  pantoprazole (PROTONIX) 40 MG tablet Take 40 mg by mouth 2 (two) times daily.     [provider]  Peppermint Oil (IBGARD) 90 MG CPCR 2 capsules 12/08/19   [provider]  polyethylene  glycol-electrolytes (NULYTELY) 420 g solution Take 4,000 mLs by mouth once. 11/13/18   [provider]  risperiDONE (RISPERDAL) 2 MG tablet Take 2 mg by mouth at bedtime.  08/22/16   [provider]  sucralfate (CARAFATE) 1 g tablet Take 1 g by mouth 3 (three) times daily. 10/28/20   [provider]  SYNTHROID 175 MCG tablet Take 175 mcg by mouth daily. 07/27/20   [provider]  traZODone (DESYREL) 50 MG tablet Take 1 tablet (50 mg total) by mouth at bedtime. 04/18/21   Collene Gobble, MD  Vitamin D, Cholecalciferol, 25 MCG (1000 UT) TABS Take 1,000 Units by mouth daily.    [provider]  Vitamin D, Cholecalciferol, 25 MCG (1000 UT) TABS 1 tablet    [provider]      Allergies    Hydrocodone, Tape, Other, and Codeine    Review of Systems   Review of Systems  Endocrine: Positive for polydipsia and polyuria.  All other systems reviewed and are negative.  Physical Exam Updated Vital Signs BP 121/74    Pulse 71    Temp 97.8 F (36.6 C) (Oral)    Resp 18    SpO2 100%   Physical Exam Vitals and nursing note reviewed.  Constitutional:      Appearance: She is well-developed.  HENT:     Head: Normocephalic and atraumatic.  Eyes:     Conjunctiva/sclera: Conjunctivae normal.     Pupils: Pupils are equal, round, and reactive to light.  Cardiovascular:     Rate and Rhythm: Normal rate and regular rhythm.     Heart sounds: Normal heart sounds.  Pulmonary:     Effort: Pulmonary effort is normal. No respiratory distress.     Breath sounds: Normal breath sounds. No rhonchi.  Abdominal:     General: Bowel sounds are normal.     Palpations: Abdomen is soft.     Tenderness: There is no abdominal tenderness. There is no rebound.  Musculoskeletal:        General: Normal range of motion.     Cervical back: Normal range of motion.  Skin:    General: Skin is warm and dry.  Neurological:     Mental Status: She is alert and oriented to  person, place, and time.    ED Results / Procedures / Treatments   Labs (all labs ordered are listed, but only abnormal results are displayed) Labs Reviewed  CBC WITH DIFFERENTIAL/PLATELET - Abnormal; Notable for the following components:      Result Value   WBC 3.5 (*)    Platelets 79 (*)    All other components within normal limits  BASIC METABOLIC PANEL - Abnormal; Notable for the following components:   Potassium 3.4 (*)    Glucose, Bld 347 (*)    Calcium 8.6 (*)    All other components  within normal limits  BLOOD GAS, VENOUS - Abnormal; Notable for the following components:   pH, Ven 7.45 (*)    pCO2, Ven 40 (*)    pO2, Ven 89 (*)    Acid-Base Excess 3.5 (*)    All other components within normal limits  URINALYSIS, ROUTINE W REFLEX MICROSCOPIC - Abnormal; Notable for the following components:   Glucose, UA >=500 (*)    All other components within normal limits  CBG MONITORING, ED - Abnormal; Notable for the following components:   Glucose-Capillary 365 (*)    All other components within normal limits  CBG MONITORING, ED - Abnormal; Notable for the following components:   Glucose-Capillary 274 (*)    All other components within normal limits    EKG None  Radiology No results found.  Procedures Procedures    Medications Ordered in ED Medications  sodium chloride 0.9 % bolus 1,000 mL (0 mLs Intravenous Stopped 08/28/21 0059)    ED Course/ Medical Decision Making/ A&P                           Medical Decision Making Amount and/or Complexity of Data Reviewed Labs: ordered. ECG/medicine tests: ordered and independent interpretation performed.   51 year old female presenting to the ED with hyperglycemia.  Her insulin had to be changed in January due to adverse side effects.  States has been having trouble controlling her sugars since that time.  PCP has referred her to endocrinology, cannot see them until next month.  She is afebrile and nontoxic.  Her vitals  are stable.  Does admit to frequent thirst and urination.  Will check labs, VBG, UA.  Given IV fluids.  Labs as above, hyperglycemia without findings of DKA.  No ketones in the urine.  Normal anion gap and bicarb.  pH also WNL.  Repeat CBG continuing to improve.  Feel she is stable for discharge home.  We will have her continue insulin regimen and follow-up closely with primary care, may need further adjustments to her insulin regimen.  Can return here for any new or acute changes.  Final Clinical Impression(s) / ED Diagnoses Final diagnoses:  Hyperglycemia    Rx / DC Orders ED Discharge Orders     None         Larene Pickett, PA-C 08/28/21 0146    Carmin Muskrat, MD 08/28/21 1500

## 2021-08-27 NOTE — ED Triage Notes (Addendum)
Pt states her glucose level has been in the 400's today. Has been taking her medications as prescribed, did have a change to her medications in January.  ?

## 2021-08-28 LAB — CBC WITH DIFFERENTIAL/PLATELET
Abs Immature Granulocytes: 0.04 10*3/uL (ref 0.00–0.07)
Basophils Absolute: 0 10*3/uL (ref 0.0–0.1)
Basophils Relative: 1 %
Eosinophils Absolute: 0.1 10*3/uL (ref 0.0–0.5)
Eosinophils Relative: 2 %
HCT: 41.5 % (ref 36.0–46.0)
Hemoglobin: 13.8 g/dL (ref 12.0–15.0)
Immature Granulocytes: 1 %
Lymphocytes Relative: 33 %
Lymphs Abs: 1.1 10*3/uL (ref 0.7–4.0)
MCH: 29.4 pg (ref 26.0–34.0)
MCHC: 33.3 g/dL (ref 30.0–36.0)
MCV: 88.3 fL (ref 80.0–100.0)
Monocytes Absolute: 0.3 10*3/uL (ref 0.1–1.0)
Monocytes Relative: 8 %
Neutro Abs: 1.9 10*3/uL (ref 1.7–7.7)
Neutrophils Relative %: 55 %
Platelets: 79 10*3/uL — ABNORMAL LOW (ref 150–400)
RBC: 4.7 MIL/uL (ref 3.87–5.11)
RDW: 13 % (ref 11.5–15.5)
Smear Review: DECREASED
WBC: 3.5 10*3/uL — ABNORMAL LOW (ref 4.0–10.5)
nRBC: 0 % (ref 0.0–0.2)

## 2021-08-28 LAB — BASIC METABOLIC PANEL
Anion gap: 6 (ref 5–15)
BUN: 12 mg/dL (ref 6–20)
CO2: 25 mmol/L (ref 22–32)
Calcium: 8.6 mg/dL — ABNORMAL LOW (ref 8.9–10.3)
Chloride: 104 mmol/L (ref 98–111)
Creatinine, Ser: 0.47 mg/dL (ref 0.44–1.00)
GFR, Estimated: >60 mL/min (ref >60)
Glucose, Bld: 347 mg/dL — ABNORMAL HIGH (ref 70–99)
Potassium: 3.4 mmol/L — ABNORMAL LOW (ref 3.5–5.1)
Sodium: 135 mmol/L (ref 135–145)

## 2021-08-28 LAB — BLOOD GAS, VENOUS
Acid-Base Excess: 3.5 mmol/L — ABNORMAL HIGH (ref 0.0–2.0)
Bicarbonate: 27.8 mmol/L (ref 20.0–28.0)
O2 Saturation: 99.5 %
Patient temperature: 37
pCO2, Ven: 40 mmHg — ABNORMAL LOW (ref 44–60)
pH, Ven: 7.45 — ABNORMAL HIGH (ref 7.25–7.43)
pO2, Ven: 89 mmHg — ABNORMAL HIGH (ref 32–45)

## 2021-08-28 LAB — CBG MONITORING, ED: Glucose-Capillary: 274 mg/dL — ABNORMAL HIGH (ref 70–99)

## 2021-08-28 NOTE — Discharge Instructions (Signed)
Sugars today are still a little high but improving.  You are not in DKA or diabetic crisis.  Continue your insulin. ?Continue to watch your sugars and carb intake. ?Follow-up with your primary care doctor. ?Return here for any new/acute changes. ?

## 2021-08-28 NOTE — ED Notes (Signed)
Pt NAD, a/ox4. Pt verbalizes understanding of all DC and f/u instructions. All questions answered. Pt walks with steady gait to lobby at DC.  ? ?

## 2021-08-30 ENCOUNTER — Telehealth: Payer: Self-pay | Admitting: Hematology and Oncology

## 2021-08-30 NOTE — Telephone Encounter (Signed)
Scheduled appt per 3/13 referral. Pt is aware of appt date and time. Pt is aware to arrive 15 mins prior to appt time and to bring and updated insurance card. Pt is aware of appt location.   ?

## 2021-08-31 ENCOUNTER — Ambulatory Visit (INDEPENDENT_AMBULATORY_CARE_PROVIDER_SITE_OTHER): Payer: Medicaid Other | Admitting: Orthopedic Surgery

## 2021-08-31 ENCOUNTER — Other Ambulatory Visit: Payer: Self-pay

## 2021-08-31 ENCOUNTER — Encounter: Payer: Self-pay | Admitting: Orthopedic Surgery

## 2021-08-31 DIAGNOSIS — M7501 Adhesive capsulitis of right shoulder: Secondary | ICD-10-CM | POA: Diagnosis not present

## 2021-08-31 DIAGNOSIS — M5412 Radiculopathy, cervical region: Secondary | ICD-10-CM

## 2021-08-31 MED ORDER — METHOCARBAMOL 500 MG PO TABS: 500.0000 mg | ORAL_TABLET | Freq: Three times a day (TID) | ORAL | 0 refills | Status: DC | PRN

## 2021-08-31 MED ORDER — PREDNISONE 5 MG (21) PO TBPK: ORAL_TABLET | ORAL | 0 refills | Status: DC

## 2021-08-31 NOTE — Progress Notes (Signed)
? ?Office Visit Note ?  ?Patient: Rhonda Higgins           ?Date of Birth: 1971-05-07           ?MRN: [id number] ?Visit Date: 08/31/2021 ?Requested by: [name], MD ?[place],  [place] [id number] ?PCP: [name], MD ? ?Subjective: ?Chief Complaint  ?Patient presents with  ? Other  ?   ?Follow up bilateral shoulder pain  ? ? ?HPI: Rhonda Higgins is a 51 year old patient with bilateral shoulder pain.  She is having more radicular symptoms now in the right upper extremity with some numbness and tingling radiating down to the hand.  Also reports symptoms when she rotates her head to the right.  Physical therapy has started to work on her neck as well.  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  Those symptoms have improved and now its mostly on the right-hand side.  Tried injections at that time but had adverse reactions and does not want to do injections again.  Overall her shoulder is improving. ?             ?ROS: All systems reviewed are negative as they relate to the chief complaint within the history of present illness.  Patient denies  fevers or chills. ? ? ?Assessment & Plan: ?Visit Diagnoses:  ?1. Adhesive capsulitis of right shoulder   ?2. Cervical radiculopathy   ? ? ?Plan: Impression is adhesive capsulitis right shoulder which is improving.  Today looks like more of a cervical radiculopathy picture.  Plan is Medrol Dosepak 6-day course with Robaxin.  Therapy prescription provided for them to consider modalities and cervical traction.  If that does not help then her next option would be consideration of injections or surgery. ? ?Follow-Up Instructions: Return if symptoms worsen or fail to improve.  ? ?Orders:  ?No orders of the defined types were placed in this encounter. ? ?Meds ordered this encounter  ?Medications  ? predniSONE (STERAPRED UNI-PAK 21 TAB) 5 MG (21) TBPK tablet  ?  Sig: Take dosepak as directed  ?   Dispense:  21 tablet  ?  Refill:  0  ? methocarbamol (ROBAXIN) 500 MG tablet  ?  Sig: Take 1 tablet (500 mg total) by mouth every 8 (eight) hours as needed for muscle spasms.  ?  Dispense:  30 tablet  ?  Refill:  0  ? ? ? ? Procedures: ?No procedures performed ? ? ?Clinical Data: ?No additional findings. ? ?Objective: ?Vital Signs: There were no vitals taken for this visit. ? ?Physical Exam:  ? ?Constitutional: Patient appears well-developed ?HEENT:  ?Head: Normocephalic ?Eyes:EOM are normal ?Neck: Normal range of motion ?Cardiovascular: Normal rate ?Pulmonary/chest: Effort normal ?Neurologic: Patient is alert ?Skin: Skin is warm ?Psychiatric: Patient has normal mood and affect ? ? ?Ortho Exam: Ortho exam demonstrates pretty good cervical spine range of motion with flexion chin to chest extension and 40 degrees rotation is 60 degrees bilaterally.  Shoulder range of motion on the right passive is 60/90/160.  Rotator cuff strength is intact.  No coarse grinding or crepitus with internal/external rotation of that right arm.  No paresthesias C5-T1 with symmetric reflexes 0 to 1+ out of 4 bilateral biceps and triceps.  Radial pulses intact.  No masses lymphadenopathy or skin changes noted in the shoulder or neck region. ? ?Specialty Comments:  ?No specialty comments available. ? ?Imaging: ?No results found. ? ? ?PMFS History: ?  Patient Active Problem List  ? Diagnosis Date Noted  ? Iron deficiency anemia 06/24/2021  ? Status post bronchoscopy 04/20/2021  ? Pulmonary nodule 1 cm or greater in diameter 03/17/2021  ? Elevated levels of transaminase & lactic acid dehydrogenase 08/03/2020  ? Epigastric pain 08/03/2020  ? Liver nodule 08/03/2020  ? Obstruction of bile duct 08/03/2020  ? Cirrhosis of liver ([place]) 07/29/2020  ? Gastroparesis 07/29/2020  ? Irritable bowel syndrome 07/29/2020  ? Steatosis of liver 07/29/2020  ? MDD (major depressive disorder), recurrent episode, severe ([place]) 04/01/2020  ? Borderline personality  disorder ([place]) 04/01/2020  ? Intermittent explosive disorder in adult 04/01/2020  ? Cholelithiasis with chronic cholecystitis 09/06/2017  ? Multinodular goiter (nontoxic) 11/17/2015  ? Diabetes mellitus without complication ([place]) 78/29/5621  ? GERD (gastroesophageal reflux disease) 07/19/2014  ? HLD (hyperlipidemia) 02/09/2010  ? Obstructive sleep apnea 02/09/2010  ? Allergic rhinitis 02/09/2010  ? History of uterine cancer 02/09/2010  ? ?Past Medical History:  ?Diagnosis Date  ? Adenocarcinoma ([place])   ? endometrial, FIGO GRADE 1  ? Allergic rhinitis   ? Atypical chest pain   ? History of  ? Depression   ? Elevated liver enzymes   ? GERD (gastroesophageal reflux disease)   ? Hematuria   ? History of endometrial cancer 08-02-2009   oncologist-  dr brewster/ [facility] George and dr kinard/  no recurrence  ? endometrial adenocarinoma Stage 1B, Grade 1, FIGO--  s/p  TAH w/ BSO and pelvic lymph node dissection's and radiation therapy  ? History of kidney stones   ? History of radiation therapy   ? 2011  pelvic intracavity brachytherapy treatment's for endometrial carcinoma  ? History of thyroid nodule   ? multinodular goiter s/p  total thyroidectomy 11-19-2015  per pathology -  adenomatoid nodules  ? Hyperlipidemia   ? Hypothyroidism, postsurgical   ? Insulin dependent diabetes mellitus   ? Type 2  ? Left ureteral stone   ? Obesity   ? OSA (obstructive sleep apnea)   ? severe OSA  per study 03-08-2010--  noncomplant cpap (no more sleep apnes since lost weight 2019)  ? Overactive bladder   ? Personality disorder ([place])   ? Polyphagia(783.6)   ? PONV (postoperative nausea and vomiting)   ? after ear surgery only one time  ? Right lower quadrant pain   ? Urgency of urination   ? UTI (urinary tract infection)   ?  ?Family History  ?Problem Relation Age of Onset  ? Heart disease Sister   ? Heart attack Brother   ? Heart disease Brother   ? Heart disease Sister   ? Diabetes Sister   ? Breast cancer Sister   ? Asthma Mother   ? Heart  disease Mother   ? Diabetes Mother   ? Emphysema Mother   ? Hypertension Mother   ? Stroke Mother   ? Prostate cancer Father   ?  ?Past Surgical History:  ?Procedure Laterality Date  ? BRONCHIAL BIOPSY  04/18/2021  ? Procedure: BRONCHIAL BIOPSIES;  Surgeon: Collene Gobble, MD;  [facility] END[facility]COPY;  Service: Pulmonary;;  ? BRONCHIAL BRUSHINGS  04/18/2021  ? Procedure: BRONCHIAL BRUSHINGS;  Surgeon: Collene Gobble, MD;  Location: [facility] ENDOSCOPY;  Service: Pulmonary;;  ? BRONCHIAL NEEDLE ASPIRATION BIOPSY  04/18/2021  ? Procedure: BRONCHIAL NEEDLE ASPIRATION BIOPSIES;  Surgeon: Collene Gobble, MD;  Location: [facility] ENDOSCOPY;  Service: Pulmonary;;  ? CARDIOVASCULAR STRESS TEST  06/09/2008  ? normal nuclear study w/ no ischemia/  normal LV function and wall motion , ef 83%  ? CHOLECYSTECTOMY N/A 09/06/2017  ? Procedure: LAPAROSCOPIC CHOLECYSTECTOMY WITH INTRAOPERATIVE CHOLANGIOGRAM;  Surgeon: Armandina Gemma, MD;  Location: [facility] ORS;  Service: General;  Laterality: N/A;  ? CYSTOSCOPY/RETROGRADE/URETEROSCOPY/STONE EXTRACTION WITH BASKET Left 03/08/2016  ? Procedure: CYSTOSCOPY/RETROGRADE/URETEROSCOPY/STONE EXTRACTION WITH BASKET, STENT PLACEMENT;  Surgeon: Rana Snare, MD;  Location: [facility];  Service: Urology;  Laterality: Left;  ? ENDOMETRIAL BIOPSY    ? ERCP N/A 09/07/2017  ? Procedure: ENDOSCOPIC RETROGRADE CHOLANGIOPANCREATOGRAPHY (ERCP);  Surgeon: [name], MD;  Location: [facility] ENDOSCOPY;  Service: Gastroenterology;  Laterality: N/A;  ? ESOPHAGOGASTRODUODENOSCOPY (EGD) WITH PROPOFOL N/A 03/16/2020  ? Procedure: ESOPHAGOGASTRODUODENOSCOPY (EGD) WITH PROPOFOL;  Surgeon: [name], MD;  Location: [facility] ENDOSCOPY;  Service: Gastroenterology;  Laterality: N/A;  unable to locate ampulla, aborted ERCP, changed to EGD  ? HOLMIUM LASER APPLICATION Left 2/35/3614  ? Procedure: HOLMIUM LASER APPLICATION;  Surgeon: Rana Snare, MD;  Location: [facility];  Service: Urology;  Laterality: Left;  ?  MOUTH SURGERY    ? MYRINGECOTMY W/ REMOVAL MIDDLE EAR CHOLESTEATOMA TYPE 1 FASICA TYMPANOPLASTY  09/13/2000  ? ROBOTIC ASSISTED TOTAL HYSTERECTOMY WITH BILATERAL SALPINGO OOPHERECTOMY  08-02-2009   at [facility]  dr Denman George  ?

## 2021-09-07 ENCOUNTER — Telehealth: Payer: Self-pay | Admitting: Orthopedic Surgery

## 2021-09-07 NOTE — Telephone Encounter (Signed)
Pt called requesting for a call back from [employer]. Or Dr. Marlou Sa. Pt states she cant take the steroid makes her sugar high and had Pt today and right arm and hand id numb. Please call pt about this matter at [phone]. ?

## 2021-09-08 NOTE — Progress Notes (Signed)
[facility] ?CONSULT NOTE ? ?Patient Care Team: ?[name], MD as PCP - General (Family Medicine) ? ?CHIEF COMPLAINTS/PURPOSE OF CONSULTATION:  ?Thrombocytopenia ? ?HISTORY OF PRESENTING ILLNESS:  ?Rhonda Higgins 51 y.o. female is here because of recent diagnosis of Thrombocytopenia. She presents to the clinic today for consult and labs.  She has had longstanding thrombocytopenia due to cirrhosis of the liver and has had occasional intermittent bleeding symptoms especially in the urine.  She also complains of intermittent bruising. ? ?I reviewed her records extensively and collaborated the history with the patient. ?  ? ? ?MEDICAL HISTORY:  ?Past Medical History:  ?Diagnosis Date  ? Adenocarcinoma ([place])   ? endometrial, FIGO GRADE 1  ? Allergic rhinitis   ? Atypical chest pain   ? History of  ? Depression   ? Elevated liver enzymes   ? GERD (gastroesophageal reflux disease)   ? Hematuria   ? History of endometrial cancer 08-02-2009   oncologist-  dr brewster/ [facility] George and dr kinard/  no recurrence  ? endometrial adenocarinoma Stage 1B, Grade 1, FIGO--  s/p  TAH w/ BSO and pelvic lymph node dissection's and radiation therapy  ? History of kidney stones   ? History of radiation therapy   ? 2011  pelvic intracavity brachytherapy treatment's for endometrial carcinoma  ? History of thyroid nodule   ? multinodular goiter s/p  total thyroidectomy 11-19-2015  per pathology -  adenomatoid nodules  ? Hyperlipidemia   ? Hypothyroidism, postsurgical   ? Insulin dependent diabetes mellitus   ? Type 2  ? Left ureteral stone   ? Obesity   ? OSA (obstructive sleep apnea)   ? severe OSA  per study 03-08-2010--  noncomplant cpap (no more sleep apnes since lost weight 2019)  ? Overactive bladder   ? Personality disorder ([place])   ? Polyphagia(783.6)   ? PONV (postoperative nausea and vomiting)   ? after ear surgery only one time  ? Right lower quadrant pain   ? Urgency of urination   ? UTI (urinary tract  infection)   ? ? ?SURGICAL HISTORY: ?Past Surgical History:  ?Procedure Laterality Date  ? BRONCHIAL BIOPSY  04/18/2021  ? Procedure: BRONCHIAL BIOPSIES;  Surgeon: Collene Gobble, MD;  Location: [facility] ENDOSCOPY;  Service: Pulmonary;;  ? BRONCHIAL BRUSHINGS  04/18/2021  ? Procedure: BRONCHIAL BRUSHINGS;  Surgeon: Collene Gobble, MD;  Location: [facility] ENDOSCOPY;  Service: Pulmonary;;  ? BRONCHIAL NEEDLE ASPIRATION BIOPSY  04/18/2021  ? Procedure: BRONCHIAL NEEDLE ASPIRATION BIOPSIES;  Surgeon: Collene Gobble, MD;  Location: [facility] ENDOSCOPY;  Service: Pulmonary;;  ? CARDIOVASCULAR STRESS TEST  06/09/2008  ? normal nuclear study w/ no ischemia/  normal LV function and wall motion , ef 83%  ? CHOLECYSTECTOMY N/A 09/06/2017  ? Procedure: LAPAROSCOPIC CHOLECYSTECTOMY WITH INTRAOPERATIVE CHOLANGIOGRAM;  Surgeon: Armandina Gemma, MD;  Location: [facility] ORS;  Service: General;  Laterality: N/A;  ? CYSTOSCOPY/RETROGRADE/URETEROSCOPY/STONE EXTRACTION WITH BASKET Left 03/08/2016  ? Procedure: CYSTOSCOPY/RETROGRADE/URETEROSCOPY/STONE EXTRACTION WITH BASKET, STENT PLACEMENT;  Surgeon: Rana Snare, MD;  Location: [facility];  Service: Urology;  Laterality: Left;  ? ENDOMETRIAL BIOPSY    ? ERCP N/A 09/07/2017  ? Procedure: ENDOSCOPIC RETROGRADE CHOLANGIOPANCREATOGRAPHY (ERCP);  Surgeon: [name], MD;  Location: [facility] ENDOSCOPY;  Service: Gastroenterology;  Laterality: N/A;  ? ESOPHAGOGASTRODUODENOSCOPY (EGD) WITH PROPOFOL N/A 03/16/2020  ? Procedure: ESOPHAGOGASTRODUODENOSCOPY (EGD) WITH PROPOFOL;  Surgeon: [name], MD;  Location: [facility] ENDOSCOPY;  Service: Gastroenterology;  Laterality: N/A;  unable to  locate ampulla, aborted ERCP, changed to EGD  ? HOLMIUM LASER APPLICATION Left 1/61/0960  ? Procedure: HOLMIUM LASER APPLICATION;  Surgeon: Rana Snare, MD;  Location: [facility];  Service: Urology;  Laterality: Left;  ? MOUTH SURGERY    ? MYRINGECOTMY W/ REMOVAL MIDDLE EAR CHOLESTEATOMA TYPE 1 FASICA TYMPANOPLASTY   09/13/2000  ? ROBOTIC ASSISTED TOTAL HYSTERECTOMY WITH BILATERAL SALPINGO OOPHERECTOMY  08-02-2009   at [facility]  dr Denman George  ? w/  Bilateral pelvic and para aortic lymph node dissection's  ? THYROIDECTOMY N/A 11/19/2015  ? Procedure: TOTAL THYROIDECTOMY;  Surgeon: Armandina Gemma, MD;  Location: [facility] ORS;  Service: General;  Laterality: N/A;  ? TONSILLECTOMY  age 46  ? TRANSTHORACIC ECHOCARDIOGRAM  07/19/2014  ? ef 55-60%/  trivial TR  ? TYMPANOPLASTY Right 1993  ? VIDEO BRONCHOSCOPY WITH ENDOBRONCHIAL NAVIGATION Left 04/18/2021  ? Procedure: ROBOTIC ASSISTED BRONCHOSCOPY WITH ENDOBRONCHIAL NAVIGATION;  Surgeon: Collene Gobble, MD;  Location: [facility] ENDOSCOPY;  Service: Pulmonary;  Laterality: Left;  ? VIDEO BRONCHOSCOPY WITH RADIAL ENDOBRONCHIAL ULTRASOUND  04/18/2021  ? Procedure: RADIAL ENDOBRONCHIAL ULTRASOUND;  Surgeon: Collene Gobble, MD;  Location: [facility] ENDOSCOPY;  Service: Pulmonary;;  ? ? ?SOCIAL HISTORY: ?Social History  ? ?Socioeconomic History  ? Marital status: Married  ?  Spouse name: Not on file  ? Number of children: N  ? Years of education: 10  ? Highest education level: 10th grade  ?Occupational History  ? Occupation: n/a  ?Tobacco Use  ? Smoking status: Never  ? Smokeless tobacco: Never  ?Vaping Use  ? Vaping Use: Never used  ?Substance and Sexual Activity  ? Alcohol use: No  ? Drug use: No  ? Sexual activity: Yes  ?  Birth control/protection: None  ?Other Topics Concern  ? Not on file  ?Social History Narrative  ? Lives with partner, [name].  ? ?Social Determinants of Health  ? ?Financial Resource Strain: Not on file  ?Food Insecurity: Not on file  ?Transportation Needs: Not on file  ?Physical Activity: Not on file  ?Stress: Not on file  ?Social Connections: Not on file  ?Intimate Partner Violence: Not on file  ? ? ?FAMILY HISTORY: ?Family History  ?Problem Relation Age of Onset  ? Heart disease Sister   ? Heart attack Brother   ? Heart disease Brother   ? Heart disease Sister   ? Diabetes Sister   ? Breast  cancer Sister   ? Asthma Mother   ? Heart disease Mother   ? Diabetes Mother   ? Emphysema Mother   ? Hypertension Mother   ? Stroke Mother   ? Prostate cancer Father   ? ? ?ALLERGIES:  is allergic to hydrocodone, tape, other, and codeine. ? ?MEDICATIONS:  ?Current Outpatient Medications  ?Medication Sig Dispense Refill  ? ACCU-CHEK GUIDE test strip     ? Accu-Chek Softclix Lancets lancets daily.    ? atorvastatin (LIPITOR) 40 MG tablet Take 40 mg by mouth daily.    ? buPROPion (WELLBUTRIN XL) 300 MG 24 hr tablet Take 300 mg by mouth every morning.    ? buPROPion (WELLBUTRIN XL) 300 MG 24 hr tablet 1 tablet in the morning    ? Cetirizine HCl (ZYRTEC ALLERGY) 10 MG CAPS Take 10 mg by mouth daily as needed (allergy).    ? ciclopirox (PENLAC) 8 % solution Apply one coat to affected toenails once daily for 48 weeks. Remove once weekly with nail polish remover. 6.6 mL 11  ? ferrous sulfate  325 (65 FE) MG EC tablet Take 325 mg by mouth daily. Once a day    ? gabapentin (NEURONTIN) 400 MG capsule Take 400-1,200 mg by mouth See admin instructions. Takes 400 mg in the morning 400 mg in the afternoon and 1200 mg at night    ? glimepiride (AMARYL) 4 MG tablet Take 4 mg by mouth 2 (two) times daily.   5  ? hydrOXYzine (ATARAX/VISTARIL) 25 MG tablet Take 1 tablet (25 mg total) by mouth 3 (three) times daily as needed for anxiety. 90 tablet 0  ? ibuprofen (ADVIL) 200 MG tablet Take 400 mg by mouth every 6 (six) hours as needed for mild pain or moderate pain. Motrin    ? Ibuprofen (MOTRIN PO) Take by mouth.    ? JANUMET 50-1000 MG tablet Take 1 tablet by mouth 2 (two) times daily.    ? LANTUS SOLOSTAR 100 UNIT/ML Solostar Pen Inject 55 Units into the skin 2 (two) times daily.    ? linagliptin (TRADJENTA) 5 MG TABS tablet Take 1 tablet (5 mg total) by mouth daily with breakfast. 30 tablet   ? liraglutide (VICTOZA) 18 MG/3ML SOPN Inject 1.8 mg into the skin daily.    ? lisinopril (PRINIVIL,ZESTRIL) 5 MG tablet Take 5 mg by mouth  daily.    ? metFORMIN (GLUCOPHAGE) 1000 MG tablet Take 1 tablet (1,000 mg total) by mouth 2 (two) times daily with a meal.    ? methocarbamol (ROBAXIN) 500 MG tablet Take 1 tablet (500 mg total) by mouth every 8

## 2021-09-09 ENCOUNTER — Other Ambulatory Visit: Payer: Self-pay

## 2021-09-09 ENCOUNTER — Inpatient Hospital Stay: Payer: Medicaid Other

## 2021-09-09 ENCOUNTER — Inpatient Hospital Stay: Payer: Medicaid Other | Attending: Hematology and Oncology | Admitting: Hematology and Oncology

## 2021-09-09 DIAGNOSIS — D696 Thrombocytopenia, unspecified: Secondary | ICD-10-CM

## 2021-09-09 DIAGNOSIS — K7460 Unspecified cirrhosis of liver: Secondary | ICD-10-CM

## 2021-09-09 LAB — CBC WITH DIFFERENTIAL (CANCER CENTER ONLY)
Abs Immature Granulocytes: 0.02 10*3/uL (ref 0.00–0.07)
Basophils Absolute: 0 10*3/uL (ref 0.0–0.1)
Basophils Relative: 1 %
Eosinophils Absolute: 0.1 10*3/uL (ref 0.0–0.5)
Eosinophils Relative: 1 %
HCT: 43 % (ref 36.0–46.0)
Hemoglobin: 14.8 g/dL (ref 12.0–15.0)
Immature Granulocytes: 1 %
Lymphocytes Relative: 30 %
Lymphs Abs: 1.3 10*3/uL (ref 0.7–4.0)
MCH: 29.5 pg (ref 26.0–34.0)
MCHC: 34.4 g/dL (ref 30.0–36.0)
MCV: 85.7 fL (ref 80.0–100.0)
Monocytes Absolute: 0.3 10*3/uL (ref 0.1–1.0)
Monocytes Relative: 7 %
Neutro Abs: 2.6 10*3/uL (ref 1.7–7.7)
Neutrophils Relative %: 60 %
Platelet Count: 82 10*3/uL — ABNORMAL LOW (ref 150–400)
RBC: 5.02 MIL/uL (ref 3.87–5.11)
RDW: 12.7 % (ref 11.5–15.5)
Smear Review: NORMAL
WBC Count: 4.3 10*3/uL (ref 4.0–10.5)
nRBC: 0 % (ref 0.0–0.2)

## 2021-09-09 LAB — VITAMIN B12: Vitamin B-12: 246 pg/mL (ref 180–914)

## 2021-09-09 LAB — IMMATURE PLATELET FRACTION: Immature Platelet Fraction: 2.9 % (ref 1.2–8.6)

## 2021-09-09 NOTE — Assessment & Plan Note (Signed)
Lab review: ?07/19/2014: Platelets 134 ?11/14/2017: Platelets 103 ?03/31/2020: Platelets 109 ?04/18/2021: Platelets 81 ?08/27/2021: Platelets 79 ? ?Mild thrombocytopenia: Most likely related to cirrhosis of the liver.  Other differentials include ? ?Differential diagnosis: ?1. Low-grade ITP ?2. medication induced: On further review patient is not taking any medications that are associated with thrombocytopenia ?3. Bone marrow factors ?4. Hepatitis B/C ?5.Ultrasound abdomen 04/19/2021: Cirrhosis of liver ? ?Given the fact that the other blood cell lines are intact there is no current indication for bone marrow biopsy. ? ?I recommended watchful monitoring. We can see the patient back in one year with labs and follow-up. ? ?

## 2021-09-12 NOTE — Progress Notes (Signed)
HEMATOLOGY-ONCOLOGY TELEPHONE VISIT PROGRESS NOTE ? ?I connected with Rhonda Higgins on 09/13/21 at 10:30 AM EDT by telephone and verified that I am speaking with the correct person using two identifiers.  ?I discussed the limitations, risks, security and privacy concerns of performing an evaluation and management service by telephone and the availability of in person appointments.  ?I also discussed with the patient that there may be a patient responsible charge related to this service. The patient expressed understanding and agreed to proceed.  ? ?CHIEF COMPLAINTS/PURPOSE OF CONSULTATION:  ?Thrombocytopenia ? ?History of Present Illness: 51 y.o. female is here because of recent diagnosis of Thrombocytopenia. She presents to the clinic today for via telephone follow-up.  She does not have any new complaints or concerns. ? ?REVIEW OF SYSTEMS:   ?Constitutional: Denies fevers, chills or abnormal weight loss ?All other systems were reviewed with the patient and are negative. ?Observations/Objective:  ? ?  ?Assessment Plan:  ?Thrombocytopenia ([place]) ?Lab review: ?07/19/2014: Platelets 134 ?11/14/2017: Platelets 103 ?03/31/2020: Platelets 109 ?04/18/2021: Platelets 81 ?08/27/2021: Platelets 79 ?09/09/2021: Platelets 82, B12 246, immature platelet fraction: 2.9% ?The low immature platelet fraction rules out ITP. ?Mild thrombocytopenia: Most likely related to cirrhosis of the liver.    ? ?Slightly low B12 levels: Encouraged her to take 1000 mcg of sublingual B12 ? ?Ultrasound abdomen 04/19/2021: Cirrhosis of liver ?  ?Given the fact that the other blood cell lines are intact there is no current indication for bone marrow biopsy. ?Return to clinic in 6 months with labs and follow-up ? ? ? ?I discussed the assessment and treatment plan with the patient. The patient was provided an opportunity to ask questions and all were answered. The patient agreed with the plan and demonstrated an understanding of the instructions. The patient was  advised to call back or seek an in-person evaluation if the symptoms worsen or if the condition fails to improve as anticipated.  ? ?I provided 15 minutes of non-face-to-face time during this encounter. Rhonda Ohara, MD   ? ?I Rhonda Higgins am scribing for Dr. Lindi Higgins ? ?I have reviewed the above documentation for accuracy and completeness, and I agree with the above. ?  ?

## 2021-09-13 ENCOUNTER — Inpatient Hospital Stay (HOSPITAL_BASED_OUTPATIENT_CLINIC_OR_DEPARTMENT_OTHER): Payer: Medicaid Other | Admitting: Hematology and Oncology

## 2021-09-13 DIAGNOSIS — D696 Thrombocytopenia, unspecified: Secondary | ICD-10-CM

## 2021-09-13 NOTE — Assessment & Plan Note (Signed)
Lab review: ?07/19/2014: Platelets 134 ?11/14/2017: Platelets 103 ?03/31/2020: Platelets 109 ?04/18/2021: Platelets 81 ?08/27/2021: Platelets 79 ?09/09/2021: Platelets 82, B12 246, immature platelet fraction: 2.9% ?The low immature platelet fraction rules out ITP. ?Mild thrombocytopenia: Most likely related to cirrhosis of the liver.    ? ?Ultrasound abdomen 04/19/2021: Cirrhosis of liver ?? ?Given the fact that the other blood cell lines are intact there is no current indication for bone marrow biopsy. ?Return to clinic in 6 months with labs and follow-up ?

## 2021-09-14 ENCOUNTER — Telehealth: Payer: Self-pay | Admitting: Hematology and Oncology

## 2021-09-14 NOTE — Telephone Encounter (Signed)
Scheduled appointment per 3/28 los. Left message. ?

## 2021-09-27 ENCOUNTER — Other Ambulatory Visit: Payer: Self-pay | Admitting: Orthopedic Surgery

## 2021-09-27 ENCOUNTER — Ambulatory Visit: Payer: Medicaid Other | Admitting: Podiatry

## 2021-10-07 ENCOUNTER — Other Ambulatory Visit: Payer: Self-pay | Admitting: Gastroenterology

## 2021-10-11 ENCOUNTER — Ambulatory Visit: Payer: Medicaid Other | Admitting: Podiatry

## 2021-10-13 DIAGNOSIS — E890 Postprocedural hypothyroidism: Secondary | ICD-10-CM | POA: Insufficient documentation

## 2021-10-17 ENCOUNTER — Ambulatory Visit (INDEPENDENT_AMBULATORY_CARE_PROVIDER_SITE_OTHER): Payer: Medicaid Other | Admitting: Podiatry

## 2021-10-17 ENCOUNTER — Encounter: Payer: Self-pay | Admitting: Podiatry

## 2021-10-17 DIAGNOSIS — M79674 Pain in right toe(s): Secondary | ICD-10-CM | POA: Diagnosis not present

## 2021-10-17 DIAGNOSIS — B351 Tinea unguium: Secondary | ICD-10-CM

## 2021-10-17 DIAGNOSIS — E1142 Type 2 diabetes mellitus with diabetic polyneuropathy: Secondary | ICD-10-CM

## 2021-10-17 DIAGNOSIS — M79675 Pain in left toe(s): Secondary | ICD-10-CM

## 2021-10-17 NOTE — Progress Notes (Signed)
?  Subjective:  ?Patient ID: Rhonda Higgins, female    DOB: 1970-06-25,  MRN: [id number] ? ?Rhonda Higgins presents to clinic today for at risk foot care with history of diabetic neuropathy and painful thick toenails that are difficult to trim. Pain interferes with ambulation. Aggravating factors include wearing enclosed shoe gear. Pain is relieved with periodic professional debridement. ? ?Patient states blood glucose was 133 mg/dl yesterday.  Patient did not check blood glucose today. ? ?Last known HgA1c was 9.9%. ? ?Patient did see Dr. Paulla Dolly for right ankle pain and received an injection which improved her symptoms. States she may have to schedule another injection in the future. ? ?New problem(s): None.  ? ?PCP is [name], MD , and last visit was February, 2023. ? ?Allergies  ?Allergen Reactions  ? Hydrocodone Shortness Of Breath and Itching  ? Tape Rash  ?  Paper tape is ok  ? Wound Dressing Adhesive Rash  ? Other Other (See Comments)  ?  Pholcodine ?Unknown  ? Pholcodine Itching  ? Codeine Itching  ? ? ?Review of Systems: Negative except as noted in the HPI. ? ?Objective: No changes noted in today's physical examination. ?There were no vitals filed for this visit. ? ?Rhonda Higgins is a pleasant 51 y.o. female in NAD. AAO X 3. ? ?Vascular Examination: ?CFT <3 seconds b/l LE. Palpable pedal pulses b/l LE. Pedal hair absent. No pain with calf compression b/l. Lower extremity skin temperature gradient within normal limits. No edema noted b/l LE. No cyanosis or clubbing noted b/l LE. ? ?Dermatological Examination: ?Pedal skin thin and atrophic b/l LE. No open wounds b/l LE. No interdigital macerations noted b/l LE. Toenails 1-5 b/l elongated, discolored, dystrophic, thickened, crumbly with subungual debris and tenderness to dorsal palpation. ? ?Musculoskeletal Examination: ?Normal muscle strength 5/5 to all lower extremity muscle groups bilaterally. No pain, crepitus or joint  limitation noted with ROM b/l LE. No gross bony pedal deformities b/l. Patient ambulates independently without assistive aids. ? ?Neurological Examination: ?Pt has subjective symptoms of neuropathy. Protective sensation intact 5/5 intact bilaterally with 10g monofilament b/l. Vibratory sensation intact b/l. ? ?Assessment/Plan: ?1. Pain due to onychomycosis of toenails of both feet   ?2. Diabetic peripheral neuropathy associated with type 2 diabetes mellitus ([place])   ?  ? ?-Patient was evaluated and treated. All patient's and/or POA's questions/concerns answered on today's visit. ?-Continue diabetic foot care principles: inspect feet daily, monitor glucose as recommended by PCP and/or Endocrinologist, and follow prescribed diet per PCP, Endocrinologist and/or dietician. ?-Patient to continue soft, supportive shoe gear daily. ?-Toenails 1-5 b/l were debrided in length and girth with sterile nail nippers and dremel without iatrogenic bleeding.  ?-Patient/POA to call should there be question/concern in the interim.  ? ?Return in about 3 months (around 01/17/2022). ? ?Marzetta Board, DPM  ?

## 2021-10-24 ENCOUNTER — Emergency Department (HOSPITAL_COMMUNITY)
Admission: EM | Admit: 2021-10-24 | Discharge: 2021-10-25 | Disposition: A | Discharge status: Home / Self Care | Payer: Medicaid Other | Attending: Emergency Medicine | Admitting: Emergency Medicine

## 2021-10-24 ENCOUNTER — Encounter (HOSPITAL_COMMUNITY): Payer: Self-pay

## 2021-10-24 ENCOUNTER — Other Ambulatory Visit: Payer: Self-pay

## 2021-10-24 DIAGNOSIS — M5432 Sciatica, left side: Secondary | ICD-10-CM | POA: Diagnosis not present

## 2021-10-24 DIAGNOSIS — R001 Bradycardia, unspecified: Secondary | ICD-10-CM | POA: Diagnosis not present

## 2021-10-24 DIAGNOSIS — M545 Low back pain, unspecified: Secondary | ICD-10-CM | POA: Diagnosis present

## 2021-10-24 DIAGNOSIS — M5431 Sciatica, right side: Secondary | ICD-10-CM | POA: Diagnosis not present

## 2021-10-24 NOTE — ED Triage Notes (Signed)
Patient reports bilateral lower back that radiates to both legs. Patient took methocarbamol and tylenol today with no relief. Patient denies falling or injury.  ?

## 2021-10-25 MED ORDER — LIDOCAINE 4 % EX PTCH: 1.0000 | MEDICATED_PATCH | CUTANEOUS | 0 refills | Status: DC

## 2021-10-25 MED ORDER — PREDNISONE 20 MG PO TABS: 40.0000 mg | ORAL_TABLET | Freq: Every day | ORAL | 0 refills | Status: DC

## 2021-10-25 MED ORDER — OXYCODONE HCL 5 MG PO TABS
5.0000 mg | ORAL_TABLET | Freq: Once | ORAL | Status: AC
  Administered 2021-10-25: 5 mg via ORAL
  Filled 2021-10-25: qty 1

## 2021-10-25 MED ORDER — LIDOCAINE 5 % EX PTCH
2.0000 | MEDICATED_PATCH | CUTANEOUS | Status: DC
  Administered 2021-10-25: 2 via TRANSDERMAL
  Filled 2021-10-25: qty 2

## 2021-10-25 NOTE — ED Notes (Signed)
I provided reinforced discharge education based off of discharge instructions. Pt acknowledged and understood my education. Pt had no further questions/concerns for provider/myself.  °

## 2021-10-25 NOTE — ED Provider Notes (Signed)
?[facility] DEPT ?Provider Note ? ? ?CSN: [id number] ?Arrival date & time: 10/24/21  2255 ? ?  ? ?History ? ?Chief Complaint  ?Patient presents with  ? Back Pain  ? ? ?Rhonda Higgins is a 51 y.o. female with h/o cirrhosis and DM presents to the ED for evaluation of bilateral low back pain.  For the past 2 days, the patient reports that it started on her left lower back radiating to her buttock and around to her anterior thigh.  Patient reports today it is now in her right lower back into her buttock and radiating around to her thigh.  She denies any urinary fecal incontinence, urinary retention, fever, numbness, tingling, weakness, dysuria, hematuria.  She denies any IV drug use ever.  She has tried Tylenol for her pain, although she reports she knows she is also to because of her cirrhosis.  She has not tried any Motrin.  Allergies to several narcotic pain medications.  She is currently on metformin, Ozempic, and glimepiride for diabetes.  She reports her sugars are well under control.  Her last blood sugar was after dinner today and it was around 112.  She denies any trauma, falls, or heavy lifting.  No known incident causing this pain. ? ?Back Pain ?Associated symptoms: no abdominal pain, no chest pain, no dysuria, no fever, no numbness and no weakness   ? ?  ? ?Home Medications ?Prior to Admission medications   ?Medication Sig Start Date End Date Taking? Authorizing Provider  ?lidocaine (HM LIDOCAINE PATCH) 4 % Place 1 patch onto the skin daily. 10/25/21  Yes Sherrell Puller, PA-C  ?predniSONE (DELTASONE) 20 MG tablet Take 2 tablets (40 mg total) by mouth daily. 10/25/21  Yes Sherrell Puller, PA-C  ?ACCU-CHEK GUIDE test strip  03/23/20   [provider]  ?Accu-Chek Softclix Lancets lancets daily. 07/21/20   [provider]  ?atorvastatin (LIPITOR) 20 MG tablet Take by mouth.    [provider]  ?atorvastatin (LIPITOR) 40 MG tablet Take 40 mg by mouth daily.  08/22/20   [provider]  ?buPROPion (WELLBUTRIN XL) 300 MG 24 hr tablet Take 300 mg by mouth every morning. 10/19/20   [provider]  ?buPROPion (WELLBUTRIN XL) 300 MG 24 hr tablet 1 tablet in the morning    [provider]  ?Cetirizine HCl (ZYRTEC ALLERGY) 10 MG CAPS Take 10 mg by mouth daily as needed (allergy).    [provider]  ?gabapentin (NEURONTIN) 400 MG capsule Take 400-1,200 mg by mouth See admin instructions. Takes 400 mg in the morning 400 mg in the afternoon and 1200 mg at night    [provider]  ?glimepiride (AMARYL) 4 MG tablet Take 4 mg by mouth 2 (two) times daily.     [provider]  ?hydrOXYzine (ATARAX/VISTARIL) 25 MG tablet Take 1 tablet (25 mg total) by mouth 3 (three) times daily as needed for anxiety. 04/04/20   Connye Burkitt, NP  ?Ibuprofen (MOTRIN PO) Take by mouth.    [provider]  ?JANUMET 50-1000 MG tablet Take 1 tablet by mouth 2 (two) times daily. 05/24/20   [provider]  ?linagliptin (TRADJENTA) 5 MG TABS tablet Take 1 tablet (5 mg total) by mouth daily with breakfast. 04/02/20   Money, Lowry Ram, FNP  ?lisinopril (PRINIVIL,ZESTRIL) 5 MG tablet Take 5 mg by mouth daily.    [provider]  ?metFORMIN (GLUCOPHAGE) 1000 MG tablet Take 1 tablet (1,000 mg total)  by mouth 2 (two) times daily with a meal. 04/01/20   Money, Lowry Ram, FNP  ?metFORMIN (GLUCOPHAGE-XR) 500 MG 24 hr tablet Take 500 mg by mouth 2 (two) times daily. 10/13/21   [provider]  ?methocarbamol (ROBAXIN) 500 MG tablet TAKE 1 TABLET BY MOUTH EVERY 8 HOURS AS NEEDED FOR MUSCLE SPASMS. 09/27/21   Magnant, Charles L, PA-C  ?ondansetron (ZOFRAN-ODT) 4 MG disintegrating tablet Take 4 mg by mouth 2 (two) times daily as needed for nausea/vomiting. 02/25/21   [provider]  ?OZEMPIC, 0.25 OR 0.5 MG/DOSE, 2 MG/3ML SOPN Inject into the skin. 10/14/21   [provider]  ?pantoprazole (PROTONIX) 40 MG tablet Take 40  mg by mouth 2 (two) times daily.     [provider]  ?Peppermint Oil (IBGARD) 90 MG CPCR 2 capsules 12/08/19   [provider]  ?polyethylene glycol-electrolytes (NULYTELY) 420 g solution Take 4,000 mLs by mouth once. 11/13/18   [provider]  ?risperiDONE (RISPERDAL) 2 MG tablet Take 2 mg by mouth at bedtime.  08/22/16   [provider]  ?sucralfate (CARAFATE) 1 g tablet Take 1 g by mouth 3 (three) times daily. 10/28/20   [provider]  ?SYNTHROID 175 MCG tablet Take 175 mcg by mouth daily. 07/27/20   [provider]  ?traZODone (DESYREL) 50 MG tablet Take 1 tablet (50 mg total) by mouth at bedtime. 04/18/21   Collene Gobble, MD  ?Vitamin D, Cholecalciferol, 25 MCG (1000 UT) TABS Take 1,000 Units by mouth daily.    [provider]  ?Vitamin D, Cholecalciferol, 25 MCG (1000 UT) TABS 1 tablet    [provider]  ?   ? ?Allergies    ?Hydrocodone, Tape, Wound dressing adhesive, Other, Pholcodine, and Codeine   ? ?Review of Systems   ?Review of Systems  ?Constitutional:  Negative for chills and fever.  ?Respiratory:  Negative for shortness of breath.   ?Cardiovascular:  Negative for chest pain.  ?Gastrointestinal:  Negative for abdominal pain, nausea and vomiting.  ?Genitourinary:  Negative for difficulty urinating, dysuria, enuresis, frequency, hematuria and urgency.  ?Musculoskeletal:  Positive for back pain. Negative for joint swelling.  ?Neurological:  Negative for weakness and numbness.  ? ?Physical Exam ?Updated Vital Signs ?BP 137/70   Pulse 71   Temp 98.2 ?F (36.8 ?C) (Oral)   Resp 18   Ht '5\' 3"'$  (1.6 m)   Wt 100.7 kg   SpO2 97%   BMI 39.33 kg/m?  ?Physical Exam ?Vitals and nursing note reviewed.  ?Constitutional:   ?   General: She is not in acute distress. ?   Appearance: Normal appearance. She is not ill-appearing or toxic-appearing.  ?HENT:  ?   Head: Normocephalic and atraumatic.  ?Eyes:  ?   General: No scleral  icterus. ?Cardiovascular:  ?   Rate and Rhythm: Regular rhythm. Bradycardia present.  ?Pulmonary:  ?   Effort: Pulmonary effort is normal. No respiratory distress.  ?   Breath sounds: Normal breath sounds.  ?Abdominal:  ?   General: Bowel sounds are normal.  ?   Palpations: Abdomen is soft.  ?   Tenderness: There is no abdominal tenderness. There is no guarding or rebound.  ?Musculoskeletal:     ?   General: Tenderness present. No deformity.  ?   Cervical back: Normal range of motion.  ?   Right lower leg: No edema.  ?   Left lower leg: No edema.  ?   Comments: No  midline cervical, thoracic, or lumbar tenderness.  No step-offs or deformities or crepitus.  Bilateral low paraspinal lumbar tenderness palpation.  No overlying skin changes noted.  Positive bilateral straight leg raise, left greater than right.  Strength is 5 out of 5 in lower bilateral extremities although she does have pain against resistance with both legs.  Flexion extension of ankle intact.  No drop foot noted.  Sensations equal in bilateral legs.  She has palpable DP PT pulses bilaterally.  Cap refill brisk.  Ambulatory without assistance.  ?Skin: ?   General: Skin is warm and dry.  ?Neurological:  ?   General: No focal deficit present.  ?   Mental Status: She is alert. Mental status is at baseline.  ?   Sensory: No sensory deficit.  ?   Motor: No weakness.  ?   Gait: Gait normal.  ? ? ?ED Results / Procedures / Treatments   ?Labs ?(all labs ordered are listed, but only abnormal results are displayed) ?Labs Reviewed - No data to display ? ?EKG ?None ? ?Radiology ?No results found. ? ?Procedures ?Procedures  ? ?Medications Ordered in ED ?Medications  ?oxyCODONE (Oxy IR/ROXICODONE) immediate release tablet 5 mg (has no administration in time range)  ?lidocaine (LIDODERM) 5 % 2 patch (has no administration in time range)  ? ? ?ED Course/ Medical Decision Making/ A&P ?  ?                        ?Medical Decision Making ?Risk ?OTC drugs. ?Prescription  drug management. ? ? ?51 year old female presents to the emergency department for evaluation of bilateral lower back pain for the past 2 days.  Differential diagnosis includes was not limited to lumbar sprain, lumbar strain, fra

## 2021-10-25 NOTE — Discharge Instructions (Addendum)
You were seen in the ER for evaluation of your lower back pain. Your exam and story is consistent with sciatic back pain. I have given you pain medication while in the ER. I am sending you home with a short course of steroids to help with pain in addition to some lidocaine patches.  The steroids may raise your blood sugar, please make sure you are checking this.  Please keep your appointment with your orthopedic tomorrow for follow up. If you have any fever, numbness, tingling, weakness, trouble walking, color change, temperature change, unable to control your bowel or bladder, or trouble urinating, please return to the nearest emergency department for evaluation. ? ?Contact a doctor if: ?You have pain that: ?Wakes you up when you are sleeping. ?Gets worse when you lie down. ?Is worse than the pain you have had in the past. ?Lasts longer than 4 weeks. ?You lose weight without trying. ?Get help right away if: ?You cannot control when you pee (urinate) or poop (have a bowel movement). ?You have weakness in any of these areas and it gets worse: ?Lower back. ?The area between your hip bones. ?Butt. ?Legs. ?You have redness or swelling of your back. ?You have a burning feeling when you pee. ?

## 2021-10-25 NOTE — ED Notes (Signed)
Pt ambulatory to bathroom without assistance.

## 2021-10-26 ENCOUNTER — Encounter: Payer: Self-pay | Admitting: Orthopedic Surgery

## 2021-10-26 ENCOUNTER — Ambulatory Visit: Payer: Medicaid Other

## 2021-10-26 ENCOUNTER — Ambulatory Visit (INDEPENDENT_AMBULATORY_CARE_PROVIDER_SITE_OTHER): Payer: Medicaid Other | Admitting: Surgical

## 2021-10-26 DIAGNOSIS — M5442 Lumbago with sciatica, left side: Secondary | ICD-10-CM

## 2021-10-26 DIAGNOSIS — M5441 Lumbago with sciatica, right side: Secondary | ICD-10-CM | POA: Diagnosis not present

## 2021-10-26 MED ORDER — IBUPROFEN 800 MG PO TABS
800.0000 mg | ORAL_TABLET | Freq: Three times a day (TID) | ORAL | 2 refills | Status: DC | PRN
Start: 2021-10-26 — End: 2022-03-03

## 2021-10-26 NOTE — Progress Notes (Signed)
? ?Office Visit Note ?  ?Patient: Rhonda Higgins           ?Date of Birth: [birth year]           ?MRN: [id number] ?Visit Date: 10/26/2021 ?Requested by: Rhonda Cowman, MD ?Rhonda Higgins,  [place] [address] ?PCP: Rhonda Cowman, MD ? ?Subjective: ?Chief Complaint  ?Patient presents with  ? Lower Back - Pain  ? ? ?HPI: Rhonda Higgins is a 51 y.o. female who presents to the office complaining of back pain and bilateral sciatica.  Patient states that she has had no recent injury but she began to notice sudden increase in her low back pain with associated left leg radicular pain.  Pain travels down the leg from the buttock down the posterior thigh into the anterior aspect of the knee and occasionally down into the calf as well.  This began on Friday and then on Monday she began having right-sided radicular pain as well.  Left side bothers her much more than the right.  Pain is intermittent and associated with numbness and tingling when it flares up.  Back pain bothers her about as much as the leg pain does.  No history of prior surgery or ESI's in the lumbar spine.  She has had prior MRI in 2020 of the lumbar spine that demonstrated no significant stenosis.  Pain wakes her up at night.  She went to the emergency department where she had prescription for high-dose prednisone course for 5 days.  She has not taken this yet.  She takes Motrin with some relief.  No groin pain, red flag symptoms, saddle anesthesia/incontinence.  She does have history of diabetes and her previous A1c was 9.9 but she is now on Ozempic and metformin and her blood glucose in the morning ranges from about 1 10-1 45 so she is achieving much better control since going to the endocrinologist..   ?             ?ROS: All systems reviewed are negative as they relate to the chief complaint within the history of present illness.  Patient denies fevers or chills. ? ?Assessment & Plan: ?Visit Diagnoses:  ?1. Acute bilateral low back  pain with bilateral sciatica   ? ? ?Plan: Patient is a 51 year old female who presents for evaluation of acute exacerbation of low back pain with bilateral radicular pain.  No lifting incident or any injury that she can recall.  She has had prior MRI about 3 years ago that demonstrated no stenosis.  She has radicular pain down both legs that radiates from the buttocks down to the knee/calf at times.  Has not started prednisone course yet.  Recommended she start this and monitor her blood glucose twice daily and consult her endocrinologist if the blood sugar gets too high.  Also prescribed 800 mg Motrin which has helped her partner in the past.  No red flag symptoms and not really much in the way of weakness on exam today.  She will follow-up in 4 weeks for clinical recheck with new x-rays.  If no improvement at that time, consider new MRI scan.  She will reach out sooner in the meantime if symptoms do not improve or if they worsen. ? ?Follow-Up Instructions: No follow-ups on file.  ? ?Orders:  ?Orders Placed This Encounter  ?Procedures  ? XR Lumbar Spine 2-3 Views  ? ?Meds ordered this encounter  ?Medications  ? ibuprofen (ADVIL) 800 MG tablet  ?  Sig:  Take 1 tablet (800 mg total) by mouth every 8 (eight) hours as needed.  ?  Dispense:  30 tablet  ?  Refill:  2  ? ? ? ? Procedures: ?No procedures performed ? ? ?Clinical Data: ?No additional findings. ? ?Objective: ?Vital Signs: There were no vitals taken for this visit. ? ?Physical Exam:  ?Constitutional: Patient appears well-developed ?HEENT:  ?Head: Normocephalic ?Eyes:EOM are normal ?Neck: Normal range of motion ?Cardiovascular: Normal rate ?Pulmonary/chest: Effort normal ?Neurologic: Patient is alert ?Skin: Skin is warm ?Psychiatric: Patient has normal mood and affect ? ?Ortho Exam: Ortho exam demonstrates positive straight leg raise on left and right, more severe on the left.  She has 5 -/5 hip flexion strength on the left.  5/5 hip flexion on the right,  bilateral quad, bilateral hamstring, bilateral dorsiflexion/plantarflexion strength.  Tenderness through the axial lumbar spine around the level of L4-L5.  No pain with hip range of motion bilaterally. ? ?Specialty Comments:  ?No specialty comments available. ? ?Imaging: ?No results found. ? ? ?PMFS History: ?Patient Active Problem List  ? Diagnosis Date Noted  ? 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  ? Thrombocytopenia ([place]) 09/09/2021  ? Iron deficiency anemia 06/24/2021  ? Status post bronchoscopy 04/20/2021  ? Pulmonary nodule 1 cm or greater in diameter 03/17/2021  ? Elevated levels of transaminase & lactic acid dehydrogenase 08/03/2020  ? Epigastric pain 08/03/2020  ? Liver nodule 08/03/2020  ? Obstruction of bile duct 08/03/2020  ? Cirrhosis of liver ([place]) 07/29/2020  ? Gastroparesis 07/29/2020  ? Irritable bowel syndrome 07/29/2020  ? Steatosis of liver 07/29/2020  ? MDD (major depressive disorder), recurrent episode, severe ([place]) 04/01/2020  ? Borderline personality disorder ([place]) 04/01/2020  ? Intermittent explosive disorder in adult 04/01/2020  ? Cholelithiasis with chronic cholecystitis 09/06/2017  ? Multinodular goiter (nontoxic) 11/17/2015  ? Diabetes mellitus without complication ([place]) 67/59/1638  ? GERD (gastroesophageal reflux disease) 07/19/2014  ? HLD (hyperlipidemia) 02/09/2010  ? Obstructive sleep apnea 02/09/2010  ? Allergic rhinitis 02/09/2010  ? History of uterine cancer 02/09/2010  ? ?Past Medical History:  ?Diagnosis Date  ? Adenocarcinoma ([place])   ? endometrial, FIGO GRADE 1  ? Allergic rhinitis   ? Atypical chest pain   ? History of  ? Depression   ? Elevated liver enzymes   ? GERD (gastroesophageal reflux disease)   ? Hematuria   ? History of endometrial cancer 08-02-2009   oncologist-  dr brewster/ [facility]man George and dr kinard/  no recurrence  ? endometrial adenocarinoma Stage 1B, Grade 1, FIGO--  s/p  TAH w/ BSO and pelvic lymph node dissection's and radiation therapy  ? History of kidney  stones   ? History of radiation therapy   ? 2011  pelvic intracavity brachytherapy treatment's for endometrial carcinoma  ? History of thyroid nodule   ? multinodular goiter s/p  total thyroidectomy 11-19-2015  per pathology -  adenomatoid nodules  ? Hyperlipidemia   ? Hypothyroidism, postsurgical   ? Insulin dependent diabetes mellitus   ? Type 2  ? Left ureteral stone   ? Obesity   ? OSA (obstructive sleep apnea)   ? severe OSA  per study 03-08-2010--  noncomplant cpap (no more sleep apnes since lost weight 2019)  ? Overactive bladder   ? Personality disorder ([place])   ? Polyphagia(783.6)   ? PONV (postoperative nausea and vomiting)   ? after ear surgery only one time  ? Right lower quadrant pain   ? Urgency of urination   ? UTI (urinary  tract infection)   ?  ?Family History  ?Problem Relation Age of Onset  ? Heart disease Sister   ? Heart attack Brother   ? Heart disease Brother   ? Heart disease Sister   ? Diabetes Sister   ? Breast cancer Sister   ? Asthma Mother   ? Heart disease Mother   ? Diabetes Mother   ? Emphysema Mother   ? Hypertension Mother   ? Stroke Mother   ? Prostate cancer Father   ?  ?Past Surgical History:  ?Procedure Laterality Date  ? BRONCHIAL BIOPSY  04/18/2021  ? Procedure: BRONCHIAL BIOPSIES;  Surgeon: Collene Gobble, MD;  Location: [facility] ENDOSCOPY;  Service: Pulmonary;;  ? BRONCHIAL BRUSHINGS  04/18/2021  ? Procedure: BRONCHIAL BRUSHINGS;  Surgeon: Collene Gobble, MD;  Location[facility] ENDOSCOPY;  Service: Pulmonary;;  ? BRONCHIAL NEEDLE ASPIRATION BIOPSY  04/18/2021  ? Procedure: BRONCHIAL NEEDLE ASPIRATION BIOPSIES;  Surgeon: Collene Gobble, MD;  Location: [facility] ENDOSCOPY;  Service: Pulmonary;;  ? CARDIOVASCULAR STRESS TEST  06/09/2008  ? normal nuclear study w/ no ischemia/  normal LV function and wall motion , ef 83%  ? CHOLECYSTECTOMY N/A 09/06/2017  ? Procedure: LAPAROSCOPIC CHOLECYSTECTOMY WITH INTRAOPERATIVE CHOLANGIOGRAM;  Surgeon: Armandina Gemma, MD;  Location: [facility] ORS;  Service: General;   Laterality: N/A;  ? CYSTOSCOPY/RETROGRADE/URETEROSCOPY/STONE EXTRACTION WITH BASKET Left 03/08/2016  ? Procedure: CYSTOSCOPY/RETROGRADE/URETEROSCOPY/STONE EXTRACTION WITH BASKET, STENT PLACEMENT;  Surgeon: Shanon Brow

## 2021-11-11 ENCOUNTER — Ambulatory Visit: Payer: Medicaid Other | Admitting: Orthopedic Surgery

## 2021-11-23 ENCOUNTER — Ambulatory Visit: Payer: Medicaid Other | Admitting: Orthopedic Surgery

## 2021-12-02 ENCOUNTER — Ambulatory Visit: Payer: Medicaid Other | Admitting: Surgical

## 2021-12-15 ENCOUNTER — Encounter (HOSPITAL_COMMUNITY): Payer: Self-pay | Admitting: Gastroenterology

## 2021-12-15 NOTE — Progress Notes (Signed)
Attempted to obtain medical history via telephone, unable to reach at this time. HIPAA compliant voicemail message left requesting return call to pre surgical testing department. 

## 2021-12-16 ENCOUNTER — Ambulatory Visit: Payer: Medicaid Other | Admitting: Surgical

## 2021-12-19 ENCOUNTER — Other Ambulatory Visit: Payer: Self-pay | Admitting: Physician Assistant

## 2021-12-19 DIAGNOSIS — K7689 Other specified diseases of liver: Secondary | ICD-10-CM

## 2021-12-19 DIAGNOSIS — K7460 Unspecified cirrhosis of liver: Secondary | ICD-10-CM

## 2021-12-27 ENCOUNTER — Encounter (HOSPITAL_COMMUNITY): Admission: RE | Payer: Self-pay | Source: Home / Self Care

## 2021-12-27 ENCOUNTER — Ambulatory Visit (HOSPITAL_COMMUNITY): Admission: RE | Admit: 2021-12-27 | Payer: Medicaid Other | Source: Home / Self Care | Admitting: Gastroenterology

## 2021-12-27 SURGERY — COLONOSCOPY WITH PROPOFOL
Anesthesia: Monitor Anesthesia Care

## 2021-12-31 ENCOUNTER — Ambulatory Visit
Admission: RE | Admit: 2021-12-31 | Discharge: 2021-12-31 | Disposition: A | Discharge status: Home / Self Care | Payer: Medicaid Other | Source: Ambulatory Visit | Attending: Physician Assistant | Admitting: Physician Assistant

## 2021-12-31 ENCOUNTER — Other Ambulatory Visit: Payer: Self-pay | Admitting: Physician Assistant

## 2021-12-31 DIAGNOSIS — K7460 Unspecified cirrhosis of liver: Secondary | ICD-10-CM

## 2021-12-31 DIAGNOSIS — K7689 Other specified diseases of liver: Secondary | ICD-10-CM

## 2021-12-31 MED ORDER — GADOBENATE DIMEGLUMINE 529 MG/ML IV SOLN: 20.0000 mL | Freq: Once | INTRAVENOUS | Status: DC | PRN

## 2022-01-17 ENCOUNTER — Ambulatory Visit (INDEPENDENT_AMBULATORY_CARE_PROVIDER_SITE_OTHER): Payer: Medicaid Other | Admitting: Podiatry

## 2022-01-17 ENCOUNTER — Encounter: Payer: Self-pay | Admitting: Podiatry

## 2022-01-17 DIAGNOSIS — Z86010 Personal history of colonic polyps: Secondary | ICD-10-CM | POA: Insufficient documentation

## 2022-01-17 DIAGNOSIS — Z860101 Personal history of adenomatous and serrated colon polyps: Secondary | ICD-10-CM | POA: Insufficient documentation

## 2022-01-17 DIAGNOSIS — M79674 Pain in right toe(s): Secondary | ICD-10-CM | POA: Diagnosis not present

## 2022-01-17 DIAGNOSIS — K5900 Constipation, unspecified: Secondary | ICD-10-CM | POA: Insufficient documentation

## 2022-01-17 DIAGNOSIS — M79675 Pain in left toe(s): Secondary | ICD-10-CM | POA: Diagnosis not present

## 2022-01-17 DIAGNOSIS — B351 Tinea unguium: Secondary | ICD-10-CM

## 2022-01-17 DIAGNOSIS — E1142 Type 2 diabetes mellitus with diabetic polyneuropathy: Secondary | ICD-10-CM | POA: Diagnosis not present

## 2022-01-21 NOTE — Progress Notes (Signed)
  Subjective:  Patient ID: Rhonda Higgins, female    DOB: 1970/10/16,  MRN: [id number]  Rhonda Higgins presents to clinic today for at risk foot care with history of diabetic neuropathy and painful elongated mycotic toenails 1-5 bilaterally which are tender when wearing enclosed shoe gear. Pain is relieved with periodic professional debridement.  Patient is proud to report she has gotten her A1c under control. Last A1c was 6.6%.  Patient did not check blood glucose today.  Her family came to visit her and her wife here in [place].  New problem(s): None.   PCP is [name], MD , and last visit was June, 2023.  Review of Systems: Negative except as noted in the HPI.  Objective: No changes noted in today's physical examination. Rhonda Higgins is a pleasant 51 y.o. female in NAD. AAO X 3.  Vascular Examination: CFT <3 seconds b/l LE. Palpable pedal pulses b/l LE. Pedal hair absent. No pain with calf compression b/l. Lower extremity skin temperature gradient within normal limits. No edema noted b/l LE. No cyanosis or clubbing noted b/l LE.  Dermatological Examination: Pedal skin thin and atrophic b/l LE. No open wounds b/l LE. No interdigital macerations noted b/l LE. Toenails 1-5 b/l elongated, discolored, dystrophic, thickened, crumbly with subungual debris and tenderness to dorsal palpation.  Musculoskeletal Examination: Normal muscle strength 5/5 to all lower extremity muscle groups bilaterally. No pain, crepitus or joint limitation noted with ROM b/l LE. No gross bony pedal deformities b/l. Patient ambulates independently without assistive aids.  Neurological Examination: Pt has subjective symptoms of neuropathy. Protective sensation intact 5/5 intact bilaterally with 10g monofilament b/l. Vibratory sensation intact b/l.  Assessment/Plan: 1. Pain due to onychomycosis of toenails of both feet   2. Diabetic peripheral neuropathy associated with  type 2 diabetes mellitus ([place])   -Examined patient. -No new findings. No new orders. -Patient to continue soft, supportive shoe gear daily. -Mycotic toenails 1-5 bilaterally were debrided in length and girth with sterile nail nippers and dremel without incident. -Patient/POA to call should there be question/concern in the interim.   Return in about 3 months (around 04/19/2022).  Marzetta Board, DPM

## 2022-02-08 ENCOUNTER — Emergency Department (HOSPITAL_COMMUNITY)
Admission: EM | Admit: 2022-02-08 | Discharge: 2022-02-08 | Discharge status: Against Medical Advice | Payer: Medicaid Other | Attending: Emergency Medicine | Admitting: Emergency Medicine

## 2022-02-08 ENCOUNTER — Other Ambulatory Visit: Payer: Self-pay

## 2022-02-08 ENCOUNTER — Encounter (HOSPITAL_COMMUNITY): Payer: Self-pay

## 2022-02-08 DIAGNOSIS — Z5321 Procedure and treatment not carried out due to patient leaving prior to being seen by health care provider: Secondary | ICD-10-CM | POA: Diagnosis not present

## 2022-02-08 DIAGNOSIS — M79621 Pain in right upper arm: Secondary | ICD-10-CM | POA: Diagnosis not present

## 2022-02-08 NOTE — ED Triage Notes (Signed)
Pt reports upper R arm pain that radiates to hand that began last night.

## 2022-02-17 ENCOUNTER — Ambulatory Visit (INDEPENDENT_AMBULATORY_CARE_PROVIDER_SITE_OTHER): Payer: Medicaid Other | Admitting: Surgical

## 2022-02-17 ENCOUNTER — Ambulatory Visit: Payer: Self-pay

## 2022-02-17 DIAGNOSIS — M7501 Adhesive capsulitis of right shoulder: Secondary | ICD-10-CM | POA: Diagnosis not present

## 2022-02-19 ENCOUNTER — Encounter: Payer: Self-pay | Admitting: Surgical

## 2022-02-19 MED ORDER — BUPIVACAINE HCL 0.5 % IJ SOLN
9.0000 mL | INTRAMUSCULAR | Status: AC | PRN
  Administered 2022-02-17: 9 mL via INTRA_ARTICULAR

## 2022-02-19 MED ORDER — LIDOCAINE HCL 1 % IJ SOLN
5.0000 mL | INTRAMUSCULAR | Status: AC | PRN
  Administered 2022-02-17: 5 mL

## 2022-02-19 MED ORDER — METHYLPREDNISOLONE ACETATE 40 MG/ML IJ SUSP
40.0000 mg | INTRAMUSCULAR | Status: AC | PRN
  Administered 2022-02-17: 40 mg via INTRA_ARTICULAR

## 2022-02-19 NOTE — Progress Notes (Signed)
Office Visit Note   Patient: Rhonda Higgins           Date of Birth: November 24, 1970           MRN: [id number] Visit Date: 02/17/2022 Requested by: [name], MD [address] [place],  [place] [address] PCP: [name], MD  Subjective: Chief Complaint  Patient presents with   Right Shoulder - Pain    HPI: Rhonda Higgins is a 51 y.o. female who presents to the office complaining of right shoulder pain.  Patient states that she has had increased pain in her right shoulder for about 2 weeks now.  Began several weeks ago when it woke her from sleep without any history of recent injury.  She describes the pain as severe pain that radiates down from the right side of her neck into the fingertips of the right arm.  She went to urgent care where she received 2 "shots" and a prescription for meloxicam and tramadol.  She feels a tearing sensation on the volar aspect of her forearm at times.  She also notes occasional scapular pain on the right-hand side.  No history of recent injury.  She does have history of right shoulder adhesive capsulitis and previous radicular pain from the pathology in her cervical spine.  She has a history of uncontrolled diabetes but currently her A1c has been between 6.6 and 7.0 which she has been working hard to maintain.  Symptoms have significantly died down in the last week or so but she still complains of focal shoulder pain with occasional radiation down her arm..                ROS: All systems reviewed are negative as they relate to the chief complaint within the history of present illness.  Patient denies fevers or chills.  Assessment & Plan: Visit Diagnoses:  1. Adhesive capsulitis of right shoulder     Plan: Patient is a 51 year old female who presents for evaluation of right shoulder pain.  Began as severe radicular pain about 2 weeks ago but has died down ever since urgent care visit.  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  She has had prior ESI's in the cervical spine but the last injection she had gave her severe panic attack and she would not like to repeat this at all in the future.  With most of her radicular pain improving over the last couple weeks, she seems to be complaining more of focal shoulder pain that goes along with her history of adhesive capsulitis.  Previously we have not been able to do any sort of cortisone injections into the glenohumeral joint due to her uncontrolled diabetes but it is now in an acceptable range and we can try cortisone injection and continue with home exercise program for her right shoulder and neck to see if this will improve her symptoms.  She agreed with this plan.  Under ultrasound guidance glenohumeral injection was successfully administered today in the clinic.  She tolerated the procedure well.  Follow-up with the office in 6 weeks for clinical recheck primarily on her shoulder pain and range of motion.  Follow-Up Instructions: No follow-ups on file.   Orders:  Orders Placed This Encounter  Procedures   US Guided Needle Placement - No Linked Charges   No orders of the defined types were placed in this encounter.  Procedures: Large Joint Inj: R glenohumeral on 02/17/2022 1:55 PM Indications: diagnostic evaluation and pain Details: 18 G 3.5 in needle, ultrasound-guided posterior approach  Arthrogram: No  Medications: 9 mL bupivacaine 0.5 %; 40 mg methylPREDNISolone acetate 40 MG/ML; 5 mL lidocaine 1 % Outcome: tolerated well, no immediate complications Procedure, treatment alternatives, risks and benefits explained, specific risks discussed. Consent was given by the patient. Immediately prior to procedure a time out was called to verify the correct patient, procedure, equipment, support staff and site/side marked as required. Patient was prepped and draped in the usual sterile  fashion.       Clinical Data: No additional findings.  Objective: Vital Signs: There were no vitals taken for this visit.  Physical Exam:  Constitutional: Patient appears well-developed HEENT:  Head: Normocephalic Eyes:EOM are normal Neck: Normal range of motion Cardiovascular: Normal rate Pulmonary/chest: Effort normal Neurologic: Patient is alert Skin: Skin is warm Psychiatric: Patient has normal mood and affect  Ortho Exam: Ortho exam demonstrates right shoulder with 45 degrees external rotation, 90 degrees abduction, 135 degrees forward flexion.  Stable over the left shoulder with size 75 degrees external rotation, 120 degrees abduction, 170 degrees forward flexion.  No significant crepitus noted with passive motion of the right shoulder.  No tenderness over the [facility] joint.  Mild tenderness over the bicipital groove.  Excellent rotator cuff strength of supra, infra, subscap rated 5/5.  5/5 motor strength of bilateral grip strength, EPL, FPL, finger abduction, finger adduction, wrist extension, pronation/supination, bicep, tricep, deltoid.  Axillary nerve is intact with deltoid firing.  She has negative Lhermitte sign.  Negative Spurling sign.  She has no tenderness over the axial cervical spine except for around the level of C5-C6.  Specialty Comments:  No specialty comments available.  Imaging: No results found.   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   Thrombocytopenia ([place]) 09/09/2021   Iron deficiency anemia 06/24/2021   Status post bronchoscopy 04/20/2021   Pulmonary nodule 1 cm or greater in diameter 03/17/2021   Elevated levels of transaminase & lactic acid dehydrogenase 08/03/2020   Epigastric pain 08/03/2020   Liver nodule 08/03/2020   Obstruction of bile duct 08/03/2020   Cirrhosis of liver ([place]) 07/29/2020    Gastroparesis 07/29/2020   Irritable bowel syndrome 07/29/2020   Steatosis of liver 07/29/2020   MDD (major depressive disorder), recurrent episode, severe ([place]) 04/01/2020   Borderline personality disorder ([place]) 04/01/2020   Intermittent explosive disorder in adult 04/01/2020   Cholelithiasis with chronic cholecystitis 09/06/2017   Multinodular goiter (nontoxic) 11/17/2015   Diabetes mellitus without complication ([place]) 56/38/7564   GERD (gastroesophageal reflux disease) 07/19/2014   HLD (hyperlipidemia) 02/09/2010   Obstructive sleep apnea 02/09/2010   Allergic rhinitis 02/09/2010   History of uterine cancer 02/09/2010   Past Medical History:  Diagnosis Date   Adenocarcinoma ([place])    endometrial, FIGO GRADE 1   Allergic rhinitis    Atypical chest pain    History of   Depression    Elevated liver enzymes    GERD (gastroesophageal reflux disease)    Hematuria    History of endometrial cancer 08-02-2009   oncologist-  dr brewster/ [facility] and dr kinard/  no recurrence   endometrial adenocarinoma Stage 1B, Grade 1, FIGO--  s/p  TAH w/ BSO and pelvic lymph node dissection's and radiation therapy   History of kidney  stones    History of radiation therapy    2011  pelvic intracavity brachytherapy treatment's for endometrial carcinoma   History of thyroid nodule    multinodular goiter s/p  total thyroidectomy 11-19-2015  per pathology -  adenomatoid nodules   Hyperlipidemia    Hypothyroidism, postsurgical    Insulin dependent diabetes mellitus    Type 2   Left ureteral stone    Obesity    OSA (obstructive sleep apnea)    severe OSA  per study 03-08-2010--  noncomplant cpap (no more sleep apnes since lost weight 2019)   Overactive bladder    Personality disorder ([facility])    Polyphagia(783.6)    PONV (postoperative nausea and vomiting)    after ear surgery only one time   Right lower quadrant pain    Urgency of urination    UTI (urinary tract infection)     Family History   Problem Relation Age of Onset   Heart disease Sister    Heart attack Brother    Heart disease Brother    Heart disease Sister    Diabetes Sister    Breast cancer Sister    Asthma Mother    Heart disease Mother    Diabetes Mother    Emphysema Mother    Hypertension Mother    Stroke Mother    Prostate cancer Father     Past Surgical History:  Procedure Laterality Date   BRONCHIAL BIOPSY  04/18/2021   Procedure: BRONCHIAL BIOPSIES;  Surgeon: Collene Gobble, MD;  Location: [facility] ENDOSCOPY;  Service: Pulmonary;;   BRONCHIAL BRUSHINGS  04/18/2021   Procedure: BRONCHIAL BRUSHINGS;  Surgeon: Collene Gobble, MD;  Location: [facility] ENDOSCOPY;  Service: Pulmonary;;   BRONCHIAL NEEDLE ASPIRATION BIOPSY  04/18/2021   Procedure: BRONCHIAL NEEDLE ASPIRATION BIOPSIES;  Surgeon: Collene Gobble, MD;  Location: [facility] ENDOSCOPY;  Service: Pulmonary;;   CARDIOVASCULAR STRESS TEST  06/09/2008   normal nuclear study w/ no ischemia/  normal LV function and wall motion , ef 83%   CHOLECYSTECTOMY N/A 09/06/2017   Procedure: LAPAROSCOPIC CHOLECYSTECTOMY WITH INTRAOPERATIVE CHOLANGIOGRAM;  Surgeon: Armandina Gemma, MD;  Location: [facility] ORS;  Service: General;  Laterality: N/A;   CYSTOSCOPY/RETROGRADE/URETEROSCOPY/STONE EXTRACTION WITH BASKET Left 03/08/2016   Procedure: CYSTOSCOPY/RETROGRADE/URETEROSCOPY/STONE EXTRACTION WITH BASKET, STENT PLACEMENT;  Surgeon: Rana Snare, MD;  Location: [facility];  Service: Urology;  Laterality: Left;   ENDOMETRIAL BIOPSY     ERCP N/A 09/07/2017   Procedure: ENDOSCOPIC RETROGRADE CHOLANGIOPANCREATOGRAPHY (ERCP);  Surgeon: [name], MD;  Location: [facility] ENDOSCOPY;  Service: Gastroenterology;  Laterality: N/A;   ESOPHAGOGASTRODUODENOSCOPY (EGD) WITH PROPOFOL N/A 03/16/2020   Procedure: ESOPHAGOGASTRODUODENOSCOPY (EGD) WITH PROPOFOL;  Surgeon: [name], MD;  Location: [facility] ENDOSCOPY;  Service: Gastroenterology;  Laterality: N/A;  unable to locate ampulla, aborted ERCP, changed  to EGD   HOLMIUM LASER APPLICATION Left 11/13/7822   Procedure: HOLMIUM LASER APPLICATION;  Surgeon: Rana Snare, MD;  Location: [facility];  Service: Urology;  Laterality: Left;   MOUTH SURGERY     MYRINGECOTMY W/ REMOVAL MIDDLE EAR CHOLESTEATOMA TYPE 1 FASICA TYMPANOPLASTY  09/13/2000   ROBOTIC ASSISTED TOTAL HYSTERECTOMY WITH BILATERAL SALPINGO OOPHERECTOMY  08-02-2009   at [facility]  dr Denman George   w/  Bilateral pelvic and para aortic lymph node dissection's   THYROIDECTOMY N/A 11/19/2015   Procedure: TOTAL THYROIDECTOMY;  Surgeon: Armandina Gemma, MD;  Location: [facility];  Service: General;  Laterality: N/A;   TONSILLECTOMY  age 67   TRANSTHORACIC ECHOCARDIOGRAM  07/19/2014  ef 55-60%/  trivial TR   TYMPANOPLASTY Right 1993   VIDEO BRONCHOSCOPY WITH ENDOBRONCHIAL NAVIGATION Left 04/18/2021   Procedure: ROBOTIC ASSISTED BRONCHOSCOPY WITH ENDOBRONCHIAL NAVIGATION;  Surgeon: Collene Gobble, MD;  Location: [facility] ENDOSCOPY;  Service: Pulmonary;  Laterality: Left;   VIDEO BRONCHOSCOPY WITH RADIAL ENDOBRONCHIAL ULTRASOUND  04/18/2021   Procedure: RADIAL ENDOBRONCHIAL ULTRASOUND;  Surgeon: Collene Gobble, MD;  Location: [facility] ENDOSCOPY;  Service: Pulmonary;;   Social History   Occupational History   Occupation: n/a  Tobacco Use   Smoking status: Never   Smokeless tobacco: Never  Vaping Use   Vaping Use: Never used  Substance and Sexual Activity   Alcohol use: No   Drug use: No   Sexual activity: Yes    Birth control/protection: None

## 2022-03-01 ENCOUNTER — Other Ambulatory Visit: Payer: Self-pay | Admitting: Family Medicine

## 2022-03-01 DIAGNOSIS — Z1231 Encounter for screening mammogram for malignant neoplasm of breast: Secondary | ICD-10-CM

## 2022-03-03 ENCOUNTER — Ambulatory Visit (INDEPENDENT_AMBULATORY_CARE_PROVIDER_SITE_OTHER): Payer: Medicaid Other | Admitting: Surgical

## 2022-03-03 DIAGNOSIS — M25521 Pain in right elbow: Secondary | ICD-10-CM | POA: Diagnosis not present

## 2022-03-03 DIAGNOSIS — M25421 Effusion, right elbow: Secondary | ICD-10-CM | POA: Diagnosis not present

## 2022-03-03 DIAGNOSIS — M7501 Adhesive capsulitis of right shoulder: Secondary | ICD-10-CM

## 2022-03-03 MED ORDER — TRAMADOL HCL 50 MG PO TABS: 50.0000 mg | ORAL_TABLET | Freq: Two times a day (BID) | ORAL | 0 refills | Status: DC | PRN

## 2022-03-03 MED ORDER — IBUPROFEN 800 MG PO TABS
800.0000 mg | ORAL_TABLET | Freq: Three times a day (TID) | ORAL | 2 refills | Status: DC | PRN
Start: 2022-03-03 — End: 2022-11-17

## 2022-03-04 ENCOUNTER — Encounter: Payer: Self-pay | Admitting: Surgical

## 2022-03-04 NOTE — Progress Notes (Signed)
Office Visit Note   Patient: Rhonda Higgins           Date of Birth: September 09, 1970           MRN: [id number] Visit Date: 03/03/2022 Requested by: [name], MD [address] [place],  [place] [address] PCP: [name], MD  Subjective: Chief Complaint  Patient presents with   Right Shoulder - Follow-up    HPI: Rhonda Higgins is a 51 y.o. female who presents to the office complaining of right elbow pain.  Patient states that she had great relief of her shoulder pain from glenohumeral injection that was performed about 2 weeks ago.  About 3 to 4 days ago she began to feel severe ripping sensation in the volar aspect of her proximal forearm.  This pain is severe and wakes her up at night at times.  It travels into the bicep muscle and about halfway down her forearm.  She does have some right-sided neck pain as well though this is a chronic issue for her.  Occasional scapular pain as well.  Shoulder pain itself is overall improved since the injection but she does still have some occasional shoulder pain.  Main concern is the elbow pain.  Taking Tylenol for pain that is not helping.  No history of prior issue with her elbow except for 1 episode of tennis elbow several years ago..                ROS: All systems reviewed are negative as they relate to the chief complaint within the history of present illness.  Patient denies fevers or chills.  Assessment & Plan: Visit Diagnoses:  1. Right elbow pain   2. Pain and swelling of right elbow     Plan: Patient is a 51 year old female who presents for evaluation of right elbow pain.  Most of her pain seems related to her bicep tendon with point tenderness over the structure on exam today.  She has a repeat sensation commonly which may be due to the tendon injury.  Weakness with supination and bicep flexion strength on exam today.  Also had ultrasound examination in the clinic today with the probe applied to the right bicep tendon  that showed there is some attachment to the radius intact but does appear to be some hypoechoic fluid just next to the tendon in the midportion which may reflect tendon tear.  And for MRI of the right elbow further evaluation distal biceps injury versus Endocell but she is also tender over the lateral mall.  Follow-up after MRI to review results.  If this MRI is negative for any acute elbow pathology, may need to repeat MRI cervical spine to evaluate for right-sided stenosis that is progressed since 2021 when her last MRI scan was.  She is having increasing right-sided arm pain that seems like there are multiple etiologies but may be cervical spine related.  Follow-Up Instructions: No follow-ups on file.   Orders:  Orders Placed This Encounter  Procedures   MR Elbow Right w/o contrast   Meds ordered this encounter  Medications   ibuprofen (ADVIL) 800 MG tablet    Sig: Take 1 tablet (800 mg total) by mouth every 8 (eight) hours as needed.    Dispense:  30 tablet    Refill:  2   traMADol (ULTRAM) 50 MG tablet    Sig: Take 1 tablet (50 mg total) by mouth every 12 (twelve) hours as needed.    Dispense:  30 tablet    Refill:  0      Procedures: No procedures performed   Clinical Data: No additional findings.  Objective: Vital Signs: There were no vitals taken for this visit.  Physical Exam:  Constitutional: Patient appears well-developed HEENT:  Head: Normocephalic Eyes:EOM are normal Neck: Normal range of motion Cardiovascular: Normal rate Pulmonary/chest: Effort normal Neurologic: Patient is alert Skin: Skin is warm Psychiatric: Patient has normal mood and affect  Ortho Exam: Ortho exam demonstrates right elbow with tenderness over the lateral epicondyle.  No tenderness over the medial epicondyle.  Severe tenderness over the bicep tendon distally.  She has what feels like decreased bulk of the bicep tendon on the right compared with her unaffected left bicep tendon.  She has  weakness of supination rated 4/5 compared with 5/5 supination strength on the left.  She has reproduction of her pain with testing her supination and bicep flexion strength.  Bicep flexion strength rated 4/5 as well.  No cellulitis or skin changes noted.  Specialty Comments:  No specialty comments available.  Imaging: No results found.   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   Thrombocytopenia ([place]) 09/09/2021   Iron deficiency anemia 06/24/2021   Status post bronchoscopy 04/20/2021   Pulmonary nodule 1 cm or greater in diameter 03/17/2021   Elevated levels of transaminase & lactic acid dehydrogenase 08/03/2020   Epigastric pain 08/03/2020   Liver nodule 08/03/2020   Obstruction of bile duct 08/03/2020   Cirrhosis of liver ([place]) 07/29/2020   Gastroparesis 07/29/2020   Irritable bowel syndrome 07/29/2020   Steatosis of liver 07/29/2020   MDD (major depressive disorder), recurrent episode, severe ([place]) 04/01/2020   Borderline personality disorder ([place]) 04/01/2020   Intermittent explosive disorder in adult 04/01/2020   Cholelithiasis with chronic cholecystitis 09/06/2017   Multinodular goiter (nontoxic) 11/17/2015   Diabetes mellitus without complication ([place]) 38/25/0539   GERD (gastroesophageal reflux disease) 07/19/2014   HLD (hyperlipidemia) 02/09/2010   Obstructive sleep apnea 02/09/2010   Allergic rhinitis 02/09/2010   History of uterine cancer 02/09/2010   Past Medical History:  Diagnosis Date   Adenocarcinoma ([place])    endometrial, FIGO GRADE 1   Allergic rhinitis    Atypical chest pain    History of   Depression    Elevated liver enzymes    GERD (gastroesophageal reflux disease)    Hematuria    History of endometrial cancer 08-02-2009   oncologist-  dr brewster/ [place] George and dr kinard/  no recurrence   endometrial  adenocarinoma Stage 1B, Grade 1, FIGO--  s/p  TAH w/ BSO and pelvic lymph node dissection's and radiation therapy   History of kidney stones    History of radiation therapy    2011  pelvic intracavity brachytherapy treatment's for endometrial carcinoma   History of thyroid nodule    multinodular goiter s/p  total thyroidectomy 11-19-2015  per pathology -  adenomatoid nodules   Hyperlipidemia    Hypothyroidism, postsurgical    Insulin dependent diabetes mellitus    Type 2   Left ureteral stone    Obesity    OSA (obstructive sleep apnea)    severe OSA  per study 03-08-2010--  noncomplant cpap (no more sleep apnes since lost weight 2019)   Overactive bladder    Personality disorder (HCC)    Polyphagia(783.6)    PONV (postoperative nausea and vomiting)  after ear surgery only one time   Right lower quadrant pain    Urgency of urination    UTI (urinary tract infection)     Family History  Problem Relation Age of Onset   Heart disease Sister    Heart attack Brother    Heart disease Brother    Heart disease Sister    Diabetes Sister    Breast cancer Sister    Asthma Mother    Heart disease Mother    Diabetes Mother    Emphysema Mother    Hypertension Mother    Stroke Mother    Prostate cancer Father     Past Surgical History:  Procedure Laterality Date   BRONCHIAL BIOPSY  04/18/2021   Procedure: BRONCHIAL BIOPSIES;  Surgeon: Collene Gobble, MD;  Location: [facility] ENDOSCOPY;  Service: Pulmonary;;   BRONCHIAL BRUSHINGS  04/18/2021   Procedure: BRONCHIAL BRUSHINGS;  Surgeon: Collene Gobble, MD;  Location: [facility];  Service: Pulmonary;;   BRONCHIAL NEEDLE ASPIRATION BIOPSY  04/18/2021   Procedure: BRONCHIAL NEEDLE ASPIRATION BIOPSIES;  Surgeon: Collene Gobble, MD;  Location: [facility] ENDOSCOPY;  Service: Pulmonary;;   CARDIOVASCULAR STRESS TEST  06/09/2008   normal nuclear study w/ no ischemia/  normal LV function and wall motion , ef 83%   CHOLECYSTECTOMY N/A 09/06/2017    Procedure: LAPAROSCOPIC CHOLECYSTECTOMY WITH INTRAOPERATIVE CHOLANGIOGRAM;  Surgeon: Armandina Gemma, MD;  Location: [facility] ORS;  Service: General;  Laterality: N/A;   CYSTOSCOPY/RETROGRADE/URETEROSCOPY/STONE EXTRACTION WITH BASKET Left 03/08/2016   Procedure: CYSTOSCOPY/RETROGRADE/URETEROSCOPY/STONE EXTRACTION WITH BASKET, STENT PLACEMENT;  Surgeon: Rana Snare, MD;  Location: [facility];  Service: Urology;  Laterality: Left;   ENDOMETRIAL BIOPSY     ERCP N/A 09/07/2017   Procedure: ENDOSCOPIC RETROGRADE CHOLANGIOPANCREATOGRAPHY (ERCP);  Surgeon: [name], MD;  Location: [facility];  Service: Gastroenterology;  Laterality: N/A;   ESOPHAGOGASTRODUODENOSCOPY (EGD) WITH PROPOFOL N/A 03/16/2020   Procedure: ESOPHAGOGASTRODUODENOSCOPY (EGD) WITH PROPOFOL;  Surgeon: [name], MD;  Location: [facility] ENDOSCOPY;  Service: Gastroenterology;  Laterality: N/A;  unable to locate ampulla, aborted ERCP, changed to EGD   HOLMIUM LASER APPLICATION Left 12/06/5091   Procedure: HOLMIUM LASER APPLICATION;  Surgeon: Rana Snare, MD;  Location: [facility];  Service: Urology;  Laterality: Left;   MOUTH SURGERY     MYRINGECOTMY W/ REMOVAL MIDDLE EAR CHOLESTEATOMA TYPE 1 FASICA TYMPANOPLASTY  09/13/2000   ROBOTIC ASSISTED TOTAL HYSTERECTOMY WITH BILATERAL SALPINGO OOPHERECTOMY  08-02-2009   at [facility]  dr Denman George   w/  Bilateral pelvic and para aortic lymph node dissection's   THYROIDECTOMY N/A 11/19/2015   Procedure: TOTAL THYROIDECTOMY;  Surgeon: Armandina Gemma, MD;  Location: [facility] ORS;  Service: General;  Laterality: N/A;   TONSILLECTOMY  age 37   TRANSTHORACIC ECHOCARDIOGRAM  07/19/2014   ef 55-60%/  trivial TR   TYMPANOPLASTY Right 1993   VIDEO BRONCHOSCOPY WITH ENDOBRONCHIAL NAVIGATION Left 04/18/2021   Procedure: ROBOTIC ASSISTED BRONCHOSCOPY WITH ENDOBRONCHIAL NAVIGATION;  Surgeon: Collene Gobble, MD;  Location: [facility] ENDOSCOPY;  Service: Pulmonary;  Laterality: Left;   VIDEO BRONCHOSCOPY WITH  RADIAL ENDOBRONCHIAL ULTRASOUND  04/18/2021   Procedure: RADIAL ENDOBRONCHIAL ULTRASOUND;  Surgeon: Collene Gobble, MD;  Location: [facility] ENDOSCOPY;  Service: Pulmonary;;   Social History   Occupational History   Occupation: n/a  Tobacco Use   Smoking status: Never   Smokeless tobacco: Never  Vaping Use   Vaping Use: Never used  Substance and Sexual Activity   Alcohol use: No   Drug use: No  Sexual activity: Yes    Birth control/protection: None

## 2022-03-13 NOTE — Progress Notes (Signed)
Patient Care Team: [name], MD as PCP - General (Family Medicine)  DIAGNOSIS:  Encounter Diagnosis  Name Primary?   Thrombocytopenia ([place])       CHIEF COMPLIANT: Follow-up Thrombocytopenia    INTERVAL HISTORY: Rhonda Higgins is a 51 y.o. female is here because of recent diagnosis of Thrombocytopenia. She presents to the clinic today for a follow-up. She states that her health has been fine. Denies bleeding but states that's he always bruise.   ALLERGIES:  is allergic to hydrocodone, tape, wound dressing adhesive, other, pholcodine, and codeine.  MEDICATIONS:  Current Outpatient Medications  Medication Sig Dispense Refill   ACCU-CHEK GUIDE test strip      Accu-Chek Softclix Lancets lancets daily.     ascorbic acid (VITAMIN C) 100 MG tablet Vitamin C     atorvastatin (LIPITOR) 40 MG tablet Take 40 mg by mouth daily.     buPROPion (WELLBUTRIN XL) 150 MG 24 hr tablet bupropion HCl XL 150 mg 24 hr tablet, extended release  TAKE 1 TABLET BY MOUTH EVERY DAY IN THE MORNING     Cetirizine HCl (ZYRTEC ALLERGY) 10 MG CAPS Take 10 mg by mouth daily as needed (allergy).     Cholecalciferol (VITAMIN D3) 250 MCG (10000 UT) capsule Take 10,000 Units by mouth daily.     Cyanocobalamin (VITAMIN B-12) 2500 MCG SUBL Place 2,500 mcg under the tongue daily.     Ferrous Sulfate (IRON PO) Take 45 mg by mouth daily.     gabapentin (NEURONTIN) 400 MG capsule Take 400-1,200 mg by mouth See admin instructions. Takes 400 mg in the morning 400 mg in the afternoon and 1200 mg at night     glimepiride (AMARYL) 4 MG tablet Take 4 mg by mouth 2 (two) times daily.   5   hydrOXYzine (ATARAX/VISTARIL) 25 MG tablet Take 1 tablet (25 mg total) by mouth 3 (three) times daily as needed for anxiety. 90 tablet 0   ibuprofen (ADVIL) 800 MG tablet Take 1 tablet (800 mg total) by mouth every 8 (eight) hours as needed. 30 tablet 2   lamoTRIgine (LAMICTAL) 200 MG tablet lamotrigine 200 mg tablet  TAKE 1  TABLET BY MOUTH EVERY DAY IN THE MORNING     lidocaine (HM LIDOCAINE PATCH) 4 % Place 1 patch onto the skin daily. (Patient not taking: Reported on 12/19/2021) 15 patch 0   lisinopril (PRINIVIL,ZESTRIL) 5 MG tablet Take 5 mg by mouth daily.     metFORMIN (GLUCOPHAGE-XR) 500 MG 24 hr tablet Take 500 mg by mouth 2 (two) times daily.     methocarbamol (ROBAXIN) 500 MG tablet TAKE 1 TABLET BY MOUTH EVERY 8 HOURS AS NEEDED FOR MUSCLE SPASMS. 30 tablet 0   ondansetron (ZOFRAN) 4 MG tablet Take 4 mg by mouth 3 (three) times daily as needed.     OZEMPIC, 0.25 OR 0.5 MG/DOSE, 2 MG/3ML SOPN Inject 0.5 mg into the skin once a week.     pantoprazole (PROTONIX) 40 MG tablet Take 40 mg by mouth 2 (two) times daily.   0   Peppermint Oil (IBGARD) 90 MG CPCR Take 2 tablets by mouth daily as needed (IBS).     polyethylene glycol-electrolytes (NULYTELY) 420 g solution Take 4,000 mLs by mouth once.     promethazine (PHENERGAN) 12.5 MG tablet Take 12.5 mg by mouth 3 (three) times daily as needed for nausea/vomiting.     risperiDONE (RISPERDAL) 2 MG tablet Take 2 mg by mouth at bedtime.   2  sucralfate (CARAFATE) 1 g tablet Take 1 g by mouth daily.     SYNTHROID 175 MCG tablet Take 175 mcg by mouth daily.     traMADol (ULTRAM) 50 MG tablet Take 1 tablet (50 mg total) by mouth every 12 (twelve) hours as needed. 30 tablet 0   traZODone (DESYREL) 50 MG tablet Take 1 tablet (50 mg total) by mouth at bedtime.     No current facility-administered medications for this visit.    PHYSICAL EXAMINATION: ECOG PERFORMANCE STATUS: 1 - Symptomatic but completely ambulatory  Vitals:   03/17/22 0949  BP: (!) 145/77  Pulse: (!) 58  Resp: 16  Temp: (!) 97.5 F (36.4 C)  SpO2: 98%   Filed Weights   03/17/22 0949  Weight: 214 lb 12.8 oz (97.4 kg)      LABORATORY DATA:  I have reviewed the data as listed    Latest Ref Rng & Units 08/27/2021   10:13 PM 04/30/2021    3:05 PM 04/18/2021    9:09 AM  CMP  Glucose 70 - 99  mg/dL 347  247  320   BUN 6 - 20 mg/dL _0 Creatinine 0.44 - 1.00 mg/dL 0.47  0.50  0.64   Sodium 135 - 145 mmol/L 135  137  137   Potassium 3.5 - 5.1 mmol/L 3.4  3.4  3.7   Chloride 98 - 111 mmol/L 104  106  104   CO2 22 - 32 mmol/L _1 Calcium 8.9 - 10.3 mg/dL 8.6  8.1  8.1     Lab Results  Component Value Date   WBC 3.6 (L) 03/17/2022   HGB 12.8 03/17/2022   HCT 38.6 03/17/2022   MCV 82.1 03/17/2022   PLT 85 (L) 03/17/2022   NEUTROABS 2.3 03/17/2022    ASSESSMENT & PLAN:  Thrombocytopenia ([place]) Lab review: 07/19/2014: Platelets 134 11/14/2017: Platelets 103 03/31/2020: Platelets 109 04/18/2021: Platelets 81 08/27/2021: Platelets 79 09/09/2021: Platelets 82, B12 246, immature platelet fraction: 2.9% The low immature platelet fraction rules out ITP. Mild thrombocytopenia: Most likely related to cirrhosis of the liver. 03/17/22: Platelet count 85      Slightly low B12 levels: Encouraged her to take 1000 mcg of sublingual B12   Ultrasound abdomen 04/19/2021: Cirrhosis of liver   Given the fact that the other blood cell lines are intact there is no current indication for bone marrow biopsy. Return to clinic in 1 with labs and follow-up    No orders of the defined types were placed in this encounter.  The patient has a good understanding of the overall plan. she agrees with it. she will call with any problems that may develop before the next visit here. Total time spent: 30 mins including face to face time and time spent for planning, charting and co-ordination of care   Harriette Ohara, MD 03/17/22    I Gardiner Coins am scribing for Dr. Lindi Adie  I have reviewed the above documentation for accuracy and completeness, and I agree with the above.

## 2022-03-14 ENCOUNTER — Other Ambulatory Visit: Payer: Self-pay | Admitting: Physician Assistant

## 2022-03-14 DIAGNOSIS — K769 Liver disease, unspecified: Secondary | ICD-10-CM

## 2022-03-14 DIAGNOSIS — K7460 Unspecified cirrhosis of liver: Secondary | ICD-10-CM

## 2022-03-17 ENCOUNTER — Inpatient Hospital Stay (HOSPITAL_BASED_OUTPATIENT_CLINIC_OR_DEPARTMENT_OTHER): Payer: Medicaid Other | Admitting: Hematology and Oncology

## 2022-03-17 ENCOUNTER — Inpatient Hospital Stay: Payer: Medicaid Other | Attending: Hematology and Oncology

## 2022-03-17 DIAGNOSIS — E538 Deficiency of other specified B group vitamins: Secondary | ICD-10-CM | POA: Diagnosis not present

## 2022-03-17 DIAGNOSIS — K7460 Unspecified cirrhosis of liver: Secondary | ICD-10-CM | POA: Diagnosis not present

## 2022-03-17 DIAGNOSIS — D696 Thrombocytopenia, unspecified: Secondary | ICD-10-CM

## 2022-03-17 LAB — CBC WITH DIFFERENTIAL (CANCER CENTER ONLY)
Abs Immature Granulocytes: 0.02 10*3/uL (ref 0.00–0.07)
Basophils Absolute: 0 10*3/uL (ref 0.0–0.1)
Basophils Relative: 0 %
Eosinophils Absolute: 0.1 10*3/uL (ref 0.0–0.5)
Eosinophils Relative: 2 %
HCT: 38.6 % (ref 36.0–46.0)
Hemoglobin: 12.8 g/dL (ref 12.0–15.0)
Immature Granulocytes: 1 %
Lymphocytes Relative: 27 %
Lymphs Abs: 1 10*3/uL (ref 0.7–4.0)
MCH: 27.2 pg (ref 26.0–34.0)
MCHC: 33.2 g/dL (ref 30.0–36.0)
MCV: 82.1 fL (ref 80.0–100.0)
Monocytes Absolute: 0.3 10*3/uL (ref 0.1–1.0)
Monocytes Relative: 7 %
Neutro Abs: 2.3 10*3/uL (ref 1.7–7.7)
Neutrophils Relative %: 63 %
Platelet Count: 85 10*3/uL — ABNORMAL LOW (ref 150–400)
RBC: 4.7 MIL/uL (ref 3.87–5.11)
RDW: 13.8 % (ref 11.5–15.5)
WBC Count: 3.6 10*3/uL — ABNORMAL LOW (ref 4.0–10.5)
nRBC: 0 % (ref 0.0–0.2)

## 2022-03-17 NOTE — Assessment & Plan Note (Signed)
Lab review: 07/19/2014: Platelets 134 11/14/2017: Platelets 103 03/31/2020: Platelets 109 04/18/2021: Platelets 81 08/27/2021: Platelets 79 09/09/2021: Platelets 82, B12 246, immature platelet fraction: 2.9% The low immature platelet fraction rules out ITP. Mild thrombocytopenia:Most likely related to cirrhosis of the liver. 03/17/22:     Slightly low B12 levels: Encouraged her to take 1000 mcg of sublingual B12  Ultrasound abdomen 04/19/2021: Cirrhosis of liver  Given the fact that the other blood cell lines are intact there is no current indication for bone marrow biopsy. Return to clinic in 6 months with labs and follow-up

## 2022-03-18 ENCOUNTER — Ambulatory Visit
Admission: RE | Admit: 2022-03-18 | Discharge: 2022-03-18 | Disposition: A | Discharge status: Home / Self Care | Payer: Medicaid Other | Source: Ambulatory Visit | Attending: Surgical | Admitting: Surgical

## 2022-03-18 DIAGNOSIS — M25521 Pain in right elbow: Secondary | ICD-10-CM

## 2022-03-24 ENCOUNTER — Other Ambulatory Visit: Payer: Self-pay | Admitting: Family Medicine

## 2022-03-24 DIAGNOSIS — N644 Mastodynia: Secondary | ICD-10-CM

## 2022-03-30 ENCOUNTER — Ambulatory Visit: Payer: Medicaid Other

## 2022-03-31 ENCOUNTER — Ambulatory Visit (INDEPENDENT_AMBULATORY_CARE_PROVIDER_SITE_OTHER): Payer: Medicaid Other | Admitting: Surgical

## 2022-03-31 ENCOUNTER — Encounter: Payer: Self-pay | Admitting: Surgical

## 2022-03-31 DIAGNOSIS — M898X1 Other specified disorders of bone, shoulder: Secondary | ICD-10-CM

## 2022-03-31 NOTE — Progress Notes (Signed)
Office Visit Note   Patient: Rhonda Higgins           Date of Birth: August 27, 1970           MRN: [id number] Visit Date: 03/31/2022 Requested by: [name], MD [address] [place],   [address] PCP: [name], MD  Subjective: Chief Complaint  Patient presents with   Right Elbow - Pain    HPI: Rhonda Higgins is a 51 y.o. female who presents to the office for MRI review.   Continues to complain mainly of her chronic right-sided neck pain as well as a little bit of increased pain over the medial clavicle without any history of injury.  She is only noticed this pain in the last 24 hours.  Her previous right arm pain that she was experiencing along with the ripping sensation has completely resolved and she is feeling very good in that respect.  MRI results revealed: MR Elbow Right w/o contrast  Result Date: 03/21/2022 CLINICAL DATA:  Right elbow pain for 1 month EXAM: MRI OF THE RIGHT ELBOW WITHOUT CONTRAST TECHNIQUE: Multiplanar, multisequence MR imaging of the elbow was performed. No intravenous contrast was administered. COMPARISON:  None Available. FINDINGS: TENDONS Common forearm flexor origin: Unremarkable Common forearm extensor origin: Mild tendinopathy of potential mild partial tearing particularly along the extensor carpi radialis brevis, images 12 through 13 of series 8. Biceps: Unremarkable Triceps: Unremarkable LIGAMENTS Medial stabilizers: Unremarkable Lateral stabilizers:  Unremarkable Cartilage: Unremarkable Joint: No effusion or free fragment. Cubital tunnel: Unremarkable Bones: Unremarkable Other: There is a suggestion of low-grade edema in the pronator teres and flexor carpi radialis muscles for example on images 7 through 9 of series 8, cannot exclude muscle strain. IMPRESSION: 1. Low-grade edema in the pronator teres and flexor carpi radialis muscles, suspicious for muscle strain. 2. Mild tendinopathy and potential mild partial tearing of the  common extensor tendon origin. 3. The distal biceps tendon appears intact. Electronically Signed   By: Van Clines M.D.   On: 03/21/2022 16:41                 ROS: All systems reviewed are negative as they relate to the chief complaint within the history of present illness.  Patient denies fevers or chills.  Assessment & Plan: Visit Diagnoses:  1. Clavicle pain     Plan: Rhonda Higgins is a 51 y.o. female who presents to the office for evaluation of right arm pain.  Right arm pain has resolved since last visit.  MRI demonstrated no tearing of the bicep tendon distally.  Her symptoms have pretty much resolved in the right arm and she is feeling very functional and pain-free.  She does note some increased pain in the region of the medial clavicle since this morning without any history of injury.  She has a little bit of increased swelling over this region compared with the left side on exam today with a palpable mobile mass that was evaluated with ultrasound.  Ultrasound demonstrated small isoechoic mass that was superficial to the Clavicular shaft.  There is no abnormal flow on color Doppler.  Plan is to have patient follow-up as needed and recommended that she reach out to [place] if she has continued pain/swelling in this location.  She will pay attention to this and let us know if anything changes or persists.  She does have history of uterine cancer that was treated with radiation as well as hysterectomy with bilateral salpingo-oophorectomy according  to her history..  This was back about 10 years ago.  Follow-Up Instructions: No follow-ups on file.   Orders:  No orders of the defined types were placed in this encounter.  No orders of the defined types were placed in this encounter.     Procedures: No procedures performed   Clinical Data: No additional findings.  Objective: Vital Signs: There were no vitals taken for this visit.  Physical Exam:  Constitutional: Patient  appears well-developed HEENT:  Head: Normocephalic Eyes:EOM are normal Neck: Normal range of motion Cardiovascular: Normal rate Pulmonary/chest: Effort normal Neurologic: Patient is alert Skin: Skin is warm Psychiatric: Patient has normal mood and affect  Ortho Exam: Ortho exam demonstrates right shoulder with 45 degrees external rotation, 95 degrees abduction, 140 degrees forward flexion.  Excellent rotator cuff strength of supra, infra, subscap.  Mild tenderness over the bicipital groove.  Mild tenderness over the [facility] joint of the right shoulder.  Mild tenderness over the axial cervical spine and increased pain with cervical spine range of motion.  She does have a little bit of focal swelling overlying the right collarbone with a palpable mobile mass.  This is not present on the left.  Specialty Comments:  No specialty comments available.  Imaging: No results found.   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   Thrombocytopenia ([place]) 09/09/2021   Iron deficiency anemia 06/24/2021   Status post bronchoscopy 04/20/2021   Pulmonary nodule 1 cm or greater in diameter 03/17/2021   Elevated levels of transaminase & lactic acid dehydrogenase 08/03/2020   Epigastric pain 08/03/2020   Liver nodule 08/03/2020   Obstruction of bile duct 08/03/2020   Cirrhosis of liver ([place]) 07/29/2020   Gastroparesis 07/29/2020   Irritable bowel syndrome 07/29/2020   Steatosis of liver 07/29/2020   MDD (major depressive disorder), recurrent episode, severe ([place]) 04/01/2020   Borderline personality disorder ([place]) 04/01/2020   Intermittent explosive disorder in adult 04/01/2020   Cholelithiasis with chronic cholecystitis 09/06/2017   Multinodular goiter (nontoxic) 11/17/2015   Diabetes mellitus without complication ([place]) 46/50/3546   GERD (gastroesophageal  reflux disease) 07/19/2014   HLD (hyperlipidemia) 02/09/2010   Obstructive sleep apnea 02/09/2010   Allergic rhinitis 02/09/2010   History of uterine cancer 02/09/2010   Past Medical History:  Diagnosis Date   Adenocarcinoma ([place])    endometrial, FIGO GRADE 1   Allergic rhinitis    Atypical chest pain    History of   Depression    Elevated liver enzymes    GERD (gastroesophageal reflux disease)    Hematuria    History of endometrial cancer 08-02-2009   oncologist-  dr brewster/ [place] George and dr kinard/  no recurrence   endometrial adenocarinoma Stage 1B, Grade 1, FIGO--  s/p  TAH w/ BSO and pelvic lymph node dissection's and radiation therapy   History of kidney stones    History of radiation therapy    2011  pelvic intracavity brachytherapy treatment's for endometrial carcinoma   History of thyroid nodule    multinodular goiter s/p  total thyroidectomy 11-19-2015  per pathology -  adenomatoid nodules   Hyperlipidemia    Hypothyroidism, postsurgical    Insulin dependent diabetes mellitus    Type 2   Left ureteral stone    Obesity    OSA (obstructive sleep apnea)    severe OSA  per study 03-08-2010--  noncomplant cpap (  no more sleep apnes since lost weight 2019)   Overactive bladder    Personality disorder (HCC)    Polyphagia(783.6)    PONV (postoperative nausea and vomiting)    after ear surgery only one time   Right lower quadrant pain    Urgency of urination    UTI (urinary tract infection)     Family History  Problem Relation Age of Onset   Heart disease Sister    Heart attack Brother    Heart disease Brother    Heart disease Sister    Diabetes Sister    Breast cancer Sister    Asthma Mother    Heart disease Mother    Diabetes Mother    Emphysema Mother    Hypertension Mother    Stroke Mother    Prostate cancer Father     Past Surgical History:  Procedure Laterality Date   BRONCHIAL BIOPSY  04/18/2021   Procedure: BRONCHIAL BIOPSIES;  Surgeon: Collene Gobble, MD;  Location: [facility] ENDOSCOPY;  Service: Pulmonary;;   BRONCHIAL BRUSHINGS  04/18/2021   Procedure: BRONCHIAL BRUSHINGS;  Surgeon: Collene Gobble, MD;  Location: [facility] ENDOSCOPY;  Service: Pulmonary;;   BRONCHIAL NEEDLE ASPIRATION BIOPSY  04/18/2021   Procedure: BRONCHIAL NEEDLE ASPIRATION BIOPSIES;  Surgeon: Collene Gobble, MD;  Location: [facility] ENDOSCOPY;  Service: Pulmonary;;   CARDIOVASCULAR STRESS TEST  06/09/2008   normal nuclear study w/ no ischemia/  normal LV function and wall motion , ef 83%   CHOLECYSTECTOMY N/A 09/06/2017   Procedure: LAPAROSCOPIC CHOLECYSTECTOMY WITH INTRAOPERATIVE CHOLANGIOGRAM;  Surgeon: Armandina Gemma, MD;  Location: [facility] ORS;  Service: General;  Laterality: N/A;   CYSTOSCOPY/RETROGRADE/URETEROSCOPY/STONE EXTRACTION WITH BASKET Left 03/08/2016   Procedure: CYSTOSCOPY/RETROGRADE/URETEROSCOPY/STONE EXTRACTION WITH BASKET, STENT PLACEMENT;  Surgeon: Rana Snare, MD;  Location: [facility] [place];  Service: Urology;  Laterality: Left;   ENDOMETRIAL BIOPSY     ERCP N/A 09/07/2017   Procedure: ENDOSCOPIC RETROGRADE CHOLANGIOPANCREATOGRAPHY (ERCP);  Surgeon: [name], MD;  Location: [facility] ENDOSCOPY;  Service: Gastroenterology;  Laterality: N/A;   ESOPHAGOGASTRODUODENOSCOPY (EGD) WITH PROPOFOL N/A 03/16/2020   Procedure: ESOPHAGOGASTRODUODENOSCOPY (EGD) WITH PROPOFOL;  Surgeon: [name], MD;  Location: [facility] ENDOSCOPY;  Service: Gastroenterology;  Laterality: N/A;  unable to locate ampulla, aborted ERCP, changed to EGD   HOLMIUM LASER APPLICATION Left 4/69/6295   Procedure: HOLMIUM LASER APPLICATION;  Surgeon: Rana Snare, MD;  Location: [facility];  Service: Urology;  Laterality: Left;   MOUTH SURGERY     MYRINGECOTMY W/ REMOVAL MIDDLE EAR CHOLESTEATOMA TYPE 1 FASICA TYMPANOPLASTY  09/13/2000   ROBOTIC ASSISTED TOTAL HYSTERECTOMY WITH BILATERAL SALPINGO OOPHERECTOMY  08-02-2009   at [facility]  dr Denman George   w/  Bilateral pelvic and para aortic lymph node  dissection's   THYROIDECTOMY N/A 11/19/2015   Procedure: TOTAL THYROIDECTOMY;  Surgeon: Armandina Gemma, MD;  Location: [facility] ORS;  Service: General;  Laterality: N/A;   TONSILLECTOMY  age 82   TRANSTHORACIC ECHOCARDIOGRAM  07/19/2014   ef 55-60%/  trivial TR   TYMPANOPLASTY Right 1993   VIDEO BRONCHOSCOPY WITH ENDOBRONCHIAL NAVIGATION Left 04/18/2021   Procedure: ROBOTIC ASSISTED BRONCHOSCOPY WITH ENDOBRONCHIAL NAVIGATION;  Surgeon: Collene Gobble, MD;  Location: [facility] ENDOSCOPY;  Service: Pulmonary;  Laterality: Left;   VIDEO BRONCHOSCOPY WITH RADIAL ENDOBRONCHIAL ULTRASOUND  04/18/2021   Procedure: RADIAL ENDOBRONCHIAL ULTRASOUND;  Surgeon: Collene Gobble, MD;  Location: [facility] ENDOSCOPY;  Service: Pulmonary;;   Social History   Occupational History   Occupation: n/a  Tobacco Use  Smoking status: Never   Smokeless tobacco: Never  Vaping Use   Vaping Use: Never used  Substance and Sexual Activity   Alcohol use: No   Drug use: No   Sexual activity: Yes    Birth control/protection: None

## 2022-04-01 ENCOUNTER — Ambulatory Visit
Admission: RE | Admit: 2022-04-01 | Discharge: 2022-04-01 | Disposition: A | Discharge status: Home / Self Care | Payer: Medicaid Other | Source: Ambulatory Visit | Attending: Physician Assistant | Admitting: Physician Assistant

## 2022-04-01 DIAGNOSIS — K7460 Unspecified cirrhosis of liver: Secondary | ICD-10-CM

## 2022-04-01 DIAGNOSIS — K769 Liver disease, unspecified: Secondary | ICD-10-CM

## 2022-04-01 MED ORDER — GADOPICLENOL 0.5 MMOL/ML IV SOLN
10.0000 mL | Freq: Once | INTRAVENOUS | Status: AC | PRN
  Administered 2022-04-01: 10 mL via INTRAVENOUS

## 2022-04-14 ENCOUNTER — Ambulatory Visit
Admission: RE | Admit: 2022-04-14 | Discharge: 2022-04-14 | Disposition: A | Discharge status: Home / Self Care | Payer: Medicaid Other | Source: Ambulatory Visit | Attending: Family Medicine | Admitting: Family Medicine

## 2022-04-14 ENCOUNTER — Ambulatory Visit: Payer: Medicaid Other

## 2022-04-14 DIAGNOSIS — N644 Mastodynia: Secondary | ICD-10-CM

## 2022-04-18 ENCOUNTER — Encounter (HOSPITAL_COMMUNITY): Payer: Self-pay | Admitting: Gastroenterology

## 2022-04-24 NOTE — H&P (Addendum)
History of Present Illness General:         51 year old female,presents for follow-up of epigastric pain and nausea. Patient states she discovered what was causing her upper GI symptoms. She attributes upper GI symptoms to taking Ozempic. Her PCP recently decreased her Ozempic dose and she is currently feeling a lot better. No more recent epigastric pain or nausea on low-dose Ozempic. She needs sublingual Zofran instead of tablet which is difficult to swallow.              She has history of cirrhosis, fatty liver disease, gastroparesis, liver lesion, history of adenomatous colon polyps, GERD, IBS, previous cholecystectomy in 2019, diabetes, borderline personality disorder.        Last colonoscopy 11/2018 showed 4 adenomatous polyps removed. 3 year repeat colonoscopy recommended.        EGD 11/2018 showed no evidence of esophageal varices. Biopsies negative for H. pylori and celiac.        Gastric emptying test 04/2019 was normal.        Last abdominal MRI with and without contrast 12/31/2021 showed 4 very small benign appearing liver nodules. Motion artifact. Repeat MRI was recommended in 3 months. Previous cholecystectomy. Stable liver cirrhosis.        Last lab work 12/19/2021 showed hemoglobin 14.4, platelets 79, normal CMP, PT/INR. Vital Signs Wt: 214.2, Wt change: 2.2 lbs, Ht: 63, BMI: 37.94, Temp: 97.9, Pulse sitting: 60, BP sitting: 137/91 155/93 first bp. Examination Gastroenterology Exam:        GENERAL APPEARANCE: Well developed, obese, well nourished, no active distress, pleasant, no acute distress.         SCLERA: anicteric.         RESPIRATORY Breath sounds clear to auscultation. No wheezes, rales or rhonchi. Respiration even and unlabored.         CARDIOVASCULAR Normal RRR w/o murmers or gallops. No peripheral edema.         ABDOMEN No masses palpated. Liver and spleen not palpated, normal. Bowel sounds normal, Abdomen morbidly obese; Unable to ascertain ascites?  Soft and obese.  Mild to  moderate Epigastric Tenderness; Rest of abdomen is not tender..  Assessments 1. Cirrhosis of liver without ascites, unspecified hepatic cirrhosis type - K74.60 (Primary) 2. Liver nodule - K76.89 3. Epigastric pain - R10.13, She is scheduled for EGD & Colonoscopy at Hospital with Dr. Therisa Doyne 04/25/22., Suspect dyspepsia. Has had similar pain in the past and EGD in 2017 showed mild antral gastritis. 4. Gastroparesis - K31.84 5. Nausea - R11.0 Treatment 1. Cirrhosis of liver without ascites, unspecified hepatic cirrhosis type           IMAGING: MR ABDOMEN WO/W CM             Dwana Curd 03/13/2022 09:55:51 AM > order faxed  2. Liver nodule       IMAGING: MR ABDOMEN WO/W CM             3. Epigastric pain       LAB: Lipase   4. Nausea  Stop Zofran Tablet, 4 MG, 1 tablet, Orally, three times a day as needed for nausea, Notes to Pharmacist: prn Start Ondansetron Tablet Disintegrating, 4 MG, 1 tablet on the tongue and allow to dissolve, Orally, every 6 hours as needed prn nausea, 30 days, 60, Refills 3        LIPASE 61  14-72 - U/L      Prothrombin Time 10.7  9.1-12.0 - sec  INR 1.0  0.9-1.2 -      GLUCOSE 175 H 70-99 - mg/dL      BUN 7  6-26 - mg/dL      CREATININE 0.55 L 0.60-1.30 - mg/dl      eGFR 110  >60 - calc      SODIUM 142  136-145 - mmol/L      POTASSIUM 3.8  3.5-5.5 - mmol/L      CHLORIDE 106  98-107 - mmol/L      C02 29  22-32 - mmol/L      ANION GAP 11.3  6.0-20.0 - mmol/L      CALCIUM 9.1  8.6-10.3 - mg/dL      CA-[place]b corrected 9.32  8.60-10.30 - mg/dL      T PROTEIN 6.0  6.0-8.3 - g/dL      ALBUMIN 3.8  3.4-4.8 - g/dL      T.BILI 0.5  0.3-1.0 - mg/dL      ALP 85  38-126 - U/L      AST 27  0-39 - U/L      ALT 29  0-52 - U/L       WBC 4.1  4.0-11.0 - K/ul      RBC 4.76  4.20-5.40 - M/uL      HGB 12.9  12.0-16.0 - g/dL      HCT 39.1  37.0-47.0 - %      MCV 82.0  81.0-99.0 - fL      MCH 27.0  27.0-33.0 - pg      MCHC 32.9  32.0-36.0 - g/dL       RDW 14.6  11.5-15.5 - %      PLT 91 L 150-400 - K/uL      MPV 7.9  7.5-10.7 - fL      NRBC# 0.00  -      NEUT % 67.6  43.3-71.9 - %      NRBC% 0.10  - %      LYMPH% 22.4  16.8-43.5 - %      MONO % 7.9  4.6-12.4 - %      EOS % 1.7  0.0-7.8 - %      BASO % 0.4  0.0-1.0 - %      NEUT # 2.7  1.9-7.2 - K/uL      LYMPH# 0.90 L 1.10-2.70 - K/uL      MONO # 0.3  0.3-0.8 - K/uL      EOS # 0.1  0.0-0.6 - K/uL      BASO # 0.0  0.0-0.1 - K/uL

## 2022-04-25 ENCOUNTER — Ambulatory Visit (HOSPITAL_COMMUNITY): Payer: Medicaid Other | Admitting: Anesthesiology

## 2022-04-25 ENCOUNTER — Other Ambulatory Visit: Payer: Self-pay

## 2022-04-25 ENCOUNTER — Ambulatory Visit (HOSPITAL_BASED_OUTPATIENT_CLINIC_OR_DEPARTMENT_OTHER): Payer: Medicaid Other | Admitting: Anesthesiology

## 2022-04-25 ENCOUNTER — Ambulatory Visit (HOSPITAL_COMMUNITY)
Admission: RE | Admit: 2022-04-25 | Discharge: 2022-04-25 | Disposition: A | Discharge status: Home / Self Care | Payer: Medicaid Other | Attending: Gastroenterology | Admitting: Gastroenterology

## 2022-04-25 ENCOUNTER — Encounter (HOSPITAL_COMMUNITY): Payer: Self-pay | Admitting: Gastroenterology

## 2022-04-25 ENCOUNTER — Encounter (HOSPITAL_COMMUNITY)
Admission: RE | Disposition: A | Discharge status: Home / Self Care | Payer: Self-pay | Source: Home / Self Care | Attending: Gastroenterology

## 2022-04-25 DIAGNOSIS — D123 Benign neoplasm of transverse colon: Secondary | ICD-10-CM | POA: Insufficient documentation

## 2022-04-25 DIAGNOSIS — Z86010 Personal history of colonic polyps: Secondary | ICD-10-CM

## 2022-04-25 DIAGNOSIS — Z7984 Long term (current) use of oral hypoglycemic drugs: Secondary | ICD-10-CM | POA: Insufficient documentation

## 2022-04-25 DIAGNOSIS — K219 Gastro-esophageal reflux disease without esophagitis: Secondary | ICD-10-CM | POA: Insufficient documentation

## 2022-04-25 DIAGNOSIS — R110 Nausea: Secondary | ICD-10-CM | POA: Insufficient documentation

## 2022-04-25 DIAGNOSIS — Z7985 Long-term (current) use of injectable non-insulin antidiabetic drugs: Secondary | ICD-10-CM | POA: Diagnosis not present

## 2022-04-25 DIAGNOSIS — K3189 Other diseases of stomach and duodenum: Secondary | ICD-10-CM | POA: Insufficient documentation

## 2022-04-25 DIAGNOSIS — K7460 Unspecified cirrhosis of liver: Secondary | ICD-10-CM

## 2022-04-25 DIAGNOSIS — K760 Fatty (change of) liver, not elsewhere classified: Secondary | ICD-10-CM | POA: Diagnosis not present

## 2022-04-25 DIAGNOSIS — K589 Irritable bowel syndrome without diarrhea: Secondary | ICD-10-CM | POA: Diagnosis not present

## 2022-04-25 DIAGNOSIS — K766 Portal hypertension: Secondary | ICD-10-CM | POA: Insufficient documentation

## 2022-04-25 DIAGNOSIS — E039 Hypothyroidism, unspecified: Secondary | ICD-10-CM

## 2022-04-25 DIAGNOSIS — Z9049 Acquired absence of other specified parts of digestive tract: Secondary | ICD-10-CM | POA: Insufficient documentation

## 2022-04-25 DIAGNOSIS — K2950 Unspecified chronic gastritis without bleeding: Secondary | ICD-10-CM | POA: Diagnosis not present

## 2022-04-25 DIAGNOSIS — F603 Borderline personality disorder: Secondary | ICD-10-CM | POA: Diagnosis not present

## 2022-04-25 DIAGNOSIS — K5730 Diverticulosis of large intestine without perforation or abscess without bleeding: Secondary | ICD-10-CM | POA: Insufficient documentation

## 2022-04-25 DIAGNOSIS — I8510 Secondary esophageal varices without bleeding: Secondary | ICD-10-CM | POA: Insufficient documentation

## 2022-04-25 DIAGNOSIS — E1143 Type 2 diabetes mellitus with diabetic autonomic (poly)neuropathy: Secondary | ICD-10-CM | POA: Diagnosis not present

## 2022-04-25 DIAGNOSIS — K3184 Gastroparesis: Secondary | ICD-10-CM | POA: Insufficient documentation

## 2022-04-25 DIAGNOSIS — Z09 Encounter for follow-up examination after completed treatment for conditions other than malignant neoplasm: Secondary | ICD-10-CM | POA: Insufficient documentation

## 2022-04-25 DIAGNOSIS — K648 Other hemorrhoids: Secondary | ICD-10-CM | POA: Diagnosis not present

## 2022-04-25 DIAGNOSIS — R1013 Epigastric pain: Secondary | ICD-10-CM | POA: Insufficient documentation

## 2022-04-25 DIAGNOSIS — K317 Polyp of stomach and duodenum: Secondary | ICD-10-CM

## 2022-04-25 HISTORY — PX: COLONOSCOPY WITH PROPOFOL: SHX5780

## 2022-04-25 HISTORY — PX: HEMOSTASIS CONTROL: SHX6838

## 2022-04-25 HISTORY — PX: BIOPSY: SHX5522

## 2022-04-25 HISTORY — PX: ESOPHAGOGASTRODUODENOSCOPY: SHX5428

## 2022-04-25 HISTORY — PX: POLYPECTOMY: SHX5525

## 2022-04-25 HISTORY — PX: HEMOSTASIS CLIP PLACEMENT: SHX6857

## 2022-04-25 LAB — GLUCOSE, CAPILLARY: Glucose-Capillary: 169 mg/dL — ABNORMAL HIGH (ref 70–99)

## 2022-04-25 SURGERY — COLONOSCOPY WITH PROPOFOL
Anesthesia: Monitor Anesthesia Care

## 2022-04-25 MED ORDER — EPINEPHRINE 1 MG/10ML IJ SOSY
PREFILLED_SYRINGE | INTRAMUSCULAR | Status: AC
  Filled 2022-04-25: qty 10

## 2022-04-25 MED ORDER — PROPOFOL 10 MG/ML IV BOLUS
INTRAVENOUS | Status: DC | PRN
  Administered 2022-04-25: 10 mg via INTRAVENOUS
  Administered 2022-04-25: 30 mg via INTRAVENOUS

## 2022-04-25 MED ORDER — PROPOFOL 500 MG/50ML IV EMUL
INTRAVENOUS | Status: DC | PRN
  Administered 2022-04-25: 125 ug/kg/min via INTRAVENOUS

## 2022-04-25 MED ORDER — SODIUM CHLORIDE (PF) 0.9 % IJ SOLN
PREFILLED_SYRINGE | INTRAMUSCULAR | Status: DC | PRN
  Administered 2022-04-25: 4 mL

## 2022-04-25 MED ORDER — ONDANSETRON HCL 4 MG/2ML IJ SOLN
INTRAMUSCULAR | Status: DC | PRN
  Administered 2022-04-25: 4 mg via INTRAVENOUS

## 2022-04-25 MED ORDER — LIDOCAINE 2% (20 MG/ML) 5 ML SYRINGE
INTRAMUSCULAR | Status: DC | PRN
  Administered 2022-04-25: 100 mg via INTRAVENOUS

## 2022-04-25 MED ORDER — PROPOFOL 500 MG/50ML IV EMUL
INTRAVENOUS | Status: AC
  Filled 2022-04-25: qty 50

## 2022-04-25 MED ORDER — PROPOFOL 1000 MG/100ML IV EMUL
INTRAVENOUS | Status: AC
  Filled 2022-04-25: qty 100

## 2022-04-25 MED ORDER — LACTATED RINGERS IV SOLN: INTRAVENOUS | Status: DC | PRN

## 2022-04-25 SURGICAL SUPPLY — 22 items

## 2022-04-25 NOTE — Anesthesia Preprocedure Evaluation (Addendum)
Anesthesia Evaluation  Patient identified by MRN, date of birth, ID band Patient awake    Reviewed: Allergy & Precautions, NPO status , Patient's Chart, lab work & pertinent test results  History of Anesthesia Complications (+) PONV and history of anesthetic complications  Airway Mallampati: II  TM Distance: >3 FB Neck ROM: Full    Dental  (+) Dental Advisory Given, Partial Upper   Pulmonary sleep apnea (noncompliant)    Pulmonary exam normal        Cardiovascular negative cardio ROS Normal cardiovascular exam     Neuro/Psych  PSYCHIATRIC DISORDERS  Depression     Personality d/o Intermittent explosive d/o negative neurological ROS     GI/Hepatic ,GERD  Medicated and Controlled,,(+) Cirrhosis         Endo/Other  diabetes, Type 2, Oral Hypoglycemic AgentsHypothyroidism    Renal/GU negative Renal ROS  Female GU complaint     Musculoskeletal negative musculoskeletal ROS (+)    Abdominal   Peds  Hematology negative hematology ROS (+)   Anesthesia Other Findings   Reproductive/Obstetrics  Endometrial cancer                              Anesthesia Physical Anesthesia Plan  ASA: 3  Anesthesia Plan: MAC   Post-op Pain Management: Minimal or no pain anticipated   Induction:   PONV Risk Score and Plan: 3 and Propofol infusion and Treatment may vary due to age or medical condition  Airway Management Planned: Nasal Cannula and Natural Airway  Additional Equipment: None  Intra-op Plan:   Post-operative Plan:   Informed Consent: I have reviewed the patients History and Physical, chart, labs and discussed the procedure including the risks, benefits and alternatives for the proposed anesthesia with the patient or authorized representative who has indicated his/her understanding and acceptance.       Plan Discussed with: CRNA and Anesthesiologist  Anesthesia Plan Comments:          Anesthesia Quick Evaluation

## 2022-04-25 NOTE — Anesthesia Postprocedure Evaluation (Signed)
Anesthesia Post Note  Patient: Ireland L Bynum Applegarth  Procedure(s) Performed: COLONOSCOPY WITH PROPOFOL ESOPHAGOGASTRODUODENOSCOPY (EGD) POLYPECTOMY BIOPSY HEMOSTASIS CONTROL HEMOSTASIS CLIP PLACEMENT     Patient location during evaluation: PACU Anesthesia Type: MAC Level of consciousness: awake and alert Pain management: pain level controlled Vital Signs Assessment: post-procedure vital signs reviewed and stable Respiratory status: spontaneous breathing, nonlabored ventilation and respiratory function stable Cardiovascular status: stable and blood pressure returned to baseline Anesthetic complications: no   No notable events documented.  Last Vitals:  Vitals:   04/25/22 1010 04/25/22 1012  BP: (!) 147/58 (!) 147/58  Pulse: 87 85  Resp: 20 (!) 25  Temp:    SpO2: 98% 98%    Last Pain:  Vitals:   04/25/22 1012  TempSrc:   PainSc: 0-No pain                 Audry Pili

## 2022-04-25 NOTE — Op Note (Signed)
[facility] Patient Name: Rhonda Higgins Procedure Date: 04/25/2022 MRN: [id number] Attending MD: [name] , MD, [id number] Date of Birth: 12/17/1970 CSN: [id number] Age: 51 Admit Type: Outpatient Procedure:                Colonoscopy Indications:              High risk colon cancer surveillance: Personal                            history of multiple (3 or more) adenomas Providers:                [name], MD, Dulcy Fanny, Gloris Ham, Technician Referring MD:             [name], MD Medicines:                Monitored Anesthesia Care Complications:            No immediate complications. Estimated blood loss:                            Minimal. Estimated Blood Loss:     Estimated blood loss was minimal. Procedure:                Pre-Anesthesia Assessment:                           - Prior to the procedure, a History and Physical                            was performed, and patient medications and                            allergies were reviewed. The patient's tolerance of                            previous anesthesia was also reviewed. The risks                            and benefits of the procedure and the sedation                            options and risks were discussed with the patient.                            All questions were answered, and informed consent                            was obtained. Prior Anticoagulants: The patient has                            taken no anticoagulant or antiplatelet agents. ASA  Grade Assessment: III - A patient with severe                            systemic disease. After reviewing the risks and                            benefits, the patient was deemed in satisfactory                            condition to undergo the procedure.                           After obtaining informed consent, the colonoscope                             was passed under direct vision. Throughout the                            procedure, the patient's blood pressure, pulse, and                            oxygen saturations were monitored continuously. The                            [id number]-HQ190L ([id number]) Olympus colonoscope was                            introduced through the anus and advanced to the the                            cecum, identified by appendiceal orifice and                            ileocecal valve. The colonoscopy was performed                            without difficulty. The patient tolerated the                            procedure well. The quality of the bowel                            preparation was adequate to identify polyps greater                            than 5 mm in size. The ileocecal valve, appendiceal                            orifice, and rectum were photographed. Scope In: 9:30:56 AM Scope Out: 0:08:67 AM Scope Withdrawal Time: 0 hours 12 minutes 39 seconds  Total Procedure Duration: 0 hours 15 minutes 23 seconds  Findings:      The perianal and digital rectal examinations were normal.      A 9 mm polyp was found in the  transverse colon. The polyp was sessile.       The polyp was removed with a hot snare. Resection and retrieval were       complete.      Scattered small-mouthed diverticula were found in the sigmoid colon and       transverse colon.      Non-bleeding internal hemorrhoids were found during retroflexion. Impression:               - One 9 mm polyp in the transverse colon, removed                            with a hot snare. Resected and retrieved.                           - Diverticulosis in the sigmoid colon and in the                            transverse colon.                           - Non-bleeding internal hemorrhoids. Moderate Sedation:      Patient did not receive moderate sedation for this procedure, but       instead received monitored anesthesia care. Recommendation:            - Patient has a contact number available for                            emergencies. The signs and symptoms of potential                            delayed complications were discussed with the                            patient. Return to normal activities tomorrow.                            Written discharge instructions were provided to the                            patient.                           - High fiber diet.                           - Continue present medications.                           - Await pathology results.                           - Repeat colonoscopy for surveillance based on                            pathology results. Procedure Code(s):        --- Professional ---  45385, Colonoscopy, flexible; with removal of                            tumor(s), polyp(s), or other lesion(s) by snare                            technique Diagnosis Code(s):        --- Professional ---                           Z86.010, Personal history of colonic polyps                           D12.3, Benign neoplasm of transverse colon (hepatic                            flexure or splenic flexure)                           K64.8, Other hemorrhoids                           K57.30, Diverticulosis of large intestine without                            perforation or abscess without bleeding CPT copyright 2022 American [employer]. All rights reserved. The codes documented in this report are preliminary and upon coder review may  be revised to meet current compliance requirements. [name], MD 04/25/2022 9:57:17 AM This report has been signed electronically. Number of Addenda: 0

## 2022-04-25 NOTE — Anesthesia Procedure Notes (Signed)
Procedure Name: MAC Date/Time: 04/25/2022 9:07 AM  Performed by: Maxwell Caul, CRNAPre-anesthesia Checklist: Patient identified, Emergency Drugs available, Suction available and Patient being monitored Oxygen Delivery Method: Simple face mask

## 2022-04-25 NOTE — Transfer of Care (Signed)
Immediate Anesthesia Transfer of Care Note  Patient: Rhonda Higgins  Procedure(s) Performed: COLONOSCOPY WITH PROPOFOL ESOPHAGOGASTRODUODENOSCOPY (EGD) POLYPECTOMY BIOPSY HEMOSTASIS CONTROL HEMOSTASIS CLIP PLACEMENT  Patient Location: Endoscopy Unit  Anesthesia Type:MAC  Level of Consciousness: awake, alert , and oriented  Airway & Oxygen Therapy: Patient Spontanous Breathing and Patient connected to face mask oxygen  Post-op Assessment: Report given to RN and Post -op Vital signs reviewed and stable  Post vital signs: Reviewed and stable  Last Vitals:  Vitals Value Taken Time  BP    Temp    Pulse    Resp    SpO2      Last Pain:  Vitals:   04/25/22 0755  TempSrc: Temporal  PainSc: 0-No pain         Complications: No notable events documented.

## 2022-04-25 NOTE — Op Note (Signed)
[facility] Patient Name: Rhonda Higgins Procedure Date: 04/25/2022 MRN: [id number] Attending MD: [name] , MD, [id number] Date of Birth: 1970-12-31 CSN: [id number] Age: 51 Admit Type: Outpatient Procedure:                Upper GI endoscopy Indications:              Epigastric abdominal pain, Cirrhosis rule out                            esophageal varices, Nausea Providers:                [name], MD, Dulcy Fanny, Gloris Ham, Technician Referring MD:             [name], MD Medicines:                Monitored Anesthesia Care Complications:            No immediate complications. Estimated blood loss:                            Minimal. Estimated Blood Loss:     Estimated blood loss was minimal. Procedure:                Pre-Anesthesia Assessment:                           - Prior to the procedure, a History and Physical                            was performed, and patient medications and                            allergies were reviewed. The patient's tolerance of                            previous anesthesia was also reviewed. The risks                            and benefits of the procedure and the sedation                            options and risks were discussed with the patient.                            All questions were answered, and informed consent                            was obtained. Prior Anticoagulants: The patient has                            taken no anticoagulant or antiplatelet agents. ASA                            Grade  Assessment: III - A patient with severe                            systemic disease. After reviewing the risks and                            benefits, the patient was deemed in satisfactory                            condition to undergo the procedure.                           After obtaining informed consent, the endoscope was                             passed under direct vision. Throughout the                            procedure, the patient's blood pressure, pulse, and                            oxygen saturations were monitored continuously. The                            [id number]-H190 ([id number]) Olympus endoscope was introduced                            through the mouth, and advanced to the second part                            of duodenum. The upper GI endoscopy was                            accomplished without difficulty. The patient                            tolerated the procedure well. Scope In: Scope Out: Findings:      Grade I varices were found in the lower third of the esophagus. They       were small in size.      The Z-line was regular and was found 35 cm from the incisors.      Red blood was found in the gastric body and on the greater curvature of       the stomach.      A single 10 mm semi-pedunculated polyp with bleeding and stigmata of       recent bleeding was found in the gastric body and on the greater       curvature of the stomach. The polyp was removed with a hot snare.       Resection and retrieval were complete. Area was successfully injected       with 4 mL of a 0.1 mg/mL solution of epinephrine for hemostasis. The       polyp was retrieved with a net. To stop active bleeding, two hemostatic       clips were successfully placed (MR conditional).  Clip manufacturer:       [employer]. There was no bleeding at the end of the procedure.      Moderate portal hypertensive gastropathy was found in the cardia, in the       gastric fundus, in the gastric body and in the gastric antrum.      The cardia and gastric fundus were normal on retroflexion.      Localized moderate inflammation characterized by congestion (edema),       erythema and nodularity was found in the gastric antrum. Biopsies were       taken with a cold forceps for Helicobacter pylori testing.      The examined duodenum was  normal. Impression:               - Grade I esophageal varices.                           - Z-line regular, 35 cm from the incisors.                           - Red blood in the gastric body and in the greater                            curvature of the stomach.                           - A single gastric polyp. Resected and retrieved.                            Injected. Clips (MR conditional) were placed. Clip                            manufacturer: [employer].                           - Portal hypertensive gastropathy.                           - Gastritis. Biopsied.                           - Normal examined duodenum. Moderate Sedation:      Patient did not receive moderate sedation for this procedure, but       instead received monitored anesthesia care. Recommendation:           - Patient has a contact number available for                            emergencies. The signs and symptoms of potential                            delayed complications were discussed with the                            patient. Return to normal activities tomorrow.  Written discharge instructions were provided to the                            patient.                           - Resume regular diet.                           - Continue present medications.                           - Await pathology results.                           - Repeat upper endoscopy in 1 year for surveillance. Procedure Code(s):        --- Professional ---                           [phone], 59, Esophagogastroduodenoscopy, flexible,                            transoral; with control of bleeding, any method                           43251, Esophagogastroduodenoscopy, flexible,                            transoral; with removal of tumor(s), polyp(s), or                            other lesion(s) by snare technique                           43239, 70, Esophagogastroduodenoscopy, flexible,                             transoral; with biopsy, single or multiple Diagnosis Code(s):        --- Professional ---                           K74.60, Unspecified cirrhosis of liver                           I85.10, Secondary esophageal varices without                            bleeding                           K92.2, Gastrointestinal hemorrhage, unspecified                           K31.7, Polyp of stomach and duodenum                           K76.6, Portal hypertension  K31.89, Other diseases of stomach and duodenum                           K29.70, Gastritis, unspecified, without bleeding                           R10.13, Epigastric pain                           R11.0, Nausea CPT copyright 2022 [employer]. All rights reserved. The codes documented in this report are preliminary and upon coder review may  be revised to meet current compliance requirements. [name], MD 04/25/2022 9:53:28 AM This report has been signed electronically. Number of Addenda: 0

## 2022-04-25 NOTE — Discharge Instructions (Signed)
YOU HAD AN ENDOSCOPIC PROCEDURE TODAY: Refer to the procedure report and other information in the discharge instructions given to you for any specific questions about what was found during the examination. If this information does not answer your questions, please call [facility] office at [phone] to clarify.   YOU SHOULD EXPECT: Some feelings of bloating in the abdomen. Passage of more gas than usual. Walking can help get rid of the air that was put into your GI tract during the procedure and reduce the bloating. If you had a lower endoscopy (such as a colonoscopy or flexible sigmoidoscopy) you may notice spotting of blood in your stool or on the toilet paper. Some abdominal soreness may be present for a day or two, also.  DIET: Your first meal following the procedure should be a light meal and then it is ok to progress to your normal diet. A half-sandwich or bowl of soup is an example of a good first meal. Heavy or fried foods are harder to digest and may make you feel nauseous or bloated. Drink plenty of fluids but you should avoid alcoholic beverages for 24 hours. If you had a esophageal dilation, please see attached instructions for diet.    ACTIVITY: Your care partner should take you home directly after the procedure. You should plan to take it easy, moving slowly for the rest of the day. You can resume normal activity the day after the procedure however YOU SHOULD NOT DRIVE, use power tools, machinery or perform tasks that involve climbing or major physical exertion for 24 hours (because of the sedation medicines used during the test).   SYMPTOMS TO REPORT IMMEDIATELY: A gastroenterologist can be reached at any hour. Please call [phone]  for any of the following symptoms:  Following lower endoscopy (colonoscopy, flexible sigmoidoscopy) Excessive amounts of blood in the stool  Significant tenderness, worsening of abdominal pains  Swelling of the abdomen that is new, acute  Fever of 100  or higher  Following upper endoscopy (EGD, EUS, ERCP, esophageal dilation) Vomiting of blood or coffee ground material  New, significant abdominal pain  New, significant chest pain or pain under the shoulder blades  Painful or persistently difficult swallowing  New shortness of breath  Black, tarry-looking or red, bloody stools  FOLLOW UP:  If any biopsies were taken you will be contacted by phone or by letter within the next 1-3 weeks. Call [phone]  if you have not heard about the biopsies in 3 weeks.  Please also call with any specific questions about appointments or follow up tests. YOU HAD AN ENDOSCOPIC PROCEDURE TODAY: Refer to the procedure report and other information in the discharge instructions given to you for any specific questions about what was found during the examination. If this information does not answer your questions, please call [facility] office at [phone] to clarify.  

## 2022-04-26 ENCOUNTER — Encounter (HOSPITAL_COMMUNITY): Payer: Self-pay | Admitting: Gastroenterology

## 2022-04-26 LAB — SURGICAL PATHOLOGY

## 2022-04-28 ENCOUNTER — Other Ambulatory Visit: Payer: Medicaid Other

## 2022-05-02 ENCOUNTER — Encounter: Payer: Self-pay | Admitting: Podiatry

## 2022-05-02 ENCOUNTER — Ambulatory Visit (INDEPENDENT_AMBULATORY_CARE_PROVIDER_SITE_OTHER): Payer: Medicaid Other | Admitting: Podiatry

## 2022-05-02 DIAGNOSIS — M79674 Pain in right toe(s): Secondary | ICD-10-CM | POA: Diagnosis not present

## 2022-05-02 DIAGNOSIS — M79675 Pain in left toe(s): Secondary | ICD-10-CM | POA: Diagnosis not present

## 2022-05-02 DIAGNOSIS — B351 Tinea unguium: Secondary | ICD-10-CM

## 2022-05-02 DIAGNOSIS — E1142 Type 2 diabetes mellitus with diabetic polyneuropathy: Secondary | ICD-10-CM

## 2022-05-02 NOTE — Progress Notes (Signed)
  Subjective:  Patient ID: Rhonda Higgins, female    DOB: 19-Aug-1970,  MRN: [id number]  Rhonda Higgins presents to clinic today for  Chief Complaint  Patient presents with   Nail Problem    DFC  BG -201 A1C - pt does not recall  PCP - Dr [name] last OV October 2023   New problem(s): None.   PCP is [name], MD.  Allergies  Allergen Reactions   Hydrocodone Shortness Of Breath and Itching   Tape Rash    Paper tape is ok   Wound Dressing Adhesive Rash   Other Other (See Comments)    Pholcodine Unknown   Pholcodine Itching   Codeine Itching    Review of Systems: Negative except as noted in the HPI.  Objective: No changes noted in today's physical examination.  Rhonda Higgins is a pleasant 51 y.o. female obese in NAD. AAO x 3.  Vascular Examination: CFT <3 seconds b/l LE. Palpable pedal pulses b/l LE. Pedal hair absent. No pain with calf compression b/l. Lower extremity skin temperature gradient within normal limits. No edema noted b/l LE. No cyanosis or clubbing noted b/l LE.  Dermatological Examination: Pedal skin thin and atrophic b/l LE. No open wounds b/l LE. No interdigital macerations noted b/l LE. Toenails 1-5 b/l elongated, discolored, dystrophic, thickened, crumbly with subungual debris and tenderness to dorsal palpation.  Musculoskeletal Examination: Normal muscle strength 5/5 to all lower extremity muscle groups bilaterally. No pain, crepitus or joint limitation noted with ROM b/l LE. No gross bony pedal deformities b/l. Patient ambulates independently without assistive aids.  Neurological Examination: Pt has subjective symptoms of neuropathy. Protective sensation intact 5/5 intact bilaterally with 10g monofilament b/l. Vibratory sensation intact b/l.  Assessment/Plan: 1. Pain due to onychomycosis of toenails of both feet   2. Diabetic peripheral neuropathy associated with type 2 diabetes mellitus (HCC)     No orders  of the defined types were placed in this encounter.   -Consent given for treatment as described below: -Continue supportive shoe gear daily. -Toenails 1-5 b/l were debrided in length and girth with sterile nail nippers and dremel without iatrogenic bleeding.  -Patient/POA to call should there be question/concern in the interim.   Return in about 3 months (around 08/02/2022).  Marzetta Board, DPM

## 2022-06-13 ENCOUNTER — Ambulatory Visit (INDEPENDENT_AMBULATORY_CARE_PROVIDER_SITE_OTHER): Payer: Medicaid Other | Admitting: Podiatry

## 2022-06-13 ENCOUNTER — Encounter: Payer: Self-pay | Admitting: Podiatry

## 2022-06-13 ENCOUNTER — Ambulatory Visit (INDEPENDENT_AMBULATORY_CARE_PROVIDER_SITE_OTHER): Payer: Medicaid Other

## 2022-06-13 DIAGNOSIS — G5761 Lesion of plantar nerve, right lower limb: Secondary | ICD-10-CM

## 2022-06-13 DIAGNOSIS — M79671 Pain in right foot: Secondary | ICD-10-CM | POA: Diagnosis not present

## 2022-06-13 DIAGNOSIS — M722 Plantar fascial fibromatosis: Secondary | ICD-10-CM

## 2022-06-13 DIAGNOSIS — R52 Pain, unspecified: Secondary | ICD-10-CM

## 2022-06-13 NOTE — Patient Instructions (Signed)

## 2022-06-13 NOTE — Progress Notes (Signed)
  Subjective:  Patient ID: Rhonda Higgins, female    DOB: 1971/04/16,  MRN: [id number]  Chief Complaint  Patient presents with   Foot Pain    Left foot pain    51 y.o. female presents with the above complaint. History confirmed with patient.  This is a new issue that is started couple days ago, denies any injury that she is aware of.  Has been painful with walking on the outside of the foot and heel feels like something is stabbing there.  Objective:  Physical Exam: warm, good capillary refill, no trophic changes or ulcerative lesions, normal DP and PT pulses, normal sensory exam, and sharp pain on palpation to plantar lateral band of plantar fascia, pain with compression under lateral heel.   Radiographs: Multiple views x-ray of the right foot: no fracture, dislocation, swelling or degenerative changes noted, plantar calcaneal spur, and posterior calcaneal spur Assessment:   1. Plantar fasciitis, right   2. New complaint of pain   3. Neuropathy of right lateral plantar nerve      Plan:  Patient was evaluated and treated and all questions answered.  Discussed the etiology and treatment options for plantar fasciitis including stretching, formal physical therapy, supportive shoegears such as a running shoe or sneaker, pre fabricated orthoses, injection therapy, and oral medications. We also discussed the role of surgical treatment of this for patients who do not improve after exhausting non-surgical treatment options.   -XR reviewed with patient -Educated patient on stretching and icing of the affected limb -Injection delivered to the plantar fascia of the right foot. -OTC Motrin as needed, she has Akin her milligram prescription, advised not to take this more than once or twice per day -Discussed with her some of this could be lateral plantar nerve entrapment as well injection should help with this here as well  After sterile prep with povidone-iodine solution and  alcohol, the right heel was injected with 0.5cc 2% xylocaine plain, 0.5cc 0.5% marcaine plain, '5mg'$  triamcinolone acetonide, and '2mg'$  dexamethasone was injected along the lateral plantar fascia at the insertion on the plantar calcaneus. The patient tolerated the procedure well without complication.   Return if symptoms worsen or fail to improve.

## 2022-06-19 DIAGNOSIS — D649 Anemia, unspecified: Secondary | ICD-10-CM

## 2022-06-19 HISTORY — DX: Anemia, unspecified: D64.9

## 2022-07-07 ENCOUNTER — Ambulatory Visit: Payer: Medicaid Other | Admitting: Surgical

## 2022-07-26 ENCOUNTER — Encounter: Payer: Medicaid Other | Attending: Family Medicine | Admitting: Dietician

## 2022-07-26 ENCOUNTER — Encounter: Payer: Self-pay | Admitting: Dietician

## 2022-07-26 VITALS — Ht 63.0 in | Wt 227.0 lb

## 2022-07-26 DIAGNOSIS — E119 Type 2 diabetes mellitus without complications: Secondary | ICD-10-CM | POA: Insufficient documentation

## 2022-07-26 NOTE — Progress Notes (Signed)
Diabetes Self-Management Education  Visit Type: First/Initial  Appt. Start Time: 1400 Appt. End Time: 1502  07/26/2022  Rhonda Higgins, identified by name and date of birth, is a 52 y.o. female with a diagnosis of Diabetes: Type 2.   ASSESSMENT  History includes: cancer, depression, type 2 diabetes, GERD, HLD, thyroid disease Labs noted: 05/18/22 A1c 8.5% Medications include: glimepiride, metformin, trulicity Supplements: vitamin D  Pt is present today with her wife.   Pt states she sleeps for 6 hours or less. Pt reports she used to have sleep apnea but she does not anymore.   Pt states she usually eats twice daily.   Pt and wife are both vegetarian. Pt eats dairy and sometimes eggs.   Pt states her blood sugar has been 200 in the morning and 300-400 in the evening.   Pt states she stays busy around the house.   Height '5\' 3"'$  (1.6 m), weight 227 lb (103 kg). Body mass index is 40.21 kg/m.   Diabetes Self-Management Education - 07/26/22 1401       Visit Information   Visit Type First/Initial      Initial Visit   Diabetes Type Type 2    Date Diagnosed 7 years ago    Are you currently following a meal plan? No    Are you taking your medications as prescribed? Yes      Health Coping   How would you rate your overall health? Fair      Psychosocial Assessment   Patient Belief/Attitude about Diabetes Motivated to manage diabetes    What is the hardest part about your diabetes right now, causing you the most concern, or is the most worrisome to you about your diabetes?   Making healty food and beverage choices    Self-care barriers None    Self-management support Doctor's office    Other persons present Patient    Patient Concerns Nutrition/Meal planning    Special Needs None    Preferred Learning Style No preference indicated    Learning Readiness Ready    How often do you need to have someone help you when you read instructions, pamphlets, or other  written materials from your doctor or pharmacy? 1 - Never    What is the last grade level you completed in school? 9th      Pre-Education Assessment   Patient understands the diabetes disease and treatment process. Needs Instruction    Patient understands incorporating nutritional management into lifestyle. Needs Instruction    Patient undertands incorporating physical activity into lifestyle. Needs Instruction    Patient understands using medications safely. Needs Instruction    Patient understands monitoring blood glucose, interpreting and using results Needs Instruction    Patient understands prevention, detection, and treatment of acute complications. Needs Instruction    Patient understands prevention, detection, and treatment of chronic complications. Needs Instruction    Patient understands how to develop strategies to address psychosocial issues. Needs Instruction    Patient understands how to develop strategies to promote health/change behavior. Needs Instruction      Complications   Last HgB A1C per patient/outside source 8.5 %    How often do you check your blood sugar? 1-2 times/day    Fasting Blood glucose range (mg/dL) 180-200    Postprandial Blood glucose range (mg/dL) >200    Have you had a dilated eye exam in the past 12 months? No    Have you had a dental exam in the past 12 months?  No    Are you checking your feet? Yes      Dietary Intake   Breakfast none    Snack (morning) none    Lunch 11am: [place] egg and cheese biscuit and home fries OR diner cheese omelet and hashbrown    Snack (afternoon) none    Dinner 4pm: vegetarian chili and fritos scoops OR spaghetti with veggies sauce OR lasagna    Snack (evening) 3 mint cookies    Beverage(s) coffee with half and half and equal, sparkling water 20 oz,      Activity / Exercise   Activity / Exercise Type ADL's    How many days per week do you exercise? 0    How many minutes per day do you exercise? 0    Total  minutes per week of exercise 0      Patient Education   Previous Diabetes Education Yes (please comment)    Disease Pathophysiology Definition of diabetes, type 1 and 2, and the diagnosis of diabetes;Factors that contribute to the development of diabetes;Explored patient's options for treatment of their diabetes    Healthy Eating Role of diet in the treatment of diabetes and the relationship between the three main macronutrients and blood glucose level;Plate Method;Carbohydrate counting;Reviewed blood glucose goals for pre and post meals and how to evaluate the patients' food intake on their blood glucose level.;Meal timing in regards to the patients' current diabetes medication.;Information on hints to eating out and maintain blood glucose control.;Meal options for control of blood glucose level and chronic complications.    Being Active Helped patient identify appropriate exercises in relation to his/her diabetes, diabetes complications and other health issue.;Role of exercise on diabetes management, blood pressure control and cardiac health.    Medications Reviewed patients medication for diabetes, action, purpose, timing of dose and side effects.;Reviewed medication adjustment guidelines for hyperglycemia and sick days.    Monitoring Identified appropriate SMBG and/or A1C goals.;Daily foot exams;Yearly dilated eye exam    Acute complications Discussed and identified patients' prevention, symptoms, and treatment of hyperglycemia.;Taught prevention, symptoms, and  treatment of hypoglycemia - the 15 rule.    Chronic complications Relationship between chronic complications and blood glucose control;Assessed and discussed foot care and prevention of foot problems;Identified and discussed with patient  current chronic complications;Lipid levels, blood glucose control and heart disease;Retinopathy and reason for yearly dilated eye exams;Nephropathy, what it is, prevention of, the use of ACE, ARB's and early  detection of through urine microalbumia.;Reviewed with patient heart disease, higher risk of, and prevention;Dental care    Diabetes Stress and Support Identified and addressed patients feelings and concerns about diabetes;Worked with patient to identify barriers to care and solutions;Role of stress on diabetes    Lifestyle and Health Coping Lifestyle issues that need to be addressed for better diabetes care      Individualized Goals (developed by patient)   Nutrition General guidelines for healthy choices and portions discussed    Physical Activity Exercise 3-5 times per week;15 minutes per day    Medications take my medication as prescribed    Monitoring  Test my blood glucose as discussed    Problem Solving Eating Pattern    Reducing Risk examine blood glucose patterns;do foot checks daily;treat hypoglycemia with 15 grams of carbs if blood glucose less than '70mg'$ /dL    Health Coping Ask for help with psychological, social, or emotional issues      Post-Education Assessment   Patient understands the diabetes disease and treatment process. Comprehends key  points    Patient understands incorporating nutritional management into lifestyle. Comprehends key points    Patient undertands incorporating physical activity into lifestyle. Comprehends key points    Patient understands using medications safely. Comphrehends key points    Patient understands monitoring blood glucose, interpreting and using results Comprehends key points    Patient understands prevention, detection, and treatment of acute complications. Comprehends key points    Patient understands prevention, detection, and treatment of chronic complications. Comprehends key points    Patient understands how to develop strategies to address psychosocial issues. Comprehends key points    Patient understands how to develop strategies to promote health/change behavior. Comprehends key points      Outcomes   Expected Outcomes Demonstrated  interest in learning. Expect positive outcomes    Future DMSE 3-4 months    Program Status Not Completed             Individualized Plan for Diabetes Self-Management Training:   Learning Objective:  Patient will have a greater understanding of diabetes self-management. Patient education plan is to attend individual and/or group sessions per assessed needs and concerns.   Plan:   Patient Instructions  Aim for 150 minutes of physical activity weekly. Goal: do a 15 minute chair workout 3x/wk.   Goal: drink 1 water bottle each day (or more!)  Try the unsweetened vanilla almond milk.   Aim to make 1/2 of your plate vegetables at least 2x/day  Aim to eat within 1-2 hours of waking up and every 3-5 hours following.   When snacking pair a carb and protein (see snack sheet).  Expected Outcomes:  Demonstrated interest in learning. Expect positive outcomes  Education material provided: ADA - How to Thrive: A Guide for Your Journey with Diabetes and Snack sheet, Meal Ideas  If problems or questions, patient to contact team via:  Phone  Future DSME appointment: 3-4 months

## 2022-07-26 NOTE — Patient Instructions (Addendum)
Aim for 150 minutes of physical activity weekly. Goal: do a 15 minute chair workout 3x/wk.   Goal: drink 1 water bottle each day (or more!)  Try the unsweetened vanilla almond milk.   Aim to make 1/2 of your plate vegetables at least 2x/day  Aim to eat within 1-2 hours of waking up and every 3-5 hours following.   When snacking pair a carb and protein (see snack sheet).

## 2022-07-31 ENCOUNTER — Other Ambulatory Visit (HOSPITAL_COMMUNITY): Payer: Self-pay | Admitting: Gastroenterology

## 2022-07-31 ENCOUNTER — Other Ambulatory Visit: Payer: Self-pay

## 2022-07-31 ENCOUNTER — Emergency Department (HOSPITAL_COMMUNITY): Payer: Medicaid Other

## 2022-07-31 ENCOUNTER — Emergency Department (HOSPITAL_COMMUNITY)
Admission: EM | Admit: 2022-07-31 | Discharge: 2022-07-31 | Disposition: A | Discharge status: Home / Self Care | Payer: Medicaid Other | Attending: Student | Admitting: Student

## 2022-07-31 DIAGNOSIS — Z5321 Procedure and treatment not carried out due to patient leaving prior to being seen by health care provider: Secondary | ICD-10-CM | POA: Diagnosis not present

## 2022-07-31 DIAGNOSIS — R109 Unspecified abdominal pain: Secondary | ICD-10-CM

## 2022-07-31 DIAGNOSIS — R110 Nausea: Secondary | ICD-10-CM

## 2022-07-31 DIAGNOSIS — K588 Other irritable bowel syndrome: Secondary | ICD-10-CM

## 2022-07-31 LAB — CBC WITH DIFFERENTIAL/PLATELET
Abs Immature Granulocytes: 0.01 10*3/uL (ref 0.00–0.07)
Basophils Absolute: 0 10*3/uL (ref 0.0–0.1)
Basophils Relative: 0 %
Eosinophils Absolute: 0.1 10*3/uL (ref 0.0–0.5)
Eosinophils Relative: 1 %
HCT: 36.8 % (ref 36.0–46.0)
Hemoglobin: 10.8 g/dL — ABNORMAL LOW (ref 12.0–15.0)
Immature Granulocytes: 0 %
Lymphocytes Relative: 22 %
Lymphs Abs: 0.8 10*3/uL (ref 0.7–4.0)
MCH: 22.3 pg — ABNORMAL LOW (ref 26.0–34.0)
MCHC: 29.3 g/dL — ABNORMAL LOW (ref 30.0–36.0)
MCV: 76 fL — ABNORMAL LOW (ref 80.0–100.0)
Monocytes Absolute: 0.3 10*3/uL (ref 0.1–1.0)
Monocytes Relative: 7 %
Neutro Abs: 2.6 10*3/uL (ref 1.7–7.7)
Neutrophils Relative %: 70 %
Platelets: 97 10*3/uL — ABNORMAL LOW (ref 150–400)
RBC: 4.84 MIL/uL (ref 3.87–5.11)
RDW: 17.1 % — ABNORMAL HIGH (ref 11.5–15.5)
WBC: 3.8 10*3/uL — ABNORMAL LOW (ref 4.0–10.5)
nRBC: 0 % (ref 0.0–0.2)

## 2022-07-31 LAB — URINALYSIS, ROUTINE W REFLEX MICROSCOPIC
Bilirubin Urine: NEGATIVE
Glucose, UA: >=500 mg/dL — AB
Hgb urine dipstick: NEGATIVE
Ketones, ur: NEGATIVE mg/dL
Leukocytes,Ua: NEGATIVE
Nitrite: NEGATIVE
Protein, ur: NEGATIVE mg/dL
Specific Gravity, Urine: 1.018 (ref 1.005–1.030)
pH: 6 (ref 5.0–8.0)

## 2022-07-31 LAB — COMPREHENSIVE METABOLIC PANEL
ALT: 41 U/L (ref 0–44)
AST: 53 U/L — ABNORMAL HIGH (ref 15–41)
Albumin: 3.4 g/dL — ABNORMAL LOW (ref 3.5–5.0)
Alkaline Phosphatase: 82 U/L (ref 38–126)
Anion gap: 9 (ref 5–15)
BUN: 11 mg/dL (ref 6–20)
CO2: 23 mmol/L (ref 22–32)
Calcium: 8.1 mg/dL — ABNORMAL LOW (ref 8.9–10.3)
Chloride: 106 mmol/L (ref 98–111)
Creatinine, Ser: 0.63 mg/dL (ref 0.44–1.00)
GFR, Estimated: >60 mL/min (ref >60)
Glucose, Bld: 281 mg/dL — ABNORMAL HIGH (ref 70–99)
Potassium: 3.6 mmol/L (ref 3.5–5.1)
Sodium: 138 mmol/L (ref 135–145)
Total Bilirubin: 0.6 mg/dL (ref 0.3–1.2)
Total Protein: 6.5 g/dL (ref 6.5–8.1)

## 2022-07-31 LAB — LIPASE, BLOOD: Lipase: 55 U/L — ABNORMAL HIGH (ref 11–51)

## 2022-07-31 NOTE — ED Provider Triage Note (Signed)
Emergency Medicine Provider Triage Evaluation Note  Caryn Section Applegarth , a 52 y.o. female  was evaluated in triage.  Pt complains of abdominal pain x 1 week.  Advised by GI doctor to have a pured diet for 3 days which she completed.  Upon starting regular food she developed significant pain.  Review of Systems  Positive: Abdominal pain Negative: fever  Physical Exam  BP 132/74 (BP Location: Left Arm)   Pulse 81   Temp 98.9 F (37.2 C) (Oral)   Resp 18   Wt 103 kg   SpO2 97%   BMI 40.22 kg/m  Gen:   Awake, no distress   Resp:  Normal effort  MSK:   Moves extremities without difficulty  Other:    Medical Decision Making  Medically screening exam initiated at 5:51 PM.  Appropriate orders placed.  Akirah L Bynum Applegarth was informed that the remainder of the evaluation will be completed by another provider, this initial triage assessment does not replace that evaluation, and the importance of remaining in the ED until their evaluation is complete.  Labs CT abd   Suzy Bouchard, [place] 07/31/22 1752

## 2022-07-31 NOTE — ED Triage Notes (Signed)
Pt complains of abd pain x 1 week went to GI doctor who put pt on a pureed diet for 3 days. Pt states did the pureed diet Wed-Friday last week. Saturday went to eat and immediately started to have abd pain. Pain starts in lower abd and radiates up. Pt takes dicyclomine for abd pain but it does not last. Stools are soft and denies any vomiting. CT scan was order but can't get done for 2 weeks and the pain today was excruciating.

## 2022-08-07 ENCOUNTER — Other Ambulatory Visit (HOSPITAL_COMMUNITY): Payer: Self-pay | Admitting: Gastroenterology

## 2022-08-07 DIAGNOSIS — R109 Unspecified abdominal pain: Secondary | ICD-10-CM

## 2022-08-07 DIAGNOSIS — R110 Nausea: Secondary | ICD-10-CM

## 2022-08-07 DIAGNOSIS — K588 Other irritable bowel syndrome: Secondary | ICD-10-CM

## 2022-08-08 ENCOUNTER — Ambulatory Visit (INDEPENDENT_AMBULATORY_CARE_PROVIDER_SITE_OTHER): Payer: Medicaid Other | Admitting: Podiatry

## 2022-08-08 DIAGNOSIS — M722 Plantar fascial fibromatosis: Secondary | ICD-10-CM

## 2022-08-11 ENCOUNTER — Telehealth: Payer: Self-pay | Admitting: *Deleted

## 2022-08-11 ENCOUNTER — Other Ambulatory Visit: Payer: Self-pay

## 2022-08-11 DIAGNOSIS — M722 Plantar fascial fibromatosis: Secondary | ICD-10-CM

## 2022-08-11 NOTE — Telephone Encounter (Signed)
Patient is calling because ([facility])were referral was sent does not accept her insurance,office notes not completed in epic.

## 2022-08-11 NOTE — Telephone Encounter (Signed)
I will send referral to [facility]

## 2022-08-12 NOTE — Progress Notes (Signed)
  Subjective:  Patient ID: Rhonda Higgins, female    DOB: 1970/08/19,  MRN: [id number]  Chief Complaint  Patient presents with   Plantar Fasciitis    2 month follow up right foot    52 y.o. female presents with the above complaint. History confirmed with patient.  She has had only slight improvement still has some pain on the outside edge  Objective:  Physical Exam: warm, good capillary refill, no trophic changes or ulcerative lesions, normal DP and PT pulses, normal sensory exam, and sharp pain on palpation to plantar lateral band of plantar fascia, pain with compression under lateral heel and to fifth metatarsal base   Radiographs: Multiple views x-ray of the right foot: no fracture, dislocation, swelling or degenerative changes noted, plantar calcaneal spur, and posterior calcaneal spur Assessment:   1. Plantar fasciitis, right      Plan:  Patient was evaluated and treated and all questions answered.  So far has not had much improvement.  I did recommend physical therapy and immobilization in a cam walker boot.  She has no physical restrictions for therapy.  I will see her back in about 1 month for follow-up.  If no improvement next visit would recommend an MRI  No follow-ups on file.

## 2022-08-14 ENCOUNTER — Ambulatory Visit (HOSPITAL_COMMUNITY)
Admission: RE | Admit: 2022-08-14 | Discharge: 2022-08-14 | Disposition: A | Discharge status: Home / Self Care | Payer: Medicaid Other | Source: Ambulatory Visit | Attending: Gastroenterology | Admitting: Gastroenterology

## 2022-08-14 ENCOUNTER — Encounter (HOSPITAL_COMMUNITY): Payer: Self-pay

## 2022-08-14 DIAGNOSIS — K588 Other irritable bowel syndrome: Secondary | ICD-10-CM

## 2022-08-14 DIAGNOSIS — R109 Unspecified abdominal pain: Secondary | ICD-10-CM

## 2022-08-14 DIAGNOSIS — R110 Nausea: Secondary | ICD-10-CM

## 2022-08-14 NOTE — Telephone Encounter (Signed)
Referral was sent over today.

## 2022-08-21 ENCOUNTER — Ambulatory Visit (INDEPENDENT_AMBULATORY_CARE_PROVIDER_SITE_OTHER): Payer: Medicaid Other | Admitting: Podiatry

## 2022-08-21 VITALS — BP 133/65

## 2022-08-21 DIAGNOSIS — M79674 Pain in right toe(s): Secondary | ICD-10-CM | POA: Diagnosis not present

## 2022-08-21 DIAGNOSIS — B351 Tinea unguium: Secondary | ICD-10-CM

## 2022-08-21 DIAGNOSIS — E1142 Type 2 diabetes mellitus with diabetic polyneuropathy: Secondary | ICD-10-CM

## 2022-08-21 DIAGNOSIS — M79675 Pain in left toe(s): Secondary | ICD-10-CM

## 2022-08-21 DIAGNOSIS — E119 Type 2 diabetes mellitus without complications: Secondary | ICD-10-CM

## 2022-08-21 NOTE — Progress Notes (Unsigned)
ANNUAL DIABETIC FOOT EXAM  Subjective: Rhonda Higgins presents today for annual diabetic foot examination.  Chief Complaint  Patient presents with   Nail Problem    DFC [id number] last night A1C-8.? PCP-Rhonda Higgins PCP VST-08/15/2022     Patient confirms h/o diabetes.  Patient relates 11 year h/o diabetes.  Patient denies any h/o foot wounds.  Patient has been diagnosed with neuropathy.  Risk factors: diabetes, diabetic neuropathy, hyperlipidemia.  Rhonda Cowman, MD is patient's PCP.  Past Medical History:  Diagnosis Date   Adenocarcinoma ([place])    endometrial, FIGO GRADE 1   Allergic rhinitis    Atypical chest pain    History of   Depression    Elevated liver enzymes    GERD (gastroesophageal reflux disease)    Hematuria    History of endometrial cancer 08-02-2009   oncologist-  dr Rhonda Higgins/ Rhonda Higgins and dr Rhonda Higgins/  no recurrence   endometrial adenocarinoma Stage 1B, Grade 1, FIGO--  s/p  TAH w/ BSO and pelvic lymph node dissection's and radiation therapy   History of kidney stones    History of radiation therapy    2011  pelvic intracavity brachytherapy treatment's for endometrial carcinoma   History of thyroid nodule    multinodular goiter s/p  total thyroidectomy 11-19-2015  per pathology -  adenomatoid nodules   Hyperlipidemia    Hypothyroidism, postsurgical    Insulin dependent diabetes mellitus    Type 2   Left ureteral stone    Obesity    OSA (obstructive sleep apnea)    severe OSA  per study 03-08-2010--  noncomplant cpap (no more sleep apnes since lost weight 2019)   Overactive bladder    Personality disorder (HCC)    Polyphagia(783.6)    PONV (postoperative nausea and vomiting)    after ear surgery only one time   Right lower quadrant pain    Urgency of urination    UTI (urinary tract infection)    Patient Active Problem List   Diagnosis Date Noted   History of adenomatous polyp of colon 01/17/2022   Constipation 01/17/2022    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   Thrombocytopenia ([place]) 09/09/2021   Iron deficiency anemia 06/24/2021   Status post bronchoscopy 04/20/2021   Pulmonary nodule 1 cm or greater in diameter 03/17/2021   Elevated levels of transaminase & lactic acid dehydrogenase 08/03/2020   Epigastric pain 08/03/2020   Liver nodule 08/03/2020   Obstruction of bile duct 08/03/2020   Cirrhosis of liver ([place]) 07/29/2020   Gastroparesis 07/29/2020   Irritable bowel syndrome 07/29/2020   Steatosis of liver 07/29/2020   MDD (major depressive disorder), recurrent episode, severe ([place]) 04/01/2020   Borderline personality disorder ([place]) 04/01/2020   Intermittent explosive disorder in adult 04/01/2020   Cholelithiasis with chronic cholecystitis 09/06/2017   Multinodular goiter (nontoxic) 11/17/2015   Diabetes mellitus without complication ([place]) [id number]   GERD (gastroesophageal reflux disease) 07/19/2014   HLD (hyperlipidemia) 02/09/2010   Obstructive sleep apnea 02/09/2010   Allergic rhinitis 02/09/2010   History of uterine cancer 02/09/2010   Past Surgical History:  Procedure Laterality Date   BIOPSY  04/25/2022   Procedure: BIOPSY;  Surgeon: Rhonda Juniper, MD;  Location: [facility];  Service: Gastroenterology;;   BRONCHIAL BIOPSY  04/18/2021   Procedure: BRONCHIAL BIOPSIES;  Surgeon: Rhonda Gobble, MD;  Location[facility] ENDOSCOPY;  Service: Pulmonary;;   BRONCHIAL BRUSHINGS  04/18/2021   Procedure: BRONCHIAL BRUSHINGS;  Surgeon: Rhonda Gobble, MD;  Location: [facility]COPY;  Service: Pulmonary;;  BRONCHIAL NEEDLE ASPIRATION BIOPSY  04/18/2021   Procedure: BRONCHIAL NEEDLE ASPIRATION BIOPSIES;  Surgeon: Rhonda Gobble, MD;  Location: [facility] ENDOSCOPY;  Service: Pulmonary;;   CARDIOVASCULAR STRESS TEST  06/09/2008   normal nuclear study w/ no ischemia/  normal LV function and wall motion , ef 83%   CHOLECYSTECTOMY N/A 09/06/2017   Procedure: LAPAROSCOPIC CHOLECYSTECTOMY WITH INTRAOPERATIVE  CHOLANGIOGRAM;  Surgeon: Rhonda Gemma, MD;  Location: [facility] ORS;  Service: General;  Laterality: N/A;   COLONOSCOPY WITH PROPOFOL N/A 04/25/2022   Procedure: COLONOSCOPY WITH PROPOFOL;  Surgeon: Rhonda Juniper, MD;  Location: [facility] ENDOSCOPY;  Service: Gastroenterology;  Laterality: N/A;   CYSTOSCOPY/RETROGRADE/URETEROSCOPY/STONE EXTRACTION WITH BASKET Left 03/08/2016   Procedure: CYSTOSCOPY/RETROGRADE/URETEROSCOPY/STONE EXTRACTION WITH BASKET, STENT PLACEMENT;  Surgeon: Rhonda Snare, MD;  Location: [facility];  Service: Urology;  Laterality: Left;   ENDOMETRIAL BIOPSY     ERCP N/A 09/07/2017   Procedure: ENDOSCOPIC RETROGRADE CHOLANGIOPANCREATOGRAPHY (ERCP);  Surgeon: Rhonda Juniper, MD;  Location: [facility];  Service: Gastroenterology;  Laterality: N/A;   ESOPHAGOGASTRODUODENOSCOPY N/A 04/25/2022   Procedure: ESOPHAGOGASTRODUODENOSCOPY (EGD);  Surgeon: Rhonda Juniper, MD;  Location: [facility];  Service: Gastroenterology;  Laterality: N/A;   ESOPHAGOGASTRODUODENOSCOPY (EGD) WITH PROPOFOL N/A 03/16/2020   Procedure: ESOPHAGOGASTRODUODENOSCOPY (EGD) WITH PROPOFOL;  Surgeon: Rhonda Juniper, MD;  Location: [facility] ENDOSCOPY;  Service: Gastroenterology;  Laterality: N/A;  unable to locate ampulla, aborted ERCP, changed to EGD   HEMOSTASIS CLIP PLACEMENT  04/25/2022   Procedure: HEMOSTASIS CLIP PLACEMENT;  Surgeon: Rhonda Juniper, MD;  Location: [facility] ENDOSCOPY;  Service: Gastroenterology;;   HEMOSTASIS CONTROL  04/25/2022   Procedure: HEMOSTASIS CONTROL;  Surgeon: Rhonda Juniper, MD;  Location: [facility] ENDOSCOPY;  Service: Gastroenterology;;   HOLMIUM LASER APPLICATION Left [id number]   Procedure: HOLMIUM LASER APPLICATION;  Surgeon: Rhonda Snare, MD;  Location: [facility];  Service: Urology;  Laterality: Left;   MOUTH SURGERY     MYRINGECOTMY W/ REMOVAL MIDDLE EAR CHOLESTEATOMA TYPE 1 FASICA TYMPANOPLASTY  09/13/2000   POLYPECTOMY  04/25/2022   Procedure: POLYPECTOMY;  Surgeon: Rhonda Juniper, MD;  Location:  [facility] ENDOSCOPY;  Service: Gastroenterology;;   ROBOTIC ASSISTED TOTAL HYSTERECTOMY WITH BILATERAL SALPINGO OOPHERECTOMY  08-02-2009   at [facility]  dr Rhonda Higgins   w/  Bilateral pelvic and para aortic lymph node dissection's   THYROIDECTOMY N/A 11/19/2015   Procedure: TOTAL THYROIDECTOMY;  Surgeon: Rhonda Gemma, MD;  Location: [facility];  Service: General;  Laterality: N/A;   TONSILLECTOMY  age 83   TRANSTHORACIC ECHOCARDIOGRAM  07/19/2014   ef 55-60%/  trivial TR   TYMPANOPLASTY Right 1993   VIDEO BRONCHOSCOPY WITH ENDOBRONCHIAL NAVIGATION Left 04/18/2021   Procedure: ROBOTIC ASSISTED BRONCHOSCOPY WITH ENDOBRONCHIAL NAVIGATION;  Surgeon: Rhonda Gobble, MD;  Location: [facility] ENDOSCOPY;  Service: Pulmonary;  Laterality: Left;   VIDEO BRONCHOSCOPY WITH RADIAL ENDOBRONCHIAL ULTRASOUND  04/18/2021   Procedure: RADIAL ENDOBRONCHIAL ULTRASOUND;  Surgeon: Rhonda Gobble, MD;  Location: [facility] ENDOSCOPY;  Service: Pulmonary;;   Current Outpatient Medications on File Prior to Visit  Medication Sig Dispense Refill   NOVOLOG MIX 70/30 FLEXPEN (70-30) 100 UNIT/ML FlexPen Inject into the skin.     ACCU-CHEK GUIDE test strip      Accu-Chek Softclix Lancets lancets daily.     acetaminophen (TYLENOL) 500 MG tablet Take 1,000 mg by mouth every 6 (six) hours as needed for mild pain.     atorvastatin (LIPITOR) 40 MG tablet Take 40 mg by mouth daily.     buPROPion (WELLBUTRIN XL) 300 MG 24 hr tablet  300 mg daily.     Cetirizine HCl (ZYRTEC ALLERGY) 10 MG CAPS Take 10 mg by mouth daily as needed (allergy).     gabapentin (NEURONTIN) 400 MG capsule Take 400-1,200 mg by mouth See admin instructions. Takes 400 mg in the morning 400 mg in the afternoon and 1200 mg at night     glimepiride (AMARYL) 4 MG tablet Take 2 mg by mouth daily with breakfast.  5   hydrOXYzine (ATARAX/VISTARIL) 25 MG tablet Take 1 tablet (25 mg total) by mouth 3 (three) times daily as needed for anxiety. 90 tablet 0   ibuprofen (ADVIL) 800 MG tablet Take 1 tablet  (800 mg total) by mouth every 8 (eight) hours as needed. (Patient taking differently: Take 800 mg by mouth every 8 (eight) hours as needed for mild pain.) 30 tablet 2   lamoTRIgine (LAMICTAL) 200 MG tablet Take 200 mg by mouth daily.     lisinopril (PRINIVIL,ZESTRIL) 5 MG tablet Take 5 mg by mouth daily.     metFORMIN (GLUCOPHAGE-XR) 500 MG 24 hr tablet Take 500 mg by mouth 2 (two) times daily.     methocarbamol (ROBAXIN) 500 MG tablet TAKE 1 TABLET BY MOUTH EVERY 8 HOURS AS NEEDED FOR MUSCLE SPASMS. (Patient taking differently: Take 500 mg by mouth every 8 (eight) hours as needed for muscle spasms.) 30 tablet 0   ondansetron (ZOFRAN) 4 MG tablet Take 4 mg by mouth 3 (three) times daily as needed for nausea or vomiting.     pantoprazole (PROTONIX) 40 MG tablet Take 40 mg by mouth 2 (two) times daily.   0   Peppermint Oil (IBGARD) 90 MG CPCR Take 2 tablets by mouth daily as needed (IBS).     promethazine (PHENERGAN) 12.5 MG tablet Take 12.5 mg by mouth 3 (three) times daily as needed for nausea/vomiting.     risperiDONE (RISPERDAL) 2 MG tablet Take 2 mg by mouth at bedtime.   2   sucralfate (CARAFATE) 1 g tablet Take 1 g by mouth daily.     SYNTHROID 175 MCG tablet Take 175 mcg by mouth daily.     traMADol (ULTRAM) 50 MG tablet Take 1 tablet (50 mg total) by mouth every 12 (twelve) hours as needed. (Patient taking differently: Take 50 mg by mouth every 12 (twelve) hours as needed for moderate pain.) 30 tablet 0   traZODone (DESYREL) 50 MG tablet Take 1 tablet (50 mg total) by mouth at bedtime.     Vitamin D, Ergocalciferol, (DRISDOL) 1.25 MG (50000 UNIT) CAPS capsule Take 50,000 Units by mouth once a week. Sunday     No current facility-administered medications on file prior to visit.    Allergies  Allergen Reactions   Hydrocodone Shortness Of Breath and Itching   Tape Rash    Paper tape is ok   Wound Dressing Adhesive Rash   Other Other (See Comments)    Pholcodine Unknown   Pholcodine  Itching   Codeine Itching   Social History   Occupational History   Occupation: n/a  Tobacco Use   Smoking status: Never   Smokeless tobacco: Never  Vaping Use   Vaping Use: Never used  Substance and Sexual Activity   Alcohol use: No   Drug use: No   Sexual activity: Yes    Birth control/protection: None   Family History  Problem Relation Age of Onset   Heart disease Sister    Heart attack Brother    Heart disease Brother    Heart  disease Sister    Diabetes Sister    Breast cancer Sister    Asthma Mother    Heart disease Mother    Diabetes Mother    Emphysema Mother    Hypertension Mother    Stroke Mother    Prostate cancer Father    There is no immunization history for the selected administration types on file for this patient.   Review of Systems: Negative except as noted in the HPI.   Objective: Vitals:   08/21/22 0930  BP: 133/65   Rhonda Higgins is a pleasant 52 y.o. female in NAD. AAO X 3.  Vascular Examination: Capillary refill time immediate b/l. Vascular status intact b/l with palpable pedal pulses. Pedal hair sparse b/l. No edema. No pain with calf compression b/l. Skin temperature gradient WNL b/l.   Neurological Examination: Sensation grossly intact b/l with 10 gram monofilament. Vibratory sensation intact b/l. Pt has subjective symptoms of neuropathy.  Dermatological Examination: Pedal skin with normal turgor, texture and tone b/l.  No open wounds. No interdigital macerations.   Toenails 1-5 b/l thick, discolored, elongated with subungual debris and pain on dorsal palpation.   Incurvated nailplate medial border of R 2nd toe.  Nail border hypertrophy absent. There is tenderness to palpation. Sign(s) of infection: no clinical signs of infection noted on examination today.. There is noted onchyolysis of nailplate of L hallux.  The nailbed(s) remain(s) intact. There is no erythema, no edema, no drainage, no underlying fluctuance. No  hyperkeratotic nor porokeratotic lesions present on today's visit.  Musculoskeletal Examination: Muscle strength 5/5 to all lower extremity muscle groups bilaterally. Wearing walking boot .  Radiographs: None  Footwear Assessment: Does the patient wear appropriate shoes? Yes. Does the patient need inserts/orthotics? Yes.  Lab Results  Component Value Date   HGBA1C 7.4 (H) 04/03/2020   ADA Risk Categorization: Low Risk :  Patient has all of the following: Intact protective sensation No prior foot ulcer  No severe deformity Pedal pulses present  Assessment: 1. Pain due to onychomycosis of toenails of both feet   2. Diabetic peripheral neuropathy associated with type 2 diabetes mellitus ([place])   3. Encounter for diabetic foot exam [facility])     Plan: -Patient was evaluated and treated. All patient's and/or POA's questions/concerns answered on today's visit. -Patient currently under care of Dr. Sherryle Lis for plantar fasciitis of RLE. -Diabetic foot examination performed today. -Continue diabetic foot care principles: inspect feet daily, monitor glucose as recommended by PCP and/or Endocrinologist, and follow prescribed diet per PCP, Endocrinologist and/or dietician. -Continue supportive shoe gear daily. -Toenails 1-5 b/l were debrided in length and girth with sterile nail nippers and dremel without iatrogenic bleeding.  -No invasive procedure(s) performed. Offending nail border debrided and curretaged R 2nd toe utilizing sterile nail nipper and currette. Border cleansed with alcohol and triple antibiotic ointment. No further treatment required by patient/caregiver. Call office if there are any concerns. -Patient/POA to call should there be question/concern in the interim. Return in about 3 months (around 11/21/2022).  Marzetta Board, DPM

## 2022-08-22 ENCOUNTER — Encounter: Payer: Self-pay | Admitting: Podiatry

## 2022-08-28 ENCOUNTER — Ambulatory Visit: Payer: Medicaid Other | Attending: Podiatry

## 2022-08-28 ENCOUNTER — Other Ambulatory Visit: Payer: Self-pay

## 2022-08-28 DIAGNOSIS — M722 Plantar fascial fibromatosis: Secondary | ICD-10-CM | POA: Insufficient documentation

## 2022-08-28 DIAGNOSIS — M6281 Muscle weakness (generalized): Secondary | ICD-10-CM | POA: Insufficient documentation

## 2022-08-28 DIAGNOSIS — M79671 Pain in right foot: Secondary | ICD-10-CM | POA: Insufficient documentation

## 2022-08-28 DIAGNOSIS — R262 Difficulty in walking, not elsewhere classified: Secondary | ICD-10-CM | POA: Diagnosis present

## 2022-08-28 NOTE — Therapy (Addendum)
OUTPATIENT PHYSICAL THERAPY LOWER EXTREMITY EVALUATION  PHYSICAL THERAPY DISCHARGE SUMMARY  Visits from Start of Care: 1  Current functional level related to goals / functional outcomes: No re-assessment of goals.    Remaining deficits: Status unknown   Education / Equipment: N/A   Patient agrees to discharge. Patient goals were not met. Patient is being discharged due to not returning since the last visit.  Patient Name: Rhonda Higgins MRN: [id number] DOB:01-29-1971, 52 y.o., female Today's Date: 08/29/2022  END OF SESSION:  PT End of Session - 08/28/22 1446     Visit Number 1    Number of Visits 17    Date for PT Re-Evaluation 10/28/22    Authorization Type [employer]    PT Start Time 1413    PT Stop Time 1446    PT Time Calculation (min) 33 min    Equipment Utilized During Treatment Other (comment)   CAM boot   Activity Tolerance Patient tolerated treatment well    Behavior During Therapy WFL for tasks assessed/performed             Past Medical History:  Diagnosis Date   Adenocarcinoma (HCC)    endometrial, FIGO GRADE 1   Allergic rhinitis    Atypical chest pain    History of   Depression    Elevated liver enzymes    GERD (gastroesophageal reflux disease)    Hematuria    History of endometrial cancer 08-02-2009   oncologist-  dr brewster/ Andrey Farmer and dr kinard/  no recurrence   endometrial adenocarinoma Stage 1B, Grade 1, FIGO--  s/p  TAH w/ BSO and pelvic lymph node dissection's and radiation therapy   History of kidney stones    History of radiation therapy    2011  pelvic intracavity brachytherapy treatment's for endometrial carcinoma   History of thyroid nodule    multinodular goiter s/p  total thyroidectomy 11-19-2015  per pathology -  adenomatoid nodules   Hyperlipidemia    Hypothyroidism, postsurgical    Insulin dependent diabetes mellitus    Type 2   Left ureteral stone    Obesity    OSA (obstructive sleep apnea)    severe OSA  per  study 03-08-2010--  noncomplant cpap (no more sleep apnes since lost weight 2019)   Overactive bladder    Personality disorder (HCC)    Polyphagia(783.6)    PONV (postoperative nausea and vomiting)    after ear surgery only one time   Right lower quadrant pain    Urgency of urination    UTI (urinary tract infection)    Past Surgical History:  Procedure Laterality Date   BIOPSY  04/25/2022   Procedure: BIOPSY;  Surgeon: Kerin Salen, MD;  Location: [facility];  Service: Gastroenterology;;   BRONCHIAL BIOPSY  04/18/2021   Procedure: BRONCHIAL BIOPSIES;  Surgeon: Leslye Peer, MD;  Location: [facility] ENDOSCOPY;  Service: Pulmonary;;   BRONCHIAL BRUSHINGS  04/18/2021   Procedure: BRONCHIAL BRUSHINGS;  Surgeon: Leslye Peer, MD;  Location: [facility] ENDOSCOPY;  Service: Pulmonary;;   BRONCHIAL NEEDLE ASPIRATION BIOPSY  04/18/2021   Procedure: BRONCHIAL NEEDLE ASPIRATION BIOPSIES;  Surgeon: Leslye Peer, MD;  Location: [facility] ENDOSCOPY;  Service: Pulmonary;;   CARDIOVASCULAR STRESS TEST  06/09/2008   normal nuclear study w/ no ischemia/  normal LV function and wall motion , ef 83%   CHOLECYSTECTOMY N/A 09/06/2017   Procedure: LAPAROSCOPIC CHOLECYSTECTOMY WITH INTRAOPERATIVE CHOLANGIOGRAM;  Surgeon: Darnell Level, MD;  Location: [facility];  Service: General;  Laterality: N/A;   COLONOSCOPY WITH PROPOFOL N/A 04/25/2022   Procedure: COLONOSCOPY WITH PROPOFOL;  Surgeon: Kerin Salen, MD;  Location: [facility];  Service: Gastroenterology;  Laterality: N/A;   CYSTOSCOPY/RETROGRADE/URETEROSCOPY/STONE EXTRACTION WITH BASKET Left 03/08/2016   Procedure: CYSTOSCOPY/RETROGRADE/URETEROSCOPY/STONE EXTRACTION WITH BASKET, STENT PLACEMENT;  Surgeon: Barron Alvine, MD;  Location: [facility];  Service: Urology;  Laterality: Left;   ENDOMETRIAL BIOPSY     ERCP N/A 09/07/2017   Procedure: ENDOSCOPIC RETROGRADE CHOLANGIOPANCREATOGRAPHY (ERCP);  Surgeon: Kerin Salen, MD;  Location: [facility]COPY;  Service:  Gastroenterology;  Laterality: N/A;   ESOPHAGOGASTRODUODENOSCOPY N/A 04/25/2022   Procedure: ESOPHAGOGASTRODUODENOSCOPY (EGD);  Surgeon: Kerin Salen, MD;  Location: [facility];  Service: Gastroenterology;  Laterality: N/A;   ESOPHAGOGASTRODUODENOSCOPY (EGD) WITH PROPOFOL N/A 03/16/2020   Procedure: ESOPHAGOGASTRODUODENOSCOPY (EGD) WITH PROPOFOL;  Surgeon: Kerin Salen, MD;  Location: [facility];  Service: Gastroenterology;  Laterality: N/A;  unable to locate ampulla, aborted ERCP, changed to EGD   HEMOSTASIS CLIP PLACEMENT  04/25/2022   Procedure: HEMOSTASIS CLIP PLACEMENT;  Surgeon: Kerin Salen, MD;  Location: [facility];  Service: Gastroenterology;;   HEMOSTASIS CONTROL  04/25/2022   Procedure: HEMOSTASIS CONTROL;  Surgeon: Kerin Salen, MD;  Location: [facility];  Service: Gastroenterology;;   HOLMIUM LASER APPLICATION Left 03/08/2016   Procedure: HOLMIUM LASER APPLICATION;  Surgeon: Barron Alvine, MD;  Location: [facility];  Service: Urology;  Laterality: Left;   MOUTH SURGERY     MYRINGECOTMY W/ REMOVAL MIDDLE EAR CHOLESTEATOMA TYPE 1 FASICA TYMPANOPLASTY  09/13/2000   POLYPECTOMY  04/25/2022   Procedure: POLYPECTOMY;  Surgeon: Kerin Salen, MD;  Location: [facility];  Service: Gastroenterology;;   ROBOTIC ASSISTED TOTAL HYSTERECTOMY WITH BILATERAL SALPINGO OOPHERECTOMY  08-02-2009   at [facility]  dr Andrey Farmer   w/  Bilateral pelvic and para aortic lymph node dissection's   THYROIDECTOMY N/A 11/19/2015   Procedure: TOTAL THYROIDECTOMY;  Surgeon: Darnell Level, MD;  Location: [facility];  Service: General;  Laterality: N/A;   TONSILLECTOMY  age 41   TRANSTHORACIC ECHOCARDIOGRAM  07/19/2014   ef 55-60%/  trivial TR   TYMPANOPLASTY Right 1993   VIDEO BRONCHOSCOPY WITH ENDOBRONCHIAL NAVIGATION Left 04/18/2021   Procedure: ROBOTIC ASSISTED BRONCHOSCOPY WITH ENDOBRONCHIAL NAVIGATION;  Surgeon: Leslye Peer, MD;  Location: [facility];  Service: Pulmonary;  Laterality: Left;   VIDEO  BRONCHOSCOPY WITH RADIAL ENDOBRONCHIAL ULTRASOUND  04/18/2021   Procedure: RADIAL ENDOBRONCHIAL ULTRASOUND;  Surgeon: Leslye Peer, MD;  Location: [facility] ENDOSCOPY;  Service: Pulmonary;;   Patient Active Problem List   Diagnosis Date Noted   History of adenomatous polyp of colon 01/17/2022   Constipation 01/17/2022   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   Thrombocytopenia (HCC) 09/09/2021   Iron deficiency anemia 06/24/2021   Status post bronchoscopy 04/20/2021   Pulmonary nodule 1 cm or greater in diameter 03/17/2021   Elevated levels of transaminase & lactic acid dehydrogenase 08/03/2020   Epigastric pain 08/03/2020   Liver nodule 08/03/2020   Obstruction of bile duct 08/03/2020   Cirrhosis of liver (HCC) 07/29/2020   Gastroparesis 07/29/2020   Irritable bowel syndrome 07/29/2020   Steatosis of liver 07/29/2020   MDD (major depressive disorder), recurrent episode, severe (HCC) 04/01/2020   Borderline personality disorder (HCC) 04/01/2020   Intermittent explosive disorder in adult 04/01/2020   Cholelithiasis with chronic cholecystitis 09/06/2017   Multinodular goiter (nontoxic) 11/17/2015   Diabetes mellitus without complication (HCC) 07/19/2014   GERD (gastroesophageal reflux disease) 07/19/2014   HLD (hyperlipidemia) 02/09/2010   Obstructive sleep apnea 02/09/2010  Allergic rhinitis 02/09/2010   History of uterine cancer 02/09/2010    PCP: Knox Royalty, MD  REFERRING PROVIDER: Edwin Cap, DPM  REFERRING DIAG: [phone] (ICD-10-CM) - Plantar fasciitis, right   THERAPY DIAG:  Pain in right foot  Muscle weakness (generalized)  Difficulty in walking, not elsewhere classified  Rationale for Evaluation and Treatment: Rehabilitation  ONSET DATE: December 2023   SUBJECTIVE:   SUBJECTIVE STATEMENT: Patient reports noting Rt foot pain in December without known cause. She remembers waking up one morning and it started hurting. She reports the pain is about the Rt  heel. She has received an injection with helped with her pain for about a day. She has been wearing a CAM boot since recent visit with Dr. Lilian Kapur on 08/08/22. She reports history of Rt ankle sprain in 2003.   PERTINENT HISTORY: History of endometrial cancer Hypothyroidism  Obesity  Personality disorder  PAIN:  Are you having pain? Yes: NPRS scale: 1 (at worst 10)/10 Pain location: Rt heel Pain description: throbbing Aggravating factors: palpation, walking, standing Relieving factors: tylenol   PRECAUTIONS: None  WEIGHT BEARING RESTRICTIONS:  wearing CAM boot for ambulation, per recent MD visit no restrictions in PT   FALLS:  Has patient fallen in last 6 months? No  LIVING ENVIRONMENT: Lives with: lives with their spouse Lives in: House/apartment Stairs: Yes: External: 2 steps; can reach both Has following equipment at home:  Rt CAM boot   OCCUPATION: unemployed   PLOF: Independent  PATIENT GOALS: "walk without the boot"    OBJECTIVE:   DIAGNOSTIC FINDINGS: Radiographs: Multiple views x-ray of the right foot: no fracture, dislocation, swelling or degenerative changes noted, plantar calcaneal spur, and posterior calcaneal spur  PATIENT SURVEYS:  LEFS 51/80  COGNITION: Overall cognitive status: Within functional limits for tasks assessed     SENSATION: WFL  EDEMA:  No obvious swelling about the foot   MUSCLE LENGTH:   POSTURE: rounded shoulders and forward head  PALPATION: TTP Rt plantar aspect of the calcaneous, 5th metatarsal   LOWER EXTREMITY ROM:  Active ROM Right eval Left eval  Hip flexion    Hip extension    Hip abduction    Hip adduction    Hip internal rotation    Hip external rotation    Knee flexion    Great toe passive extension 40 50  Ankle dorsiflexion 4 4  Ankle plantarflexion WNL   Ankle inversion WNL   Ankle eversion WNL    (Blank rows = not tested)  LOWER EXTREMITY MMT:  MMT Right eval Left eval  Hip flexion 4- 4-   Hip extension    Hip abduction 3+ 3+  Hip adduction    Hip internal rotation    Hip external rotation    Knee flexion    Knee extension    Ankle dorsiflexion 4 5  Ankle plantarflexion Partial range SL calf raise  Full range SL calf raise  Ankle inversion 4- 5  Ankle eversion 4- 5   (Blank rows = not tested)  LOWER EXTREMITY SPECIAL TESTS:  (+) Windlass   FUNCTIONAL TESTS:  SLS: 3 seconds bilateral   GAIT: Distance walked: 15 ft  Assistive device utilized: None Level of assistance: Complete Independence Comments: excessive foot ER, excessive pronation during stance    [facility] Adult PT Treatment:  DATE: 08/28/22 Therapeutic Exercise: Demonstrated and issue initial HEP.    Therapeutic Activity: Education on assessment findings that will be addressed throughout duration of POC.      PATIENT EDUCATION:  Education details: see treatment  Person educated: Patient Education method: Explanation, Demonstration, Tactile cues, Verbal cues, and Handouts Education comprehension: verbalized understanding, returned demonstration, verbal cues required, tactile cues required, and needs further education  HOME EXERCISE PROGRAM: Access Code: [email or web address] URL: [email or web address] Date: 08/28/2022 Prepared by: Letitia Libra  Exercises - Long Sitting Calf Stretch with Strap  - 1 x daily - 7 x weekly - 3 sets - 30 sec  hold - Seated Self Great Toe Stretch  - 1 x daily - 7 x weekly - 3 sets - 30 sec  hold - Seated Ankle Plantar Flexion with Resistance Loop  - 1 x daily - 7 x weekly - 2 sets - 10 reps - Seated Ankle Dorsiflexion with Resistance  - 1 x daily - 7 x weekly - 2 sets - 10 reps - Seated Figure 4 Ankle Inversion with Resistance  - 1 x daily - 7 x weekly - 2 sets - 10 reps  ASSESSMENT:  CLINICAL IMPRESSION: Patient is a 52 y.o. female who was seen today for physical therapy evaluation and treatment for Rt foot pain that  began in December 2023 of insidious onset. She reports minimal pain relief with an injection, but recent immobilization in CAM boot has been more helpful in reducing her pain. Her signs and symptoms are consistent with plantar fascitis. She will benefit from skilled PT to address her ankle/hip weakness, ankle ROM deficits, gait and balance abnormalities, and work on weaning from CAM boot once cleared by physician in order to optimize her function.   OBJECTIVE IMPAIRMENTS: Abnormal gait, decreased activity tolerance, decreased balance, decreased knowledge of condition, decreased mobility, difficulty walking, decreased ROM, decreased strength, increased fascial restrictions, impaired flexibility, postural dysfunction, and pain.   ACTIVITY LIMITATIONS: carrying, lifting, bending, standing, squatting, stairs, transfers, and locomotion level  PARTICIPATION LIMITATIONS: meal prep, cleaning, laundry, community activity, and yard work  PERSONAL FACTORS: Age, Fitness, Time since onset of injury/illness/exacerbation, and 3+ comorbidities: see PMH above  are also affecting patient's functional outcome.   REHAB POTENTIAL: Good  CLINICAL DECISION MAKING: Stable/uncomplicated  EVALUATION COMPLEXITY: Low   GOALS: Goals reviewed with patient? Yes  SHORT TERM GOALS: Target date: 09/25/2022   Patient will be independent and compliant with initial HEP.   Baseline: issued at eval  Goal status: INITIAL  2.  Patient will demonstrate at least 50 degrees of Rt great toe passive extension ROM to improve push-off during gait cycle.  Baseline: see above Goal status: INITIAL  3.  Patient will demonstrate at least 9 degrees of Rt ankle DF AROM to improve gait mechanics.  Baseline: see above  Goal status: INITIAL   LONG TERM GOALS: Target date: 10/28/22  Patient will demonstrate 5/5 Rt ankle strength to improve stability when navigating on uneven terrain.  Baseline: see above Goal status: INITIAL  2.   Patient will be able to complete full range SL calf raise on the RLE to improve push-off during gait cycle.  Baseline: see above  Goal status: INITIAL  3.  Patient will be able to walk community distances with minimal foot pain without CAM boot.  Baseline: currently utilizing CAM boot;without boot 10/10  Goal status: INITIAL  4.  Patient will maintain SLS for at least 8 seconds to improve ankle stability.  Baseline: see above  Goal status: INITIAL  5.  Patient will demonstrate 4/5 bilateral hip abductor/extensor strength to improve stability about the chain with prolonged walking/standing activity.  Baseline: see above  Goal status: INITIAL    PLAN:  PT FREQUENCY: 1-2x/week  PT DURATION: 8 weeks  PLANNED INTERVENTIONS: Therapeutic exercises, Therapeutic activity, Neuromuscular re-education, Balance training, Gait training, Patient/Family education, Self Care, Joint mobilization, Aquatic Therapy, Dry Needling, Cryotherapy, Moist heat, Manual therapy, and Re-evaluation  PLAN FOR NEXT SESSION: review and progress HEP prn; ankle/great toe mobility, foot intrinsics, consider weight shifts. MD f/u for weight-bearing status outside of PT?  Letitia Libra, PT, DPT, ATC 08/29/22 8:29 AM  Letitia Libra, PT, DPT, ATC 11/23/22 11:04 AM

## 2022-09-01 ENCOUNTER — Ambulatory Visit (HOSPITAL_COMMUNITY)
Admission: RE | Admit: 2022-09-01 | Discharge: 2022-09-01 | Disposition: A | Discharge status: Home / Self Care | Payer: Medicaid Other | Source: Ambulatory Visit | Attending: Gastroenterology | Admitting: Gastroenterology

## 2022-09-01 ENCOUNTER — Ambulatory Visit (HOSPITAL_COMMUNITY): Payer: Medicaid Other

## 2022-09-01 DIAGNOSIS — K588 Other irritable bowel syndrome: Secondary | ICD-10-CM | POA: Insufficient documentation

## 2022-09-01 DIAGNOSIS — R110 Nausea: Secondary | ICD-10-CM | POA: Insufficient documentation

## 2022-09-01 DIAGNOSIS — R109 Unspecified abdominal pain: Secondary | ICD-10-CM | POA: Diagnosis present

## 2022-09-01 MED ORDER — SODIUM CHLORIDE (PF) 0.9 % IJ SOLN
INTRAMUSCULAR | Status: AC
  Filled 2022-09-01: qty 50

## 2022-09-01 MED ORDER — IOHEXOL 9 MG/ML PO SOLN
1000.0000 mL | ORAL | Status: AC
  Administered 2022-09-01: 1000 mL via ORAL

## 2022-09-01 MED ORDER — IOHEXOL 300 MG/ML  SOLN
100.0000 mL | Freq: Once | INTRAMUSCULAR | Status: AC | PRN
  Administered 2022-09-01: 100 mL via INTRAVENOUS

## 2022-09-01 MED ORDER — IOHEXOL 9 MG/ML PO SOLN
ORAL | Status: AC
  Filled 2022-09-01: qty 1000

## 2022-09-04 ENCOUNTER — Other Ambulatory Visit (INDEPENDENT_AMBULATORY_CARE_PROVIDER_SITE_OTHER): Payer: Medicaid Other

## 2022-09-04 ENCOUNTER — Ambulatory Visit (INDEPENDENT_AMBULATORY_CARE_PROVIDER_SITE_OTHER): Payer: Medicaid Other | Admitting: Surgical

## 2022-09-04 DIAGNOSIS — M542 Cervicalgia: Secondary | ICD-10-CM | POA: Diagnosis not present

## 2022-09-04 DIAGNOSIS — M898X1 Other specified disorders of bone, shoulder: Secondary | ICD-10-CM

## 2022-09-04 MED ORDER — CYCLOBENZAPRINE HCL 10 MG PO TABS: 10.0000 mg | ORAL_TABLET | Freq: Three times a day (TID) | ORAL | 0 refills | Status: DC

## 2022-09-04 MED ORDER — CELECOXIB 100 MG PO CAPS: 100.0000 mg | ORAL_CAPSULE | Freq: Two times a day (BID) | ORAL | 1 refills | Status: DC

## 2022-09-06 ENCOUNTER — Ambulatory Visit (INDEPENDENT_AMBULATORY_CARE_PROVIDER_SITE_OTHER): Payer: Medicaid Other | Admitting: Podiatry

## 2022-09-06 DIAGNOSIS — B351 Tinea unguium: Secondary | ICD-10-CM | POA: Diagnosis not present

## 2022-09-06 DIAGNOSIS — M79674 Pain in right toe(s): Secondary | ICD-10-CM

## 2022-09-06 DIAGNOSIS — M722 Plantar fascial fibromatosis: Secondary | ICD-10-CM

## 2022-09-06 DIAGNOSIS — M79675 Pain in left toe(s): Secondary | ICD-10-CM

## 2022-09-06 NOTE — Progress Notes (Signed)
  Subjective:  Patient ID: Rhonda Higgins, female    DOB: Jul 01, 1970,  MRN: [id number]  Chief Complaint  Patient presents with   Plantar Fasciitis    1 month follow up right foot   Nail Problem    Patient has 2 toenails that Dr Elisha Ponder wants Dr Sherryle Lis to look ate    52 y.o. female presents with the above complaint. History confirmed with patient.  Pain is doing much better after the last injection she is not hurting at all now, the boot has been helpful as well  Objective:  Physical Exam: warm, good capillary refill, no trophic changes or ulcerative lesions, normal DP and PT pulses, normal sensory exam, and no pain to palpation on plantar or lateral heel today, left hallux and right second toenail show onychomycosis thickening discoloration and dystrophy   Radiographs: Multiple views x-ray of the right foot: no fracture, dislocation, swelling or degenerative changes noted, plantar calcaneal spur, and posterior calcaneal spur Assessment:   1. Plantar fasciitis, right      Plan:  Patient was evaluated and treated and all questions answered.  Doing much better.  She will complete her physical therapy course and transition out of the cam boot back to a regular supportive shoe.  Inspected her toenails that were in question and there is onychomycosis but currently that is not painful, routine debridement is been able to alleviate this.  As long as the nails are not causing significant pain or recurrent infections then I do not think that removal or matricectomy is necessary.  Advised her to continue to monitor these areas and if they worsen or return to let me know and she will return to see me for the procedures.  Return if symptoms worsen or fail to improve.

## 2022-09-09 ENCOUNTER — Encounter: Payer: Self-pay | Admitting: Surgical

## 2022-09-09 NOTE — Progress Notes (Signed)
Office Visit Note   Patient: Rhonda Higgins           Date of Birth: 05-28-1971           MRN: [id number] Visit Date: 09/04/2022 Requested by: [name], MD [address] [place],  [place] [address] PCP: [name], MD  Subjective: Chief Complaint  Patient presents with   Right Shoulder - Pain    HPI: Rhonda Higgins is a 52 y.o. female who presents to the office reporting bilateral shoulder pain.  Patient states she has no history of injury.  She complains about 1 week of symptoms.  Notes that primarily affects the right shoulder and right trapezius region with neck pain that travels into the shoulder.  She has increased pain with neck range of motion and she feels her neck is very stiff.  She has constant pain.  Tylenol has not been helping at all.  Recent A1c of 8.8.  She has tried physical therapy stretching exercises as well as Tylenol and heat.  She denies any scapular pain, numbness/tingling.  Is really difficult to lift the right side and look down.  This pain is keeping her up at night.  No change in her gait.  No weakness that she has noticed..                ROS: All systems reviewed are negative as they relate to the chief complaint within the history of present illness.  Patient denies fevers or chills.  Assessment & Plan: Visit Diagnoses:  1. Neck pain     Plan: Patient is a 52 year old female who presents for evaluation of bilateral shoulder pain and neck pain.  She has history of cervical spine pathology for which she has been seen before.  States that she woke up with neck and primarily right shoulder/trapezius pain about a week ago.  Her A1c is unfortunately too high to try steroid Dosepak or C-spine ESI.  She does not really like to try C-spine ESI's anyway because they have given her panic attacks in the past.  Plan to try Celebrex 100 mg twice daily and Flexeril 10 mg 3 times daily as needed.  Follow-up in 4 weeks for clinical recheck with Dr.  Marlou Sa  Follow-Up Instructions: No follow-ups on file.   Orders:  Orders Placed This Encounter  Procedures   XR Cervical Spine 2 or 3 views   Meds ordered this encounter  Medications   celecoxib (CELEBREX) 100 MG capsule    Sig: Take 1 capsule (100 mg total) by mouth 2 (two) times daily.    Dispense:  60 capsule    Refill:  1   cyclobenzaprine (FLEXERIL) 10 MG tablet    Sig: Take 1 tablet (10 mg total) by mouth 3 (three) times daily.    Dispense:  30 tablet    Refill:  0      Procedures: No procedures performed   Clinical Data: No additional findings.  Objective: Vital Signs: There were no vitals taken for this visit.  Physical Exam:  Constitutional: Patient appears well-developed HEENT:  Head: Normocephalic Eyes:EOM are normal Neck: Normal range of motion Cardiovascular: Normal rate Pulmonary/chest: Effort normal Neurologic: Patient is alert Skin: Skin is warm Psychiatric: Patient has normal mood and affect  Ortho Exam: Ortho exam demonstrates right shoulder with well-preserved passive and active range of motion.  Good rotator cuff strength of supra, infra, subscap of the right shoulder.  Axillary nerve intact with deltoid firing.  She has 2+ radial pulse of the right upper extremity.  She has increased pain with cervical spine range of motion with particular stiffness noted when she tries to look to her right.  Tenderness throughout the axial cervical spine, worst at around C4-C5.  Negative Spurling sign.  Negative Lhermitte sign.  Bicep tendon reflexes are equivalent compared to the contralateral side.  No weakness of grip strength, pronation/supination, bicep, tricep, deltoid.  Specialty Comments:  No specialty comments available.  Imaging: No results found.   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    Thrombocytopenia ([place]) 09/09/2021   Iron deficiency anemia 06/24/2021   Status post bronchoscopy 04/20/2021   Pulmonary nodule 1 cm or greater in diameter 03/17/2021   Elevated levels of transaminase & lactic acid dehydrogenase 08/03/2020   Epigastric pain 08/03/2020   Liver nodule 08/03/2020   Obstruction of bile duct 08/03/2020   Cirrhosis of liver ([place]) 07/29/2020   Gastroparesis 07/29/2020   Irritable bowel syndrome 07/29/2020   Steatosis of liver 07/29/2020   MDD (major depressive disorder), recurrent episode, severe ([place]) 04/01/2020   Borderline personality disorder ([place]) 04/01/2020   Intermittent explosive disorder in adult 04/01/2020   Cholelithiasis with chronic cholecystitis 09/06/2017   Multinodular goiter (nontoxic) 11/17/2015   Diabetes mellitus without complication ([place]) [id number]   GERD (gastroesophageal reflux disease) 07/19/2014   HLD (hyperlipidemia) 02/09/2010   Obstructive sleep apnea 02/09/2010   Allergic rhinitis 02/09/2010   History of uterine cancer 02/09/2010   Past Medical History:  Diagnosis Date   Adenocarcinoma ([place])    endometrial, FIGO GRADE 1   Allergic rhinitis    Atypical chest pain    History of   Depression    Elevated liver enzymes    GERD (gastroesophageal reflux disease)    Hematuria    History of endometrial cancer 08-02-2009   oncologist-  dr brewster/ [facility]man George and dr kinard/  no recurrence   endometrial adenocarinoma Stage 1B, Grade 1, FIGO--  s/p  TAH w/ BSO and pelvic lymph node dissection's and radiation therapy   History of kidney stones    History of radiation therapy    2011  pelvic intracavity brachytherapy treatment's for endometrial carcinoma   History of thyroid nodule    multinodular goiter s/p  total thyroidectomy 11-19-2015  per pathology -  adenomatoid nodules   Hyperlipidemia    Hypothyroidism, postsurgical    Insulin dependent diabetes mellitus    Type 2   Left ureteral stone    Obesity    OSA (obstructive sleep  apnea)    severe OSA  per study 03-08-2010--  noncomplant cpap (no more sleep apnes since lost weight 2019)   Overactive bladder    Personality disorder (HCC)    Polyphagia(783.6)    PONV (postoperative nausea and vomiting)    after ear surgery only one time   Right lower quadrant pain    Urgency of urination    UTI (urinary tract infection)     Family History  Problem Relation Age of Onset   Heart disease Sister    Heart attack Brother    Heart disease Brother    Heart disease Sister    Diabetes Sister    Breast cancer Sister    Asthma Mother    Heart disease Mother    Diabetes Mother    Emphysema Mother    Hypertension Mother  Stroke Mother    Prostate cancer Father     Past Surgical History:  Procedure Laterality Date   BIOPSY  04/25/2022   Procedure: BIOPSY;  Surgeon: [name], MD;  Location: [facility];  Service: Gastroenterology;;   BRONCHIAL BIOPSY  04/18/2021   Procedure: BRONCHIAL BIOPSIES;  Surgeon: Collene Gobble, MD;  Location[facility] ENDOSCOPY;  Service: Pulmonary;;   BRONCHIAL BRUSHINGS  04/18/2021   Procedure: BRONCHIAL BRUSHINGS;  Surgeon: Collene Gobble, MD;  Location: [facility] ENDOSCOPY;  Service: Pulmonary;;   BRONCHIAL NEEDLE ASPIRATION BIOPSY  04/18/2021   Procedure: BRONCHIAL NEEDLE ASPIRATION BIOPSIES;  Surgeon: Collene Gobble, MD;  Location: [facility] ENDOSCOPY;  Service: Pulmonary;;   CARDIOVASCULAR STRESS TEST  06/09/2008   normal nuclear study w/ no ischemia/  normal LV function and wall motion , ef 83%   CHOLECYSTECTOMY N/A 09/06/2017   Procedure: LAPAROSCOPIC CHOLECYSTECTOMY WITH INTRAOPERATIVE CHOLANGIOGRAM;  Surgeon: Armandina Gemma, MD;  Location: [facility];  Service: General;  Laterality: N/A;   COLONOSCOPY WITH PROPOFOL N/A 04/25/2022   Procedure: COLONOSCOPY WITH PROPOFOL;  Surgeon: [name], MD;  Location: [facility];  Service: Gastroenterology;  Laterality: N/A;   CYSTOSCOPY/RETROGRADE/URETEROSCOPY/STONE EXTRACTION WITH BASKET Left 03/08/2016    Procedure: CYSTOSCOPY/RETROGRADE/URETEROSCOPY/STONE EXTRACTION WITH BASKET, STENT PLACEMENT;  Surgeon: Rana Snare, MD;  Location: [facility];  Service: Urology;  Laterality: Left;   ENDOMETRIAL BIOPSY     ERCP N/A 09/07/2017   Procedure: ENDOSCOPIC RETROGRADE CHOLANGIOPANCREATOGRAPHY (ERCP);  Surgeon: [name], MD;  Location: [facility];  Service: Gastroenterology;  Laterality: N/A;   ESOPHAGOGASTRODUODENOSCOPY N/A 04/25/2022   Procedure: ESOPHAGOGASTRODUODENOSCOPY (EGD);  Surgeon: [name], MD;  Location: [facility];  Service: Gastroenterology;  Laterality: N/A;   ESOPHAGOGASTRODUODENOSCOPY (EGD) WITH PROPOFOL N/A 03/16/2020   Procedure: ESOPHAGOGASTRODUODENOSCOPY (EGD) WITH PROPOFOL;  Surgeon: [name], MD;  [facility];  Service: Gastroenterology;  Laterality: N/A;  unable to locate ampulla, aborted ERCP, changed to EGD   HEMOSTASIS CLIP PLACEMENT  04/25/2022   Procedure: HEMOSTASIS CLIP PLACEMENT;  Surgeon: [name], MD;  [facility];  Service: Gastroenterology;;   HEMOSTASIS CONTROL  04/25/2022   Procedure: HEMOSTASIS CONTROL;  Surgeon: [name], MD;  [facility];  Service: Gastroenterology;;   HOLMIUM LASER APPLICATION Left [id number]   Procedure: HOLMIUM LASER APPLICATION;  Surgeon: Rana Snare, MD;  Location: [facility];  Service: Urology;  Laterality: Left;   MOUTH SURGERY     MYRINGECOTMY W/ REMOVAL MIDDLE EAR CHOLESTEATOMA TYPE 1 FASICA TYMPANOPLASTY  09/13/2000   POLYPECTOMY  04/25/2022   Procedure: POLYPECTOMY;  Surgeon: [name], MD;  [facility];  Service: Gastroenterology;;   ROBOTIC ASSISTED TOTAL HYSTERECTOMY WITH BILATERAL SALPINGO OOPHERECTOMY  08-02-2009   at [facility]  dr Denman George   w/  Bilateral pelvic and para aortic lymph node dissection's   THYROIDECTOMY N/A 11/19/2015   Procedure: TOTAL THYROIDECTOMY;  Surgeon: Armandina Gemma, MD;  [facility]: [facility];  Service: General;  Laterality: N/A;    TONSILLECTOMY  age 66   TRANSTHORACIC ECHOCARDIOGRAM  07/19/2014   ef 55-60%/  trivial TR   TYMPANOPLASTY Right 1993   VIDEO BRONCHOSCOPY WITH ENDOBRONCHIAL NAVIGATION Left 04/18/2021   Procedure: ROBOTIC ASSISTED BRONCHOSCOPY WITH ENDOBRONCHIAL NAVIGATION;  Surgeon: Collene Gobble, MD;  Location: [facility] ENDOSCOPY;  Service: Pulmonary;  Laterality: Left;   VIDEO BRONCHOSCOPY WITH RADIAL ENDOBRONCHIAL ULTRASOUND  04/18/2021   Procedure: RADIAL ENDOBRONCHIAL ULTRASOUND;  Surgeon: Collene Gobble, MD;  Location: [facility] ENDOSCOPY;  Service: Pulmonary;;   Social History   Occupational  History   Occupation: n/a  Tobacco Use   Smoking status: Never   Smokeless tobacco: Never  Vaping Use   Vaping Use: Never used  Substance and Sexual Activity   Alcohol use: No   Drug use: No   Sexual activity: Yes    Birth control/protection: None

## 2022-09-13 ENCOUNTER — Encounter (HOSPITAL_COMMUNITY): Payer: Self-pay | Admitting: *Deleted

## 2022-09-13 ENCOUNTER — Other Ambulatory Visit: Payer: Self-pay

## 2022-09-13 ENCOUNTER — Emergency Department (HOSPITAL_COMMUNITY)
Admission: EM | Admit: 2022-09-13 | Discharge: 2022-09-13 | Discharge status: Against Medical Advice | Payer: Medicaid Other | Attending: Emergency Medicine | Admitting: Emergency Medicine

## 2022-09-13 DIAGNOSIS — R739 Hyperglycemia, unspecified: Secondary | ICD-10-CM | POA: Diagnosis present

## 2022-09-13 DIAGNOSIS — Z5321 Procedure and treatment not carried out due to patient leaving prior to being seen by health care provider: Secondary | ICD-10-CM | POA: Insufficient documentation

## 2022-09-13 LAB — URINALYSIS, ROUTINE W REFLEX MICROSCOPIC
Bilirubin Urine: NEGATIVE
Glucose, UA: >=500 mg/dL — AB
Hgb urine dipstick: NEGATIVE
Ketones, ur: NEGATIVE mg/dL
Leukocytes,Ua: NEGATIVE
Nitrite: NEGATIVE
Protein, ur: NEGATIVE mg/dL
Specific Gravity, Urine: 1.03 (ref 1.005–1.030)
pH: 7 (ref 5.0–8.0)

## 2022-09-13 LAB — BASIC METABOLIC PANEL
Anion gap: 9 (ref 5–15)
BUN: 8 mg/dL (ref 6–20)
CO2: 24 mmol/L (ref 22–32)
Calcium: 7.9 mg/dL — ABNORMAL LOW (ref 8.9–10.3)
Chloride: 101 mmol/L (ref 98–111)
Creatinine, Ser: 0.63 mg/dL (ref 0.44–1.00)
GFR, Estimated: >60 mL/min (ref >60)
Glucose, Bld: 439 mg/dL — ABNORMAL HIGH (ref 70–99)
Potassium: 3.4 mmol/L — ABNORMAL LOW (ref 3.5–5.1)
Sodium: 134 mmol/L — ABNORMAL LOW (ref 135–145)

## 2022-09-13 LAB — CBC
HCT: 35.3 % — ABNORMAL LOW (ref 36.0–46.0)
Hemoglobin: 10.4 g/dL — ABNORMAL LOW (ref 12.0–15.0)
MCH: 21.8 pg — ABNORMAL LOW (ref 26.0–34.0)
MCHC: 29.5 g/dL — ABNORMAL LOW (ref 30.0–36.0)
MCV: 73.8 fL — ABNORMAL LOW (ref 80.0–100.0)
Platelets: 83 10*3/uL — ABNORMAL LOW (ref 150–400)
RBC: 4.78 MIL/uL (ref 3.87–5.11)
RDW: 16.6 % — ABNORMAL HIGH (ref 11.5–15.5)
WBC: 2.9 10*3/uL — ABNORMAL LOW (ref 4.0–10.5)
nRBC: 0 % (ref 0.0–0.2)

## 2022-09-13 LAB — CBG MONITORING, ED: Glucose-Capillary: 402 mg/dL — ABNORMAL HIGH (ref 70–99)

## 2022-09-13 NOTE — ED Triage Notes (Signed)
Pt c/o hyperglycemia today, unable to pick up her metformin from the pharmacy. Reports increased thirst and urination

## 2022-09-13 NOTE — ED Notes (Signed)
Pt handed stickers to registration  and left

## 2022-09-14 ENCOUNTER — Ambulatory Visit: Payer: Medicaid Other

## 2022-09-20 ENCOUNTER — Encounter: Payer: Self-pay | Admitting: Surgical

## 2022-09-20 ENCOUNTER — Ambulatory Visit (INDEPENDENT_AMBULATORY_CARE_PROVIDER_SITE_OTHER): Payer: Medicaid Other | Admitting: Surgical

## 2022-09-20 DIAGNOSIS — M5412 Radiculopathy, cervical region: Secondary | ICD-10-CM

## 2022-09-20 MED ORDER — MELOXICAM 15 MG PO TABS: 15.0000 mg | ORAL_TABLET | Freq: Every day | ORAL | 0 refills | Status: DC

## 2022-09-20 NOTE — Progress Notes (Signed)
Follow-up Office Visit Note   Patient: Rhonda Higgins           Date of Birth: 09-04-1970           MRN: [id number] Visit Date: 09/20/2022 Requested by: [name], MD [address] [place],  [place] [address] PCP: [name], MD  Subjective: Chief Complaint  Patient presents with   Right Arm - Pain    HPI: Rhonda Higgins Higgins is a 52 y.o. female who returns to the office for follow-up visit.    Plan at last visit was: Patient is a 52 year old female who presents for evaluation of bilateral shoulder pain and neck pain.  She has history of cervical spine pathology for which she has been seen before.  States that she woke up with neck and primarily right shoulder/trapezius pain about a week ago.  Her A1c is unfortunately too high to try steroid Dosepak or C-spine ESI.  She does not really like to try C-spine ESI's anyway because they have given her panic attacks in the past.  Plan to try Celebrex 100 mg twice daily and Flexeril 10 mg 3 times daily as needed.  Follow-up in 4 weeks for clinical recheck with Dr. Marlou Sa   Since then, patient notes the trapezial and neck pain has somewhat improved but now she notes radicular right arm pain that travels from her shoulder down to her fingertips.  She has to keep her arm warm to help control the pain.  This began 1 week ago without any injury.  She just woke up this way.  It has been waking her up at night.  Denies any change in her gait or any new blurry vision.  She has no weakness that she has abruptly noticed.  She has been taking Celebrex and Flexeril which has not provided any significant relief.              ROS: All systems reviewed are negative as they relate to the chief complaint within the history of present illness.  Patient denies fevers or chills.  Assessment & Plan: Visit Diagnoses:  1. Cervical radiculopathy     Plan: Rhonda Higgins Higgins is a 52 y.o. female who returns to the office for follow-up visit for  neck and arm pain.  Plan from last visit was noted above in HPI.  They now return with some improvement of neck pain but continued radicular right arm pain.  She has had multiple visits over the last several years with intermittent episodes of neck pain and radicular arm pain, primarily in the right arm.  This has not really responded to physical therapy, anti-inflammatory medications, and she has had prior cervical spine ESI with induction of panic attacks.  Steroid packs not really an option for her because of her diabetes which is not well-controlled.  She does have prior MRI from 2021 of the cervical spine demonstrating moderate spinal stenosis at that time with some cord flattening.  With symptoms that are continually returning and flaring up intermittently and seem to be becoming more frequent, plan for repeat MRI of the cervical spine for further evaluation of progression of stenosis.  Will also try meloxicam today to see if this will help her symptoms more than the Celebrex she is currently taking.  Follow-Up Instructions: No follow-ups on file.   Orders:  Orders Placed This Encounter  Procedures   MR Cervical Spine w/o contrast   Meds ordered this encounter  Medications   meloxicam (  MOBIC) 15 MG tablet    Sig: Take 1 tablet (15 mg total) by mouth daily.    Dispense:  30 tablet    Refill:  0      Procedures: No procedures performed   Clinical Data: No additional findings.  Objective: Vital Signs: There were no vitals taken for this visit.  Physical Exam:  Constitutional: Patient appears well-developed HEENT:  Head: Normocephalic Eyes:EOM are normal Neck: Normal range of motion Cardiovascular: Normal rate Pulmonary/chest: Effort normal Neurologic: Patient is alert Skin: Skin is warm Psychiatric: Patient has normal mood and affect  Ortho Exam: Ortho exam demonstrates intact EPL, FPL, finger abduction, pronation/supination, bicep, tricep, deltoid without any weakness  bilaterally.  She has 1+ bicep tendon reflexes bilaterally.  Negative Spurling sign.  Negative Lhermitte sign.  Specialty Comments:  No specialty comments available.  Imaging: No results found.   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   Thrombocytopenia 09/09/2021   Iron deficiency anemia 06/24/2021   Status post bronchoscopy 04/20/2021   Pulmonary nodule 1 cm or greater in diameter 03/17/2021   Elevated levels of transaminase & lactic acid dehydrogenase 08/03/2020   Epigastric pain 08/03/2020   Liver nodule 08/03/2020   Obstruction of bile duct 08/03/2020   Cirrhosis of liver 07/29/2020   Gastroparesis 07/29/2020   Irritable bowel syndrome 07/29/2020   Steatosis of liver 07/29/2020   MDD (major depressive disorder), recurrent episode, severe 04/01/2020   Borderline personality disorder 04/01/2020   Intermittent explosive disorder in adult 04/01/2020   Cholelithiasis with chronic cholecystitis 09/06/2017   Multinodular goiter (nontoxic) 11/17/2015   Diabetes mellitus without complication [id number]   GERD (gastroesophageal reflux disease) 07/19/2014   HLD (hyperlipidemia) 02/09/2010   Obstructive sleep apnea 02/09/2010   Allergic rhinitis 02/09/2010   History of uterine cancer 02/09/2010   Past Medical History:  Diagnosis Date   Adenocarcinoma ([place])    endometrial, FIGO GRADE 1   Allergic rhinitis    Atypical chest pain    History of   Depression    Elevated liver enzymes    GERD (gastroesophageal reflux disease)    Hematuria    History of endometrial cancer 08-02-2009   oncologist-  dr brewster/ Denman George and dr kinard/  no recurrence   endometrial adenocarinoma Stage 1B, Grade 1, FIGO--  s/p  TAH w/ BSO and pelvic lymph node dissection's and radiation therapy   History of kidney stones    History of radiation therapy     2011  pelvic intracavity brachytherapy treatment's for endometrial carcinoma   History of thyroid nodule    multinodular goiter s/p  total thyroidectomy 11-19-2015  per pathology -  adenomatoid nodules   Hyperlipidemia    Hypothyroidism, postsurgical    Insulin dependent diabetes mellitus    Type 2   Left ureteral stone    Obesity    OSA (obstructive sleep apnea)    severe OSA  per study 03-08-2010--  noncomplant cpap (no more sleep apnes since lost weight 2019)   Overactive bladder    Personality disorder (HCC)    Polyphagia(783.6)    PONV (postoperative nausea and vomiting)    after ear surgery only one time   Right lower quadrant pain    Urgency of urination    UTI (urinary tract infection)     Family History  Problem Relation Age of Onset   Heart disease Sister  Heart attack Brother    Heart disease Brother    Heart disease Sister    Diabetes Sister    Breast cancer Sister    Asthma Mother    Heart disease Mother    Diabetes Mother    Emphysema Mother    Hypertension Mother    Stroke Mother    Prostate cancer Father     Past Surgical History:  Procedure Laterality Date   BIOPSY  04/25/2022   Procedure: BIOPSY;  Surgeon: [name], MD;  Location: [facility];  Service: Gastroenterology;;   BRONCHIAL BIOPSY  04/18/2021   Procedure: BRONCHIAL BIOPSIES;  Surgeon: Collene Gobble, MD;  Location: [facility];  Service: Pulmonary;;   BRONCHIAL BRUSHINGS  04/18/2021   Procedure: BRONCHIAL BRUSHINGS;  Surgeon: Collene Gobble, MD;  Location: [facility];  Service: Pulmonary;;   BRONCHIAL NEEDLE ASPIRATION BIOPSY  04/18/2021   Procedure: BRONCHIAL NEEDLE ASPIRATION BIOPSIES;  Surgeon: Collene Gobble, MD;  Location: [facility];  Service: Pulmonary;;   CARDIOVASCULAR STRESS TEST  06/09/2008   normal nuclear study w/ no ischemia/  normal LV function and wall motion , ef 83%   CHOLECYSTECTOMY N/A 09/06/2017   Procedure: LAPAROSCOPIC CHOLECYSTECTOMY WITH INTRAOPERATIVE  CHOLANGIOGRAM;  Surgeon: Armandina Gemma, MD;  Location: [facility];  Service: General;  Laterality: N/A;   COLONOSCOPY WITH PROPOFOL N/A 04/25/2022   Procedure: COLONOSCOPY WITH PROPOFOL;  Surgeon: [name], MD;  Location: [facility];  Service: Gastroenterology;  Laterality: N/A;   CYSTOSCOPY/RETROGRADE/URETEROSCOPY/STONE EXTRACTION WITH BASKET Left 03/08/2016   Procedure: CYSTOSCOPY/RETROGRADE/URETEROSCOPY/STONE EXTRACTION WITH BASKET, STENT PLACEMENT;  Surgeon: Rana Snare, MD;  Location[facility];  Service: Urology;  Laterality: Left;   ENDOMETRIAL BIOPSY     ERCP N/A 09/07/2017   Procedure: ENDOSCOPIC RETROGRADE CHOLANGIOPANCREATOGRAPHY (ERCP);  Surgeon: [name], MD;  Location: [facility];  Service: Gastroenterology;  Laterality: N/A;   ESOPHAGOGASTRODUODENOSCOPY N/A 04/25/2022   Procedure: ESOPHAGOGASTRODUODENOSCOPY (EGD);  Surgeon: [name], MD;  Location: [facility];  Service: Gastroenterology;  Laterality: N/A;   ESOPHAGOGASTRODUODENOSCOPY (EGD) WITH PROPOFOL N/A 03/16/2020   Procedure: ESOPHAGOGASTRODUODENOSCOPY (EGD) WITH PROPOFOL;  Surgeon: [name], MD;  [facility];  Service: Gastroenterology;  Laterality: N/A;  unable to locate ampulla, aborted ERCP, changed to EGD   HEMOSTASIS CLIP PLACEMENT  04/25/2022   Procedure: HEMOSTASIS CLIP PLACEMENT;  Surgeon: [name], MD;  [facility]: [facility];  Service: Gastroenterology;;   HEMOSTASIS CONTROL  04/25/2022   Procedure: HEMOSTASIS CONTROL;  Surgeon: [name], MD;  Location: [facility];  Service: Gastroenterology;;   HOLMIUM LASER APPLICATION Left [id number]   Procedure: HOLMIUM LASER APPLICATION;  Surgeon: Rana Snare, MD;  Location: [facility];  Service: Urology;  Laterality: Left;   MOUTH SURGERY     MYRINGECOTMY W/ REMOVAL MIDDLE EAR CHOLESTEATOMA TYPE 1 FASICA TYMPANOPLASTY  09/13/2000   POLYPECTOMY  04/25/2022   Procedure: POLYPECTOMY;  Surgeon: [name], MD;  Location:  [facility];  Service: Gastroenterology;;   ROBOTIC ASSISTED TOTAL HYSTERECTOMY WITH BILATERAL SALPINGO OOPHERECTOMY  08-02-2009   at [facility]  dr Denman George   w/  Bilateral pelvic and para aortic lymph node dissection's   THYROIDECTOMY N/A 11/19/2015   Procedure: TOTAL THYROIDECTOMY;  Surgeon: Armand[place] Gemma, MD;  Location: [facility];  Service: General;  Laterality: N/A;   TONSILLECTOMY  age 57   TRANSTHORACIC ECHOCARDIOGRAM  07/19/2014   ef 55-60%/  trivial TR   TYMPANOPLASTY Right 1993   VIDEO BRONCHOSCOPY WITH ENDOBRONCHIAL NAVIGATION Left 04/18/2021   Procedure: ROBOTIC ASSISTED BRONCHOSCOPY  WITH ENDOBRONCHIAL NAVIGATION;  Surgeon: Collene Gobble, MD;  Location[facility]COPY;  Service: Pulmonary;  Laterality: Left;   VIDEO BRONCHOSCOPY WITH RADIAL ENDOBRONCHIAL ULTRASOUND  04/18/2021   Procedure: RADIAL ENDOBRONCHIAL ULTRASOUND;  Surgeon: Collene Gobble, MD;  Location: [facility]OSCOPY;  Service: Pulmonary;;   Social History   Occupational History   Occupation: n/a  Tobacco Use   Smoking status: Never   Smokeless tobacco: Never  Vaping Use   Vaping Use: Never used  Substance and Sexual Activity   Alcohol use: No   Drug use: No   Sexual activity: Yes    Birth control/protection: None

## 2022-09-27 ENCOUNTER — Telehealth: Payer: Self-pay | Admitting: Surgical

## 2022-10-02 ENCOUNTER — Ambulatory Visit: Payer: Medicaid Other | Admitting: Orthopedic Surgery

## 2022-10-06 ENCOUNTER — Telehealth: Payer: Self-pay | Admitting: Surgical

## 2022-10-06 NOTE — Telephone Encounter (Signed)
Thanks Rhonda Higgins

## 2022-10-06 NOTE — Telephone Encounter (Signed)
I called patient and advised MRI has not been approved and I would cancel return appt with Dr. August Saucer. She will reschedule post MRI if it is approved.

## 2022-10-06 NOTE — Telephone Encounter (Signed)
Patient advising she is having numbness on right side of neck and arm/ please advise. Sending call to Triage

## 2022-10-06 NOTE — Telephone Encounter (Signed)
I spoke with patient. She is now experiencing numbness down her right arm and into her middle finger. MRI cervical spine is currently in appeals for authorization and may not have a decision until 10/29/2022.  I did explain to patient that numbness can be a symptom with neck issues. Patient just wants you to be aware of the new symptoms.

## 2022-10-08 ENCOUNTER — Other Ambulatory Visit: Payer: Medicaid Other

## 2022-10-11 ENCOUNTER — Ambulatory Visit: Payer: Medicaid Other | Admitting: Orthopedic Surgery

## 2022-10-16 ENCOUNTER — Other Ambulatory Visit: Payer: Self-pay | Admitting: Surgical

## 2022-10-25 ENCOUNTER — Ambulatory Visit: Payer: Medicaid Other | Admitting: Dietician

## 2022-11-12 ENCOUNTER — Ambulatory Visit
Admission: RE | Admit: 2022-11-12 | Discharge: 2022-11-12 | Disposition: A | Discharge status: Home / Self Care | Payer: Medicaid Other | Source: Ambulatory Visit | Attending: Surgical | Admitting: Surgical

## 2022-11-12 DIAGNOSIS — M5412 Radiculopathy, cervical region: Secondary | ICD-10-CM

## 2022-11-15 ENCOUNTER — Other Ambulatory Visit: Payer: Self-pay | Admitting: Surgical

## 2022-11-16 ENCOUNTER — Telehealth: Payer: Self-pay | Admitting: Orthopedic Surgery

## 2022-11-16 NOTE — Telephone Encounter (Signed)
Patient called in stating she is starting to get pain and numbness is her arm and both are consistent the medication (Meloxicam) is no longer working

## 2022-11-17 ENCOUNTER — Emergency Department (HOSPITAL_COMMUNITY)
Admission: EM | Admit: 2022-11-17 | Discharge: 2022-11-18 | Disposition: A | Discharge status: Home / Self Care | Payer: Medicaid Other | Attending: Emergency Medicine | Admitting: Emergency Medicine

## 2022-11-17 ENCOUNTER — Telehealth: Payer: Self-pay

## 2022-11-17 ENCOUNTER — Encounter (HOSPITAL_COMMUNITY): Payer: Self-pay

## 2022-11-17 ENCOUNTER — Other Ambulatory Visit: Payer: Self-pay

## 2022-11-17 DIAGNOSIS — M25511 Pain in right shoulder: Secondary | ICD-10-CM | POA: Insufficient documentation

## 2022-11-17 DIAGNOSIS — R200 Anesthesia of skin: Secondary | ICD-10-CM | POA: Diagnosis not present

## 2022-11-17 DIAGNOSIS — M542 Cervicalgia: Secondary | ICD-10-CM | POA: Diagnosis present

## 2022-11-17 DIAGNOSIS — M5412 Radiculopathy, cervical region: Secondary | ICD-10-CM | POA: Insufficient documentation

## 2022-11-17 MED ORDER — METHOCARBAMOL 500 MG PO TABS: 500.0000 mg | ORAL_TABLET | Freq: Three times a day (TID) | ORAL | 0 refills | Status: DC | PRN

## 2022-11-17 NOTE — Discharge Instructions (Signed)
You have neck pain, possibly from a cervical strain and/or pinched nerve.  ° °SEEK IMMEDIATE MEDICAL ATTENTION IF: °You develop difficulties swallowing or breathing.  °You have new or worse numbness, weakness, tingling, or movement problems in your arms or legs.  °You develop increasing pain which is uncontrolled with medications.  °You have change in bowel or bladder function, or other concerns. ° ° ° °

## 2022-11-17 NOTE — ED Triage Notes (Signed)
Pt reports with chronic pain to her right neck, right arm, and right shoulder. Pt had an MRI Sunday and states that her doctor has not returned her call.

## 2022-11-17 NOTE — Telephone Encounter (Signed)
Patient called again concerning the pain she is having in her neck and right shoulder.  Would like a call back.  CB# [phone].  Please advise.

## 2022-11-18 NOTE — ED Provider Notes (Signed)
[place] EMERGENCY DEPARTMENT AT [facility] Provider Note   CSN: [id number] Arrival date & time: 11/17/22  2215     History  Chief Complaint  Patient presents with   Pain    Rhonda Higgins is a 52 y.o. female.  The history is provided by the patient.   Patient presents with worsening neck and arm pain.  This has been ongoing for 3 years.  It has been getting worse recently.  Recently seen by her orthopedist and had a recent MRI of her C-spine but has not received the results.  No acute worsening tonight, just having difficulty managing the pain at home.  She reports most the pain is in her right shoulder, right bicep and numbness into her fingers.  No new weakness in the arms or legs.  No new incontinence.  No previous cervical spine surgery.  She reports distant history of a injection in her neck   No chest pain or shortness of breath is reported Home Medications Prior to Admission medications   Medication Sig Start Date End Date Taking? Authorizing Provider  methocarbamol (ROBAXIN) 500 MG tablet Take 1 tablet (500 mg total) by mouth every 8 (eight) hours as needed for muscle spasms. 11/17/22  Yes Zadie Rhine, MD  ACCU-CHEK GUIDE test strip  03/23/20   [provider]  Accu-Chek Softclix Lancets lancets daily. 07/21/20   [provider]  acetaminophen (TYLENOL) 500 MG tablet Take 1,000 mg by mouth every 6 (six) hours as needed for mild pain.    [provider]  atorvastatin (LIPITOR) 40 MG tablet Take 40 mg by mouth daily. 08/22/20   [provider]  buPROPion (WELLBUTRIN XL) 300 MG 24 hr tablet 300 mg daily.    [provider]  Cetirizine HCl (ZYRTEC ALLERGY) 10 MG CAPS Take 10 mg by mouth daily as needed (allergy).    [provider]  gabapentin (NEURONTIN) 400 MG capsule Take 400-1,200 mg by mouth See admin instructions. Takes 400 mg in the morning 400 mg in the afternoon and 1200 mg at night     [provider]  glimepiride (AMARYL) 4 MG tablet Take 2 mg by mouth daily with breakfast.    [provider]  lamoTRIgine (LAMICTAL) 200 MG tablet Take 200 mg by mouth daily.    [provider]  lisinopril (PRINIVIL,ZESTRIL) 5 MG tablet Take 5 mg by mouth daily.    [provider]  meloxicam (MOBIC) 15 MG tablet TAKE 1 TABLET BY MOUTH EVERY DAY 11/15/22   Magnant, Charles L, PA-C  metFORMIN (GLUCOPHAGE-XR) 500 MG 24 hr tablet Take 500 mg by mouth 2 (two) times daily. 10/13/21   [provider]  NOVOLOG MIX 70/30 FLEXPEN (70-30) 100 UNIT/ML FlexPen Inject into the skin. 07/26/22   [provider]  ondansetron (ZOFRAN) 4 MG tablet Take 4 mg by mouth 3 (three) times daily as needed for nausea or vomiting. 12/19/21   [provider]  pantoprazole (PROTONIX) 40 MG tablet Take 40 mg by mouth 2 (two) times daily.     [provider]  Peppermint Oil (IBGARD) 90 MG CPCR Take 2 tablets by mouth daily as needed (IBS). 12/08/19   [provider]  promethazine (PHENERGAN) 12.5 MG tablet Take 12.5 mg by mouth 3 (three) times daily as needed for nausea/vomiting. 12/08/21   [provider]  risperiDONE (RISPERDAL) 2 MG tablet Take 2 mg by mouth at bedtime.  08/22/16   [provider]  sucralfate (CARAFATE) 1 g tablet Take 1 g by mouth daily. 10/28/20   [provider]  SYNTHROID 175 MCG tablet Take 175 mcg by mouth daily. 07/27/20   [provider]  traZODone (DESYREL) 50 MG tablet Take 1 tablet (50 mg total) by mouth at bedtime. 04/18/21   Leslye Peer, MD  Vitamin D, Ergocalciferol, (DRISDOL) 1.25 MG (50000 UNIT) CAPS capsule Take 50,000 Units by mouth once a week. Sunday 02/15/22   [provider]      Allergies    Hydrocodone, Tape, Wound dressing adhesive, Other, Pholcodine, and Codeine    Review of Systems   Review of Systems  Constitutional:  Negative for fever.  Musculoskeletal:   Positive for arthralgias.  Neurological:  Positive for numbness.    Physical Exam Updated Vital Signs BP 127/70   Pulse 82   Temp 98.3 F (36.8 C) (Oral)   Resp 14   Ht 1.6 m (5\' 3")   Wt 103.4 kg   SpO2 99%   BMI 40.39 kg/m  Physical Exam CONSTITUTIONAL: Well developed/well nourished HEAD: Normocephalic/atraumatic EYES: EOMI/PERRL ENMT: Mucous membranes moist NECK: supple no meningeal signs SPINE/BACK: No cervical spine tenderness NEURO: Pt is awake/alert/appropriate, moves all extremitiesx4.  No facial droop.   Equal power (5/5) with hand grip, wrist flex/extension, elbow flex/extension, and equal power with shoulder abduction/adduction.  She reports numbness to the right hand and fingers Equal biceps/brachioradialis reflex in bilateral UE EXTREMITIES: pulses normal/equal, full ROM Tenderness to palpation of the right trapezius and right bicep.  There is no edema, no erythema to the upper extremities.  Distal pulses equal and intact SKIN: warm, color normal PSYCH: no abnormalities of mood noted, alert and oriented to situation  ED Results / Procedures / Treatments   Labs (all labs ordered are listed, but only abnormal results are displayed) Labs Reviewed - No data to display  EKG None  Radiology No results found.  Procedures Procedures    Medications Ordered in ED Medications - No data to display  ED Course/ Medical Decision Making/ A&P                             Medical Decision Making Risk Prescription drug management.   Patient presents with chronic neck pain.  No recent changes.  She had a recent MRI of her C-spine, but unfortunately results are not complete.  Strong suspicion this represents cervical radiculopathy.  No red flags to suggest need for emergent MRI or operative management  she is already scheduled for follow-up with her orthopedist.  Patient has multiple drug allergies, reports that the only opiate that works is Dilaudid.  Unfortunately  we will be unable to prescribe any opiates, but did offer muscle relaxant.  Warned patient of possible drowsiness while taking this medicine.        Final Clinical Impression(s) / ED Diagnoses Final diagnoses:  Cervical radiculopathy    Rx / DC Orders ED Discharge Orders          Ordered    methocarbamol (ROBAXIN) 500 MG tablet  Every 8 hours PRN        05 /31/24 2334              Zadie Rhine, MD 11/18/22 0031

## 2022-11-20 NOTE — Telephone Encounter (Signed)
Ive been waiting on the mri to be read.  Any chance you can call [facility]

## 2022-11-20 NOTE — Telephone Encounter (Signed)
Called they are working on it

## 2022-11-22 ENCOUNTER — Ambulatory Visit (INDEPENDENT_AMBULATORY_CARE_PROVIDER_SITE_OTHER): Payer: Medicaid Other | Admitting: Orthopedic Surgery

## 2022-11-22 ENCOUNTER — Encounter: Payer: Self-pay | Admitting: Orthopedic Surgery

## 2022-11-22 DIAGNOSIS — M5412 Radiculopathy, cervical region: Secondary | ICD-10-CM | POA: Diagnosis not present

## 2022-11-22 NOTE — Progress Notes (Unsigned)
Office Visit Note   Patient: Rhonda Higgins Applegarth           Date of Birth: 1970-06-21           MRN: [id number] Visit Date: 11/22/2022 Requested by: Knox Royalty, MD [address] [place],  [place] [address] PCP: Knox Royalty, MD  Subjective: Chief Complaint  Patient presents with   Neck - Follow-up    HPI: Rhonda Higgins Applegarth is a 52 y.o. female who presents to the office reporting continued debilitating right arm pain.  No left-sided symptoms.  Patient cannot sleep.  The pain wakes her up from sleep at night.  She had fairly significant chest pain with a neck injection in the remote past.  MRI scan of the cervical spine is reviewed with the patient that it does show at C6-7 severe bilateral foraminal stenosis and moderate canal stenosis.  At C4-5 there is moderate left foraminal stenosis and mild canal stenosis.  At C5-6 there is mild bilateral foraminal stenosis and mild canal stenosis.  Overall the appearance of the scan looks a little bit worse than that interpretation..                ROS: All systems reviewed are negative as they relate to the chief complaint within the history of present illness.  Patient denies fevers or chills.  Assessment & Plan: Visit Diagnoses:  1. Cervical radiculopathy     Plan: Impression is neck pain with right-sided radiculopathy.  MRI appearance and findings are consistent with that diagnosis.  Getting to the point now where it is affecting her sleep as well as all ADLs.  Based on her prior experience with a cervical ESI I think her best bet would be surgical evaluation with Dr. Christell Constant.  Patient is requesting surgical evaluation for potential of relief of her symptoms.  Referral made and she will follow-up with [place] as needed  Follow-Up Instructions: No follow-ups on file.   Orders:  Orders Placed This Encounter  Procedures   Ambulatory referral to Orthopedic Surgery   No orders of the defined types were placed in this encounter.      Procedures: No procedures performed   Clinical Data: No additional findings.  Objective: Vital Signs: There were no vitals taken for this visit.  Physical Exam:  Constitutional: Patient appears well-developed HEENT:  Head: Normocephalic Eyes:EOM are normal Neck: Normal range of motion Cardiovascular: Normal rate Pulmonary/chest: Effort normal Neurologic: Patient is alert Skin: Skin is warm Psychiatric: Patient has normal mood and affect  Ortho Exam: Ortho exam demonstrates painful range of motion of the cervical spine particularly with rotation to the right.  Does have pretty reasonable grip EPL FPL interosseous resection extension biceps triceps and deltoid strength.  No coarse grinding or crepitus with internal ex rotation of either shoulder.  Reflexes generally symmetric 0 1+ out of 4 biceps and triceps.  Mild paresthesias in the C6 distribution on the right versus left.  Specialty Comments:  No specialty comments available.  Imaging: No results found.   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   Thrombocytopenia (HCC) 09/09/2021   Iron deficiency anemia 06/24/2021   Status post bronchoscopy 04/20/2021   Pulmonary nodule 1 cm or greater in diameter 03/17/2021   Elevated levels of transaminase & lactic acid dehydrogenase 08/03/2020   Epigastric pain 08/03/2020   Liver nodule 08/03/2020  Obstruction of bile duct 08/03/2020   Cirrhosis of liver (HCC) 07/29/2020   Gastroparesis 07/29/2020   Irritable bowel syndrome 07/29/2020   Steatosis of liver 07/29/2020   MDD (major depressive disorder), recurrent episode, severe (HCC) 04/01/2020   Borderline personality disorder (HCC) 04/01/2020   Intermittent explosive disorder in adult 04/01/2020   Cholelithiasis with chronic cholecystitis 09/06/2017   Multinodular goiter (nontoxic)  11/17/2015   Diabetes mellitus without complication (HCC) 07/19/2014   GERD (gastroesophageal reflux disease) 07/19/2014   HLD (hyperlipidemia) 02/09/2010   Obstructive sleep apnea 02/09/2010   Allergic rhinitis 02/09/2010   History of uterine cancer 02/09/2010   Past Medical History:  Diagnosis Date   Adenocarcinoma (HCC)    endometrial, FIGO GRADE 1   Allergic rhinitis    Atypical chest pain    History of   Depression    Elevated liver enzymes    GERD (gastroesophageal reflux disease)    Hematuria    History of endometrial cancer 08-02-2009   oncologist-  dr brewster/ Andrey Farmer and dr kinard/  no recurrence   endometrial adenocarinoma Stage 1B, Grade 1, FIGO--  s/p  TAH w/ BSO and pelvic lymph node dissection's and radiation therapy   History of kidney stones    History of radiation therapy    2011  pelvic intracavity brachytherapy treatment's for endometrial carcinoma   History of thyroid nodule    multinodular goiter s/p  total thyroidectomy 11-19-2015  per pathology -  adenomatoid nodules   Hyperlipidemia    Hypothyroidism, postsurgical    Insulin dependent diabetes mellitus    Type 2   Left ureteral stone    Obesity    OSA (obstructive sleep apnea)    severe OSA  per study 03-08-2010--  noncomplant cpap (no more sleep apnes since lost weight 2019)   Overactive bladder    Personality disorder (HCC)    Polyphagia(783.6)    PONV (postoperative nausea and vomiting)    after ear surgery only one time   Right lower quadrant pain    Urgency of urination    UTI (urinary tract infection)     Family History  Problem Relation Age of Onset   Heart disease Sister    Heart attack Brother    Heart disease Brother    Heart disease Sister    Diabetes Sister    Breast cancer Sister    Asthma Mother    Heart disease Mother    Diabetes Mother    Emphysema Mother    Hypertension Mother    Stroke Mother    Prostate cancer Father     Past Surgical History:  Procedure Laterality  Date   BIOPSY  04/25/2022   Procedure: BIOPSY;  Surgeon: Kerin Salen, MD;  Location: [facility];  Service: Gastroenterology;;   BRONCHIAL BIOPSY  04/18/2021   Procedure: BRONCHIAL BIOPSIES;  Surgeon: Leslye Peer, MD;  Location: [facility] ENDOSCOPY;  Service: Pulmonary;;   BRONCHIAL BRUSHINGS  04/18/2021   Procedure: BRONCHIAL BRUSHINGS;  Surgeon: Leslye Peer, MD;  Location: [facility] ENDOSCOPY;  Service: Pulmonary;;   BRONCHIAL NEEDLE ASPIRATION BIOPSY  04/18/2021   Procedure: BRONCHIAL NEEDLE ASPIRATION BIOPSIES;  Surgeon: Leslye Peer, MD;  Location: [facility] ENDOSCOPY;  Service: Pulmonary;;   CARDIOVASCULAR STRESS TEST  06/09/2008   normal nuclear study w/ no ischemia/  normal LV function and wall motion , ef 83%   CHOLECYSTECTOMY N/A 09/06/2017   Procedure: LAPAROSCOPIC CHOLECYSTECTOMY WITH INTRAOPERATIVE CHOLANGIOGRAM;  Surgeon: Darnell Level, MD;  Location: [facility]S;  Service: General;  Laterality: N/A;   COLONOSCOPY WITH PROPOFOL N/A 04/25/2022   Procedure: COLONOSCOPY WITH PROPOFOL;  Surgeon: Kerin Salen, MD;  Location: [facility] ENDOSCOPY;  Service: Gastroenterology;  Laterality: N/A;   CYSTOSCOPY/RETROGRADE/URETEROSCOPY/STONE EXTRACTION WITH BASKET Left 03/08/2016   Procedure: CYSTOSCOPY/RETROGRADE/URETEROSCOPY/STONE EXTRACTION WITH BASKET, STENT PLACEMENT;  Surgeon: Barron Alvine, MD;  Location: [facility];  Service: Urology;  Laterality: Left;   ENDOMETRIAL BIOPSY     ERCP N/A 09/07/2017   Procedure: ENDOSCOPIC RETROGRADE CHOLANGIOPANCREATOGRAPHY (ERCP);  Surgeon: Kerin Salen, MD;  Location: [facility];  Service: Gastroenterology;  Laterality: N/A;   ESOPHAGOGASTRODUODENOSCOPY N/A 04/25/2022   Procedure: ESOPHAGOGASTRODUODENOSCOPY (EGD);  Surgeon: Kerin Salen, MD;  Location: [facility];  Service: Gastroenterology;  Laterality: N/A;   ESOPHAGOGASTRODUODENOSCOPY (EGD) WITH PROPOFOL N/A 03/16/2020   Procedure: ESOPHAGOGASTRODUODENOSCOPY (EGD) WITH PROPOFOL;  Surgeon: Kerin Salen, MD;   Location: [facility] ENDOSCOPY;  Service: Gastroenterology;  Laterality: N/A;  unable to locate ampulla, aborted ERCP, changed to EGD   HEMOSTASIS CLIP PLACEMENT  04/25/2022   Procedure: HEMOSTASIS CLIP PLACEMENT;  Surgeon: Kerin Salen, MD;  Location: [facility]COPY;  Service: Gastroenterology;;   HEMOSTASIS CONTROL  04/25/2022   Procedure: HEMOSTASIS CONTROL;  Surgeon: Kerin Salen, MD;  Location: [facility] END[facility];  Service: Gastroenterology;;   HOLMIUM LASER APPLICATION Left 03/08/2016   Procedure: HOLMIUM LASER APPLICATION;  Surgeon: Barron Alvine, MD;  Location: [facility];  Service: Urology;  Laterality: Left;   MOUTH SURGERY     MYRINGECOTMY W/ REMOVAL MIDDLE EAR CHOLESTEATOMA TYPE 1 FASICA TYMPANOPLASTY  09/13/2000   POLYPECTOMY  04/25/2022   Procedure: POLYPECTOMY;  Surgeon: Kerin Salen, MD;  Location: [facility] ENDOSCOPY;  Service: Gastroenterology;;   ROBOTIC ASSISTED TOTAL HYSTERECTOMY WITH BILATERAL SALPINGO OOPHERECTOMY  08-02-2009   at [facility]  dr Andrey Farmer   w/  Bilateral pelvic and para aortic lymph node dissection's   THYROIDECTOMY N/A 11/19/2015   Procedure: TOTAL THYROIDECTOMY;  Surgeon: Darnell Level, MD;  Location: [facility];  Service: General;  Laterality: N/A;   TONSILLECTOMY  age 50   TRANSTHORACIC ECHOCARDIOGRAM  07/19/2014   ef 55-60%/  trivial TR   TYMPANOPLASTY Right 1993   VIDEO BRONCHOSCOPY WITH ENDOBRONCHIAL NAVIGATION Left 04/18/2021   Procedure: ROBOTIC ASSISTED BRONCHOSCOPY WITH ENDOBRONCHIAL NAVIGATION;  Surgeon: Leslye Peer, MD;  Location: [facility] ENDOSCOPY;  Service: Pulmonary;  Laterality: Left;   VIDEO BRONCHOSCOPY WITH RADIAL ENDOBRONCHIAL ULTRASOUND  04/18/2021   Procedure: RADIAL ENDOBRONCHIAL ULTRASOUND;  Surgeon: Leslye Peer, MD;  Location: [facility] ENDOSCOPY;  Service: Pulmonary;;   Social History   Occupational History   Occupation: n/a  Tobacco Use   Smoking status: Never   Smokeless tobacco: Never  Vaping Use   Vaping Use: Never used  Substance and Sexual Activity    Alcohol use: No   Drug use: No   Sexual activity: Yes    Birth control/protection: None

## 2022-12-04 ENCOUNTER — Ambulatory Visit (INDEPENDENT_AMBULATORY_CARE_PROVIDER_SITE_OTHER): Payer: Medicaid Other | Admitting: Orthopedic Surgery

## 2022-12-04 ENCOUNTER — Other Ambulatory Visit (INDEPENDENT_AMBULATORY_CARE_PROVIDER_SITE_OTHER): Payer: Medicaid Other

## 2022-12-04 VITALS — BP 128/83 | Ht 69.0 in | Wt 228.0 lb

## 2022-12-04 DIAGNOSIS — M542 Cervicalgia: Secondary | ICD-10-CM

## 2022-12-04 DIAGNOSIS — M5412 Radiculopathy, cervical region: Secondary | ICD-10-CM

## 2022-12-04 NOTE — Progress Notes (Signed)
Orthopedic Spine Surgery Office Note  Assessment: Patient is a 52 y.o. female with neck pain that radiates into the right upper extremity, consistent with cervical radiculopathy   Plan: -Explained that initially conservative treatment is tried as a significant number of patients may experience relief with these treatment modalities. Discussed that the conservative treatments include:  -activity modification  -physical therapy  -over the counter pain medications  -medrol dosepak  -cervical steroid injections -Patient has tried PT, meloxicam, Tylenol, gabapentin, cervical ESI -Discussed ACDF as a treatment option for her.  Patient wanted to proceed -Referral provided for ENT evaluation of RLN given history of thyroidectomy -Told patient she would need an A1c of 7.5 or less prior to elective spine surgery, will check at preop appointment -Patient will next be seen at the date of surgery   The patient has cervical radiculopathy, which was initially treated with non-operative measures. The patient's symptoms failed to improve with conservative treatments, so operative management was discussed in the form of 5/6 and C6/7 anterior cervical discectomy and fusion. The risks, including but not limited to pseudarthrosis, dysphagia, hematoma, airway compromise, recurrent laryngeal nerve injury, esophageal perforation, durotomy, spinal cord injury, nerve root injury, persistent pain, adjacent segment disease, infection, bleeding, hardware failure, vascular injury, heart attack, death, stroke, fracture, and need for additional procedures were discussed with the patient. The benefit of surgery would be improvement in the patient's radiating arm pain. Explained that the patient may not get full relief of her pain with this surgery, especially any neck pain. The alternatives to surgical management would be continued monitoring, physical therapy, over-the-counter pain medications, injections, traction, and activity  modification. All the patient's questions were answered to her satisfaction. After this discussion, the patient expressed understanding and elected to proceed with surgical intervention.      ___________________________________________________________________________   History:  Patient is a 52 y.o. female who presents today for cervical spine.  Patient has had 1 year of neck pain that radiates into the right upper extremity.  She feels it along the lateral aspect of the into the dorsal forearm and into the hand.  She feels it in multiple fingers in the hand.  She does not have any symptoms on the left side.  There was no trauma or injury that preceded the onset of pain.  She feels pain on a daily basis.  Pain is unrelated to activity.  She even feels the pain at rest.  Pain is getting into the point that it interferes with her ability to do even household chores.  She has not gotten any lasting relief with the conservative treatments she has tried.   Weakness: Denies Difficulty with fine motor skills (e.g., buttoning shirts, handwriting): Denies Symptoms of imbalance: Denies Paresthesias and numbness: Yes, gets numbness and tingling in the same distribution as the pain on the right side.  No other numbness or paresthesias Bowel or bladder incontinence: Denies Saddle anesthesia: Denies  Treatments tried: PT, meloxicam, Tylenol, gabapentin, cervical ESI  Review of systems: Denies fevers and chills, night sweats, unexplained weight loss. Has had pain that wakes her at night, had history of uterine cancer  Past medical history: HLD History of uterine cancer GERD IBS Diabetes (last A!C was 8.2) OSA Depression Postsurgical hypothyroidism Overactive bladder  Allergies: hydrocodone, adhesive tape, pholcodine, codeine  Past surgical history:  Cholecystectomy Cystoscopy with stone extraction Thyroidectomy Hysterectomy Myringectomy with removal of cholesteatoma Tympanoplasty  Social  history: Denies use of nicotine product (smoking, vaping, patches, smokeless) Alcohol use: denies Denies  recreational drug use  Physical Exam:  BMI of 33.7  General: no acute distress, appears stated age Neurologic: alert, answering questions appropriately, following commands Respiratory: unlabored breathing on room air, symmetric chest rise Psychiatric: appropriate affect, normal cadence to speech   MSK (spine):  -Strength exam      Left  Right Grip strength                5/5  5/5 Interosseus   5/5   5/5 Wrist extension  5/5  5/5 Wrist flexion   5/5  5/5 Elbow flexion   5/5  5/5 Deltoid    5/5  5/5   -Sensory exam   Sensation intact to light touch in C5-T1 nerve distributions of bilateral upper extremities  -Brachioradialis DTR: 2/4 on the left, 2/4 on the right -Biceps DTR: 2/4 on the left, 2/4 on the right  -Spurling: positive on the right, negative on the left -Hoffman sign: negative bilaterally -Clonus: no beats bilaterally -Interosseous wasting: none seen -Grip and release test: negative -Romberg: negative -Gait: normal  Left shoulder exam: no pain through range of motion, negative jove, no weakness with external rotation with arm at side, negative belly press Right shoulder exam: no pain through range of motion  Tinel's at wrist: negative bilaterally Phalen's at wrist: negative bilaterally Durkan's: negative bilaterally  Tinel's at elbow: negative bilaterally  Imaging: XR of the cervical spine from 09/04/2022 and 12/04/2022 was independently reviewed and interpreted, showing disc height loss and anterior osteophyte formation at C5/6 and C6/7. No fracture or dislocation. No evidence of instability on flexion/extension views.   MRI of the cervical spine from 11/12/2022 was independently reviewed and interpreted, showing disc bulges at C5/6 and C6/7. DDD at C5/6 and C6/7. Bilateral foraminal stenosis at C5/6. Central and bilateral foraminal stenosis at C6/7. No  T2 cord signal change.    Patient name: Rhonda Higgins Patient MRN: [id number] Date of visit: 12/04/22

## 2022-12-15 ENCOUNTER — Other Ambulatory Visit: Payer: Self-pay | Admitting: Surgical

## 2022-12-15 ENCOUNTER — Telehealth: Payer: Self-pay | Admitting: Orthopedic Surgery

## 2022-12-15 NOTE — Telephone Encounter (Signed)
Pt called stating she will be getting her A1-C checked the 9th of July. Pt phone number is [phone].

## 2022-12-15 NOTE — Telephone Encounter (Signed)
Ok to rf x 1 thx

## 2022-12-15 NOTE — Telephone Encounter (Signed)
noted 

## 2022-12-19 ENCOUNTER — Encounter: Payer: Self-pay | Admitting: Podiatry

## 2022-12-19 ENCOUNTER — Ambulatory Visit (INDEPENDENT_AMBULATORY_CARE_PROVIDER_SITE_OTHER): Payer: Medicaid Other | Admitting: Podiatry

## 2022-12-19 DIAGNOSIS — E1142 Type 2 diabetes mellitus with diabetic polyneuropathy: Secondary | ICD-10-CM | POA: Diagnosis not present

## 2022-12-19 DIAGNOSIS — B351 Tinea unguium: Secondary | ICD-10-CM

## 2022-12-19 DIAGNOSIS — M79674 Pain in right toe(s): Secondary | ICD-10-CM | POA: Diagnosis not present

## 2022-12-19 DIAGNOSIS — M79675 Pain in left toe(s): Secondary | ICD-10-CM

## 2022-12-19 NOTE — Progress Notes (Signed)
  Subjective:  Patient ID: Rhonda Higgins, female    DOB: [birth year],  MRN: [id number]  Rhonda Higgins presents to clinic today for: at risk foot care with history of diabetic neuropathy and painful thick toenails that are difficult to trim. Pain interferes with ambulation. Aggravating factors include wearing enclosed shoe gear. Pain is relieved with periodic professional debridement.  Chief Complaint  Patient presents with   Diabetes    DFC BS - 143 LAST NIGHT, HASN'T TAKE IT TODAY A1C - 8.2 LVPCP - 09/2022     PCP is Knox Royalty, MD.  Allergies  Allergen Reactions   Hydrocodone Shortness Of Breath and Itching   Tape Rash    Paper tape is ok   Wound Dressing Adhesive Rash   Other Other (See Comments)    Pholcodine Unknown   Pholcodine Itching   Codeine Itching    Review of Systems: Negative except as noted in the HPI.  Objective: No changes noted in today's physical examination. There were no vitals filed for this visit.  Rhonda Higgins is a pleasant 52 y.o. female in NAD. AAO x 3.  Vascular Examination: Capillary refill time <3 seconds b/l LE. Palpable pedal pulses b/l LE. Digital hair present b/l. No pedal edema b/l. Skin temperature gradient WNL b/l. No varicosities b/l. [name]  Dermatological Examination: Pedal skin with normal turgor, texture and tone b/l. No open wounds. No interdigital macerations b/l. Toenails 1-5 b/l thickened, discolored, dystrophic with subungual debris. There is pain on palpation to dorsal aspect of nailplates. No hyperkeratotic nor porokeratotic lesions present on today's visit.[name]  Neurological Examination: Protective sensation intact with 10 gram monofilament b/l LE. Vibratory sensation intact b/l LE. Pt has subjective symptoms of neuropathy.  Musculoskeletal Examination: Normal muscle strength 5/5 to all lower extremity muscle groups bilaterally. No pain, crepitus or joint limitation noted with ROM b/l LE. No  gross bony pedal deformities b/l. Patient ambulates independently without assistive aids.  Assessment/Plan: 1. Pain due to onychomycosis of toenails of both feet   2. Diabetic peripheral neuropathy associated with type 2 diabetes mellitus (HCC)   Patient was evaluated and treated. All patient's and/or POA's questions/concerns addressed on today's visit. Toenails 1-5 debrided in length and girth without incident. Continue soft, supportive shoe gear daily. Report any pedal injuries to medical professional. Call office if there are any questions/concerns. -Continue foot and shoe inspections daily. Monitor blood glucose per PCP/Endocrinologist's recommendations. -Patient/POA to call should there be question/concern in the interim.   Return in about 3 months (around 03/21/2023).  Freddie Breech, DPM

## 2023-01-14 ENCOUNTER — Other Ambulatory Visit: Payer: Self-pay | Admitting: Orthopedic Surgery

## 2023-01-16 ENCOUNTER — Other Ambulatory Visit: Payer: Self-pay | Admitting: Gastroenterology

## 2023-01-16 DIAGNOSIS — K7689 Other specified diseases of liver: Secondary | ICD-10-CM

## 2023-01-16 DIAGNOSIS — K7469 Other cirrhosis of liver: Secondary | ICD-10-CM

## 2023-01-16 NOTE — Pre-Procedure Instructions (Signed)
Surgical Instructions   Your procedure is scheduled on Friday, August 9th. Report to [facility] Main Entrance "A" at 10:15 A.M., then check in with the Admitting office. Any questions or running late day of surgery: call [phone]  Questions prior to your surgery date: call [phone], Monday-Friday, 8am-4pm. If you experience any cold or flu symptoms such as cough, fever, chills, shortness of breath, etc. between now and your scheduled surgery, please notify us at the above number.     Remember:  Do not eat after midnight the night before your surgery  You may drink clear liquids until 09:15 AM the morning of your surgery.   Clear liquids allowed are: Water, Non-Citrus Juices (without pulp), Carbonated Beverages, Clear Tea, Black Coffee Only (NO MILK, CREAM OR POWDERED CREAMER of any kind), and Gatorade.  Patient Instructions  The night before surgery:  No food after midnight. ONLY clear liquids after midnight   The day of surgery (if you have diabetes): Drink ONE (1) 12 oz G2 given to you in your pre admission testing appointment by 09:15 AM the morning of surgery. Drink in one sitting. Do not sip.  This drink was given to you during your hospital  pre-op appointment visit.  Nothing else to drink after completing the  12 oz bottle of G2.         If you have questions, please contact your surgeon's office.     Take these medicines the morning of surgery with A SIP OF WATER  atorvastatin (LIPITOR)  buPROPion (WELLBUTRIN XL)  gabapentin (NEURONTIN)  pantoprazole (PROTONIX)  SYNTHROID   May take these medicines IF NEEDED: cetirizine (ZYRTEC)  dicyclomine (BENTYL)  ondansetron (ZOFRAN-ODT)     One week prior to surgery, STOP taking any Aspirin (unless otherwise instructed by your surgeon) Aleve, Naproxen, Ibuprofen, meloxicam (MOBIC), Motrin, Advil, Goody's, BC's, all herbal medications, fish oil, and non-prescription vitamins.  WHAT DO I DO ABOUT MY DIABETES  MEDICATION?   Do not take metFORMIN (GLUCOPHAGE-XR) the morning of surgery.  THE NIGHT BEFORE SURGERY, take 10 units (50%) of insulin glargine (LANTUS).     STOP TAKING OZEMPIC 7 days prior to surgery. Last dose 7/28.   HOW TO MANAGE YOUR DIABETES BEFORE AND AFTER SURGERY  Why is it important to control my blood sugar before and after surgery? Improving blood sugar levels before and after surgery helps healing and can limit problems. A way of improving blood sugar control is eating a healthy diet by:  Eating less sugar and carbohydrates  Increasing activity/exercise  Talking with your doctor about reaching your blood sugar goals High blood sugars (greater than 180 mg/dL) can raise your risk of infections and slow your recovery, so you will need to focus on controlling your diabetes during the weeks before surgery. Make sure that the doctor who takes care of your diabetes knows about your planned surgery including the date and location.  How do I manage my blood sugar before surgery? Check your blood sugar at least 4 times a day, starting 2 days before surgery, to make sure that the level is not too high or low.  Check your blood sugar the morning of your surgery when you wake up and every 2 hours until you get to the Short Stay unit.  If your blood sugar is less than 70 mg/dL, you will need to treat for low blood sugar: Do not take insulin. Treat a low blood sugar (less than 70 mg/dL) with  cup of clear juice (cranberry  or apple), 4 glucose tablets, OR glucose gel. Recheck blood sugar in 15 minutes after treatment (to make sure it is greater than 70 mg/dL). If your blood sugar is not greater than 70 mg/dL on recheck, call [phone] for further instructions. Report your blood sugar to the short stay nurse when you get to Short Stay.  If you are admitted to the hospital after surgery: Your blood sugar will be checked by the staff and you will probably be given insulin after surgery  (instead of oral diabetes medicines) to make sure you have good blood sugar levels. The goal for blood sugar control after surgery is 80-180 mg/dL.                     Do NOT Smoke (Tobacco/Vaping) for 24 hours prior to your procedure.  If you use a CPAP at night, you may bring your mask/headgear for your overnight stay.   You will be asked to remove any contacts, glasses, piercing's, hearing aid's, dentures/partials prior to surgery. Please bring cases for these items if needed.    Patients discharged the day of surgery will not be allowed to drive home, and someone needs to stay with them for 24 hours.  SURGICAL WAITING ROOM VISITATION Patients may have no more than 2 support people in the waiting area - these visitors may rotate.   Pre-op nurse will coordinate an appropriate time for 1 ADULT support person, who may not rotate, to accompany patient in pre-op.  Children under the age of 18 must have an adult with them who is not the patient and must remain in the main waiting area with an adult.  If the patient needs to stay at the hospital during part of their recovery, the visitor guidelines for inpatient rooms apply.  Please refer to the [facility] website for the visitor guidelines for any additional information.   If you received a COVID test during your pre-op visit  it is requested that you wear a mask when out in public, stay away from anyone that may not be feeling well and notify your surgeon if you develop symptoms. If you have been in contact with anyone that has tested positive in the last 10 days please notify you surgeon.      Pre-operative 5 CHG Bathing Instructions   You can play a key role in reducing the risk of infection after surgery. Your skin needs to be as free of germs as possible. You can reduce the number of germs on your skin by washing with CHG (chlorhexidine gluconate) soap before surgery. CHG is an antiseptic soap that kills germs and continues to kill  germs even after washing.   DO NOT use if you have an allergy to chlorhexidine/CHG or antibacterial soaps. If your skin becomes reddened or irritated, stop using the CHG and notify one of our RNs at [phone].   Please shower with the CHG soap starting 4 days before surgery using the following schedule:     Please keep in mind the following:  DO NOT shave, including legs and underarms, starting the day of your first shower.   You may shave your face at any point before/day of surgery.  Place clean sheets on your bed the day you start using CHG soap. Use a clean washcloth (not used since being washed) for each shower. DO NOT sleep with pets once you start using the CHG.   CHG Shower Instructions:  If you choose to wash your hair  and private area, wash first with your normal shampoo/soap.  After you use shampoo/soap, rinse your hair and body thoroughly to remove shampoo/soap residue.  Turn the water OFF and apply about 3 tablespoons (45 ml) of CHG soap to a CLEAN washcloth.  Apply CHG soap ONLY FROM YOUR NECK DOWN TO YOUR TOES (washing for 3-5 minutes)  DO NOT use CHG soap on face, private areas, open wounds, or sores.  Pay special attention to the area where your surgery is being performed.  If you are having back surgery, having someone wash your back for you may be helpful. Wait 2 minutes after CHG soap is applied, then you may rinse off the CHG soap.  Pat dry with a clean towel  Put on clean clothes/pajamas   If you choose to wear lotion, please use ONLY the CHG-compatible lotions on the back of this paper.   Additional instructions for the day of surgery: DO NOT APPLY any lotions, deodorants, cologne, or perfumes.   Do not bring valuables to the hospital. [facility] is not responsible for any belongings/valuables. Do not wear nail polish, gel polish, artificial nails, or any other type of covering on natural nails (fingers and toes) Do not wear jewelry or makeup Put on  clean/comfortable clothes.  Please brush your teeth.  Ask your nurse before applying any prescription medications to the skin.     CHG Compatible Lotions   Aveeno Moisturizing lotion  Cetaphil Moisturizing Cream  Cetaphil Moisturizing Lotion  Clairol Herbal Essence Moisturizing Lotion, Dry Skin  Clairol Herbal Essence Moisturizing Lotion, Extra Dry Skin  Clairol Herbal Essence Moisturizing Lotion, Normal Skin  Curel Age Defying Therapeutic Moisturizing Lotion with Alpha Hydroxy  Curel Extreme Care Body Lotion  Curel Soothing Hands Moisturizing Hand Lotion  Curel Therapeutic Moisturizing Cream, Fragrance-Free  Curel Therapeutic Moisturizing Lotion, Fragrance-Free  Curel Therapeutic Moisturizing Lotion, Original Formula  Eucerin Daily Replenishing Lotion  Eucerin Dry Skin Therapy Plus Alpha Hydroxy Crme  Eucerin Dry Skin Therapy Plus Alpha Hydroxy Lotion  Eucerin Original Crme  Eucerin Original Lotion  Eucerin Plus Crme Eucerin Plus Lotion  Eucerin TriLipid Replenishing Lotion  Keri Anti-Bacterial Hand Lotion  Keri Deep Conditioning Original Lotion Dry Skin Formula Softly Scented  Keri Deep Conditioning Original Lotion, Fragrance Free Sensitive Skin Formula  Keri Lotion Fast Absorbing Fragrance Free Sensitive Skin Formula  Keri Lotion Fast Absorbing Softly Scented Dry Skin Formula  Keri Original Lotion  Keri Skin Renewal Lotion Keri Silky Smooth Lotion  Keri Silky Smooth Sensitive Skin Lotion  Nivea Body Creamy Conditioning Oil  Nivea Body Extra Enriched Lotion  Nivea Body Original Lotion  Nivea Body Sheer Moisturizing Lotion Nivea Crme  Nivea Skin Firming Lotion  NutraDerm 30 Skin Lotion  NutraDerm Skin Lotion  NutraDerm Therapeutic Skin Cream  NutraDerm Therapeutic Skin Lotion  ProShield Protective Hand Cream  Provon moisturizing lotion  Please read over the following fact sheets that you were given.

## 2023-01-17 ENCOUNTER — Encounter (HOSPITAL_COMMUNITY): Payer: Self-pay

## 2023-01-17 ENCOUNTER — Other Ambulatory Visit: Payer: Self-pay

## 2023-01-17 ENCOUNTER — Encounter (HOSPITAL_COMMUNITY)
Admission: RE | Admit: 2023-01-17 | Discharge: 2023-01-17 | Disposition: A | Discharge status: Home / Self Care | Payer: Medicaid Other | Source: Ambulatory Visit | Attending: Orthopedic Surgery | Admitting: Orthopedic Surgery

## 2023-01-17 VITALS — BP 143/66 | HR 66 | Temp 98.6°F | Resp 17 | Ht 63.0 in | Wt 215.8 lb

## 2023-01-17 DIAGNOSIS — Z01818 Encounter for other preprocedural examination: Secondary | ICD-10-CM | POA: Diagnosis present

## 2023-01-17 DIAGNOSIS — M5412 Radiculopathy, cervical region: Secondary | ICD-10-CM

## 2023-01-17 DIAGNOSIS — E119 Type 2 diabetes mellitus without complications: Secondary | ICD-10-CM | POA: Diagnosis not present

## 2023-01-17 DIAGNOSIS — I10 Essential (primary) hypertension: Secondary | ICD-10-CM | POA: Insufficient documentation

## 2023-01-17 DIAGNOSIS — K7469 Other cirrhosis of liver: Secondary | ICD-10-CM | POA: Diagnosis not present

## 2023-01-17 DIAGNOSIS — E785 Hyperlipidemia, unspecified: Secondary | ICD-10-CM | POA: Insufficient documentation

## 2023-01-17 DIAGNOSIS — K219 Gastro-esophageal reflux disease without esophagitis: Secondary | ICD-10-CM | POA: Diagnosis not present

## 2023-01-17 DIAGNOSIS — G4733 Obstructive sleep apnea (adult) (pediatric): Secondary | ICD-10-CM | POA: Diagnosis not present

## 2023-01-17 HISTORY — DX: Unspecified osteoarthritis, unspecified site: M19.90

## 2023-01-17 HISTORY — DX: Umbilical hernia without obstruction or gangrene: K42.9

## 2023-01-17 HISTORY — DX: Unspecified cirrhosis of liver: K74.60

## 2023-01-17 HISTORY — DX: Borderline personality disorder: F60.3

## 2023-01-17 LAB — GLUCOSE, CAPILLARY: Glucose-Capillary: 86 mg/dL (ref 70–99)

## 2023-01-17 LAB — COMPREHENSIVE METABOLIC PANEL
ALT: 36 U/L (ref 0–44)
AST: 42 U/L — ABNORMAL HIGH (ref 15–41)
Albumin: 3.9 g/dL (ref 3.5–5.0)
Alkaline Phosphatase: 63 U/L (ref 38–126)
Anion gap: 13 (ref 5–15)
BUN: 9 mg/dL (ref 6–20)
CO2: 23 mmol/L (ref 22–32)
Calcium: 8.5 mg/dL — ABNORMAL LOW (ref 8.9–10.3)
Chloride: 104 mmol/L (ref 98–111)
Creatinine, Ser: 0.67 mg/dL (ref 0.44–1.00)
GFR, Estimated: >60 mL/min (ref >60)
Glucose, Bld: 88 mg/dL (ref 70–99)
Potassium: 3.7 mmol/L (ref 3.5–5.1)
Sodium: 140 mmol/L (ref 135–145)
Total Bilirubin: 1.3 mg/dL — ABNORMAL HIGH (ref 0.3–1.2)
Total Protein: 6.9 g/dL (ref 6.5–8.1)

## 2023-01-17 LAB — SURGICAL PCR SCREEN
MRSA, PCR: NEGATIVE
Staphylococcus aureus: POSITIVE — AB

## 2023-01-17 NOTE — Progress Notes (Signed)
PCP - Dr. Knox Royalty Cardiologist - Dr. Rinaldo Cloud (pt only sees due to family hx of cardiac disease)  PPM/ICD - denies   Chest x-ray - 04/30/21 EKG - 01/17/23 Stress Test - 06/09/08 ECHO - 07/19/14 Cardiac Cath - denies  Sleep Study - denies (Pt was positive for sleep apnea on test in 2011. Pt lost weight, and had repeat test in 2017. That test was negative for sleep apnea) CPAP - denies  Fasting Blood Sugar - 80-150 Checks Blood Sugar 2-3 times a day  Last dose of GLP1 agonist-  01/14/23 GLP1 instructions: Hold 7 days  ASA/Blood Thinner Instructions: n/a   ERAS Protcol - yes PRE-SURGERY G2- given at PAT  COVID TEST- n/a   Anesthesia review: yes, records requested from Dr. Sharyn Lull  Patient denies shortness of breath, fever, cough and chest pain at PAT appointment   All instructions explained to the patient, with a verbal understanding of the material. Patient agrees to go over the instructions while at home for a better understanding. The opportunity to ask questions was provided.

## 2023-01-17 NOTE — Progress Notes (Signed)
No lavender top received per lab (for PAT labs). Please re-collect CBC on day of surgery

## 2023-01-18 NOTE — Anesthesia Preprocedure Evaluation (Addendum)
Anesthesia Evaluation  Patient identified by MRN, date of birth, ID band Patient awake    Reviewed: Allergy & Precautions, NPO status , Patient's Chart, lab work & pertinent test results  History of Anesthesia Complications (+) PONV and history of anesthetic complications  Airway Mallampati: III  TM Distance: >3 FB    Comment: Previous grade I view with MAC 3, easy mask Dental  (+) Dental Advisory Given Missing several teeth. Denies loose teeth.:   Pulmonary neg shortness of breath, sleep apnea (patient states no longer an issue after weightloss) , neg COPD, neg recent URI   Pulmonary exam normal breath sounds clear to auscultation       Cardiovascular hypertension (lisinopril), Pt. on medications (-) angina (-) Past MI, (-) Cardiac Stents and (-) CABG (-) dysrhythmias  Rhythm:Regular Rate:Normal  HLD   Neuro/Psych neg Seizures PSYCHIATRIC DISORDERS (borderline personality disorder)  Depression     Neuromuscular disease (cervical radiculopathy)    GI/Hepatic ,GERD  Medicated,,(+) Cirrhosis       gastroparesis   Endo/Other  diabetes, Type 2, Insulin Dependent, Oral Hypoglycemic AgentsHypothyroidism    Renal/GU negative Renal ROS Bladder dysfunction      Musculoskeletal  (+) Arthritis ,    Abdominal  (+) + obese  Peds  Hematology  (+) Blood dyscrasia (thrombocytopenia), anemia Lab Results      Component                Value               Date                      WBC                      3.3 (L)             01/26/2023                HGB                      9.7 (L)             01/26/2023                HCT                      33.6 (L)            01/26/2023                MCV                      71.5 (L)            01/26/2023                PLT                      97 (L)              01/26/2023                     Anesthesia Other Findings Last Ozempic: 01/14/2023  Reproductive/Obstetrics H/o endometrial  cancer                             Anesthesia Physical Anesthesia Plan  ASA: 3  Anesthesia Plan: General  Post-op Pain Management: Tylenol PO (pre-op)* and Ketamine IV*   Induction: Intravenous  PONV Risk Score and Plan: 4 or greater and Ondansetron, Dexamethasone, Propofol infusion, TIVA, Midazolam and Treatment may vary due to age or medical condition  Airway Management Planned: Oral ETT and Video Laryngoscope Planned  Additional Equipment: Arterial line  Intra-op Plan:   Post-operative Plan: Extubation in OR  Informed Consent: I have reviewed the patients History and Physical, chart, labs and discussed the procedure including the risks, benefits and alternatives for the proposed anesthesia with the patient or authorized representative who has indicated his/her understanding and acceptance.     Dental advisory given  Plan Discussed with: Anesthesiologist and CRNA  Anesthesia Plan Comments: (Plan for TIVA with propofol and remifentanil given neuromonitoring. Plan for 2 large-bore PIVs and arterial line given length of case.  Risks of general anesthesia discussed including, but not limited to, sore throat, hoarse voice, chipped/damaged teeth, injury to vocal cords, nausea and vomiting, allergic reactions, lung infection, heart attack, stroke, and death. All questions answered.    PAT note by Antionette Poles, PA-C: 52 yo female with pertinent hx including PONV, HLD, HTN, GERD, s/p total thyroidectomy, IDDM2 (A1c 6.8 on 12/26/22), Hx of OSA (retest after weight loss in 2017 stated CPAP not warranted), cirrhosis with resultant thrombocytopenia.  Follows with cardiologist Dr. Sharyn Lull for risk factor modification in the setting of family hx of CVD. Pt has no personal hx of CVD. Last seen 12/19/22 and HTN, IDDM2, and HLD were noted to be well controlled.   Due to hx of total thyroidectomy, Dr. Christell Constant referred pt to ENT for preop eval. Seen by Dr. Marene Lenz 01/04/23.  Per note, "Laryngoscopy performed today demonstrates normal mobility of true vocal folds bilaterally, with mild interarytenoid edema and erythema, likely secondary to reflux. Patient was reassured. We discussed that if she experiences any significant dysphonia following surgery, repeat laryngoscopy would be indicated. 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   Pt on once weekly GLP1a, reports last dose 01/14/23.  Preop CMP reviewed, unremarkable. CBC needs to be recollected DOS.  EKG 01/17/23: Sinus rhythm with marked sinus arrhythmia. Rate 72. Low voltage QRS  CT Abd/Pelvis 09/01/22: IMPRESSION: 1. No acute abnormality identified in the abdomen or pelvis. 2. Cirrhotic hepatic morphology with sequela of portal hypertension including splenomegaly and portosystemic collateral vessels. 3. Similar size of the scattered hypodense hepatic lesions measuring up to 10 mm in the central right lobe of the liver, previously evaluated by MRI abdomen April 01, 2022 and categorized as LI-RADS category 3 lesions warranting follow-up in 3-6 months from that examination. 4. Nonobstructive left renal stones measure up to 9 mm. 5. Moderate volume of formed stool in the colon.  TTE 07/20/2014: - Left ventricle: The cavity size was normal. Systolic function was normal. The estimated ejection fraction was in the range of 55% to 60%. Wall motion was normal; there were no regional wall motion abnormalities. Left ventricular diastolic function parameters were normal.  - Atrial septum: No defect or patent foramen ovale was identified.   Nuclear stress test 06/09/2008: IMPRESSION:  No evidence for ischemia.  Normal wall motion and ejection fraction measures 83%.   )        Anesthesia Quick Evaluation

## 2023-01-18 NOTE — Progress Notes (Addendum)
Anesthesia Chart Review:  52 yo female with pertinent hx including PONV, HLD, HTN, GERD, s/p total thyroidectomy, IDDM2 (A1c 6.8 on 12/26/22), Hx of OSA (retest after weight loss in 2017 stated CPAP not warranted), cirrhosis with resultant thrombocytopenia.  Follows with cardiologist Dr. Sharyn Lull for risk factor modification in the setting of family hx of CVD. Pt has no personal hx of CVD. Last seen 12/19/22 and HTN, IDDM2, and HLD were noted to be well controlled.   Due to hx of total thyroidectomy, Dr. Christell Constant referred pt to ENT for preop eval. Seen by Dr. Marene Lenz 01/04/23. Per note, "Laryngoscopy performed today demonstrates normal mobility of true vocal folds bilaterally, with mild interarytenoid edema and erythema, likely secondary to reflux. Patient was reassured. We discussed that if she experiences any significant dysphonia following surgery, repeat laryngoscopy would be indicated. 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   Pt on once weekly GLP1a, reports last dose 01/14/23.  Preop CMP reviewed, unremarkable. CBC needs to be recollected DOS.  EKG 01/17/23: Sinus rhythm with marked sinus arrhythmia. Rate 72. Low voltage QRS  CT Abd/Pelvis 09/01/22: IMPRESSION: 1. No acute abnormality identified in the abdomen or pelvis. 2. Cirrhotic hepatic morphology with sequela of portal hypertension including splenomegaly and portosystemic collateral vessels. 3. Similar size of the scattered hypodense hepatic lesions measuring up to 10 mm in the central right lobe of the liver, previously evaluated by MRI abdomen April 01, 2022 and categorized as LI-RADS category 3 lesions warranting follow-up in 3-6 months from that examination. 4. Nonobstructive left renal stones measure up to 9 mm. 5. Moderate volume of formed stool in the colon.  TTE 07/20/2014: - Left ventricle: The cavity size was normal. Systolic function was normal. The  estimated ejection fraction was in the range of 55% to 60%. Wall motion was normal; there were no regional wall motion abnormalities. Left ventricular diastolic function parameters were normal.  - Atrial septum: No defect or patent foramen ovale was identified.   Nuclear stress test 06/09/2008: IMPRESSION:  No evidence for ischemia.  Normal wall motion and ejection fraction measures 83%.    Zannie Cove [facility] Short Stay Center/Anesthesiology Phone [phone] 01/18/2023 4:20 PM

## 2023-01-24 ENCOUNTER — Telehealth: Payer: Self-pay | Admitting: Orthopedic Surgery

## 2023-01-24 NOTE — Telephone Encounter (Signed)
Patient called. Says her blood sugar has been in the low 200s. Would like a call. [phone]

## 2023-01-25 NOTE — Telephone Encounter (Signed)
I called and advised that this is fine, but that she should try to stay away from things with sugar in it to keep it as low as possible for tomorrow

## 2023-01-26 ENCOUNTER — Ambulatory Visit (HOSPITAL_COMMUNITY): Payer: Medicaid Other

## 2023-01-26 ENCOUNTER — Ambulatory Visit (HOSPITAL_COMMUNITY): Payer: Medicaid Other | Admitting: Physician Assistant

## 2023-01-26 ENCOUNTER — Ambulatory Visit (HOSPITAL_BASED_OUTPATIENT_CLINIC_OR_DEPARTMENT_OTHER): Payer: Medicaid Other | Admitting: Anesthesiology

## 2023-01-26 ENCOUNTER — Encounter (HOSPITAL_COMMUNITY)
Admission: RE | Disposition: A | Discharge status: Home / Self Care | Payer: Self-pay | Source: Home / Self Care | Attending: Orthopedic Surgery

## 2023-01-26 ENCOUNTER — Other Ambulatory Visit: Payer: Self-pay

## 2023-01-26 ENCOUNTER — Observation Stay (HOSPITAL_COMMUNITY): Admission: RE | Admit: 2023-01-26 | Payer: Medicaid Other | Source: Home / Self Care | Admitting: Orthopedic Surgery

## 2023-01-26 DIAGNOSIS — M5412 Radiculopathy, cervical region: Secondary | ICD-10-CM | POA: Diagnosis not present

## 2023-01-26 DIAGNOSIS — E039 Hypothyroidism, unspecified: Secondary | ICD-10-CM | POA: Insufficient documentation

## 2023-01-26 DIAGNOSIS — M50122 Cervical disc disorder at C5-C6 level with radiculopathy: Secondary | ICD-10-CM | POA: Diagnosis present

## 2023-01-26 DIAGNOSIS — M50123 Cervical disc disorder at C6-C7 level with radiculopathy: Secondary | ICD-10-CM | POA: Insufficient documentation

## 2023-01-26 DIAGNOSIS — Z8541 Personal history of malignant neoplasm of cervix uteri: Secondary | ICD-10-CM | POA: Diagnosis not present

## 2023-01-26 DIAGNOSIS — Z01818 Encounter for other preprocedural examination: Secondary | ICD-10-CM

## 2023-01-26 DIAGNOSIS — E119 Type 2 diabetes mellitus without complications: Secondary | ICD-10-CM

## 2023-01-26 DIAGNOSIS — I10 Essential (primary) hypertension: Secondary | ICD-10-CM | POA: Diagnosis not present

## 2023-01-26 DIAGNOSIS — M4802 Spinal stenosis, cervical region: Secondary | ICD-10-CM | POA: Diagnosis not present

## 2023-01-26 HISTORY — PX: ANTERIOR CERVICAL DECOMP/DISCECTOMY FUSION: SHX1161

## 2023-01-26 LAB — CBC
HCT: 33.6 % — ABNORMAL LOW (ref 36.0–46.0)
Hemoglobin: 9.7 g/dL — ABNORMAL LOW (ref 12.0–15.0)
MCH: 20.6 pg — ABNORMAL LOW (ref 26.0–34.0)
MCHC: 28.9 g/dL — ABNORMAL LOW (ref 30.0–36.0)
MCV: 71.5 fL — ABNORMAL LOW (ref 80.0–100.0)
Platelets: 97 10*3/uL — ABNORMAL LOW (ref 150–400)
RBC: 4.7 MIL/uL (ref 3.87–5.11)
RDW: 16.2 % — ABNORMAL HIGH (ref 11.5–15.5)
WBC: 3.3 10*3/uL — ABNORMAL LOW (ref 4.0–10.5)
nRBC: 0 % (ref 0.0–0.2)

## 2023-01-26 LAB — ABO/RH: ABO/RH(D): A NEG

## 2023-01-26 LAB — TYPE AND SCREEN
ABO/RH(D): A NEG
Antibody Screen: NEGATIVE

## 2023-01-26 LAB — GLUCOSE, CAPILLARY
Glucose-Capillary: 152 mg/dL — ABNORMAL HIGH (ref 70–99)
Glucose-Capillary: 198 mg/dL — ABNORMAL HIGH (ref 70–99)

## 2023-01-26 SURGERY — ANTERIOR CERVICAL DECOMPRESSION/DISCECTOMY FUSION 2 LEVELS
Anesthesia: General

## 2023-01-26 MED ORDER — SENNA 8.6 MG PO TABS
1.0000 | ORAL_TABLET | Freq: Two times a day (BID) | ORAL | Status: DC
  Administered 2023-01-26 – 2023-01-27 (×2): 8.6 mg via ORAL
  Filled 2023-01-26 (×2): qty 1

## 2023-01-26 MED ORDER — ACETAMINOPHEN 500 MG PO TABS
1000.0000 mg | ORAL_TABLET | Freq: Three times a day (TID) | ORAL | Status: DC
  Administered 2023-01-26 – 2023-01-27 (×2): 1000 mg via ORAL
  Filled 2023-01-26 (×2): qty 2

## 2023-01-26 MED ORDER — OXYCODONE HCL 5 MG/5ML PO SOLN: 5.0000 mg | Freq: Once | ORAL | Status: DC | PRN

## 2023-01-26 MED ORDER — SODIUM CHLORIDE 0.9 % IV SOLN
0.2000 ug/kg/min | INTRAVENOUS | Status: DC
  Administered 2023-01-26: .2 ug/kg/min via INTRAVENOUS
  Filled 2023-01-26 (×2): qty 2000

## 2023-01-26 MED ORDER — LIDOCAINE 2% (20 MG/ML) 5 ML SYRINGE
INTRAMUSCULAR | Status: DC | PRN
  Administered 2023-01-26: 80 mg via INTRAVENOUS

## 2023-01-26 MED ORDER — FENTANYL CITRATE (PF) 250 MCG/5ML IJ SOLN
INTRAMUSCULAR | Status: DC | PRN
  Administered 2023-01-26 (×2): 100 ug via INTRAVENOUS
  Administered 2023-01-26: 50 ug via INTRAVENOUS

## 2023-01-26 MED ORDER — HYDROMORPHONE HCL 1 MG/ML IJ SOLN
INTRAMUSCULAR | Status: AC
  Filled 2023-01-26: qty 0.5

## 2023-01-26 MED ORDER — SUCCINYLCHOLINE CHLORIDE 200 MG/10ML IV SOSY
PREFILLED_SYRINGE | INTRAVENOUS | Status: AC
  Filled 2023-01-26: qty 10

## 2023-01-26 MED ORDER — ACETAMINOPHEN 500 MG PO TABS
1000.0000 mg | ORAL_TABLET | Freq: Once | ORAL | Status: AC
  Administered 2023-01-26: 1000 mg via ORAL
  Filled 2023-01-26: qty 2

## 2023-01-26 MED ORDER — PROPOFOL 10 MG/ML IV BOLUS
INTRAVENOUS | Status: DC | PRN
Start: 2023-01-26 — End: 2023-01-26
  Administered 2023-01-26: 100 mg via INTRAVENOUS
  Administered 2023-01-26: 125 ug/kg/min via INTRAVENOUS
  Administered 2023-01-26: 100 mg via INTRAVENOUS

## 2023-01-26 MED ORDER — INSULIN ASPART 100 UNIT/ML IJ SOLN: 0.0000 [IU] | Freq: Every day | INTRAMUSCULAR | Status: DC

## 2023-01-26 MED ORDER — CHLORHEXIDINE GLUCONATE 0.12 % MT SOLN
15.0000 mL | Freq: Once | OROMUCOSAL | Status: AC
  Administered 2023-01-26: 15 mL via OROMUCOSAL
  Filled 2023-01-26: qty 15

## 2023-01-26 MED ORDER — METFORMIN HCL ER 500 MG PO TB24
500.0000 mg | ORAL_TABLET | Freq: Two times a day (BID) | ORAL | Status: DC
  Administered 2023-01-27: 500 mg via ORAL
  Filled 2023-01-26: qty 1

## 2023-01-26 MED ORDER — HYDROMORPHONE HCL 1 MG/ML IJ SOLN
INTRAMUSCULAR | Status: DC | PRN
  Administered 2023-01-26 (×2): .5 mg via INTRAVENOUS

## 2023-01-26 MED ORDER — POVIDONE-IODINE 10 % EX SWAB: 2.0000 | Freq: Once | CUTANEOUS | Status: DC

## 2023-01-26 MED ORDER — GABAPENTIN 400 MG PO CAPS: 400.0000 mg | ORAL_CAPSULE | ORAL | Status: DC

## 2023-01-26 MED ORDER — POLYETHYLENE GLYCOL 3350 17 G PO PACK
17.0000 g | PACK | Freq: Two times a day (BID) | ORAL | Status: DC
  Administered 2023-01-27: 17 g via ORAL
  Filled 2023-01-26 (×2): qty 1

## 2023-01-26 MED ORDER — LEVOTHYROXINE SODIUM 75 MCG PO TABS
175.0000 ug | ORAL_TABLET | Freq: Every day | ORAL | Status: DC
  Administered 2023-01-27: 175 ug via ORAL
  Filled 2023-01-26: qty 1

## 2023-01-26 MED ORDER — INSULIN ASPART 100 UNIT/ML IJ SOLN
0.0000 [IU] | Freq: Three times a day (TID) | INTRAMUSCULAR | Status: DC
  Administered 2023-01-27: 5 [IU] via SUBCUTANEOUS

## 2023-01-26 MED ORDER — SUCCINYLCHOLINE CHLORIDE 200 MG/10ML IV SOSY
PREFILLED_SYRINGE | INTRAVENOUS | Status: DC | PRN
  Administered 2023-01-26: 60 mg via INTRAVENOUS

## 2023-01-26 MED ORDER — GABAPENTIN 400 MG PO CAPS
400.0000 mg | ORAL_CAPSULE | Freq: Two times a day (BID) | ORAL | Status: DC
  Administered 2023-01-27: 400 mg via ORAL
  Filled 2023-01-26: qty 1

## 2023-01-26 MED ORDER — AMISULPRIDE (ANTIEMETIC) 5 MG/2ML IV SOLN: 10.0000 mg | Freq: Once | INTRAVENOUS | Status: DC | PRN

## 2023-01-26 MED ORDER — MIDAZOLAM HCL 2 MG/2ML IJ SOLN
INTRAMUSCULAR | Status: DC | PRN
  Administered 2023-01-26: 2 mg via INTRAVENOUS

## 2023-01-26 MED ORDER — EPHEDRINE 5 MG/ML INJ
INTRAVENOUS | Status: AC
  Filled 2023-01-26: qty 5

## 2023-01-26 MED ORDER — INSULIN ASPART 100 UNIT/ML IJ SOLN
INTRAMUSCULAR | Status: AC
  Administered 2023-01-26: 2 [IU] via SUBCUTANEOUS
  Filled 2023-01-26: qty 1

## 2023-01-26 MED ORDER — DEXAMETHASONE SODIUM PHOSPHATE 10 MG/ML IJ SOLN
10.0000 mg | Freq: Once | INTRAMUSCULAR | Status: DC
  Filled 2023-01-26: qty 1

## 2023-01-26 MED ORDER — SODIUM CHLORIDE 0.9 % IV SOLN
0.2000 ug/kg/min | INTRAVENOUS | Status: DC
  Filled 2023-01-26: qty 2000

## 2023-01-26 MED ORDER — LACTATED RINGERS IV SOLN
INTRAVENOUS | Status: DC | PRN
Start: 2023-01-26 — End: 2023-01-26

## 2023-01-26 MED ORDER — HYDROMORPHONE HCL 1 MG/ML IJ SOLN: 0.2500 mg | INTRAMUSCULAR | Status: DC | PRN

## 2023-01-26 MED ORDER — MIDAZOLAM HCL 2 MG/2ML IJ SOLN
INTRAMUSCULAR | Status: AC
  Filled 2023-01-26: qty 2

## 2023-01-26 MED ORDER — INSULIN ASPART 100 UNIT/ML IJ SOLN: 0.0000 [IU] | INTRAMUSCULAR | Status: DC | PRN

## 2023-01-26 MED ORDER — ONDANSETRON HCL 4 MG/2ML IJ SOLN
INTRAMUSCULAR | Status: AC
  Filled 2023-01-26: qty 2

## 2023-01-26 MED ORDER — ATORVASTATIN CALCIUM 40 MG PO TABS
40.0000 mg | ORAL_TABLET | Freq: Every day | ORAL | Status: DC
  Administered 2023-01-26 – 2023-01-27 (×2): 40 mg via ORAL
  Filled 2023-01-26 (×2): qty 1

## 2023-01-26 MED ORDER — ONDANSETRON HCL 4 MG/2ML IJ SOLN: 4.0000 mg | Freq: Four times a day (QID) | INTRAMUSCULAR | Status: DC | PRN

## 2023-01-26 MED ORDER — ESMOLOL HCL 100 MG/10ML IV SOLN
INTRAVENOUS | Status: DC | PRN
  Administered 2023-01-26 (×2): 50 mg via INTRAVENOUS

## 2023-01-26 MED ORDER — 0.9 % SODIUM CHLORIDE (POUR BTL) OPTIME
TOPICAL | Status: DC | PRN
  Administered 2023-01-26: 1000 mL

## 2023-01-26 MED ORDER — CEFAZOLIN SODIUM-DEXTROSE 2-4 GM/100ML-% IV SOLN
2.0000 g | Freq: Three times a day (TID) | INTRAVENOUS | Status: AC
  Administered 2023-01-26 – 2023-01-27 (×2): 2 g via INTRAVENOUS
  Filled 2023-01-26 (×2): qty 100

## 2023-01-26 MED ORDER — GABAPENTIN 400 MG PO CAPS
1200.0000 mg | ORAL_CAPSULE | Freq: Every day | ORAL | Status: DC
  Administered 2023-01-26: 1200 mg via ORAL
  Filled 2023-01-26: qty 3

## 2023-01-26 MED ORDER — PANTOPRAZOLE SODIUM 40 MG PO TBEC
40.0000 mg | DELAYED_RELEASE_TABLET | Freq: Two times a day (BID) | ORAL | Status: DC
  Administered 2023-01-26 – 2023-01-27 (×2): 40 mg via ORAL
  Filled 2023-01-26 (×2): qty 1

## 2023-01-26 MED ORDER — CEFAZOLIN SODIUM-DEXTROSE 2-4 GM/100ML-% IV SOLN
2.0000 g | INTRAVENOUS | Status: AC
  Administered 2023-01-26: 2 g via INTRAVENOUS
  Filled 2023-01-26: qty 100

## 2023-01-26 MED ORDER — LACTATED RINGERS IV SOLN: INTRAVENOUS | Status: DC

## 2023-01-26 MED ORDER — FENTANYL CITRATE (PF) 250 MCG/5ML IJ SOLN
INTRAMUSCULAR | Status: AC
  Filled 2023-01-26: qty 5

## 2023-01-26 MED ORDER — BUPROPION HCL ER (XL) 150 MG PO TB24
300.0000 mg | ORAL_TABLET | Freq: Every day | ORAL | Status: DC
  Administered 2023-01-26 – 2023-01-27 (×2): 300 mg via ORAL
  Filled 2023-01-26 (×2): qty 2

## 2023-01-26 MED ORDER — LACTATED RINGERS IV SOLN: INTRAVENOUS | Status: DC | PRN

## 2023-01-26 MED ORDER — DEXAMETHASONE SODIUM PHOSPHATE 10 MG/ML IJ SOLN
INTRAMUSCULAR | Status: AC
  Filled 2023-01-26: qty 1

## 2023-01-26 MED ORDER — PHENYLEPHRINE HCL-NACL 20-0.9 MG/250ML-% IV SOLN
INTRAVENOUS | Status: DC | PRN
  Administered 2023-01-26: 20 ug/min via INTRAVENOUS

## 2023-01-26 MED ORDER — TRANEXAMIC ACID-NACL 1000-0.7 MG/100ML-% IV SOLN
1000.0000 mg | INTRAVENOUS | Status: DC
  Filled 2023-01-26: qty 100

## 2023-01-26 MED ORDER — LIDOCAINE 2% (20 MG/ML) 5 ML SYRINGE
INTRAMUSCULAR | Status: AC
  Filled 2023-01-26: qty 5

## 2023-01-26 MED ORDER — ORAL CARE MOUTH RINSE: 15.0000 mL | Freq: Once | OROMUCOSAL | Status: AC

## 2023-01-26 MED ORDER — OXYCODONE HCL 5 MG PO TABS: 5.0000 mg | ORAL_TABLET | Freq: Once | ORAL | Status: DC | PRN

## 2023-01-26 MED ORDER — DEXAMETHASONE SODIUM PHOSPHATE 10 MG/ML IJ SOLN
INTRAMUSCULAR | Status: DC | PRN
  Administered 2023-01-26: 10 mg via INTRAVENOUS

## 2023-01-26 MED ORDER — OXYCODONE HCL 5 MG PO TABS
5.0000 mg | ORAL_TABLET | ORAL | Status: DC | PRN
  Administered 2023-01-26 – 2023-01-27 (×4): 10 mg via ORAL
  Filled 2023-01-26 (×4): qty 2

## 2023-01-26 MED ORDER — PHENYLEPHRINE 80 MCG/ML (10ML) SYRINGE FOR IV PUSH (FOR BLOOD PRESSURE SUPPORT)
PREFILLED_SYRINGE | INTRAVENOUS | Status: AC
  Filled 2023-01-26: qty 10

## 2023-01-26 MED ORDER — CYCLOBENZAPRINE HCL 5 MG PO TABS
5.0000 mg | ORAL_TABLET | Freq: Three times a day (TID) | ORAL | Status: DC
  Administered 2023-01-26 – 2023-01-27 (×2): 5 mg via ORAL
  Filled 2023-01-26 (×2): qty 1

## 2023-01-26 MED ORDER — ONDANSETRON HCL 4 MG/2ML IJ SOLN
INTRAMUSCULAR | Status: DC | PRN
  Administered 2023-01-26: 4 mg via INTRAVENOUS

## 2023-01-26 MED ORDER — LISINOPRIL 5 MG PO TABS
5.0000 mg | ORAL_TABLET | Freq: Every day | ORAL | Status: DC
  Administered 2023-01-27: 5 mg via ORAL
  Filled 2023-01-26: qty 1

## 2023-01-26 MED ORDER — RISPERIDONE 2 MG PO TABS
2.0000 mg | ORAL_TABLET | Freq: Every day | ORAL | Status: DC
  Filled 2023-01-26: qty 1

## 2023-01-26 MED ORDER — TRANEXAMIC ACID-NACL 1000-0.7 MG/100ML-% IV SOLN: 1000.0000 mg | Freq: Once | INTRAVENOUS | Status: DC

## 2023-01-26 SURGICAL SUPPLY — 73 items
AGENT HMST KT MTR STRL THRMB (HEMOSTASIS) ×1
ALCOHOL 70% 16 OZ (MISCELLANEOUS) ×1 IMPLANT
ALLOGRAFT LORDOTIC CC 7X11X14 (Bone Implant) IMPLANT
BAG COUNTER SPONGE SURGICOUNT (BAG) ×1 IMPLANT
BAG SPNG CNTER NS LX DISP (BAG) ×1
BAND INSRT 18 STRL LF DISP RB (MISCELLANEOUS) ×2
BAND RUBBER #18 3X1/16 STRL (MISCELLANEOUS) ×2 IMPLANT
BUR MATCHSTICK NEURO 3.0 LAGG (BURR) ×1 IMPLANT
CABLE BIPOLOR RESECTION CORD (MISCELLANEOUS) ×1 IMPLANT
CANISTER SUCT 3000ML PPV (MISCELLANEOUS) ×1 IMPLANT
CLSR STERI-STRIP ANTIMIC 1/2X4 (GAUZE/BANDAGES/DRESSINGS) ×1 IMPLANT
COVER MAYO STAND STRL (DRAPES) ×2 IMPLANT
COVER SURGICAL LIGHT HANDLE (MISCELLANEOUS) ×2 IMPLANT
DRAIN CHANNEL 15F RND FF W/TCR (WOUND CARE) IMPLANT
DRAPE C-ARM 42X72 X-RAY (DRAPES) ×1 IMPLANT
DRAPE LAPAROTOMY 100X72X124 (DRAPES) ×1 IMPLANT
DRAPE MICROSCOPE LEICA (MISCELLANEOUS) ×1 IMPLANT
DRAPE MICROSCOPE NONGLARE (MISCELLANEOUS) ×1 IMPLANT
DRAPE POUCH INSTRU U-SHP 10X18 (DRAPES) ×1 IMPLANT
DRAPE SURG 17X11 SM STRL (DRAPES) ×4 IMPLANT
DRAPE SURG 17X23 STRL (DRAPES) ×1 IMPLANT
DRAPE U-SHAPE 47X51 STRL (DRAPES) ×1 IMPLANT
DRAPE UTILITY XL STRL (DRAPES) ×1 IMPLANT
DRSG OPSITE POSTOP 3X4 (GAUZE/BANDAGES/DRESSINGS) ×1 IMPLANT
DURAPREP 26ML APPLICATOR (WOUND CARE) ×1 IMPLANT
ELECT BLADE INSULATED 4IN (ELECTROSURGICAL) ×1
ELECT COATED BLADE 2.86 ST (ELECTRODE) ×1 IMPLANT
ELECT REM PT RETURN 9FT ADLT (ELECTROSURGICAL) ×1
ELECTRODE BLADE INSULATED 4IN (ELECTROSURGICAL) ×1 IMPLANT
ELECTRODE REM PT RTRN 9FT ADLT (ELECTROSURGICAL) ×1 IMPLANT
FEE INTRAOP CADWELL SUPPLY NCS (MISCELLANEOUS) IMPLANT
FEE INTRAOP MONITOR IMPULS NCS (MISCELLANEOUS) IMPLANT
GAUZE SPONGE 4X4 12PLY STRL (GAUZE/BANDAGES/DRESSINGS) IMPLANT
GLOVE BIO SURGEON STRL SZ7.5 (GLOVE) ×2 IMPLANT
GLOVE INDICATOR 7.5 STRL GRN (GLOVE) ×1 IMPLANT
GOWN STRL REUS W/ TWL LRG LVL3 (GOWN DISPOSABLE) ×1 IMPLANT
GOWN STRL REUS W/TWL LRG LVL3 (GOWN DISPOSABLE) ×1
GOWN STRL SURGICAL XL XLNG (GOWN DISPOSABLE) ×1 IMPLANT
INTRAOP CADWELL SUPPLY FEE NCS (MISCELLANEOUS) ×1
INTRAOP DISP SUPPLY FEE NCS (MISCELLANEOUS) ×1
INTRAOP MONITOR FEE IMPULS NCS (MISCELLANEOUS) ×1
KIT BASIN OR (CUSTOM PROCEDURE TRAY) ×1 IMPLANT
KIT TURNOVER KIT B (KITS) ×1 IMPLANT
NDL HYPO 22X1.5 SAFETY MO (MISCELLANEOUS) ×1 IMPLANT
NDL SPNL 18GX3.5 QUINCKE PK (NEEDLE) ×1 IMPLANT
NEEDLE HYPO 22X1.5 SAFETY MO (MISCELLANEOUS) ×1 IMPLANT
NEEDLE SPNL 18GX3.5 QUINCKE PK (NEEDLE) ×1 IMPLANT
NS IRRIG 1000ML POUR BTL (IV SOLUTION) ×1 IMPLANT
PACK ORTHO CERVICAL (CUSTOM PROCEDURE TRAY) ×1 IMPLANT
PACK UNIVERSAL I (CUSTOM PROCEDURE TRAY) ×1 IMPLANT
PAD ARMBOARD 7.5X6 YLW CONV (MISCELLANEOUS) ×2 IMPLANT
PATTIES SURGICAL .25X.25 (GAUZE/BANDAGES/DRESSINGS) IMPLANT
PENCIL BUTTON HOLSTER BLD 10FT (ELECTRODE) ×1 IMPLANT
POSITIONER HEAD DONUT 9IN (MISCELLANEOUS) ×1 IMPLANT
RESTRAINT LIMB HOLDER UNIV (RESTRAINTS) ×1 IMPLANT
SCREW ACP 3.5 X 13 S/D VARIA (Screw) ×1 IMPLANT
SCREW ACP 3.5X13 S/D VAR ANGLE (Screw) IMPLANT
SCREW ACP VA SD 3.5X15 (Screw) IMPLANT
SPONGE INTESTINAL PEANUT (DISPOSABLE) ×1 IMPLANT
SPONGE SURGIFOAM ABS GEL SZ50 (HEMOSTASIS) ×1 IMPLANT
SURGIFLO W/THROMBIN 8M KIT (HEMOSTASIS) IMPLANT
SUT BONE WAX W31G (SUTURE) ×1 IMPLANT
SUT MNCRL AB 3-0 PS2 27 (SUTURE) ×1 IMPLANT
SUT VIC AB 2-0 CT2 18 VCP726D (SUTURE) ×1 IMPLANT
SYR BULB IRRIG 60ML STRL (SYRINGE) ×1 IMPLANT
TAPE CLOTH 4X10 WHT NS (GAUZE/BANDAGES/DRESSINGS) ×1 IMPLANT
TAPE PAPER 2X10 WHT MICROPORE (GAUZE/BANDAGES/DRESSINGS) IMPLANT
TOWEL GREEN STERILE (TOWEL DISPOSABLE) ×1 IMPLANT
TOWEL GREEN STERILE FF (TOWEL DISPOSABLE) ×1 IMPLANT
TRAY FOLEY W/BAG SLVR 16FR (SET/KITS/TRAYS/PACK) ×1
TRAY FOLEY W/BAG SLVR 16FR ST (SET/KITS/TRAYS/PACK) ×1 IMPLANT
TUBING FEATHERFLOW (TUBING) ×1 IMPLANT
WATER STERILE IRR 1000ML POUR (IV SOLUTION) ×1 IMPLANT

## 2023-01-26 NOTE — Progress Notes (Signed)
Orthopedic Surgery Post-operative Progress Note  Assessment: Patient is a 52 y.o. female who is currently admitted after undergoing C5-7 ACDF   Plan: -Operative plans complete -Needs upright films when able -Drain will be pulled tomorrow -Out of bed as tolerated with aspen collar -No bending/lifting/twisting greater than 10 pounds -OT evaluation and treat -Pain control -Diabetic diet -Ancef x2 post-operative doses -No antiplatelet or dvt chemoprophylaxis for 72 hours after surgery -Disposition: to floor from PACU  HLD -continue home lipitor  HTN -continue home lisinopril  DM  -diabetic diet -sliding scale insulin -continue home metformin   GERD -continue home protonix  IBS -hold home bentyl until discharge  Depression -continue home wellbutrin  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 Recovering in PACU. Pain is under control with current medications. Not having any radiating arm pain. Denies paresthesias and numbness. No complaints at this time.   Objective:  General: no acute distress, appropriate affect Neurologic: alert, answering questions appropriately, following commands Respiratory: unlabored breathing on nasal canula Neck: no hematoma appreciated, c collar in place Skin: dressing clear/dry/intact  MSK (spine):  -Strength exam      Right  Left Grip strength                5/5  5/5 Interosseus   5/5   5/5 Wrist extension  5/5  5/5 Wrist flexion   5/5  5/5 Elbow flexion   5/5  5/5 Deltoid    5/5  5/5  -Sensory exam    Sensation intact to light touch in C5-T1 nerve distributions of bilateral upper extremities   Patient name: Rhonda Higgins Patient MRN: [id number] Date: 01/26/23

## 2023-01-26 NOTE — Anesthesia Procedure Notes (Signed)
Procedure Name: Intubation Date/Time: 01/26/2023 12:47 PM  Performed by: Gus Puma, CRNAPre-anesthesia Checklist: Patient identified, Emergency Drugs available, Suction available, Patient being monitored and Timeout performed Patient Re-evaluated:Patient Re-evaluated prior to induction Oxygen Delivery Method: Circle system utilized Preoxygenation: Pre-oxygenation with 100% oxygen Induction Type: IV induction Ventilation: Mask ventilation without difficulty Laryngoscope Size: Glidescope and 3 Grade View: Grade I Tube type: Oral Tube size: 7.5 mm Number of attempts: 1 Airway Equipment and Method: Stylet and Video-laryngoscopy Placement Confirmation: ETT inserted through vocal cords under direct vision, CO2 detector, positive ETCO2 and breath sounds checked- equal and bilateral Secured at: 23 cm Tube secured with: Tape Dental Injury: Teeth and Oropharynx as per pre-operative assessment

## 2023-01-26 NOTE — Progress Notes (Signed)
Orthopedic Tech Progress Note Patient Details:  Rhonda Higgins Applegarth 07/01/1970 [id number]  Ortho Devices Type of Ortho Device: Aspen cervical collar Ortho Device/Splint Interventions: Ordered      [place] 01/26/2023, 1:57 PM

## 2023-01-26 NOTE — Op Note (Signed)
Orthopedic Spine Surgery Operative Report  Procedure: C5/6 and C6/7 anterior cervical discectomy and fusion C5/6 and C6/7 placement of structural allograft interbody spacer  C5, C6, C7 anterior plate instrumentation Use of intraoperative microscope  Modifier: none  Date of procedure: 01/26/2023  Patient name: Rhonda Higgins MRN: [id number] DOB: 01-18-1971  Surgeon: Willia Craze, MD Assistant: none Pre-operative diagnosis: Cervical radiculopathy, C5/6 and C6/7 foraminal stenosis Post-operative diagnosis: same as above Findings: degenerative discs at C5/6 and C6/7  Specimens: none Anesthesia: general EBL: 50cc Complications: none Pre-incision antibiotic: anncef Pre-incision decadron: 10mg  TXA given prior to incision as well  Implants:  Implant Name Type Inv. Item Serial No. Manufacturer Lot No. LRB No. Used Action  LOG [id number] - TITAN #508 CERVICAL IMPLANT SET - 1          [employer] CC [id number] - [id number] Bone Implant [employer] [id number] [employer]  N/A 1 Implanted  ALLOGRAFT [employer] CC [id number] - [id number] Bone Implant [employer] CC [id number] [employer]  N/A 1 Wasted  [employer]OGRAFT [employer] CC [id number] - [id number] Bone Implant [employer] CC [id number] [employer]  N/A 1 Implanted  SCREW ACP 3.5 X 13 S/D VARIA - [id number] Screw SCREW ACP 3.5 X 13 S/D VARIA  [employer]  N/A 1 Implanted  SCREW ACP VA SD 3.5X15 - [id number] Screw SCREW ACP VA SD 3.5X15  [employer]  N/A 5 Implanted  [employer] CC [id number] - [id number] Bone Implant [employer] CC [id number]  [employer]  N/A 1 Implanted  ALLOGRAFT [employer] CC [id number] - [id number] Bone Implant [employer]OG[employer] CC [id number]  [employer]  N/A 1 Implanted  ALLOGRAFT [employer] CC [id number] - [id number] Bone Implant [employer]OG[employer] CC [id number]  [employer]  N/A 1 Implanted      Indication for procedure: Patient is a 52 y.o. female who  presented to the office with symptoms consistent with cervical radiculopathy. The patient had tried conservative treatments that did not provide any lasting relief. As result, operative management was discussed. The pre-operative MRI showed stenosis at C5/6 and C6/7 so a C5-7 anterior cervical discectomy and fusion was presented as a treatment option. The risks, including but not limited to pseudarthrosis, dysphagia, hematoma, airway compromise, recurrent laryngeal nerve injury, esophageal perforation, durotomy, spinal cord injury, nerve root injury, persistent pain, adjacent segment disease, infection, bleeding, hardware failure, vascular injury, heart attack, death, stroke, fracture, and need for additional procedures were discussed with the patient. The benefit of surgery would be improvement in the patient's radiating arm pain. Explained that the patient may not get full relief of her pain with this surgery, especially any neck pain. The alternatives to surgical management would be continued monitoring, physical therapy, over-the-counter pain medications, injections, traction, and activity modification. All the patient's questions were answered to her satisfaction. After this discussion, the patient expressed understanding and elected to proceed with surgical intervention.  Procedure Description: The patient was met in the pre-operative holding area. The patient's identity and consent were verified. The operative site was marked. The patient's remaining questions about the surgery were answered. The patient was brought back to the operating room. General anesthesia was induced and an endotracheal tube was placed by the anesthesia staff. The patient was transferred to the flat top [place] table in the supine position. All bony prominences were well padded. A bump was placed underneath the patient's shoulders to extend the neck slightly.  Neuromonitoring leads were attached to the patient by the neuromonitoring  technologist. Baseline neuromonitoring signals were obtained.  The patient's shoulders were than taped to the bed to improve the fluoroscopic visualization during the procedure. The surgical area was cleansed with alcohol. Fluoroscopy was then brought in to check rotation on the AP image and to mark the levels on the lateral image. The patient's skin was then prepped and draped in a standard, sterile fashion. A time out was performed that identified the patient, the procedure, and the operative levels. All team members agreed with what was stated in the time out.   A left-sided transverse incision was made in the middle of the previously marked levels. Incision was taken sharply down through the skin and dermis. Electrocautery was used to dissect down to the level of the platysma. A Metzenbaum scissors was used to elevate flaps both cranially and caudally above the platysma. A small opening was made in the platysma with the Metzenbaum scissors. The scissors were then placed under the platysma and electrocautery was used on top of the scissors to split the platysma. Blunt dissection was carried out medial to the sternocleidomastoid to identify the omohyoid. The omohyoid was then dissected over its lateral aspect with the Metzenbaum scissors. An appendiceal retractor was placed around the carotid sheath to retract it laterally and a cloward retractor was placed medially to retractor the esophagus and trachea medially. The interval between these structures was then bluntly dissected with the Metzenbaum scissors. At this point, the prevertebral fascia was visible within the wound. A kittner was used to dissect the prevertebral fascia off the vertebral bodies and discs. A bayonetted needle was placed into the disc space thought to be the correct level. A lateral fluoroscopic shot was then used to confirm the C6/7 level. Once the correct level was identified, electrocautery was used to mark the disc space. The  retractors that were previously placed were then put back into the wound.   Electrocautery was used to elevate the longus coli off the bone on each side. Shadowline retractor blades were then placed under the longus coli on each side and the self-retainer was attached to these retractor blades. The operative microscope was then brought in for improved visualization. A long handle fifteen blade was then used to incise the disc space along the endplates and longitudinally to connect these end plate incisions to create a rectangular incision in the disc. A pituitary was used to remove the incised disc material. A 2 kerrison was used to remove the anterior osteophyte and square the inferior endplate of the cranial vertebra. A combination of straight and curved curettes were used to remove further disc material and the cartilaginous aspect of the endplates. This was done until both the inferior and superior endplates had been prepared from the uncus to uncus and from ventral vertebral body to the posterior osteophytes. A burr was then used to remove the posterior vertebral osteophytes. Once the PLL was visualized within the wound, a nerve hook was used to develop the plane between the PLL and the dura. While lifting up the PLL with the nerve hook, a 1 kerrison was placed under the PLL and used to remove the PLL. Once some of the PLL had been removed, a combination of 1 and 2 kerrisons were used to continue removing the PLL until it had been completely removed. A curved curette was then placed into the foramen and pointed ventrally. It was used to remove soft tissue and the deep aspect of the uncus. A nerve hook was then easily passed  into the foramen. The same process was repeated for the other foramen. Lateral fluoroscopic imaging was used to guide the insertion of trials into the disc space in a serial fashion, starting with the smallest trial. Once a good fit was obtained, the trial was left in and neuromonitoring  signals were checked. There were no changes from baseline. AP and lateral fluoroscopic images were obtained and confirmed satisfactory position of the trial. A 7mm was selected for use as the final implant. The structural allograft interbody implant was then placed into the prepared disc space and lightly malleted into place. Neuromonitoring signals were checked again and no changes from baseline were noted. AP and lateral fluoroscopic images was obtained and confirmed satisfactory position of the final implant.  The retractor blades were removed from the wound. Then, the same steps in the above paragraph were repeated for the C5/6 disc space. AP and lateral fluoroscopic images of the entire C spine were obtained which showed satisfactory positioning of both structural allograft interbody implants.   The microscope was then moved away from the operative field and the retractor blades were removed from the wound. Anesthesia staff removed the weight from the Gardner-Wells tongs and a repeat neuromonitoring check showed no change from baseline. A cloward was then used to retract the esophagus and trachea away from the ventral vertebral bodies and another was used to retract the carotid and sternocleidomastoid laterally. A plate was then selected and placed over the ventral aspects of the vertebral bodies. No soft tissue structures were underneath the plate. An awl was put into the wound and put into one of the screw holes in the plate. It was aimed slightly medially. A 15mm screw was then inserted into the hole created with the awl. The same steps were repeated with the awl and screw to place the same sized screws in the remaining holes within the plate. Again, the wound was checked and no soft tissue structures were seen underneath the plate.   Final AP and lateral fluoroscopic films were taken and confirmed satisfactory position of the plate and interbody implants. The screws were final tightened. Hemostasis  was achieved. A penrose drain was placed into the wound. The platysma was reapproximated using 2-0 vicryl. Neuromonitoring was checked and there were no changes from baseline. Neuromonitoring was discontinued at this time. The deep dermal layer was closed with 2-0 vicryl. The skin was closed with a 3-0 monocryl. All counts were correct at the end of the procedure. Benzoine and steri strips were used over the incision area. The wound was dressed with 4x4 gauze and paper tape. The patient was awakened from anesthesia and transferred to a bed where a hard cervical collar was applied. The patient was brought back to the post-anesthesia care unit in stable condition by the anesthesia staff.   Post-operative plan: The patient will recover in the post-anesthesia care unit and then go to the floor. The patient will receive two post-operative doses of ancef. The patient will be out of bed as tolerated in a cervical collar. The patient will work with physical therapy. The drain will be removed on post-operative day one. I anticipate that she will discharge to home tomorrow.   Willia Craze, MD Orthopedic Surgeon

## 2023-01-26 NOTE — H&P (Signed)
Orthopedic Spine Surgery H&P Note  Assessment: Patient is a 52 y.o. female with cervical radiculopathy   Plan: -Out of bed as tolerated, activity as tolerated, no brace -Covered the risks of surgery one more time with the patient and patient elected to proceed with planned surgery -Written consent verified -Hold anticoagulation in anticipation of surgery -Ancef and TXA on all to OR -NPO for procedure -Site marked -To OR when ready   The risks of surgery were covered one more time with her. Those risks included but were not limited to: pseudarthrosis, dysphagia, hematoma, airway compromise, recurrent laryngeal nerve injury, esophageal perforation, durotomy, spinal cord injury, nerve root injury, persistent pain, adjacent segment disease, infection, bleeding, hardware failure, vascular injury, heart attack, death, stroke, fracture, and need for additional procedures. The benefit of surgery would be improvement in the patient's radiating arm pain. Explained that the patient may not get full relief of their pain with this surgery, especially any neck pain. The alternatives to surgical management would be continued monitoring, physical therapy, over-the-counter pain medications, injections, traction, and activity modification. All the patient's questions were answered to her satisfaction. After this discussion, the patient expressed understanding and elected to proceed with surgical intervention.    ___________________________________________________________________________  Chief Complaint: neck pain that radiates into right upper extremity  History: Patient is 52 y.o. female who has been previously seen in the office for neck pain that radiated into her right upper extremity. Her work up was consistent with cervical radiculopathy. These symptoms failed to improve with conservative treatment so operative management was discussed at the last office visit. The patient presents today with no changes in  their symptoms since the last office visit. See previous office note for further details.    Review of systems: General: denies fevers and chills, myalgias Neurologic: denies recent changes in vision, slurred speech Abdomen: denies nausea, vomiting, hematemesis Respiratory: denies cough, shortness of breath  Past medical history: HLD History of uterine cancer GERD IBS Diabetes (last A1C was 6.8 on 12/26/2022) OSA Depression Postsurgical hypothyroidism Overactive bladder   Allergies: hydrocodone, adhesive tape, pholcodine, codeine   Past surgical history:  Cholecystectomy Cystoscopy with stone extraction Thyroidectomy Hysterectomy Myringectomy with removal of cholesteatoma Tympanoplasty   Social history: Denies use of nicotine product (smoking, vaping, patches, smokeless) Alcohol use: denies Denies recreational drug use  Family history: -reviewed and not pertinent to cervical radiculopathy   Physical Exam:  General: no acute distress, appears stated age Neurologic: alert, answering questions appropriately, following commands Cardiovascular: regular rate, no cyanosis Respiratory: unlabored breathing on room air, symmetric chest rise Psychiatric: appropriate affect, normal cadence to speech   MSK (spine):  -Strength exam      Left  Right Grip strength                5/5  5/5 Interosseus   5/5   5/5 Wrist extension  5/5  5/5 Wrist flexion   5/5  5/5 Elbow extension  5/5  5/5 Elbow flexion   5/5  5/5 Deltoid    5/5  5/5   -Sensory exam    Sensation intact to light touch in C5-T1 nerve distributions of bilateral upper extremities   Patient name: Rhonda Higgins Applegarth Patient MRN: [id number] Date: 01/26/23

## 2023-01-26 NOTE — Transfer of Care (Signed)
Immediate Anesthesia Transfer of Care Note  Patient: Rhonda Higgins  Procedure(s) Performed: C5-6, C6-7 ANTERIOR CERVICAL DECOMPRESSION/DISCECTOMY FUSION 2 LEVELS  Patient Location: PACU  Anesthesia Type:General  Level of Consciousness: drowsy and responds to stimulation  Airway & Oxygen Therapy: Patient Spontanous Breathing and Patient connected to face mask oxygen  Post-op Assessment: Report given to RN and Post -op Vital signs reviewed and stable  Post vital signs: Reviewed and stable  Last Vitals:  Vitals Value Taken Time  BP 155/79 01/26/23 1730  Temp    Pulse 128 01/26/23 1734  Resp 22 01/26/23 1735  SpO2 91 % 01/26/23 1734  Vitals shown include unfiled device data.  Last Pain:  Vitals:   01/26/23 1036  TempSrc:   PainSc: 0-No pain         Complications: No notable events documented.

## 2023-01-27 ENCOUNTER — Observation Stay (HOSPITAL_COMMUNITY): Payer: Medicaid Other

## 2023-01-27 DIAGNOSIS — M50122 Cervical disc disorder at C5-C6 level with radiculopathy: Secondary | ICD-10-CM | POA: Diagnosis not present

## 2023-01-27 LAB — GLUCOSE, CAPILLARY
Glucose-Capillary: 244 mg/dL — ABNORMAL HIGH (ref 70–99)
Glucose-Capillary: 228 mg/dL — ABNORMAL HIGH (ref 70–99)

## 2023-01-27 LAB — HIV ANTIBODY (ROUTINE TESTING W REFLEX): HIV Screen 4th Generation wRfx: NONREACTIVE

## 2023-01-27 MED ORDER — OXYCODONE HCL 5 MG PO TABS: 5.0000 mg | ORAL_TABLET | ORAL | 0 refills | Status: AC | PRN

## 2023-01-27 MED ORDER — ACETAMINOPHEN 500 MG PO TABS: 1000.0000 mg | ORAL_TABLET | Freq: Three times a day (TID) | ORAL | 0 refills | Status: AC

## 2023-01-27 MED ORDER — POLYETHYLENE GLYCOL 3350 17 G PO PACK: 17.0000 g | PACK | Freq: Every day | ORAL | 0 refills | Status: AC

## 2023-01-27 MED ORDER — SODIUM CHLORIDE 0.9 % IV SOLN
1.0000 g | Freq: Once | INTRAVENOUS | Status: AC
  Administered 2023-01-27: 1 g via INTRAVENOUS
  Filled 2023-01-27: qty 10

## 2023-01-27 MED ORDER — SENNA 8.6 MG PO TABS: 1.0000 | ORAL_TABLET | Freq: Two times a day (BID) | ORAL | 0 refills | Status: AC

## 2023-01-27 NOTE — Discharge Planning (Signed)
Patient alert and oriented. OT signed off. Imaging completed. IV access removed. Discharge teaching given. Patient transported to lobby for private transportation home.

## 2023-01-27 NOTE — Discharge Summary (Addendum)
Orthopedic Surgery Discharge Summary  Patient name: Rhonda Higgins Patient MRN: [id number] Admit today: 01/26/2023 Discharge date: 01/27/23  Attending physician: Willia Craze, MD Final diagnosis: cervical radiculopathy Findings: degenerative discs at C5/6 and C6/7  Hospital course: Patient is a 52 y.o. female who was admitted after undergoing C5-7 ACDF. The patient had significant pain immediately after surgery, but pain eventually was controlled with a multimodal regimen including oxycodone. Labs during the hospitalization revealed chronic anemia with no significant change from her pre-operative anemia. The patient worked with physical therapy who recommended discharge to home. The patient was tolerating an oral diet without issue and was voiding spontaneously after surgery. The patient's vitals were stable on the day of discharge. The patient's drain was removed on the day of discharge. The patient was medically ready for discharge and was discharge to home on post-operative day one.  Instructions:   Orthopedic Surgery Discharge Instructions   Patient name: Rhonda Higgins Procedure Performed: C5-7 ACDF Date of Surgery: 01/26/2023 Surgeon: Willia Craze, MD   Pre-operative Diagnosis: cervical radiculopathy Post-operative Diagnosis: same as above   Discharge Date: 01/27/2023 Discharged to: home Discharge Condition: stable   Activity: You should wear your cervical collar for six weeks after surgery. It is okay to remove the cervical collar when you are taking a shower, but you should have it on at all other times. The collar should feel snug around your neck and the chin piece under your chin. You should refrain from bending, lifting, or twisting with objects greater than ten pounds until three months after surgery. You are encouraged to walk as much as desired. You can perform household activities such as cleaning dishes, doing laundry, vacuuming, etc. as long as the  ten-pound restriction is followed.   Incision Care: Your incision site has a dressing over it. That dressing should remain in place and dry at all times for a total of one week after surgery. After one week, you can remove the dressing. Underneath the dressing, you will find pieces of tape. You should leave these pieces of tape in place. They will fall off with time. Do not pick, rub, or scrub at them. Do not put cream or lotion over the surgical area. After one week and once the dressing is off, it is okay to let soap and water run over your incision. Again, do not pick, scrub, or rub at the pieces of tape when bathing. Do not submerge (e.g., take a bath, swim, go in a hot tub, etc.) until six weeks after surgery. There may be some bloody drainage from the incision into the dressing after surgery. This is normal. You do not need to replace the dressing. Continue to leave it in place for the one week as instructed above. Should the dressing become saturated with blood or drainage, please call the office for further instructions.    Medications: You have been prescribed oxycodone. This is a narcotic pain medication and should only be taken as prescribed. You should not drink alcohol or operate heavy machinery (including driving) while taking this medication. The oxycodone can cause constipation as a side effect. For that reason, you have been prescribed senna and miralax. These are both laxatives. You do not need to take this medication if you develop diarrhea. Should you remain constipated even while taking these medications, please increase the dose of miralax to twice daily. Tylenol has been prescribed to be taken every 8 hours, which will give you additional pain relief.  Do not take NSAIDs (ibuprofen, Aleve, Celebrex, naproxen, meloxicam, etc.) for the first 6 weeks after surgery as there is some evidence that their use may decrease the chances of successful fusion.    In order to set expectations for  opioid prescriptions, you will only be prescribed opioids for a total of six weeks after surgery and, at two-weeks after surgery, your opioid prescription will start to tapered (decreased dosage and number of pills). If you have ongoing need for opioid medication six weeks after surgery, you will be referred to pain management. If you are already established with a provider that is giving you opioid medications, you should schedule an appointment with them for six weeks after surgery if you feel you are going to need another prescription. State law only allows for opioid prescriptions one week at a time. If you are running out of opioid medication near the end of the week, please call the office during business hours before running out so I can send you another prescription.    You may resume any home blood thinners (aspirin, warfarin, lovenox, apixaban, plavix, xarelto, etc.) 72 hours after your surgery. Take these medications as they were previously prescribed.    Driving: You should not drive while taking narcotic pain medications. You should also not drive if you feel you cannot turn your body enough to check your blind spots while in your cervical collar. In this case, you should wait six weeks to drive when your cervical collar will be discontinued. You should start getting back to driving slowly and you may want to try driving in a parking lot before doing anything more. Please be mindful that in some states it is illegal to drive with a cervical collar in place. You should check your local laws before driving and you may have to wait until the collar is discontinued before driving.   Diet: You are safe to resume your regular diet. A common complication after cervical spine surgery is trouble swallowing especially if the incision was made over the front part of your neck. This complication happens often and, the vast majority of the time, it is a temporary problem that gets better with time. The first  few days after surgery, you should take more bites than normal to break the food into smaller pieces before swallowing. With time as the swallowing becomes easier, you can gradually get back to taking bites like you normally would.    Reasons to Call the Office After Surgery: You should feel free to call the office with any concerns or questions you have in the post-operative period, but you should definitely notify the office if you develop: -shortness of breath, chest pain, or trouble breathing -excessive bleeding, drainage, redness, or swelling around the surgical site -fevers, chills, or pain that is getting worse with each passing day -persistent nausea or vomiting -new weakness in any extremity, new or worsening numbness or tingling in any extremity -numbness in the groin, bowel or bladder incontinence -other concerns about your surgery   Follow Up Appointments: You should have an office appointment scheduled for approximately two weeks after surgery. If you do not remember when this appointment is or do not already have it scheduled, please call the office to schedule.    Office Information:  -Willia Craze, MD -Phone number: [phone] -Address: [address]                  [place], [place] [address]

## 2023-01-27 NOTE — TOC Transition Note (Signed)
Transition of Care [facility]) - CM/SW Discharge Note   Patient Details  Name: Rhonda Higgins MRN: [id number] Date of Birth: 03/28/1971  Transition of Care [facility]) CM/SW Contact:  Ronny Bacon, RN Phone Number: 01/27/2023, 11:34 AM   Clinical Narrative:  Patient being discharged home today. Cane ordered from [employer] to be delivered to bedside. Patient aware.     Final next level of care: Home/Self Care Barriers to Discharge: No Barriers Identified   Patient Goals and CMS Choice      Discharge Placement                         Discharge Plan and Services Additional resources added to the After Visit Summary for                  DME Arranged: Gilmer Mor DME Agency: [employer] Date DME Agency Contacted: 01/27/23 Time DME Agency Contacted: 1134 Representative spoke with at DME Agency: Amy            Social Determinants of Health (SDOH) Interventions SDOH Screenings   Food Insecurity: Low Risk  (01/04/2023)   Received from [facility]  Transportation Needs: No Transportation Needs (08/20/2022)   Received from [facility], [facility]  Utilities: Not At Risk (08/20/2022)   [facility] from [facility]  Alcohol Screen: Low Risk  (04/01/2020)  Depression (PHQ2-9): Low Risk  (07/26/2022)  Financial Resource Strain: Low Risk  (08/20/2022)   Received from [facility], [facility]  Physical Activity: Inactive (08/20/2022)   Received from [facility], [facility]  Social Connections: Socially Integrated (08/20/2022)   Received from [facility], [facility]  Stress: Stress Concern Present (08/20/2022)   Received from [facility], [facility]  Tobacco Use: Low Risk  (01/17/2023)     Readmission Risk Interventions     No data to display

## 2023-01-27 NOTE — Care Management (Signed)
    Durable Medical Equipment  (From admission, onward)           Start     Ordered   01/27/23 1132  For home use only DME Cane  Once        01/27/23 1131

## 2023-01-27 NOTE — Progress Notes (Signed)
Orthopedic Surgery Post-operative Progress Note  Assessment: Patient is a 52 y.o. female who is currently admitted after undergoing C5-7 ACDF   Plan: -Operative plans complete -Upright films ordered for this morning -Drain removed this morning -Out of bed as tolerated with aspen collar -No bending/lifting/twisting greater than 10 pounds -OT evaluation and treat -Pain control -Diabetic diet -Ancef x2 post-operative doses -No antiplatelet or dvt chemoprophylaxis for 72 hours after surgery -Anticipate discharge to home today  HLD -continue home lipitor  HTN -continue home lisinopril  DM  -diabetic diet -sliding scale insulin -continue home metformin   GERD -continue home protonix  IBS -hold home bentyl until discharge  Depression -continue home wellbutrin  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 Pain well controlled with oxycodone. Tolerated breakfast well without issues. Not having much difficulty with swallowing. Reports burning sensation with voids. Has had prior UTIs, this feels the same. Arm pain has resolved since surgery. Denies paresthesias and numbness. No other complaints this morning besides the burning with voids.   Objective:  General: no acute distress, appropriate affect Neurologic: alert, answering questions appropriately, following commands Respiratory: unlabored breathing on room air Neck: no hematoma appreciated, c collar in place Skin: dressing clear/dry/intact  MSK (spine):  -Strength exam      Right  Left Grip strength                5/5  5/5 Interosseus   5/5   5/5 Wrist extension  5/5  5/5 Wrist  flexion   5/5  5/5 Elbow flexion   5/5  5/5 Deltoid    5/5  5/5  -Sensory exam    Sensation intact to light touch in C5-T1 nerve distributions of bilateral upper extremities   Patient name: Rhonda Higgins Patient MRN: [id number] Date: 01/27/23

## 2023-01-27 NOTE — Evaluation (Signed)
Occupational Therapy Evaluation Patient Details Name: Rhonda Higgins Applegarth MRN: [id number] DOB: 12-Feb-1971 Today's Date: 01/27/2023   History of Present Illness 52 y.o. female presenting with neck pain noted to have cervical radiculopathy. She is now s/p C5/6 and C6/7 anterior cervical discectomy and fusion on 01/25/23. PMH of HLD, GERB, IBS, DM, OSA, Cholecystectomy.   Clinical Impression   Pt admitted for above, PTA pt was independent in mobility and ADLs. Pt currently completing dressing and standing bADLs with mod I but ambulating in hall with Supervision. She tends to veer to the L<>R due to unsteadiness from the medication, recommend pt use a SPC at DC due to consistent use of medication at DC and for safety to maintain balance while using medications. Pt demonstrated good use of cervical body mechanics and verbalized understanding of education. Pt has no further acute skilled OT needs at this time. No follow-up OT recommended.       If plan is discharge home, recommend the following: Assistance with cooking/housework;Assist for transportation    Functional Status Assessment  Patient has not had a recent decline in their functional status  Equipment Recommendations  None recommended by OT    Recommendations for Other Services       Precautions / Restrictions Precautions Precautions: Cervical Precaution Booklet Issued: Yes (comment) Required Braces or Orthoses: Cervical Brace Cervical Brace: Hard collar;At all times Restrictions Weight Bearing Restrictions: No      Mobility Bed Mobility Overal bed mobility: Modified Independent                  Transfers Overall transfer level: Modified independent Equipment used: None                      Balance Overall balance assessment: Mild deficits observed, not formally tested                                         ADL either performed or assessed with clinical judgement   ADL Overall  ADL's : Modified independent                                       General ADL Comments: Pt completing full body dressing, toilet transfer and standing bADLs at Mod I level. Edcuated pt on C spine precautions to which they verbalized and demostrated understanding of in functional activities. Superivision assist in hall, pt complete 3 steps with HHA to simulate small hand rail near her door.     Vision         Perception         Praxis         Pertinent Vitals/Pain Pain Assessment Pain Assessment: 0-10 Pain Score: 4  Pain Location: Neck incision site Pain Descriptors / Indicators: Sore Pain Intervention(s): RN gave pain meds during session, Repositioned, Monitored during session     Extremity/Trunk Assessment Upper Extremity Assessment Upper Extremity Assessment: Overall WFL for tasks assessed   Lower Extremity Assessment Lower Extremity Assessment: Overall WFL for tasks assessed   Cervical / Trunk Assessment Cervical / Trunk Assessment: Normal   Communication Communication Communication: No apparent difficulties   Cognition Arousal: Alert Behavior During Therapy: WFL for tasks assessed/performed Overall Cognitive Status: Within Functional Limits for tasks assessed  General Comments  VSS    Exercises     Shoulder Instructions      Home Living Family/patient expects to be discharged to:: Private residence Living Arrangements: Spouse/significant other (with wife) Available Help at Discharge: Family;Available 24 hours/day Type of Home: House Home Access: Stairs to enter [employer] of Steps: 2 Entrance Stairs-Rails:  (small hand rail) Home Layout: One level (ranch style)     Bathroom Shower/Tub: Chief [employer]:  (comfort height) Bathroom Accessibility: Yes How Accessible: Accessible via walker Home Equipment: Shower seat   Additional Comments: 1 other  fall in the last year due to losing balance.      Prior Functioning/Environment Prior Level of Function : Independent/Modified Independent             Mobility Comments: no AD ADLs Comments: ind        OT Problem List: Impaired balance (sitting and/or standing)      OT Treatment/Interventions: Balance training;Self-care/ADL training;Therapeutic exercise;Therapeutic activities;Patient/family education;DME and/or AE instruction    OT Goals(Current goals can be found in the care plan section) Acute Rehab OT Goals Patient Stated Goal: To go home OT Goal Formulation: With patient Time For Goal Achievement: 02/10/23 Potential to Achieve Goals: Good  OT Frequency: Min 1X/week    Co-evaluation              AM-PAC OT "6 Clicks" Daily Activity     Outcome Measure Help from another person eating meals?: None Help from another person taking care of personal grooming?: None Help from another person toileting, which includes using toliet, bedpan, or urinal?: None Help from another person bathing (including washing, rinsing, drying)?: None Help from another person to put on and taking off regular upper body clothing?: None Help from another person to put on and taking off regular lower body clothing?: None 6 Click Score: 24   End of Session Nurse Communication: Mobility status  Activity Tolerance: Patient tolerated treatment well Patient left: in bed;with call bell/phone within reach  OT Visit Diagnosis: Unsteadiness on feet (R26.81)                Time: 2956-2130 OT Time Calculation (min): 26 min Charges:  OT General Charges $OT Visit: 1 Visit OT Evaluation $OT Eval Moderate Complexity: 1 Mod OT Treatments $Therapeutic Activity: 8-22 mins  01/27/2023  AB, OTR/L  Acute Rehabilitation Services  Office: [phone]   Tristan Schroeder 01/27/2023, 10:51 AM

## 2023-01-27 NOTE — Anesthesia Postprocedure Evaluation (Signed)
Anesthesia Post Note  Patient: Rhonda Higgins  Procedure(s) Performed: C5-6, C6-7 ANTERIOR CERVICAL DECOMPRESSION/DISCECTOMY FUSION 2 LEVELS     Patient location during evaluation: PACU Anesthesia Type: General Level of consciousness: awake and alert Pain management: pain level controlled Vital Signs Assessment: post-procedure vital signs reviewed and stable Respiratory status: spontaneous breathing, nonlabored ventilation and respiratory function stable Cardiovascular status: stable and blood pressure returned to baseline Anesthetic complications: no   No notable events documented.  Last Vitals:  Vitals:   01/27/23 0525 01/27/23 0700  BP: 127/74 105/71  Pulse: 90 90  Resp: 15 16  Temp: 36.8 C   SpO2: 94% 93%    Last Pain:  Vitals:   01/27/23 0250  TempSrc:   PainSc: Asleep                 Beryle Lathe

## 2023-01-27 NOTE — Discharge Instructions (Addendum)
Orthopedic Surgery Discharge Instructions  Patient name: Rhonda Higgins Procedure Performed: C5-7 ACDF Date of Surgery: 01/26/2023 Surgeon: Willia Craze, MD  Pre-operative Diagnosis: cervical radiculopathy Post-operative Diagnosis: same as above  Discharge Date: 01/27/2023 Discharged to: home Discharge Condition: stable  Activity: You should wear your cervical collar for six weeks after surgery. It is okay to remove the cervical collar when you are taking a shower, but you should have it on at all other times. The collar should feel snug around your neck and the chin piece under your chin. You should refrain from bending, lifting, or twisting with objects greater than ten pounds until three months after surgery. You are encouraged to walk as much as desired. You can perform household activities such as cleaning dishes, doing laundry, vacuuming, etc. as long as the ten-pound restriction is followed.  Incision Care: Your incision site has a dressing over it. That dressing should remain in place and dry at all times for a total of one week after surgery. After one week, you can remove the dressing. Underneath the dressing, you will find pieces of tape. You should leave these pieces of tape in place. They will fall off with time. Do not pick, rub, or scrub at them. Do not put cream or lotion over the surgical area. After one week and once the dressing is off, it is okay to let soap and water run over your incision. Again, do not pick, scrub, or rub at the pieces of tape when bathing. Do not submerge (e.g., take a bath, swim, go in a hot tub, etc.) until six weeks after surgery. There may be some bloody drainage from the incision into the dressing after surgery. This is normal. You do not need to replace the dressing. Continue to leave it in place for the one week as instructed above. Should the dressing become saturated with blood or drainage, please call the office for further instructions.    Medications: You have been prescribed oxycodone. This is a narcotic pain medication and should only be taken as prescribed. You should not drink alcohol or operate heavy machinery (including driving) while taking this medication. The oxycodone can cause constipation as a side effect. For that reason, you have been prescribed senna and miralax. These are both laxatives. You do not need to take this medication if you develop diarrhea. Should you remain constipated even while taking these medications, please increase the dose of miralax to twice daily. Tylenol has been prescribed to be taken every 8 hours, which will give you additional pain relief.   Do not take NSAIDs (ibuprofen, Aleve, Celebrex, naproxen, meloxicam, etc.) for the first 6 weeks after surgery as there is some evidence that their use may decrease the chances of successful fusion.   In order to set expectations for opioid prescriptions, you will only be prescribed opioids for a total of six weeks after surgery and, at two-weeks after surgery, your opioid prescription will start to tapered (decreased dosage and number of pills). If you have ongoing need for opioid medication six weeks after surgery, you will be referred to pain management. If you are already established with a provider that is giving you opioid medications, you should schedule an appointment with them for six weeks after surgery if you feel you are going to need another prescription. State law only allows for opioid prescriptions one week at a time. If you are running out of opioid medication near the end of the week, please call the office  during business hours before running out so I can send you another prescription.   You may resume any home blood thinners (aspirin, warfarin, lovenox, apixaban, plavix, xarelto, etc.) 72 hours after your surgery. Take these medications as they were previously prescribed.   Driving: You should not drive while taking narcotic pain  medications. You should also not drive if you feel you cannot turn your body enough to check your blind spots while in your cervical collar. In this case, you should wait six weeks to drive when your cervical collar will be discontinued. You should start getting back to driving slowly and you may want to try driving in a parking lot before doing anything more. Please be mindful that in some states it is illegal to drive with a cervical collar in place. You should check your local laws before driving and you may have to wait until the collar is discontinued before driving.  Diet: You are safe to resume your regular diet. A common complication after cervical spine surgery is trouble swallowing especially if the incision was made over the front part of your neck. This complication happens often and, the vast majority of the time, it is a temporary problem that gets better with time. The first few days after surgery, you should take more bites than normal to break the food into smaller pieces before swallowing. With time as the swallowing becomes easier, you can gradually get back to taking bites like you normally would.   Reasons to Call the Office After Surgery: You should feel free to call the office with any concerns or questions you have in the post-operative period, but you should definitely notify the office if you develop: -shortness of breath, chest pain, or trouble breathing -excessive bleeding, drainage, redness, or swelling around the surgical site -fevers, chills, or pain that is getting worse with each passing day -persistent nausea or vomiting -new weakness in any extremity, new or worsening numbness or tingling in any extremity -numbness in the groin, bowel or bladder incontinence -other concerns about your surgery  Follow Up Appointments: You should have an office appointment scheduled for approximately two weeks after surgery. If you do not remember when this appointment is or do not  already have it scheduled, please call the office to schedule.   Office Information:  -Willia Craze, MD -Phone number: [phone] -Address: [address]       [place], [place] [address]

## 2023-01-29 ENCOUNTER — Encounter (HOSPITAL_COMMUNITY): Payer: Self-pay | Admitting: Orthopedic Surgery

## 2023-01-31 ENCOUNTER — Telehealth: Payer: Self-pay | Admitting: Surgical

## 2023-01-31 ENCOUNTER — Telehealth: Payer: Self-pay

## 2023-01-31 NOTE — Telephone Encounter (Signed)
Patient would like a call back .  Couldn't really understand what was being said.  Message was left on Triage phone.  CB#[phone].  Please advise.

## 2023-01-31 NOTE — Telephone Encounter (Signed)
I called she was asking about when she can tae off the collar, per Dr.Moore she should wear it x 6 weeks, but if she is sitting and watching TV she can take it off for a few minutes, she aslo asked about showering, per Dr. Christell Constant  week postop. Patient was advised of the answer from Dr. Christell Constant

## 2023-01-31 NOTE — Telephone Encounter (Signed)
Patient called office on-call number with her wife.  She and her wife describe numbness and tingling sensation all over her body.  No fevers or chills.  No new neck pain.  Dressing appears without issue.  No motor dysfunction.  Really no complaints aside from subjective chills.  She discontinued oxycodone on around 2 AM today after taking 10 mg every 6 hours for postop pain.  Pain is pretty well-controlled now so she abruptly discontinued this medication.  Based on her description of tingling and crawling sensation across her body, she is likely experiencing opioid withdrawal symptoms.  Recommended that she resume the oxycodone at a lower dose and take this every 8 hours for the next day then every 12 hours for the next day and then once on the following day and try and wean off after that completely.  She will try this but if symptoms worsen overnight, recommended she either call the office on-call number back or report straight to the emergency department.  Patient and wife agreed with plan.

## 2023-02-01 ENCOUNTER — Telehealth: Payer: Self-pay | Admitting: Orthopedic Surgery

## 2023-02-01 NOTE — Telephone Encounter (Signed)
Pt called in stating she may be late for her appt on 08/23 at 9:15 due to spouse having an appt on the same day at 8am that morning she said she will call if she feels she may be running behind I advised her she has the option to reschedule but I'm not sure if the doctor would want her to do that being it is her first post op please advise if it is okay if pt comes in late or if it may be best to [facility]

## 2023-02-01 NOTE — Telephone Encounter (Signed)
Noted  

## 2023-02-05 ENCOUNTER — Encounter (HOSPITAL_COMMUNITY): Payer: Self-pay | Admitting: Orthopedic Surgery

## 2023-02-09 ENCOUNTER — Ambulatory Visit: Payer: Medicaid Other | Admitting: Orthopedic Surgery

## 2023-02-09 ENCOUNTER — Other Ambulatory Visit (INDEPENDENT_AMBULATORY_CARE_PROVIDER_SITE_OTHER): Payer: Medicaid Other

## 2023-02-09 DIAGNOSIS — M542 Cervicalgia: Secondary | ICD-10-CM

## 2023-02-09 NOTE — Progress Notes (Signed)
Orthopedic Surgery Post-operative Office Visit  Procedure: C5-7 ACDF Date of Surgery: 01/26/2023 (~2 weeks post-op)  Assessment: Patient is a 52 y.o. female who is having some dysphagia but that is improving with time.  Arm pain and numbness/paresthesias have resolved   Plan: -Operative plans complete -Out of bed as tolerated in aspen collar -Okay to let incision be open to air -Okay to let soap/water run over the incision, do not submerge -No bending/lifting/twisting greater than 10 pounds -Pain management: weaning oxycodone -Return to office in 4 weeks, films needed at next visit: AP/lateral cervical  ___________________________________________________________________________   Subjective: Patient has been doing well since surgery.  She is at home.  Her radiating arm pain and numbness/paresthesias resolved almost immediately after surgery.  She has been having difficulty with swallowing but it has been improving with time.  She also notes pain at the upper thoracic spine where the collar abuts the skin.  She is not having any other pain at this time.  Has not noticed any redness or drainage around her incision.  Objective:  General: no acute distress, appropriate affect Neurologic: alert, answering questions appropriately, following commands Respiratory: unlabored breathing on room air Skin: incision is well approximated with no erythema, induration, active/expressible drainage  MSK (spine):  -Strength exam      Left  Right Grip strength                5/5  5/5 Interosseus   5/5   5/5 Wrist extension  5/5  5/5 Wrist flexion   5/5  5/5 Elbow flexion   5/5  5/5 Deltoid    5/5  5/5  -Sensory exam    Sensation intact to light touch in C5-T1 nerve distributions of bilateral upper extremities   Imaging: X-rays of the cervical spine taken 02/09/2023 were independently reviewed and interpreted, showing anterior cervical instrumentation from C5-C7.  Screws in appropriate  position with no lucency around them.  Interbody allograft at C5/6 and C6/7 appear in appropriate position.  No fracture or dislocation seen.   Patient name: Rhonda Higgins Patient MRN: [id number] Date of visit: 02/09/23

## 2023-02-11 ENCOUNTER — Ambulatory Visit
Admission: RE | Admit: 2023-02-11 | Discharge: 2023-02-11 | Disposition: A | Discharge status: Home / Self Care | Payer: Medicaid Other | Source: Ambulatory Visit | Attending: Gastroenterology | Admitting: Gastroenterology

## 2023-02-11 DIAGNOSIS — K7469 Other cirrhosis of liver: Secondary | ICD-10-CM

## 2023-02-11 DIAGNOSIS — K7689 Other specified diseases of liver: Secondary | ICD-10-CM

## 2023-02-11 MED ORDER — GADOPICLENOL 0.5 MMOL/ML IV SOLN
10.0000 mL | Freq: Once | INTRAVENOUS | Status: AC | PRN
  Administered 2023-02-11: 10 mL via INTRAVENOUS

## 2023-03-09 ENCOUNTER — Other Ambulatory Visit (INDEPENDENT_AMBULATORY_CARE_PROVIDER_SITE_OTHER): Payer: Medicaid Other

## 2023-03-09 ENCOUNTER — Ambulatory Visit (INDEPENDENT_AMBULATORY_CARE_PROVIDER_SITE_OTHER): Payer: Medicaid Other | Admitting: Orthopedic Surgery

## 2023-03-09 DIAGNOSIS — Z981 Arthrodesis status: Secondary | ICD-10-CM

## 2023-03-09 NOTE — Progress Notes (Signed)
Orthopedic Surgery Post-operative Office Visit   Procedure: C5-7 ACDF Date of Surgery: 01/26/2023 (~6 weeks post-op)   Assessment: Patient is a 52 y.o. female who is doing well after surgery     Plan: -Operative plans complete -Out of bed as tolerated, can discontinue cervical collar -Okay to submerge wound at this time -No bending/lifting/twisting greater than 10 pounds -Pain management: OTC medication -Return to office in 6 weeks, films needed at next visit: AP/lateral/flex/ex cervical   ___________________________________________________________________________     Subjective: Patient has been doing well since she was last seen.  She is not having any pain rating into either upper extremity.  She is not having any neck pain.  She is not taking any medications for pain.  She has not noticed any redness or drainage around her incision.  She has been using her cervical collar. She is pleased with her outcome at this time.    Objective:   General: no acute distress, appropriate affect Neurologic: alert, answering questions appropriately, following commands Respiratory: unlabored breathing on room air Skin: incision is well healed with no erythema, induration, active/expressible drainage   MSK (spine):   -Strength exam                                                   Left                  Right Grip strength                5/5                  5/5 Interosseus                  5/5                  5/5 Wrist extension            5/5                  5/5 Wrist flexion                 5/5                  5/5 Elbow flexion                5/5                  5/5 Deltoid                          5/5                  5/5   -Sensory exam                           Sensation intact to light touch in C5-T1 nerve distributions of bilateral upper extremities     Imaging: X-rays of the cervical spine taken 03/09/2023 were independently reviewed and interpreted, showing anterior  cervical instrumentation from C5-C7.  Screws in appropriate position with no lucency around them and none have backed out.  Interbody allografts at C5/6 and C6/7 appear in appropriate position.  No fracture or dislocation seen.     Patient name: Rhonda Higgins  Patient MRN: [id number] Date of visit: 03/09/23

## 2023-03-15 NOTE — Assessment & Plan Note (Deleted)
Lab review: 07/19/2014: Platelets 134 11/14/2017: Platelets 103 03/31/2020: Platelets 109 04/18/2021: Platelets 81 08/27/2021: Platelets 79 09/09/2021: Platelets 82, B12 246, immature platelet fraction: 2.9% 01/27/2023: Platelets 93  The low immature platelet fraction rules out ITP. Mild thrombocytopenia: Most likely related to cirrhosis of the liver.   Slightly low B12 levels: Encouraged her to take 1000 mcg of sublingual B12   Ultrasound abdomen 04/19/2021: Cirrhosis of liver   Given the fact that the other blood cell lines are intact there is no current indication for bone marrow biopsy. Return to clinic in 1 with labs and follow-up

## 2023-03-19 ENCOUNTER — Inpatient Hospital Stay: Payer: Medicaid Other | Admitting: Hematology and Oncology

## 2023-03-19 DIAGNOSIS — D696 Thrombocytopenia, unspecified: Secondary | ICD-10-CM

## 2023-03-28 ENCOUNTER — Ambulatory Visit: Payer: Medicaid Other | Admitting: Podiatry

## 2023-03-28 ENCOUNTER — Encounter: Payer: Self-pay | Admitting: Podiatry

## 2023-03-28 DIAGNOSIS — M79675 Pain in left toe(s): Secondary | ICD-10-CM

## 2023-03-28 DIAGNOSIS — E1142 Type 2 diabetes mellitus with diabetic polyneuropathy: Secondary | ICD-10-CM

## 2023-03-28 DIAGNOSIS — M79674 Pain in right toe(s): Secondary | ICD-10-CM | POA: Diagnosis not present

## 2023-03-28 DIAGNOSIS — B351 Tinea unguium: Secondary | ICD-10-CM

## 2023-03-30 ENCOUNTER — Other Ambulatory Visit: Payer: Self-pay | Admitting: Gastroenterology

## 2023-04-01 NOTE — Progress Notes (Signed)
Subjective:  Patient ID: Rhonda Higgins, female    DOB: [birth year],  MRN: [id number]  Rhonda Higgins presents to clinic today for: at risk foot care with history of diabetic neuropathy and painful, discolored, thick toenails which interfere with daily activities  Chief Complaint  Patient presents with   Diabetes    BS-Didn't take A1C-6.8 LVPCP-02/2023    PCP is Knox Royalty, MD.  Allergies  Allergen Reactions   Hydrocodone Shortness Of Breath and Itching   Tape Rash    Paper tape is ok   Wound Dressing Adhesive Rash   Pholcodine Itching   Codeine Itching    Review of Systems: Negative except as noted in the HPI.  Objective: No changes noted in today's physical examination. There were no vitals filed for this visit.  Rhonda Higgins is a pleasant 52 y.o. female in NAD. AAO x 3.  Vascular Examination: Capillary refill time <3 seconds b/l LE. Palpable pedal pulses b/l LE. Digital hair present b/l. No pedal edema b/l. Skin temperature gradient WNL b/l. No varicosities b/l.  Dermatological Examination: Pedal skin with normal turgor, texture and tone b/l. No open wounds. No interdigital macerations b/l. Toenails 1-5 b/l thickened, discolored, dystrophic with subungual debris. There is pain on palpation to dorsal aspect of nailplates. No corns, calluses nor porokeratotic lesions noted.  Neurological Examination: Protective sensation intact with 10 gram monofilament b/l LE. Vibratory sensation intact b/l LE. Pt has subjective symptoms of neuropathy.  Musculoskeletal Examination: Muscle strength 5/5 to all lower extremity muscle groups bilaterally. No pain, crepitus or joint limitation noted with ROM bilateral LE. No gross bony deformities bilaterally.  Assessment/Plan: 1. Pain due to onychomycosis of toenails of both feet   2. Diabetic peripheral neuropathy associated with type 2 diabetes mellitus (HCC)    -Consent given for treatment as  described below: -Examined patient. -No new findings. No new orders. -Continue supportive shoe gear daily. -Mycotic toenails 1-5 bilaterally were debrided in length and girth with sterile nail nippers and dremel without incident. -Patient/POA to call should there be question/concern in the interim.   Return in about 3 months (around 06/28/2023).  Freddie Breech, DPM

## 2023-04-03 ENCOUNTER — Encounter (HOSPITAL_COMMUNITY): Payer: Self-pay | Admitting: Gastroenterology

## 2023-04-03 NOTE — Progress Notes (Signed)
Attempted to obtain medical history for pre op call via telephone, unable to reach at this time. HIPAA compliant voicemail message left requesting return call to pre surgical testing department.

## 2023-04-06 ENCOUNTER — Emergency Department (HOSPITAL_BASED_OUTPATIENT_CLINIC_OR_DEPARTMENT_OTHER)
Admission: EM | Admit: 2023-04-06 | Discharge: 2023-04-06 | Disposition: A | Discharge status: Home / Self Care | Payer: Medicaid Other | Attending: Emergency Medicine | Admitting: Emergency Medicine

## 2023-04-06 ENCOUNTER — Encounter (HOSPITAL_BASED_OUTPATIENT_CLINIC_OR_DEPARTMENT_OTHER): Payer: Self-pay | Admitting: Emergency Medicine

## 2023-04-06 ENCOUNTER — Emergency Department (HOSPITAL_BASED_OUTPATIENT_CLINIC_OR_DEPARTMENT_OTHER): Payer: Medicaid Other

## 2023-04-06 ENCOUNTER — Other Ambulatory Visit: Payer: Self-pay

## 2023-04-06 DIAGNOSIS — Z794 Long term (current) use of insulin: Secondary | ICD-10-CM | POA: Diagnosis not present

## 2023-04-06 DIAGNOSIS — E119 Type 2 diabetes mellitus without complications: Secondary | ICD-10-CM | POA: Insufficient documentation

## 2023-04-06 DIAGNOSIS — R109 Unspecified abdominal pain: Secondary | ICD-10-CM | POA: Insufficient documentation

## 2023-04-06 DIAGNOSIS — R1084 Generalized abdominal pain: Secondary | ICD-10-CM

## 2023-04-06 LAB — COMPREHENSIVE METABOLIC PANEL
ALT: 15 U/L (ref 0–44)
AST: 18 U/L (ref 15–41)
Albumin: 3.8 g/dL (ref 3.5–5.0)
Alkaline Phosphatase: 81 U/L (ref 38–126)
Anion gap: 7 (ref 5–15)
BUN: 6 mg/dL (ref 6–20)
CO2: 29 mmol/L (ref 22–32)
Calcium: 8.3 mg/dL — ABNORMAL LOW (ref 8.9–10.3)
Chloride: 104 mmol/L (ref 98–111)
Creatinine, Ser: 0.58 mg/dL (ref 0.44–1.00)
GFR, Estimated: >60 mL/min (ref >60)
Glucose, Bld: 168 mg/dL — ABNORMAL HIGH (ref 70–99)
Potassium: 3.5 mmol/L (ref 3.5–5.1)
Sodium: 140 mmol/L (ref 135–145)
Total Bilirubin: 0.8 mg/dL (ref 0.3–1.2)
Total Protein: 6.3 g/dL — ABNORMAL LOW (ref 6.5–8.1)

## 2023-04-06 LAB — CBC
HCT: 33.9 % — ABNORMAL LOW (ref 36.0–46.0)
Hemoglobin: 9.5 g/dL — ABNORMAL LOW (ref 12.0–15.0)
MCH: 19.2 pg — ABNORMAL LOW (ref 26.0–34.0)
MCHC: 28 g/dL — ABNORMAL LOW (ref 30.0–36.0)
MCV: 68.5 fL — ABNORMAL LOW (ref 80.0–100.0)
Platelets: 99 10*3/uL — ABNORMAL LOW (ref 150–400)
RBC: 4.95 MIL/uL (ref 3.87–5.11)
RDW: 19.9 % — ABNORMAL HIGH (ref 11.5–15.5)
WBC: 3.5 10*3/uL — ABNORMAL LOW (ref 4.0–10.5)
nRBC: 0 % (ref 0.0–0.2)

## 2023-04-06 LAB — URINALYSIS, ROUTINE W REFLEX MICROSCOPIC
Bilirubin Urine: NEGATIVE
Glucose, UA: 100 mg/dL — AB
Hgb urine dipstick: NEGATIVE
Ketones, ur: NEGATIVE mg/dL
Nitrite: NEGATIVE
Protein, ur: NEGATIVE mg/dL
Specific Gravity, Urine: 1.015 (ref 1.005–1.030)
pH: 7.5 (ref 5.0–8.0)

## 2023-04-06 LAB — LIPASE, BLOOD: Lipase: 31 U/L (ref 11–51)

## 2023-04-06 MED ORDER — ALUM & MAG HYDROXIDE-SIMETH 200-200-20 MG/5ML PO SUSP
30.0000 mL | Freq: Once | ORAL | Status: AC
  Administered 2023-04-06: 30 mL via ORAL
  Filled 2023-04-06: qty 30

## 2023-04-06 MED ORDER — HYOSCYAMINE SULFATE 0.125 MG SL SUBL
0.2500 mg | SUBLINGUAL_TABLET | Freq: Once | SUBLINGUAL | Status: AC
  Administered 2023-04-06: 0.25 mg via SUBLINGUAL
  Filled 2023-04-06: qty 2

## 2023-04-06 MED ORDER — IOHEXOL 300 MG/ML  SOLN
100.0000 mL | Freq: Once | INTRAMUSCULAR | Status: AC | PRN
  Administered 2023-04-06: 100 mL via INTRAVENOUS

## 2023-04-06 NOTE — ED Provider Notes (Signed)
[place] EMERGENCY DEPARTMENT AT [facility] Provider Note   CSN: [id number] Arrival date & time: 04/06/23  1354     History  Chief Complaint  Patient presents with   Abdominal Pain    Rhonda Higgins is a 52 y.o. female with past medical history of hyperlipidemia obstructive sleep apnea diabetes GERD cirrhosis of the liver presenting to emergency room with abdominal pain.  Abdominal pain occurs directly after eating and lasts approximately an hour.  Patient reports she has history of bleeding ulcer in stomach, follows with GI.  Patient reports she has follow-up endoscopy for Tuesday.  Patient reports she is currently not in any pain.  Denies any episodes of blood in vomit or in stool. Patient denies any associated fever, chills, nausea, vomiting or diarrhea.   Abdominal Pain      Home Medications Prior to Admission medications   Medication Sig Start Date End Date Taking? Authorizing Provider  ACCRUFER 30 MG CAPS Take 1 capsule by mouth 2 (two) times daily. 09/14/22   [provider]  ACCU-CHEK GUIDE test strip  03/23/20   [provider]  Accu-Chek Softclix Lancets lancets daily. 07/21/20   [provider]  atorvastatin (LIPITOR) 40 MG tablet Take 40 mg by mouth daily. 08/22/20   [provider]  buPROPion (WELLBUTRIN XL) 300 MG 24 hr tablet Take 300 mg by mouth daily.    [provider]  cetirizine (ZYRTEC) 10 MG tablet Take 10 mg by mouth daily as needed for allergies.    [provider]  Cyanocobalamin (B-12 PO) Take 1 tablet by mouth daily.    [provider]  dicyclomine (BENTYL) 20 MG tablet Take 20 mg by mouth 4 (four) times daily as needed for spasms. 10/05/22   [provider]  gabapentin (NEURONTIN) 400 MG capsule Take 400-1,200 mg by mouth See admin instructions. Takes 400 mg in the morning 400 mg in the afternoon and 1200 mg at night    [provider]  insulin glargine  (LANTUS) 100 UNIT/ML injection Inject 20 Units into the skin at bedtime.    [provider]  lisinopril (PRINIVIL,ZESTRIL) 5 MG tablet Take 5 mg by mouth daily.    [provider]  metFORMIN (GLUCOPHAGE-XR) 500 MG 24 hr tablet Take 500 mg by mouth 2 (two) times daily. 10/13/21   [provider]  methocarbamol (ROBAXIN) 500 MG tablet Take 1 tablet (500 mg total) by mouth every 8 (eight) hours as needed for muscle spasms. Patient not taking: Reported on 01/10/2023 11/17/22   Zadie Rhine, MD  ondansetron (ZOFRAN-ODT) 4 MG disintegrating tablet Take 4 mg by mouth every 6 (six) hours as needed for vomiting or nausea.    [provider]  OZEMPIC, 0.25 OR 0.5 MG/DOSE, 2 MG/3ML SOPN Inject 0.5 mg into the skin every Sunday. 12/26/22   [provider]  pantoprazole (PROTONIX) 40 MG tablet Take 40 mg by mouth 2 (two) times daily.     [provider]  Peppermint Oil (IBGARD) 90 MG CPCR Take 2 tablets by mouth daily as needed (IBS). 12/08/19   [provider]  risperiDONE (RISPERDAL) 2 MG tablet Take 2 mg by mouth at bedtime.  08/22/16   [provider]  SYNTHROID 175 MCG tablet Take 175 mcg by mouth daily. 07/27/20   [provider]  traZODone (DESYREL) 50 MG tablet Take 1 tablet (50 mg total) by mouth at bedtime. Patient taking differently: Take 50 mg by mouth at bedtime  as needed for sleep. 04/18/21   Leslye Peer, MD  Vitamin D, Ergocalciferol, (DRISDOL) 1.25 MG (50000 UNIT) CAPS capsule Take 50,000 Units by mouth once a week. Sunday 02/15/22   [provider]      Allergies    Hydrocodone, Tape, Wound dressing adhesive, Pholcodine, and Codeine    Review of Systems   Review of Systems  Gastrointestinal:  Positive for abdominal pain.    Physical Exam Updated Vital Signs BP (!) 144/81 (BP Location: Right Arm)   Pulse 87   Temp 97.8 F (36.6 C)   Resp 16   SpO2 99%  Physical Exam Vitals and nursing note  reviewed.  Constitutional:      General: She is not in acute distress.    Appearance: She is not ill-appearing or toxic-appearing.  HENT:     Head: Normocephalic and atraumatic.  Eyes:     General: No scleral icterus.    Conjunctiva/sclera: Conjunctivae normal.  Cardiovascular:     Rate and Rhythm: Normal rate and regular rhythm.     Pulses: Normal pulses.     Heart sounds: Normal heart sounds.  Pulmonary:     Effort: Pulmonary effort is normal. No respiratory distress.     Breath sounds: Normal breath sounds. No wheezing.  Abdominal:     General: Abdomen is flat. Bowel sounds are normal. There is no distension.     Palpations: Abdomen is soft. There is no mass.     Tenderness: There is no abdominal tenderness.  Musculoskeletal:     Right lower leg: No edema.     Left lower leg: No edema.  Skin:    General: Skin is warm and dry.     Capillary Refill: Capillary refill takes less than 2 seconds.     Findings: No lesion.  Neurological:     General: No focal deficit present.     Mental Status: She is alert and oriented to person, place, and time. Mental status is at baseline.     ED Results / Procedures / Treatments   Labs (all labs ordered are listed, but only abnormal results are displayed) Labs Reviewed  COMPREHENSIVE METABOLIC PANEL - Abnormal; Notable for the following components:      Result Value   Glucose, Bld 168 (*)    Calcium 8.3 (*)    Total Protein 6.3 (*)    All other components within normal limits  CBC - Abnormal; Notable for the following components:   WBC 3.5 (*)    Hemoglobin 9.5 (*)    HCT 33.9 (*)    MCV 68.5 (*)    MCH 19.2 (*)    MCHC 28.0 (*)    RDW 19.9 (*)    Platelets 99 (*)    All other components within normal limits  URINALYSIS, ROUTINE W REFLEX MICROSCOPIC - Abnormal; Notable for the following components:   APPearance HAZY (*)    Glucose, UA 100 (*)    Leukocytes,Ua SMALL (*)    Bacteria, UA RARE (*)    All other components within  normal limits  LIPASE, BLOOD    EKG None  Radiology No results found.  Procedures Procedures    Medications Ordered in ED Medications - No data to display  ED Course/ Medical Decision Making/ A&P                                 Medical Decision Making Amount  and/or Complexity of Data Reviewed Labs: ordered. Radiology: ordered.  Risk OTC drugs. Prescription drug management.   Rhonda Higgins 52 y.o. presented today for abd pain. Working DDx includes, but not limited to, gastroenteritis, colitis, SBO, appendicitis, cholecystitis, hepatobiliary pathology, gastritis, PUD, ACS, dissection, pancreatitis, nephrolithiasis, AAA, UTI, pyelonephritis  R/o DDx: These are considered less likely than current impression due to history of present illness, physical exam, labs/imaging findings.  Review of prior external notes: 03/28/23  Pmhx: hyperlipidemia obstructive sleep apnea diabetes GERD cirrhosis  Unique Tests and My Interpretation:  CBC with differential: WBC 13.5, hemoglobin 9.5 which is similar to priors and hemoglobin CMP: Glucose 168, no electrolyte abnormality, elevated LFT Lipase: 31 UA: Negative nitrates, small leukocytes, no hgb patient is not having any dysuria or suprapubic pain   Imaging:  CT Abd/Pelvis with contrast: evaluate for structural/surgical etiology of patients' severe abdominal pain.    Problem List / ED Course / Critical interventions / Medication management  Patient reporting to emergency room with pain after eating.  Pain last for an hour.  Patient hemodynamically stable and lab work came back reassuring.  Patient has stable hemoglobin and no electrolyte abnormality.  Patient was able to tolerate GI cocktail.  Patient feels better and is ready to go home.  Patient has follow-up with GI on Tuesday for endoscopy, following for bleeding ulcer.  Patient has had no blood in the stool or in vomit.  CT scan shows no acute pathology.  Given that  patient is tolerating p.o., patient is currently not in pain and had no episodes of vomiting since arrival, stable for discharge GI.  Patient agrees and understands plan. I ordered medication including GI cocktail with hyoscyamine  Reevaluation of the patient after these medicines showed that the patient improved Patients vitals assessed. Upon arrival patient is hemodynamically stable.  I have reviewed the patients home medicines and have made adjustments as needed    Consult: None   Plan: F/u w/ GI at appointment, take Protonix, and Zofran as needed.  F/u w/ PCP in 2-3d to ensure resolution of sx.  Patient was given return precautions. Patient stable for discharge at this time.  Patient educated on current sx/dx and verbalized understanding of plan. Return to ER w/ new or worsening sx.          Final Clinical Impression(s) / ED Diagnoses Final diagnoses:  None    Rx / DC Orders ED Discharge Orders     None         Reinaldo Raddle 04/06/23 2333    Laurence Spates, MD 04/07/23 0110

## 2023-04-06 NOTE — ED Triage Notes (Signed)
Severe pain immediately after eating, last over an over. Started yesterday Some nausea

## 2023-04-06 NOTE — Discharge Instructions (Signed)
You were seen in the emergency room today for abdominal pain.  Please follow-up with your GI specialist in regards to today's visit and findings.  Your imaging shows no acute pathology and lab work came back reassuring.  I would recommend bland diet and use Zofran as needed for nausea.  Make sure you are staying well-hydrated.  Please return to emergency room with any new or worsening symptoms.

## 2023-04-09 NOTE — H&P (Signed)
History of Present Illness General:         51/female CT 09/01/22: cirrhosis, portal HTN, splenomegaly, 9mm renal stone Labs on EPIC 09/13/22: NA 134, BUn/Cr/GFR 8/0.63/55/53/82,Hb 10.4, MCv 73.8,PLt 83,BS 439,  EGD 04/2022:grade 1 esophageal varices, portal HTNsive gastropathy, gastric hyperplastic polyp, repeat in 1 year Colon 04/2022, surveillance: 9 mm transverse polyp removed(TA), diverticulosis in sigmoid and transverse, IH,repeat in 5 yrs She c/o intermittent generalized abdominal pain. A couple of days ago, she had one of ther "IBS days", c/o having yellow foam like stools. She has a little bit of BM a day, firm, occasionally she has looser stools associated with urgency and denies blood in stool or black stools. She is on Ozempic for the past 5 weeks and takes metformin 500 mg twice a day. Her BS are running high , stay at 300s fasting, post prandial it can be as high as 369. She takes pantoprazole 40 mg twice a day, denies acid reflux or heartburn, denies difficulty or pain on swallowing, but c/o occasionally has globus sensation. Her weight fluctuates by 4-5 lbs. She is not on sucralfate anymore, but takes dicyclomine every  6 hours, it works for 3 hours and after eating and drinking, the pain comes right back. Zofran 4 mg every 6 hours, helps with nausea, it may not work at times after 30 minutes.  Vital Signs Wt: 225.8, Wt change: 11.6 lbs, Ht: 63, BMI: 39.99, Temp: 97.5, Pulse sitting: 83, BP sitting: 133/83. Examination        GENERAL APPEARANCE: Well developed, overweight, no active distress, pleasant.         SCLERA: anicteric.         CARDIOVASCULAR Normal RRR .         RESPIRATORY Breath sounds normal. Respiration even and unlabored.         ABDOMEN No masses palpated. Liver and spleen not palpated, normal. Bowel sounds normal, Abdomen not distended.         EXTREMITIES: No edema.         NEURO: alert, oriented to time, place and person, normal gait.         PSYCH:  mood/affect normal.   Assessments 1. Generalized abdominal pain - R10.84 (Primary) 2. Other cirrhosis of liver - K74.69 3. History of adenomatous polyp of colon - Z86.010  Treatment 1. Generalized abdominal pain  Notes: CT abdomen is very ressuring, advised patient the same. Her symptoms are compatible with IBS and advised to continue dicyclomine 20 mg every 6 hours as needed. If needed, ok to use tyleonol 500/560 mg 1 tab once a day, not more than 3-4 times a week. Perhaps, try a heating pad. She is on ozempic which may cause or worsen symtpoms.But, it is important for her to get better control of BS, which eventually will help with gastric and colonic motility as wel. She has previously not had any improvement with sucralfate, librax or hyoscamine. Continue pantoprazole 40 mg BID.    2. Other cirrhosis of liver  Notes: Labs and imaging from 3/24 reviewed. Will plan to get CBC, CMP, PT/INR,AFP in 3 months and imaging/USG in 02/2023. No ascites, no encephalopathy, has history of grade 1 EV, severe portal HTN gastropathy.    3. History of adenomatous polyp of colon  Notes: Will be due for surveillance colonoscopy in 04/2027.

## 2023-04-10 ENCOUNTER — Encounter (HOSPITAL_COMMUNITY)
Admission: RE | Disposition: A | Discharge status: Home / Self Care | Payer: Self-pay | Source: Home / Self Care | Attending: Gastroenterology

## 2023-04-10 ENCOUNTER — Ambulatory Visit (HOSPITAL_BASED_OUTPATIENT_CLINIC_OR_DEPARTMENT_OTHER): Payer: Medicaid Other | Admitting: Anesthesiology

## 2023-04-10 ENCOUNTER — Other Ambulatory Visit: Payer: Self-pay

## 2023-04-10 ENCOUNTER — Ambulatory Visit (HOSPITAL_COMMUNITY): Payer: Medicaid Other | Admitting: Anesthesiology

## 2023-04-10 ENCOUNTER — Encounter (HOSPITAL_COMMUNITY): Payer: Self-pay | Admitting: Gastroenterology

## 2023-04-10 ENCOUNTER — Ambulatory Visit (HOSPITAL_COMMUNITY)
Admission: RE | Admit: 2023-04-10 | Discharge: 2023-04-10 | Disposition: A | Discharge status: Home / Self Care | Payer: Medicaid Other | Attending: Gastroenterology | Admitting: Gastroenterology

## 2023-04-10 DIAGNOSIS — K589 Irritable bowel syndrome without diarrhea: Secondary | ICD-10-CM | POA: Diagnosis not present

## 2023-04-10 DIAGNOSIS — K7469 Other cirrhosis of liver: Secondary | ICD-10-CM | POA: Diagnosis not present

## 2023-04-10 DIAGNOSIS — K766 Portal hypertension: Secondary | ICD-10-CM | POA: Insufficient documentation

## 2023-04-10 DIAGNOSIS — Z7985 Long-term (current) use of injectable non-insulin antidiabetic drugs: Secondary | ICD-10-CM | POA: Diagnosis not present

## 2023-04-10 DIAGNOSIS — Z7984 Long term (current) use of oral hypoglycemic drugs: Secondary | ICD-10-CM | POA: Diagnosis not present

## 2023-04-10 DIAGNOSIS — I8500 Esophageal varices without bleeding: Secondary | ICD-10-CM | POA: Diagnosis present

## 2023-04-10 DIAGNOSIS — K259 Gastric ulcer, unspecified as acute or chronic, without hemorrhage or perforation: Secondary | ICD-10-CM | POA: Diagnosis not present

## 2023-04-10 DIAGNOSIS — Z79899 Other long term (current) drug therapy: Secondary | ICD-10-CM | POA: Diagnosis not present

## 2023-04-10 DIAGNOSIS — K3189 Other diseases of stomach and duodenum: Secondary | ICD-10-CM | POA: Diagnosis not present

## 2023-04-10 DIAGNOSIS — Z794 Long term (current) use of insulin: Secondary | ICD-10-CM | POA: Insufficient documentation

## 2023-04-10 DIAGNOSIS — Z09 Encounter for follow-up examination after completed treatment for conditions other than malignant neoplasm: Secondary | ICD-10-CM | POA: Diagnosis not present

## 2023-04-10 DIAGNOSIS — E119 Type 2 diabetes mellitus without complications: Secondary | ICD-10-CM | POA: Insufficient documentation

## 2023-04-10 DIAGNOSIS — Z860101 Personal history of adenomatous and serrated colon polyps: Secondary | ICD-10-CM | POA: Insufficient documentation

## 2023-04-10 DIAGNOSIS — K219 Gastro-esophageal reflux disease without esophagitis: Secondary | ICD-10-CM | POA: Insufficient documentation

## 2023-04-10 DIAGNOSIS — R1084 Generalized abdominal pain: Secondary | ICD-10-CM | POA: Insufficient documentation

## 2023-04-10 HISTORY — PX: ESOPHAGOGASTRODUODENOSCOPY: SHX5428

## 2023-04-10 HISTORY — PX: ESOPHAGEAL BANDING: SHX5518

## 2023-04-10 HISTORY — PX: BIOPSY: SHX5522

## 2023-04-10 LAB — GLUCOSE, CAPILLARY: Glucose-Capillary: 158 mg/dL — ABNORMAL HIGH (ref 70–99)

## 2023-04-10 SURGERY — EGD (ESOPHAGOGASTRODUODENOSCOPY)
Anesthesia: Monitor Anesthesia Care

## 2023-04-10 MED ORDER — GLYCOPYRROLATE 0.2 MG/ML IJ SOLN
INTRAMUSCULAR | Status: DC | PRN
  Administered 2023-04-10: .1 mg via INTRAVENOUS

## 2023-04-10 MED ORDER — SODIUM CHLORIDE 0.9 % IV SOLN: INTRAVENOUS | Status: DC

## 2023-04-10 MED ORDER — PROPOFOL 10 MG/ML IV BOLUS
INTRAVENOUS | Status: DC | PRN
  Administered 2023-04-10 (×2): 60 mg via INTRAVENOUS
  Administered 2023-04-10: 40 mg via INTRAVENOUS
  Administered 2023-04-10: 60 mg via INTRAVENOUS

## 2023-04-10 MED ORDER — KETOROLAC TROMETHAMINE 30 MG/ML IJ SOLN
30.0000 mg | Freq: Once | INTRAMUSCULAR | Status: AC
  Administered 2023-04-10: 30 mg via INTRAVENOUS
  Filled 2023-04-10: qty 1

## 2023-04-10 MED ORDER — PROPOFOL 10 MG/ML IV BOLUS
INTRAVENOUS | Status: AC
  Filled 2023-04-10: qty 20

## 2023-04-10 NOTE — Transfer of Care (Signed)
Immediate Anesthesia Transfer of Care Note  Patient: Javiana L Bynum Applegarth  Procedure(s) Performed: ESOPHAGOGASTRODUODENOSCOPY (EGD) ESOPHAGEAL BANDING BIOPSY  Patient Location: PACU  Anesthesia Type:MAC  Level of Consciousness: awake, alert , and oriented  Airway & Oxygen Therapy: Patient connected to nasal cannula oxygen  Post-op Assessment: Report given to RN  Post vital signs: Reviewed and stable  Last Vitals:  Vitals Value Taken Time  BP    Temp    Pulse 110 04/10/23 1001  Resp 24 04/10/23 1001  SpO2 93 % 04/10/23 1001  Vitals shown include unfiled device data.  Last Pain:  Vitals:   04/10/23 0901  TempSrc: Temporal  PainSc: 0-No pain         Complications: No notable events documented.

## 2023-04-10 NOTE — Interval H&P Note (Signed)
History and Physical Interval Note: 52/female with esophageal varices with cirrhosis, large bleeding gastric hyperplastic polyp removal and abdominal pain for EGD with possible banding with propofol.  04/10/2023 9:28 AM  Rhonda Higgins  has presented today for EGD with possible banding, with the diagnosis of cirrhosis, grade 1 esophageal varices, portal hypertensive gastropathy,.  The various methods of treatment have been discussed with the patient and family. After consideration of risks, benefits and other options for treatment, the patient has consented to  Procedure(s) with comments: ESOPHAGOGASTRODUODENOSCOPY (EGD) (N/A) - with possible banding for esophageal varices as a surgical intervention.  The patient's history has been reviewed, patient examined, no change in status, stable for surgery.  I have reviewed the patient's chart and labs.  Questions were answered to the patient's satisfaction.     Kerin Salen

## 2023-04-10 NOTE — Anesthesia Preprocedure Evaluation (Addendum)
Anesthesia Evaluation  Patient identified by MRN, date of birth, ID band Patient awake    Reviewed: Allergy & Precautions, NPO status , Patient's Chart, lab work & pertinent test results  History of Anesthesia Complications (+) PONV and history of anesthetic complications  Airway Mallampati: II  TM Distance: >3 FB Neck ROM: Full    Dental  (+) Partial Lower, Partial Upper   Pulmonary sleep apnea    Pulmonary exam normal        Cardiovascular negative cardio ROS Normal cardiovascular exam     Neuro/Psych    Depression       GI/Hepatic ,GERD  Medicated,,(+) Cirrhosis   Esophageal Varices      Endo/Other  diabetes, Type 2, Insulin DependentHypothyroidism    Renal/GU negative Renal ROS     Musculoskeletal  (+) Arthritis ,    Abdominal   Peds  Hematology  (+) Blood dyscrasia (Hgb 9.5, Plt 99k), anemia   Anesthesia Other Findings Day of surgery medications reviewed with patient.  Reproductive/Obstetrics                             Anesthesia Physical Anesthesia Plan  ASA: 3  Anesthesia Plan: MAC   Post-op Pain Management: Minimal or no pain anticipated   Induction:   PONV Risk Score and Plan: 3 and Treatment may vary due to age or medical condition and Propofol infusion  Airway Management Planned: Natural Airway and Nasal Cannula  Additional Equipment: None  Intra-op Plan:   Post-operative Plan:   Informed Consent: I have reviewed the patients History and Physical, chart, labs and discussed the procedure including the risks, benefits and alternatives for the proposed anesthesia with the patient or authorized representative who has indicated his/her understanding and acceptance.       Plan Discussed with: CRNA  Anesthesia Plan Comments:        Anesthesia Quick Evaluation

## 2023-04-10 NOTE — Discharge Instructions (Signed)
YOU HAD AN ENDOSCOPIC PROCEDURE TODAY: Refer to the procedure report and other information in the discharge instructions given to you for any specific questions about what was found during the examination. If this information does not answer your questions, please call the [facility] office at [phone] to clarify.   YOU SHOULD EXPECT: Some feelings of bloating in the abdomen. Passage of more gas than usual. Walking can help get rid of the air that was put into your GI tract during the procedure and reduce the bloating.  DIET: Your first meal following the procedure should be a light meal and then it is ok to progress to your normal diet. A half-sandwich or bowl of soup is an example of a good first meal. Heavy or fried foods are harder to digest and may make you feel nauseous or bloated. Drink plenty of fluids but you should avoid alcoholic beverages for 24 hours.   ACTIVITY: Your care partner should take you home directly after the procedure. You should plan to take it easy, moving slowly for the rest of the day. You can resume normal activity the day after the procedure however YOU SHOULD NOT DRIVE, use power tools, machinery or perform tasks that involve climbing or major physical exertion for 24 hours (because of the sedation medicines used during the test).   SYMPTOMS TO REPORT IMMEDIATELY: A gastroenterologist can be reached at any hour. Please call [phone]  for any of the following symptoms:   Following upper endoscopy (EGD, EUS, ERCP, esophageal dilation) Vomiting of blood or coffee ground material  New, significant abdominal pain  New, significant chest pain or pain under the shoulder blades  Painful or persistently difficult swallowing  New shortness of breath  Black, tarry-looking or red, bloody stools  FOLLOW UP:  If any biopsies were taken you will be contacted by phone or by letter within the next 1-3 weeks. Call [phone]  if you have not heard about the biopsies in 3  weeks.  Please also call with any specific questions about appointments or follow up tests. 

## 2023-04-10 NOTE — Anesthesia Procedure Notes (Signed)
Procedure Name: MAC Date/Time: 04/10/2023 9:33 AM  Performed by: Floydene Flock, CRNAPre-anesthesia Checklist: Patient identified, Emergency Drugs available, Suction available, Patient being monitored and Timeout performed Oxygen Delivery Method: Simple face mask

## 2023-04-10 NOTE — Anesthesia Postprocedure Evaluation (Signed)
Anesthesia Post Note  Patient: Rhonda Higgins  Procedure(s) Performed: ESOPHAGOGASTRODUODENOSCOPY (EGD) ESOPHAGEAL BANDING BIOPSY     Patient location during evaluation: PACU Anesthesia Type: MAC Level of consciousness: awake and alert Pain management: pain level controlled Vital Signs Assessment: post-procedure vital signs reviewed and stable Respiratory status: spontaneous breathing, nonlabored ventilation and respiratory function stable Cardiovascular status: blood pressure returned to baseline Postop Assessment: no apparent nausea or vomiting Anesthetic complications: no   No notable events documented.  Last Vitals:  Vitals:   04/10/23 1030 04/10/23 1040  BP: (!) 153/78 (!) 141/80  Pulse: 88 83  Resp: 15 (!) 21  Temp:    SpO2: 97% 99%    Last Pain:  Vitals:   04/10/23 1040  TempSrc:   PainSc: 6                  Shanda Howells

## 2023-04-10 NOTE — Op Note (Signed)
[facility] Patient Name: Rhonda Higgins Procedure Date: 04/10/2023 MRN: [id number] Attending MD: Kerin Salen , MD, [id number] Date of Birth: 27-Oct-1970 CSN: [id number] Age: 52 Admit Type: Outpatient Procedure:                Upper GI endoscopy Indications:              Generalized abdominal pain, For therapy of                            esophageal varices, removal of large bleeding                            hyperplatic gastric polyp Providers:                Kerin Salen, MD, Jacquelyn "Jaci" Clelia Croft, RN, Salley Scarlet, Technician, Yvonne Kendall Parsons,CRNA Referring MD:             Knox Royalty, MD Medicines:                Monitored Anesthesia Care Complications:            No immediate complications. Estimated Blood Loss:     Estimated blood loss: none. Procedure:                Pre-Anesthesia Assessment:                           - Prior to the procedure, a History and Physical                            was performed, and patient medications and                            allergies were reviewed. The patient's tolerance of                            previous anesthesia was also reviewed. The risks                            and benefits of the procedure and the sedation                            options and risks were discussed with the patient.                            All questions were answered, and informed consent                            was obtained. Prior Anticoagulants: The patient has                            taken no anticoagulant or antiplatelet agents. ASA  Grade Assessment: II - A patient with mild systemic                            disease. After reviewing the risks and benefits,                            the patient was deemed in satisfactory condition to                            undergo the procedure.                           After obtaining informed consent, the endoscope was                             passed under direct vision. Throughout the                            procedure, the patient's blood pressure, pulse, and                            oxygen saturations were monitored continuously. The                            [id number]-[id number] ([id number]) Olympus endoscope was introduced                            through the mouth, and advanced to the second part                            of duodenum. The upper GI endoscopy was                            accomplished without difficulty. The patient                            tolerated the procedure well. Scope In: Scope Out: Findings:      Grade II varices were found in the middle third of the esophagus and in       the lower third of the esophagus. They were medium in size. Three bands       were successfully placed with complete eradication, resulting in       deflation of varices. There was no bleeding during and at the end of the       procedure.      Few non-bleeding superficial gastric ulcers with a clean ulcer base       (Forrest Class III) were found in the gastric antrum. The largest lesion       was 5 mm in largest dimension. Biopsies were taken with a cold forceps       for Helicobacter pylori testing.      The cardia and gastric fundus were normal on retroflexion.      Moderate portal hypertensive gastropathy was found in the gastric body.      The examined duodenum was normal. Impression:               -  Grade II esophageal varices. Completely                            eradicated. Banded.                           - Non-bleeding gastric ulcers with a clean ulcer                            base (Forrest Class III). Biopsied.                           - Portal hypertensive gastropathy.                           - Normal examined duodenum. Moderate Sedation:      Patient did not receive moderate sedation for this procedure, but       instead received monitored anesthesia care. Recommendation:           -  Patient has a contact number available for                            emergencies. The signs and symptoms of potential                            delayed complications were discussed with the                            patient. Return to normal activities tomorrow.                            Written discharge instructions were provided to the                            patient.                           - Clear liquid diet for 6 hours.                           - Continue present medications.                           - Await pathology results.                           - Repeat upper endoscopy in 6 months for                            retreatment. Procedure Code(s):        --- Professional ---                           [phone], Esophagogastroduodenoscopy, flexible,                            transoral; with band ligation of esophageal/gastric  varices                           43239, Esophagogastroduodenoscopy, flexible,                            transoral; with biopsy, single or multiple Diagnosis Code(s):        --- Professional ---                           I85.00, Esophageal varices without bleeding                           K25.9, Gastric ulcer, unspecified as acute or                            chronic, without hemorrhage or perforation                           K76.6, Portal hypertension                           K31.89, Other diseases of stomach and duodenum                           R10.84, Generalized abdominal pain CPT copyright 2022 American [employer] Association. All rights reserved. The codes documented in this report are preliminary and upon coder review may  be revised to meet current compliance requirements. Kerin Salen, MD 04/10/2023 10:01:43 AM This report has been signed electronically. Number of Addenda: 0

## 2023-04-10 NOTE — Progress Notes (Addendum)
Pt presented to endo today for EGD with esophageal banding with MD Marca Ancona. Post-procedure, pt c/o 9/10 epigastric/subxiphoid pain. MD Marca Ancona notified. VO for 1 mg dilaudid. MDA Howze notified who requested 0.5 mg dilaudid. Upon review of pt's allergies, pt allergic to hydrocodone. MD Marca Ancona notified of this and changed VO to 30 mg toradol IV push. Toradol given as ordered @ 1020.  Eulas Post, RN 04/10/23 10:20 AM  Addendum 1106: Pt reports interval improvement in pain. Rates pain 6/10 which she states is a manageable level. Pt discharged to home.

## 2023-04-12 LAB — SURGICAL PATHOLOGY

## 2023-04-13 ENCOUNTER — Encounter (HOSPITAL_COMMUNITY): Payer: Self-pay | Admitting: Gastroenterology

## 2023-04-20 ENCOUNTER — Ambulatory Visit: Payer: Medicaid Other | Admitting: Hematology and Oncology

## 2023-04-20 ENCOUNTER — Ambulatory Visit (INDEPENDENT_AMBULATORY_CARE_PROVIDER_SITE_OTHER): Payer: Medicaid Other | Admitting: Orthopedic Surgery

## 2023-04-20 ENCOUNTER — Other Ambulatory Visit (INDEPENDENT_AMBULATORY_CARE_PROVIDER_SITE_OTHER): Payer: Medicaid Other

## 2023-04-20 DIAGNOSIS — Z981 Arthrodesis status: Secondary | ICD-10-CM

## 2023-04-20 NOTE — Progress Notes (Signed)
Orthopedic Surgery Post-operative Office Visit   Procedure: C5-7 ACDF Date of Surgery: 01/26/2023 (~3 months post-op)   Assessment: Patient is a 51 y.o. female who is doing well after surgery     Plan: -Operative plans complete -No spine specific precautions at this time -Pain management: OTC medication -Return to office in 3 months, films needed at next visit: AP/lateral/flex/ex cervical   ___________________________________________________________________________     Subjective: Patient has been doing well since surgery.  She is not having any neck pain.  She has not had any radiating arm pain since surgery.  She has not noticed any redness or drainage around her incision. She is satisfied with her surgical outcome thus far.    Objective:   General: no acute distress, appropriate affect Neurologic: alert, answering questions appropriately, following commands Respiratory: unlabored breathing on room air Skin: incision is well healed   MSK (spine):   -Strength exam                                                   Left                  Right Grip strength                5/5                  5/5 Interosseus                  5/5                  5/5 Wrist extension            5/5                  5/5 Wrist flexion                 5/5                  5/5 Elbow flexion                5/5                  5/5 Deltoid                          5/5                  5/5   -Sensory exam                           Sensation intact to light touch in C5-T1 nerve distributions of bilateral upper extremities     Imaging: XRs of the cervical spine taken 04/20/2023 were independently reviewed and interpreted, showing interbody allografts at C5/6 and C6/7 that appear in appropriate position. There is anterior plate with screws from C5-7 that is in satisfactory position. There is no lucency seen around the screws.  No translation seen on flexion/extension views.  No fracture or dislocation  seen.    Patient name: Rhonda Higgins Patient MRN: [id number] Date of visit: 04/20/23

## 2023-04-23 ENCOUNTER — Inpatient Hospital Stay: Payer: Medicaid Other | Attending: Hematology and Oncology | Admitting: Hematology and Oncology

## 2023-04-23 VITALS — BP 130/60 | HR 71 | Temp 97.5°F | Resp 18 | Ht 63.0 in | Wt 208.6 lb

## 2023-04-23 DIAGNOSIS — D696 Thrombocytopenia, unspecified: Secondary | ICD-10-CM | POA: Insufficient documentation

## 2023-04-23 DIAGNOSIS — K766 Portal hypertension: Secondary | ICD-10-CM | POA: Insufficient documentation

## 2023-04-23 DIAGNOSIS — K7460 Unspecified cirrhosis of liver: Secondary | ICD-10-CM | POA: Diagnosis not present

## 2023-04-23 DIAGNOSIS — D509 Iron deficiency anemia, unspecified: Secondary | ICD-10-CM | POA: Diagnosis not present

## 2023-04-23 NOTE — Assessment & Plan Note (Signed)
Lab review: 07/19/2014: Platelets 134 11/14/2017: Platelets 103 03/31/2020: Platelets 109 04/18/2021: Platelets 81 08/27/2021: Platelets 79 09/09/2021: Platelets 82, B12 246, immature platelet fraction: 2.9% The low immature platelet fraction rules out ITP. Mild thrombocytopenia: Most likely related to cirrhosis of the liver. 03/17/22: Platelet count 85    04/06/2023: Platelets 99, hemoglobin 9.5, MCV 68.5   Slightly low B12 levels: Encouraged her to take 1000 mcg of sublingual B12   Ultrasound abdomen 04/19/2021: Cirrhosis of liver Recommend to check iron levels given microcytic anemia. Follow-up by telephone to discuss results of iron studies Return to clinic in 1 with labs and follow-up

## 2023-04-23 NOTE — Progress Notes (Signed)
Patient Care Team: Knox Royalty, MD as PCP - General (Family Medicine)  DIAGNOSIS:  Encounter Diagnosis  Name Primary?   Thrombocytopenia (HCC) Yes   CHIEF COMPLIANT: Follow-up of anemia and thrombocytopenia  HISTORY OF PRESENT ILLNESS:   History of Present Illness   The patient, with a history of cirrhosis and thrombocytopenia, presents for follow-up. She reports no new symptoms or changes in health. Recent blood work showed improving platelet counts, now at 99, and a decreasing hemoglobin, now at 9.5. The patient denies any blood in her stool and reports a history of stomach ulcers, one of which had previously bled. She also reports a recent endoscopy during which some veins were banded due to her cirrhosis. The patient has been feeling increasingly tired and has developed a craving for ice chips.         ALLERGIES:  is allergic to hydrocodone, tape, wound dressing adhesive, pholcodine, and codeine.  MEDICATIONS:  Current Outpatient Medications  Medication Sig Dispense Refill   ACCRUFER 30 MG CAPS Take 1 capsule by mouth 2 (two) times daily.     ACCU-CHEK GUIDE test strip      Accu-Chek Softclix Lancets lancets daily.     atorvastatin (LIPITOR) 40 MG tablet Take 40 mg by mouth daily.     buPROPion (WELLBUTRIN XL) 300 MG 24 hr tablet Take 300 mg by mouth daily.     cetirizine (ZYRTEC) 10 MG tablet Take 10 mg by mouth daily as needed for allergies.     Cyanocobalamin (B-12 PO) Take 1 tablet by mouth daily.     dicyclomine (BENTYL) 20 MG tablet Take 20 mg by mouth 4 (four) times daily as needed for spasms.     gabapentin (NEURONTIN) 400 MG capsule Take 400-1,200 mg by mouth See admin instructions. Takes 400 mg in the morning 400 mg in the afternoon and 1200 mg at night     insulin glargine (LANTUS) 100 UNIT/ML injection Inject 20 Units into the skin at bedtime.     lisinopril (PRINIVIL,ZESTRIL) 5 MG tablet Take 5 mg by mouth daily.     metFORMIN (GLUCOPHAGE-XR) 500 MG 24 hr  tablet Take 500 mg by mouth 2 (two) times daily.     methocarbamol (ROBAXIN) 500 MG tablet Take 1 tablet (500 mg total) by mouth every 8 (eight) hours as needed for muscle spasms. (Patient not taking: Reported on 01/10/2023) 15 tablet 0   ondansetron (ZOFRAN-ODT) 4 MG disintegrating tablet Take 4 mg by mouth every 6 (six) hours as needed for vomiting or nausea.     OZEMPIC, 0.25 OR 0.5 MG/DOSE, 2 MG/3ML SOPN Inject 0.5 mg into the skin every Sunday.     pantoprazole (PROTONIX) 40 MG tablet Take 40 mg by mouth 2 (two) times daily.   0   Peppermint Oil (IBGARD) 90 MG CPCR Take 2 tablets by mouth daily as needed (IBS).     risperiDONE (RISPERDAL) 2 MG tablet Take 2 mg by mouth at bedtime.   2   SYNTHROID 175 MCG tablet Take 175 mcg by mouth daily.     traZODone (DESYREL) 50 MG tablet Take 1 tablet (50 mg total) by mouth at bedtime. (Patient taking differently: Take 50 mg by mouth at bedtime as needed for sleep.)     Vitamin D, Ergocalciferol, (DRISDOL) 1.25 MG (50000 UNIT) CAPS capsule Take 50,000 Units by mouth once a week. Sunday     No current facility-administered medications for this visit.    PHYSICAL EXAMINATION: ECOG PERFORMANCE STATUS: 1 -  Symptomatic but completely ambulatory  Vitals:   04/23/23 1053  BP: 130/60  Pulse: 71  Resp: 18  Temp: (!) 97.5 F (36.4 C)  SpO2: 99%   Filed Weights   04/23/23 1053  Weight: 208 lb 9.6 oz (94.6 kg)     LABORATORY DATA:  I have reviewed the data as listed    Latest Ref Rng & Units 04/06/2023    2:23 PM 01/27/2023    2:46 AM 01/17/2023   10:38 AM  CMP  Glucose 70 - 99 mg/dL 161  096  88   BUN 6 - 20 mg/dL 6  11  9    Creatinine 0.44 - 1.00 mg/dL 0.45  4.09  8.11   Sodium 135 - 145 mmol/L 140  133  140   Potassium 3.5 - 5.1 mmol/L 3.5  3.9  3.7   Chloride 98 - 111 mmol/L 104  101  104   CO2 22 - 32 mmol/L 29  24  23    Calcium 8.9 - 10.3 mg/dL 8.3  7.9  8.5   Total Protein 6.5 - 8.1 g/dL 6.3   6.9   Total Bilirubin 0.3 - 1.2 mg/dL  0.8   1.3   Alkaline Phos 38 - 126 U/L 81   63   AST 15 - 41 U/L 18   42   ALT 0 - 44 U/L 15   36     Lab Results  Component Value Date   WBC 3.5 (L) 04/06/2023   HGB 9.5 (L) 04/06/2023   HCT 33.9 (L) 04/06/2023   MCV 68.5 (L) 04/06/2023   PLT 99 (L) 04/06/2023   NEUTROABS 2.6 07/31/2022    ASSESSMENT & PLAN:  Thrombocytopenia (HCC) Lab review: 07/19/2014: Platelets 134 11/14/2017: Platelets 103 03/31/2020: Platelets 109 04/18/2021: Platelets 81 08/27/2021: Platelets 79 09/09/2021: Platelets 82, B12 246, immature platelet fraction: 2.9% The low immature platelet fraction rules out ITP. Mild thrombocytopenia: Most likely related to cirrhosis of the liver. 03/17/22: Platelet count 85    04/06/2023: Platelets 99, hemoglobin 9.5, MCV 68.5   Slightly low B12 levels: Encouraged her to take 1000 mcg of sublingual B12   Ultrasound abdomen 04/19/2021: Cirrhosis of liver Recommend to check iron levels given microcytic anemia. Follow-up by telephone to discuss results of iron studies Return to clinic in 1 with labs and follow-up ------------------------------------- Assessment and Plan    Iron Deficiency Anemia Decreasing hemoglobin and MCV suggestive of iron deficiency anemia. No overt GI bleeding reported. History of gastric ulcers and recent endoscopic banding. Likely multifactorial including dietary and possible occult GI bleeding. -Administer intravenous iron (Renova 300mg ) in three doses over the next three weeks. -Check labs in four months to assess response to iron therapy.  Thrombocytopenia Gradual improvement in platelet count over the past year, currently at 99. -Continue current management and monitor.  Cirrhosis with Portal Hypertension History of cirrhosis leading to portal hypertension and esophageal varices requiring banding. -Continue current management and monitor.     Recheck labs in 4 months  Orders Placed This Encounter  Procedures   CBC with Differential  ([facility] Center Only)    Standing Status:   Future    Standing Expiration Date:   04/22/2024   Ferritin    Standing Status:   Future    Standing Expiration Date:   04/22/2024   Iron and Iron Binding Capacity ([facility] only)    Standing Status:   Future    Standing Expiration Date:   04/22/2024   The patient has  a good understanding of the overall plan. she agrees with it. she will call with any problems that may develop before the next visit here. Total time spent: 30 mins including face to face time and time spent for planning, charting and co-ordination of care   Tamsen Meek, MD 04/23/23

## 2023-04-25 ENCOUNTER — Ambulatory Visit: Payer: Medicaid Other | Admitting: Dermatology

## 2023-05-09 ENCOUNTER — Inpatient Hospital Stay: Payer: Medicaid Other

## 2023-05-09 VITALS — BP 126/69 | HR 62 | Temp 98.8°F | Resp 13

## 2023-05-09 DIAGNOSIS — D500 Iron deficiency anemia secondary to blood loss (chronic): Secondary | ICD-10-CM

## 2023-05-09 DIAGNOSIS — D696 Thrombocytopenia, unspecified: Secondary | ICD-10-CM | POA: Diagnosis not present

## 2023-05-09 MED ORDER — SODIUM CHLORIDE 0.9 % IV SOLN: INTRAVENOUS | Status: DC

## 2023-05-09 MED ORDER — IRON SUCROSE 20 MG/ML IV SOLN
300.0000 mg | Freq: Once | INTRAVENOUS | Status: AC
  Administered 2023-05-09: 300 mg via INTRAVENOUS
  Filled 2023-05-09: qty 300

## 2023-05-09 NOTE — Progress Notes (Signed)
Patient observed for 30 min following iron infusion.  Tolerated treatment well without incident.  VSS at discharge.  Ambulated to lobby.

## 2023-05-09 NOTE — Patient Instructions (Signed)
Iron Sucrose Injection What is this medication? IRON SUCROSE (EYE ern SOO krose) treats low levels of iron (iron deficiency anemia) in people with kidney disease. Iron is a mineral that plays an important role in making red blood cells, which carry oxygen from your lungs to the rest of your body. This medicine may be used for other purposes; ask your health care provider or pharmacist if you have questions. COMMON BRAND NAME(S): Venofer What should I tell my care team before I take this medication? They need to know if you have any of these conditions: Anemia not caused by low iron levels Heart disease High levels of iron in the blood Kidney disease Liver disease An unusual or allergic reaction to iron, other medications, foods, dyes, or preservatives Pregnant or trying to get pregnant Breastfeeding How should I use this medication? This medication is for infusion into a vein. It is given in a hospital or clinic setting. Talk to your care team about the use of this medication in children. While this medication may be prescribed for children as young as 2 years for selected conditions, precautions do apply. Overdosage: If you think you have taken too much of this medicine contact a poison control center or emergency room at once. NOTE: This medicine is only for you. Do not share this medicine with others. What if I miss a dose? Keep appointments for follow-up doses. It is important not to miss your dose. Call your care team if you are unable to keep an appointment. What may interact with this medication? Do not take this medication with any of the following: Deferoxamine Dimercaprol Other iron products This medication may also interact with the following: Chloramphenicol Deferasirox This list may not describe all possible interactions. Give your health care provider a list of all the medicines, herbs, non-prescription drugs, or dietary supplements you use. Also tell them if you smoke,  drink alcohol, or use illegal drugs. Some items may interact with your medicine. What should I watch for while using this medication? Visit your care team regularly. Tell your care team if your symptoms do not start to get better or if they get worse. You may need blood work done while you are taking this medication. You may need to follow a special diet. Talk to your care team. Foods that contain iron include: whole grains/cereals, dried fruits, beans, or peas, leafy green vegetables, and organ meats (liver, kidney). What side effects may I notice from receiving this medication? Side effects that you should report to your care team as soon as possible: Allergic reactions--skin rash, itching, hives, swelling of the face, lips, tongue, or throat Low blood pressure--dizziness, feeling faint or lightheaded, blurry vision Shortness of breath Side effects that usually do not require medical attention (report to your care team if they continue or are bothersome): Flushing Headache Joint pain Muscle pain Nausea Pain, redness, or irritation at injection site This list may not describe all possible side effects. Call your doctor for medical advice about side effects. You may report side effects to FDA at 1-800-FDA-1088. Where should I keep my medication? This medication is given in a hospital or clinic. It will not be stored at home. NOTE: This sheet is a summary. It may not cover all possible information. If you have questions about this medicine, talk to your doctor, pharmacist, or health care provider.  2024 Elsevier/Gold Standard (2022-11-10 00:00:00)

## 2023-05-15 ENCOUNTER — Telehealth: Payer: Self-pay | Admitting: Orthopedic Surgery

## 2023-05-15 MED ORDER — TRAMADOL HCL 50 MG PO TABS: 50.0000 mg | ORAL_TABLET | Freq: Four times a day (QID) | ORAL | 0 refills | Status: AC | PRN

## 2023-05-15 NOTE — Telephone Encounter (Signed)
Patient called. Says she is having pain going down her R arm again. Her cb# [phone]

## 2023-05-15 NOTE — Addendum Note (Signed)
Addended by: Willia Craze on: 05/15/2023 05:21 PM   Modules accepted: Orders

## 2023-05-19 ENCOUNTER — Inpatient Hospital Stay: Payer: Medicaid Other

## 2023-05-19 VITALS — BP 125/72 | HR 62 | Temp 98.6°F | Resp 16

## 2023-05-19 DIAGNOSIS — D500 Iron deficiency anemia secondary to blood loss (chronic): Secondary | ICD-10-CM

## 2023-05-19 DIAGNOSIS — D696 Thrombocytopenia, unspecified: Secondary | ICD-10-CM

## 2023-05-19 MED ORDER — SODIUM CHLORIDE 0.9 % IV SOLN: INTRAVENOUS | Status: DC

## 2023-05-19 MED ORDER — SODIUM CHLORIDE 0.9 % IV SOLN
300.0000 mg | Freq: Once | INTRAVENOUS | Status: AC
  Administered 2023-05-19: 300 mg via INTRAVENOUS
  Filled 2023-05-19: qty 10

## 2023-05-19 NOTE — Progress Notes (Signed)
Patient tolerated her iron last infusion- declined the 30 minute observation- was encouraged to stay/given the option to stay. VSS-  BP 125/72 (BP Location: Right Arm, Patient Position: Sitting)   Pulse 62   Temp 98.6 F (37 C) (Oral)   Resp 16   SpO2 98%   No signs of any reaction. Ambulatory to the lobby. Wife to pick up.

## 2023-05-21 ENCOUNTER — Telehealth: Payer: Self-pay | Admitting: Orthopedic Surgery

## 2023-05-21 NOTE — Telephone Encounter (Signed)
Pt called requesting a call back. Pt states her arm is still hurting and medication is not working and need medical advice. Please call pt at [phone].

## 2023-05-22 NOTE — Telephone Encounter (Signed)
 Scheduled for 05/23/23 @ 2pm

## 2023-05-23 ENCOUNTER — Other Ambulatory Visit (INDEPENDENT_AMBULATORY_CARE_PROVIDER_SITE_OTHER): Payer: Medicaid Other

## 2023-05-23 ENCOUNTER — Ambulatory Visit: Payer: Medicaid Other | Admitting: Orthopedic Surgery

## 2023-05-23 DIAGNOSIS — Z981 Arthrodesis status: Secondary | ICD-10-CM

## 2023-05-23 DIAGNOSIS — M5412 Radiculopathy, cervical region: Secondary | ICD-10-CM | POA: Diagnosis not present

## 2023-05-23 NOTE — Progress Notes (Signed)
Orthopedic Surgery Post-operative Office Visit   Procedure: C5-7 ACDF Date of Surgery: 01/26/2023 (~3 months post-op)   Assessment: Patient is a 52 y.o. female who had done well after surgery but has now developed new pain radiating into her right upper extremity     Plan: -I went over the non-operative treatments we can try: PT, tylenol, NSAIDs, lyrica/gabapentin, medrol dose pak, steroid injection. Since her pain is worse with motion, she elected to hold off on PT. She is going to take tylenol. She said she cannot use NSAIDs and cannot use a medrol dose pak due to her blood sugars. She is already taking gabapentin. So, we are going to try a cervical injection -She did not find tramadol helpful so I told her to stop using it -If she is not doing any better at our next visit, will order MRI to evaluate further -No spine specific precautions -Return to office in 5 weeks, films needed at next visit: none   ___________________________________________________________________________     Subjective: Patient had been doing well after surgery. She had resolution of her arm pain. She has now developed pain radiating into her lateral arm, into her dorsal forearm, and into all her fingers. She does not have any pain in her left upper extremity. There was not trauma or injury that preceded the onset of pain. She feels this is different than her pain prior to surgery. She has had this pain for about 2 weeks now. She has been trying tramadol which did not give her any significant relief.     Objective:   General: no acute distress, appropriate affect Neurologic: alert, answering questions appropriately, following commands Respiratory: unlabored breathing on room air Skin: incision is well healed   MSK (spine):   -Strength exam                                                   Left                  Right Grip strength                5/5                  5/5 Interosseus                  5/5                   5/5 Wrist extension            5/5                  5/5 Wrist flexion                 5/5                  5/5 Elbow flexion                5/5                  5/5 Deltoid                          5/5                  5/5   -Sensory exam  Sensation intact to light touch in C5-T1 nerve distributions of bilateral upper extremities     Imaging: XRs of the cervical spine taken 05/23/2023 were independently reviewed and interpreted, showing interbody allografts at C5/6 and C6/7 that appear in appropriate position. There is anterior plate with screws from C5-7. No lucency seen around the screws. No spondylolisthesis seen on flexion/extension views. No fracture or dislocation seen.      Patient name: Rhonda Higgins [place] Patient MRN: [id number] Date of visit: 05/23/23

## 2023-05-25 ENCOUNTER — Telehealth: Payer: Self-pay | Admitting: Orthopedic Surgery

## 2023-05-25 MED ORDER — OXYCODONE HCL 5 MG PO TABS: 2.5000 mg | ORAL_TABLET | Freq: Four times a day (QID) | ORAL | 0 refills | Status: AC | PRN

## 2023-05-25 MED FILL — Iron Sucrose Inj 20 MG/ML (Fe Equiv): INTRAVENOUS | Qty: 15 | Status: AC

## 2023-05-25 NOTE — Telephone Encounter (Signed)
Patient called needing something called in for pain. Patient said the extra strengthTylenol is not working. The number to contact patient is [phone]

## 2023-05-26 ENCOUNTER — Inpatient Hospital Stay: Payer: Medicaid Other | Attending: Internal Medicine

## 2023-05-26 ENCOUNTER — Other Ambulatory Visit: Payer: Self-pay | Admitting: *Deleted

## 2023-05-26 VITALS — BP 121/69 | HR 79 | Temp 98.8°F | Resp 17

## 2023-05-26 DIAGNOSIS — D500 Iron deficiency anemia secondary to blood loss (chronic): Secondary | ICD-10-CM

## 2023-05-26 DIAGNOSIS — D509 Iron deficiency anemia, unspecified: Secondary | ICD-10-CM | POA: Diagnosis present

## 2023-05-26 DIAGNOSIS — D696 Thrombocytopenia, unspecified: Secondary | ICD-10-CM

## 2023-05-26 MED ORDER — SODIUM CHLORIDE 0.9 % IV SOLN
INTRAVENOUS | Status: DC
Start: 2023-05-26 — End: 2023-05-26

## 2023-05-26 MED ORDER — SODIUM CHLORIDE 0.9 % IV SOLN
300.0000 mg | Freq: Once | INTRAVENOUS | Status: AC
  Administered 2023-05-26: 300 mg via INTRAVENOUS
  Filled 2023-05-26: qty 300

## 2023-05-26 NOTE — Patient Instructions (Signed)
Iron Sucrose Injection What is this medication? IRON SUCROSE (EYE ern SOO krose) treats low levels of iron (iron deficiency anemia) in people with kidney disease. Iron is a mineral that plays an important role in making red blood cells, which carry oxygen from your lungs to the rest of your body. This medicine may be used for other purposes; ask your health care provider or pharmacist if you have questions. COMMON BRAND NAME(S): Venofer What should I tell my care team before I take this medication? They need to know if you have any of these conditions: Anemia not caused by low iron levels Heart disease High levels of iron in the blood Kidney disease Liver disease An unusual or allergic reaction to iron, other medications, foods, dyes, or preservatives Pregnant or trying to get pregnant Breastfeeding How should I use this medication? This medication is for infusion into a vein. It is given in a hospital or clinic setting. Talk to your care team about the use of this medication in children. While this medication may be prescribed for children as young as 2 years for selected conditions, precautions do apply. Overdosage: If you think you have taken too much of this medicine contact a poison control center or emergency room at once. NOTE: This medicine is only for you. Do not share this medicine with others. What if I miss a dose? Keep appointments for follow-up doses. It is important not to miss your dose. Call your care team if you are unable to keep an appointment. What may interact with this medication? Do not take this medication with any of the following: Deferoxamine Dimercaprol Other iron products This medication may also interact with the following: Chloramphenicol Deferasirox This list may not describe all possible interactions. Give your health care provider a list of all the medicines, herbs, non-prescription drugs, or dietary supplements you use. Also tell them if you smoke,  drink alcohol, or use illegal drugs. Some items may interact with your medicine. What should I watch for while using this medication? Visit your care team regularly. Tell your care team if your symptoms do not start to get better or if they get worse. You may need blood work done while you are taking this medication. You may need to follow a special diet. Talk to your care team. Foods that contain iron include: whole grains/cereals, dried fruits, beans, or peas, leafy green vegetables, and organ meats (liver, kidney). What side effects may I notice from receiving this medication? Side effects that you should report to your care team as soon as possible: Allergic reactions--skin rash, itching, hives, swelling of the face, lips, tongue, or throat Low blood pressure--dizziness, feeling faint or lightheaded, blurry vision Shortness of breath Side effects that usually do not require medical attention (report to your care team if they continue or are bothersome): Flushing Headache Joint pain Muscle pain Nausea Pain, redness, or irritation at injection site This list may not describe all possible side effects. Call your doctor for medical advice about side effects. You may report side effects to FDA at 1-800-FDA-1088. Where should I keep my medication? This medication is given in a hospital or clinic. It will not be stored at home. NOTE: This sheet is a summary. It may not cover all possible information. If you have questions about this medicine, talk to your doctor, pharmacist, or health care provider.  2024 Elsevier/Gold Standard (2022-11-10 00:00:00)

## 2023-05-26 NOTE — Progress Notes (Signed)
Pt declined to stay post 30 min obs, discharged with VSS, ambulatory to lobby.

## 2023-06-06 ENCOUNTER — Telehealth: Payer: Self-pay | Admitting: Orthopedic Surgery

## 2023-06-06 MED ORDER — OXYCODONE HCL 5 MG PO TABS: 2.5000 mg | ORAL_TABLET | Freq: Four times a day (QID) | ORAL | 0 refills | Status: AC | PRN

## 2023-06-06 NOTE — Telephone Encounter (Signed)
Pt called requesting a refill of pain medication. Please send to [facility] on [address]. Pt phone number is [phone].

## 2023-06-11 ENCOUNTER — Other Ambulatory Visit: Payer: Self-pay

## 2023-06-11 ENCOUNTER — Ambulatory Visit: Payer: Medicaid Other | Admitting: Physical Medicine and Rehabilitation

## 2023-06-11 DIAGNOSIS — M5412 Radiculopathy, cervical region: Secondary | ICD-10-CM | POA: Diagnosis not present

## 2023-06-11 DIAGNOSIS — M961 Postlaminectomy syndrome, not elsewhere classified: Secondary | ICD-10-CM

## 2023-06-11 MED ORDER — METHYLPREDNISOLONE ACETATE 40 MG/ML IJ SUSP
40.0000 mg | Freq: Once | INTRAMUSCULAR | Status: AC
  Administered 2023-06-11: 40 mg

## 2023-06-11 NOTE — Patient Instructions (Signed)

## 2023-06-11 NOTE — Progress Notes (Signed)
KELISE KOPERA Applegarth - 52 y.o. female MRN [id number]  Date of birth: 03/11/1971  Office Visit Note: Visit Date: 06/11/2023 PCP: Knox Royalty, MD Referred by: London Sheer, MD  Subjective: Chief Complaint  Patient presents with   Neck - Pain   HPI:  Benard Rink Applegarth is a 52 y.o. female who comes in today at the request of Dr. Willia Craze for planned Right C7-T1 Cervical Interlaminar epidural steroid injection with fluoroscopic guidance.  The patient has failed conservative care including home exercise, medications, time and activity modification.  This injection will be diagnostic and hopefully therapeutic.  Please see requesting physician notes for further details and justification.    ROS Otherwise per HPI.  Assessment & Plan: Visit Diagnoses:    ICD-10-CM   1. Cervical radiculopathy  M54.12 methylPREDNISolone acetate (DEPO-MEDROL) injection 40 mg    XR C-ARM NO REPORT    Epidural Steroid injection    2. Post laminectomy syndrome  M96.1       Plan: No additional findings.   Meds & Orders:  Meds ordered this encounter  Medications   methylPREDNISolone acetate (DEPO-MEDROL) injection 40 mg    Orders Placed This Encounter  Procedures   XR C-ARM NO REPORT   Epidural Steroid injection    Follow-up: Return for visit to requesting provider as needed.   Procedures: No procedures performed  Cervical Epidural Steroid Injection - Interlaminar Approach with Fluoroscopic Guidance  Patient: JAQUELA SHAKER Applegarth      Date of Birth: 1970-09-27 MRN: [id number] PCP: Knox Royalty, MD      Visit Date: 06/11/2023   Universal Protocol:    Date/Time: 12/23/243:06 PM  Consent Given By: the patient  Position: PRONE  Additional Comments: Vital signs were monitored before and after the procedure. Patient was prepped and draped in the usual sterile fashion. The correct patient, procedure, and site was verified.   Injection Procedure Details:    Procedure diagnoses: Cervical radiculopathy [M54.12]    Meds Administered:  Meds ordered this encounter  Medications   methylPREDNISolone acetate (DEPO-MEDROL) injection 40 mg     Laterality: Right  Location/Site: C7-T1  Needle: 3.5 in., 20 ga. Tuohy  Needle Placement: Paramedian epidural space  Findings:  -Comments: Excellent flow of contrast into the epidural space.  Procedure Details: Using a paramedian approach from the side mentioned above, the region overlying the inferior lamina was localized under fluoroscopic visualization and the soft tissues overlying this structure were infiltrated with 4 ml. of 1% Lidocaine without Epinephrine. A # 20 gauge, Tuohy needle was inserted into the epidural space using a paramedian approach.  The epidural space was localized using loss of resistance along with contralateral oblique bi-planar fluoroscopic views.  After negative aspirate for air, blood, and CSF, a 2 ml. volume of Isovue-250 was injected into the epidural space and the flow of contrast was observed. Radiographs were obtained for documentation purposes.   The injectate was administered into the level noted above.  Additional Comments:  The patient tolerated the procedure well Dressing: 2 x 2 sterile gauze and Band-Aid    Post-procedure details: Patient was observed during the procedure. Post-procedure instructions were reviewed.  Patient left the clinic in stable condition.   Clinical History: Narrative & Impression CLINICAL DATA:  evaluation persistant right radicular pain   EXAM: MRI CERVICAL SPINE WITHOUT CONTRAST   TECHNIQUE: Multiplanar, multisequence MR imaging of the cervical spine was performed. No intravenous contrast was administered.   COMPARISON:  None Available.  FINDINGS: Alignment: Straightening.  No substantial sagittal subluxation.   Vertebrae: No fracture, evidence of discitis, or bone lesion.   Cord: Normal cord signal.   Posterior  Fossa, vertebral arteries, paraspinal tissues: Visualized vertebral artery flow voids are maintained. No evidence of acute abnormality in the visualized posterior fossa.   Disc levels:   C2-C3: No significant disc protrusion, foraminal stenosis, or canal stenosis.   C3-C4: No significant disc protrusion, foraminal stenosis, or canal stenosis.   C4-C5: Left eccentric posterior disc osteophyte complex with left greater than right facet and uncovertebral hypertrophy. Resulting mild to moderate left foraminal stenosis and mild canal stenosis. Patent right foramen.   C5-C6: Posterior disc osteophyte complex with bilateral facet and uncovertebral hypertrophy. Resulting mild left greater than right foraminal stenosis. Mild canal stenosis.   C6-C7: Posterior disc osteophyte complex with severe bilateral facet uncovertebral hypertrophy. Resulting severe bilateral foraminal stenosis and moderate canal stenosis.   C7-T1: No significant disc protrusion, foraminal stenosis, or canal stenosis.   IMPRESSION: 1. At C6-C7, severe bilateral foraminal stenosis and moderate canal stenosis. 2. At C4-C5, mild to moderate left foraminal stenosis and mild canal stenosis. 3. At C5-C6, mild bilateral foraminal stenosis and mild canal stenosis.     Electronically Signed   By: Feliberto Harts M.D.   On: 11/20/2022 13:31     Objective:  VS:  HT:    WT:   BMI:     BP:   HR: bpm  TEMP: ( )  RESP:  Physical Exam Vitals and nursing note reviewed.  Constitutional:      General: She is not in acute distress.    Appearance: Normal appearance. She is not ill-appearing.  HENT:     Head: Normocephalic and atraumatic.     Right Ear: External ear normal.     Left Ear: External ear normal.  Eyes:     Extraocular Movements: Extraocular movements intact.  Cardiovascular:     Rate and Rhythm: Normal rate.     Pulses: Normal pulses.  Musculoskeletal:     Cervical back: Tenderness present. No  rigidity.     Right lower leg: No edema.     Left lower leg: No edema.     Comments: Patient has good strength in the upper extremities including 5 out of 5 strength in wrist extension long finger flexion and APB.  There is no atrophy of the hands intrinsically.  There is a negative Hoffmann's test.   Lymphadenopathy:     Cervical: No cervical adenopathy.  Skin:    Findings: No erythema, lesion or rash.  Neurological:     General: No focal deficit present.     Mental Status: She is alert and oriented to person, place, and time.     Sensory: No sensory deficit.     Motor: No weakness or abnormal muscle tone.     Coordination: Coordination normal.  Psychiatric:        Mood and Affect: Mood normal.        Behavior: Behavior normal.      Imaging: XR C-ARM NO REPORT Result Date: 06/11/2023 Please see Notes tab for imaging impression.

## 2023-06-11 NOTE — Procedures (Signed)
Cervical Epidural Steroid Injection - Interlaminar Approach with Fluoroscopic Guidance  Patient: Rhonda Higgins      Date of Birth: [birth year] MRN: [id number] PCP: Knox Royalty, MD      Visit Date: 06/11/2023   Universal Protocol:    Date/Time: 12/23/243:06 PM  Consent Given By: the patient  Position: PRONE  Additional Comments: Vital signs were monitored before and after the procedure. Patient was prepped and draped in the usual sterile fashion. The correct patient, procedure, and site was verified.   Injection Procedure Details:   Procedure diagnoses: Cervical radiculopathy [M54.12]    Meds Administered:  Meds ordered this encounter  Medications   methylPREDNISolone acetate (DEPO-MEDROL) injection 40 mg     Laterality: Right  Location/Site: C7-T1  Needle: 3.5 in., 20 ga. Tuohy  Needle Placement: Paramedian epidural space  Findings:  -Comments: Excellent flow of contrast into the epidural space.  Procedure Details: Using a paramedian approach from the side mentioned above, the region overlying the inferior lamina was localized under fluoroscopic visualization and the soft tissues overlying this structure were infiltrated with 4 ml. of 1% Lidocaine without Epinephrine. A # 20 gauge, Tuohy needle was inserted into the epidural space using a paramedian approach.  The epidural space was localized using loss of resistance along with contralateral oblique bi-planar fluoroscopic views.  After negative aspirate for air, blood, and CSF, a 2 ml. volume of Isovue-250 was injected into the epidural space and the flow of contrast was observed. Radiographs were obtained for documentation purposes.   The injectate was administered into the level noted above.  Additional Comments:  The patient tolerated the procedure well Dressing: 2 x 2 sterile gauze and Band-Aid    Post-procedure details: Patient was observed during the procedure. Post-procedure instructions were  reviewed.  Patient left the clinic in stable condition.

## 2023-06-24 ENCOUNTER — Encounter (HOSPITAL_COMMUNITY): Payer: Self-pay

## 2023-06-24 ENCOUNTER — Other Ambulatory Visit: Payer: Self-pay

## 2023-06-24 ENCOUNTER — Emergency Department (HOSPITAL_COMMUNITY): Payer: Medicaid Other

## 2023-06-24 ENCOUNTER — Emergency Department (HOSPITAL_COMMUNITY)
Admission: EM | Admit: 2023-06-24 | Discharge: 2023-06-24 | Disposition: A | Discharge status: Home / Self Care | Payer: Medicaid Other | Attending: Emergency Medicine | Admitting: Emergency Medicine

## 2023-06-24 DIAGNOSIS — R350 Frequency of micturition: Secondary | ICD-10-CM | POA: Diagnosis not present

## 2023-06-24 DIAGNOSIS — R110 Nausea: Secondary | ICD-10-CM | POA: Insufficient documentation

## 2023-06-24 DIAGNOSIS — R109 Unspecified abdominal pain: Secondary | ICD-10-CM

## 2023-06-24 DIAGNOSIS — R1013 Epigastric pain: Secondary | ICD-10-CM | POA: Insufficient documentation

## 2023-06-24 DIAGNOSIS — R1012 Left upper quadrant pain: Secondary | ICD-10-CM | POA: Insufficient documentation

## 2023-06-24 DIAGNOSIS — Z794 Long term (current) use of insulin: Secondary | ICD-10-CM | POA: Diagnosis not present

## 2023-06-24 DIAGNOSIS — N200 Calculus of kidney: Secondary | ICD-10-CM

## 2023-06-24 LAB — CBC
HCT: 38 % (ref 36.0–46.0)
Hemoglobin: 11.8 g/dL — ABNORMAL LOW (ref 12.0–15.0)
MCH: 24.9 pg — ABNORMAL LOW (ref 26.0–34.0)
MCHC: 31.1 g/dL (ref 30.0–36.0)
MCV: 80.2 fL (ref 80.0–100.0)
Platelets: 80 10*3/uL — ABNORMAL LOW (ref 150–400)
RBC: 4.74 MIL/uL (ref 3.87–5.11)
RDW: 24.6 % — ABNORMAL HIGH (ref 11.5–15.5)
WBC: 3.1 10*3/uL — ABNORMAL LOW (ref 4.0–10.5)
nRBC: 0 % (ref 0.0–0.2)

## 2023-06-24 LAB — COMPREHENSIVE METABOLIC PANEL
ALT: 39 U/L (ref 0–44)
AST: 35 U/L (ref 15–41)
Albumin: 3.6 g/dL (ref 3.5–5.0)
Alkaline Phosphatase: 75 U/L (ref 38–126)
Anion gap: 9 (ref 5–15)
BUN: 7 mg/dL (ref 6–20)
CO2: 25 mmol/L (ref 22–32)
Calcium: 8.5 mg/dL — ABNORMAL LOW (ref 8.9–10.3)
Chloride: 106 mmol/L (ref 98–111)
Creatinine, Ser: 0.53 mg/dL (ref 0.44–1.00)
GFR, Estimated: >60 mL/min (ref >60)
Glucose, Bld: 192 mg/dL — ABNORMAL HIGH (ref 70–99)
Potassium: 4.1 mmol/L (ref 3.5–5.1)
Sodium: 140 mmol/L (ref 135–145)
Total Bilirubin: 0.6 mg/dL (ref 0.0–1.2)
Total Protein: 6.6 g/dL (ref 6.5–8.1)

## 2023-06-24 LAB — URINALYSIS, ROUTINE W REFLEX MICROSCOPIC
Bilirubin Urine: NEGATIVE
Glucose, UA: NEGATIVE mg/dL
Hgb urine dipstick: NEGATIVE
Ketones, ur: NEGATIVE mg/dL
Leukocytes,Ua: NEGATIVE
Nitrite: NEGATIVE
Protein, ur: NEGATIVE mg/dL
Specific Gravity, Urine: 1.013 (ref 1.005–1.030)
pH: 8 (ref 5.0–8.0)

## 2023-06-24 LAB — LIPASE, BLOOD: Lipase: 47 U/L (ref 11–51)

## 2023-06-24 MED ORDER — IOHEXOL 300 MG/ML  SOLN
100.0000 mL | Freq: Once | INTRAMUSCULAR | Status: AC | PRN
  Administered 2023-06-24: 100 mL via INTRAVENOUS

## 2023-06-24 NOTE — ED Provider Notes (Signed)
 [place] EMERGENCY DEPARTMENT AT [facility] Provider Note   CSN: [id number] Arrival date & time: 06/24/23  1800     History  Chief Complaint  Patient presents with   Abdominal Pain    Rhonda Higgins is a 53 y.o. female.  HPI   53 year old female presents emergency department left-sided abdominal pain.  She states that she has history of IBS.  Earlier she developed epigastric discomfort, nausea but states that this is usually baseline for her.  She started having abdominal cramping and took her Bentyl at home.  This gave her relief for about an hour but then she had left-sided abdominal pain that returned she came in for evaluation.  She has been having urinary frequency but denies any hematuria, dysuria, fever.  Also denies any diarrhea or bloody bowel movements.  Home Medications Prior to Admission medications   Medication Sig Start Date End Date Taking? Authorizing Provider  ACCRUFER 30 MG CAPS Take 1 capsule by mouth 2 (two) times daily. 09/14/22   [provider]  ACCU-CHEK GUIDE test strip  03/23/20   [provider]  Accu-Chek Softclix Lancets lancets daily. 07/21/20   [provider]  atorvastatin  (LIPITOR) 40 MG tablet Take 40 mg by mouth daily. 08/22/20   [provider]  buPROPion  (WELLBUTRIN  XL) 300 MG 24 hr tablet Take 300 mg by mouth daily.    [provider]  cetirizine (ZYRTEC) 10 MG tablet Take 10 mg by mouth daily as needed for allergies.    [provider]  Cyanocobalamin (B-12 PO) Take 1 tablet by mouth daily.    [provider]  dicyclomine (BENTYL) 20 MG tablet Take 20 mg by mouth 4 (four) times daily as needed for spasms. 10/05/22   [provider]  gabapentin  (NEURONTIN ) 400 MG capsule Take 400-1,200 mg by mouth See admin instructions. Takes 400 mg in the morning 400 mg in the afternoon and 1200 mg at night    [provider]  insulin  glargine (LANTUS ) 100 UNIT/ML  injection Inject 20 Units into the skin at bedtime.    [provider]  lisinopril  (PRINIVIL ,ZESTRIL ) 5 MG tablet Take 5 mg by mouth daily.    [provider]  metFORMIN  (GLUCOPHAGE -XR) 500 MG 24 hr tablet Take 500 mg by mouth 2 (two) times daily. 10/13/21   [provider]  methocarbamol  (ROBAXIN ) 500 MG tablet Take 1 tablet (500 mg total) by mouth every 8 (eight) hours as needed for muscle spasms. Patient not taking: Reported on 01/10/2023 11/17/22   Midge Golas, MD  ondansetron  (ZOFRAN -ODT) 4 MG disintegrating tablet Take 4 mg by mouth every 6 (six) hours as needed for vomiting or nausea.    [provider]  OZEMPIC, 0.25 OR 0.5 MG/DOSE, 2 MG/3ML SOPN Inject 0.5 mg into the skin every Sunday. 12/26/22   [provider]  pantoprazole  (PROTONIX ) 40 MG tablet Take 40 mg by mouth 2 (two) times daily.     [provider]  Peppermint Oil (IBGARD) 90 MG CPCR Take 2 tablets by mouth daily as needed (IBS). 12/08/19   [provider]  risperiDONE  (RISPERDAL ) 2 MG tablet Take 2 mg by mouth at bedtime.  08/22/16   [provider]  SYNTHROID  175 MCG tablet Take 175 mcg by mouth daily. 07/27/20   [provider]  traZODone  (DESYREL ) 50 MG tablet Take 1 tablet (50 mg total) by mouth at bedtime. Patient taking differently: Take 50 mg by mouth at bedtime as  needed for sleep. 04/18/21   Byrum, Robert S, MD  Vitamin D, Ergocalciferol, (DRISDOL) 1.25 MG (50000 UNIT) CAPS capsule Take 50,000 Units by mouth once a week. Sunday 02/15/22   [provider]      Allergies    Hydrocodone , Tape, Wound dressing adhesive, Pholcodine, and Codeine    Review of Systems   Review of Systems  Constitutional:  Negative for fever.  Respiratory:  Negative for shortness of breath.   Cardiovascular:  Negative for chest pain.  Gastrointestinal:  Positive for abdominal pain. Negative for blood in stool, diarrhea and vomiting.  Genitourinary:   Positive for frequency. Negative for dysuria and hematuria.  Skin:  Negative for rash.  Neurological:  Negative for headaches.    Physical Exam Updated Vital Signs BP 138/69 (BP Location: Right Arm)   Pulse 85   Temp 98.2 F (36.8 C) (Oral)   Resp 18   Ht 5' 3 (1.6 m)   Wt 95.3 kg   SpO2 100%   BMI 37.20 kg/m  Physical Exam Vitals and nursing note reviewed.  Constitutional:      General: She is not in acute distress.    Appearance: Normal appearance.  HENT:     Head: Normocephalic.     Mouth/Throat:     Mouth: Mucous membranes are moist.  Cardiovascular:     Rate and Rhythm: Normal rate.  Pulmonary:     Effort: Pulmonary effort is normal. No respiratory distress.  Abdominal:     General: Bowel sounds are normal. There is no distension.     Palpations: Abdomen is soft.     Tenderness: There is abdominal tenderness in the left upper quadrant. There is no guarding or rebound.  Skin:    General: Skin is warm.  Neurological:     Mental Status: She is alert and oriented to person, place, and time. Mental status is at baseline.  Psychiatric:        Mood and Affect: Mood normal.     ED Results / Procedures / Treatments   Labs (all labs ordered are listed, but only abnormal results are displayed) Labs Reviewed  COMPREHENSIVE METABOLIC PANEL - Abnormal; Notable for the following components:      Result Value   Glucose, Bld 192 (*)    Calcium  8.5 (*)    All other components within normal limits  CBC - Abnormal; Notable for the following components:   WBC 3.1 (*)    Hemoglobin 11.8 (*)    MCH 24.9 (*)    RDW 24.6 (*)    Platelets 80 (*)    All other components within normal limits  URINALYSIS, ROUTINE W REFLEX MICROSCOPIC - Abnormal; Notable for the following components:   APPearance HAZY (*)    All other components within normal limits  LIPASE, BLOOD    EKG None  Radiology CT ABDOMEN PELVIS W CONTRAST Result Date: 06/24/2023 CLINICAL DATA:  Left-sided  abdominal pain for several hours, initial encounter EXAM: CT ABDOMEN AND PELVIS WITH CONTRAST TECHNIQUE: Multidetector CT imaging of the abdomen and pelvis was performed using the standard protocol following bolus administration of intravenous contrast. RADIATION DOSE REDUCTION: This exam was performed according to the departmental dose-optimization program which includes automated exposure control, adjustment of the mA and/or kV according to patient size and/or use of iterative reconstruction technique. CONTRAST:  OMNIPAQUE  IOHEXOL  300 MG/ML  SOLN COMPARISON:  04/06/2023, MRI from 02/11/2023 FINDINGS: Lower chest: No acute abnormality. Hepatobiliary: Nodularity of the liver is noted consistent  with underlying cirrhosis. Gallbladder has been surgically removed. Vague 13 mm hypodensity is noted within the right lobe of the liver near the dome best seen on image number 11 of series 2. This was seen on recent MRI and felt to represent a regenerating nodule. Pancreas: Unremarkable. No pancreatic ductal dilatation or surrounding inflammatory changes. Spleen: Normal in size without focal abnormality. Adrenals/Urinary Tract: Adrenal glands are within normal limits. Kidneys are well visualized bilaterally. Normal enhancement pattern is seen. Multiple left renal calculi are seen. The largest of these measures 10 mm. No obstructive changes are seen. The ureters are within normal limits. The bladder is well distended. Stomach/Bowel: The appendix is within normal limits. No obstructive or inflammatory changes of the colon are seen. Small bowel and stomach are within normal limits. Vascular/Lymphatic: A few vascular structures are noted adjacent to the distal esophagus as well as adjacent to the gastric fundus consistent with varices. Atherosclerotic calcifications of the aorta are noted. No lymphadenopathy is seen. Reproductive: Status post hysterectomy. No adnexal masses. Other: No abdominal wall hernia or abnormality.  No abdominopelvic ascites. Musculoskeletal: No acute or significant osseous findings. IMPRESSION: Multiple left renal calculi without obstructive change. 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 Regenerative nodule within the right lobe of the liver near the dome stable from prior CT. Electronically Signed   By: Oneil Devonshire M.D.   On: 06/24/2023 21:58    Procedures Procedures    Medications Ordered in ED Medications  iohexol  (OMNIPAQUE ) 300 MG/ML solution 100 mL (100 mLs Intravenous Contrast Given 06/24/23 2139)    ED Course/ Medical Decision Making/ A&P                                 Medical Decision Making Amount and/or Complexity of Data Reviewed Labs: ordered. Radiology: ordered.  Risk Prescription drug management.   53 year old female presents emergency department left-sided abdominal pain, not improved with her home IBS meds.  Vital signs are normal and stable on arrival.  Blood work is baseline, no leukocytosis or acute abnormality.  However on exam she is tender in the left side of the abdomen, plan for CT of the abdomen pelvis.  CT of the abdomen pelvis identifies left-sided renal stones, none that are currently passing.  No other acute finding in the abdomen.  Patient at this time appears safe and stable for discharge and close outpatient follow up. Discharge plan and strict return to ED precautions discussed, patient verbalizes understanding and agreement.        Final Clinical Impression(s) / ED Diagnoses Final diagnoses:  None    Rx / DC Orders ED Discharge Orders     None         Bari Roxie HERO, DO 06/24/23 2240

## 2023-06-24 NOTE — Discharge Instructions (Signed)
 You have been seen and discharged from the emergency department.  Your blood work was normal.  Your CT scan showed left-sided kidney stones but not 1 currently passing.  It is possible that you passed a kidney stone.  Otherwise there was no acute findings at your visit today.  Follow-up with your primary provider for further evaluation and further care. Take home medications as prescribed. If you have any worsening symptoms or further concerns for your health please return to an emergency department for further evaluation.

## 2023-06-24 NOTE — ED Triage Notes (Signed)
 Left sided abdominal pain for 5 hours. Pt states she is nauseous, but is always nauseous. No diarrhea. No relief with bentyl at home.

## 2023-06-27 ENCOUNTER — Ambulatory Visit: Payer: Medicaid Other | Admitting: Orthopedic Surgery

## 2023-07-03 ENCOUNTER — Other Ambulatory Visit: Payer: Self-pay | Admitting: Urology

## 2023-07-04 NOTE — Patient Instructions (Addendum)
SURGICAL WAITING ROOM VISITATION  Patients having surgery or a procedure may have no more than 2 support people in the waiting area - these visitors may rotate.    Children under the age of 45 must have an adult with them who is not the patient.  Due to an increase in RSV and influenza rates and associated hospitalizations, children ages 35 and under may not visit patients in [facility] hospitals.  Visitors with respiratory illnesses are discouraged from visiting and should remain at home.  If the patient needs to stay at the hospital during part of their recovery, the visitor guidelines for inpatient rooms apply. Pre-op nurse will coordinate an appropriate time for 1 support person to accompany patient in pre-op.  This support person may not rotate.    Please refer to the [facility] website for the visitor guidelines for Inpatients (after your surgery is over and you are in a regular room).    Your procedure is scheduled on: 07/27/23   Report to [facility] Main Entrance    Report to admitting at 12:30 PM   Call this number if you have problems the morning of surgery [phone]   Do not eat food or drink liquids :After Midnight.          If you have questions, please contact your surgeon's office.   FOLLOW BOWEL PREP AND ANY ADDITIONAL PRE OP INSTRUCTIONS YOU RECEIVED FROM YOUR SURGEON'S OFFICE!!!     Oral Hygiene is also important to reduce your risk of infection.                                    Remember - BRUSH YOUR TEETH THE MORNING OF SURGERY WITH YOUR REGULAR TOOTHPASTE  DENTURES WILL BE REMOVED PRIOR TO SURGERY PLEASE DO NOT APPLY "Poly grip" OR ADHESIVES!!!   Stop all vitamins and herbal supplements 7 days before surgery.   Take these medicines the morning of surgery with A SIP OF WATER: Atorvastatin, Bupropion, Zyrtec, Gabapentin, Zofran, Pantoprazole, Synthroid   DO NOT TAKE ANY ORAL DIABETIC MEDICATIONS DAY OF YOUR SURGERY  How to Manage Your  Diabetes Before and After Surgery  Why is it important to control my blood sugar before and after surgery? Improving blood sugar levels before and after surgery helps healing and can limit problems. A way of improving blood sugar control is eating a healthy diet by:  Eating less sugar and carbohydrates  Increasing activity/exercise  Talking with your doctor about reaching your blood sugar goals High blood sugars (greater than 180 mg/dL) can raise your risk of infections and slow your recovery, so you will need to focus on controlling your diabetes during the weeks before surgery. Make sure that the doctor who takes care of your diabetes knows about your planned surgery including the date and location.  How do I manage my blood sugar before surgery? Check your blood sugar at least 4 times a day, starting 2 days before surgery, to make sure that the level is not too high or low. Check your blood sugar the morning of your surgery when you wake up and every 2 hours until you get to the Short Stay unit. If your blood sugar is less than 70 mg/dL, you will need to treat for low blood sugar: Do not take insulin. Treat a low blood sugar (less than 70 mg/dL) with  cup of clear juice (cranberry or apple), 4  glucose tablets, OR glucose gel. Recheck blood sugar in 15 minutes after treatment (to make sure it is greater than 70 mg/dL). If your blood sugar is not greater than 70 mg/dL on recheck, call [phone] for further instructions. Report your blood sugar to the short stay nurse when you get to Short Stay.  If you are admitted to the hospital after surgery: Your blood sugar will be checked by the staff and you will probably be given insulin after surgery (instead of oral diabetes medicines) to make sure you have good blood sugar levels. The goal for blood sugar control after surgery is 80-180 mg/dL.   WHAT DO I DO ABOUT MY DIABETES MEDICATION?  Do not take oral diabetes medicines (pills) the  morning of surgery.  Hold Ozempic for 7 days prior to surgery. Last dose 07/16/23. Do not take 07/23/23.  THE DAY BEFORE SURGERY, take 50% of Lantus dose. Take Metformin as prescribed.      THE MORNING OF SURGERY, do not take Metformin or Lantus.  DO NOT TAKE THE FOLLOWING 7 DAYS PRIOR TO SURGERY: Ozempic, Wegovy, Rybelsus (Semaglutide), Byetta (exenatide), Bydureon (exenatide ER), Victoza, Saxenda (liraglutide), or Trulicity (dulaglutide) Mounjaro (Tirzepatide) Adlyxin (Lixisenatide), Polyethylene Glycol Loxenatide.  Reviewed and Endorsed by [facility] Patient Education Committee, August 2015             You may not have any metal on your body including hair pins, jewelry, and body piercing             Do not wear make-up, lotions, powders, perfumes, or deodorant  Do not wear nail polish including gel and S&S, artificial/acrylic nails, or any other type of covering on natural nails including finger and toenails. If you have artificial nails, gel coating, etc. that needs to be removed by a nail salon please have this removed prior to surgery or surgery may need to be canceled/ delayed if the surgeon/ anesthesia feels like they are unable to be safely monitored.   Do not shave  48 hours prior to surgery.    Do not bring valuables to the hospital. [facility] IS NOT             RESPONSIBLE   FOR VALUABLES.   Contacts, glasses, dentures or bridgework may not be worn into surgery.  DO NOT BRING YOUR HOME MEDICATIONS TO THE HOSPITAL. PHARMACY WILL DISPENSE MEDICATIONS LISTED ON YOUR MEDICATION LIST TO YOU DURING YOUR ADMISSION IN THE HOSPITAL!    Patients discharged on the day of surgery will not be allowed to drive home.  Someone NEEDS to stay with you for the first 24 hours after anesthesia.              Please read over the following fact sheets you were given: IF YOU HAVE QUESTIONS ABOUT YOUR PRE-OP INSTRUCTIONS PLEASE CALL [phone] [place]    If you received a COVID test during  your pre-op visit  it is requested that you wear a mask when out in public, stay away from anyone that may not be feeling well and notify your surgeon if you develop symptoms. If you test positive for Covid or have been in contact with anyone that has tested positive in the last 10 days please notify you surgeon.    [facility] - Preparing for Surgery Before surgery, you can play an important role.  Because skin is not sterile, your skin needs to be as free of germs as possible.  You can reduce the number of germs  on your skin by washing with CHG (chlorahexidine gluconate) soap before surgery.  CHG is an antiseptic cleaner which kills germs and bonds with the skin to continue killing germs even after washing. Please DO NOT use if you have an allergy to CHG or antibacterial soaps.  If your skin becomes reddened/irritated stop using the CHG and inform your nurse when you arrive at Short Stay. Do not shave (including legs and underarms) for at least 48 hours prior to the first CHG shower.  You may shave your face/neck.  Please follow these instructions carefully:  1.  Shower with CHG Soap the night before surgery and the  morning of surgery.  2.  If you choose to wash your hair, wash your hair first as usual with your normal  shampoo.  3.  After you shampoo, rinse your hair and body thoroughly to remove the shampoo.                             4.  Use CHG as you would any other liquid soap.  You can apply chg directly to the skin and wash.  Gently with a scrungie or clean washcloth.  5.  Apply the CHG Soap to your body ONLY FROM THE NECK DOWN.   Do   not use on face/ open                           Wound or open sores. Avoid contact with eyes, ears mouth and   genitals (private parts).                       Wash face,  Genitals (private parts) with your normal soap.             6.  Wash thoroughly, paying special attention to the area where your    surgery  will be performed.  7.  Thoroughly rinse your  body with warm water from the neck down.  8.  DO NOT shower/wash with your normal soap after using and rinsing off the CHG Soap.                9.  Pat yourself dry with a clean towel.            10.  Wear clean pajamas.            11.  Place clean sheets on your bed the night of your first shower and do not  sleep with pets. Day of Surgery : Do not apply any lotions/deodorants the morning of surgery.  Please wear clean clothes to the hospital/surgery center.  FAILURE TO FOLLOW THESE INSTRUCTIONS MAY RESULT IN THE CANCELLATION OF YOUR SURGERY  PATIENT SIGNATURE_________________________________  NURSE SIGNATURE__________________________________  ________________________________________________________________________

## 2023-07-04 NOTE — Progress Notes (Addendum)
COVID Vaccine Completed: no  Date of COVID positive in last 90 days: no  PCP - Knox Royalty, MD Cardiologist - Dr. Marni Griffon, due to family hx of cardiac disease   Chest x-ray - n/a EKG - 01/17/23 Epic Stress Test - yes 2005 per pt ECHO - 2016 Cardiac Cath - n/a Pacemaker/ICD device last checked: n/a Spinal Cord Stimulator: n/a  Bowel Prep -   Sleep Study - yes, no longer CPAP -   Fasting Blood Sugar - 90 Checks Blood Sugar 2 times a day  Last dose of GLP1 agonist-  Ozempic, takes on Mondays GLP1 instructions:  Hold 7 days before surgery, hold 07/23/23   Last dose of SGLT-2 inhibitors-  N/A SGLT-2 instructions:  Hold 3 days before surgery    Blood Thinner Instructions: n/a Aspirin Instructions: Last Dose:  Activity level: Can go up a flight of stairs and perform activities of daily living without stopping and without symptoms of chest pain or shortness of breath.  Anesthesia review: platelets 94, OSA, cirrhosis   Patient denies shortness of breath, fever, cough and chest pain at PAT appointment  Patient verbalized understanding of instructions that were given to them at the PAT appointment. Patient was also instructed that they will need to review over the PAT instructions again at home before surgery.

## 2023-07-05 ENCOUNTER — Encounter (HOSPITAL_COMMUNITY): Payer: Self-pay

## 2023-07-05 ENCOUNTER — Encounter (HOSPITAL_COMMUNITY)
Admission: RE | Admit: 2023-07-05 | Discharge: 2023-07-05 | Disposition: A | Discharge status: Home / Self Care | Payer: Medicaid Other | Source: Ambulatory Visit | Attending: Urology | Admitting: Urology

## 2023-07-05 ENCOUNTER — Other Ambulatory Visit: Payer: Self-pay

## 2023-07-05 VITALS — BP 153/85 | HR 64 | Temp 98.3°F | Resp 18 | Ht 63.0 in | Wt 211.0 lb

## 2023-07-05 DIAGNOSIS — E119 Type 2 diabetes mellitus without complications: Secondary | ICD-10-CM | POA: Diagnosis not present

## 2023-07-05 DIAGNOSIS — Z01812 Encounter for preprocedural laboratory examination: Secondary | ICD-10-CM | POA: Insufficient documentation

## 2023-07-05 DIAGNOSIS — Z794 Long term (current) use of insulin: Secondary | ICD-10-CM | POA: Diagnosis not present

## 2023-07-05 DIAGNOSIS — K219 Gastro-esophageal reflux disease without esophagitis: Secondary | ICD-10-CM | POA: Diagnosis not present

## 2023-07-05 DIAGNOSIS — I10 Essential (primary) hypertension: Secondary | ICD-10-CM | POA: Insufficient documentation

## 2023-07-05 DIAGNOSIS — K7460 Unspecified cirrhosis of liver: Secondary | ICD-10-CM | POA: Insufficient documentation

## 2023-07-05 DIAGNOSIS — Z7985 Long-term (current) use of injectable non-insulin antidiabetic drugs: Secondary | ICD-10-CM | POA: Insufficient documentation

## 2023-07-05 DIAGNOSIS — Z87898 Personal history of other specified conditions: Secondary | ICD-10-CM | POA: Insufficient documentation

## 2023-07-05 DIAGNOSIS — E890 Postprocedural hypothyroidism: Secondary | ICD-10-CM | POA: Diagnosis not present

## 2023-07-05 DIAGNOSIS — E785 Hyperlipidemia, unspecified: Secondary | ICD-10-CM | POA: Diagnosis not present

## 2023-07-05 DIAGNOSIS — Z7984 Long term (current) use of oral hypoglycemic drugs: Secondary | ICD-10-CM | POA: Diagnosis not present

## 2023-07-05 DIAGNOSIS — D6959 Other secondary thrombocytopenia: Secondary | ICD-10-CM | POA: Insufficient documentation

## 2023-07-05 DIAGNOSIS — N200 Calculus of kidney: Secondary | ICD-10-CM | POA: Diagnosis not present

## 2023-07-05 LAB — CBC
HCT: 42.5 % (ref 36.0–46.0)
Hemoglobin: 12.9 g/dL (ref 12.0–15.0)
MCH: 25 pg — ABNORMAL LOW (ref 26.0–34.0)
MCHC: 30.4 g/dL (ref 30.0–36.0)
MCV: 82.2 fL (ref 80.0–100.0)
Platelets: 94 10*3/uL — ABNORMAL LOW (ref 150–400)
RBC: 5.17 MIL/uL — ABNORMAL HIGH (ref 3.87–5.11)
RDW: 23.3 % — ABNORMAL HIGH (ref 11.5–15.5)
WBC: 3.7 10*3/uL — ABNORMAL LOW (ref 4.0–10.5)
nRBC: 0 % (ref 0.0–0.2)

## 2023-07-05 LAB — GLUCOSE, CAPILLARY: Glucose-Capillary: 117 mg/dL — ABNORMAL HIGH (ref 70–99)

## 2023-07-05 LAB — COMPREHENSIVE METABOLIC PANEL
ALT: 25 U/L (ref 0–44)
AST: 35 U/L (ref 15–41)
Albumin: 4 g/dL (ref 3.5–5.0)
Alkaline Phosphatase: 65 U/L (ref 38–126)
Anion gap: 10 (ref 5–15)
BUN: 10 mg/dL (ref 6–20)
CO2: 26 mmol/L (ref 22–32)
Calcium: 9.2 mg/dL (ref 8.9–10.3)
Chloride: 105 mmol/L (ref 98–111)
Creatinine, Ser: 0.53 mg/dL (ref 0.44–1.00)
GFR, Estimated: >60 mL/min (ref >60)
Glucose, Bld: 123 mg/dL — ABNORMAL HIGH (ref 70–99)
Potassium: 4.3 mmol/L (ref 3.5–5.1)
Sodium: 141 mmol/L (ref 135–145)
Total Bilirubin: 0.8 mg/dL (ref 0.0–1.2)
Total Protein: 7.1 g/dL (ref 6.5–8.1)

## 2023-07-06 NOTE — Anesthesia Preprocedure Evaluation (Signed)
Anesthesia Evaluation    Airway        Dental   Pulmonary           Cardiovascular      Neuro/Psych    GI/Hepatic   Endo/Other  diabetes    Renal/GU      Musculoskeletal   Abdominal   Peds  Hematology   Anesthesia Other Findings   Reproductive/Obstetrics                              Anesthesia Physical Anesthesia Plan  ASA:   Anesthesia Plan:    Post-op Pain Management:    Induction:   PONV Risk Score and Plan:   Airway Management Planned:   Additional Equipment:   Intra-op Plan:   Post-operative Plan:   Informed Consent:   Plan Discussed with:   Anesthesia Plan Comments: (See PAT note from 1/16 by Sherlie Ban PA-C )         Anesthesia Quick Evaluation

## 2023-07-06 NOTE — Progress Notes (Signed)
DISCUSSION: Rhonda Higgins is a 53 yo female who presents to PAT prior to CYSTOSCOPYLEFT/URETEROSCOPY/HOLMIUM LASER/STENT PLACEMENT on 07/27/23 with Dr. Alvester Morin for kidney stones. PMH of PONV, HLD, HTN, GERD, goiter s/p total thyroidectomy, IDDM2 (A1c 6.8 on 12/26/22), Hx of OSA (retest after weight loss in 2017 stated CPAP not warranted), cirrhosis with resultant thrombocytopenia.  Follows with cardiologist Dr. Sharyn Lull for risk factor modification in the setting of family hx of CVD. Pt has no personal hx of CVD. Last seen 12/19/22 and HTN, IDDM2, and HLD were noted to be well controlled.    Chronic thrombocytopenia. Evaluated by hematology and felt secondary to cirrhosis.    Pt on once weekly GLP1a, reports last dose 07/23/23   Preop CMP reviewed, unremarkable. CBC needs to be recollected DOS. VS: BP (!) 153/85   Pulse 64   Temp 36.8 C (Oral)   Resp 18   Ht 5\' 3"  (1.6 m)   Wt 95.7 kg   SpO2 100%   BMI 37.38 kg/m   PROVIDERS: Knox Royalty, MD Cardiologist - Dr. Marni Griffon, due to family hx of cardiac disease   LABS: Labs reviewed: Acceptable for surgery. Leukopenia, thrombocytopenia stable. (all labs ordered are listed, but only abnormal results are displayed)  Labs Reviewed  COMPREHENSIVE METABOLIC PANEL - Abnormal; Notable for the following components:      Result Value   Glucose, Bld 123 (*)    All other components within normal limits  CBC - Abnormal; Notable for the following components:   WBC 3.7 (*)    RBC 5.17 (*)    MCH 25.0 (*)    RDW 23.3 (*)    Platelets 94 (*)    All other components within normal limits  GLUCOSE, CAPILLARY - Abnormal; Notable for the following components:   Glucose-Capillary 117 (*)    All other components within normal limits     IMAGES:  CT Abd/Pelvis 06/24/23:  IMPRESSION: Multiple left renal calculi without obstructive change.   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   Regenerative nodule  within the right lobe of the liver near the dome stable from prior CT.  EKG 01/17/23:   Sinus rhythm with marked sinus arrhythmia. Rate 72. Low voltage QRS  CV:  TTE 07/20/2014: - Left ventricle: The cavity size was normal. Systolic function was normal. The estimated ejection fraction was in the range of 55% to 60%. Wall motion was normal; there were no regional wall motion abnormalities. Left ventricular diastolic function parameters were normal.  - Atrial septum: No defect or patent foramen ovale was identified.    Nuclear stress test 06/09/2008: IMPRESSION:  No evidence for ischemia.  Normal wall motion and ejection fraction measures 83%.    Past Medical History:  Diagnosis Date   Adenocarcinoma (HCC)    endometrial, FIGO GRADE 1   Allergic rhinitis    Arthritis    Atypical chest pain    History of   Borderline personality disorder (HCC)    Cirrhosis (HCC)    Depression    Elevated liver enzymes    GERD (gastroesophageal reflux disease)    Hematuria    History of endometrial cancer 08-02-2009   oncologist-  dr brewster/ Andrey Farmer and dr kinard/  no recurrence   endometrial adenocarinoma Stage 1B, Grade 1, FIGO--  s/p  TAH w/ BSO and pelvic lymph node dissection's and radiation therapy   History of kidney stones    History of radiation therapy    2011  pelvic intracavity brachytherapy treatment's for endometrial carcinoma   History of thyroid nodule    multinodular goiter s/p  total thyroidectomy 11-19-2015  per pathology -  adenomatoid nodules   Hyperlipidemia    Hypothyroidism, postsurgical    Insulin dependent diabetes mellitus    Type 2   Left ureteral stone    Obesity    OSA (obstructive sleep apnea)    sleep study 2017 showed OSA negative (pt had OSA in 2011, and lost weight, no issues since)   Overactive bladder    Personality disorder (HCC)    Polyphagia(783.6)    PONV (postoperative nausea and vomiting)    after ear surgery only one time   Right lower quadrant  pain    Umbilical hernia    Urgency of urination    UTI (urinary tract infection)     Past Surgical History:  Procedure Laterality Date   ANTERIOR CERVICAL DECOMP/DISCECTOMY FUSION N/A 01/26/2023   Procedure: C5-6, C6-7 ANTERIOR CERVICAL DECOMPRESSION/DISCECTOMY FUSION 2 LEVELS;  Surgeon: London Sheer, MD;  Location: [facility] OR;  Service: Orthopedics;  Laterality: N/A;   BIOPSY  04/25/2022   Procedure: BIOPSY;  Surgeon: Kerin Salen, MD;  Location: [facility] ENDOSCOPY;  Service: Gastroenterology;;   BIOPSY  04/10/2023   Procedure: BIOPSY;  Surgeon: Kerin Salen, MD;  Location: [facility] ENDOSCOPY;  Service: Gastroenterology;;   BRONCHIAL BIOPSY  04/18/2021   Procedure: BRONCHIAL BIOPSIES;  Surgeon: Leslye Peer, MD;  Location: [facility] ENDOSCOPY;  Service: Pulmonary;;   BRONCHIAL BRUSHINGS  04/18/2021   Procedure: BRONCHIAL BRUSHINGS;  Surgeon: Leslye Peer, MD;  Location: [facility] ENDOSCOPY;  Service: Pulmonary;;   BRONCHIAL NEEDLE ASPIRATION BIOPSY  04/18/2021   Procedure: BRONCHIAL NEEDLE ASPIRATION BIOPSIES;  Surgeon: Leslye Peer, MD;  Location: [facility] ENDOSCOPY;  Service: Pulmonary;;   CARDIOVASCULAR STRESS TEST  06/09/2008   normal nuclear study w/ no ischemia/  normal LV function and wall motion , ef 83%   CHOLECYSTECTOMY N/A 09/06/2017   Procedure: LAPAROSCOPIC CHOLECYSTECTOMY WITH INTRAOPERATIVE CHOLANGIOGRAM;  Surgeon: Darnell Level, MD;  Location: [facility] ORS;  Service: General;  Laterality: N/A;   COLONOSCOPY WITH PROPOFOL N/A 04/25/2022   Procedure: COLONOSCOPY WITH PROPOFOL;  Surgeon: Kerin Salen, MD;  Location: [facility] ENDOSCOPY;  Service: Gastroenterology;  Laterality: N/A;   CYSTOSCOPY/RETROGRADE/URETEROSCOPY/STONE EXTRACTION WITH BASKET Left 03/08/2016   Procedure: CYSTOSCOPY/RETROGRADE/URETEROSCOPY/STONE EXTRACTION WITH BASKET, STENT PLACEMENT;  Surgeon: Barron Alvine, MD;  Location: [facility];  Service: Urology;  Laterality: Left;   ENDOMETRIAL BIOPSY     ERCP N/A 09/07/2017   Procedure:  ENDOSCOPIC RETROGRADE CHOLANGIOPANCREATOGRAPHY (ERCP);  Surgeon: Kerin Salen, MD;  Location: [facility] ENDOSCOPY;  Service: Gastroenterology;  Laterality: N/A;   ESOPHAGEAL BANDING  04/10/2023   Procedure: ESOPHAGEAL BANDING;  Surgeon: Kerin Salen, MD;  Location: [facility] ENDOSCOPY;  Service: Gastroenterology;;   ESOPHAGOGASTRODUODENOSCOPY N/A 04/25/2022   Procedure: ESOPHAGOGASTRODUODENOSCOPY (EGD);  Surgeon: Kerin Salen, MD;  Location: [facility] ENDOSCOPY;  Service: Gastroenterology;  Laterality: N/A;   ESOPHAGOGASTRODUODENOSCOPY N/A 04/10/2023   Procedure: ESOPHAGOGASTRODUODENOSCOPY (EGD);  Surgeon: Kerin Salen, MD;  Location: [facility] ENDOSCOPY;  Service: Gastroenterology;  Laterality: N/A;  with possible banding for esophageal varices   ESOPHAGOGASTRODUODENOSCOPY (EGD) WITH PROPOFOL N/A 03/16/2020   Procedure: ESOPHAGOGASTRODUODENOSCOPY (EGD) WITH PROPOFOL;  Surgeon: Kerin Salen, MD;  [facility] ENDOS[facility];  Service: Gastroenterology;  Laterality: N/A;  unable to locate ampulla, aborted ERCP, changed to EGD   HEMOSTASIS CLIP PLACEMENT  04/25/2022   Procedure: HEMOSTASIS CLIP PLACEMENT;  Surgeon: Kerin Salen, MD;  Location: [facility] ENDOSCOPY;  Service: Gastroenterology;;  HEMOSTASIS CONTROL  04/25/2022   Procedure: HEMOSTASIS CONTROL;  Surgeon: Kerin Salen, MD;  Location: [facility] ENDOSCOPY;  Service: Gastroenterology;;   HOLMIUM LASER APPLICATION Left 03/08/2016   Procedure: HOLMIUM LASER APPLICATION;  Surgeon: Barron Alvine, MD;  Location: [facility];  Service: Urology;  Laterality: Left;   MOUTH SURGERY     MYRINGECOTMY W/ REMOVAL MIDDLE EAR CHOLESTEATOMA TYPE 1 FASICA TYMPANOPLASTY  09/13/2000   POLYPECTOMY  04/25/2022   Procedure: POLYPECTOMY;  Surgeon: Kerin Salen, MD;  Location: [facility] ENDOSCOPY;  Service: Gastroenterology;;   ROBOTIC ASSISTED TOTAL HYSTERECTOMY WITH BILATERAL SALPINGO OOPHERECTOMY  08-02-2009   at [facility]  dr Andrey Farmer   w/  Bilateral pelvic and para aortic lymph node dissection's   THYROIDECTOMY N/A  11/19/2015   Procedure: TOTAL THYROIDECTOMY;  Surgeon: Darnell Level, MD;  Location: [facility] ORS;  Service: General;  Laterality: N/A;   TONSILLECTOMY  age 76   TRANSTHORACIC ECHOCARDIOGRAM  07/19/2014   ef 55-60%/  trivial TR   TYMPANOPLASTY Right 1993   VIDEO BRONCHOSCOPY WITH ENDOBRONCHIAL NAVIGATION Left 04/18/2021   Procedure: ROBOTIC ASSISTED BRONCHOSCOPY WITH ENDOBRONCHIAL NAVIGATION;  Surgeon: Leslye Peer, MD;  Location: [facility] ENDOSCOPY;  Service: Pulmonary;  Laterality: Left;   VIDEO BRONCHOSCOPY WITH RADIAL ENDOBRONCHIAL ULTRASOUND  04/18/2021   Procedure: RADIAL ENDOBRONCHIAL ULTRASOUND;  Surgeon: Leslye Peer, MD;  Location: [facility] ENDOSCOPY;  Service: Pulmonary;;    MEDICATIONS:  ACCRUFER 30 MG CAPS   ACCU-CHEK GUIDE test strip   Accu-Chek Softclix Lancets lancets   atorvastatin (LIPITOR) 40 MG tablet   buPROPion (WELLBUTRIN XL) 300 MG 24 hr tablet   cetirizine (ZYRTEC) 10 MG tablet   dicyclomine (BENTYL) 20 MG tablet   gabapentin (NEURONTIN) 400 MG capsule   hydrOXYzine (ATARAX) 25 MG tablet   insulin glargine (LANTUS) 100 UNIT/ML injection   lisinopril (PRINIVIL,ZESTRIL) 5 MG tablet   metFORMIN (GLUCOPHAGE-XR) 500 MG 24 hr tablet   methocarbamol (ROBAXIN) 500 MG tablet   ondansetron (ZOFRAN-ODT) 4 MG disintegrating tablet   OZEMPIC, 1 MG/DOSE, 4 MG/3ML SOPN   pantoprazole (PROTONIX) 40 MG tablet   risperiDONE (RISPERDAL) 2 MG tablet   SYNTHROID 175 MCG tablet   traZODone (DESYREL) 50 MG tablet   Vitamin D, Ergocalciferol, (DRISDOL) 1.25 MG (50000 UNIT) CAPS capsule   No current facility-administered medications for this encounter.   Marcille Blanco [facility] Surgical Short Stay/Anesthesiology [facility] Phone [phone] 07/06/2023 2:26 PM

## 2023-07-09 ENCOUNTER — Ambulatory Visit: Payer: Medicaid Other | Admitting: Orthopedic Surgery

## 2023-07-09 DIAGNOSIS — M5412 Radiculopathy, cervical region: Secondary | ICD-10-CM | POA: Diagnosis not present

## 2023-07-09 NOTE — Progress Notes (Signed)
Orthopedic Surgery Post-operative Office Visit   Procedure: C5-7 ACDF Date of Surgery: 01/26/2023 (~5 months post-op)   Assessment: Patient is a 53 y.o. female who had been doing well after surgery but then developed radiating right arm pain that has not improved with nonoperative treatments     Plan: -Patient has tried tylenol, gabapentin, tramadol, cervical injection since onset of this right arm pain -Since she been trying conservative treatment for over 6 weeks without any relief, recommended MRI of the cervical spine to evaluate for radiculopathy -No spine specific precautions -Return to office in 3-4 weeks, films needed at next visit: none   ___________________________________________________________________________     Subjective: Patient continues to have pain in her right upper extremity.  She feels like going along the lateral arm, into the dorsal forearm, and into her fingers.  She feels it in noted all the fingers.  She does not have any pain radiating into the left upper extremity.  She has been trying tramadol, Tylenol, gabapentin without any significant relief.  After our last visit, she got a cervical injection and did not found that to be mildly helpful in terms of reducing her pain.  However, she is still having significant pain.  Has not noticed any weakness in her upper extremities.  No bowel or bladder incontinence.  No saddle anesthesia.   Objective:   General: no acute distress, appropriate affect Neurologic: alert, answering questions appropriately, following commands Respiratory: unlabored breathing on room air Skin: incision is well healed   MSK (spine):   -Strength exam                                                   Left                  Right Grip strength                5/5                  5/5 Interosseus                  5/5                  5/5 Wrist extension            5/5                  5/5 Wrist flexion                 5/5                   5/5 Elbow flexion                5/5                  5/5 Deltoid                          5/5                  5/5   -Sensory exam                           Sensation intact to light touch in C5-T1 nerve distributions of bilateral upper  extremities     Imaging: XRs of the cervical spine taken 05/23/2023 were previously independently reviewed and interpreted, showing interbody allografts at C5/6 and C6/7 that appear in appropriate position. There is anterior plate with screws from C5-7. No lucency seen around the screws. No spondylolisthesis seen on flexion/extension views. No fracture or dislocation seen.      Patient name: Rhonda Higgins Patient MRN: [id number] Date of visit: 07/09/23

## 2023-07-11 ENCOUNTER — Ambulatory Visit (INDEPENDENT_AMBULATORY_CARE_PROVIDER_SITE_OTHER): Payer: Medicaid Other | Admitting: Podiatry

## 2023-07-11 ENCOUNTER — Encounter: Payer: Self-pay | Admitting: Podiatry

## 2023-07-11 DIAGNOSIS — M79674 Pain in right toe(s): Secondary | ICD-10-CM

## 2023-07-11 DIAGNOSIS — B351 Tinea unguium: Secondary | ICD-10-CM

## 2023-07-11 DIAGNOSIS — E1142 Type 2 diabetes mellitus with diabetic polyneuropathy: Secondary | ICD-10-CM

## 2023-07-11 DIAGNOSIS — M79675 Pain in left toe(s): Secondary | ICD-10-CM

## 2023-07-18 ENCOUNTER — Other Ambulatory Visit: Payer: Self-pay | Admitting: Surgical

## 2023-07-18 ENCOUNTER — Other Ambulatory Visit (INDEPENDENT_AMBULATORY_CARE_PROVIDER_SITE_OTHER): Payer: Self-pay

## 2023-07-18 ENCOUNTER — Encounter: Payer: Self-pay | Admitting: Surgical

## 2023-07-18 ENCOUNTER — Encounter: Payer: Self-pay | Admitting: Hematology and Oncology

## 2023-07-18 ENCOUNTER — Ambulatory Visit: Payer: Medicaid Other | Admitting: Surgical

## 2023-07-18 ENCOUNTER — Other Ambulatory Visit: Payer: Self-pay

## 2023-07-18 DIAGNOSIS — M79644 Pain in right finger(s): Secondary | ICD-10-CM

## 2023-07-18 DIAGNOSIS — M65961 Unspecified synovitis and tenosynovitis, right lower leg: Secondary | ICD-10-CM

## 2023-07-18 DIAGNOSIS — G8929 Other chronic pain: Secondary | ICD-10-CM

## 2023-07-18 DIAGNOSIS — M25561 Pain in right knee: Secondary | ICD-10-CM | POA: Diagnosis not present

## 2023-07-18 DIAGNOSIS — M65831 Other synovitis and tenosynovitis, right forearm: Secondary | ICD-10-CM

## 2023-07-18 DIAGNOSIS — M25531 Pain in right wrist: Secondary | ICD-10-CM | POA: Diagnosis not present

## 2023-07-18 MED ORDER — BUPIVACAINE HCL 0.25 % IJ SOLN
0.3300 mL | INTRAMUSCULAR | Status: AC | PRN
  Administered 2023-07-18: .33 mL

## 2023-07-18 MED ORDER — BUPIVACAINE HCL 0.25 % IJ SOLN
4.0000 mL | INTRAMUSCULAR | Status: AC | PRN
  Administered 2023-07-18: 4 mL via INTRA_ARTICULAR

## 2023-07-18 MED ORDER — METHYLPREDNISOLONE ACETATE 40 MG/ML IJ SUSP
13.3300 mg | INTRAMUSCULAR | Status: AC | PRN
  Administered 2023-07-18: 13.33 mg

## 2023-07-18 MED ORDER — LIDOCAINE HCL 1 % IJ SOLN
5.0000 mL | INTRAMUSCULAR | Status: AC | PRN
  Administered 2023-07-18: 5 mL

## 2023-07-18 MED ORDER — LIDOCAINE HCL 1 % IJ SOLN
3.0000 mL | INTRAMUSCULAR | Status: AC | PRN
  Administered 2023-07-18: 3 mL

## 2023-07-18 MED ORDER — METHYLPREDNISOLONE ACETATE 40 MG/ML IJ SUSP
40.0000 mg | INTRAMUSCULAR | Status: AC | PRN
  Administered 2023-07-18: 40 mg via INTRA_ARTICULAR

## 2023-07-18 NOTE — Progress Notes (Signed)
Office Visit Note   Patient: Rhonda Higgins           Date of Birth: May 01, 1971           MRN: [id number] Visit Date: 07/18/2023 Requested by: Knox Royalty, MD [address] [place],  [place] [address] PCP: Knox Royalty, MD  Subjective: No chief complaint on file.   HPI: Rhonda Higgins is a 53 y.o. female who presents to the office reporting right thumb/wrist pain.  Patient reports that she awoke with pain about 3 weeks ago without any history of injury.  She is right-hand dominant.  She has pain that will radiate from the wrist down into the thumb at times.  No numbness or tingling that is new.  She has occasional catching sensation around the thumb but cannot really localize where exactly this occurs..    She also describes right knee pain primarily in the medial aspect of the knee without any recent injury.  She has had prior radiographs from 2022 demonstrating minimal joint space narrowing and had good relief from a cortisone injection around that time.  She would like to repeat this today.              ROS: All systems reviewed are negative as they relate to the chief complaint within the history of present illness.  Patient denies fevers or chills.  Assessment & Plan: Visit Diagnoses:  1. Extensor intersection syndrome of right wrist   2. Chronic pain of right thumb   3. Pain in right wrist   4. Synovitis of right knee     Plan: Patient is a 53 year old female who presents for multiple joint complaints primarily in the right wrist and the right knee.  She would like to try right knee injection.  This was administered and patient tolerated procedure well.  She will let us know if this does not improve her symptoms and with the minimal degenerative changes noted on her last set of radiographs from 2022, neck step would be MRI if she has persistent knee pain.  Regarding the right wrist/thumb, she has ultrasound examination today demonstrated area of maximal  tenderness over the intersection zone between the third dorsal compartment and the second dorsal compartment.  She would like to try cortisone injection in this area and this was administered under ultrasound guidance into the second dorsal compartment.  Tolerated procedure well without complication and there was care taken to avoid injection into the tendon itself with no resistance during administration of the injection.  Recommended that she reach out to [place] in 1 to 2 weeks to let us know how the injection has done for her and follow-up with the office as needed unless symptoms recur.  Follow-Up Instructions: No follow-ups on file.   Orders:  No orders of the defined types were placed in this encounter.  No orders of the defined types were placed in this encounter.     Procedures: Large Joint Inj: R knee on 07/18/2023 5:23 PM Indications: diagnostic evaluation, joint swelling and pain Details: 18 G 1.5 in needle, superolateral approach  Arthrogram: No  Medications: 5 mL lidocaine 1 %; 40 mg methylPREDNISolone acetate 40 MG/ML; 4 mL bupivacaine 0.25 % Outcome: tolerated well, no immediate complications Procedure, treatment alternatives, risks and benefits explained, specific risks discussed. Consent was given by the patient. Immediately prior to procedure a time out was called to verify the correct patient, procedure, equipment, support staff and site/side marked as required. Patient was  prepped and draped in the usual sterile fashion.    Hand/UE Inj: R extensor compartment 2 for intersection syndrome on 07/18/2023 5:23 PM Indications: therapeutic Details: 25 G needle, ultrasound-guided dorsal approach Medications: 0.33 mL bupivacaine 0.25 %; 13.33 mg methylPREDNISolone acetate 40 MG/ML; 3 mL lidocaine 1 % Outcome: tolerated well, no immediate complications Procedure, treatment alternatives, risks and benefits explained, specific risks discussed. Consent was given by the patient.  Immediately prior to procedure a time out was called to verify the correct patient, procedure, equipment, support staff and site/side marked as required. Patient was prepped and draped in the usual sterile fashion.       Clinical Data: No additional findings.  Objective: Vital Signs: There were no vitals taken for this visit.  Physical Exam:  Constitutional: Patient appears well-developed HEENT:  Head: Normocephalic Eyes:EOM are normal Neck: Normal range of motion Cardiovascular: Normal rate Pulmonary/chest: Effort normal Neurologic: Patient is alert Skin: Skin is warm Psychiatric: Patient has normal mood and affect  Ortho Exam: Ortho exam demonstrates intact EPL, FPL, finger abduction.  No muscle atrophy noted in the thenar or hyperthenar regions.  She has minimal tenderness over the A1 pulley of the right thumb.  She has mild tenderness over the radial styloid and first extensor compartment and more moderate tenderness over the second extensor compartment.  She has negative Finkelstein's test.  Negative CMC grind test.  There is no crepitus noted with passive motion of the thumb.  There is no triggering that is felt or observed on exam today.  Does have some pain reproduced with resisted wrist extension.  Right knee without cellulitis or skin changes.  No significant effusion noted.  No calf tenderness.  Negative Homans' sign.  Able to perform straight leg raise without extensor lag.  Specialty Comments:  Narrative & Impression CLINICAL DATA:  evaluation persistant right radicular pain   EXAM: MRI CERVICAL SPINE WITHOUT CONTRAST   TECHNIQUE: Multiplanar, multisequence MR imaging of the cervical spine was performed. No intravenous contrast was administered.   COMPARISON:  None Available.   FINDINGS: Alignment: Straightening.  No substantial sagittal subluxation.   Vertebrae: No fracture, evidence of discitis, or bone lesion.   Cord: Normal cord signal.   Posterior  Fossa, vertebral arteries, paraspinal tissues: Visualized vertebral artery flow voids are maintained. No evidence of acute abnormality in the visualized posterior fossa.   Disc levels:   C2-C3: No significant disc protrusion, foraminal stenosis, or canal stenosis.   C3-C4: No significant disc protrusion, foraminal stenosis, or canal stenosis.   C4-C5: Left eccentric posterior disc osteophyte complex with left greater than right facet and uncovertebral hypertrophy. Resulting mild to moderate left foraminal stenosis and mild canal stenosis. Patent right foramen.   C5-C6: Posterior disc osteophyte complex with bilateral facet and uncovertebral hypertrophy. Resulting mild left greater than right foraminal stenosis. Mild canal stenosis.   C6-C7: Posterior disc osteophyte complex with severe bilateral facet uncovertebral hypertrophy. Resulting severe bilateral foraminal stenosis and moderate canal stenosis.   C7-T1: No significant disc protrusion, foraminal stenosis, or canal stenosis.   IMPRESSION: 1. At C6-C7, severe bilateral foraminal stenosis and moderate canal stenosis. 2. At C4-C5, mild to moderate left foraminal stenosis and mild canal stenosis. 3. At C5-C6, mild bilateral foraminal stenosis and mild canal stenosis.     Electronically Signed   By: Feliberto Harts M.D.   On: 11/20/2022 13:31  Imaging: No results found.   PMFS History: Patient Active Problem List   Diagnosis Date Noted   Radiculopathy, cervical  region 01/26/2023   History of adenomatous polyp of colon 01/17/2022   Constipation 01/17/2022   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   Thrombocytopenia (HCC) 09/09/2021   Iron deficiency anemia due to chronic blood loss 06/24/2021   Status post bronchoscopy 04/20/2021   Pulmonary nodule 1 cm or greater in diameter 03/17/2021   Elevated levels of transaminase & lactic acid dehydrogenase 08/03/2020   Epigastric pain 08/03/2020   Liver nodule  08/03/2020   Obstruction of bile duct 08/03/2020   Cirrhosis of liver (HCC) 07/29/2020   Gastroparesis 07/29/2020   Irritable bowel syndrome 07/29/2020   Steatosis of liver 07/29/2020   MDD (major depressive disorder), recurrent episode, severe (HCC) 04/01/2020   Borderline personality disorder (HCC) 04/01/2020   Intermittent explosive disorder in adult 04/01/2020   Cholelithiasis with chronic cholecystitis 09/06/2017   Multinodular goiter (nontoxic) 11/17/2015   Diabetes mellitus without complication (HCC) 07/19/2014   GERD (gastroesophageal reflux disease) 07/19/2014   HLD (hyperlipidemia) 02/09/2010   Obstructive sleep apnea 02/09/2010   Allergic rhinitis 02/09/2010   History of uterine cancer 02/09/2010   Past Medical History:  Diagnosis Date   Adenocarcinoma (HCC)    endometrial, FIGO GRADE 1   Allergic rhinitis    Arthritis    Atypical chest pain    History of   Borderline personality disorder (HCC)    Cirrhosis (HCC)    Depression    Elevated liver enzymes    GERD (gastroesophageal reflux disease)    Hematuria    History of endometrial cancer 08-02-2009   oncologist-  dr brewster/ Andrey Farmer and dr kinard/  no recurrence   endometrial adenocarinoma Stage 1B, Grade 1, FIGO--  s/p  TAH w/ BSO and pelvic lymph node dissection's and radiation therapy   History of kidney stones    History of radiation therapy    2011  pelvic intracavity brachytherapy treatment's for endometrial carcinoma   History of thyroid nodule    multinodular goiter s/p  total thyroidectomy 11-19-2015  per pathology -  adenomatoid nodules   Hyperlipidemia    Hypothyroidism, postsurgical    Insulin dependent diabetes mellitus    Type 2   Left ureteral stone    Obesity    OSA (obstructive sleep apnea)    sleep study 2017 showed OSA negative (pt had OSA in 2011, and lost weight, no issues since)   Overactive bladder    Personality disorder (HCC)    Polyphagia(783.6)    PONV (postoperative nausea and  vomiting)    after ear surgery only one time   Right lower quadrant pain    Umbilical hernia    Urgency of urination    UTI (urinary tract infection)     Family History  Problem Relation Age of Onset   Heart disease Sister    Heart attack Brother    Heart disease Brother    Heart disease Sister    Diabetes Sister    Breast cancer Sister    Asthma Mother    Heart disease Mother    Diabetes Mother    Emphysema Mother    Hypertension Mother    Stroke Mother    Prostate cancer Father     Past Surgical History:  Procedure Laterality Date   ANTERIOR CERVICAL DECOMP/DISCECTOMY FUSION N/A 01/26/2023   Procedure: C5-6, C6-7 ANTERIOR CERVICAL DECOMPRESSION/DISCECTOMY FUSION 2 LEVELS;  Surgeon: London Sheer, MD;  Location: [facility];  Service: Orthopedics;  Laterality: N/A;   BIOPSY  04/25/2022   Procedure: BIOPSY;  Surgeon: Kerin Salen, MD;  Location: [facility];  Service: Gastroenterology;;   BIOPSY  04/10/2023   Procedure: BIOPSY;  Surgeon: Kerin Salen, MD;  Location: [facility];  Service: Gastroenterology;;   BRONCHIAL BIOPSY  04/18/2021   Procedure: BRONCHIAL BIOPSIES;  Surgeon: Leslye Peer, MD;  Location[facility] ENDOSCOPY;  Service: Pulmonary;;   BRONCHIAL BRUSHINGS  04/18/2021   Procedure: BRONCHIAL BRUSHINGS;  Surgeon: Leslye Peer, MD;  Location: [facility] ENDOSCOPY;  Service: Pulmonary;;   BRONCHIAL NEEDLE ASPIRATION BIOPSY  04/18/2021   Procedure: BRONCHIAL NEEDLE ASPIRATION BIOPSIES;  Surgeon: Leslye Peer, MD;  Location: [facility] ENDOSCOPY;  Service: Pulmonary;;   CARDIOVASCULAR STRESS TEST  06/09/2008   normal nuclear study w/ no ischemia/  normal LV function and wall motion , ef 83%   CHOLECYSTECTOMY N/A 09/06/2017   Procedure: LAPAROSCOPIC CHOLECYSTECTOMY WITH INTRAOPERATIVE CHOLANGIOGRAM;  Surgeon: Darnell Level, MD;  Location: [facility] ORS;  Service: General;  Laterality: N/A;   COLONOSCOPY WITH PROPOFOL N/A 04/25/2022   Procedure: COLONOSCOPY WITH PROPOFOL;  Surgeon: Kerin Salen,  MD;  Location: [facility];  Service: Gastroenterology;  Laterality: N/A;   CYSTOSCOPY/RETROGRADE/URETEROSCOPY/STONE EXTRACTION WITH BASKET Left 03/08/2016   Procedure: CYSTOSCOPY/RETROGRADE/URETEROSCOPY/STONE EXTRACTION WITH BASKET, STENT PLACEMENT;  Surgeon: Barron Alvine, MD;  Location: [facility];  Service: Urology;  Laterality: Left;   ENDOMETRIAL BIOPSY     ERCP N/A 09/07/2017   Procedure: ENDOSCOPIC RETROGRADE CHOLANGIOPANCREATOGRAPHY (ERCP);  Surgeon: Kerin Salen, MD;  Location: [facility] ENDOSCOPY;  Service: Gastroenterology;  Laterality: N/A;   ESOPHAGEAL BANDING  04/10/2023   Procedure: ESOPHAGEAL BANDING;  Surgeon: Kerin Salen, MD;  Location: [facility];  Service: Gastroenterology;;   ESOPHAGOGASTRODUODENOSCOPY N/A 04/25/2022   Procedure: ESOPHAGOGASTRODUODENOSCOPY (EGD);  Surgeon: Kerin Salen, MD;  Location: [facility] ENDOSCOPY;  Service: Gastroenterology;  Laterality: N/A;   ESOPHAGOGASTRODUODENOSCOPY N/A 04/10/2023   Procedure: ESOPHAGOGASTRODUODENOSCOPY (EGD);  Surgeon: Kerin Salen, MD;  Location: [facility] ENDOSCOPY;  Service: Gastroenterology;  Laterality: N/A;  with possible banding for esophageal varices   ESOPHAGOGASTRODUODENOSCOPY (EGD) WITH PROPOFOL N/A 03/16/2020   Procedure: ESOPHAGOGASTRODUODENOSCOPY (EGD) WITH PROPOFOL;  Surgeon: Kerin Salen, MD;  Location: [facility];  Service: Gastroenterology;  Laterality: N/A;  unable to locate ampulla, aborted ERCP, changed to EGD   HEMOSTASIS CLIP PLACEMENT  04/25/2022   Procedure: HEMOSTASIS CLIP PLACEMENT;  Surgeon: Kerin Salen, MD;  Location: [facility];  Service: Gastroenterology;;   HEMOSTASIS CONTROL  04/25/2022   Procedure: HEMOSTASIS CONTROL;  Surgeon: Kerin Salen, MD;  Location: [facility];  Service: Gastroenterology;;   HOLMIUM LASER APPLICATION Left 03/08/2016   Procedure: HOLMIUM LASER APPLICATION;  Surgeon: Barron Alvine, MD;  Location: [facility];  Service: Urology;  Laterality: Left;   MOUTH SURGERY      MYRINGECOTMY W/ REMOVAL MIDDLE EAR CHOLESTEATOMA TYPE 1 FASICA TYMPANOPLASTY  09/13/2000   POLYPECTOMY  04/25/2022   Procedure: POLYPECTOMY;  Surgeon: Kerin Salen, MD;  Location: [facility];  Service: Gastroenterology;;   ROBOTIC ASSISTED TOTAL HYSTERECTOMY WITH BILATERAL SALPINGO OOPHERECTOMY  08-02-2009   at [facility]  dr Andrey Farmer   w/  Bilateral pelvic and para aortic lymph node dissection's   THYROIDECTOMY N/A 11/19/2015   Procedure: TOTAL THYROIDECTOMY;  Surgeon: Darnell Level, MD;  Location: [facility];  Service: General;  Laterality: N/A;   TONSILLECTOMY  age 22   TRANSTHORACIC ECHOCARDIOGRAM  07/19/2014   ef 55-60%/  trivial TR   TYMPANOPLASTY Right 1993   VIDEO BRONCHOSCOPY WITH ENDOBRONCHIAL NAVIGATION Left 04/18/2021   Procedure: ROBOTIC ASSISTED BRONCHOSCOPY WITH ENDOBRONCHIAL NAVIGATION;  Surgeon: Leslye Peer, MD;  Location: [facility] ENDOSCOPY;  Service: Pulmonary;  Laterality: Left;   VIDEO BRONCHOSCOPY WITH RADIAL ENDOBRONCHIAL ULTRASOUND  04/18/2021   Procedure: RADIAL ENDOBRONCHIAL ULTRASOUND;  Surgeon: Leslye Peer, MD;  Location: [facility] ENDOSCOPY;  Service: Pulmonary;;   Social History   Occupational History   Occupation: n/a  Tobacco Use   Smoking status: Never   Smokeless tobacco: Never  Vaping Use   Vaping status: Never Used  Substance and Sexual Activity   Alcohol use: No   Drug use: No   Sexual activity: Yes    Birth control/protection: None

## 2023-07-19 NOTE — Progress Notes (Signed)
  Subjective:  Patient ID: Rhonda Higgins, female    DOB: 1971-04-24,  MRN: [id number]  53 y.o. female presents at risk foot care with history of diabetic neuropathy and painful mycotic toenails x 10 which interfere with daily activities. Pain is relieved with periodic professional debridement. Patient states she will be having a procedure performed to address her kidney stones. Chief Complaint  Patient presents with   Debridement    Trim toenails-diabetic - 7.4, Dr. Lucina Mellow - LOV    New problem(s): None   PCP is Knox Royalty, MD.  Allergies  Allergen Reactions   Hydrocodone Shortness Of Breath and Itching   Tape Rash    Paper tape is ok   Wound Dressing Adhesive Rash   Pholcodine Itching   Codeine Itching    Review of Systems: Negative except as noted in the HPI.   Objective:  Rhonda Higgins is a pleasant 53 y.o. female in NAD. AAO x 3.  Vascular Examination: Vascular status intact b/l with palpable pedal pulses. CFT immediate b/l. Pedal hair present. No edema. No pain with calf compression b/l. Skin temperature gradient WNL b/l. No varicosities noted. No cyanosis or clubbing noted.  Neurological Examination: Pt has subjective symptoms of neuropathy. Sensation grossly intact b/l with 10 gram monofilament. Vibratory sensation intact b/l.  Dermatological Examination: Pedal skin with normal turgor, texture and tone b/l. No open wounds nor interdigital macerations noted. Toenails 1-5 b/l thick, discolored, elongated with subungual debris and pain on dorsal palpation. No hyperkeratotic lesions noted b/l.   Musculoskeletal Examination: Muscle strength 5/5 to b/l LE.  No pain, crepitus noted b/l. No gross pedal deformities. Patient ambulates independently without assistive aids.   Radiographs: None  Assessment:   1. Pain due to onychomycosis of toenails of both feet   2. Diabetic peripheral neuropathy associated with type 2 diabetes mellitus (HCC)     Plan:  Patient was evaluated and treated. All patient's and/or POA's questions/concerns addressed on today's visit. Toenails 1-5 debrided in length and girth without incident. Continue soft, supportive shoe gear daily. Report any pedal injuries to medical professional. Call office if there are any questions/concerns. -Continue foot and shoe inspections daily. Monitor blood glucose per PCP/Endocrinologist's recommendations. -Patient/POA to call should there be question/concern in the interim.  Return in about 3 months (around 10/09/2023).  Freddie Breech, DPM      [place] LOCATION: [address] [place] [address]                   Office [phone]   [facility] LOCATION: [address] [place], [place] [address] Office [phone]

## 2023-07-25 ENCOUNTER — Ambulatory Visit: Payer: Medicaid Other | Admitting: Orthopedic Surgery

## 2023-07-27 ENCOUNTER — Encounter (HOSPITAL_COMMUNITY): Payer: Self-pay | Admitting: Urology

## 2023-07-27 ENCOUNTER — Encounter (HOSPITAL_COMMUNITY)
Admission: RE | Disposition: A | Discharge status: Home / Self Care | Payer: Self-pay | Source: Ambulatory Visit | Attending: Urology

## 2023-07-27 ENCOUNTER — Other Ambulatory Visit: Payer: Self-pay

## 2023-07-27 ENCOUNTER — Ambulatory Visit (HOSPITAL_COMMUNITY): Payer: Medicaid Other

## 2023-07-27 ENCOUNTER — Ambulatory Visit (HOSPITAL_COMMUNITY)
Admission: RE | Admit: 2023-07-27 | Discharge: 2023-07-27 | Disposition: A | Discharge status: Home / Self Care | Payer: Medicaid Other | Source: Ambulatory Visit | Attending: Urology | Admitting: Urology

## 2023-07-27 ENCOUNTER — Ambulatory Visit (HOSPITAL_BASED_OUTPATIENT_CLINIC_OR_DEPARTMENT_OTHER): Payer: Medicaid Other | Admitting: Medical

## 2023-07-27 ENCOUNTER — Ambulatory Visit (HOSPITAL_COMMUNITY): Payer: Medicaid Other | Admitting: Medical

## 2023-07-27 DIAGNOSIS — G4733 Obstructive sleep apnea (adult) (pediatric): Secondary | ICD-10-CM

## 2023-07-27 DIAGNOSIS — Z794 Long term (current) use of insulin: Secondary | ICD-10-CM | POA: Diagnosis not present

## 2023-07-27 DIAGNOSIS — Z79890 Hormone replacement therapy (postmenopausal): Secondary | ICD-10-CM | POA: Diagnosis not present

## 2023-07-27 DIAGNOSIS — N200 Calculus of kidney: Secondary | ICD-10-CM | POA: Diagnosis present

## 2023-07-27 DIAGNOSIS — Z7984 Long term (current) use of oral hypoglycemic drugs: Secondary | ICD-10-CM | POA: Insufficient documentation

## 2023-07-27 DIAGNOSIS — E119 Type 2 diabetes mellitus without complications: Secondary | ICD-10-CM | POA: Insufficient documentation

## 2023-07-27 DIAGNOSIS — Z7985 Long-term (current) use of injectable non-insulin antidiabetic drugs: Secondary | ICD-10-CM | POA: Diagnosis not present

## 2023-07-27 DIAGNOSIS — E039 Hypothyroidism, unspecified: Secondary | ICD-10-CM | POA: Insufficient documentation

## 2023-07-27 DIAGNOSIS — Z79899 Other long term (current) drug therapy: Secondary | ICD-10-CM | POA: Diagnosis not present

## 2023-07-27 DIAGNOSIS — E785 Hyperlipidemia, unspecified: Secondary | ICD-10-CM | POA: Diagnosis not present

## 2023-07-27 DIAGNOSIS — K219 Gastro-esophageal reflux disease without esophagitis: Secondary | ICD-10-CM | POA: Diagnosis not present

## 2023-07-27 DIAGNOSIS — F32A Depression, unspecified: Secondary | ICD-10-CM | POA: Diagnosis not present

## 2023-07-27 HISTORY — PX: CYSTOSCOPY/URETEROSCOPY/HOLMIUM LASER/STENT PLACEMENT: SHX6546

## 2023-07-27 LAB — GLUCOSE, CAPILLARY
Glucose-Capillary: 112 mg/dL — ABNORMAL HIGH (ref 70–99)
Glucose-Capillary: 95 mg/dL (ref 70–99)

## 2023-07-27 SURGERY — CYSTOSCOPY/URETEROSCOPY/HOLMIUM LASER/STENT PLACEMENT
Anesthesia: General | Site: Ureter | Laterality: Left

## 2023-07-27 MED ORDER — HYDROMORPHONE HCL 1 MG/ML IJ SOLN
0.2500 mg | INTRAMUSCULAR | Status: DC | PRN
  Administered 2023-07-27 (×4): 0.5 mg via INTRAVENOUS

## 2023-07-27 MED ORDER — HYDROMORPHONE HCL 1 MG/ML IJ SOLN
INTRAMUSCULAR | Status: AC
  Filled 2023-07-27: qty 1

## 2023-07-27 MED ORDER — FENTANYL CITRATE (PF) 100 MCG/2ML IJ SOLN
INTRAMUSCULAR | Status: AC
  Filled 2023-07-27: qty 2

## 2023-07-27 MED ORDER — OXYCODONE HCL 5 MG PO TABS
5.0000 mg | ORAL_TABLET | Freq: Once | ORAL | Status: AC | PRN
  Administered 2023-07-27: 5 mg via ORAL

## 2023-07-27 MED ORDER — DEXAMETHASONE SODIUM PHOSPHATE 10 MG/ML IJ SOLN
INTRAMUSCULAR | Status: DC | PRN
  Administered 2023-07-27: 4 mg via INTRAVENOUS

## 2023-07-27 MED ORDER — MIDAZOLAM HCL 5 MG/5ML IJ SOLN
INTRAMUSCULAR | Status: DC | PRN
  Administered 2023-07-27: 2 mg via INTRAVENOUS

## 2023-07-27 MED ORDER — TAMSULOSIN HCL 0.4 MG PO CAPS
0.4000 mg | ORAL_CAPSULE | Freq: Every day | ORAL | 1 refills | Status: DC
Start: 2023-07-27 — End: 2024-03-12

## 2023-07-27 MED ORDER — DEXMEDETOMIDINE HCL IN NACL 80 MCG/20ML IV SOLN
INTRAVENOUS | Status: AC
  Filled 2023-07-27: qty 20

## 2023-07-27 MED ORDER — IOHEXOL 300 MG/ML  SOLN
INTRAMUSCULAR | Status: DC | PRN
  Administered 2023-07-27: 6 mL

## 2023-07-27 MED ORDER — OXYCODONE HCL 5 MG/5ML PO SOLN: 5.0000 mg | Freq: Once | ORAL | Status: AC | PRN

## 2023-07-27 MED ORDER — CHLORHEXIDINE GLUCONATE 0.12 % MT SOLN: 15.0000 mL | Freq: Once | OROMUCOSAL | Status: DC

## 2023-07-27 MED ORDER — DEXMEDETOMIDINE HCL IN NACL 80 MCG/20ML IV SOLN
INTRAVENOUS | Status: DC | PRN
  Administered 2023-07-27: 8 ug via INTRAVENOUS

## 2023-07-27 MED ORDER — MIDAZOLAM HCL 2 MG/2ML IJ SOLN
INTRAMUSCULAR | Status: AC
  Filled 2023-07-27: qty 2

## 2023-07-27 MED ORDER — SODIUM CHLORIDE 0.9 % IR SOLN
Status: DC | PRN
Start: 2023-07-27 — End: 2023-07-27
  Administered 2023-07-27: 3000 mL via INTRAVESICAL

## 2023-07-27 MED ORDER — OXYCODONE HCL 5 MG PO TABS
ORAL_TABLET | ORAL | Status: AC
  Filled 2023-07-27: qty 1

## 2023-07-27 MED ORDER — PHENYLEPHRINE 80 MCG/ML (10ML) SYRINGE FOR IV PUSH (FOR BLOOD PRESSURE SUPPORT)
PREFILLED_SYRINGE | INTRAVENOUS | Status: AC
Start: 2023-07-27 — End: ?
  Filled 2023-07-27: qty 10

## 2023-07-27 MED ORDER — ORAL CARE MOUTH RINSE: 15.0000 mL | Freq: Once | OROMUCOSAL | Status: DC

## 2023-07-27 MED ORDER — AMISULPRIDE (ANTIEMETIC) 5 MG/2ML IV SOLN: 10.0000 mg | Freq: Once | INTRAVENOUS | Status: DC | PRN

## 2023-07-27 MED ORDER — PROPOFOL 10 MG/ML IV BOLUS
INTRAVENOUS | Status: DC | PRN
  Administered 2023-07-27: 200 mg via INTRAVENOUS

## 2023-07-27 MED ORDER — LACTATED RINGERS IV SOLN: INTRAVENOUS | Status: DC

## 2023-07-27 MED ORDER — PHENYLEPHRINE HCL (PRESSORS) 10 MG/ML IV SOLN
INTRAVENOUS | Status: DC | PRN
  Administered 2023-07-27: 100 ug via INTRAVENOUS

## 2023-07-27 MED ORDER — TRAMADOL HCL 50 MG PO TABS
50.0000 mg | ORAL_TABLET | Freq: Four times a day (QID) | ORAL | 0 refills | Status: DC | PRN
Start: 2023-07-27 — End: 2023-08-17

## 2023-07-27 MED ORDER — LIDOCAINE HCL (CARDIAC) PF 100 MG/5ML IV SOSY
PREFILLED_SYRINGE | INTRAVENOUS | Status: DC | PRN
  Administered 2023-07-27: 100 mg via INTRAVENOUS

## 2023-07-27 MED ORDER — FENTANYL CITRATE (PF) 100 MCG/2ML IJ SOLN
INTRAMUSCULAR | Status: DC | PRN
  Administered 2023-07-27 (×4): 50 ug via INTRAVENOUS

## 2023-07-27 MED ORDER — ONDANSETRON HCL 4 MG/2ML IJ SOLN: 4.0000 mg | Freq: Once | INTRAMUSCULAR | Status: DC | PRN

## 2023-07-27 MED ORDER — ACETAMINOPHEN 10 MG/ML IV SOLN: 1000.0000 mg | Freq: Once | INTRAVENOUS | Status: DC | PRN

## 2023-07-27 MED ORDER — CEFAZOLIN SODIUM-DEXTROSE 2-4 GM/100ML-% IV SOLN
2.0000 g | INTRAVENOUS | Status: DC
  Filled 2023-07-27: qty 100

## 2023-07-27 SURGICAL SUPPLY — 22 items
BAG URO CATCHER STRL LF (MISCELLANEOUS) ×1 IMPLANT
BASKET LASER NITINOL 1.9FR (BASKET) IMPLANT
BASKET ZERO TIP NITINOL 2.4FR (BASKET) IMPLANT
CATH URETERAL DUAL LUMEN 10F (MISCELLANEOUS) IMPLANT
CATH URETL OPEN END 6FR 70 (CATHETERS) ×1 IMPLANT
CLOTH BEACON ORANGE TIMEOUT ST (SAFETY) ×1 IMPLANT
EXTRACTOR STONE 1.7FRX115CM (UROLOGICAL SUPPLIES) IMPLANT
GLOVE BIO SURGEON STRL SZ7.5 (GLOVE) ×1 IMPLANT
GOWN STRL REUS W/ TWL XL LVL3 (GOWN DISPOSABLE) ×1 IMPLANT
GUIDEWIRE ANG ZIPWIRE 038X150 (WIRE) IMPLANT
GUIDEWIRE STR DUAL SENSOR (WIRE) ×1 IMPLANT
KIT TURNOVER KIT A (KITS) IMPLANT
LASER FIB FLEXIVA PULSE ID 365 (Laser) IMPLANT
MANIFOLD NEPTUNE II (INSTRUMENTS) ×1 IMPLANT
PACK CYSTO (CUSTOM PROCEDURE TRAY) ×1 IMPLANT
SHEATH NAVIGATOR HD 11/13X28 (SHEATH) IMPLANT
SHEATH NAVIGATOR HD 11/13X36 (SHEATH) IMPLANT
STENT URET 6FRX24 CONTOUR (STENTS) IMPLANT
TRACTIP FLEXIVA PULS ID 200XHI (Laser) IMPLANT
TRACTIP FLEXIVA PULSE ID 200 (Laser) IMPLANT
TUBING CONNECTING 10 (TUBING) ×1 IMPLANT
TUBING UROLOGY SET (TUBING) ×1 IMPLANT

## 2023-07-27 NOTE — Op Note (Signed)
 Operative Note  Preoperative diagnosis:  1.  Left renal calculus  Postoperative diagnosis: 1.  Left renal calculus  Procedure(s): 1.  Possibly with left retrograde pyelogram, left ureteroscopy with laser lithotripsy and ureteral stent placement  Surgeon: Sherwood Edison, MD  Assistants: None  Anesthesia: General  Complications: None immediate  EBL: Minimal  Specimens: 1.  None  Drains/Catheters: 1.  6 x 24 double-J ureteral stent  Intraoperative findings: 1.  Normal urethra and bladder 2.  Left ureteroscopy revealed a dominant stone with some smaller stones that were laser fragmented on dust settings to tiny fragments.  All stones treated within the kidney.    3.  Retrograde pyelogram at the conclusion of the case showed no filling defect and no hydronephrosis.  Indication: 53 year old female with a left renal calculus presents for the previously mentioned operation.  Description of procedure:  The patient was identified and consent was obtained.  The patient was taken to the operating room and placed in the supine position.  The patient was placed under general anesthesia.  Perioperative antibiotics were administered.  The patient was placed in dorsal lithotomy.  Patient was prepped and draped in a standard sterile fashion and a timeout was performed.  A 21 French rigid cystoscope was advanced into the urethra and into the bladder.  Complete cystoscopy was performed with no abnormal findings.  The left ureter was cannulated with a sensor wire which was advanced up to the kidney under fluoroscopic guidance.  A second wire was advanced alongside this into the kidney.  1 the wires was secured to the drape and the other wire was used to advance a 11 x 13 ureteral access sheath over the wire under continuous fluoroscopic guidance to the proximal ureter.  Inner sheath and wire were withdrawn.  Digital ureteroscopy was performed and the stone was encountered which was laser fragmented all  dust settings to tiny fragments.  All stones were treated.  I shot a retrograde pyelogram through the scope with findings noted above.  I withdrew the scope along with the access sheath visualizing the ureter upon removal.  There were no ureteral calculi and no ureteral injury was identified.  I backloaded the wire onto rigid cystoscope and advanced that into the bladder followed by routine placement of a 6 x 24 double-J ureteral stent.  Fluoroscopy confirmed proximal placement and direct visualization confirmed a good coil within the bladder.  I drained the bladder withdrew the scope.  Patient tolerated the procedure well was stable postoperatively.  Plan: Follow-up in 1 week for stent removal

## 2023-07-27 NOTE — H&P (Signed)
 CC/HPI: CC: Left renal calculi  HPI:  06/27/2023  53 year old female with a history of nephrolithiasis. She has required ureteroscopy in the past. Went to the emergency department on the fifth with left sided abdominal pain and was found to have multiple nonobstructing left renal calculi measuring up to 1 cm. Pain has resolved. She denies any hematuria or dysuria.     ALLERGIES: Adhesive tape Codeine Derivatives - Trouble Breathing, Itching Myrbetriq TB24 - Other Reaction, sores in mouth Vicodin TABS - Itching    MEDICATIONS: Lisinopril   Metformin  Hcl  Zyrtec  Acetaminophen   Atorvastatin  Calcium   Gabapentin   Lantus   Meloxicam   Ondansetron  Hcl  Ozempic  Pantoprazole  Sodium  Risperidone   Synthroid   Trazodone  Hcl  Vitamin D     GU PSH: Cystoscopy - 2021 Hysterectomy Unilat SO - 2012 Ureteroscopic laser litho, Left - 2017       PSH Notes: Ear Surgery     NON-GU PSH: Cholecystectomy (laparoscopic) - 2019 Thyroid  Surgery, thyroidectomy w/goiter - 2017     GU PMH: Microscopic hematuria - 05/08/2023, benign eval. Additional abx capsule to take in 8 hours. , - 2021 Renal calculus - 05/08/2023, US  in 6 mo. Disc diet changes to prevent stones., - 2021      PMH Notes:  2011-04-17 14:16:42 - Note: Chronic Reflux Esophagitis  2011-04-17 14:16:42 - Note: Uterine Cancer   NON-GU PMH: Renal leak hypercalciuria - 05/08/2023 Anxiety Arthritis Depression Diabetes Type 2 GERD Hypercholesterolemia Liver Disease    FAMILY HISTORY: Diabetes - Mother Family Health Status Number - No Family History Father Deceased At Age41 ___ - Runs In Family Heart Disease - Mother, Sister Kidney Stones - Runs In Family Mother Deceased At Age 103 from diabetic complicati - Runs In Family Myocardial Infarction - Runs in Family Prostate Cancer - Father stroke - Runs in Family   SOCIAL HISTORY: Marital Status: Single Preferred Language: English; Ethnicity: Not Hispanic Or Latino; Race:  White Current Smoking Status: Patient has never smoked.  Has never drank.  Drinks 1 caffeinated drink per day. Patient's occupation is/was unemployed.    REVIEW OF SYSTEMS:    GU Review Female:   Patient reports frequent urination, get up at night to urinate, and leakage of urine. Patient denies hard to postpone urination, burning /pain with urination, stream starts and stops, trouble starting your stream, have to strain to urinate, and being pregnant.  Gastrointestinal (Upper):   Patient reports nausea and indigestion/ heartburn. Patient denies vomiting.  Gastrointestinal (Lower):   Patient denies diarrhea and constipation.  Constitutional:   Patient reports fatigue. Patient denies fever, night sweats, and weight loss.  Skin:   Patient denies skin rash/ lesion and itching.  Eyes:   Patient denies blurred vision and double vision.  Ears/ Nose/ Throat:   Patient denies sore throat and sinus problems.  Hematologic/Lymphatic:   Patient denies swollen glands and easy bruising.  Cardiovascular:   Patient denies leg swelling and chest pains.  Respiratory:   Patient denies cough and shortness of breath.  Endocrine:   Patient denies excessive thirst.  Musculoskeletal:   Patient reports back pain and joint pain.   Neurological:   Patient reports dizziness. Patient denies headaches.  Psychologic:   Patient denies depression and anxiety.   VITAL SIGNS:      06/27/2023 01:04 PM  Weight 210 lb / 95.25 kg  Height 63 in / 160.02 cm  BP 153/84 mmHg  Heart Rate 67 /min  Temperature 98.1 F / 36.7 C  BMI 37.2 kg/m  MULTI-SYSTEM PHYSICAL EXAMINATION:    Gastrointestinal: Obese abdomen. No mass, no tenderness, no rigidity.      Complexity of Data:  Source Of History:  Patient  Records Review:   Previous Doctor Records, Previous Patient Records  Urine Test Review:   Urinalysis  X-Ray Review: C.T. Abdomen/Pelvis: Reviewed Films. Reviewed Report. Discussed With Patient.     PROCEDURES:          KUB - [id number]  A single view of the abdomen is obtained.  Multiple left renal calculi redemonstrated      . Patient confirmed No Neulasta OnPro Device.           Urinalysis w/Scope Dipstick Dipstick Cont'd Micro  Color: Yellow Bilirubin: Neg mg/dL WBC/hpf: 10 - 79/yeq  Appearance: Clear Ketones: Neg mg/dL RBC/hpf: 0 - 2/hpf  Specific Gravity: 1.020 Blood: 1+ ery/uL Bacteria: Few (10-25/hpf)  pH: 7.0 Protein: 1+ mg/dL Cystals: NS (Not Seen)  Glucose: Neg mg/dL Urobilinogen: 0.2 mg/dL Casts: NS (Not Seen)    Nitrites: Neg Trichomonas: Not Present    Leukocyte Esterase: 1+ leu/uL Mucous: Present      Epithelial Cells: 0 - 5/hpf      Yeast: NS (Not Seen)      Sperm: Not Present    ASSESSMENT:      ICD-10 Details  1 GU:   Renal calculus - N20.0 Undiagnosed New Problem   PLAN:           Orders Labs Urine Culture          Document Letter(s):  Created for Patient: Clinical Summary         Notes:   We discussed the management of urinary stones. These options include observation, ureteroscopy, and shockwave lithotripsy. We discussed which options are relevant to these particular stones. We discussed the natural history of stones as well as the complications of untreated stones and the impact on quality of life without treatment as well as with each of the above listed treatments. We also discussed the efficacy of each treatment in its ability to clear the stone burden. With any of these management options I discussed the signs and symptoms of infection and the need for emergent treatment should these be experienced. For each option we discussed the ability of each procedure to clear the patient of their stone burden.   For observation I described the risks which include but are not limited to silent renal damage, life-threatening infection, need for emergent surgery, failure to pass stone, and pain.   For ureteroscopy I described the risks which include heart attack, stroke, pulmonary  embolus, death, bleeding, infection, damage to contiguous structures, positioning injury, ureteral stricture, ureteral avulsion, ureteral injury, need for ureteral stent, inability to perform ureteroscopy, need for an interval procedure, inability to clear stone burden, stent discomfort and pain.   For shockwave lithotripsy I described the risks which include arrhythmia, kidney contusion, kidney hemorrhage, need for transfusion, pain, inability to break up stone, inability to pass stone fragments, Steinstrasse, infection associated with obstructing stones, need for different surgical procedure, need for repeat shockwave lithotripsy.   She would like to proceed with ureteroscopy   CC: Dr. Joshua    Signed by Sherwood Edison, III, M.D. on 06/27/23 at 5:09 PM (EST

## 2023-07-27 NOTE — Anesthesia Procedure Notes (Signed)
 Procedure Name: LMA Insertion Date/Time: 07/27/2023 2:13 PM  Performed by: Harl Armida PARAS, CRNAPre-anesthesia Checklist: Patient identified, Emergency Drugs available, Suction available and Patient being monitored Patient Re-evaluated:Patient Re-evaluated prior to induction Preoxygenation: Pre-oxygenation with 100% oxygen Induction Type: IV induction LMA: LMA inserted LMA Size: 4.0 Number of attempts: 1 Placement Confirmation: positive ETCO2 Tube secured with: Tape Dental Injury: Teeth and Oropharynx as per pre-operative assessment

## 2023-07-27 NOTE — Transfer of Care (Signed)
 Immediate Anesthesia Transfer of Care Note  Patient: Rhonda Higgins  Procedure(s) Performed: CYSTOSCOPYLEFT/URETEROSCOPY/HOLMIUM LASER/STENT PLACEMENT (Left: Ureter)  Patient Location: PACU  Anesthesia Type:General  Level of Consciousness: awake, alert , and oriented  Airway & Oxygen Therapy: Patient Spontanous Breathing and Patient connected to face mask oxygen  Post-op Assessment: Report given to RN and Post -op Vital signs reviewed and stable  Post vital signs: Reviewed and stable  Last Vitals:  Vitals Value Taken Time  BP 128/85 07/27/23 1500  Temp    Pulse 99 07/27/23 1500  Resp 17 07/27/23 1500  SpO2 100 % 07/27/23 1500  Vitals shown include unfiled device data.  Last Pain:  Vitals:   07/27/23 1154  TempSrc:   PainSc: 0-No pain         Complications: No notable events documented.

## 2023-07-27 NOTE — Discharge Instructions (Signed)

## 2023-07-27 NOTE — Anesthesia Postprocedure Evaluation (Signed)
 Anesthesia Post Note  Patient: Rhonda Higgins  Procedure(s) Performed: CYSTOSCOPYLEFT/URETEROSCOPY/HOLMIUM LASER/STENT PLACEMENT (Left: Ureter)     Patient location during evaluation: PACU Anesthesia Type: General Level of consciousness: awake and alert Pain management: pain level controlled Vital Signs Assessment: post-procedure vital signs reviewed and stable Respiratory status: spontaneous breathing, nonlabored ventilation, respiratory function stable and patient connected to nasal cannula oxygen Cardiovascular status: blood pressure returned to baseline and stable Postop Assessment: no apparent nausea or vomiting Anesthetic complications: no   No notable events documented.  Last Vitals:  Vitals:   07/27/23 1545 07/27/23 1600  BP: 139/85 128/84  Pulse: 91 87  Resp: 14 17  Temp:    SpO2: 93% 93%    Last Pain:  Vitals:   07/27/23 1600  TempSrc:   PainSc: (P) 0-No pain                 [name]

## 2023-07-30 ENCOUNTER — Encounter (HOSPITAL_COMMUNITY): Payer: Self-pay | Admitting: Urology

## 2023-08-06 ENCOUNTER — Ambulatory Visit: Payer: Medicaid Other | Admitting: Orthopedic Surgery

## 2023-08-10 ENCOUNTER — Ambulatory Visit
Admission: RE | Admit: 2023-08-10 | Discharge: 2023-08-10 | Disposition: A | Discharge status: Home / Self Care | Payer: Medicaid Other | Source: Ambulatory Visit | Attending: Orthopedic Surgery | Admitting: Orthopedic Surgery

## 2023-08-10 DIAGNOSIS — M5412 Radiculopathy, cervical region: Secondary | ICD-10-CM

## 2023-08-13 ENCOUNTER — Telehealth: Payer: Self-pay | Admitting: Orthopedic Surgery

## 2023-08-13 NOTE — Telephone Encounter (Signed)
 Pt called and asking for return call due to neck and head pains. Pt states its an aching pain from neck to head and pt is worried. Please call pt at [phone].

## 2023-08-13 NOTE — Telephone Encounter (Addendum)
 I called and spoke with patient, she states that she is having pain in both shoulders that radiates into the neck, over the top of the skull and stops at the bottom of the eyes. She would like to know what she needs to do about this?  She states that it started on Saturday and she did not know who to call about it.

## 2023-08-17 MED ORDER — HYDROCODONE-ACETAMINOPHEN 5-325 MG PO TABS: 1.0000 | ORAL_TABLET | Freq: Four times a day (QID) | ORAL | 0 refills | Status: AC | PRN

## 2023-08-17 NOTE — Telephone Encounter (Signed)
Lmom for pt to call me back. 

## 2023-08-17 NOTE — Telephone Encounter (Signed)
 I called and spoke with patient and she would like to get the pain meds till her appt on Monday

## 2023-08-20 ENCOUNTER — Ambulatory Visit: Payer: Medicaid Other | Admitting: Orthopedic Surgery

## 2023-08-20 DIAGNOSIS — S73192A Other sprain of left hip, initial encounter: Secondary | ICD-10-CM | POA: Diagnosis not present

## 2023-08-20 MED ORDER — TIZANIDINE HCL 4 MG PO TABS: 4.0000 mg | ORAL_TABLET | Freq: Three times a day (TID) | ORAL | 2 refills | Status: DC | PRN

## 2023-08-20 NOTE — Progress Notes (Signed)
 Orthopedic Surgery Post-operative Office Visit   Procedure: C5-7 ACDF Date of Surgery: 01/26/2023 (~7 months post-op)   Assessment: Patient is a 53 y.o. female who is now experiencing neck pain that goes up into her base of her skull and into the top of her head.  Not having any radiating arm pain.  Also reporting left hip pain with a positive FADIR, possible labral tear     Plan: -Patient has tried tylenol, gabapentin, tramadol, cervical injection since onset of this right arm pain -She has been taking a lot of Tylenol and has been experiencing headaches, so this could be rebound headache.  I encouraged her to cut down on the Tylenol.  I prescribed tizanidine to try and help her with that.  She can continue to use Norco sparingly -Referred her to PT for her hip -If she is not doing any better at her next visit, we will order an MRI of the left hip to evaluate for labral tear -Return to office in 6 weeks, films needed at next visit: left hip AP and lateral   ___________________________________________________________________________     Subjective: Patient has noticed neck pain that radiates into the base of her skull and to the top of her head since she was last seen in the office.  She is no longer having any radiating arm pain.  She says that her pain is gradually improving.  She has been using Tylenol for her pain.  She says she occasionally uses Norco.  She has not noticed any weakness in her upper extremities.  No trouble with fine motor skills in the hands.  No unsteadiness with gait.  She also has been having left hip pain.  She says she feels it when she is up and walking.  She notices it particularly when she is getting up out of chair.  She feels it in the groin area of her left hip.  She has no pain radiating into the right lower extremity.  No radiating pain past the hip on the left side.  There is no trauma or injury that preceded the onset of this pain.  Objective:   General:  no acute distress, appropriate affect Neurologic: alert, answering questions appropriately, following commands Respiratory: unlabored breathing on room air Skin: incision is well healed   MSK (spine):   -Strength exam                                                   Left                  Right Grip strength                5/5                  5/5 Interosseus                  5/5                  5/5 Wrist extension            5/5                  5/5 Wrist flexion                 5/5  5/5 Elbow flexion                5/5                  5/5 Deltoid                          5/5                  5/5   -Sensory exam                           Sensation intact to light touch in C5-T1 nerve distributions of bilateral upper extremities  -Left hip exam: Positive Stinchfield, positive FADIR, no pain through remainder of range of motion, negative FABER, negative SI joint compression test     Imaging: XRs of the cervical spine taken 05/23/2023 were previously independently reviewed and interpreted, showing interbody allografts at C5/6 and C6/7 that appear in appropriate position. There is anterior plate with screws from C5-7. No lucency seen around the screws.  On flexion/extension views, the interspinous distance changes 1 mm or less at C5/6 and C6/7.  The interspinous distance at C4/5 changes greater than 4 mm between flexion/extension views.  No fracture or dislocation seen.    MRI of the cervical spine from 08/10/2023 was independently reviewed and interpreted, showing disc bulge at C4/5 with ventral effacement of the thecal sac.  CSF seen posterior to the cord at that level.  Foraminal stenosis on the left at C6/7.  No other significant stenosis seen.  No T2 cord signal change seen.   Patient name: Rhonda Higgins [place]arth Patient MRN: [id number] Date of visit: 08/20/23

## 2023-08-21 ENCOUNTER — Inpatient Hospital Stay: Payer: Medicaid Other | Attending: Internal Medicine

## 2023-08-21 DIAGNOSIS — D509 Iron deficiency anemia, unspecified: Secondary | ICD-10-CM | POA: Diagnosis present

## 2023-08-21 DIAGNOSIS — D696 Thrombocytopenia, unspecified: Secondary | ICD-10-CM

## 2023-08-21 DIAGNOSIS — D6959 Other secondary thrombocytopenia: Secondary | ICD-10-CM | POA: Diagnosis not present

## 2023-08-21 DIAGNOSIS — K7460 Unspecified cirrhosis of liver: Secondary | ICD-10-CM | POA: Diagnosis not present

## 2023-08-21 DIAGNOSIS — I8510 Secondary esophageal varices without bleeding: Secondary | ICD-10-CM | POA: Diagnosis not present

## 2023-08-21 LAB — CBC WITH DIFFERENTIAL (CANCER CENTER ONLY)
Abs Immature Granulocytes: 0.01 10*3/uL (ref 0.00–0.07)
Basophils Absolute: 0 10*3/uL (ref 0.0–0.1)
Basophils Relative: 0 %
Eosinophils Absolute: 0 10*3/uL (ref 0.0–0.5)
Eosinophils Relative: 1 %
HCT: 40 % (ref 36.0–46.0)
Hemoglobin: 12.6 g/dL (ref 12.0–15.0)
Immature Granulocytes: 0 %
Lymphocytes Relative: 30 %
Lymphs Abs: 0.9 10*3/uL (ref 0.7–4.0)
MCH: 27 pg (ref 26.0–34.0)
MCHC: 31.5 g/dL (ref 30.0–36.0)
MCV: 85.7 fL (ref 80.0–100.0)
Monocytes Absolute: 0.3 10*3/uL (ref 0.1–1.0)
Monocytes Relative: 10 %
Neutro Abs: 1.7 10*3/uL (ref 1.7–7.7)
Neutrophils Relative %: 59 %
Platelet Count: 73 10*3/uL — ABNORMAL LOW (ref 150–400)
RBC: 4.67 MIL/uL (ref 3.87–5.11)
RDW: 15.5 % (ref 11.5–15.5)
WBC Count: 2.9 10*3/uL — ABNORMAL LOW (ref 4.0–10.5)
nRBC: 0 % (ref 0.0–0.2)

## 2023-08-21 LAB — IRON AND IRON BINDING CAPACITY (CC-WL,HP ONLY)
Iron: 43 ug/dL (ref 28–170)
Saturation Ratios: 10 % — ABNORMAL LOW (ref 10.4–31.8)
TIBC: 431 ug/dL (ref 250–450)
UIBC: 388 ug/dL (ref 148–442)

## 2023-08-21 LAB — FERRITIN: Ferritin: 23 ng/mL (ref 11–307)

## 2023-08-22 ENCOUNTER — Telehealth: Payer: Self-pay | Admitting: Orthopedic Surgery

## 2023-08-22 MED ORDER — CYCLOBENZAPRINE HCL 10 MG PO TABS: 10.0000 mg | ORAL_TABLET | Freq: Three times a day (TID) | ORAL | 0 refills | Status: DC | PRN

## 2023-08-22 NOTE — Telephone Encounter (Signed)
 Pt called requesting a call back concerning muscle relaxer Dr Christell Constant. Prescribe. She states causes light headedness and sleepiness. Please call pt at [phone].

## 2023-08-22 NOTE — Telephone Encounter (Signed)
I called and advised her of this 

## 2023-08-23 ENCOUNTER — Inpatient Hospital Stay: Payer: Medicaid Other | Admitting: Hematology and Oncology

## 2023-08-23 DIAGNOSIS — D500 Iron deficiency anemia secondary to blood loss (chronic): Secondary | ICD-10-CM | POA: Diagnosis not present

## 2023-08-23 DIAGNOSIS — D696 Thrombocytopenia, unspecified: Secondary | ICD-10-CM | POA: Diagnosis not present

## 2023-08-23 NOTE — Assessment & Plan Note (Addendum)
 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 Mild thrombocytopenia: Most likely related to cirrhosis of the liver. 03/17/22: Platelet count 85    04/06/2023: Platelets 99, hemoglobin 9.5, MCV 68.5 08/21/2023: Platelets 73, hemoglobin 12.6, WBC 2.9, iron saturation 10%, ferritin 23   Slightly low B12 levels: Encouraged her to take 1000 mcg of sublingual B12   Ultrasound abdomen 04/19/2021: Cirrhosis of liver

## 2023-08-23 NOTE — Assessment & Plan Note (Addendum)
 IV iron: November 2024 Lab review: 08/21/2023: Platelets 73, hemoglobin 12.6, WBC 2.9, iron saturation 10%, ferritin 23   Recheck labs in 3 months and telephone visit after the

## 2023-08-23 NOTE — Progress Notes (Signed)
 HEMATOLOGY-ONCOLOGY TELEPHONE VISIT PROGRESS NOTE  I connected with our patient on 08/23/23 at 11:45 AM EST by telephone and verified that I am speaking with the correct person using two identifiers.  I discussed the limitations, risks, security and privacy concerns of performing an evaluation and management service by telephone and the availability of in person appointments.  I also discussed with the patient that there may be a patient responsible charge related to this service. The patient expressed understanding and agreed to proceed.   History of Present Illness: Follow-up of thrombocytopenia and deficiency anemia  History of Present Illness The patient, with a history of liver disease, presents for a follow-up visit regarding low platelet count and anemia. She had an iron infusion in November due to anemia, with a hemoglobin level of 9.5 at that time. Currently, her hemoglobin has improved to 12.6 and her ferritin level is at 23, indicating an improvement in her anemia.  Regarding her platelet count, it has been consistently low for over twelve years, typically ranging between 70 to 100. The most recent test showed a platelet count of 73, which is slightly lower than her usual range. However, this level is not concerning for bleeding unless it drops below 20. The patient is also on a B12 supplement for her condition.      REVIEW OF SYSTEMS:   Constitutional: Denies fevers, chills or abnormal weight loss All other systems were reviewed with the patient and are negative. 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 Mild thrombocytopenia: Most likely related to cirrhosis of the liver. 03/17/22: Platelet count 85    04/06/2023: Platelets 99, hemoglobin 9.5, MCV 68.5 08/21/2023: Platelets 73, hemoglobin 12.6, WBC 2.9, iron saturation 10%, ferritin 23   Slightly low B12 levels: Encouraged her to take 1000 mcg of sublingual B12   Ultrasound abdomen 04/19/2021: Cirrhosis of liver  Iron deficiency anemia due to chronic blood loss IV iron: November 2024 Lab review: 08/21/2023: Platelets 73, hemoglobin 12.6, WBC 2.9, iron saturation 10%, ferritin 23   Recheck labs in 3 months and telephone visit after the     I discussed the assessment and treatment plan with the patient. The patient was provided an opportunity to ask questions and all were answered. The patient agreed with the plan and demonstrated an understanding of the instructions. The patient was advised to call back or seek an in-person evaluation if the symptoms worsen or if the condition fails to improve as anticipated.   I provided 20 minutes of non-face-to-face time during this encounter.  This includes time for charting and coordination of care   Tamsen Meek, MD

## 2023-08-24 ENCOUNTER — Telehealth: Payer: Self-pay | Admitting: Hematology and Oncology

## 2023-08-24 NOTE — Telephone Encounter (Signed)
 Scheduled appointments per 3/6 los. Patient is aware of the made appointments.

## 2023-09-03 NOTE — Therapy (Signed)
 OUTPATIENT PHYSICAL THERAPY LOWER EXTREMITY EVALUATION   Patient Name: Rhonda Higgins Applegarth MRN: [id number] DOB:Nov 10, 1970, 53 y.o., female Today's Date: 09/05/2023  END OF SESSION:  PT End of Session - 09/05/23 0856     Visit Number 1    Number of Visits 9    Date for PT Re-Evaluation 10/31/23    Authorization Type [employer] MCD    PT Start Time 1015    PT Stop Time 1052    PT Time Calculation (min) 37 min    Activity Tolerance Patient tolerated treatment well    Behavior During Therapy WFL for tasks assessed/performed             Past Medical History:  Diagnosis Date   Adenocarcinoma (HCC)    endometrial, FIGO GRADE 1   Allergic rhinitis    Arthritis    Atypical chest pain    History of   Borderline personality disorder (HCC)    Cirrhosis (HCC)    Depression    Elevated liver enzymes    GERD (gastroesophageal reflux disease)    Hematuria    History of endometrial cancer 08-02-2009   oncologist-  dr brewster/ Andrey Farmer and dr kinard/  no recurrence   endometrial adenocarinoma Stage 1B, Grade 1, FIGO--  s/p  TAH w/ BSO and pelvic lymph node dissection's and radiation therapy   History of kidney stones    History of radiation therapy    2011  pelvic intracavity brachytherapy treatment's for endometrial carcinoma   History of thyroid nodule    multinodular goiter s/p  total thyroidectomy 11-19-2015  per pathology -  adenomatoid nodules   Hyperlipidemia    Hypothyroidism, postsurgical    Insulin dependent diabetes mellitus    Type 2   Left ureteral stone    Obesity    OSA (obstructive sleep apnea)    sleep study 2017 showed OSA negative (pt had OSA in 2011, and lost weight, no issues since)   Overactive bladder    Personality disorder (HCC)    Polyphagia(783.6)    PONV (postoperative nausea and vomiting)    after ear surgery only one time   Right lower quadrant pain    Umbilical hernia    Urgency of urination    UTI (urinary tract infection)    Past Surgical  History:  Procedure Laterality Date   ANTERIOR CERVICAL DECOMP/DISCECTOMY FUSION N/A 01/26/2023   Procedure: C5-6, C6-7 ANTERIOR CERVICAL DECOMPRESSION/DISCECTOMY FUSION 2 LEVELS;  Surgeon: London Sheer, MD;  Location: [facility];  Service: Orthopedics;  Laterality: N/A;   BIOPSY  04/25/2022   Procedure: BIOPSY;  Surgeon: Kerin Salen, MD;  Location: [facility];  Service: Gastroenterology;;   BIOPSY  04/10/2023   Procedure: BIOPSY;  Surgeon: Kerin Salen, MD;  Location: [facility];  Service: Gastroenterology;;   BRONCHIAL BIOPSY  04/18/2021   Procedure: BRONCHIAL BIOPSIES;  Surgeon: Leslye Peer, MD;  Location: [facility] ENDOSCOPY;  Service: Pulmonary;;   BRONCHIAL BRUSHINGS  04/18/2021   Procedure: BRONCHIAL BRUSHINGS;  Surgeon: Leslye Peer, MD;  Location: [facility] ENDOSCOPY;  Service: Pulmonary;;   BRONCHIAL NEEDLE ASPIRATION BIOPSY  04/18/2021   Procedure: BRONCHIAL NEEDLE ASPIRATION BIOPSIES;  Surgeon: Leslye Peer, MD;  Location: [facility];  Service: Pulmonary;;   CARDIOVASCULAR STRESS TEST  06/09/2008   normal nuclear study w/ no ischemia/  normal LV function and wall motion , ef 83%   CHOLECYSTECTOMY N/A 09/06/2017   Procedure: LAPAROSCOPIC CHOLECYSTECTOMY WITH INTRAOPERATIVE CHOLANGIOGRAM;  Surgeon: Darnell Level, MD;  Location: [facility];  Service: General;  Laterality: N/A;   COLONOSCOPY WITH PROPOFOL N/A 04/25/2022   Procedure: COLONOSCOPY WITH PROPOFOL;  Surgeon: Kerin Salen, MD;  Location: [facility];  Service: Gastroenterology;  Laterality: N/A;   CYSTOSCOPY/RETROGRADE/URETEROSCOPY/STONE EXTRACTION WITH BASKET Left 03/08/2016   Procedure: CYSTOSCOPY/RETROGRADE/URETEROSCOPY/STONE EXTRACTION WITH BASKET, STENT PLACEMENT;  Surgeon: Barron Alvine, MD;  Location: [facility];  Service: Urology;  Laterality: Left;   CYSTOSCOPY/URETEROSCOPY/HOLMIUM LASER/STENT PLACEMENT Left 07/27/2023   Procedure: CYSTOSCOPYLEFT/URETEROSCOPY/HOLMIUM LASER/STENT PLACEMENT;  Surgeon: Crista Elliot, MD;  Location: [facility];  Service: Urology;  Laterality: Left;   ENDOMETRIAL BIOPSY     ERCP N/A 09/07/2017   Procedure: ENDOSCOPIC RETROGRADE CHOLANGIOPANCREATOGRAPHY (ERCP);  Surgeon: Kerin Salen, MD;  Location: [facility]COPY;  Service: Gastroenterology;  Laterality: N/A;   ESOPHAGEAL BANDING  04/10/2023   Procedure: ESOPHAGEAL BANDING;  Surgeon: Kerin Salen, MD;  Location: [facility];  Service: Gastroenterology;;   ESOPHAGOGASTRODUODENOSCOPY N/A 04/25/2022   Procedure: ESOPHAGOGASTRODUODENOSCOPY (EGD);  Surgeon: Kerin Salen, MD;  Location: [facility];  Service: Gastroenterology;  Laterality: N/A;   ESOPHAGOGASTRODUODENOSCOPY N/A 04/10/2023   Procedure: ESOPHAGOGASTRODUODENOSCOPY (EGD);  Surgeon: Kerin Salen, MD;  Location: [facility];  Service: Gastroenterology;  Laterality: N/A;  with possible banding for esophageal varices   ESOPHAGOGASTRODUODENOSCOPY (EGD) WITH PROPOFOL N/A 03/16/2020   Procedure: ESOPHAGOGASTRODUODENOSCOPY (EGD) WITH PROPOFOL;  Surgeon: Kerin Salen, MD;  Location: [facility];  Service: Gastroenterology;  Laterality: N/A;  unable to locate ampulla, aborted ERCP, changed to EGD   HEMOSTASIS CLIP PLACEMENT  04/25/2022   Procedure: HEMOSTASIS CLIP PLACEMENT;  Surgeon: Kerin Salen, MD;  Location: [facility];  Service: Gastroenterology;;   HEMOSTASIS CONTROL  04/25/2022   Procedure: HEMOSTASIS CONTROL;  Surgeon: Kerin Salen, MD;  Location: [facility];  Service: Gastroenterology;;   HOLMIUM LASER APPLICATION Left 03/08/2016   Procedure: HOLMIUM LASER APPLICATION;  Surgeon: Barron Alvine, MD;  Location: [facility];  Service: Urology;  Laterality: Left;   MOUTH SURGERY     MYRINGECOTMY W/ REMOVAL MIDDLE EAR CHOLESTEATOMA TYPE 1 FASICA TYMPANOPLASTY  09/13/2000   POLYPECTOMY  04/25/2022   Procedure: POLYPECTOMY;  Surgeon: Kerin Salen, MD;  Location: [facility];  Service: Gastroenterology;;   ROBOTIC ASSISTED TOTAL HYSTERECTOMY WITH BILATERAL SALPINGO  OOPHERECTOMY  08-02-2009   at [facility]  dr Andrey Farmer   w/  Bilateral pelvic and para aortic lymph node dissection's   THYROIDECTOMY N/A 11/19/2015   Procedure: TOTAL THYROIDECTOMY;  Surgeon: Darnell Level, MD;  Location: [facility];  Service: General;  Laterality: N/A;   TONSILLECTOMY  age 47   TRANSTHORACIC ECHOCARDIOGRAM  07/19/2014   ef 55-60%/  trivial TR   TYMPANOPLASTY Right 1993   VIDEO BRONCHOSCOPY WITH ENDOBRONCHIAL NAVIGATION Left 04/18/2021   Procedure: ROBOTIC ASSISTED BRONCHOSCOPY WITH ENDOBRONCHIAL NAVIGATION;  Surgeon: Leslye Peer, MD;  Location: [facility];  Service: Pulmonary;  Laterality: Left;   VIDEO BRONCHOSCOPY WITH RADIAL ENDOBRONCHIAL ULTRASOUND  04/18/2021   Procedure: RADIAL ENDOBRONCHIAL ULTRASOUND;  Surgeon: Leslye Peer, MD;  Location: [facility] ENDOSCOPY;  Service: Pulmonary;;   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   Thrombocytopenia (HCC) 09/09/2021   Iron deficiency anemia due to chronic blood loss 06/24/2021   Status post bronchoscopy 04/20/2021   Pulmonary nodule 1 cm or greater in diameter 03/17/2021   Elevated levels of transaminase & lactic acid dehydrogenase 08/03/2020   Epigastric pain 08/03/2020  Liver nodule 08/03/2020   Obstruction of bile duct 08/03/2020   Cirrhosis of liver (HCC) 07/29/2020   Gastroparesis 07/29/2020   Irritable bowel syndrome 07/29/2020   Steatosis of liver 07/29/2020   MDD (major depressive disorder), recurrent episode, severe (HCC) 04/01/2020   Borderline personality disorder (HCC) 04/01/2020   Intermittent explosive disorder in adult 04/01/2020   Cholelithiasis with chronic cholecystitis 09/06/2017   Multinodular goiter (nontoxic) 11/17/2015   Diabetes mellitus without complication (HCC) 07/19/2014   GERD (gastroesophageal reflux disease) 07/19/2014   HLD  (hyperlipidemia) 02/09/2010   Obstructive sleep apnea 02/09/2010   Allergic rhinitis 02/09/2010   History of uterine cancer 02/09/2010    PCP: Knox Royalty, MD  REFERRING PROVIDER: London Sheer, MD  REFERRING DIAG: [phone] (ICD-10-CM) - Tear of left acetabular labrum, initial encounter   THERAPY DIAG:  Pain in left hip - Plan: PT plan of care cert/re-cert  Muscle weakness (generalized) - Plan: PT plan of care cert/re-cert  Other abnormalities of gait and mobility - Plan: PT plan of care cert/re-cert  Rationale for Evaluation and Treatment: Rehabilitation  ONSET DATE: Chronic  SUBJECTIVE:   SUBJECTIVE STATEMENT: Pt presents to PT with reports of chronic L hip pain. Denies overt trauma or MOI, pain has been gradually increasing. 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 Denies N/T down L LE, does describe some "electric shock" pain in anterior/lateral L hip.   PERTINENT HISTORY: DM II, Cervical Fusion, MDD  PAIN:  Are you having pain?  Yes: NPRS scale: 0/10 Worst: 10/10 Pain location: anterior thigh and posterolateral hip Pain description: sharp, "electric shock" Aggravating factors: walking, prolonged sitting, positioning Relieving factors: none  PRECAUTIONS: None  RED FLAGS: None   WEIGHT BEARING RESTRICTIONS: No  FALLS:  Has patient fallen in last 6 months? No  LIVING ENVIRONMENT: Lives with: lives with their spouse Lives in: House/apartment Stairs: Yes: External: 2 steps; can reach both Has following equipment at home: None  OCCUPATION: Not currently   PLOF: Independent  PATIENT GOALS: pt wants to decrease pain in L hip in order to improve comfort with home ADLs and caring for her spouse  NEXT MD VISIT: 10/01/2023  OBJECTIVE:  Note: Objective measures were completed at Evaluation unless otherwise noted.  DIAGNOSTIC FINDINGS:   N/A  PATIENT SURVEYS:   LEFS: 36/80  COGNITION: Overall cognitive status: Within functional limits for tasks assessed     SENSATION: WFL  MUSCLE LENGTH: Thomas test: Right (-); Left (+)  POSTURE: rounded shoulders, forward head, increased lumbar lordosis, and large body habitus  PALPATION: TTP to glute medius and proximal quad  LOWER EXTREMITY ROM:  Active ROM Right eval Left eval  Hip flexion    Hip extension    Hip abduction    Hip adduction    Hip internal rotation 20 9  Hip external rotation 35 30  Knee flexion    Knee extension    Ankle dorsiflexion    Ankle plantarflexion    Ankle inversion    Ankle eversion     (Blank rows = not tested)  LOWER EXTREMITY MMT:  MMT Right eval Left eval  Hip flexion 3+/5 3/5  Hip extension    Hip abduction 3+/5 3/5 p!  Hip adduction    Hip internal rotation    Hip external rotation    Knee flexion    Knee extension    Ankle dorsiflexion  Ankle plantarflexion    Ankle inversion    Ankle eversion     (Blank rows = not tested)  LOWER EXTREMITY SPECIAL TESTS:  Hip special tests: Luisa Hart (FABER) test: negative  FUNCTIONAL TESTS:  Five Time Sit to Stand: 20 seconds with UE  GAIT: Distance walked: 90ft Assistive device utilized: None Level of assistance: Complete Independence Comments: antalgic gait L   TREATMENT: [facility] Adult PT Treatment:                                                 Therapeutic Exercise: Modified thomas stretxh x 30" L S/L clam x 10 RTB L Bridge x 10 Seated fig 4 stretch x 30" L  PATIENT EDUCATION:  Education details: eval findings, LEFS, HEP, POC Person educated: Patient Education method: Explanation, Demonstration, and Handouts Education comprehension: verbalized understanding and returned demonstration  HOME EXERCISE PROGRAM: Access Code: [email or web address] URL: [email or web address] Date: 09/04/2023 Prepared by: Edwinna Areola  Exercises - Modified Maisie Fus Stretch  - 2 x daily - 7 x weekly - 2  reps - 30 sec hold - Clamshell with Resistance  - 1 x daily - 7 x weekly - 2 sets - 10 reps - red band hold - Supine Bridge  - 1 x daily - 7 x weekly - 2 sets - 10 reps - Seated Figure 4 Piriformis Stretch  - 1 x daily - 7 x weekly - 2 reps - 30 sec hold  ASSESSMENT:  CLINICAL IMPRESSION: Patient is a 53 y.o. F who was seen today for physical therapy evaluation and treatment for chronic L hip pain and discomfort. Physical findings are consistent with MD impression as pt has significant weakness and limited ROM of L hip. LEFS score indicates severe weakness in performance of home ADLs and desired recreational activities. Pt would benefit from skilled PT services working on improving L hip strength and mobility in order to decrease pain and improve functional activity tolerance.   OBJECTIVE IMPAIRMENTS: Abnormal gait, decreased activity tolerance, decreased balance, decreased mobility, difficulty walking, decreased ROM, decreased strength, and pain  ACTIVITY LIMITATIONS: lifting, sitting, standing, squatting, stairs, transfers, and locomotion level  PARTICIPATION LIMITATIONS: meal prep, cleaning, driving, shopping, community activity, occupation, yard work, and school  PERSONAL FACTORS: Time since onset of injury/illness/exacerbation and 3+ comorbidities: DM II, Cervical Fusion, MDD  are also affecting patient's functional outcome.   REHAB POTENTIAL: Good  CLINICAL DECISION MAKING: Evolving/moderate complexity  EVALUATION COMPLEXITY: Moderate   GOALS: Goals reviewed with patient? No  SHORT TERM GOALS: Target date: 09/25/2023   Pt will be compliant and knowledgeable with initial HEP for improved comfort and carryover Baseline: initial HEP given  Goal status: INITIAL  2.  Pt will self report left hip pain no greater than 7/10 for improved comfort and functional ability Baseline: 10/10 at worst Goal status: INITIAL   LONG TERM GOALS: Target date: 10/31/2023   Pt will improve LEFS to  no less than 45/80 as proxy for functional improvement with home ADLs and higher level community activity Baseline: 36/80 Goal status: INITIAL  2.  Pt will self report left hip pain no greater than 3/10 for improved comfort and functional ability Baseline: 10/10 at worst Goal status: INITIAL   3.  Pt will decrease Five Time Sit to Stand time to no less than 15 seconds for improved balance, strength, and functional mobility  Baseline: 20 seconds with UE Goal status: INITIAL    4.  Pt will improve L hip IR ROM to no less than 15 degrees for improved functoinal mobility and decrease in pain Baseline: 9 degrees Goal status: INITIAL  PLAN:  PT FREQUENCY: every other week  PT DURATION: 8 weeks  PLANNED INTERVENTIONS: 97164- PT Re-evaluation, 97110-Therapeutic exercises, 97530- Therapeutic activity, [id number]- Neuromuscular re-education, 97535- Self Care, 40981- Manual therapy, [id number]- Gait training, [id number]- Aquatic Therapy, [id number]- Electrical stimulation (unattended), [id number]- Electrical stimulation (manual), 97016- Vasopneumatic device, Dry Needling, Cryotherapy, and Moist heat  PLAN FOR NEXT SESSION: assess HEP response, hip strengthening, IR/ER stretching, gait    Eloy End, PT 09/05/2023, 9:15 AM

## 2023-09-04 ENCOUNTER — Ambulatory Visit: Attending: Orthopedic Surgery

## 2023-09-04 ENCOUNTER — Other Ambulatory Visit: Payer: Self-pay

## 2023-09-04 DIAGNOSIS — S73192A Other sprain of left hip, initial encounter: Secondary | ICD-10-CM | POA: Diagnosis not present

## 2023-09-04 DIAGNOSIS — R2689 Other abnormalities of gait and mobility: Secondary | ICD-10-CM

## 2023-09-04 DIAGNOSIS — M6281 Muscle weakness (generalized): Secondary | ICD-10-CM

## 2023-09-04 DIAGNOSIS — M25552 Pain in left hip: Secondary | ICD-10-CM

## 2023-09-10 ENCOUNTER — Other Ambulatory Visit: Payer: Self-pay | Admitting: Family Medicine

## 2023-09-10 DIAGNOSIS — Z1231 Encounter for screening mammogram for malignant neoplasm of breast: Secondary | ICD-10-CM

## 2023-09-17 ENCOUNTER — Encounter (HOSPITAL_BASED_OUTPATIENT_CLINIC_OR_DEPARTMENT_OTHER): Payer: Self-pay

## 2023-09-17 ENCOUNTER — Emergency Department (HOSPITAL_BASED_OUTPATIENT_CLINIC_OR_DEPARTMENT_OTHER)
Admission: EM | Admit: 2023-09-17 | Discharge: 2023-09-17 | Disposition: A | Discharge status: Home / Self Care | Attending: Emergency Medicine | Admitting: Emergency Medicine

## 2023-09-17 ENCOUNTER — Other Ambulatory Visit: Payer: Self-pay

## 2023-09-17 DIAGNOSIS — Z87442 Personal history of urinary calculi: Secondary | ICD-10-CM | POA: Insufficient documentation

## 2023-09-17 DIAGNOSIS — R1013 Epigastric pain: Secondary | ICD-10-CM | POA: Diagnosis present

## 2023-09-17 DIAGNOSIS — E109 Type 1 diabetes mellitus without complications: Secondary | ICD-10-CM | POA: Insufficient documentation

## 2023-09-17 DIAGNOSIS — E039 Hypothyroidism, unspecified: Secondary | ICD-10-CM | POA: Diagnosis not present

## 2023-09-17 LAB — COMPREHENSIVE METABOLIC PANEL WITH GFR
ALT: 23 U/L (ref 0–44)
AST: 31 U/L (ref 15–41)
Albumin: 4.2 g/dL (ref 3.5–5.0)
Alkaline Phosphatase: 71 U/L (ref 38–126)
Anion gap: 11 (ref 5–15)
BUN: 8 mg/dL (ref 6–20)
CO2: 26 mmol/L (ref 22–32)
Calcium: 8.7 mg/dL — ABNORMAL LOW (ref 8.9–10.3)
Chloride: 103 mmol/L (ref 98–111)
Creatinine, Ser: 0.68 mg/dL (ref 0.44–1.00)
GFR, Estimated: >60 mL/min (ref >60)
Glucose, Bld: 106 mg/dL — ABNORMAL HIGH (ref 70–99)
Potassium: 3.6 mmol/L (ref 3.5–5.1)
Sodium: 140 mmol/L (ref 135–145)
Total Bilirubin: 0.8 mg/dL (ref 0.0–1.2)
Total Protein: 7.1 g/dL (ref 6.5–8.1)

## 2023-09-17 LAB — CBC
HCT: 42.1 % (ref 36.0–46.0)
Hemoglobin: 13.7 g/dL (ref 12.0–15.0)
MCH: 27.2 pg (ref 26.0–34.0)
MCHC: 32.5 g/dL (ref 30.0–36.0)
MCV: 83.7 fL (ref 80.0–100.0)
Platelets: 82 10*3/uL — ABNORMAL LOW (ref 150–400)
RBC: 5.03 MIL/uL (ref 3.87–5.11)
RDW: 14.6 % (ref 11.5–15.5)
WBC: 3.1 10*3/uL — ABNORMAL LOW (ref 4.0–10.5)
nRBC: 0 % (ref 0.0–0.2)

## 2023-09-17 LAB — URINALYSIS, ROUTINE W REFLEX MICROSCOPIC
Bilirubin Urine: NEGATIVE
Glucose, UA: NEGATIVE mg/dL
Hgb urine dipstick: NEGATIVE
Nitrite: NEGATIVE
Specific Gravity, Urine: 1.022 (ref 1.005–1.030)
pH: 6.5 (ref 5.0–8.0)

## 2023-09-17 LAB — LIPASE, BLOOD: Lipase: 30 U/L (ref 11–51)

## 2023-09-17 MED ORDER — LIDOCAINE VISCOUS HCL 2 % MT SOLN
15.0000 mL | Freq: Once | OROMUCOSAL | Status: AC
  Administered 2023-09-17: 15 mL via ORAL
  Filled 2023-09-17: qty 15

## 2023-09-17 MED ORDER — METOCLOPRAMIDE HCL 10 MG PO TABS
10.0000 mg | ORAL_TABLET | Freq: Once | ORAL | Status: AC
  Administered 2023-09-17: 10 mg via ORAL
  Filled 2023-09-17: qty 1

## 2023-09-17 MED ORDER — ALUM & MAG HYDROXIDE-SIMETH 200-200-20 MG/5ML PO SUSP
30.0000 mL | Freq: Once | ORAL | Status: AC
  Administered 2023-09-17: 30 mL via ORAL
  Filled 2023-09-17: qty 30

## 2023-09-17 MED ORDER — SUCRALFATE 1 G PO TABS
1.0000 g | ORAL_TABLET | Freq: Three times a day (TID) | ORAL | 0 refills | Status: AC
Start: 2023-09-17 — End: ?

## 2023-09-17 MED ORDER — METOCLOPRAMIDE HCL 10 MG PO TABS: 10.0000 mg | ORAL_TABLET | Freq: Four times a day (QID) | ORAL | 0 refills | Status: DC

## 2023-09-17 MED ORDER — LIDOCAINE VISCOUS HCL 2 % MT SOLN: 10.0000 mL | OROMUCOSAL | 0 refills | Status: DC | PRN

## 2023-09-17 NOTE — ED Provider Notes (Signed)
  EMERGENCY DEPARTMENT AT [facility] Provider Note   CSN: [id number] Arrival date & time: 09/17/23  1317     History {Add pertinent medical, surgical, social history, OB history to HPI:1} No chief complaint on file.   Rhonda Higgins is a 53 y.o. female who has a pmh of cirrhosis, thormbocytopenia,DM, epigastric pain, IBD, gastroparesis. She has been having several days of epigastric postprandial abdominal pain and nausea without vomiting. She has tried Zofran without relief. She was told to take IBGuard  otc by her GI specialist, "but it has not helped." She is s/p cholecystectomy. She denies vomiting, diarrhea, constipation, fever, melena, hematochezia.  HPI     Home Medications Prior to Admission medications   Medication Sig Start Date End Date Taking? Authorizing Provider  ACCRUFER 30 MG CAPS Take 30 mg by mouth once a week. 09/14/22   [provider]  ACCU-CHEK GUIDE test strip  03/23/20   [provider]  Accu-Chek Softclix Lancets lancets daily. 07/21/20   [provider]  atorvastatin (LIPITOR) 40 MG tablet Take 40 mg by mouth daily. 08/22/20   [provider]  buPROPion (WELLBUTRIN XL) 300 MG 24 hr tablet Take 300 mg by mouth daily.    [provider]  cetirizine (ZYRTEC) 10 MG tablet Take 10 mg by mouth daily as needed for allergies.    [provider]  cyclobenzaprine (FLEXERIL) 10 MG tablet Take 1 tablet (10 mg total) by mouth 3 (three) times daily as needed (pain, muscle spasms). 08/22/23   London Sheer, MD  dicyclomine (BENTYL) 20 MG tablet Take 20 mg by mouth 4 (four) times daily as needed for spasms. 10/05/22   [provider]  gabapentin (NEURONTIN) 400 MG capsule Take 400-1,200 mg by mouth See admin instructions. Takes 400 mg in the morning 400 mg in the afternoon and 1200 mg at night    [provider]  hydrOXYzine (ATARAX) 25 MG tablet Take 25 mg by mouth 3 (three) times  daily as needed for anxiety. 07/02/23   [provider]  insulin glargine (LANTUS) 100 UNIT/ML injection Inject 18 Units into the skin at bedtime.    [provider]  lisinopril (PRINIVIL,ZESTRIL) 5 MG tablet Take 5 mg by mouth daily.    [provider]  metFORMIN (GLUCOPHAGE-XR) 500 MG 24 hr tablet Take 500 mg by mouth 2 (two) times daily. 10/13/21   [provider]  ondansetron (ZOFRAN-ODT) 4 MG disintegrating tablet Take 4 mg by mouth every 6 (six) hours as needed for vomiting or nausea.    [provider]  OZEMPIC, 1 MG/DOSE, 4 MG/3ML SOPN Inject 1 mg into the skin every Monday. 07/03/23   [provider]  pantoprazole (PROTONIX) 40 MG tablet Take 40 mg by mouth 2 (two) times daily.     [provider]  risperiDONE (RISPERDAL) 2 MG tablet Take 2 mg by mouth at bedtime.  08/22/16   [provider]  SYNTHROID 175 MCG tablet Take 175 mcg by mouth daily. 07/27/20   [provider]  tamsulosin (FLOMAX) 0.4 MG CAPS capsule Take 1 capsule (0.4 mg total) by mouth daily. 07/27/23   Crista Elliot, MD  traZODone (DESYREL) 50 MG tablet Take 1 tablet (50 mg total) by mouth at bedtime. Patient taking differently: Take 50 mg by mouth at bedtime as needed for sleep. 04/18/21   Leslye Peer, MD  Vitamin D, Ergocalciferol, (DRISDOL) 1.25 MG (50000 UNIT) CAPS capsule Take 50,000  Units by mouth once a week. 02/15/22   [provider]      Allergies    Hydrocodone, Tape, Wound dressing adhesive, Pholcodine, and Codeine    Review of Systems   Review of Systems  Physical Exam Updated Vital Signs BP 132/82   Pulse 69   Temp 97.8 F (36.6 C) (Temporal)   Resp 16   SpO2 100%  Physical Exam Vitals and nursing note reviewed.  Constitutional:      General: She is not in acute distress.    Appearance: She is well-developed. She is not diaphoretic.  HENT:     Head: Normocephalic and atraumatic.     Right Ear: External  ear normal.     Left Ear: External ear normal.     Nose: Nose normal.     Mouth/Throat:     Mouth: Mucous membranes are moist.  Eyes:     General: No scleral icterus.    Conjunctiva/sclera: Conjunctivae normal.  Cardiovascular:     Rate and Rhythm: Normal rate and regular rhythm.     Heart sounds: Normal heart sounds. No murmur heard.    No friction rub. No gallop.  Pulmonary:     Effort: Pulmonary effort is normal. No respiratory distress.     Breath sounds: Normal breath sounds.  Abdominal:     General: Bowel sounds are normal. There is no distension.     Palpations: Abdomen is soft. There is no mass.     Tenderness: There is no abdominal tenderness. There is no guarding.  Musculoskeletal:     Cervical back: Normal range of motion.  Skin:    General: Skin is warm and dry.  Neurological:     Mental Status: She is alert and oriented to person, place, and time.  Psychiatric:        Behavior: Behavior normal.    ED Results / Procedures / Treatments   Labs (all labs ordered are listed, but only abnormal results are displayed) Labs Reviewed  LIPASE, BLOOD  COMPREHENSIVE METABOLIC PANEL WITH GFR  CBC  URINALYSIS, ROUTINE W REFLEX MICROSCOPIC    EKG None  Radiology No results found.  Procedures Procedures  {Document cardiac monitor, telemetry assessment procedure when appropriate:1}  Medications Ordered in ED Medications - No data to display  ED Course/ Medical Decision Making/ A&P   {   Click here for ABCD2, HEART and other calculatorsREFRESH Note before signing :1}                              Medical Decision Making Amount and/or Complexity of Data Reviewed Labs: ordered.  Risk OTC drugs. Prescription drug management.   This patient presents to the ED for concern of epigastric abd pain, this involves an extensive number of treatment options, and is a complaint that carries with it a high risk of complications and morbidity.  The differential diagnosis  includes Differential diagnosis of epigastric pain includes: Functional or nonulcer dyspepsia  PUD, GERD, Gastritis, (NSAIDs, alcohol, stress, H. pylori, pernicious anemia), pancreatitis or pancreatic cancer, overeating indigestion (high-fat foods, coffee), drugs (aspirin, antibiotics (eg, macrolides, metronidazole), corticosteroids, digoxin, narcotics, theophylline), gastroparesis, lactose intolerance, malabsorption gastric cancer, parasitic infection, (Giardia, Strongyloides, Ascaris) cholelithiasis, choledocholithiasis, or cholangitis, ACS, pericarditis, pneumonia, abdominal hernia, pregnancy, intestinal ischemia, esophageal rupture, gastric volvulus, hepatitis.    Co morbidities that complicate the patient evaluation  ***   Additional history obtained:  Additional history obtained from *** External records  from outside source obtained and reviewed including ***   Lab Tests:  I Ordered, and personally interpreted labs.  The pertinent results include:  ***   Imaging Studies ordered:  I ordered imaging studies including ***  I independently visualized and interpreted imaging which showed *** I agree with the radiologist interpretation   Cardiac Monitoring: / EKG:  The patient was maintained on a cardiac monitor.  I personally viewed and interpreted the cardiac monitored which showed an underlying rhythm of: ***   Consultations Obtained:  I requested consultation with the ***,  and discussed lab and imaging findings as well as pertinent plan - they recommend: ***   Problem List / ED Course / Critical interventions / Medication management  *** I ordered medication including ***  for ***  Reevaluation of the patient after these medicines showed that the patient {resolved/improved/worsened:23923::"improved"} I have reviewed the patients home medicines and have made adjustments as needed   Social Determinants of Health:  ***   Test / Admission -  Considered:  ***   {Document critical care time when appropriate:1} {Document review of labs and clinical decision tools ie heart score, Chads2Vasc2 etc:1}  {Document your independent review of radiology images, and any outside records:1} {Document your discussion with family members, caretakers, and with consultants:1} {Document social determinants of health affecting pt's care:1} {Document your decision making why or why not admission, treatments were needed:1} Final Clinical Impression(s) / ED Diagnoses Final diagnoses:  None    Rx / DC Orders ED Discharge Orders     None

## 2023-09-17 NOTE — ED Triage Notes (Signed)
 Pt c/o abd pain onset 1+wk ago, was advised by GI to try "IB guard, but that's not been helping." Associated nausea, intermittent pain 9/10 States she was "really sick Saturday & Sunday," advised by GI to come for further eval

## 2023-09-17 NOTE — Discharge Instructions (Signed)
Contact a health care provider if: Your pain changes, gets worse, or lasts longer than expected. You have severe cramping or bloating in your abdomen, or you vomit. Your pain gets worse with meals, after eating, or with certain foods. You are constipated or have diarrhea for more than 2-3 days. You are not hungry, or you lose weight without trying. You have signs of dehydration. These may include: Dark pee, very little pee, or no pee. Cracked lips or dry mouth. Sleepiness or weakness. You have pain when you pee (urinate) or poop. Your abdominal pain wakes you up at night. You have blood in your pee. You have a fever. Get help right away if: You cannot stop vomiting. Your pain is only in one part of the abdomen. Pain on the right side could be caused by appendicitis. You have bloody or black poop (stool), or poop that looks like tar. You have trouble breathing. You have chest pain. These symptoms may be an emergency. Get help right away. Call 911. Do not wait to see if the symptoms will go away. Do not drive yourself to the hospital.

## 2023-09-18 ENCOUNTER — Ambulatory Visit: Admitting: Physical Therapy

## 2023-09-24 ENCOUNTER — Other Ambulatory Visit: Payer: Self-pay | Admitting: Student

## 2023-09-24 DIAGNOSIS — K7460 Unspecified cirrhosis of liver: Secondary | ICD-10-CM

## 2023-10-01 ENCOUNTER — Ambulatory Visit (INDEPENDENT_AMBULATORY_CARE_PROVIDER_SITE_OTHER): Admitting: Orthopedic Surgery

## 2023-10-01 ENCOUNTER — Emergency Department (HOSPITAL_COMMUNITY)
Admission: EM | Admit: 2023-10-01 | Discharge: 2023-10-02 | Discharge status: Against Medical Advice | Attending: Emergency Medicine | Admitting: Emergency Medicine

## 2023-10-01 ENCOUNTER — Other Ambulatory Visit: Payer: Self-pay

## 2023-10-01 ENCOUNTER — Other Ambulatory Visit (INDEPENDENT_AMBULATORY_CARE_PROVIDER_SITE_OTHER)

## 2023-10-01 ENCOUNTER — Emergency Department (HOSPITAL_COMMUNITY)

## 2023-10-01 ENCOUNTER — Encounter (HOSPITAL_COMMUNITY): Payer: Self-pay

## 2023-10-01 ENCOUNTER — Other Ambulatory Visit

## 2023-10-01 ENCOUNTER — Ambulatory Visit: Payer: Medicaid Other | Admitting: Dermatology

## 2023-10-01 DIAGNOSIS — Z5321 Procedure and treatment not carried out due to patient leaving prior to being seen by health care provider: Secondary | ICD-10-CM | POA: Insufficient documentation

## 2023-10-01 DIAGNOSIS — M25521 Pain in right elbow: Secondary | ICD-10-CM | POA: Insufficient documentation

## 2023-10-01 DIAGNOSIS — M25561 Pain in right knee: Secondary | ICD-10-CM | POA: Insufficient documentation

## 2023-10-01 DIAGNOSIS — M25551 Pain in right hip: Secondary | ICD-10-CM

## 2023-10-01 DIAGNOSIS — R0781 Pleurodynia: Secondary | ICD-10-CM | POA: Insufficient documentation

## 2023-10-01 DIAGNOSIS — W19XXXA Unspecified fall, initial encounter: Secondary | ICD-10-CM | POA: Insufficient documentation

## 2023-10-01 DIAGNOSIS — Y92009 Unspecified place in unspecified non-institutional (private) residence as the place of occurrence of the external cause: Secondary | ICD-10-CM | POA: Insufficient documentation

## 2023-10-01 DIAGNOSIS — M25552 Pain in left hip: Secondary | ICD-10-CM

## 2023-10-01 MED ORDER — HYDROCODONE-ACETAMINOPHEN 5-325 MG PO TABS
1.0000 | ORAL_TABLET | Freq: Four times a day (QID) | ORAL | 0 refills | Status: AC | PRN
Start: 2023-10-01 — End: 2023-10-06

## 2023-10-01 NOTE — ED Provider Triage Note (Cosign Needed)
 Emergency Medicine Provider Triage Evaluation Note  Rhonda Higgins , a 53 y.o. female  was evaluated in triage.  Pt complains of fall. Did not hit head. No HA. No BT. No LOC or AMS  Review of Systems  Positive: Right ribs, right elbow, right knee Negative: HA, neck pain  Physical Exam  BP (!) 161/79 (BP Location: Left Arm)   Pulse 81   Temp 98.3 F (36.8 C) (Oral)   Resp 16   Ht 5\' 3"  (1.6 m)   Wt 94.3 kg   SpO2 97%   BMI 36.83 kg/m  Gen:   Awake, no distress   Resp:  Normal effort  MSK:   Moves extremities without difficulty  Other:    Medical Decision Making  Medically screening exam initiated at 1:17 PM.  Appropriate orders placed.  Rhonda Higgins was informed that the remainder of the evaluation will be completed by another provider, this initial triage assessment does not replace that evaluation, and the importance of remaining in the ED until their evaluation is complete.     Eudora Heron, PA-C 10/01/23 1320

## 2023-10-01 NOTE — Progress Notes (Signed)
 Orthopedic Surgery Progress Note   Assessment: Patient is a 53 y.o. female with bilateral hip pain.  Her right hip is not as bad and seems compensatory in nature.  Her left hip has exam findings consistent with intra-articular origin   Plan: - No operative plans at this time -Weightbearing as tolerated bilateral lower extremities -Continue home PT exercises -Prescribed Norco for pain relief to be used sparingly -Recommended a diagnostic/therapeutic left hip injection with Dr. Vaughn Georges - Follow-up on an as-needed basis  ___________________________________________________________________________  Subjective: Patient has had over a year history of left hip pain.  She said it has gotten progressively worse and has been more notable with the last couple weeks.  She has also noticed more recent development of right hip pain.  She says she has the pain mostly when weightbearing but she does feel some when resting as well.  Her left hip pain is much more significant than her right.  There is no trauma or injury that preceded the onset of pain.  Denies paresthesias and numbness.  No pain radiating past the hip.   Physical Exam:  General: no acute distress, appears stated age Neurologic: alert, answering questions appropriately, following commands Respiratory: unlabored breathing on room air, symmetric chest rise Psychiatric: appropriate affect, normal cadence to speech  MSK:   -Right hip exam  Negative Stinchfield, negative FADIR, negative FABER, negative SI joint compression test  EHL/TA/GSC intact  Dorsiflexes and plantar flexes toes Sensation intact to light touch in sural, saphenous, tibial, deep peroneal, and superficial peroneal nerve distributions Foot warm and well perfused  - Left hip exam  Positive Stinchfield, positive FADIR, negative FABER, negative SI joint compression test, pain with flexion past 90 degrees at the hip Plantarflexes and dorsiflexes toes Sensation intact to  light touch in sural, saphenous, tibial, deep peroneal, and superficial peroneal nerve distributions Foot warm and well perfused  Imaging: XRs of the left hip from 10/01/2023 were independently reviewed and interpreted, showing mild disc space narrowing but no other significant degenerative changes.  No fracture or dislocation seen.  XRs of the right hip from 10/01/2023 were independent reviewed interpreted, showing cam deformity of the hip.  Small osteophyte off the superior acetabulum.  No other significant degenerative changes seen.  No fracture or dislocation seen.   Patient name: Rhonda Higgins [place]arth Patient MRN: [id number] Date: 10/01/23

## 2023-10-01 NOTE — ED Triage Notes (Signed)
 Pt arrived reporting R rib pain, R elbow pain and R knee pain after fall earlier today at home. Denies LOC or head injury. States denies any dizziness prior to and after the fall. No blood thinners.

## 2023-10-02 ENCOUNTER — Ambulatory Visit

## 2023-10-02 NOTE — ED Notes (Signed)
 Pt called x 3, no answer inside or from sidewalk

## 2023-10-08 ENCOUNTER — Telehealth: Payer: Self-pay | Admitting: Orthopedic Surgery

## 2023-10-08 NOTE — Telephone Encounter (Signed)
 Scheduled for 10/11/23 @ 10

## 2023-10-08 NOTE — Telephone Encounter (Signed)
 Pt called requesting a call back. Pt states she fell a wk ago and having neck pains. Please call pt at [phone].

## 2023-10-11 ENCOUNTER — Other Ambulatory Visit: Payer: Self-pay | Admitting: Gastroenterology

## 2023-10-11 ENCOUNTER — Other Ambulatory Visit (INDEPENDENT_AMBULATORY_CARE_PROVIDER_SITE_OTHER): Payer: Self-pay

## 2023-10-11 ENCOUNTER — Ambulatory Visit: Admitting: Orthopedic Surgery

## 2023-10-11 DIAGNOSIS — M25522 Pain in left elbow: Secondary | ICD-10-CM | POA: Diagnosis not present

## 2023-10-11 DIAGNOSIS — M25521 Pain in right elbow: Secondary | ICD-10-CM

## 2023-10-11 MED ORDER — OXYCODONE HCL 5 MG PO TABS: 5.0000 mg | ORAL_TABLET | ORAL | 0 refills | Status: AC | PRN

## 2023-10-11 NOTE — Progress Notes (Signed)
 Orthopedic Surgery Progress Note   Assessment: Patient is a 53 y.o. female with right elbow and right knee pain after a fall.  Exam not suggestive of any tendon injury.  X-rays of the elbow did not show any fracture  Plan: - Did not recommend any operative management at this time -Would proceed with symptomatic treatment -Prescribed oxycodone  for pain relief-told her that she should use this sparingly.  She continues Tylenol .  She should ice the affected areas.  She has used lidocaine  over the chest wall where that hurts.  Will bring her to hydrocodone  next - Weight bearing status: As tolerated - Follow-up in 3 weeks, x-rays at next visit: None  ___________________________________________________________________________  Subjective: Patient states that she had a fall in her house about a week ago.  She fell to the ground.  She noted acute onset of right chest wall, right elbow, and right knee pain.  She noticed bruising over those areas.  Pain was severe at first.  It has gotten a little bit better since onset.  She has not tried any specific treatment so far.  She has been ambulating without assistive devices.   Physical Exam:  General: no acute distress, appears stated age Neurologic: alert, answering questions appropriately, following commands Respiratory: unlabored breathing on room air, symmetric chest rise Psychiatric: appropriate affect, normal cadence to speech  MSK:   -Right upper extremity  Ecchymosis seen over the olecranon, no breaks in the skin, no gross deformity, TTP over the ecchymotic area but no other tenderness palpation, able to flex and extend the elbow actively, negative bicep hook test Fires deltoid, biceps, triceps, wrist extensors, wrist flexors, finger extensors, finger flexors  AIN/PIN/IO intact  Palpable radial pulse  Sensation intact to light touch in median/ulnar/radial/axillary nerve distributions  Hand warm and well perfused  -Right lower  extremity  Ecchymosis seen over the patellar tendon and the tibial tubercle, swelling in that area as well, no breaks in the skin seen, no gross deformity, able to extend the knee against gravity, knee stable to varus and valgus stress, negative Lachman, negative posterior drawer, no palpable effusion, TTP over the ecchymotic area otherwise nontender to palpation over the remainder of the leg Fires hip flexors, quadriceps, hamstrings, tibialis anterior, gastrocnemius and soleus, extensor hallucis longus Plantarflexes and dorsiflexes toes Sensation intact to light touch in sural, saphenous, tibial, deep peroneal, and superficial peroneal nerve distributions Foot warm and well perfused  Imaging: XRs of the right elbow from 10/07/2023 were independently reviewed and interpreted, showing no fracture or dislocation.  No effusion seen within the elbow joint.   Patient name: Rhonda Higgins Patient MRN: [id number] Date: 10/11/23

## 2023-10-16 ENCOUNTER — Encounter: Payer: Self-pay | Admitting: Podiatry

## 2023-10-16 ENCOUNTER — Ambulatory Visit: Admitting: Sports Medicine

## 2023-10-16 ENCOUNTER — Encounter: Payer: Self-pay | Admitting: Sports Medicine

## 2023-10-16 ENCOUNTER — Other Ambulatory Visit: Payer: Self-pay

## 2023-10-16 ENCOUNTER — Encounter: Payer: Self-pay | Admitting: Hematology and Oncology

## 2023-10-16 ENCOUNTER — Ambulatory Visit (INDEPENDENT_AMBULATORY_CARE_PROVIDER_SITE_OTHER): Payer: Medicaid Other | Admitting: Podiatry

## 2023-10-16 ENCOUNTER — Encounter

## 2023-10-16 VITALS — Ht 63.0 in | Wt 207.0 lb

## 2023-10-16 DIAGNOSIS — B351 Tinea unguium: Secondary | ICD-10-CM

## 2023-10-16 DIAGNOSIS — M25552 Pain in left hip: Secondary | ICD-10-CM

## 2023-10-16 DIAGNOSIS — E1142 Type 2 diabetes mellitus with diabetic polyneuropathy: Secondary | ICD-10-CM

## 2023-10-16 DIAGNOSIS — M79675 Pain in left toe(s): Secondary | ICD-10-CM

## 2023-10-16 DIAGNOSIS — E119 Type 2 diabetes mellitus without complications: Secondary | ICD-10-CM | POA: Diagnosis not present

## 2023-10-16 DIAGNOSIS — M79674 Pain in right toe(s): Secondary | ICD-10-CM | POA: Diagnosis not present

## 2023-10-16 DIAGNOSIS — M25551 Pain in right hip: Secondary | ICD-10-CM | POA: Diagnosis not present

## 2023-10-16 MED ORDER — METHYLPREDNISOLONE ACETATE 40 MG/ML IJ SUSP
40.0000 mg | INTRAMUSCULAR | Status: AC | PRN
  Administered 2023-10-16: 40 mg via INTRA_ARTICULAR

## 2023-10-16 MED ORDER — LIDOCAINE HCL 1 % IJ SOLN
4.0000 mL | INTRAMUSCULAR | Status: AC | PRN
  Administered 2023-10-16: 4 mL

## 2023-10-16 NOTE — Progress Notes (Signed)
 Rhonda TAMES Higgins - 53 y.o. female MRN [id number]  Date of birth: [birth year]  Office Visit Note: Visit Date: 10/16/2023 PCP: Trellis Fries, MD Referred by: Trellis Fries, MD  Subjective: Chief Complaint  Patient presents with   Left Hip - Pain   HPI: Rhonda Higgins is a pleasant 53 y.o. female who presents today for evaluation of L > R hip pain.  She has had bilateral hip pain, but the left hip being most symptomatic for over 1 year.  Did review previous x-rays which show small cam deformity but no significant advanced osteoarthritic change.  She is doing physical therapy with a home exercise regimen for both hips, we did review this today.  She is a type-II diabetic, but has been relatively well-controlled as of late.  Lab Results  Component Value Date   HGBA1C 7.4 (H) 04/03/2020   Pertinent ROS were reviewed with the patient and found to be negative unless otherwise specified above in HPI.   Assessment & Plan: Visit Diagnoses:  1. Pain of left hip   2. Pain of right hip   3. Diabetes mellitus without complication (HCC)    Plan: Impression is chronic bilateral hip pain with the left hip being most symptomatic, she has physical exam findings of irritation from the intra-articular hip.  Possible labral abnormality versus hip impingement.  For both diagnostic and hopefully therapeutic purposes, we did proceed with ultrasound-guided left intra-articular hip injection.  Patient tolerated well.  Advised on postinjection protocol.  She is a diabetic but much better controlled as of late, will monitor for temporary glucose rise and adjust diabetic medications as needed.  We did make some modifications to her home exercise regimen, she will add this and continue her old home rehab starting this upcoming Friday, will hold on these prior to that given injection.  She does have hydrocodone  to take for breakthrough pain only from another provider.  She will follow-up with Dr.  Sulema Endo over the next 3-4 weeks for reevaluation.  I am happy to see her back as needed.  Follow-up: Return for f/u with Dr. Sulema Endo as indicated.   Meds & Orders: No orders of the defined types were placed in this encounter.   Orders Placed This Encounter  Procedures   Large Joint Inj   US  Guided Needle Placement - No Linked Charges    Procedures: Large Joint Inj: L hip joint on 10/16/2023 1:27 PM Indications: pain Details: 22 G 3.5 in needle, ultrasound-guided anterior approach Medications: 4 mL lidocaine  1 %; 40 mg methylPREDNISolone  acetate 40 MG/ML Outcome: tolerated well, no immediate complications  Procedure: US -guided intra-articular hip injection, Left After discussion on risks/benefits/indications and informed verbal consent was obtained, a timeout was performed. Patient was lying supine on exam table. The hip was cleaned with betadine  and alcohol swabs. Then utilizing ultrasound guidance, the patient's femoral head and neck junction was identified and subsequently injected with 4:1 lidocaine :depomedrol via an in-plane approach with ultrasound visualization of the injectate administered into the hip joint. Patient tolerated procedure well without immediate complications.  Procedure, treatment alternatives, risks and benefits explained, specific risks discussed. Consent was given by the patient. Immediately prior to procedure a time out was called to verify the correct patient, procedure, equipment, support staff and site/side marked as required. Patient was prepped and draped in the usual sterile fashion.          Clinical History:  Objective:    Physical Exam  Gen: Well-appearing, in no acute  distress; non-toxic CV: Well-perfused. Warm.  Resp: Breathing unlabored on room air; no wheezing. Psych: Fluid speech in conversation; appropriate affect; normal thought process  Ortho Exam - Bilateral hips: No redness swelling or effusion of the hip.  Positive FADIR test of the left  hip.  Remainder of provocative examinations deferred today secondary to previous examination.  Imaging:  *I did review X-ray imaging of both left and right hip during today's visit:  - XRs of the left hip from 10/01/2023 were independently reviewed and  interpreted, showing mild disc space narrowing but no other significant  degenerative changes.  No fracture or dislocation seen.   - XRs of the right hip from 10/01/2023 were independent reviewed interpreted,  showing cam deformity of the hip.  Small osteophyte off the superior  acetabulum.  No other significant degenerative changes seen.  No fracture  or dislocation seen.   Past Medical/Family/Surgical/Social History: Medications & Allergies reviewed per EMR, new medications updated. 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   Thrombocytopenia (HCC) 09/09/2021   Iron  deficiency anemia due to chronic blood loss 06/24/2021   Status post bronchoscopy 04/20/2021   Pulmonary nodule 1 cm or greater in diameter 03/17/2021   Elevated levels of transaminase & lactic acid dehydrogenase 08/03/2020   Epigastric pain 08/03/2020   Liver nodule 08/03/2020   Obstruction of bile duct 08/03/2020   Cirrhosis of liver (HCC) 07/29/2020   Gastroparesis 07/29/2020   Irritable bowel syndrome 07/29/2020   Steatosis of liver 07/29/2020   MDD (major depressive disorder), recurrent episode, severe (HCC) 04/01/2020   Borderline personality disorder (HCC) 04/01/2020   Intermittent explosive disorder in adult 04/01/2020   Cholelithiasis with chronic cholecystitis 09/06/2017   Multinodular goiter (nontoxic) 11/17/2015   Diabetes mellitus without complication (HCC) 07/19/2014   GERD (gastroesophageal reflux disease) 07/19/2014   HLD (hyperlipidemia) 02/09/2010   Obstructive sleep apnea  02/09/2010   Allergic rhinitis 02/09/2010   History of uterine cancer 02/09/2010   Past Medical History:  Diagnosis Date   Adenocarcinoma (HCC)    endometrial, FIGO GRADE 1   Allergic rhinitis    Arthritis    Atypical chest pain    History of   Borderline personality disorder (HCC)    Cirrhosis (HCC)    Depression    Elevated liver enzymes    GERD (gastroesophageal reflux disease)    Hematuria    History of endometrial cancer 08-02-2009   oncologist-  dr brewster/ [facility] and dr kinard/  no recurrence   endometrial adenocarinoma Stage 1B, Grade 1, FIGO--  s/p  TAH w/ BSO and pelvic lymph node dissection's and radiation therapy   History of kidney stones    History of radiation therapy    2011  pelvic intracavity brachytherapy treatment's for endometrial carcinoma   History of thyroid  nodule    multinodular goiter s/p  total thyroidectomy 11-19-2015  per pathology -  adenomatoid nodules   Hyperlipidemia    Hypothyroidism, postsurgical    Insulin  dependent diabetes mellitus    Type 2   Left ureteral stone    Obesity    OSA (obstructive sleep apnea)    sleep study 2017 showed OSA negative (pt had OSA in 2011, and lost weight, no issues since)   Overactive bladder    Personality disorder (HCC)    Polyphagia(783.6)    PONV (postoperative nausea and vomiting)  after ear surgery only one time   Right lower quadrant pain    Umbilical hernia    Urgency of urination    UTI (urinary tract infection)    Family History  Problem Relation Age of Onset   Heart disease Sister    Heart attack Brother    Heart disease Brother    Heart disease Sister    Diabetes Sister    Breast cancer Sister    Asthma Mother    Heart disease Mother    Diabetes Mother    Emphysema Mother    Hypertension Mother    Stroke Mother    Prostate cancer Father    Past Surgical History:  Procedure Laterality Date   ANTERIOR CERVICAL DECOMP/DISCECTOMY FUSION N/A 01/26/2023   Procedure: C5-6, C6-7  ANTERIOR CERVICAL DECOMPRESSION/DISCECTOMY FUSION 2 LEVELS;  Surgeon: Diedra Fowler, MD;  Location: [facility];  Service: Orthopedics;  Laterality: N/A;   BIOPSY  04/25/2022   Procedure: BIOPSY;  Surgeon: Genell Ken, MD;  Location: [facility];  Service: Gastroenterology;;   BIOPSY  04/10/2023   Procedure: BIOPSY;  Surgeon: Genell Ken, MD;  Location: [facility];  Service: Gastroenterology;;   BRONCHIAL BIOPSY  04/18/2021   Procedure: BRONCHIAL BIOPSIES;  Surgeon: Denson Flake, MD;  Location[facility] ENDOSCOPY;  Service: Pulmonary;;   BRONCHIAL BRUSHINGS  04/18/2021   Procedure: BRONCHIAL BRUSHINGS;  Surgeon: Denson Flake, MD;  Location: [facility] ENDOSCOPY;  Service: Pulmonary;;   BRONCHIAL NEEDLE ASPIRATION BIOPSY  04/18/2021   Procedure: BRONCHIAL NEEDLE ASPIRATION BIOPSIES;  Surgeon: Denson Flake, MD;  Location: [facility];  Service: Pulmonary;;   CARDIOVASCULAR STRESS TEST  06/09/2008   normal nuclear study w/ no ischemia/  normal LV function and wall motion , ef 83%   CHOLECYSTECTOMY N/A 09/06/2017   Procedure: LAPAROSCOPIC CHOLECYSTECTOMY WITH INTRAOPERATIVE CHOLANGIOGRAM;  Surgeon: Oralee Billow, MD;  Location: [facility];  Service: General;  Laterality: N/A;   COLONOSCOPY WITH PROPOFOL  N/A 04/25/2022   Procedure: COLONOSCOPY WITH PROPOFOL ;  Surgeon: Genell Ken, MD;  Location: [facility];  Service: Gastroenterology;  Laterality: N/A;   CYSTOSCOPY/RETROGRADE/URETEROSCOPY/STONE EXTRACTION WITH BASKET Left 03/08/2016   Procedure: CYSTOSCOPY/RETROGRADE/URETEROSCOPY/STONE EXTRACTION WITH BASKET, STENT PLACEMENT;  Surgeon: Ann Barnacle, MD;  Location: [facility];  Service: Urology;  Laterality: Left;   CYSTOSCOPY/URETEROSCOPY/HOLMIUM LASER/STENT PLACEMENT Left 07/27/2023   Procedure: CYSTOSCOPYLEFT/URETEROSCOPY/HOLMIUM LASER/STENT PLACEMENT;  Surgeon: Samson Croak, MD;  Location: [facility];  Service: Urology;  Laterality: Left;   ENDOMETRIAL BIOPSY     ERCP N/A 09/07/2017    Procedure: ENDOSCOPIC RETROGRADE CHOLANGIOPANCREATOGRAPHY (ERCP);  Surgeon: Genell Ken, MD;  Location: [facility];  Service: Gastroenterology;  Laterality: N/A;   ESOPHAGEAL BANDING  04/10/2023   Procedure: ESOPHAGEAL BANDING;  Surgeon: Genell Ken, MD;  Location: [facility];  Service: Gastroenterology;;   ESOPHAGOGASTRODUODENOSCOPY N/A 04/25/2022   Procedure: ESOPHAGOGASTRODUODENOSCOPY (EGD);  Surgeon: Genell Ken, MD;  Location: [facility];  Service: Gastroenterology;  Laterality: N/A;   ESOPHAGOGASTRODUODENOSCOPY N/A 04/10/2023   Procedure: ESOPHAGOGASTRODUODENOSCOPY (EGD);  Surgeon: Genell Ken, MD;  Location: [facility];  Service: Gastroenterology;  Laterality: N/A;  with possible banding for esophageal varices   ESOPHAGOGASTRODUODENOSCOPY (EGD) WITH PROPOFOL  N/A 03/16/2020   Procedure: ESOPHAGOGASTRODUODENOSCOPY (EGD) WITH PROPOFOL ;  Surgeon: Genell Ken, MD;  Location: [facility];  Service: Gastroenterology;  Laterality: N/A;  unable to locate ampulla, aborted ERCP, changed to EGD   HEMOSTASIS CLIP PLACEMENT  04/25/2022   Procedure: HEMOSTASIS CLIP PLACEMENT;  Surgeon: Genell Ken, MD;  Location: [facility];  Service: Gastroenterology;;  HEMOSTASIS CONTROL  04/25/2022   Procedure: HEMOSTASIS CONTROL;  Surgeon: Genell Ken, MD;  Location: [facility];  Service: Gastroenterology;;   HOLMIUM LASER APPLICATION Left 03/08/2016   Procedure: HOLMIUM LASER APPLICATION;  Surgeon: Ann Barnacle, MD;  Location: [facility];  Service: Urology;  Laterality: Left;   MOUTH SURGERY     MYRINGECOTMY W/ REMOVAL MIDDLE EAR CHOLESTEATOMA TYPE 1 FASICA TYMPANOPLASTY  09/13/2000   POLYPECTOMY  04/25/2022   Procedure: POLYPECTOMY;  Surgeon: Genell Ken, MD;  Location: [facility];  Service: Gastroenterology;;   ROBOTIC ASSISTED TOTAL HYSTERECTOMY WITH BILATERAL SALPINGO OOPHERECTOMY  08-02-2009   at [facility]  dr [facility]   w/  Bilateral pelvic and para aortic lymph node dissection's    THYROIDECTOMY N/A 11/19/2015   Procedure: TOTAL THYROIDECTOMY;  Surgeon: Oralee Billow, MD;  Location: [facility];  Service: General;  Laterality: N/A;   TONSILLECTOMY  age 50   TRANSTHORACIC ECHOCARDIOGRAM  07/19/2014   ef 55-60%/  trivial TR   TYMPANOPLASTY Right 1993   VIDEO BRONCHOSCOPY WITH ENDOBRONCHIAL NAVIGATION Left 04/18/2021   Procedure: ROBOTIC ASSISTED BRONCHOSCOPY WITH ENDOBRONCHIAL NAVIGATION;  Surgeon: Denson Flake, MD;  Location: [facility] ENDOSCOPY;  Service: Pulmonary;  Laterality: Left;   VIDEO BRONCHOSCOPY WITH RADIAL ENDOBRONCHIAL ULTRASOUND  04/18/2021   Procedure: RADIAL ENDOBRONCHIAL ULTRASOUND;  Surgeon: Denson Flake, MD;  Location: [facility] ENDOS[facility];  Service: Pulmonary;;   Social History   Occupational History   Occupation: n/a  Tobacco Use   Smoking status: Never   Smokeless tobacco: Never  Vaping Use   Vaping status: Never Used  Substance and Sexual Activity   Alcohol use: No   Drug use: No   Sexual activity: Yes    Birth control/protection: None

## 2023-10-21 NOTE — Progress Notes (Signed)
 ANNUAL DIABETIC FOOT EXAM  Subjective: Rhonda Higgins presents today for annual diabetic foot exam. Chief Complaint  Patient presents with   Nail Problem    Patient is here for RFC, no changes   Patient confirms h/o diabetes.  Patient denies any h/o foot wounds.  Patient has been diagnosed with neuropathy.  Trellis Fries, MD is patient's PCP.  Past Medical History:  Diagnosis Date   Adenocarcinoma (HCC)    endometrial, FIGO GRADE 1   Allergic rhinitis    Arthritis    Atypical chest pain    History of   Borderline personality disorder (HCC)    Cirrhosis (HCC)    Depression    Elevated liver enzymes    GERD (gastroesophageal reflux disease)    Hematuria    History of endometrial cancer 08-02-2009   oncologist-  dr brewster/ [facility] [facility] and dr kinard/  no recurrence   endometrial adenocarinoma Stage 1B, Grade 1, FIGO--  s/p  TAH w/ BSO and pelvic lymph node dissection's and radiation therapy   History of kidney stones    History of radiation therapy    2011  pelvic intracavity brachytherapy treatment's for endometrial carcinoma   History of thyroid  nodule    multinodular goiter s/p  total thyroidectomy 11-19-2015  per pathology -  adenomatoid nodules   Hyperlipidemia    Hypothyroidism, postsurgical    Insulin  dependent diabetes mellitus    Type 2   Left ureteral stone    Obesity    OSA (obstructive sleep apnea)    sleep study 2017 showed OSA negative (pt had OSA in 2011, and lost weight, no issues since)   Overactive bladder    Personality disorder (HCC)    Polyphagia(783.6)    PONV (postoperative nausea and vomiting)    after ear surgery only one time   Right lower quadrant pain    Umbilical hernia    Urgency of urination    UTI (urinary tract infection)    Patient Active Problem List   Diagnosis Date Noted   Radiculopathy, cervical region 01/26/2023   History of adenomatous polyp of colon 01/17/2022   Constipation 01/17/2022   Postoperative  hypothyroidism 10/13/2021   Thrombocytopenia (HCC) 09/09/2021   Iron  deficiency anemia due to chronic blood loss 06/24/2021   Status post bronchoscopy 04/20/2021   Pulmonary nodule 1 cm or greater in diameter 03/17/2021   Elevated levels of transaminase & lactic acid dehydrogenase 08/03/2020   Epigastric pain 08/03/2020   Liver nodule 08/03/2020   Obstruction of bile duct 08/03/2020   Cirrhosis of liver (HCC) 07/29/2020   Gastroparesis 07/29/2020   Irritable bowel syndrome 07/29/2020   Steatosis of liver 07/29/2020   MDD (major depressive disorder), recurrent episode, severe (HCC) 04/01/2020   Borderline personality disorder (HCC) 04/01/2020   Intermittent explosive disorder in adult 04/01/2020   Cholelithiasis with chronic cholecystitis 09/06/2017   Multinodular goiter (nontoxic) 11/17/2015   Diabetes mellitus without complication (HCC) 07/19/2014   GERD (gastroesophageal reflux disease) 07/19/2014   HLD (hyperlipidemia) 02/09/2010   Obstructive sleep apnea 02/09/2010   Allergic rhinitis 02/09/2010   History of uterine cancer 02/09/2010   Past Surgical History:  Procedure Laterality Date   ANTERIOR CERVICAL DECOMP/DISCECTOMY FUSION N/A 01/26/2023   Procedure: C5-6, C6-7 ANTERIOR CERVICAL DECOMPRESSION/DISCECTOMY FUSION 2 LEVELS;  Surgeon: Diedra Fowler, MD;  Location: [facility];  Service: Orthopedics;  Laterality: N/A;   BIOPSY  04/25/2022   Procedure: BIOPSY;  Surgeon: Genell Ken, MD;  Location: [facility];  Service: Gastroenterology;;  BIOPSY  04/10/2023   Procedure: BIOPSY;  Surgeon: Genell Ken, MD;  Location: [facility];  Service: Gastroenterology;;   BRONCHIAL BIOPSY  04/18/2021   Procedure: BRONCHIAL BIOPSIES;  Surgeon: Denson Flake, MD;  Location: [facility] ENDOSCOPY;  Service: Pulmonary;;   BRONCHIAL BRUSHINGS  04/18/2021   Procedure: BRONCHIAL BRUSHINGS;  Surgeon: Denson Flake, MD;  Location: [facility] ENDOSCOPY;  Service: Pulmonary;;   BRONCHIAL NEEDLE ASPIRATION BIOPSY   04/18/2021   Procedure: BRONCHIAL NEEDLE ASPIRATION BIOPSIES;  Surgeon: Denson Flake, MD;  Location: [facility];  Service: Pulmonary;;   CARDIOVASCULAR STRESS TEST  06/09/2008   normal nuclear study w/ no ischemia/  normal LV function and wall motion , ef 83%   CHOLECYSTECTOMY N/A 09/06/2017   Procedure: LAPAROSCOPIC CHOLECYSTECTOMY WITH INTRAOPERATIVE CHOLANGIOGRAM;  Surgeon: Oralee Billow, MD;  Location: [facility];  Service: General;  Laterality: N/A;   COLONOSCOPY WITH PROPOFOL  N/A 04/25/2022   Procedure: COLONOSCOPY WITH PROPOFOL ;  Surgeon: Genell Ken, MD;  Location: [facility];  Service: Gastroenterology;  Laterality: N/A;   CYSTOSCOPY/RETROGRADE/URETEROSCOPY/STONE EXTRACTION WITH BASKET Left 03/08/2016   Procedure: CYSTOSCOPY/RETROGRADE/URETEROSCOPY/STONE EXTRACTION WITH BASKET, STENT PLACEMENT;  Surgeon: Ann Barnacle, MD;  Location[facility];  Service: Urology;  Laterality: Left;   CYSTOSCOPY/URETEROSCOPY/HOLMIUM LASER/STENT PLACEMENT Left 07/27/2023   Procedure: CYSTOSCOPYLEFT/URETEROSCOPY/HOLMIUM LASER/STENT PLACEMENT;  Surgeon: Samson Croak, MD;  Location: [facility];  Service: Urology;  Laterality: Left;   ENDOMETRIAL BIOPSY     ERCP N/A 09/07/2017   Procedure: ENDOSCOPIC RETROGRADE CHOLANGIOPANCREATOGRAPHY (ERCP);  Surgeon: Genell Ken, MD;  Location: [facility];  Service: Gastroenterology;  Laterality: N/A;   ESOPHAGEAL BANDING  04/10/2023   Procedure: ESOPHAGEAL BANDING;  Surgeon: Genell Ken, MD;  Location: [facility];  Service: Gastroenterology;;   ESOPHAGOGASTRODUODENOSCOPY N/A 04/25/2022   Procedure: ESOPHAGOGASTRODUODENOSCOPY (EGD);  Surgeon: Genell Ken, MD;  Location: [facility];  Service: Gastroenterology;  Laterality: N/A;   ESOPHAGOGASTRODUODENOSCOPY N/A 04/10/2023   Procedure: ESOPHAGOGASTRODUODENOSCOPY (EGD);  Surgeon: Genell Ken, MD;  Location: [facility];  Service: Gastroenterology;  Laterality: N/A;  with possible banding for esophageal  varices   ESOPHAGOGASTRODUODENOSCOPY (EGD) WITH PROPOFOL  N/A 03/16/2020   Procedure: ESOPHAGOGASTRODUODENOSCOPY (EGD) WITH PROPOFOL ;  Surgeon: Genell Ken, MD;  Location: [facility];  Service: Gastroenterology;  Laterality: N/A;  unable to locate ampulla, aborted ERCP, changed to EGD   HEMOSTASIS CLIP PLACEMENT  04/25/2022   Procedure: HEMOSTASIS CLIP PLACEMENT;  Surgeon: Genell Ken, MD;  Location: [facility];  Service: Gastroenterology;;   HEMOSTASIS CONTROL  04/25/2022   Procedure: HEMOSTASIS CONTROL;  Surgeon: Genell Ken, MD;  Location: [facility];  Service: Gastroenterology;;   HOLMIUM LASER APPLICATION Left 03/08/2016   Procedure: HOLMIUM LASER APPLICATION;  Surgeon: Ann Barnacle, MD;  Location: [facility];  Service: Urology;  Laterality: Left;   MOUTH SURGERY     MYRINGECOTMY W/ REMOVAL MIDDLE EAR CHOLESTEATOMA TYPE 1 FASICA TYMPANOPLASTY  09/13/2000   POLYPECTOMY  04/25/2022   Procedure: POLYPECTOMY;  Surgeon: Genell Ken, MD;  Location: [facility];  Service: Gastroenterology;;   ROBOTIC ASSISTED TOTAL HYSTERECTOMY WITH BILATERAL SALPINGO OOPHERECTOMY  08-02-2009   at [facility]  dr [facility] [facility]   w/  Bilateral pelvic and para aortic lymph node dissection's   THYROIDECTOMY N/A 11/19/2015   Procedure: TOTAL THYROIDECTOMY;  Surgeon: Oralee Billow, MD;  Location: [facility];  Service: General;  Laterality: N/A;   TONSILLECTOMY  age 41   TRANSTHORACIC ECHOCARDIOGRAM  07/19/2014   ef 55-60%/  trivial TR   TYMPANOPLASTY Right 1993   VIDEO BRONCHOSCOPY WITH ENDOBRONCHIAL  NAVIGATION Left 04/18/2021   Procedure: ROBOTIC ASSISTED BRONCHOSCOPY WITH ENDOBRONCHIAL NAVIGATION;  Surgeon: Denson Flake, MD;  Location: [facility] ENDOSCOPY;  Service: Pulmonary;  Laterality: Left;   VIDEO BRONCHOSCOPY WITH RADIAL ENDOBRONCHIAL ULTRASOUND  04/18/2021   Procedure: RADIAL ENDOBRONCHIAL ULTRASOUND;  Surgeon: Denson Flake, MD;  Location: [facility]OS[facility];  Service: Pulmonary;;   Current Outpatient Medications on  File Prior to Visit  Medication Sig Dispense Refill   ACCRUFER 30 MG CAPS Take 30 mg by mouth once a week.     ACCU-CHEK GUIDE test strip      Accu-Chek Softclix Lancets lancets daily.     atorvastatin  (LIPITOR) 40 MG tablet Take 40 mg by mouth daily.     buPROPion  (WELLBUTRIN  XL) 300 MG 24 hr tablet Take 300 mg by mouth daily.     cetirizine (ZYRTEC) 10 MG tablet Take 10 mg by mouth daily as needed for allergies.     cyclobenzaprine  (FLEXERIL ) 10 MG tablet Take 1 tablet (10 mg total) by mouth 3 (three) times daily as needed (pain, muscle spasms). 50 tablet 0   dicyclomine (BENTYL) 20 MG tablet Take 20 mg by mouth 4 (four) times daily as needed for spasms.     gabapentin  (NEURONTIN ) 400 MG capsule Take 400-1,200 mg by mouth See admin instructions. Takes 400 mg in the morning 400 mg in the afternoon and 1200 mg at night     hydrOXYzine  (ATARAX ) 25 MG tablet Take 25 mg by mouth 3 (three) times daily as needed for anxiety.     insulin  glargine (LANTUS ) 100 UNIT/ML injection Inject 18 Units into the skin at bedtime.     lidocaine  (XYLOCAINE ) 2 % solution Use as directed 10 mLs in the mouth or throat every 4 (four) hours as needed for mouth pain. 100 mL 0   lisinopril  (PRINIVIL ,ZESTRIL ) 5 MG tablet Take 5 mg by mouth daily.     metFORMIN  (GLUCOPHAGE -XR) 500 MG 24 hr tablet Take 500 mg by mouth 2 (two) times daily.     metoCLOPramide  (REGLAN ) 10 MG tablet Take 1 tablet (10 mg total) by mouth every 6 (six) hours. 30 tablet 0   ondansetron  (ZOFRAN -ODT) 4 MG disintegrating tablet Take 4 mg by mouth every 6 (six) hours as needed for vomiting or nausea.     OZEMPIC, 1 MG/DOSE, 4 MG/3ML SOPN Inject 1 mg into the skin every Monday.     pantoprazole  (PROTONIX ) 40 MG tablet Take 40 mg by mouth 2 (two) times daily.   0   risperiDONE  (RISPERDAL ) 2 MG tablet Take 2 mg by mouth at bedtime.   2   sucralfate  (CARAFATE ) 1 g tablet Take 1 tablet (1 g total) by mouth 4 (four) times daily -  with meals and at bedtime.  120 tablet 0   SYNTHROID  175 MCG tablet Take 175 mcg by mouth daily.     tamsulosin  (FLOMAX ) 0.4 MG CAPS capsule Take 1 capsule (0.4 mg total) by mouth daily. 15 capsule 1   traZODone  (DESYREL ) 50 MG tablet Take 1 tablet (50 mg total) by mouth at bedtime. (Patient taking differently: Take 50 mg by mouth at bedtime as needed for sleep.)     Vitamin D, Ergocalciferol, (DRISDOL) 1.25 MG (50000 UNIT) CAPS capsule Take 50,000 Units by mouth once a week.     No current facility-administered medications on file prior to visit.    Allergies  Allergen Reactions   Hydrocodone  Shortness Of Breath and Itching   Tape Rash    Paper tape  is ok   Wound Dressing Adhesive Rash   Pholcodine Itching   Codeine Itching   Social History   Occupational History   Occupation: n/a  Tobacco Use   Smoking status: Never   Smokeless tobacco: Never  Vaping Use   Vaping status: Never Used  Substance and Sexual Activity   Alcohol use: No   Drug use: No   Sexual activity: Yes    Birth control/protection: None   Family History  Problem Relation Age of Onset   Heart disease Sister    Heart attack Brother    Heart disease Brother    Heart disease Sister    Diabetes Sister    Breast cancer Sister    Asthma Mother    Heart disease Mother    Diabetes Mother    Emphysema Mother    Hypertension Mother    Stroke Mother    Prostate cancer Father    There is no immunization history for the selected administration types on file for this patient.   Review of Systems: Negative except as noted in the HPI.   Objective: There were no vitals filed for this visit.  Rhonda Higgins is a pleasant 53 y.o. female in NAD. AAO X 3.  Diabetic foot exam was performed with the following findings:   Vascular Examination: Capillary refill time immediate b/l. Vascular status intact b/l with palpable pedal pulses. Pedal hair present b/l. No pain with calf compression b/l. Skin temperature gradient WNL b/l. No  cyanosis or clubbing b/l. No ischemia or gangrene noted b/l.   Neurological Examination: Sensation grossly intact b/l with 10 gram monofilament. Vibratory sensation intact b/l. Pt has subjective symptoms of neuropathy.  Dermatological Examination: Pedal skin with normal turgor, texture and tone b/l.  No open wounds. No interdigital macerations.   Toenails 1-5 b/l thick, discolored, elongated with subungual debris and pain on dorsal palpation.   No corns, calluses nor porokeratotic lesions noted.  Musculoskeletal Examination: Muscle strength 5/5 to all lower extremity muscle groups bilaterally. No pain, crepitus or joint limitation noted with ROM bilateral LE. No gross bony deformities bilaterally.  Radiographs: None     Lab Results  Component Value Date   HGBA1C 7.4 (H) 04/03/2020   ADA Risk Categorization: Low Risk :  Patient has all of the following: Intact protective sensation No prior foot ulcer  No severe deformity Pedal pulses present  Assessment: 1. Pain due to onychomycosis of toenails of both feet   2. Diabetic peripheral neuropathy associated with type 2 diabetes mellitus (HCC)   3. Encounter for diabetic foot exam [facility])     Plan: Consent given for treatment. Diabetic foot examination performed.. All patient's and/or POA's questions/concerns addressed on today's visit. Mycotic toenails 1-5 debrided in length and girth without incident. Continue foot and shoe inspections daily. Monitor blood glucose per PCP/Endocrinologist's recommendations.Continue soft, supportive shoe gear daily. Report any pedal injuries to medical professional. Call office if there are any quesitons/concerns. -Patient/POA to call should there be question/concern in the interim. Return in about 3 months (around 01/15/2024).  Luella Sager, DPM      [facility] LOCATION: [address], [place] [address]  Office [phone]   [facility] LOCATION: [address] [place], [place] [address] Office [phone]

## 2023-10-30 ENCOUNTER — Encounter

## 2023-11-06 ENCOUNTER — Ambulatory Visit: Admitting: Orthopedic Surgery

## 2023-11-06 DIAGNOSIS — M25522 Pain in left elbow: Secondary | ICD-10-CM | POA: Diagnosis not present

## 2023-11-06 NOTE — Progress Notes (Signed)
 Orthopedic Surgery Progress Note     Assessment: Patient is a 53 y.o. female with right elbow and right knee pain after a fall.  Pain has gotten significantly better since she was last seen   Plan: -No operative intervention recommended at this time -Continue with Tylenol  as needed for pain relief -Can use Voltaren gel and ice over the elbow when is hurting -No activity restrictions at this time - Follow-up in 3 months, x-rays at next visit: AP/lateral/Flex/ex cervical   ___________________________________________________________________________   Subjective: Patient has noticed significant improvement in her elbow and right knee pain since her fall.  She is not having any significant pain at the moment.  She still periodically gets pain in the elbow but it resolves.  Not having any rib pain.  She is taking Tylenol  as needed for pain relief.     Physical Exam:   General: no acute distress, appears stated age Neurologic: alert, answering questions appropriately, following commands Respiratory: unlabored breathing on room air, symmetric chest rise Psychiatric: appropriate affect, normal cadence to speech   MSK:    -Right upper extremity             No tenderness to palpation over the elbow, able to take the elbow through full range of motion, negative Yergason's, no pain with pronation or supination, no ecchymosis or swelling seen Fires deltoid, biceps, triceps, wrist extensors, wrist flexors, finger extensors, finger flexors             AIN/PIN/IO intact             Palpable radial pulse             Sensation intact to light touch in median/ulnar/radial/axillary nerve distributions             Hand warm and well perfused   -Right lower extremity             No tenderness to palpation over the extremity, no pain with logroll, no ecchymosis or breaks in the skin seen, no swelling seen Fires hip flexors, quadriceps, hamstrings, tibialis anterior, gastrocnemius and soleus, extensor  hallucis longus Plantarflexes and dorsiflexes toes Sensation intact to light touch in sural, saphenous, tibial, deep peroneal, and superficial peroneal nerve distributions Foot warm and well perfused   Imaging: XRs of the right elbow from 10/07/2023 were previously independently reviewed and interpreted, showing no fracture or dislocation.  No effusion seen within the elbow joint.     Patient name: Rhonda Higgins Patient MRN: [id number] Date: 11/06/23

## 2023-11-08 ENCOUNTER — Telehealth: Payer: Self-pay | Admitting: Orthopedic Surgery

## 2023-11-08 NOTE — Telephone Encounter (Signed)
 Pt states she went to her pcp and she does not have kidney stones, she is still having severe pain and wants to discuss her options

## 2023-11-09 NOTE — Telephone Encounter (Signed)
 Called patient appt, made appt.

## 2023-11-14 ENCOUNTER — Encounter (HOSPITAL_COMMUNITY): Payer: Self-pay | Admitting: Gastroenterology

## 2023-11-14 NOTE — Progress Notes (Signed)
 Attempted to obtain medical history for pre op call via telephone, unable to reach at this time. HIPAA compliant voicemail message left requesting return call to pre surgical testing department.

## 2023-11-15 ENCOUNTER — Other Ambulatory Visit (INDEPENDENT_AMBULATORY_CARE_PROVIDER_SITE_OTHER): Payer: Self-pay

## 2023-11-15 ENCOUNTER — Ambulatory Visit: Admitting: Orthopedic Surgery

## 2023-11-15 VITALS — BP 126/78 | HR 56

## 2023-11-15 DIAGNOSIS — M545 Low back pain, unspecified: Secondary | ICD-10-CM | POA: Diagnosis not present

## 2023-11-15 MED ORDER — CYCLOBENZAPRINE HCL 10 MG PO TABS: 10.0000 mg | ORAL_TABLET | Freq: Three times a day (TID) | ORAL | 0 refills | Status: DC | PRN

## 2023-11-15 MED ORDER — HYDROCODONE-ACETAMINOPHEN 5-325 MG PO TABS: 1.0000 | ORAL_TABLET | Freq: Four times a day (QID) | ORAL | 0 refills | Status: AC | PRN

## 2023-11-15 NOTE — Progress Notes (Signed)
 Orthopedic Spine Surgery Office Note  Assessment: Patient is a 53 y.o. female with low back pain that radiates into the left buttock and the right anterior thigh   Plan: -Explained that initially conservative treatment is tried as a significant number of patients may experience relief with these treatment modalities. Discussed that the conservative treatments include:  -activity modification  -physical therapy  -over the counter pain medications  -medrol  dosepak  -lumbar steroid injections -Patient has tried Tylenol , Medrol  Dosepak -Flexeril  and Norco were prescribed to try as additional non-operative treatments -If she is not doing any better at her next visit, we will order an MRI of the lumbar spine to evaluate further  -Patient should return to office in 6 weeks, x-rays at next visit: None   Patient expressed understanding of the plan and all questions were answered to the patient's satisfaction.   ___________________________________________________________________________  History: Patient is a 53 y.o. female who comes in today to talk about her lumbar spine.  Patient has had several weeks of low back pain.  She feels it in the lower lumbar region.  She said it will periodically radiate into the left buttock and into the right anterior thigh.  She does not have any pain radiating past those points.  There was no trauma or injury that preceded the onset of pain.  She said the pain is constant she notes daily basis.  She feels it with activity and at rest.    Previous treatments: Tylenol , Medrol  Dosepak   Physical Exam:  General: no acute distress, appears stated age Neurologic: alert, answering questions appropriately, following commands Respiratory: unlabored breathing on room air, symmetric chest rise Psychiatric: appropriate affect, normal cadence to speech   MSK (spine):  -Strength exam      Left  Right EHL    5/5  5/5 TA    5/5  5/5 GSC    5/5  5/5 Knee extension   5/5  5/5 Hip flexion   5/5  5/5  -Sensory exam    Sensation intact to light touch in L3-S1 nerve distributions of bilateral lower extremities  Imaging: XRs of the lumbar spine from 11/15/2023 were independently reviewed and interpreted, showing disc height loss at L5/S1.  No other significant degenerative changes.  No fracture or dislocation seen.  No evidence of instability on flexion/extension views.   Patient name: Rhonda Higgins [place] Patient MRN: [id number] Date of visit: 11/15/23

## 2023-11-19 ENCOUNTER — Inpatient Hospital Stay: Attending: Internal Medicine

## 2023-11-19 DIAGNOSIS — D696 Thrombocytopenia, unspecified: Secondary | ICD-10-CM | POA: Diagnosis not present

## 2023-11-19 DIAGNOSIS — D509 Iron deficiency anemia, unspecified: Secondary | ICD-10-CM | POA: Insufficient documentation

## 2023-11-19 DIAGNOSIS — K766 Portal hypertension: Secondary | ICD-10-CM | POA: Diagnosis not present

## 2023-11-19 DIAGNOSIS — K7460 Unspecified cirrhosis of liver: Secondary | ICD-10-CM | POA: Insufficient documentation

## 2023-11-19 DIAGNOSIS — D500 Iron deficiency anemia secondary to blood loss (chronic): Secondary | ICD-10-CM

## 2023-11-19 DIAGNOSIS — I8510 Secondary esophageal varices without bleeding: Secondary | ICD-10-CM | POA: Insufficient documentation

## 2023-11-19 LAB — CBC WITH DIFFERENTIAL (CANCER CENTER ONLY)
Abs Immature Granulocytes: 0.01 10*3/uL (ref 0.00–0.07)
Basophils Absolute: 0 10*3/uL (ref 0.0–0.1)
Basophils Relative: 1 %
Eosinophils Absolute: 0.1 10*3/uL (ref 0.0–0.5)
Eosinophils Relative: 2 %
HCT: 38.2 % (ref 36.0–46.0)
Hemoglobin: 12.1 g/dL (ref 12.0–15.0)
Immature Granulocytes: 0 %
Lymphocytes Relative: 20 %
Lymphs Abs: 0.6 10*3/uL — ABNORMAL LOW (ref 0.7–4.0)
MCH: 26 pg (ref 26.0–34.0)
MCHC: 31.7 g/dL (ref 30.0–36.0)
MCV: 82.2 fL (ref 80.0–100.0)
Monocytes Absolute: 0.2 10*3/uL (ref 0.1–1.0)
Monocytes Relative: 6 %
Neutro Abs: 2.1 10*3/uL (ref 1.7–7.7)
Neutrophils Relative %: 71 %
Platelet Count: 67 10*3/uL — ABNORMAL LOW (ref 150–400)
RBC: 4.65 MIL/uL (ref 3.87–5.11)
RDW: 14.6 % (ref 11.5–15.5)
WBC Count: 3 10*3/uL — ABNORMAL LOW (ref 4.0–10.5)
nRBC: 0 % (ref 0.0–0.2)

## 2023-11-19 LAB — IRON AND IRON BINDING CAPACITY (CC-WL,HP ONLY)
Iron: 38 ug/dL (ref 28–170)
Saturation Ratios: 9 % — ABNORMAL LOW (ref 10.4–31.8)
TIBC: 430 ug/dL (ref 250–450)
UIBC: 392 ug/dL (ref 148–442)

## 2023-11-19 LAB — FERRITIN: Ferritin: 16 ng/mL (ref 11–307)

## 2023-11-19 NOTE — H&P (Signed)
 History of Present Illness    General:  09/17/2023 ED visit: -pmh of cirrhosis, thormbocytopenia,DM, epigastric pain, IBS, and gastroparesis. She has been having several days of epigastric postprandial abdominal pain and nausea without vomiting. She has tried Zofran  without relief. She was told to take IBGuard otc by her GI specialist, "but it has not helped." She is s/p cholecystectomy. She denies vomiting, diarrhea, constipation, fever, melena, hematochezia. Differential diagnosis:  Epigastric pain ( gastritis/ Pud Gastroparesis?)I ordered medication including gi cocktail and reglan  for pain and nausea Reevaluation of the patient after these medicines showed that the patient resolved. On PPI Add carafate  and viscous lidocaine  + reglan . No imaging done. 09/17/2023- Chronic low white blood cell count. Hgb 13.74 and WBC 3.1. WBC has been as low as 2.9. Platelet 82. Has been as low as 73. Lipase 30. Liver enzymes are normal. Ferritin 23 normal, Iron  43 normal on 08/21/2023.       Last seen Dr. Feliberto Hopping on 05/22/2023. EGD 04/10/2023 at [facility]: Grade 2 esophageal varices, 3 bands placed, gastric ulcers, biopsies negative for H. pylori intestinal metaplasia, portal hypertensive gastropathy She had a lot of esophageal pain post banding and needed sucralfate  and lidocaine  suspension for multiple weeks. 07/14/2023:  CT abdomen and pelvis with contrast showed changes consistent with cirrhosis of the liver with esophageal and gastric varices. CT 09/01/22: cirrhosis, portal HTN, splenomegaly, 9mm renal stone EGD 04/2022:grade 1 esophageal varices, portal HTNsive gastropathy, gastric hyperplastic polyp, repeat in 1 year Colon 04/2022, surveillance: 9 mm transverse polyp removed(TA), diverticulosis in sigmoid and transverse, IH,repeat in 5 yrs Labs on epic from 04/06/2023 showed sodium 140, potassium 3.5, BUN 6, creatinine 0.58, GFR more than 80, T. bili/AST/ALT/ALP of point 02/03/14/81, hemoglobin 9.5, MCV 68.5,  platelet 99 CT abdomen pelvis with contrast 04/06/2023: Small periumbilical and ventral epigastric hernias left nephrolithiasis, cirrhosis and portal hypertension  Currently she says the Reglan  made have urinary frequency every 15 minutes. The nausea is intermittent. She says the Zofran  helps for 30 minutes then the nausea returns. No vomiting. She has regular BMs, 1/day, denies blood in stool or black stools. She continues to have intermittent cramping , labor like pain that last for 15-20 mins. She takes Dicyclomine as needed. She says the Dicyclomine helps for an hour but then the pain comes back. The abdominal pain is lower abdomen area.  She has nausea, she restarted Ozempic October 2024,  she takes pantoprazole  40 mg BID, otherwise had severe heartburn with nexium. Currently, does not have issues with swallowing.  Current Medications Taking Atorvastatin  Calcium  40 MG Tablet 1 tablet Orally Once a day Dicyclomine HCl 20 MG Tablet 1 tablet Orally four times a day as needed Gabapentin  400 MG Capsule 1 capsule in the morning, 1 capsule in the afternoon and 3 capsules at night Orally LaMICtal (lamoTRIgine ) 200 MG Tablet 1 tablet Orally Once a day Lantus (Insulin  Glargine) 100 UNIT/ML Solution 20 units Subcutaneous nightly Lisinopril  5 MG Tablet 1 tablet Orally Once a day metFORMIN  HCl 500 MG Tablet 1 tablet with a meal Orally Twice a day Ondansetron  4 MG Tablet Disintegrating 1 tablet on the tongue and allow to dissolve Orally every 6 hours as needed prn nausea Ozempic (0.25 or 0.5 MG/DOSE)(Semaglutide(0.25 or 0.5MG /DOS)) 2 MG/3ML Solution Pen-injector 1 injection Subcutaneous on Sundays Pantoprazole  Sodium 40 MG Tablet Delayed Release TAKE 1 TABLET BY MOUTH TWICE A DAY PROzac(FLUoxetine HCl) 20 MG Capsule 1 capsule Orally Once a day risperiDONE  1 MG Tablet 1 tablet Orally Once a day traMADol  HCl  50 MG Tablet 1 tablet as needed Orally Once a day traZODone  HCl 50 MG Tablet 1 tablet at bedtime as  needed Orally Once a day Vitamin D (Ergocalciferol) 50000 UNIT Capsule 1 capsule Orally once a week ZyrTEC 10 MG Tablet 1 tablet Orally Once a day as needed Not-Taking Hyoscyamine  Sulfate 0.125 MG Tablet TAKE 1 TABLET BY MOUTH FOUR TIMES A DAY AS NEEDED FOR 30 DAYS IBgard(Peppermint Oil) 90 MG Capsule Extended Release 2 capsules Orally once a day Janumet  XR 50/1000 mg Tablet 1 tablets by month twice a day MiraLax  17g Powder 1 capful Orally twice a day Sucralfate  1 GM Tablet 1 tablet on an empty stomach Orally Twice a day Sucralfate  1 GM/10ML Suspension 10 mL 1 hour before meals and at bedtime on an empty stomach Orally Four times a day Wellbutrin  XL(buPROPion  HCl ER (XL)) 300 MG Tablet Extended Release 24 Hour 1 tablet in the morning Orally Once a day Medication List reviewed and reconciled with the patient Past Medical History High cholestral. Borderline personality disorder. Hyperlipidemia. Seborrheic dermatitis. Diabetes mellitus. Goiter. Allergic rhinitis. Otitis media. Obesity. recurrent UTIs. Metabolic syndrome. Liver cirohosis. Kidney stones. Bursitis in L shoulder. Bursitis, arthritis, and frozen in R shoulder. lung nodule LT. Depression. Anxiety. Surgical History Total hysterectomy Ear surgery-both EGD 07/2015 Kidney stone removed 11/2015 Goiter and thyroid  removed 10/2015 Cholecystectomy/ gall stones 08/2017 colonoscopy/EGD 2020 neck surgery 01/2023 Family History Father: deceased, Prostate cancer, diagnosed with Prostate CA Mother: deceased, diagnosed with CVA, Hypertension, Coronary artery disease, Diabetes No family history of colon cancer,colon polyps, or liver disease. Social History    General:  Tobacco use  cigarettes: Never smoked Tobacco history last updated 09/24/2023 Vaping No EXPOSURE TO PASSIVE SMOKE: no. Alcohol: no. Recreational drug use: no. Allergies Codeine Sulfate: itching, SOB - Allergy Vicodin: itching, SOB - Allergy Tape: Hives -  Allergy Hospitalization/Major Diagnostic Procedure ER for abd pain 05/2015 Cholecystectomy, gall stones 08/2017 neck surgery 01/2023 Not in the past year/ER visit 3/31 Abd. pain 09/2023   Vital Signs Wt: 207, Wt change: -0.8 lbs, Ht: 63, BMI: 36.66, Pulse sitting: 60, BP sitting: 126/80. Assessments 1. Acute gastritis - K29.00 (Primary)    2. Acquired thrombocytopenia - D69.6    3. Chronic iron  deficiency anemia - D50.9    4. Cirrhosis - K74.60    5. Abdominal cramping, generalized - R10.84    6. Chronic nausea - R11.0     Treatment 1. Acute gastritis        Notes: Patient could not tolerate the Reglan . She said it causes frequent urination. Continue Carafate  up to 4 times a day. Patient says she could not tell whether it helped or not but she only took a couple of doses. Discussed with patient that she needs to take it 3-4 times a day scheduled to really know whether is going to work.    2. Acquired thrombocytopenia        Notes: Cameron Cea, MD (Hematology and Oncology) note on 08/23/2023: -Secondary to cirrhosis of the liver with portal hypertension and esophageal varices requiring banding Lab review: 07/19/2014: Platelets 134 11/14/2017: Platelets 103 03/31/2020: Platelets 109 04/18/2021: Platelets 81 08/27/2021: Platelets 79 09/09/2021: Platelets 82, B12 246, immature platelet fraction: 2.9% The low immature platelet fraction rules out ITP. Mild thrombocytopenia: Most likely related to cirrhosis of the liver. 03/17/22: Platelet count 85 04/06/2023: Platelets 99, hemoglobin 9.5, MCV 68.5 08/21/2023: Platelets 73, hemoglobin 12.6, WBC 2.9, iron  saturation 10%, ferritin 23 Slightly low B12 levels: Encouraged her to take  1000 mcg of sublingual B12 Ultrasound abdomen 04/19/2021: Cirrhosis of liver Iron  deficiency anemia due to chronic blood loss IV iron : November 2024 Lab review: 08/21/2023: Platelets 73, hemoglobin 12.6, WBC 2.9, iron  saturation 10%, ferritin 23 Recheck labs  in 3 months by Hematology.    3. Chronic iron  deficiency anemia        Notes: Cameron Cea, MD (Hematology and Oncology) note 08/23/2023: -IV iron : November 2024 Lab review: 08/21/2023: Platelets 73, hemoglobin 12.6, WBC 2.9, iron  saturation 10%, ferritin 23 Recheck labs in 3 months by Hematology.    4. Cirrhosis      IMAGING: EGD    Notes: CBC and CMP done at the hospital in January 2025. Ordered LAB: PT (Prothrombin Time) Ordered LAB: AFP, Serum, Tumor Marker Patient does not need ultrasound of the abdomen, CAT scan was done in a hospital in January 2025. IMAGING: EGD with Band Ligation of Varices: Louanna Rouse MA will let patient know when hospital time for June or July for EGD with banding is available.   5. Abdominal cramping, generalized        Notes: This is likely multifactorial. Patient has irritable bowel syndrome, multiple paraumbilical hernias and epigastric ventral hernias, she has diabetes likely has neuropathy. Advised patient to take dicyclomine 20 mg up to 4 times a day when she develops yeast pain described as spasm and labor like.    6. Chronic nausea        Start Promethazine  HCl Tablet, 12.5 MG, 1 tablet as needed, Orally, every 6 hrs, 30 days, 120, Refills 0

## 2023-11-20 ENCOUNTER — Ambulatory Visit (HOSPITAL_COMMUNITY): Admitting: Anesthesiology

## 2023-11-20 ENCOUNTER — Encounter (HOSPITAL_COMMUNITY)
Admission: RE | Disposition: A | Discharge status: Home / Self Care | Payer: Self-pay | Source: Home / Self Care | Attending: Gastroenterology

## 2023-11-20 ENCOUNTER — Encounter (HOSPITAL_COMMUNITY): Payer: Self-pay | Admitting: Gastroenterology

## 2023-11-20 ENCOUNTER — Ambulatory Visit (HOSPITAL_COMMUNITY)
Admission: RE | Admit: 2023-11-20 | Discharge: 2023-11-20 | Disposition: A | Discharge status: Home / Self Care | Attending: Gastroenterology | Admitting: Gastroenterology

## 2023-11-20 ENCOUNTER — Other Ambulatory Visit: Payer: Self-pay

## 2023-11-20 DIAGNOSIS — Z7985 Long-term (current) use of injectable non-insulin antidiabetic drugs: Secondary | ICD-10-CM | POA: Insufficient documentation

## 2023-11-20 DIAGNOSIS — Z8542 Personal history of malignant neoplasm of other parts of uterus: Secondary | ICD-10-CM | POA: Insufficient documentation

## 2023-11-20 DIAGNOSIS — K3189 Other diseases of stomach and duodenum: Secondary | ICD-10-CM

## 2023-11-20 DIAGNOSIS — Z7984 Long term (current) use of oral hypoglycemic drugs: Secondary | ICD-10-CM | POA: Diagnosis not present

## 2023-11-20 DIAGNOSIS — F603 Borderline personality disorder: Secondary | ICD-10-CM | POA: Insufficient documentation

## 2023-11-20 DIAGNOSIS — E1143 Type 2 diabetes mellitus with diabetic autonomic (poly)neuropathy: Secondary | ICD-10-CM | POA: Diagnosis not present

## 2023-11-20 DIAGNOSIS — R1084 Generalized abdominal pain: Secondary | ICD-10-CM | POA: Diagnosis not present

## 2023-11-20 DIAGNOSIS — Z79899 Other long term (current) drug therapy: Secondary | ICD-10-CM | POA: Diagnosis not present

## 2023-11-20 DIAGNOSIS — K2900 Acute gastritis without bleeding: Secondary | ICD-10-CM | POA: Insufficient documentation

## 2023-11-20 DIAGNOSIS — K317 Polyp of stomach and duodenum: Secondary | ICD-10-CM | POA: Insufficient documentation

## 2023-11-20 DIAGNOSIS — Z9049 Acquired absence of other specified parts of digestive tract: Secondary | ICD-10-CM | POA: Insufficient documentation

## 2023-11-20 DIAGNOSIS — K429 Umbilical hernia without obstruction or gangrene: Secondary | ICD-10-CM | POA: Insufficient documentation

## 2023-11-20 DIAGNOSIS — K7460 Unspecified cirrhosis of liver: Secondary | ICD-10-CM | POA: Insufficient documentation

## 2023-11-20 DIAGNOSIS — K766 Portal hypertension: Secondary | ICD-10-CM | POA: Diagnosis not present

## 2023-11-20 DIAGNOSIS — Z981 Arthrodesis status: Secondary | ICD-10-CM | POA: Diagnosis not present

## 2023-11-20 DIAGNOSIS — K219 Gastro-esophageal reflux disease without esophagitis: Secondary | ICD-10-CM | POA: Diagnosis not present

## 2023-11-20 DIAGNOSIS — K439 Ventral hernia without obstruction or gangrene: Secondary | ICD-10-CM | POA: Diagnosis not present

## 2023-11-20 DIAGNOSIS — D509 Iron deficiency anemia, unspecified: Secondary | ICD-10-CM | POA: Diagnosis not present

## 2023-11-20 DIAGNOSIS — I8510 Secondary esophageal varices without bleeding: Secondary | ICD-10-CM | POA: Diagnosis not present

## 2023-11-20 DIAGNOSIS — R110 Nausea: Secondary | ICD-10-CM | POA: Diagnosis not present

## 2023-11-20 DIAGNOSIS — Z794 Long term (current) use of insulin: Secondary | ICD-10-CM | POA: Diagnosis not present

## 2023-11-20 DIAGNOSIS — E785 Hyperlipidemia, unspecified: Secondary | ICD-10-CM | POA: Diagnosis not present

## 2023-11-20 DIAGNOSIS — Z09 Encounter for follow-up examination after completed treatment for conditions other than malignant neoplasm: Secondary | ICD-10-CM | POA: Diagnosis present

## 2023-11-20 DIAGNOSIS — I8500 Esophageal varices without bleeding: Secondary | ICD-10-CM | POA: Diagnosis not present

## 2023-11-20 DIAGNOSIS — K589 Irritable bowel syndrome without diarrhea: Secondary | ICD-10-CM | POA: Insufficient documentation

## 2023-11-20 DIAGNOSIS — D696 Thrombocytopenia, unspecified: Secondary | ICD-10-CM | POA: Insufficient documentation

## 2023-11-20 DIAGNOSIS — K3184 Gastroparesis: Secondary | ICD-10-CM | POA: Insufficient documentation

## 2023-11-20 HISTORY — PX: ESOPHAGOGASTRODUODENOSCOPY: SHX5428

## 2023-11-20 LAB — GLUCOSE, CAPILLARY: Glucose-Capillary: 119 mg/dL — ABNORMAL HIGH (ref 70–99)

## 2023-11-20 SURGERY — EGD (ESOPHAGOGASTRODUODENOSCOPY)
Anesthesia: Monitor Anesthesia Care

## 2023-11-20 MED ORDER — PROPOFOL 10 MG/ML IV BOLUS
INTRAVENOUS | Status: DC | PRN
  Administered 2023-11-20: 40 mg via INTRAVENOUS
  Administered 2023-11-20: 200 ug/kg/min via INTRAVENOUS

## 2023-11-20 MED ORDER — SODIUM CHLORIDE 0.9 % IV SOLN
INTRAVENOUS | Status: AC | PRN
  Administered 2023-11-20: 500 mL via INTRAMUSCULAR

## 2023-11-20 MED ORDER — LIDOCAINE 2% (20 MG/ML) 5 ML SYRINGE
INTRAMUSCULAR | Status: DC | PRN
  Administered 2023-11-20: 60 mg via INTRAVENOUS

## 2023-11-20 MED ORDER — SODIUM CHLORIDE 0.9% FLUSH: 3.0000 mL | Freq: Two times a day (BID) | INTRAVENOUS | Status: DC

## 2023-11-20 MED ORDER — SODIUM CHLORIDE 0.9% FLUSH: 3.0000 mL | INTRAVENOUS | Status: DC | PRN

## 2023-11-20 NOTE — Discharge Instructions (Signed)

## 2023-11-20 NOTE — Interval H&P Note (Signed)
 History and Physical Interval Note: 53 year old female with cirrhosis, history of esophageal varices requiring banding, for EGD with possible banding with propofol .  11/20/2023 8:47 AM  Kelcy L Bynum Applegarth  has presented today for EGD with possible banding with propofol  with the diagnosis of K74.60 Cirrhosis.  The various methods of treatment have been discussed with the patient and family. After consideration of risks, benefits and other options for treatment, the patient has consented to  Procedure(s): EGD (ESOPHAGOGASTRODUODENOSCOPY) (N/A) ESOPHAGOSCOPY, WITH ESOPHAGEAL VARICES BAND LIGATION (N/A) as a surgical intervention.  The patient's history has been reviewed, patient examined, no change in status, stable for surgery.  I have reviewed the patient's chart and labs.  Questions were answered to the patient's satisfaction.     Genell Ken

## 2023-11-20 NOTE — Transfer of Care (Signed)
 Immediate Anesthesia Transfer of Care Note  Patient: Rhonda Higgins  Procedure(s) Performed: EGD (ESOPHAGOGASTRODUODENOSCOPY) ESOPHAGOSCOPY, WITH ESOPHAGEAL VARICES BAND LIGATION  Patient Location: PACU  Anesthesia Type:MAC  Level of Consciousness: awake and alert   Airway & Oxygen Therapy: Patient Spontanous Breathing  Post-op Assessment: Report given to RN  Post vital signs: Reviewed and stable  Last Vitals:  Vitals Value Taken Time  BP    Temp    Pulse 75 11/20/23 1010  Resp 20 11/20/23 1010  SpO2 100 % 11/20/23 1010  Vitals shown include unfiled device data.  Last Pain:  Vitals:   11/20/23 0859  TempSrc: Temporal  PainSc: 0-No pain         Complications: No notable events documented.

## 2023-11-20 NOTE — Anesthesia Preprocedure Evaluation (Addendum)
 Anesthesia Evaluation  Patient identified by MRN, date of birth, ID band Patient awake    Reviewed: Allergy & Precautions, NPO status , Patient's Chart, lab work & pertinent test results  History of Anesthesia Complications (+) PONV and history of anesthetic complications  Airway Mallampati: III  TM Distance: >3 FB Neck ROM: Full    Dental  (+) Dental Advisory Given, Missing   Pulmonary shortness of breath (reports subjective SOB for the last month. Has appointment with pulmonologist in August. WOB is normal. SpO2 98% on RA. Lungs CTAB.), sleep apnea (not an issue since losing weight) , neg COPD, neg recent URI Nodule    Pulmonary exam normal breath sounds clear to auscultation       Cardiovascular (-) hypertension(-) angina (-) Past MI, (-) Cardiac Stents and (-) CABG (-) dysrhythmias  Rhythm:Regular Rate:Normal  HLD  TTE 07/20/2014: Study Conclusions   - Left ventricle: The cavity size was normal. Systolic function was    normal. The estimated ejection fraction was in the range of 55%    to 60%. Wall motion was normal; there were no regional wall    motion abnormalities. Left ventricular diastolic function    parameters were normal.  - Atrial septum: No defect or patent foramen ovale was identified.     Neuro/Psych neg Seizures PSYCHIATRIC DISORDERS (borderline personality disorder)  Depression     Neuromuscular disease (s/p ACDF)    GI/Hepatic ,GERD  Medicated,,(+) Cirrhosis   Esophageal Varices    Gastroparesis    Endo/Other  diabetes, Type 2, Insulin  Dependent, Oral Hypoglycemic AgentsHypothyroidism  H/o thyroid  nodule  Renal/GU      Musculoskeletal  (+) Arthritis ,    Abdominal  (+) + obese  Peds  Hematology  (+) Blood dyscrasia (thrombocytopenia) Lab Results      Component                Value               Date                      WBC                      3.0 (L)             11/19/2023                 HGB                      12.1                11/19/2023                HCT                      38.2                11/19/2023                MCV                      82.2                11/19/2023                PLT  67 (L)              11/19/2023              Anesthesia Other Findings Last Ozempic: 2 weeks ago  Reproductive/Obstetrics H/o endometrial cancer                             Anesthesia Physical Anesthesia Plan  ASA: 3  Anesthesia Plan: MAC   Post-op Pain Management: Minimal or no pain anticipated   Induction: Intravenous  PONV Risk Score and Plan: 3 and Propofol  infusion, TIVA and Treatment may vary due to age or medical condition  Airway Management Planned: Natural Airway and Simple Face Mask  Additional Equipment:   Intra-op Plan:   Post-operative Plan:   Informed Consent: I have reviewed the patients History and Physical, chart, labs and discussed the procedure including the risks, benefits and alternatives for the proposed anesthesia with the patient or authorized representative who has indicated his/her understanding and acceptance.     Dental advisory given  Plan Discussed with: CRNA and Anesthesiologist  Anesthesia Plan Comments: (Discussed with patient risks of MAC including, but not limited to, minor pain or discomfort, hearing people in the room, and possible need for backup general anesthesia. Risks for general anesthesia also discussed including, but not limited to, sore throat, hoarse voice, chipped/damaged teeth, injury to vocal cords, nausea and vomiting, allergic reactions, lung infection, heart attack, stroke, and death. All questions answered. )        Anesthesia Quick Evaluation

## 2023-11-20 NOTE — Op Note (Signed)
 [facility] Patient Name: Rhonda Higgins Applegarth Procedure Date: 11/20/2023 MRN: [id number] Attending MD: Genell Ken , MD, [id number] Date of Birth: Jul 03, 1970 CSN: [id number] Age: 53 Admit Type: Outpatient Procedure:                Upper GI endoscopy Indications:              Follow-up of esophageal varices Providers:                Genell Ken, MD, Suzann Ernst, RN, Rinda Cheers,                            Technician Referring MD:             Trellis Fries, MD Medicines:                Monitored Anesthesia Care Complications:            No immediate complications. Estimated Blood Loss:     Estimated blood loss: none. Procedure:                Pre-Anesthesia Assessment:                           - Prior to the procedure, a History and Physical                            was performed, and patient medications and                            allergies were reviewed. The patient's tolerance of                            previous anesthesia was also reviewed. The risks                            and benefits of the procedure and the sedation                            options and risks were discussed with the patient.                            All questions were answered, and informed consent                            was obtained. Prior Anticoagulants: The patient has                            taken no anticoagulant or antiplatelet agents. ASA                            Grade Assessment: III - A patient with severe                            systemic disease. After reviewing the risks and  benefits, the patient was deemed in satisfactory                            condition to undergo the procedure.                           After obtaining informed consent, the endoscope was                            passed under direct vision. Throughout the                            procedure, the patient's blood pressure, pulse, and                             oxygen saturations were monitored continuously. The                            [id number]-[id number] ([id number]) Olympus endoscope was introduced                            through the mouth, and advanced to the second part                            of duodenum. The upper GI endoscopy was                            accomplished without difficulty. The patient                            tolerated the procedure well. Scope In: Scope Out: Findings:      Grade I varices were found in the lower third of the esophagus. They       were small in size.      Severe, diffuse portal hypertensive gastropathy was found in the entire       examined stomach.      Multiple 4 to 8 mm sessile polyps with no stigmata of recent bleeding       were found in the cardia, in the gastric fundus and in the gastric body.      The examined duodenum was normal.      The cardia and gastric fundus were normal on retroflexion. Impression:               - Grade I esophageal varices.                           - Portal hypertensive gastropathy.                           - Multiple gastric polyps.                           - Normal examined duodenum.                           - No specimens collected. Moderate Sedation:  Patient did not receive moderate sedation for this procedure, but       instead received monitored anesthesia care. Recommendation:           - Patient has a contact number available for                            emergencies. The signs and symptoms of potential                            delayed complications were discussed with the                            patient. Return to normal activities tomorrow.                            Written discharge instructions were provided to the                            patient.                           - Advance diet as tolerated.                           - Continue present medications.                           - Repeat upper endoscopy in 1 year for  surveillance. Procedure Code(s):        --- Professional ---                           [phone], Esophagogastroduodenoscopy, flexible,                            transoral; diagnostic, including collection of                            specimen(s) by brushing or washing, when performed                            (separate procedure) Diagnosis Code(s):        --- Professional ---                           I85.00, Esophageal varices without bleeding                           K76.6, Portal hypertension                           K31.89, Other diseases of stomach and duodenum                           K31.7, Polyp of stomach and duodenum CPT copyright 2022 [employer]. All rights reserved. The codes documented in this report are preliminary and upon coder review may  be revised to meet current  compliance requirements. Genell Ken, MD 11/20/2023 10:07:38 AM This report has been signed electronically. Number of Addenda: 0

## 2023-11-20 NOTE — Anesthesia Postprocedure Evaluation (Signed)
 Anesthesia Post Note  Patient: Rhonda Higgins  Procedure(s) Performed: EGD (ESOPHAGOGASTRODUODENOSCOPY) ESOPHAGOSCOPY, WITH ESOPHAGEAL VARICES BAND LIGATION     Patient location during evaluation: PACU Anesthesia Type: MAC Level of consciousness: awake Pain management: pain level controlled Vital Signs Assessment: post-procedure vital signs reviewed and stable Respiratory status: spontaneous breathing, nonlabored ventilation and respiratory function stable Cardiovascular status: stable and blood pressure returned to baseline Postop Assessment: no apparent nausea or vomiting Anesthetic complications: no   No notable events documented.  Last Vitals:  Vitals:   11/20/23 1020 11/20/23 1030  BP: 130/62 136/78  Pulse: 62   Resp: 15 19  Temp:    SpO2: 98% 96%    Last Pain:  Vitals:   11/20/23 1030  TempSrc:   PainSc: 0-No pain                 Conard Decent

## 2023-11-21 ENCOUNTER — Encounter (HOSPITAL_COMMUNITY): Payer: Self-pay | Admitting: Gastroenterology

## 2023-11-22 ENCOUNTER — Inpatient Hospital Stay (HOSPITAL_BASED_OUTPATIENT_CLINIC_OR_DEPARTMENT_OTHER): Admitting: Hematology and Oncology

## 2023-11-22 DIAGNOSIS — D696 Thrombocytopenia, unspecified: Secondary | ICD-10-CM | POA: Diagnosis not present

## 2023-11-22 DIAGNOSIS — D500 Iron deficiency anemia secondary to blood loss (chronic): Secondary | ICD-10-CM

## 2023-11-22 NOTE — Progress Notes (Signed)
 HEMATOLOGY-ONCOLOGY TELEPHONE VISIT PROGRESS NOTE  I connected with our patient on 11/22/23 at 10:15 AM EDT by telephone and verified that I am speaking with the correct person using two identifiers.  I discussed the limitations, risks, security and privacy concerns of performing an evaluation and management service by telephone and the availability of in person appointments.  I also discussed with the patient that there may be a patient responsible charge related to this service. The patient expressed understanding and agreed to proceed.   History of Present Illness: Follow-up of iron  deficiency anemia  History of Present Illness Rhonda Higgins is a 53 year old female who presents with shortness of breath. She was referred by her primary care physician for evaluation by a pulmonologist.  She experiences shortness of breath for about a month, both at rest and with exertion, described as an inability to take a deep breath. Blood work reveals hemoglobin at 12.1, iron  at 9%, and ferritin at 16, indicating normal hemoglobin but slightly low iron  and ferritin levels. She does not experience cravings for ice chips, which she had previously.      REVIEW OF SYSTEMS:   Constitutional: Denies fevers, chills or abnormal weight loss All other systems were reviewed with the patient and are negative. Observations/Objective:     Assessment Plan:  Thrombocytopenia (HCC) Secondary to cirrhosis of the liver with portal hypertension and esophageal varices requiring banding   Lab review: 07/19/2014: Platelets 134 11/14/2017: Platelets 103 08/27/2021: Platelets 79 09/09/2021: Platelets 82, B12 246, immature platelet fraction: 2.9% The low immature platelet fraction rules out ITP. Mild thrombocytopenia: Most likely related to cirrhosis of the liver. 03/17/22: Platelet count 85    04/06/2023: Platelets 99, hemoglobin 9.5, MCV 68.5 08/21/2023: Platelets 73, hemoglobin 12.6, WBC 2.9, iron  saturation  10%, ferritin 23   Slightly low B12 levels: Encouraged her to take 1000 mcg of sublingual B12   Ultrasound abdomen 04/19/2021: Cirrhosis of liver   Iron  deficiency anemia due to chronic blood loss IV iron : November 2024 Lab review: 08/21/2023: Platelets 73, hemoglobin 12.6, WBC 2.9, iron  saturation 10%, ferritin 23  11/20/2023: Hemoglobin 12.1, MCV 82.2, iron  saturation 9%, ferritin 16, platelets 67   No need of IV iron  at this time. Recheck labs in 6 months and follow-up  Slight shortness of breath: Being managed by pulmonary and her primary care physician.  She does not have any fevers or cough or expectoration.  Un Related to Her Anemia since Her Hemoglobin Has Been  Normal  I discussed the assessment and treatment plan with the patient. The patient was provided an opportunity to ask questions and all were answered. The patient agreed with the plan and demonstrated an understanding of the instructions. The patient was advised to call back or seek an in-person evaluation if the symptoms worsen or if the condition fails to improve as anticipated.   I provided 20 minutes of non-face-to-face time during this encounter.  This includes time for charting and coordination of care   Margert Sheerer, MD

## 2023-11-22 NOTE — Assessment & Plan Note (Signed)
 Secondary to cirrhosis of the liver with portal hypertension and esophageal varices requiring banding   Lab review: 07/19/2014: Platelets 134 11/14/2017: Platelets 103 08/27/2021: Platelets 79 09/09/2021: Platelets 82, B12 246, immature platelet fraction: 2.9% The low immature platelet fraction rules out ITP. Mild thrombocytopenia: Most likely related to cirrhosis of the liver. 03/17/22: Platelet count 85    04/06/2023: Platelets 99, hemoglobin 9.5, MCV 68.5 08/21/2023: Platelets 73, hemoglobin 12.6, WBC 2.9, iron  saturation 10%, ferritin 23   Slightly low B12 levels: Encouraged her to take 1000 mcg of sublingual B12   Ultrasound abdomen 04/19/2021: Cirrhosis of liver   Iron  deficiency anemia due to chronic blood loss IV iron : November 2024 Lab review: 08/21/2023: Platelets 73, hemoglobin 12.6, WBC 2.9, iron  saturation 10%, ferritin 23  11/20/2023: Hemoglobin 12.1, MCV 82.2, iron  saturation 9%, ferritin 16, platelets 67   No need of IV iron  at this time. Recheck labs in 6 months and follow-up

## 2023-11-27 ENCOUNTER — Telehealth: Payer: Self-pay | Admitting: Orthopedic Surgery

## 2023-11-27 MED ORDER — OXYCODONE HCL 5 MG PO TABS: 2.5000 mg | ORAL_TABLET | ORAL | 0 refills | Status: AC | PRN

## 2023-11-27 NOTE — Telephone Encounter (Signed)
 Pt states the Hydrocodone  makes her itch and would like another medication called into the pharmacy

## 2023-12-17 ENCOUNTER — Telehealth: Payer: Self-pay | Admitting: Orthopedic Surgery

## 2023-12-17 DIAGNOSIS — M545 Low back pain, unspecified: Secondary | ICD-10-CM

## 2023-12-17 NOTE — Telephone Encounter (Signed)
Pt would like to be referred to pain management 

## 2023-12-19 ENCOUNTER — Telehealth: Payer: Self-pay | Admitting: Orthopedic Surgery

## 2023-12-19 NOTE — Telephone Encounter (Signed)
 Pt called requesting a call from [facility] concerning her pains in neck, legs, and back. Please call pt at [phone].

## 2023-12-19 NOTE — Telephone Encounter (Signed)
 I called, she states that she is having pain in her mid-back down to her knees, pain in her neck up into her head, also the hip that she had injected is starting to hurt again. She states that she is in so much pain that she can't sleep. Please advise.

## 2023-12-20 MED ORDER — HYDROCODONE-ACETAMINOPHEN 5-325 MG PO TABS: 1.0000 | ORAL_TABLET | ORAL | 0 refills | Status: AC | PRN

## 2023-12-26 ENCOUNTER — Other Ambulatory Visit: Payer: Self-pay | Admitting: Gastroenterology

## 2023-12-26 DIAGNOSIS — K745 Biliary cirrhosis, unspecified: Secondary | ICD-10-CM

## 2023-12-27 ENCOUNTER — Ambulatory Visit: Admitting: Orthopedic Surgery

## 2023-12-31 ENCOUNTER — Other Ambulatory Visit

## 2024-01-01 ENCOUNTER — Inpatient Hospital Stay
Admission: RE | Admit: 2024-01-01 | Discharge: 2024-01-01 | Discharge status: Home / Self Care | Source: Ambulatory Visit | Attending: Gastroenterology

## 2024-01-01 DIAGNOSIS — K745 Biliary cirrhosis, unspecified: Secondary | ICD-10-CM

## 2024-01-02 ENCOUNTER — Encounter: Payer: Self-pay | Admitting: Orthopedic Surgery

## 2024-01-02 ENCOUNTER — Ambulatory Visit: Admitting: Orthopedic Surgery

## 2024-01-02 DIAGNOSIS — M5416 Radiculopathy, lumbar region: Secondary | ICD-10-CM | POA: Diagnosis not present

## 2024-01-02 NOTE — Progress Notes (Signed)
 Orthopedic Spine Surgery Office Note   Assessment: Patient is a 53 y.o. female with low back pain that radiates into the left buttock and bilateral lateral thighs     Plan: -Patient has tried Tylenol , Medrol  Dosepak, Flexeril , Norco -Patient has tried over six weeks of conservative treatment including flexeril  and norco without improvement in her pain, so recommended a MRI of the lumbar spine to evaluate for radiculopathy -Continue with pain management -Patient should return to office in 4 weeks, x-rays at next visit: none     Patient expressed understanding of the plan and all questions were answered to the patient's satisfaction.    ___________________________________________________________________________   History: Patient is a 53 y.o. female who comes in today for follow up on her lumbar spine.  She continues to have low back pain that radiates into her bilateral thighs. It also radiates into her left buttock. She has not noticed any changes in her symptoms since she was last seen in the office. She had tried flexeril  and norco. He norco provided her with very temporary relief but the flexeril  did not do anything. She feels the pain with activity and rest.    Previous treatments: Tylenol , Medrol  Dosepak, Flexeril , Norco     Physical Exam:   General: no acute distress, appears stated age Neurologic: alert, answering questions appropriately, following commands Respiratory: unlabored breathing on room air, symmetric chest rise Psychiatric: appropriate affect, normal cadence to speech     MSK (spine):   -Strength exam                                                   Left                  Right EHL                              5/5                  5/5 TA                                 5/5                  5/5 GSC                             5/5                  5/5 Knee extension            5/5                  5/5 Hip flexion                    5/5                  5/5    -Sensory exam                           Sensation intact to light touch in L3-S1 nerve distributions of bilateral lower extremities   Imaging: XRs of the lumbar spine from 11/15/2023  were previously independently reviewed and interpreted, showing disc height loss at L5/S1.  No other significant degenerative changes.  No fracture or dislocation seen.  No evidence of instability on flexion/extension views.     Patient name: Rhonda Higgins Patient MRN: [id number] Date of visit: 01/02/24

## 2024-01-05 ENCOUNTER — Ambulatory Visit
Admission: RE | Admit: 2024-01-05 | Discharge: 2024-01-05 | Disposition: A | Discharge status: Home / Self Care | Source: Ambulatory Visit | Attending: Orthopedic Surgery

## 2024-01-05 DIAGNOSIS — M5416 Radiculopathy, lumbar region: Secondary | ICD-10-CM

## 2024-01-22 ENCOUNTER — Encounter: Payer: Self-pay | Admitting: Podiatry

## 2024-01-22 ENCOUNTER — Ambulatory Visit (INDEPENDENT_AMBULATORY_CARE_PROVIDER_SITE_OTHER): Admitting: Podiatry

## 2024-01-22 DIAGNOSIS — Z91198 Patient's noncompliance with other medical treatment and regimen for other reason: Secondary | ICD-10-CM

## 2024-01-23 ENCOUNTER — Ambulatory Visit (INDEPENDENT_AMBULATORY_CARE_PROVIDER_SITE_OTHER): Admitting: Podiatry

## 2024-01-23 ENCOUNTER — Encounter: Payer: Self-pay | Admitting: Podiatry

## 2024-01-23 DIAGNOSIS — M79674 Pain in right toe(s): Secondary | ICD-10-CM

## 2024-01-23 DIAGNOSIS — M79675 Pain in left toe(s): Secondary | ICD-10-CM

## 2024-01-23 DIAGNOSIS — E1142 Type 2 diabetes mellitus with diabetic polyneuropathy: Secondary | ICD-10-CM | POA: Diagnosis not present

## 2024-01-23 DIAGNOSIS — B351 Tinea unguium: Secondary | ICD-10-CM | POA: Diagnosis not present

## 2024-01-23 NOTE — Progress Notes (Signed)
 1. Failure to attend appointment with reason given    Appt canceled by clinic. Power outage in office.

## 2024-01-27 ENCOUNTER — Encounter: Payer: Self-pay | Admitting: Podiatry

## 2024-01-27 NOTE — Progress Notes (Signed)
  Subjective:  Patient ID: Rhonda Higgins, female    DOB: 12/02/1970,  MRN: [id number]  Rhonda Higgins presents to clinic today for at risk foot care with history of diabetic neuropathy and painful thick toenails that are difficult to trim. Pain interferes with ambulation. Aggravating factors include wearing enclosed shoe gear. Pain is relieved with periodic professional debridement.  Chief Complaint  Patient presents with   Diabetes    DFC IDDM A1C 6.5. Toenail trim. LOV with PCP 11/2023.   New problem(s): None.   PCP is Joshua Francisco, MD.  Allergies  Allergen Reactions   Hydrocodone  Shortness Of Breath and Itching   Tape Rash    Paper tape is ok   Wound Dressing Adhesive Rash   Pholcodine Itching   Codeine Itching    Review of Systems: Negative except as noted in the HPI.  Objective: No changes noted in today's physical examination. There were no vitals filed for this visit. Rhonda Higgins is a pleasant 53 y.o. female in NAD. AAO x 3.  Vascular Examination: Capillary refill time immediate b/l. Palpable pedal pulses. Pedal hair present b/l. Pedal edema absent. No pain with calf compression b/l. Skin temperature gradient WNL b/l. No cyanosis or clubbing b/l. No ischemia or gangrene noted b/l.   Neurological Examination: Sensation grossly intact b/l with 10 gram monofilament. Vibratory sensation intact b/l. Pt has subjective symptoms of neuropathy.  Dermatological Examination: Pedal skin with normal turgor, texture and tone b/l.  No open wounds. No interdigital macerations.   Toenails 1-5 b/l thick, discolored, elongated with subungual debris and pain on dorsal palpation.   No hyperkeratotic nor porokeratotic lesions.  Musculoskeletal Examination: Muscle strength 5/5 to all lower extremity muscle groups bilaterally. No pain, crepitus or joint limitation noted with ROM bilateral LE. No gross bony deformities bilaterally.  Radiographs:  None  Assessment/Plan: 1. Pain due to onychomycosis of toenails of both feet   2. Diabetic peripheral neuropathy associated with type 2 diabetes mellitus (HCC)    -Patient was evaluated today. All questions/concerns addressed on today's visit. -Continue foot and shoe inspections daily. Monitor blood glucose per PCP/Endocrinologist's recommendations. -Patient to continue soft, supportive shoe gear daily. -Toenails 1-5 bilaterally were debrided in length and girth with sterile nail nippers and dremel. Pinpoint bleeding of R 2nd toe addressed with Lumicain Hemostatic Solution, cleansed with alcohol. Triple antibiotic ointment applied. Patient/careigver instructed to apply Neosporin Cream once daily for 7 days. -Patient/POA to call should there be question/concern in the interim.   Return in about 3 months (around 04/24/2024).  Rhonda Higgins, DPM      [facility] LOCATION: [address] [place] [address]                   Office [phone]   [facility] LOCATION: [address] [place], [place] [address] Office [phone]

## 2024-02-04 ENCOUNTER — Ambulatory Visit: Admitting: Orthopedic Surgery

## 2024-02-04 ENCOUNTER — Ambulatory Visit (INDEPENDENT_AMBULATORY_CARE_PROVIDER_SITE_OTHER): Payer: Self-pay

## 2024-02-04 DIAGNOSIS — Z981 Arthrodesis status: Secondary | ICD-10-CM

## 2024-02-04 NOTE — Progress Notes (Signed)
 Orthopedic Spine Surgery Office Note   Assessment: Patient is a 53 y.o. female with significant improvement in her low back and bilateral leg pain.  Doing well after her cervical spine surgery.     Plan: -Patient has tried Tylenol , Medrol  Dosepak, Flexeril , Norco, pain management -Continue with pain management -Since she is doing well, recommended no further intervention at this time -Patient should return to office on an as-needed basis     Patient expressed understanding of the plan and all questions were answered to the patient's satisfaction.    ___________________________________________________________________________   History: Patient is a 53 y.o. female who comes in today for follow up on her lumbar spine and for her 1 year follow-up on her cervical spine surgery.  Patient states that she has been doing well since she was last seen in the office.  She has been to pain management and is taking a pain pill twice per day.  She says that her pain is well-controlled with that regimen.  She has not noticed any neck pain or radiating arm pain.  Her back pain that radiated to her bilateral lateral thighs is improved as well.  She said right now she is able to do everything she wants and her pain is tolerable.  She has no complaints at today's visit.   Previous treatments: Tylenol , Medrol  Dosepak, Flexeril , Norco, pain management     Physical Exam:   General: no acute distress, appears stated age Neurologic: alert, answering questions appropriately, following commands Respiratory: unlabored breathing on room air, symmetric chest rise Psychiatric: appropriate affect, normal cadence to speech     MSK (spine):   -Strength exam                                                   Left                  Right EHL                              5/5                  5/5 TA                                 5/5                  5/5 GSC                             5/5                  5/5 Knee  extension            5/5                  5/5 Hip flexion                    5/5                  5/5   -Sensory exam  Sensation intact to light touch in L3-S1 nerve distributions of bilateral lower extremities   Imaging: XRs of the cervical spine from 02/04/2024 were independently reviewed and interpreted, showing anterior instrumentation from C5-7.  There are allograft interbody devices in the former disc spaces.  Interbody devices appear in appropriate position.  There is no lucency seen around the anterior instrumentation.  No significant adjacent segment disease seen.  No fracture or dislocation seen.     Patient name: Rhonda Higgins Patient MRN: [id number] Date of visit: 02/04/24

## 2024-02-06 ENCOUNTER — Ambulatory Visit: Admitting: Orthopedic Surgery

## 2024-02-14 ENCOUNTER — Ambulatory Visit: Admitting: Emergency Medicine

## 2024-02-29 ENCOUNTER — Ambulatory Visit
Admission: RE | Admit: 2024-02-29 | Discharge: 2024-02-29 | Disposition: A | Discharge status: Home / Self Care | Source: Ambulatory Visit | Attending: Family Medicine | Admitting: Family Medicine

## 2024-02-29 ENCOUNTER — Telehealth: Payer: Self-pay | Admitting: Surgical

## 2024-02-29 DIAGNOSIS — Z1231 Encounter for screening mammogram for malignant neoplasm of breast: Secondary | ICD-10-CM

## 2024-02-29 NOTE — Telephone Encounter (Signed)
 Error

## 2024-03-03 ENCOUNTER — Ambulatory Visit: Admitting: Surgical

## 2024-03-12 ENCOUNTER — Ambulatory Visit

## 2024-03-12 VITALS — BP 124/68 | HR 73 | Ht 63.0 in | Wt 222.6 lb

## 2024-03-12 DIAGNOSIS — R911 Solitary pulmonary nodule: Secondary | ICD-10-CM | POA: Diagnosis not present

## 2024-03-12 DIAGNOSIS — R0602 Shortness of breath: Secondary | ICD-10-CM

## 2024-03-12 DIAGNOSIS — R600 Localized edema: Secondary | ICD-10-CM

## 2024-03-12 DIAGNOSIS — D3A090 Benign carcinoid tumor of the bronchus and lung: Secondary | ICD-10-CM

## 2024-03-12 DIAGNOSIS — J9611 Chronic respiratory failure with hypoxia: Secondary | ICD-10-CM | POA: Diagnosis not present

## 2024-03-12 DIAGNOSIS — K7460 Unspecified cirrhosis of liver: Secondary | ICD-10-CM | POA: Diagnosis not present

## 2024-03-12 DIAGNOSIS — K767 Hepatorenal syndrome: Secondary | ICD-10-CM

## 2024-03-12 NOTE — Patient Instructions (Signed)
 Your PFT and walk test will be scheduled today You will receive a call for CT chest and ECHO scheduling Please call our clinic number [phone] if you don't get a scheduling call within 2 -3 weeks.

## 2024-03-12 NOTE — Progress Notes (Signed)
 Subjective:   PATIENT ID: Rhonda Higgins GENDER: female DOB: 12/09/1970, MRN: [id number]   HPI Rhonda Higgins is a 53 Y O F who is here for shortness of breath.  She was previously seen by Dr Rubinstein in this clinic. She was found to have a 1.2 left lower lobe perihilar rounded pulmonary nodule on MRI of the abdomen done 09/30/2020. Not seen on chest x-ray from 11/24/2020. PET scan performed 03/04/2021 and reviewed by me shows a 1.5 x 1.3 rounded left lower lobe pulmonary nodule. There is only low-grade metabolic activity (SUV 4.1). No mediastinal adenopathy or any evidence of distant disease. Slightly increased in size based on comparison with a CT abdomen 10/16/2018  She was last seen in this clinic by DR Byrum in 04/2021 following a bronchoscopic biopsy of 1.5 by 1.1 cm left lower lobe nodule. Following day, her biopsy resulted as neuroendocrine tumor, atypical carcinoid tumor and Dr Shelah called her with results. Pt hasn't followed up with pulmonary clinic since then. She is wanting to have a follow up of that nodule as well  Pt doesn't recall undergoing bronchoscopies and insists she would remember if she did undergo. She reports she has undergone EGD (for cirrhosis, vairces- Dr Saintclair) and thyroid  biopsies.   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   ECHO 07/2014 by Dr LEVERN is within normal limits. She has chronic pedal edema, she attributes it to oral vancomycin (c diff hx).  She also reports low oxygen saturation at pain clinic. Reports shortness of breath at rest   Has gained 3 pounds since discontinuing ozempic  Had anemia last year but recent hb has been within normal limits    She lives with her wife. 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   Past Medical History:  Diagnosis Date   Adenocarcinoma (HCC)    endometrial, FIGO GRADE 1   Allergic rhinitis    Arthritis    Atypical  chest pain    History of   Borderline personality disorder (HCC)    Cirrhosis (HCC)    Depression    Elevated liver enzymes    GERD (gastroesophageal reflux disease)    Hematuria    History of endometrial cancer 08-02-2009   oncologist-  dr brewster/ [place] and dr kinard/  no recurrence   endometrial adenocarinoma Stage 1B, Grade 1, FIGO--  s/p  TAH w/ BSO and pelvic lymph node dissection's and radiation therapy   History of kidney stones    History of radiation therapy    2011  pelvic intracavity brachytherapy treatment's for endometrial carcinoma   History of thyroid  nodule    multinodular goiter s/p  total thyroidectomy 11-19-2015  per pathology -  adenomatoid nodules   Hyperlipidemia    Hypothyroidism, postsurgical    Insulin  dependent diabetes mellitus    Type 2   Left ureteral stone    Obesity    OSA (obstructive sleep apnea)    sleep study 2017 showed OSA negative (pt had OSA in 2011, and lost weight, no issues since)   Overactive bladder    Personality disorder (HCC)    Polyphagia(783.6)    PONV (postoperative nausea and vomiting)    after ear surgery only one time   Right lower quadrant pain    Umbilical hernia    Urgency of urination    UTI (urinary tract infection)      Family History  Problem Relation  Age of Onset   Asthma Mother    Heart disease Mother    Diabetes Mother    Emphysema Mother    Hypertension Mother    Stroke Mother    Prostate cancer Father    Heart disease Sister    COPD Sister        smoker   Heart disease Sister    Asthma Sister    Diabetes Sister    Breast cancer Sister 17   COPD Sister        smoker   Heart attack Brother    Heart disease Brother      Social History   Socioeconomic History   Marital status: Married    Spouse name: Not on file   Number of children: 0   Years of education: 10   Highest education level: 10th grade  Occupational History   Occupation: n/a  Tobacco Use   Smoking status: Never    Passive  exposure: Past   Smokeless tobacco: Never  Vaping Use   Vaping status: Never Used  Substance and Sexual Activity   Alcohol use: No   Drug use: No   Sexual activity: Yes    Birth control/protection: None  Other Topics Concern   Not on file  Social History Narrative   Lives with partner, Levon Rubinstein.   Social Drivers of Corporate investment banker Strain: Low Risk  (08/20/2022)   Received from [facility]   Overall Financial Resource Strain (CARDIA)    Difficulty of Paying Living Expenses: Not hard at all  Food Insecurity: Low Risk  (01/04/2023)   Received from [facility]   Hunger Vital Sign    Within the past 12 months, you worried that your food would run out before you got money to buy more: Never true    Within the past 12 months, the food you bought just didn't last and you didn't have money to get more. : Never true  Transportation Needs: No Transportation Needs (08/20/2022)   Received from [facility] - Transportation    Lack of Transportation (Medical): No    Lack of Transportation (Non-Medical): No  Physical Activity: Unknown (08/20/2022)   Received from [facility]   Exercise Vital Sign    On average, how many days per week do you engage in moderate to strenuous exercise (like a brisk walk)?: 0 days    Minutes of Exercise per Session: Not on file  Recent Concern: Physical Activity - Inactive (08/20/2022)   Received from [facility]   Exercise Vital Sign    Days of Exercise per Week: 0 days    Minutes of Exercise per Session: 0 min  Stress: Stress Concern Present (08/20/2022)   Received from [facility] - Occupational Stress Questionnaire    Feeling of Stress : Very much  Social Connections: Socially Integrated (08/20/2022)   Received from [facility]   Social Network    How would you rate your social network (family, work, friends)?: Good participation with social networks  Intimate Partner Violence: Not At  Risk (08/20/2022)   Received from [facility]   HITS    Over the last 12 months how often did your partner physically hurt you?: Never    Over the last 12 months how often did your partner insult you or talk down to you?: Never    Over the last 12 months how often did your partner threaten you with physical  harm?: Never    Over the last 12 months how often did your partner scream or curse at you?: Never     Allergies  Allergen Reactions   Tape Rash    Paper tape is ok   Wound Dressing Adhesive Rash   Pholcodine Itching   Semaglutide Nausea And Vomiting   Codeine Itching     Outpatient Medications Prior to Visit  Medication Sig Dispense Refill   ACCU-CHEK GUIDE test strip      Accu-Chek Softclix Lancets lancets daily.     atorvastatin  (LIPITOR) 40 MG tablet Take 40 mg by mouth daily.     dicyclomine (BENTYL) 20 MG tablet Take 20 mg by mouth 4 (four) times daily as needed for spasms.     gabapentin  (NEURONTIN ) 400 MG capsule Take 400-1,200 mg by mouth See admin instructions. Takes 400 mg in the morning 400 mg in the afternoon and 1200 mg at night     HYDROcodone -acetaminophen  (NORCO/VICODIN) 5-325 MG tablet Take 1 tablet by mouth every 12 (twelve) hours.     hydrOXYzine  (ATARAX ) 25 MG tablet Take 25 mg by mouth 3 (three) times daily as needed for anxiety.     insulin  glargine (LANTUS ) 100 UNIT/ML injection Inject 18 Units into the skin at bedtime.     lisinopril  (PRINIVIL ,ZESTRIL ) 5 MG tablet Take 5 mg by mouth daily.     metFORMIN  (GLUCOPHAGE -XR) 500 MG 24 hr tablet Take 500 mg by mouth 2 (two) times daily.     metoCLOPramide  (REGLAN ) 10 MG tablet Take 1 tablet (10 mg total) by mouth every 6 (six) hours. 30 tablet 0   ondansetron  (ZOFRAN -ODT) 4 MG disintegrating tablet Take 4 mg by mouth every 6 (six) hours as needed for vomiting or nausea.     pantoprazole  (PROTONIX ) 40 MG tablet Take 40 mg by mouth 2 (two) times daily.   0   risperiDONE  (RISPERDAL ) 2 MG tablet Take 2 mg by mouth at  bedtime.   2   sucralfate  (CARAFATE ) 1 g tablet Take 1 tablet (1 g total) by mouth 4 (four) times daily -  with meals and at bedtime. 120 tablet 0   SYNTHROID  175 MCG tablet Take 175 mcg by mouth daily.     vancomycin (VANCOCIN) 250 MG capsule Take 250 mg by mouth 4 (four) times daily.     Vitamin D, Ergocalciferol, (DRISDOL) 1.25 MG (50000 UNIT) CAPS capsule Take 50,000 Units by mouth once a week.     ACCRUFER 30 MG CAPS Take 30 mg by mouth once a week.     buPROPion  (WELLBUTRIN  XL) 300 MG 24 hr tablet Take 300 mg by mouth daily.     cetirizine (ZYRTEC) 10 MG tablet Take 10 mg by mouth daily as needed for allergies.     cyclobenzaprine  (FLEXERIL ) 10 MG tablet Take 1 tablet (10 mg total) by mouth 3 (three) times daily as needed (pain, muscle spasms). 50 tablet 0   lidocaine  (XYLOCAINE ) 2 % solution Use as directed 10 mLs in the mouth or throat every 4 (four) hours as needed for mouth pain. 100 mL 0   OZEMPIC, 1 MG/DOSE, 4 MG/3ML SOPN Inject 1 mg into the skin every Monday.     tamsulosin  (FLOMAX ) 0.4 MG CAPS capsule Take 1 capsule (0.4 mg total) by mouth daily. 15 capsule 1   traZODone  (DESYREL ) 50 MG tablet Take 1 tablet (50 mg total) by mouth at bedtime. (Patient taking differently: Take 50 mg by mouth at bedtime as needed for sleep.)  No facility-administered medications prior to visit.    ROS Reviewed all systems and reported negative except as above     Objective:   Vitals:   03/12/24 1359  BP: 124/68  Pulse: 73  SpO2: 97%  Weight: 222 lb 9.6 oz (101 kg)  Height: 5' 3 (1.6 m)    Physical Exam Constitutional:      General: She is not in acute distress.    Appearance: Normal appearance.  HENT:     Mouth/Throat:     Mouth: Mucous membranes are moist.     Pharynx: Oropharynx is clear.  Cardiovascular:     Rate and Rhythm: Normal rate.  Pulmonary:     Effort: Pulmonary effort is normal.     Breath sounds: Normal breath sounds.  Abdominal:     General: There is  distension.  Musculoskeletal:     Right lower leg: Edema present.     Left lower leg: Edema present.  Skin:    General: Skin is warm.  Neurological:     Mental Status: She is oriented to person, place, and time.  Psychiatric:        Mood and Affect: Mood normal.        Behavior: Behavior normal.        CBC    Component Value Date/Time   WBC 3.0 (L) 11/19/2023 0945   WBC 3.1 (L) 09/17/2023 1332   RBC 4.65 11/19/2023 0945   HGB 12.1 11/19/2023 0945   HCT 38.2 11/19/2023 0945   PLT 67 (L) 11/19/2023 0945   MCV 82.2 11/19/2023 0945   MCH 26.0 11/19/2023 0945   MCHC 31.7 11/19/2023 0945   RDW 14.6 11/19/2023 0945   LYMPHSABS 0.6 (L) 11/19/2023 0945   MONOABS 0.2 11/19/2023 0945   EOSABS 0.1 11/19/2023 0945   BASOSABS 0.0 11/19/2023 0945   Reviewed prior PET and CT  Reviewed prior bronchoscopy notes from 03/2021 and prior ECHO Reviewed prior cytology notes Reviewed prior pulmonary notes by Dr Shelah and telephone encounters  CT chest 03/2021 Lungs/Pleura: Central airways are patent. Bilateral lower lung linear opacities, likely due to atelectasis. No consolidation, pleural effusion or pneumothorax. Nodule of the left lower lobe located adjacent to the hilum measuring 1.5 x 1.3 cm, unchanged in size.  Chest imaging: 03/2021 chest xray Mild atelectatic changes in the mid and lower lungs. No pneumothorax post procedure  PFT:     No data to display          Labs:    Echo:       Assessment & Plan:   Assessment & Plan Shortness of breath Extensive review of chart performed before and after in person evaluation Likely multifactorial in etiology BMI 39, likely some degree of diastolic CHF, restrictive lung physiology from weight, deconditioning, hepatopulmonary syndrome, pulmonary HTN, ? asthma. Doesn't have anemia at this time Will schedule CT chest, ECHO with bubble study Scheduled full PFT with BD Will schedule 6 MWT  Orders:   6 minute walk;  Future   CT Chest Wo Contrast; Future   ECHOCARDIOGRAM COMPLETE BUBBLE STUDY; Future   CT Super D Chest W Wo Contrast; Future   Pulmonary function test; Future   6 minute walk; Future  Hepatic cirrhosis, unspecified hepatic cirrhosis type, unspecified whether ascites present (HCC) Follows with GI closely Evaluating for HPS, pulm HTN as above Not anemic currently Orders:   ECHOCARDIOGRAM COMPLETE BUBBLE STUDY; Future   CT Super D Chest W Wo Contrast; Future   Pulmonary function  test; Future   6 minute walk; Future  Nodule of lower lobe of left lung Needs further evaluation 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 Will schedule follow up CT chest now Will consider oncology referral Initially pt couldn't remember undergoing bronchoscopy, however bronch reports, path reports discussed/reviewed with pt and she was appreciative Orders:   ECHOCARDIOGRAM COMPLETE BUBBLE STUDY; Future   CT Super D Chest W Wo Contrast; Future   Pulmonary function test; Future   6 minute walk; Future  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 Will consider oncology referral once we have updated scans/ work up Orders:   ECHOCARDIOGRAM COMPLETE BUBBLE STUDY; Future   CT Super D Chest W Wo Contrast; Future   Pulmonary function test; Future   6 minute walk; Future  Pedal edema ECHO to r/o HFpEF ECHO with bubble studies to r/o shunts Orders:   ECHOCARDIOGRAM COMPLETE BUBBLE STUDY; Future   CT Super D Chest W Wo Contrast; Future   Pulmonary function test; Future   6 minute walk; Future  Chronic respiratory failure with hypoxia (HCC) Schedule Orders:   ECHOCARDIOGRAM COMPLETE BUBBLE STUDY; Future   CT Super D Chest W Wo Contrast; Future   Pulmonary function test; Future   6 minute walk; Future  Hepatorenal syndrome (HCC)  Orders:   ECHOCARDIOGRAM COMPLETE BUBBLE STUDY; Future    Thank you for the opportunity to take part in the care of Rhonda Higgins    Kunio Cummiskey Pleas,  MD [facility] Office: [phone]

## 2024-03-12 NOTE — Assessment & Plan Note (Signed)
 Follows with GI closely Evaluating for HPS, pulm HTN as above Not anemic currently Orders:   ECHOCARDIOGRAM COMPLETE BUBBLE STUDY; Future   CT Super D Chest W Wo Contrast; Future   Pulmonary function test; Future   6 minute walk; Future

## 2024-03-13 ENCOUNTER — Ambulatory Visit
Admission: RE | Admit: 2024-03-13 | Discharge: 2024-03-13 | Disposition: A | Discharge status: Home / Self Care | Source: Ambulatory Visit

## 2024-03-13 DIAGNOSIS — R0602 Shortness of breath: Secondary | ICD-10-CM

## 2024-03-19 ENCOUNTER — Ambulatory Visit: Payer: Self-pay

## 2024-03-19 DIAGNOSIS — D3A090 Benign carcinoid tumor of the bronchus and lung: Secondary | ICD-10-CM

## 2024-03-19 NOTE — Progress Notes (Signed)
 Patient called.  I spoke to the pt over the phone and made her aware of the CT results. CT chest shows increase in size of the left lung nodule- from 1.3 to 1.6 in current CT chest. This is a biopsy proven carcinoid. May need resection. Pt hasent seen oncology before. Would like her to see oncology for follow up and management of this. Referred to hem/oncology.

## 2024-03-20 ENCOUNTER — Inpatient Hospital Stay

## 2024-03-20 ENCOUNTER — Inpatient Hospital Stay: Attending: Internal Medicine | Admitting: Internal Medicine

## 2024-03-20 ENCOUNTER — Other Ambulatory Visit: Payer: Self-pay

## 2024-03-20 VITALS — BP 138/82 | HR 52 | Temp 97.2°F | Resp 17 | Ht 63.0 in | Wt 215.2 lb

## 2024-03-20 DIAGNOSIS — R911 Solitary pulmonary nodule: Secondary | ICD-10-CM

## 2024-03-20 DIAGNOSIS — D72819 Decreased white blood cell count, unspecified: Secondary | ICD-10-CM | POA: Diagnosis not present

## 2024-03-20 DIAGNOSIS — D693 Immune thrombocytopenic purpura: Secondary | ICD-10-CM | POA: Insufficient documentation

## 2024-03-20 DIAGNOSIS — C7A8 Other malignant neuroendocrine tumors: Secondary | ICD-10-CM

## 2024-03-20 DIAGNOSIS — C7A090 Malignant carcinoid tumor of the bronchus and lung: Secondary | ICD-10-CM | POA: Insufficient documentation

## 2024-03-20 LAB — CBC WITH DIFFERENTIAL (CANCER CENTER ONLY)
Abs Immature Granulocytes: 0.01 K/uL (ref 0.00–0.07)
Basophils Absolute: 0 K/uL (ref 0.0–0.1)
Basophils Relative: 0 %
Eosinophils Absolute: 0 K/uL (ref 0.0–0.5)
Eosinophils Relative: 2 %
HCT: 32.7 % — ABNORMAL LOW (ref 36.0–46.0)
Hemoglobin: 10.5 g/dL — ABNORMAL LOW (ref 12.0–15.0)
Immature Granulocytes: 0 %
Lymphocytes Relative: 25 %
Lymphs Abs: 0.7 K/uL (ref 0.7–4.0)
MCH: 26.1 pg (ref 26.0–34.0)
MCHC: 32.1 g/dL (ref 30.0–36.0)
MCV: 81.3 fL (ref 80.0–100.0)
Monocytes Absolute: 0.2 K/uL (ref 0.1–1.0)
Monocytes Relative: 8 %
Neutro Abs: 1.7 K/uL (ref 1.7–7.7)
Neutrophils Relative %: 65 %
Platelet Count: 73 K/uL — ABNORMAL LOW (ref 150–400)
RBC: 4.02 MIL/uL (ref 3.87–5.11)
RDW: 15.6 % — ABNORMAL HIGH (ref 11.5–15.5)
WBC Count: 2.6 K/uL — ABNORMAL LOW (ref 4.0–10.5)
nRBC: 0 % (ref 0.0–0.2)

## 2024-03-20 LAB — CMP (CANCER CENTER ONLY)
ALT: 15 U/L (ref 0–44)
AST: 24 U/L (ref 15–41)
Albumin: 3.9 g/dL (ref 3.5–5.0)
Alkaline Phosphatase: 64 U/L (ref 38–126)
Anion gap: 5 (ref 5–15)
BUN: 7 mg/dL (ref 6–20)
CO2: 29 mmol/L (ref 22–32)
Calcium: 8.2 mg/dL — ABNORMAL LOW (ref 8.9–10.3)
Chloride: 107 mmol/L (ref 98–111)
Creatinine: 0.9 mg/dL (ref 0.44–1.00)
GFR, Estimated: >60 mL/min (ref >60)
Glucose, Bld: 116 mg/dL — ABNORMAL HIGH (ref 70–99)
Potassium: 3.5 mmol/L (ref 3.5–5.1)
Sodium: 141 mmol/L (ref 135–145)
Total Bilirubin: 0.8 mg/dL (ref 0.0–1.2)
Total Protein: 6.8 g/dL (ref 6.5–8.1)

## 2024-03-20 NOTE — Progress Notes (Signed)
 [facility] [phone]   [phone]  CONSULT NOTE  REFERRING PHYSICIAN: Dr. 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 female.   HPI  Discussed the use of AI scribe software for clinical note transcription with the patient, who gave verbal consent to proceed.  History of Present Illness Rhonda Higgins is a 53 year old female with a neuroendocrine tumor who presents for evaluation of the tumor. She is accompanied by her wife, Rhonda Higgins.  In 2022, a nodule was discovered in the left lower lobe of her lung during imaging studies for breathing problems. Initially, no information about cancer was communicated. A PET scan on March 04, 2021, showed a slowly growing nodule with some activity. A bronchoscopy on April 18, 2021, was performed and the biopsy result was reported as a neuroendocrine tumor, specifically an atypical carcinoid. The nodule was monitored, and a recent scan on March 13, 2024, showed an increase in size from 1.3 to 1.6 cm over three years.  She experiences difficulty taking deep breaths, stating she 'can't get a whole deep breath.' No chest pain, hemoptysis, or colored sputum. She reports nausea attributed to medication but denies vomiting, diarrhea, night sweats, or unintentional weight loss.  Her past medical history includes endometrial carcinoma, allergic rhinitis, borderline personality disorder, liver cirrhosis, depression, acid reflux, kidney stones, high cholesterol, sleep apnea (improved with weight loss), overactive bladder, anemia, and diabetes type 2. She has received iron  infusions for anemia but not for low platelets.  Family history reveals her father had lung cancer, her mother had heart problems and COPD, and her brother died from a massive heart attack. All three sisters have had triple bypass surgeries, indicating  a family history of heart disease.  She has never smoked, is allergic to smoke, and does not consume alcohol or use drugs. She is allergic to codeine, folicudine, and semaglutide.     Past Medical History:  Diagnosis Date   Adenocarcinoma (HCC)    endometrial, FIGO GRADE 1   Allergic rhinitis    Arthritis    Atypical chest pain    History of   Borderline personality disorder (HCC)    Cirrhosis (HCC)    Depression    Elevated liver enzymes    GERD (gastroesophageal reflux disease)    Hematuria    History of endometrial cancer 08-02-2009   oncologist-  dr brewster/ eloy and dr kinard/  no recurrence   endometrial adenocarinoma Stage 1B, Grade 1, FIGO--  s/p  TAH w/ BSO and pelvic lymph node dissection's and radiation therapy   History of kidney stones    History of radiation therapy    2011  pelvic intracavity brachytherapy treatment's for endometrial carcinoma   History of thyroid  nodule    multinodular goiter s/p  total thyroidectomy 11-19-2015  per pathology -  adenomatoid nodules   Hyperlipidemia    Hypothyroidism, postsurgical    Insulin  dependent diabetes mellitus    Type 2   Left ureteral stone    Obesity    OSA (obstructive sleep apnea)    sleep study 2017 showed OSA negative (pt had OSA in 2011, and lost weight, no issues since)   Overactive bladder    Personality disorder (HCC)    Polyphagia(783.6)    PONV (postoperative nausea and vomiting)    after ear surgery only one time   Right lower quadrant pain  Umbilical hernia    Urgency of urination    UTI (urinary tract infection)       Past Surgical History:  Procedure Laterality Date   ANTERIOR CERVICAL DECOMP/DISCECTOMY FUSION N/A 01/26/2023   Procedure: C5-6, C6-7 ANTERIOR CERVICAL DECOMPRESSION/DISCECTOMY FUSION 2 LEVELS;  Surgeon: Georgina Ozell LABOR, MD;  Location: [facility];  Service: Orthopedics;  Laterality: N/A;   BIOPSY  04/25/2022   Procedure: BIOPSY;  Surgeon: Saintclair Jasper, MD;  Location: [facility];   Service: Gastroenterology;;   BIOPSY  04/10/2023   Procedure: BIOPSY;  Surgeon: Saintclair Jasper, MD;  Location: [facility];  Service: Gastroenterology;;   BRONCHIAL BIOPSY  04/18/2021   Procedure: BRONCHIAL BIOPSIES;  Surgeon: Shelah Lamar RAMAN, MD;  Location: [facility] ENDOSCOPY;  Service: Pulmonary;;   BRONCHIAL BRUSHINGS  04/18/2021   Procedure: BRONCHIAL BRUSHINGS;  Surgeon: Shelah Lamar RAMAN, MD;  Location: [facility] ENDOSCOPY;  Service: Pulmonary;;   BRONCHIAL NEEDLE ASPIRATION BIOPSY  04/18/2021   Procedure: BRONCHIAL NEEDLE ASPIRATION BIOPSIES;  Surgeon: Shelah Lamar RAMAN, MD;  Location: [facility];  Service: Pulmonary;;   CARDIOVASCULAR STRESS TEST  06/09/2008   normal nuclear study w/ no ischemia/  normal LV function and wall motion , ef 83%   CHOLECYSTECTOMY N/A 09/06/2017   Procedure: LAPAROSCOPIC CHOLECYSTECTOMY WITH INTRAOPERATIVE CHOLANGIOGRAM;  Surgeon: Eletha Boas, MD;  Location: [facility];  Service: General;  Laterality: N/A;   COLONOSCOPY WITH PROPOFOL  N/A 04/25/2022   Procedure: COLONOSCOPY WITH PROPOFOL ;  Surgeon: Saintclair Jasper, MD;  Location: [facility];  Service: Gastroenterology;  Laterality: N/A;   CYSTOSCOPY/RETROGRADE/URETEROSCOPY/STONE EXTRACTION WITH BASKET Left 03/08/2016   Procedure: CYSTOSCOPY/RETROGRADE/URETEROSCOPY/STONE EXTRACTION WITH BASKET, STENT PLACEMENT;  Surgeon: Alm Fragmin, MD;  Location: [facility];  Service: Urology;  Laterality: Left;   CYSTOSCOPY/URETEROSCOPY/HOLMIUM LASER/STENT PLACEMENT Left 07/27/2023   Procedure: CYSTOSCOPYLEFT/URETEROSCOPY/HOLMIUM LASER/STENT PLACEMENT;  Surgeon: Carolee Sherwood JONETTA DOUGLAS, MD;  Location: [facility];  Service: Urology;  Laterality: Left;   ENDOMETRIAL BIOPSY     ERCP N/A 09/07/2017   Procedure: ENDOSCOPIC RETROGRADE CHOLANGIOPANCREATOGRAPHY (ERCP);  Surgeon: Saintclair Jasper, MD;  Location: [facility];  Service: Gastroenterology;  Laterality: N/A;   ESOPHAGEAL BANDING  04/10/2023   Procedure: ESOPHAGEAL BANDING;  Surgeon: Saintclair Jasper, MD;  Location: [facility];  Service: Gastroenterology;;   ESOPHAGOGASTRODUODENOSCOPY N/A 04/25/2022   Procedure: ESOPHAGOGASTRODUODENOSCOPY (EGD);  Surgeon: Saintclair Jasper, MD;  Location: [facility];  Service: Gastroenterology;  Laterality: N/A;   ESOPHAGOGASTRODUODENOSCOPY N/A 04/10/2023   Procedure: ESOPHAGOGASTRODUODENOSCOPY (EGD);  Surgeon: Saintclair Jasper, MD;  Location: [facility];  Service: Gastroenterology;  Laterality: N/A;  with possible banding for esophageal varices   ESOPHAGOGASTRODUODENOSCOPY N/A 11/20/2023   Procedure: EGD (ESOPHAGOGASTRODUODENOSCOPY);  Surgeon: Saintclair Jasper, MD;  Location: [facility];  Service: Gastroenterology;  Laterality: N/A;   ESOPHAGOGASTRODUODENOSCOPY (EGD) WITH PROPOFOL  N/A 03/16/2020   Procedure: ESOPHAGOGASTRODUODENOSCOPY (EGD) WITH PROPOFOL ;  Surgeon: Saintclair Jasper, MD;  Location: [facility];  Service: Gastroenterology;  Laterality: N/A;  unable to locate ampulla, aborted ERCP, changed to EGD   HEMOSTASIS CLIP PLACEMENT  04/25/2022   Procedure: HEMOSTASIS CLIP PLACEMENT;  Surgeon: Saintclair Jasper, MD;  Location: [facility];  Service: Gastroenterology;;   HEMOSTASIS CONTROL  04/25/2022   Procedure: HEMOSTASIS CONTROL;  Surgeon: Saintclair Jasper, MD;  Location: [facility];  Service: Gastroenterology;;   HOLMIUM LASER APPLICATION Left 03/08/2016   Procedure: HOLMIUM LASER APPLICATION;  Surgeon: Alm Fragmin, MD;  Location: [facility];  Service: Urology;  Laterality: Left;   MOUTH SURGERY     MYRINGECOTMY W/ REMOVAL MIDDLE EAR CHOLESTEATOMA TYPE 1  FASICA TYMPANOPLASTY  09/13/2000   POLYPECTOMY  04/25/2022   Procedure: POLYPECTOMY;  Surgeon: Saintclair Jasper, MD;  Location: [facility];  Service: Gastroenterology;;   ROBOTIC ASSISTED TOTAL HYSTERECTOMY WITH BILATERAL SALPINGO OOPHERECTOMY  08-02-2009   at [facility]  dr eloy   w/  Bilateral pelvic and para aortic lymph node dissection's   THYROIDECTOMY N/A 11/19/2015   Procedure: TOTAL THYROIDECTOMY;   Surgeon: Krystal Spinner, MD;  Location: [facility];  Service: General;  Laterality: N/A;   TONSILLECTOMY  age 61   TRANSTHORACIC ECHOCARDIOGRAM  07/19/2014   ef 55-60%/  trivial TR   TYMPANOPLASTY Right 1993   VIDEO BRONCHOSCOPY WITH ENDOBRONCHIAL NAVIGATION Left 04/18/2021   Procedure: ROBOTIC ASSISTED BRONCHOSCOPY WITH ENDOBRONCHIAL NAVIGATION;  Surgeon: Shelah Lamar RAMAN, MD;  Location: [facility];  Service: Pulmonary;  Laterality: Left;   VIDEO BRONCHOSCOPY WITH RADIAL ENDOBRONCHIAL ULTRASOUND  04/18/2021   Procedure: RADIAL ENDOBRONCHIAL ULTRASOUND;  Surgeon: Shelah Lamar RAMAN, MD;  Location: [facility];  Service: Pulmonary;;    Family History  Problem Relation Age of Onset   Asthma Mother    Heart disease Mother    Diabetes Mother    Emphysema Mother    Hypertension Mother    Stroke Mother    Prostate cancer Father    Heart disease Sister    COPD Sister        smoker   Heart disease Sister    Asthma Sister    Diabetes Sister    Breast cancer Sister 40   COPD Sister        smoker   Heart attack Brother    Heart disease Brother     Social History Social History   Tobacco Use   Smoking status: Never    Passive exposure: Past   Smokeless tobacco: Never  Vaping Use   Vaping status: Never Used  Substance Use Topics   Alcohol use: No   Drug use: No    Allergies  Allergen Reactions   Tape Rash    Paper tape is ok   Wound Dressing Adhesive Rash   Pholcodine Itching   Semaglutide Nausea And Vomiting   Codeine Itching    Current Outpatient Medications  Medication Sig Dispense Refill   ACCU-CHEK GUIDE test strip      Accu-Chek Softclix Lancets lancets daily.     atorvastatin  (LIPITOR) 40 MG tablet Take 40 mg by mouth daily.     dicyclomine (BENTYL) 20 MG tablet Take 20 mg by mouth 4 (four) times daily as needed for spasms.     gabapentin  (NEURONTIN ) 400 MG capsule Take 400-1,200 mg by mouth See admin instructions. Takes 400 mg in the morning 400 mg in the afternoon  and 1200 mg at night     HYDROcodone -acetaminophen  (NORCO/VICODIN) 5-325 MG tablet Take 1 tablet by mouth every 12 (twelve) hours.     hydrOXYzine  (ATARAX ) 25 MG tablet Take 25 mg by mouth 3 (three) times daily as needed for anxiety.     insulin  glargine (LANTUS ) 100 UNIT/ML injection Inject 18 Units into the skin at bedtime.     lisinopril  (PRINIVIL ,ZESTRIL ) 5 MG tablet Take 5 mg by mouth daily.     metFORMIN  (GLUCOPHAGE -XR) 500 MG 24 hr tablet Take 500 mg by mouth 2 (two) times daily.     metoCLOPramide  (REGLAN ) 10 MG tablet Take 1 tablet (10 mg total) by mouth every 6 (six) hours. 30 tablet 0   ondansetron  (ZOFRAN -ODT) 4 MG disintegrating tablet Take 4 mg by mouth every 6 (six)  hours as needed for vomiting or nausea.     pantoprazole  (PROTONIX ) 40 MG tablet Take 40 mg by mouth 2 (two) times daily.   0   risperiDONE  (RISPERDAL ) 2 MG tablet Take 2 mg by mouth at bedtime.   2   sucralfate  (CARAFATE ) 1 g tablet Take 1 tablet (1 g total) by mouth 4 (four) times daily -  with meals and at bedtime. 120 tablet 0   SYNTHROID  175 MCG tablet Take 175 mcg by mouth daily.     vancomycin (VANCOCIN) 250 MG capsule Take 250 mg by mouth 4 (four) times daily.     Vitamin D, Ergocalciferol, (DRISDOL) 1.25 MG (50000 UNIT) CAPS capsule Take 50,000 Units by mouth once a week.     No current facility-administered medications for this visit.    Review of Systems  Constitutional: positive for fatigue Eyes: negative Ears, nose, mouth, throat, and face: negative Respiratory: positive for dyspnea on exertion Cardiovascular: negative Gastrointestinal: negative Genitourinary:negative Integument/breast: negative Hematologic/lymphatic: negative Musculoskeletal:positive for arthralgias Neurological: negative Behavioral/Psych: negative Endocrine: negative Allergic/Immunologic: negative  Physical Exam  MJO:jozmu, healthy, no distress, well nourished, well developed, and obese SKIN: skin color, texture, turgor  are normal, no rashes or significant lesions HEAD: Normocephalic, No masses, lesions, tenderness or abnormalities EYES: normal, PERRLA, Conjunctiva are pink and non-injected EARS: External ears normal, Canals clear OROPHARYNX:no exudate, no erythema, and lips, buccal mucosa, and tongue normal  NECK: supple, no adenopathy, no JVD LYMPH:  no palpable lymphadenopathy, no hepatosplenomegaly BREAST:not examined LUNGS: clear to auscultation , and palpation HEART: regular rate & rhythm, no murmurs, and no gallops ABDOMEN:abdomen soft, non-tender, obese, normal bowel sounds, and no masses or organomegaly BACK: Back symmetric, no curvature., No CVA tenderness EXTREMITIES:no joint deformities, effusion, or inflammation, no edema  NEURO: alert & oriented x 3 with fluent speech, no focal motor/sensory deficits  PERFORMANCE STATUS: ECOG 1  LABORATORY DATA: Lab Results  Component Value Date   WBC 3.0 (L) 11/19/2023   HGB 12.1 11/19/2023   HCT 38.2 11/19/2023   MCV 82.2 11/19/2023   PLT 67 (L) 11/19/2023      Chemistry      Component Value Date/Time   NA 140 09/17/2023 1332   K 3.6 09/17/2023 1332   CL 103 09/17/2023 1332   CO2 26 09/17/2023 1332   BUN 8 09/17/2023 1332   CREATININE 0.68 09/17/2023 1332      Component Value Date/Time   CALCIUM  8.7 (L) 09/17/2023 1332   ALKPHOS 71 09/17/2023 1332   AST 31 09/17/2023 1332   ALT 23 09/17/2023 1332   BILITOT 0.8 09/17/2023 1332       RADIOGRAPHIC STUDIES: CT Chest Wo Contrast Result Date: 03/19/2024 CLINICAL DATA:  Pulmonary nodule follow-up EXAM: CT CHEST WITHOUT CONTRAST TECHNIQUE: Multidetector CT imaging of the chest was performed following the standard protocol without IV contrast. RADIATION DOSE REDUCTION: This exam was performed according to the departmental dose-optimization program which includes automated exposure control, adjustment of the mA and/or kV according to patient size and/or use of iterative reconstruction  technique. COMPARISON:  04/12/2021 FINDINGS: Cardiovascular: No significant vascular findings. Mild aortic and coronary artery calcification. Normal heart size. No pericardial effusion. Mediastinum/Nodes: No enlarged mediastinal or axillary lymph nodes. Thyroid  gland, trachea, and esophagus demonstrate no significant findings. Lungs/Pleura: Continued slow interval growth of a solid pulmonary nodule in the perihilar aspect of the left lower lobe measuring 16 x 16 mm (series 8, image 82), previously measured 13 x 13 mm on 04/12/2021. Scattered  small calcified granulomas noted bilaterally. Lungs are otherwise clear. No airspace consolidation, pleural effusion, or pneumothorax. Upper Abdomen: Nodular hepatic surface contour. Splenomegaly. Upper abdominal and paraesophageal varices. Musculoskeletal: No chest wall mass or suspicious bone lesions identified. IMPRESSION: 1. Continued slow interval growth of a solid pulmonary nodule in the perihilar aspect of the left lower lobe measuring 16 x 16 mm, previously measured 13 x 13 mm on 04/12/2021. Findings are concerning for a slow-growing neoplasm. Tissue sampling recommended if not previously performed. 2. Cirrhosis with sequela of portal hypertension. 3. Aortic and coronary artery atherosclerosis (ICD10-I70.0). Electronically Signed   By: Mabel Converse D.O.   On: 03/19/2024 10:23   MM 3D SCREENING MAMMOGRAM BILATERAL BREAST Result Date: 03/04/2024 CLINICAL DATA:  Screening. EXAM: DIGITAL SCREENING BILATERAL MAMMOGRAM WITH TOMOSYNTHESIS AND CAD TECHNIQUE: Bilateral screening digital craniocaudal and mediolateral oblique mammograms were obtained. Bilateral screening digital breast tomosynthesis was performed. The images were evaluated with computer-aided detection. COMPARISON:  Previous exam(s). ACR Breast Density Category b: There are scattered areas of fibroglandular density. FINDINGS: There are no findings suspicious for malignancy. IMPRESSION: No mammographic  evidence of malignancy. A result letter of this screening mammogram will be mailed directly to the patient. RECOMMENDATION: Screening mammogram in one year. (Code:SM-B-01Y) BI-RADS CATEGORY  1: Negative. Electronically Signed   By: Debby Satterfield M.D.   On: 03/04/2024 13:05    ASSESSMENT: This is a very pleasant 53 years old white female never smoker with a stage Ia (T1b, N0, M0) neuroendocrine carcinoma, atypical carcinoid that was initially diagnosed in October 2022 involving left lower lobe lung nodule that has been slowly increasing in size over the last 3 years.    PLAN: I had a lengthy discussion with the patient and her wife today about her current condition and treatment options.  I personally independently reviewed the imaging studies and discussed the result with the patient today.  In addition to the close monitoring and observation I discussed with the patient treatment with surgical resection or SBRT if she is not a good surgical candidate. 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 It is a moderate-grade tumor, not the slowest growing but not aggressive. She has no smoking history, which is favorable for surgical intervention. - Preferred treatment is surgical resection for curative outcome. - Order appointment with a thoracic surgeon for surgical evaluation. - Order pulmonary function test to assess lung capacity for surgery. - Schedule follow-up appointment one month post-surgery to review pathology results and further management. - Surgery expected to be performed robotically, allowing for a shorter hospital stay of 2-3 days. - Alternative to surgery is radiation therapy, but surgery is preferred for its curative potential without the need for chemotherapy, targeted therapy,  or immunotherapy.  For the leukocytopenia and ITP, we will continue to monitor her closely. The patient was advised to call immediately if she has any other concerning symptoms in the interval.  The patient voices understanding of current disease status and treatment options and is in agreement with the current care plan.  All questions were answered. The patient knows to call the clinic with any problems, questions or concerns. We can certainly see the patient much sooner if necessary.  Thank you so much for allowing me to participate in the care of Jasline L Bynum Higgins. I  will continue to follow up the patient with you and assist in her care. The total time spent in the appointment was 60 minutes including review of chart and various tests results, discussions about plan of care and coordination of care plan .   Disclaimer: This note was dictated with voice recognition software. Similar sounding words can inadvertently be transcribed and may not be corrected upon review.   Sherrod MARLA Sherrod March 20, 2024, 2:28 PM

## 2024-03-27 ENCOUNTER — Telehealth: Payer: Self-pay

## 2024-03-27 ENCOUNTER — Inpatient Hospital Stay: Admission: RE | Admit: 2024-03-27 | Source: Ambulatory Visit

## 2024-03-27 NOTE — Telephone Encounter (Signed)
 Per Dr. Pleas verbally- CT chest completed on 03/13/2024 will suffice. Super D is not needed.   I have spoken to patient and made her aware super D is not needed. Order has been canceled.

## 2024-03-27 NOTE — Telephone Encounter (Signed)
 Copied from CRM [id number]. Topic: Appointments - Scheduling Inquiry for Clinic >> Mar 27, 2024 10:45 AM Corean SAUNDERS wrote: Reason for CRM: Patient was advised by imaging that her appointment for today 10/9 was cancelled by DR. Baral but the patient does not know why and would like to make sure everything is okay. Please call patient back to advise.

## 2024-03-27 NOTE — Addendum Note (Signed)
 Addended by: CLAUDENE NEVINS A on: 03/27/2024 02:16 PM   Modules accepted: Orders

## 2024-03-31 ENCOUNTER — Ambulatory Visit (HOSPITAL_COMMUNITY)
Admission: RE | Admit: 2024-03-31 | Discharge: 2024-03-31 | Disposition: A | Discharge status: Home / Self Care | Source: Ambulatory Visit | Attending: Cardiovascular Disease | Admitting: Cardiovascular Disease

## 2024-03-31 ENCOUNTER — Ambulatory Visit: Payer: Self-pay

## 2024-03-31 DIAGNOSIS — K767 Hepatorenal syndrome: Secondary | ICD-10-CM | POA: Diagnosis present

## 2024-03-31 DIAGNOSIS — J9611 Chronic respiratory failure with hypoxia: Secondary | ICD-10-CM | POA: Insufficient documentation

## 2024-03-31 DIAGNOSIS — R600 Localized edema: Secondary | ICD-10-CM | POA: Diagnosis not present

## 2024-03-31 DIAGNOSIS — R0602 Shortness of breath: Secondary | ICD-10-CM | POA: Insufficient documentation

## 2024-03-31 DIAGNOSIS — R911 Solitary pulmonary nodule: Secondary | ICD-10-CM | POA: Insufficient documentation

## 2024-03-31 DIAGNOSIS — K7460 Unspecified cirrhosis of liver: Secondary | ICD-10-CM | POA: Diagnosis present

## 2024-03-31 DIAGNOSIS — D3A090 Benign carcinoid tumor of the bronchus and lung: Secondary | ICD-10-CM | POA: Diagnosis present

## 2024-03-31 LAB — ECHOCARDIOGRAM COMPLETE BUBBLE STUDY
Area-P 1/2: 3.1 cm2
S' Lateral: 3.2 cm

## 2024-03-31 MED ORDER — SODIUM CHLORIDE 0.9% FLUSH: 10.0000 mL | INTRAVENOUS | Status: AC | PRN

## 2024-03-31 NOTE — Telephone Encounter (Signed)
 FYI Only or Action Required?: Action required by provider: request for appointment.  Patient is followed in Pulmonology for SOB, last seen on 03/12/2024 by Rhonda Newborn, MD.  Called Nurse Triage reporting Shortness of Breath.  Symptoms began several days ago.  Interventions attempted: Nothing.  Symptoms are: unchanged.  Triage Disposition: Call Specialist Today  Patient/caregiver understands and will follow disposition?: Yes        Copied from CRM [id number]. Topic: Clinical - Red Word Triage >> Mar 31, 2024  9:15 AM Essie A wrote: Red Word that prompted transfer to Nurse Triage: Patient is complaining of shortness of breath and tiredness since Saturday.   ----------------------------------------------------------------------- From previous Reason for Contact - Other: Reason for CRM: Reason for Disposition  [1] MILD difficulty breathing (e.g., minimal/no SOB at rest, SOB with walking, pulse < 100) AND [2] NEW-onset or WORSE than normal    Triager offered AV at alternate [facility] location, pt declined. Triager will forward encounter for Dr Rhonda 's office to review and advise. Patient verbalized understanding and is expecting call back from office for next steps. Triager also advised that if pt does not hear back from office, to follow disposition for further evaluation/treatment.  Answer Assessment - Initial Assessment Questions E2C2 Pulmonary Triage - Initial Assessment Questions Chief Complaint (e.g., cough, sob, wheezing, fever, chills, sweat or additional symptoms) *Go to specific symptom protocol after initial questions. SOB with exertion, fatigue  How long have symptoms been present? A few days ago - Saturday   Have you tested for COVID or Flu? Note: If not, ask patient if a home test can be taken. If so, instruct patient to call back for positive results. No  MEDICINES:   Have you used any OTC meds to help with symptoms? No If yes, ask What  medications? N/a  Have you used your inhalers/maintenance medication? No If yes, What medications? N/a  If inhaler, ask How many puffs and how often? Note: Review instructions on medication in the chart. N/a  OXYGEN: Do you wear supplemental oxygen? No If yes, How many liters are you supposed to use? N/a  Do you monitor your oxygen levels? No If yes, What is your reading (oxygen level) today? Does not have spo2  What is your usual oxygen saturation reading?  (Note: Pulmonary O2 sats should be 90% or greater) N/a          1. RESPIRATORY STATUS: Describe your breathing? (e.g., wheezing, shortness of breath, unable to speak, severe coughing)      SOB with exertion 2. ONSET: When did this breathing problem begin?      See above 3. PATTERN Does the difficult breathing come and go, or has it been constant since it started?      Comes and goes 4. SEVERITY: How bad is your breathing? (e.g., mild, moderate, severe)      Mild-moderate Triager does not appreciate audible SOB/wheezing during call. Pt is speaking in full sentences.  5. RECURRENT SYMPTOM: Have you had difficulty breathing before? If Yes, ask: When was the last time? and What happened that time?      Yes, but not as bad. 6. CARDIAC HISTORY: Do you have any history of heart disease? (e.g., heart attack, angina, bypass surgery, angioplasty)      denies 7. LUNG HISTORY: Do you have any history of lung disease?  (e.g., pulmonary embolus, asthma, emphysema)     Denies. Nodules per chart. 8. CAUSE: What do you think is causing the breathing problem?  unknown 9. OTHER SYMPTOMS: Do you have any other symptoms? (e.g., chest pain, cough, dizziness, fever, runny nose)     N/a 10. O2 SATURATION MONITOR:  Do you use an oxygen saturation monitor (pulse oximeter) at home? If Yes, ask: What is your reading (oxygen level) today? What is your usual oxygen saturation reading? (e.g.,  95%)       N/a 11. PREGNANCY: Is there any chance you are pregnant? When was your last menstrual period?       N/a 12. TRAVEL: Have you traveled out of the country in the last month? (e.g., travel history, exposures)       N/a  Protocols used: Breathing Difficulty-A-AH

## 2024-03-31 NOTE — Telephone Encounter (Signed)
 ATC patient x1.  Patient's wife stated she was in an appointment.  Advised I would call back later.  Patient is scheduled for an echo complete with bubble study being done today, 10/13.  She has had a CT scan completed and is scheduled for a PFT (10/15) and f/u with Dr. Pleas (10/27).

## 2024-04-01 NOTE — Telephone Encounter (Signed)
 I called and spoke with the patient, she states that her sob and fatigue has been worse since she saw Dr. Pleas on 03/12/24, she states it got worse on 03/29/2024.  She says she is so tired all she does is sleep.  I advised her that Dr. Pleas had ordered tests to give her some further in site on what is causing the symptoms and she would need the tests completed in order for her to give any further diagnosis on what is causing the symptoms.  She has completed the CT scan, the echo complete with bubble study and her PFT is scheduled for 04/02/24.  I let her know that once all the tests have been completed and she has a chance to review the data she will have us  contact her with the results and recommendations going forward.  I asked her if she has ever had a sleep study and she stated yes.  She says she used to wear a CPAP machine, however she lost weight and then did not need it any further.  I let her know I would make Dr. Pleas aware that her symptoms have gotten worse.  Dr. Pleas, Patient is complaining that her sob and fatigue have gotten worse since 10/11.  She says she sleeps a lot.  I advised her that you were waiting for all the test to be completed to give you further onsite on what is going on and causing these symptoms.  She has completed the CT scan and the Echo complete with bubble study.  She is having her PFT done on 10/15.  Please advise if you have any further recommendations prior to getting all the results from her testing.  Thank you.

## 2024-04-01 NOTE — Telephone Encounter (Addendum)
 Her ECHO and CT chest don't explain her shortness of breath and fatigue. We have referred her to oncology for the growth in her lung nodule but that is a small nodule/mass still and wouldn't explain dyspnea.  We will review when her PFT is available. I did notice a drop in her hemoglobin and with her hx of cirrhosis, I wonder if she is having blood loss in her GI tract. Anemia can cause new fatigue or shortness of breath as well. If she has significant change in her dyspnea compared to her baseline, I would advise she visit urgent care  to get EKG, heart enzymes and repeat labs.

## 2024-04-01 NOTE — Telephone Encounter (Signed)
 Copied from CRM [id number]. Topic: General - Other >> Apr 01, 2024 10:35 AM Rilla NOVAK wrote: Reason for CRM: Patient returning call to office from [facility]. I did go over scheduled PFT 10/15 and follow up visit 10/27.  If you need anything additional, please patient @ [phone].  nfn

## 2024-04-01 NOTE — Telephone Encounter (Signed)
 Patient returned call.  No one was able to take the call at that time.

## 2024-04-02 ENCOUNTER — Ambulatory Visit

## 2024-04-02 ENCOUNTER — Telehealth: Payer: Self-pay | Admitting: *Deleted

## 2024-04-02 DIAGNOSIS — R0602 Shortness of breath: Secondary | ICD-10-CM

## 2024-04-02 DIAGNOSIS — R911 Solitary pulmonary nodule: Secondary | ICD-10-CM

## 2024-04-02 DIAGNOSIS — R600 Localized edema: Secondary | ICD-10-CM

## 2024-04-02 DIAGNOSIS — K7460 Unspecified cirrhosis of liver: Secondary | ICD-10-CM

## 2024-04-02 DIAGNOSIS — J9611 Chronic respiratory failure with hypoxia: Secondary | ICD-10-CM

## 2024-04-02 DIAGNOSIS — D3A090 Benign carcinoid tumor of the bronchus and lung: Secondary | ICD-10-CM

## 2024-04-02 LAB — PULMONARY FUNCTION TEST
DL/VA % pred: 102 %
DL/VA: 4.38 ml/min/mmHg/L
DLCO cor % pred: 84 %
DLCO cor: 17.04 ml/min/mmHg
DLCO unc % pred: 76 %
DLCO unc: 15.3 ml/min/mmHg
FEF 25-75 Post: 3.71 L/s
FEF 25-75 Pre: 2.8 L/s
FEF2575-%Change-Post: 32 %
FEF2575-%Pred-Post: 147 %
FEF2575-%Pred-Pre: 111 %
FEV1-%Change-Post: 7 %
FEV1-%Pred-Post: 92 %
FEV1-%Pred-Pre: 86 %
FEV1-Post: 2.43 L
FEV1-Pre: 2.25 L
FEV1FVC-%Change-Post: 2 %
FEV1FVC-%Pred-Pre: 106 %
FEV6-%Change-Post: 5 %
FEV6-%Pred-Post: 87 %
FEV6-%Pred-Pre: 82 %
FEV6-Post: 2.82 L
FEV6-Pre: 2.68 L
FEV6FVC-%Pred-Post: 103 %
FEV6FVC-%Pred-Pre: 103 %
FVC-%Change-Post: 5 %
FVC-%Pred-Post: 84 %
FVC-%Pred-Pre: 80 %
FVC-Post: 2.82 L
FVC-Pre: 2.68 L
Post FEV1/FVC ratio: 86 %
Post FEV6/FVC ratio: 100 %
Pre FEV1/FVC ratio: 84 %
Pre FEV6/FVC Ratio: 100 %
RV % pred: 143 %
RV: 2.65 L
TLC % pred: 108 %
TLC: 5.37 L

## 2024-04-02 NOTE — Telephone Encounter (Signed)
 I called and spoke with the pt and notified of response from Dr. Pleas. She verbalized understanding. Nothing further needed.

## 2024-04-02 NOTE — Telephone Encounter (Signed)
 Copied from CRM [id number]. Topic: General - Other >> Mar 31, 2024  1:41 PM Joesph PARAS wrote: Reason for CRM: Patient is calling to request Powell call her back.  This is a duplicate encounter

## 2024-04-02 NOTE — Patient Instructions (Signed)
 Full pft performed today

## 2024-04-02 NOTE — Progress Notes (Signed)
 Full pft performed today

## 2024-04-10 ENCOUNTER — Ambulatory Visit: Admitting: Podiatry

## 2024-04-10 ENCOUNTER — Ambulatory Visit

## 2024-04-10 VITALS — Ht 63.0 in | Wt 215.2 lb

## 2024-04-10 DIAGNOSIS — M19072 Primary osteoarthritis, left ankle and foot: Secondary | ICD-10-CM | POA: Diagnosis not present

## 2024-04-10 DIAGNOSIS — M25572 Pain in left ankle and joints of left foot: Secondary | ICD-10-CM

## 2024-04-10 DIAGNOSIS — M216X2 Other acquired deformities of left foot: Secondary | ICD-10-CM

## 2024-04-10 NOTE — Progress Notes (Signed)
  Subjective:  Patient ID: Rhonda Higgins, female    DOB: [birth year],  MRN: [id number]  Chief Complaint  Patient presents with   Foot Pain    Rm 7 Patient is here for pain on the anterior aspect of the left ankle. Pt states it feels like the ankle joint is locking when dorsiflexing toes. Symptoms have been present for 3-4 weeks.    Discussed the use of AI scribe software for clinical note transcription with the patient, who gave verbal consent to proceed.  History of Present Illness Rhonda Higgins is a 53 year old female who presents with right ankle locking and pain.  She experiences frequent locking and cracking of the right ankle during movement, with locking occurring in certain positions, leading to a 'snap' and a pinching sensation. This has been happening frequently, including twice on the way to the appointment today.  She has a history of a right ankle fracture approximately 15-20 years ago, which did not require surgery, and a hairline fracture in second grade treated with a cast after initial x-rays were inconclusive. No recent x-ray has been performed on this side.  No current pain unless the ankle locks up, and it takes a minute to get the ankle moving again after it locks.      Objective:    Physical Exam VASCULAR: DP and PT pulse palpable. Foot is warm and well-perfused. Capillary fill time is brisk. DERMATOLOGIC: Normal skin turgor, texture, and temperature. No open lesions, rashes, or ulcerations. NEUROLOGIC: Normal sensation to light touch and pressure. No paresthesias. ORTHOPEDIC: Limited range of motion in the ankle joint with anterior pain to palpation and impingement. No bruising, major pitting edema, or instability. No ecchymosis or gross deformity.   No images are attached to the encounter.    Results RADIOLOGY Ankle X-ray: No osteochondral defect, adequate joint space, mild malalignment, osteophyte formation on the medial malleolus  and dorsal anterior talus, presence of os trigonum (04/10/2024)   Assessment:   1. Arthritis of left ankle      Plan:  Patient was evaluated and treated and all questions answered.  Assessment and Plan Assessment & Plan Right anterior ankle impingement and arthritis secondary to prior fracture Right anterior ankle impingement likely due to scar tissue from previous ankle fracture. Symptoms include locking and cracking of the ankle with movement, and anterior pain on palpation. Radiographs show no osteochondral defect, adequate joint space, and some osteophyte formation. Condition is relatively nonlimiting and mostly asymptomatic. - Apply Voltaren gel topically four times a day for one month for symptomatic relief. - Consider corticosteroid injection if symptoms do not improve. - Report if condition worsens.      No follow-ups on file.

## 2024-04-14 ENCOUNTER — Ambulatory Visit

## 2024-04-14 VITALS — BP 124/83 | HR 51 | Temp 98.1°F | Ht 63.0 in | Wt 217.0 lb

## 2024-04-14 DIAGNOSIS — K7460 Unspecified cirrhosis of liver: Secondary | ICD-10-CM

## 2024-04-14 DIAGNOSIS — R0602 Shortness of breath: Secondary | ICD-10-CM | POA: Diagnosis not present

## 2024-04-14 DIAGNOSIS — R0989 Other specified symptoms and signs involving the circulatory and respiratory systems: Secondary | ICD-10-CM | POA: Diagnosis not present

## 2024-04-14 DIAGNOSIS — R001 Bradycardia, unspecified: Secondary | ICD-10-CM | POA: Diagnosis not present

## 2024-04-14 DIAGNOSIS — D638 Anemia in other chronic diseases classified elsewhere: Secondary | ICD-10-CM | POA: Diagnosis not present

## 2024-04-14 DIAGNOSIS — D3A090 Benign carcinoid tumor of the bronchus and lung: Secondary | ICD-10-CM

## 2024-04-14 LAB — CBC WITH DIFFERENTIAL/PLATELET
Basophils Absolute: 0 K/uL (ref 0.0–0.1)
Basophils Relative: 0.7 % (ref 0.0–3.0)
Eosinophils Absolute: 0.1 K/uL (ref 0.0–0.7)
Eosinophils Relative: 2.1 % (ref 0.0–5.0)
HCT: 36.2 % (ref 36.0–46.0)
Hemoglobin: 11.4 g/dL — ABNORMAL LOW (ref 12.0–15.0)
Lymphocytes Relative: 25.7 % (ref 12.0–46.0)
Lymphs Abs: 0.9 K/uL (ref 0.7–4.0)
MCHC: 31.5 g/dL (ref 30.0–36.0)
MCV: 77.9 fl — ABNORMAL LOW (ref 78.0–100.0)
Monocytes Absolute: 0.3 K/uL (ref 0.1–1.0)
Monocytes Relative: 9.3 % (ref 3.0–12.0)
Neutro Abs: 2.2 K/uL (ref 1.4–7.7)
Neutrophils Relative %: 62.2 % (ref 43.0–77.0)
Platelets: 97 K/uL — ABNORMAL LOW (ref 150.0–400.0)
RBC: 4.64 Mil/uL (ref 3.87–5.11)
RDW: 16.4 % — ABNORMAL HIGH (ref 11.5–15.5)
WBC: 3.5 K/uL — ABNORMAL LOW (ref 4.0–10.5)

## 2024-04-14 MED ORDER — ADVAIR HFA 230-21 MCG/ACT IN AERO: 2.0000 | INHALATION_SPRAY | Freq: Two times a day (BID) | RESPIRATORY_TRACT | 6 refills | Status: AC

## 2024-04-14 NOTE — Patient Instructions (Signed)
 It was a pleasure to see you today. Please have your labs drawn in our clinic today. Advair inhaler has been sent to your pharmacy- use 2 puff twice a day and rinse mouth after inhaler use.

## 2024-04-14 NOTE — Assessment & Plan Note (Signed)
 Orders:    CBC with Differential

## 2024-04-14 NOTE — Progress Notes (Signed)
 Initial HPI   PATIENT ID: Ramiro LITTIE Rubinstein Applegarth GENDER: female DOB: 1970-11-09, MRN: [id number]   HPI Ms Applegarth is a 53 Y O F who is here for shortness of breath.  She was previously seen by Dr Rubinstein in this clinic. She was found to have a 1.2 left lower lobe perihilar rounded pulmonary nodule on MRI of the abdomen done 09/30/2020. Not seen on chest x-ray from 11/24/2020. PET scan performed 03/04/2021 and reviewed by me shows a 1.5 x 1.3 rounded left lower lobe pulmonary nodule. There is only low-grade metabolic activity (SUV 4.1). No mediastinal adenopathy or any evidence of distant disease. Slightly increased in size based on comparison with a CT abdomen 10/16/2018  She was last seen in this clinic by DR Byrum in 04/2021 following a bronchoscopic biopsy of 1.5 by 1.1 cm left lower lobe nodule. Following day, her biopsy resulted as neuroendocrine tumor, atypical carcinoid tumor and Dr Shelah called her with results. Pt hasn't followed up with pulmonary clinic since then. She is wanting to have a follow up of that nodule as well  Pt doesn't recall undergoing bronchoscopies and insists she would remember if she did undergo. She reports she has undergone EGD (for cirrhosis, vairces- Dr Saintclair) and thyroid  biopsies.   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   ECHO 07/2014 by Dr LEVERN is within normal limits. She has chronic pedal edema, she attributes it to oral vancomycin (c diff hx).  She also reports low oxygen saturation at pain clinic. Reports shortness of breath at rest   Has gained 3 pounds since discontinuing ozempic  Had anemia last year but recent hb has been within normal limits    She lives with her wife. 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    Subjective:  10/17- still has shortness of breath- at rest and exertion Denies wheezing but did notice some improvement on shortness of  breath with albuterol  during PFT test Completed ECHO and CT chest, here to review results With her wife in clinic today    Past Medical History:  Diagnosis Date   Adenocarcinoma (HCC)    endometrial, FIGO GRADE 1   Allergic rhinitis    Arthritis    Atypical chest pain    History of   Borderline personality disorder (HCC)    Cirrhosis (HCC)    Depression    Elevated liver enzymes    GERD (gastroesophageal reflux disease)    Hematuria    History of endometrial cancer 08-02-2009   oncologist-  dr brewster/ [place] and dr kinard/  no recurrence   endometrial adenocarinoma Stage 1B, Grade 1, FIGO--  s/p  TAH w/ BSO and pelvic lymph node dissection's and radiation therapy   History of kidney stones    History of radiation therapy    2011  pelvic intracavity brachytherapy treatment's for endometrial carcinoma   History of thyroid  nodule    multinodular goiter s/p  total thyroidectomy 11-19-2015  per pathology -  adenomatoid nodules   Hyperlipidemia    Hypothyroidism, postsurgical    Insulin  dependent diabetes mellitus    Type 2   Left ureteral stone    Obesity    OSA (obstructive sleep apnea)    sleep study 2017 showed OSA negative (pt had OSA in 2011, and lost weight, no issues since)   Overactive bladder    Personality disorder (HCC)    Polyphagia(783.6)  PONV (postoperative nausea and vomiting)    after ear surgery only one time   Right lower quadrant pain    Umbilical hernia    Urgency of urination    UTI (urinary tract infection)      Family History  Problem Relation Age of Onset   Asthma Mother    Heart disease Mother    Diabetes Mother    Emphysema Mother    Hypertension Mother    Stroke Mother    Prostate cancer Father    Heart disease Sister    COPD Sister        smoker   Heart disease Sister    Asthma Sister    Diabetes Sister    Breast cancer Sister 31   COPD Sister        smoker   Heart attack Brother    Heart disease Brother      Social  History   Socioeconomic History   Marital status: Married    Spouse name: Not on file   Number of children: 0   Years of education: 10   Highest education level: 10th grade  Occupational History   Occupation: n/a  Tobacco Use   Smoking status: Never    Passive exposure: Past   Smokeless tobacco: Never  Vaping Use   Vaping status: Never Used  Substance and Sexual Activity   Alcohol use: No   Drug use: No   Sexual activity: Yes    Birth control/protection: None  Other Topics Concern   Not on file  Social History Narrative   Lives with partner, Levon Rubinstein.   Social Drivers of Corporate Investment Banker Strain: Low Risk  (08/20/2022)   Received from [facility]   Overall Financial Resource Strain (CARDIA)    Difficulty of Paying Living Expenses: Not hard at all  Food Insecurity: No Food Insecurity (03/20/2024)   Hunger Vital Sign    Worried About Running Out of Food in the Last Year: Never true    Ran Out of Food in the Last Year: Never true  Transportation Needs: No Transportation Needs (03/20/2024)   PRAPARE - Administrator, Civil Service (Medical): No    Lack of Transportation (Non-Medical): No  Physical Activity: Unknown (08/20/2022)   Received from [facility]   Exercise Vital Sign    On average, how many days per week do you engage in moderate to strenuous exercise (like a brisk walk)?: 0 days    Minutes of Exercise per Session: Not on file  Recent Concern: Physical Activity - Inactive (08/20/2022)   Received from [facility]   Exercise Vital Sign    Days of Exercise per Week: 0 days    Minutes of Exercise per Session: 0 min  Stress: Stress Concern Present (08/20/2022)   Received from [facility] of Occupational Health - Occupational Stress Questionnaire    Feeling of Stress : Very much  Social Connections: Socially Integrated (08/20/2022)   Received from [facility]   Social Network    How would you rate your social network  (family, work, friends)?: Good participation with social networks  Intimate Partner Violence: Not At Risk (03/20/2024)   Humiliation, Afraid, Rape, and Kick questionnaire    Fear of Current or Ex-Partner: No    Emotionally Abused: No    Physically Abused: No    Sexually Abused: No     Allergies  Allergen Reactions   Tape Rash    Paper  tape is ok   Wound Dressing Adhesive Rash   Pholcodine Itching   Semaglutide Nausea And Vomiting   Codeine Itching     Outpatient Medications Prior to Visit  Medication Sig Dispense Refill   ACCU-CHEK GUIDE test strip      Accu-Chek Softclix Lancets lancets daily.     atorvastatin  (LIPITOR) 40 MG tablet Take 40 mg by mouth daily.     dicyclomine (BENTYL) 20 MG tablet Take 20 mg by mouth 4 (four) times daily as needed for spasms.     ezetimibe (ZETIA) 10 MG tablet Take 10 mg by mouth daily.     FLUoxetine (PROZAC) 20 MG capsule Take 20 mg by mouth daily.     gabapentin  (NEURONTIN ) 400 MG capsule Take 400-1,200 mg by mouth See admin instructions. Takes 400 mg in the morning 400 mg in the afternoon and 1200 mg at night     HYDROcodone -acetaminophen  (NORCO/VICODIN) 5-325 MG tablet Take 1 tablet by mouth every 12 (twelve) hours.     hydrOXYzine  (ATARAX ) 25 MG tablet Take 25 mg by mouth 3 (three) times daily as needed for anxiety.     insulin  glargine (LANTUS ) 100 UNIT/ML injection Inject 18 Units into the skin at bedtime.     Insulin  Pen Needle 32G X 4 MM MISC 1 each by Does not apply route.     lisinopril  (PRINIVIL ,ZESTRIL ) 5 MG tablet Take 5 mg by mouth daily.     metFORMIN  (GLUCOPHAGE -XR) 500 MG 24 hr tablet Take 500 mg by mouth 2 (two) times daily.     ondansetron  (ZOFRAN -ODT) 4 MG disintegrating tablet Take 4 mg by mouth every 6 (six) hours as needed for vomiting or nausea.     pantoprazole  (PROTONIX ) 40 MG tablet Take 40 mg by mouth 2 (two) times daily.   0   Peppermint Oil (IBGARD) 90 MG CPCR 2 capsules Orally once a day; Duration: 30 days      promethazine  (PHENERGAN ) 12.5 MG tablet 1 tablet as needed Orally every 6 hrs for 30 days     risperiDONE  (RISPERDAL ) 2 MG tablet Take 2 mg by mouth at bedtime.   2   SYNTHROID  175 MCG tablet Take 175 mcg by mouth daily.     TRADJENTA  5 MG TABS tablet Take 5 mg by mouth.     Vitamin D, Ergocalciferol, (DRISDOL) 1.25 MG (50000 UNIT) CAPS capsule Take 50,000 Units by mouth once a week.     amoxicillin (AMOXIL) 875 MG tablet Take 875 mg by mouth 2 (two) times daily.     metoCLOPramide  (REGLAN ) 10 MG tablet Take 1 tablet (10 mg total) by mouth every 6 (six) hours. 30 tablet 0   naloxone (NARCAN) nasal spray 4 mg/0.1 mL SMARTSIG:Both Nares (Patient not taking: Reported on 04/14/2024)     sucralfate  (CARAFATE ) 1 g tablet Take 1 tablet (1 g total) by mouth 4 (four) times daily -  with meals and at bedtime. (Patient not taking: Reported on 04/14/2024) 120 tablet 0   vancomycin (VANCOCIN) 250 MG capsule Take 250 mg by mouth 4 (four) times daily. (Patient not taking: Reported on 04/14/2024)     No facility-administered medications prior to visit.    ROS Reviewed all systems and reported negative except as above     Objective:   Vitals:   04/14/24 0820  BP: 124/83  Pulse: (!) 51  Temp: 98.1 F (36.7 C)  TempSrc: Oral  SpO2: 98%  Weight: 217 lb (98.4 kg)  Height: 5' 3 (1.6 m)  Physical Exam Constitutional:      General: She is not in acute distress.    Appearance: Normal appearance.  HENT:     Mouth/Throat:     Mouth: Mucous membranes are moist.     Pharynx: Oropharynx is clear.  Cardiovascular:     Rate and Rhythm: Normal rate.  Pulmonary:     Effort: Pulmonary effort is normal.     Breath sounds: Normal breath sounds.  Abdominal:     General: There is distension.  Musculoskeletal:     Right lower leg: No edema.  Skin:    General: Skin is warm.  Neurological:     Mental Status: She is oriented to person, place, and time.  Psychiatric:        Mood and Affect: Mood  normal.        Behavior: Behavior normal.        CBC    Component Value Date/Time   WBC 2.6 (L) 03/20/2024 1512   WBC 3.1 (L) 09/17/2023 1332   RBC 4.02 03/20/2024 1512   HGB 10.5 (L) 03/20/2024 1512   HCT 32.7 (L) 03/20/2024 1512   PLT 73 (L) 03/20/2024 1512   MCV 81.3 03/20/2024 1512   MCH 26.1 03/20/2024 1512   MCHC 32.1 03/20/2024 1512   RDW 15.6 (H) 03/20/2024 1512   LYMPHSABS 0.7 03/20/2024 1512   MONOABS 0.2 03/20/2024 1512   EOSABS 0.0 03/20/2024 1512   BASOSABS 0.0 03/20/2024 1512   Reviewed prior PET and CT  Reviewed prior bronchoscopy notes from 03/2021 and prior ECHO Reviewed prior cytology notes Reviewed prior pulmonary notes by Dr Shelah and telephone encounters  CT chest 03/2021 Lungs/Pleura: Central airways are patent. Bilateral lower lung linear opacities, likely due to atelectasis. No consolidation, pleural effusion or pneumothorax. Nodule of the left lower lobe located adjacent to the hilum measuring 1.5 x 1.3 cm, unchanged in size.  Chest imaging: 03/2021 chest xray Mild atelectatic changes in the mid and lower lungs. No pneumothorax post procedure  PFT:    Latest Ref Rng & Units 04/02/2024    3:56 PM  PFT Results  FVC-Pre L 2.68  P  FVC-Predicted Pre % 80  P  FVC-Post L 2.82  P  FVC-Predicted Post % 84  P  Pre FEV1/FVC % % 84  P  Post FEV1/FCV % % 86  P  FEV1-Pre L 2.25  P  FEV1-Predicted Pre % 86  P  FEV1-Post L 2.43  P  DLCO uncorrected ml/min/mmHg 15.30  P  DLCO UNC% % 76  P  DLCO corrected ml/min/mmHg 17.04  P  DLCO COR %Predicted % 84  P  DLVA Predicted % 102  P  TLC L 5.37  P  TLC % Predicted % 108  P  RV % Predicted % 143  P    P Preliminary result    CT chest 03/2024 1. Continued slow interval growth of a solid pulmonary nodule in the perihilar aspect of the left lower lobe measuring 16 x 16 mm, previously measured 13 x 13 mm on 04/12/2021. Findings are concerning for a slow-growing neoplasm. Tissue sampling  recommended if not previously performed. 2. Cirrhosis with sequela of portal hypertension. 3. Aortic and coronary artery atherosclerosis   Echo 03/2024  1. Left ventricular ejection fraction, by estimation, is 60 to 65%. The  left ventricle has normal function. The left ventricle has no regional  wall motion abnormalities. Left ventricular diastolic parameters were  normal.   2. Right ventricular systolic  function is normal. The right ventricular  size is normal.   3. Left atrial size was moderately dilated.   4. No subcostal window . Agitated saline contrast bubble study was  negative, with no evidence of any interatrial shunt.   5. Right atrial size was mildly dilated.   6. The mitral valve is abnormal. Trivial mitral valve regurgitation. No  evidence of mitral stenosis.   7. The aortic valve is tricuspid. Aortic valve regurgitation is not  visualized. No aortic stenosis is present.   8. The inferior vena cava is normal in size with greater than 50%  respiratory variability, suggesting right atrial pressure of 3 mmHg.      PFT 04/02/2024 Reviewed Noticeable for air trapping  Assessment & Plan:    Assessment & Plan SOB (shortness of breath) Multifactorial PFT doesn't show obstruction but showed air trapping Also noticed hb drop by 2 points on labs from 03/20/2024- will recheck today given hx of cirrhosis No other explanation for dyspnea based on PFT, CT chest and ECHO Have advised pt to contact her cardiology office to rule out cardiac causes of new/worsening dyspnea. Also asymptomatic bradycardia noticed in clinic today - pt is not on betablocker Orders:   CBC with Differential  Pulmonary air trapping Non smoker Can't rule out asthma although no obstruction on PFT, there is some BD response Advised pt to rinse your mouth after inhaler use. Orders:   CBC with Differential   fluticasone-salmeterol (ADVAIR HFA) 230-21 MCG/ACT inhaler; Inhale 2 puffs into the lungs 2  (two) times daily.  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

## 2024-04-15 ENCOUNTER — Encounter: Payer: Self-pay | Admitting: Thoracic Surgery (Cardiothoracic Vascular Surgery)

## 2024-04-15 ENCOUNTER — Ambulatory Visit
Attending: Thoracic Surgery (Cardiothoracic Vascular Surgery) | Admitting: Thoracic Surgery (Cardiothoracic Vascular Surgery)

## 2024-04-15 VITALS — BP 127/80 | HR 66 | Ht 63.0 in | Wt 219.8 lb

## 2024-04-15 DIAGNOSIS — R911 Solitary pulmonary nodule: Secondary | ICD-10-CM | POA: Insufficient documentation

## 2024-04-15 DIAGNOSIS — C7A8 Other malignant neuroendocrine tumors: Secondary | ICD-10-CM | POA: Insufficient documentation

## 2024-04-15 NOTE — Progress Notes (Signed)
 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  She was found to have a lung nodule back in 2022.  She had a bronchoscopy and biopsy showed carcinoid tumor.  Apparently the plan was to follow this radiographically, but she was then lost to follow-up.  She recently was referred back to pulmonology for shortness of breath and was seen by Dr. Pleas.  She ordered a follow-up CT which showed the nodule increased in size from 1.3 to 1.5 cm in diameter.  She is disabled secondary to borderline disorder.  She complains of shortness of breath.  When this happens she just feels like she cannot take a deep breath.  It can occur at rest or with exertion.  She has not noticed any pattern to it.  She will sometimes have waxing and waning symptoms for up to 3 days at a time.  Dr. Pleas did pulmonary function testing and echocardiogram neither of which explains her symptoms.  No change in appetite or weight loss.  No chest pain, pressure, or tightness.  Zubrod Score: At the time of surgery this patient's most appropriate activity status/level should be described as: []     0    Normal activity, no symptoms []     1    Restricted in physical strenuous activity but ambulatory, able to do out light work [x]     2    Ambulatory and capable of self care, unable to do work activities, up and about >50 % of waking hours                              []      3    Only limited self care, in bed greater than 50% of waking hours []     4    Completely disabled, no self care, confined to bed or chair []     5    Moribund  Past Medical History:  Diagnosis Date   Adenocarcinoma (HCC)    endometrial, FIGO GRADE 1   Allergic rhinitis    Arthritis    Atypical chest pain    History of   Borderline personality disorder (HCC)    Cirrhosis (HCC)    Depression    Elevated liver enzymes    GERD (gastroesophageal reflux disease)    Hematuria    History of endometrial cancer 08-02-2009   oncologist-  dr brewster/ eloy and dr kinard/  no recurrence   endometrial adenocarinoma Stage 1B, Grade 1, FIGO--  s/p  TAH w/ BSO and pelvic lymph node dissection's and radiation therapy   History of kidney stones    History of radiation therapy    2011  pelvic intracavity brachytherapy treatment's for endometrial carcinoma   History of thyroid  nodule    multinodular goiter s/p  total thyroidectomy 11-19-2015  per pathology -  adenomatoid nodules   Hyperlipidemia    Hypothyroidism, postsurgical    Insulin  dependent diabetes mellitus    Type 2  Left ureteral stone    Obesity    OSA (obstructive sleep apnea)    sleep study 2017 showed OSA negative (pt had OSA in 2011, and lost weight, no issues since)   Overactive bladder    Personality disorder (HCC)    Polyphagia(783.6)    PONV (postoperative nausea and vomiting)    after ear surgery only one time   Right lower quadrant pain    Umbilical hernia    Urgency of urination    UTI (urinary tract infection)     Past Surgical History:  Procedure Laterality Date   ANTERIOR CERVICAL DECOMP/DISCECTOMY FUSION N/A 01/26/2023   Procedure: C5-6, C6-7 ANTERIOR CERVICAL DECOMPRESSION/DISCECTOMY FUSION 2 LEVELS;  Surgeon: Georgina Ozell LABOR, MD;  Location: [facility];  Service: Orthopedics;  Laterality: N/A;   BIOPSY  04/25/2022   Procedure: BIOPSY;  Surgeon: Saintclair Jasper, MD;  Location: [facility];  Service: Gastroenterology;;    BIOPSY  04/10/2023   Procedure: BIOPSY;  Surgeon: Saintclair Jasper, MD;  Location: [facility];  Service: Gastroenterology;;   BRONCHIAL BIOPSY  04/18/2021   Procedure: BRONCHIAL BIOPSIES;  Surgeon: Shelah Lamar RAMAN, MD;  Location: [facility]COPY;  Service: Pulmonary;;   BRONCHIAL BRUSHINGS  04/18/2021   Procedure: BRONCHIAL BRUSHINGS;  Surgeon: Shelah Lamar RAMAN, MD;  Location: [facility];  Service: Pulmonary;;   BRONCHIAL NEEDLE ASPIRATION BIOPSY  04/18/2021   Procedure: BRONCHIAL NEEDLE ASPIRATION BIOPSIES;  Surgeon: Shelah Lamar RAMAN, MD;  Location: [facility];  Service: Pulmonary;;   CARDIOVASCULAR STRESS TEST  06/09/2008   normal nuclear study w/ no ischemia/  normal LV function and wall motion , ef 83%   CHOLECYSTECTOMY N/A 09/06/2017   Procedure: LAPAROSCOPIC CHOLECYSTECTOMY WITH INTRAOPERATIVE CHOLANGIOGRAM;  Surgeon: Eletha Boas, MD;  Location: [facility];  Service: General;  Laterality: N/A;   COLONOSCOPY WITH PROPOFOL  N/A 04/25/2022   Procedure: COLONOSCOPY WITH PROPOFOL ;  Surgeon: Saintclair Jasper, MD;  Location: [facility];  Service: Gastroenterology;  Laterality: N/A;   CYSTOSCOPY/RETROGRADE/URETEROSCOPY/STONE EXTRACTION WITH BASKET Left 03/08/2016   Procedure: CYSTOSCOPY/RETROGRADE/URETEROSCOPY/STONE EXTRACTION WITH BASKET, STENT PLACEMENT;  Surgeon: Alm Fragmin, MD;  Location: [facility];  Service: Urology;  Laterality: Left;   CYSTOSCOPY/URETEROSCOPY/HOLMIUM LASER/STENT PLACEMENT Left 07/27/2023   Procedure: CYSTOSCOPYLEFT/URETEROSCOPY/HOLMIUM LASER/STENT PLACEMENT;  Surgeon: Carolee Sherwood JONETTA DOUGLAS, MD;  Location: [facility];  Service: Urology;  Laterality: Left;   ENDOMETRIAL BIOPSY     ERCP N/A 09/07/2017   Procedure: ENDOSCOPIC RETROGRADE CHOLANGIOPANCREATOGRAPHY (ERCP);  Surgeon: Saintclair Jasper, MD;  Location: [facility];  Service: Gastroenterology;  Laterality: N/A;   ESOPHAGEAL BANDING  04/10/2023   Procedure: ESOPHAGEAL BANDING;  Surgeon: Saintclair Jasper, MD;  Location: [facility];  Service: Gastroenterology;;   ESOPHAGOGASTRODUODENOSCOPY N/A 04/25/2022   Procedure: ESOPHAGOGASTRODUODENOSCOPY (EGD);  Surgeon: Saintclair Jasper, MD;  Location: [facility];  Service: Gastroenterology;  Laterality: N/A;   ESOPHAGOGASTRODUODENOSCOPY N/A 04/10/2023   Procedure: ESOPHAGOGASTRODUODENOSCOPY (EGD);  Surgeon: Saintclair Jasper, MD;  Location: [facility];  Service: Gastroenterology;  Laterality: N/A;  with possible banding for esophageal varices   ESOPHAGOGASTRODUODENOSCOPY N/A 11/20/2023   Procedure: EGD (ESOPHAGOGASTRODUODENOSCOPY);  Surgeon: Saintclair Jasper, MD;  Location: [facility];  Service: Gastroenterology;  Laterality: N/A;   ESOPHAGOGASTRODUODENOSCOPY (EGD) WITH PROPOFOL  N/A 03/16/2020   Procedure: ESOPHAGOGASTRODUODENOSCOPY (EGD) WITH PROPOFOL ;  Surgeon: Saintclair Jasper, MD;  Location: [facility];  Service: Gastroenterology;  Laterality: N/A;  unable to locate ampulla, aborted ERCP, changed to EGD   HEMOSTASIS CLIP PLACEMENT  04/25/2022   Procedure: HEMOSTASIS CLIP PLACEMENT;  Surgeon: Saintclair Jasper, MD;  Location: [facility];  Service: Gastroenterology;;   HEMOSTASIS CONTROL  04/25/2022   Procedure: HEMOSTASIS CONTROL;  Surgeon: Saintclair Jasper, MD;  Location: [facility];  Service: Gastroenterology;;   HOLMIUM LASER APPLICATION Left 03/08/2016   Procedure: HOLMIUM LASER APPLICATION;  Surgeon: Alm Fragmin, MD;  Location: [facility];  Service: Urology;  Laterality: Left;   MOUTH SURGERY     MYRINGECOTMY W/ REMOVAL MIDDLE EAR CHOLESTEATOMA TYPE 1 FASICA TYMPANOPLASTY  09/13/2000   POLYPECTOMY  04/25/2022   Procedure: POLYPECTOMY;  Surgeon: Saintclair Jasper, MD;  Location: [facility];  Service: Gastroenterology;;   ROBOTIC ASSISTED TOTAL HYSTERECTOMY WITH BILATERAL SALPINGO OOPHERECTOMY  08-02-2009   at [facility]  dr eloy   w/  Bilateral pelvic and para aortic lymph node dissection's   THYROIDECTOMY N/A 11/19/2015   Procedure: TOTAL THYROIDECTOMY;  Surgeon: Krystal Spinner, MD;   Location: [facility];  Service: General;  Laterality: N/A;   TONSILLECTOMY  age 62   TRANSTHORACIC ECHOCARDIOGRAM  07/19/2014   ef 55-60%/  trivial TR   TYMPANOPLASTY Right 1993   VIDEO BRONCHOSCOPY WITH ENDOBRONCHIAL NAVIGATION Left 04/18/2021   Procedure: ROBOTIC ASSISTED BRONCHOSCOPY WITH ENDOBRONCHIAL NAVIGATION;  Surgeon: Shelah Lamar RAMAN, MD;  Location: [facility];  Service: Pulmonary;  Laterality: Left;   VIDEO BRONCHOSCOPY WITH RADIAL ENDOBRONCHIAL ULTRASOUND  04/18/2021   Procedure: RADIAL ENDOBRONCHIAL ULTRASOUND;  Surgeon: Shelah Lamar RAMAN, MD;  Location: [facility];  Service: Pulmonary;;    Family History  Problem Relation Age of Onset   Asthma Mother    Heart disease Mother    Diabetes Mother    Emphysema Mother    Hypertension Mother    Stroke Mother    Prostate cancer Father    Heart disease Sister    COPD Sister        smoker   Heart disease Sister    Asthma Sister    Diabetes Sister    Breast cancer Sister 32   COPD Sister        smoker   Heart attack Brother    Heart disease Brother     Social History Social History   Tobacco Use   Smoking status: Never    Passive exposure: Past   Smokeless tobacco: Never  Vaping Use   Vaping status: Never Used  Substance Use Topics   Alcohol use: No   Drug use: No    Current Outpatient Medications  Medication Sig Dispense Refill   ACCU-CHEK GUIDE test strip      Accu-Chek Softclix Lancets lancets daily.     atorvastatin  (LIPITOR) 40 MG tablet Take 40 mg by mouth daily.     dicyclomine (BENTYL) 20 MG tablet Take 20 mg by mouth 4 (four) times daily as needed for spasms.     ezetimibe (ZETIA) 10 MG tablet Take 10 mg by mouth daily.     FLUoxetine (PROZAC) 20 MG capsule Take 20 mg by mouth daily.     fluticasone-salmeterol (ADVAIR HFA) 230-21 MCG/ACT inhaler Inhale 2 puffs into the lungs 2 (two) times daily. 12 g 6   gabapentin  (NEURONTIN ) 400 MG capsule Take 400-1,200 mg by mouth See admin instructions. Takes 400  mg in the morning 400 mg in the afternoon and 1200 mg at night     HYDROcodone -acetaminophen  (NORCO/VICODIN) 5-325 MG tablet Take 1 tablet by mouth every 12 (twelve) hours.     hydrOXYzine  (ATARAX ) 25 MG tablet Take 25 mg by mouth 3 (three) times daily as needed for anxiety.     insulin  glargine (LANTUS ) 100 UNIT/ML  injection Inject 18 Units into the skin at bedtime.     Insulin  Pen Needle 32G X 4 MM MISC 1 each by Does not apply route.     lisinopril  (PRINIVIL ,ZESTRIL ) 5 MG tablet Take 5 mg by mouth daily.     metFORMIN  (GLUCOPHAGE -XR) 500 MG 24 hr tablet Take 500 mg by mouth 2 (two) times daily.     metoCLOPramide  (REGLAN ) 10 MG tablet Take 1 tablet (10 mg total) by mouth every 6 (six) hours. 30 tablet 0   naloxone (NARCAN) nasal spray 4 mg/0.1 mL SMARTSIG:Both Nares     ondansetron  (ZOFRAN -ODT) 4 MG disintegrating tablet Take 4 mg by mouth every 6 (six) hours as needed for vomiting or nausea.     pantoprazole  (PROTONIX ) 40 MG tablet Take 40 mg by mouth 2 (two) times daily.   0   Peppermint Oil (IBGARD) 90 MG CPCR 2 capsules Orally once a day; Duration: 30 days     promethazine  (PHENERGAN ) 12.5 MG tablet 1 tablet as needed Orally every 6 hrs for 30 days     risperiDONE  (RISPERDAL ) 2 MG tablet Take 2 mg by mouth at bedtime.   2   sucralfate  (CARAFATE ) 1 g tablet Take 1 tablet (1 g total) by mouth 4 (four) times daily -  with meals and at bedtime. 120 tablet 0   SYNTHROID  175 MCG tablet Take 175 mcg by mouth daily.     TRADJENTA  5 MG TABS tablet Take 5 mg by mouth.     vancomycin (VANCOCIN) 250 MG capsule Take 250 mg by mouth 4 (four) times daily.     Vitamin D, Ergocalciferol, (DRISDOL) 1.25 MG (50000 UNIT) CAPS capsule Take 50,000 Units by mouth once a week.     amoxicillin (AMOXIL) 875 MG tablet Take 875 mg by mouth 2 (two) times daily.     No current facility-administered medications for this visit.    Allergies  Allergen Reactions   Tape Rash    Paper tape is ok   Wound Dressing  Adhesive Rash   Pholcodine Itching   Semaglutide Nausea And Vomiting   Codeine Itching    Review of Systems  Constitutional:  Positive for fatigue. Negative for activity change and unexpected weight change.  HENT:  Negative for trouble swallowing and voice change.   Respiratory:  Positive for shortness of breath.   Cardiovascular:  Negative for chest pain and leg swelling.  Genitourinary:  Negative for difficulty urinating and dysuria.  Psychiatric/Behavioral:  Positive for dysphoric mood. The patient is nervous/anxious.   All other systems reviewed and are negative.   BP 127/80 (BP Location: Right Arm, Patient Position: Sitting, Cuff Size: Large)   Pulse 66   Ht 5' 3 (1.6 m)   Wt 219 lb 12.8 oz (99.7 kg)   SpO2 96% Comment: RA  BMI 38.94 kg/m  Physical Exam Vitals reviewed.  Constitutional:      General: She is not in acute distress.    Appearance: She is obese.  HENT:     Head: Normocephalic and atraumatic.  Eyes:     General: No scleral icterus.    Extraocular Movements: Extraocular movements intact.  Cardiovascular:     Rate and Rhythm: Normal rate and regular rhythm.     Heart sounds: Normal heart sounds. No murmur heard. Pulmonary:     Effort: Pulmonary effort is normal.     Breath sounds: No wheezing or rales.     Comments: Diminished breath sounds bilaterally Abdominal:  General: There is no distension.     Palpations: Abdomen is soft.  Musculoskeletal:     Cervical back: Neck supple.  Lymphadenopathy:     Cervical: No cervical adenopathy.  Skin:    General: Skin is warm and dry.  Neurological:     General: No focal deficit present.     Mental Status: She is oriented to person, place, and time.     Cranial Nerves: No cranial nerve deficit.     Motor: No weakness.    Diagnostic Tests: CT CHEST WITHOUT CONTRAST   TECHNIQUE: Multidetector CT imaging of the chest was performed following the standard protocol without IV contrast.   RADIATION DOSE  REDUCTION: This exam was performed according to the departmental dose-optimization program which includes automated exposure control, adjustment of the mA and/or kV according to patient size and/or use of iterative reconstruction technique.   COMPARISON:  04/12/2021   FINDINGS: Cardiovascular: No significant vascular findings. Mild aortic and coronary artery calcification. Normal heart size. No pericardial effusion.   Mediastinum/Nodes: No enlarged mediastinal or axillary lymph nodes. Thyroid  gland, trachea, and esophagus demonstrate no significant findings.   Lungs/Pleura: Continued slow interval growth of a solid pulmonary nodule in the perihilar aspect of the left lower lobe measuring 16 x 16 mm (series 8, image 82), previously measured 13 x 13 mm on 04/12/2021. Scattered small calcified granulomas noted bilaterally. Lungs are otherwise clear. No airspace consolidation, pleural effusion, or pneumothorax.   Upper Abdomen: Nodular hepatic surface contour. Splenomegaly. Upper abdominal and paraesophageal varices.   Musculoskeletal: No chest wall mass or suspicious bone lesions identified.   IMPRESSION: 1. Continued slow interval growth of a solid pulmonary nodule in the perihilar aspect of the left lower lobe measuring 16 x 16 mm, previously measured 13 x 13 mm on 04/12/2021. Findings are concerning for a slow-growing neoplasm. Tissue sampling recommended if not previously performed. 2. Cirrhosis with sequela of portal hypertension. 3. Aortic and coronary artery atherosclerosis (ICD10-I70.0).     Electronically Signed   By: Mabel Converse D.O.   On: 03/19/2024 10:23   I personally reviewed the CT images.  Well-circumscribed left lower lobe nodule adjacent to basilar segmental bronchus.  Relatively central in location.  No mediastinal or hilar adenopathy.  Some aortic and mild coronary atherosclerosis.  Cirrhosis.  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  Carcinoid tumor left lower lobe-diagnosed in 2022.  Path report says atypical carcinoid.  Behavior has been benign with very slow growth over the past 3 years and no evidence of spread.  We had a long discussion regarding natural history of carcinoid tumors and their treatment.  She, her wife, and her friend understand that surgical resection is the gold standard of care.  They understand this would require lobectomy which could have a significant impact on her respiratory status.  If she would like we could arrange for consultation with radiation oncology as well to discuss that.  I described the proposed operative procedure to her.  The plan would be to do a robotic assisted left lower lobectomy.  I informed her of the general nature of the procedure including the need for general anesthesia, the incisions to be used, use of drains to postoperatively, the expected hospital stay, and the overall recovery.  I informed her  of the indications, risks, benefits, and alternatives.  She understands the risks include, but not limited to death, MI, DVT, PE, bleeding, possible need for transfusion, infection, prolonged air leak, cardiac arrhythmias, conversion to open procedure, as well as the possibility of other unforeseeable complications.  Given the very slow growth of this nodule there is no urgency for her to make a decision.  She was told to make an appoint with Dr. Levern to rule out a cardiac source for her dyspnea.  I strongly feel that she should do that prior to making a decision as to whether to proceed with surgery.  Plan: She will follow-up with Dr. Levern She will let us  know if she wants to proceed with  surgical resection or would prefer referral for radiation.  [name], MD [facility] [phone]

## 2024-04-16 ENCOUNTER — Ambulatory Visit: Payer: Self-pay

## 2024-04-16 NOTE — Progress Notes (Signed)
 Called pt and advised of lab results per Dr. Pleas. Pt verbalized understanding, NFN.  Pt also states that she is doing much better with her SOB after restarting her Risperdal .

## 2024-04-17 ENCOUNTER — Ambulatory Visit: Admitting: Physical Therapy

## 2024-04-17 ENCOUNTER — Telehealth: Payer: Self-pay | Admitting: Physician Assistant

## 2024-04-17 NOTE — Telephone Encounter (Signed)
 Called and left a voicemail for the patient with new appointment time since the appointment needs to be a shared visit to go over biopsy results.

## 2024-04-18 ENCOUNTER — Ambulatory Visit (HOSPITAL_COMMUNITY)

## 2024-04-21 ENCOUNTER — Encounter: Payer: Self-pay | Admitting: Radiology

## 2024-05-05 NOTE — Progress Notes (Unsigned)
 [facility] OFFICE PROGRESS NOTE  Rhonda Francisco, MD [address] [place] [address]  DIAGNOSIS: stage Ia (T1b, N0, M0) neuroendocrine carcinoma, atypical carcinoid that was initially diagnosed in October 2022 involving left lower lobe lung nodule that has been slowly increasing in size over the last 3 years.   PRIOR THERAPY: none   CURRENT THERAPY: ***  INTERVAL HISTORY: Rhonda Higgins 53 y.o. female returns to the clinic today for follow-up visit.  The patient was last seen in the clinic by Dr. Sherrod on 03/20/2024.  The patient has stage I neuroendocrine carcinoma that is slow-growing.  She saw Dr. Kerrin on 04/15/2024.  Dr. Kerrin did offer her left lower lobectomy.  She opted to follow-up with her PCP Dr. Levern and would let Dr. Kerrin know if she was interested in surgery versus radiation.  She has since decided that***.   She sees pulmonary medicine for shortness of breath.  He had PFTs performed.  She states that her shortness of breath is ***.  She denies any fever, chills, night sweats, or unexplained weight loss.  Denies any chest pain, hemoptysis, or cough.  Denies any nausea, vomiting, diarrhea, or constipation.  Denies any headache or visual changes.  She denies any flushing.  She denies any wheezing.  She is here today for evaluation and follow-up visit.  MEDICAL HISTORY: Past Medical History:  Diagnosis Date   Adenocarcinoma (HCC)    endometrial, FIGO GRADE 1   Allergic rhinitis    Arthritis    Atypical chest pain    History of   Borderline personality disorder (HCC)    Cirrhosis (HCC)    Depression    Elevated liver enzymes    GERD (gastroesophageal reflux disease)    Hematuria    History of endometrial cancer 08-02-2009   oncologist-  dr brewster/ eloy and dr kinard/  no recurrence   endometrial adenocarinoma Stage 1B, Grade 1, FIGO--  s/p  TAH w/ BSO and pelvic lymph node dissection's and radiation therapy   History of  kidney stones    History of radiation therapy    2011  pelvic intracavity brachytherapy treatment's for endometrial carcinoma   History of thyroid  nodule    multinodular goiter s/p  total thyroidectomy 11-19-2015  per pathology -  adenomatoid nodules   Hyperlipidemia    Hypothyroidism, postsurgical    Insulin  dependent diabetes mellitus    Type 2   Left ureteral stone    Obesity    OSA (obstructive sleep apnea)    sleep study 2017 showed OSA negative (pt had OSA in 2011, and lost weight, no issues since)   Overactive bladder    Personality disorder (HCC)    Polyphagia(783.6)    PONV (postoperative nausea and vomiting)    after ear surgery only one time   Right lower quadrant pain    Umbilical hernia    Urgency of urination    UTI (urinary tract infection)     ALLERGIES:  is allergic to tape, wound dressing adhesive, pholcodine, semaglutide, and codeine.  MEDICATIONS:  Current Outpatient Medications  Medication Sig Dispense Refill   ACCU-CHEK GUIDE test strip      Accu-Chek Softclix Lancets lancets daily.     amoxicillin (AMOXIL) 875 MG tablet Take 875 mg by mouth 2 (two) times daily.     atorvastatin  (LIPITOR) 40 MG tablet Take 40 mg by mouth daily.     dicyclomine (BENTYL) 20 MG tablet Take 20 mg by mouth 4 (four)  times daily as needed for spasms.     ezetimibe (ZETIA) 10 MG tablet Take 10 mg by mouth daily.     FLUoxetine (PROZAC) 20 MG capsule Take 20 mg by mouth daily.     fluticasone-salmeterol (ADVAIR HFA) 230-21 MCG/ACT inhaler Inhale 2 puffs into the lungs 2 (two) times daily. 12 g 6   gabapentin  (NEURONTIN ) 400 MG capsule Take 400-1,200 mg by mouth See admin instructions. Takes 400 mg in the morning 400 mg in the afternoon and 1200 mg at night     HYDROcodone -acetaminophen  (NORCO/VICODIN) 5-325 MG tablet Take 1 tablet by mouth every 12 (twelve) hours.     hydrOXYzine  (ATARAX ) 25 MG tablet Take 25 mg by mouth 3 (three) times daily as needed for anxiety.     insulin   glargine (LANTUS ) 100 UNIT/ML injection Inject 18 Units into the skin at bedtime.     Insulin  Pen Needle 32G X 4 MM MISC 1 each by Does not apply route.     lisinopril  (PRINIVIL ,ZESTRIL ) 5 MG tablet Take 5 mg by mouth daily.     metFORMIN  (GLUCOPHAGE -XR) 500 MG 24 hr tablet Take 500 mg by mouth 2 (two) times daily.     metoCLOPramide  (REGLAN ) 10 MG tablet Take 1 tablet (10 mg total) by mouth every 6 (six) hours. 30 tablet 0   naloxone (NARCAN) nasal spray 4 mg/0.1 mL SMARTSIG:Both Nares     ondansetron  (ZOFRAN -ODT) 4 MG disintegrating tablet Take 4 mg by mouth every 6 (six) hours as needed for vomiting or nausea.     pantoprazole  (PROTONIX ) 40 MG tablet Take 40 mg by mouth 2 (two) times daily.   0   Peppermint Oil (IBGARD) 90 MG CPCR 2 capsules Orally once a day; Duration: 30 days     promethazine  (PHENERGAN ) 12.5 MG tablet 1 tablet as needed Orally every 6 hrs for 30 days     risperiDONE  (RISPERDAL ) 2 MG tablet Take 2 mg by mouth at bedtime.   2   sucralfate  (CARAFATE ) 1 g tablet Take 1 tablet (1 g total) by mouth 4 (four) times daily -  with meals and at bedtime. 120 tablet 0   SYNTHROID  175 MCG tablet Take 175 mcg by mouth daily.     TRADJENTA  5 MG TABS tablet Take 5 mg by mouth.     vancomycin (VANCOCIN) 250 MG capsule Take 250 mg by mouth 4 (four) times daily.     Vitamin D, Ergocalciferol, (DRISDOL) 1.25 MG (50000 UNIT) CAPS capsule Take 50,000 Units by mouth once a week.     No current facility-administered medications for this visit.    SURGICAL HISTORY:  Past Surgical History:  Procedure Laterality Date   ANTERIOR CERVICAL DECOMP/DISCECTOMY FUSION N/A 01/26/2023   Procedure: C5-6, C6-7 ANTERIOR CERVICAL DECOMPRESSION/DISCECTOMY FUSION 2 LEVELS;  Surgeon: Georgina Ozell LABOR, MD;  Location: [facility];  Service: Orthopedics;  Laterality: N/A;   BIOPSY  04/25/2022   Procedure: BIOPSY;  Surgeon: Saintclair Jasper, MD;  Location: [facility];  Service: Gastroenterology;;   BIOPSY  04/10/2023    Procedure: BIOPSY;  Surgeon: Saintclair Jasper, MD;  Location: [facility];  Service: Gastroenterology;;   BRONCHIAL BIOPSY  04/18/2021   Procedure: BRONCHIAL BIOPSIES;  Surgeon: Shelah Lamar RAMAN, MD;  Location: [facility] ENDOSCOPY;  Service: Pulmonary;;   BRONCHIAL BRUSHINGS  04/18/2021   Procedure: BRONCHIAL BRUSHINGS;  Surgeon: Shelah Lamar RAMAN, MD;  Location: [facility] ENDOSCOPY;  Service: Pulmonary;;   BRONCHIAL NEEDLE ASPIRATION BIOPSY  04/18/2021   Procedure: BRONCHIAL NEEDLE ASPIRATION BIOPSIES;  Surgeon: Shelah Lamar RAMAN, MD;  Location: [facility] ENDOSCOPY;  Service: Pulmonary;;   CARDIOVASCULAR STRESS TEST  06/09/2008   normal nuclear study w/ no ischemia/  normal LV function and wall motion , ef 83%   CHOLECYSTECTOMY N/A 09/06/2017   Procedure: LAPAROSCOPIC CHOLECYSTECTOMY WITH INTRAOPERATIVE CHOLANGIOGRAM;  Surgeon: Eletha Boas, MD;  Location: [facility];  Service: General;  Laterality: N/A;   COLONOSCOPY WITH PROPOFOL  N/A 04/25/2022   Procedure: COLONOSCOPY WITH PROPOFOL ;  Surgeon: Saintclair Jasper, MD;  Location: [facility];  Service: Gastroenterology;  Laterality: N/A;   CYSTOSCOPY/RETROGRADE/URETEROSCOPY/STONE EXTRACTION WITH BASKET Left 03/08/2016   Procedure: CYSTOSCOPY/RETROGRADE/URETEROSCOPY/STONE EXTRACTION WITH BASKET, STENT PLACEMENT;  Surgeon: Alm Fragmin, MD;  Location: [facility];  Service: Urology;  Laterality: Left;   CYSTOSCOPY/URETEROSCOPY/HOLMIUM LASER/STENT PLACEMENT Left 07/27/2023   Procedure: CYSTOSCOPYLEFT/URETEROSCOPY/HOLMIUM LASER/STENT PLACEMENT;  Surgeon: Carolee Sherwood JONETTA DOUGLAS, MD;  Location: [facility];  Service: Urology;  Laterality: Left;   ENDOMETRIAL BIOPSY     ERCP N/A 09/07/2017   Procedure: ENDOSCOPIC RETROGRADE CHOLANGIOPANCREATOGRAPHY (ERCP);  Surgeon: Saintclair Jasper, MD;  Location: [facility];  Service: Gastroenterology;  Laterality: N/A;   ESOPHAGEAL BANDING  04/10/2023   Procedure: ESOPHAGEAL BANDING;  Surgeon: Saintclair Jasper, MD;  Location: [facility];  Service:  Gastroenterology;;   ESOPHAGOGASTRODUODENOSCOPY N/A 04/25/2022   Procedure: ESOPHAGOGASTRODUODENOSCOPY (EGD);  Surgeon: Saintclair Jasper, MD;  Location: [facility];  Service: Gastroenterology;  Laterality: N/A;   ESOPHAGOGASTRODUODENOSCOPY N/A 04/10/2023   Procedure: ESOPHAGOGASTRODUODENOSCOPY (EGD);  Surgeon: Saintclair Jasper, MD;  Location: [facility];  Service: Gastroenterology;  Laterality: N/A;  with possible banding for esophageal varices   ESOPHAGOGASTRODUODENOSCOPY N/A 11/20/2023   Procedure: EGD (ESOPHAGOGASTRODUODENOSCOPY);  Surgeon: Saintclair Jasper, MD;  Location: [facility];  Service: Gastroenterology;  Laterality: N/A;   ESOPHAGOGASTRODUODENOSCOPY (EGD) WITH PROPOFOL  N/A 03/16/2020   Procedure: ESOPHAGOGASTRODUODENOSCOPY (EGD) WITH PROPOFOL ;  Surgeon: Saintclair Jasper, MD;  Location: [facility];  Service: Gastroenterology;  Laterality: N/A;  unable to locate ampulla, aborted ERCP, changed to EGD   HEMOSTASIS CLIP PLACEMENT  04/25/2022   Procedure: HEMOSTASIS CLIP PLACEMENT;  Surgeon: Saintclair Jasper, MD;  Location: [facility];  Service: Gastroenterology;;   HEMOSTASIS CONTROL  04/25/2022   Procedure: HEMOSTASIS CONTROL;  Surgeon: Saintclair Jasper, MD;  Location: [facility];  Service: Gastroenterology;;   HOLMIUM LASER APPLICATION Left 03/08/2016   Procedure: HOLMIUM LASER APPLICATION;  Surgeon: Alm Fragmin, MD;  Location: [facility];  Service: Urology;  Laterality: Left;   MOUTH SURGERY     MYRINGECOTMY W/ REMOVAL MIDDLE EAR CHOLESTEATOMA TYPE 1 FASICA TYMPANOPLASTY  09/13/2000   POLYPECTOMY  04/25/2022   Procedure: POLYPECTOMY;  Surgeon: Saintclair Jasper, MD;  Location: [facility];  Service: Gastroenterology;;   ROBOTIC ASSISTED TOTAL HYSTERECTOMY WITH BILATERAL SALPINGO OOPHERECTOMY  08-02-2009   at [facility]  dr eloy   w/  Bilateral pelvic and para aortic lymph node dissection's   THYROIDECTOMY N/A 11/19/2015   Procedure: TOTAL THYROIDECTOMY;  Surgeon: Boas Eletha, MD;  Location: [facility];   Service: General;  Laterality: N/A;   TONSILLECTOMY  age 1   TRANSTHORACIC ECHOCARDIOGRAM  07/19/2014   ef 55-60%/  trivial TR   TYMPANOPLASTY Right 1993   VIDEO BRONCHOSCOPY WITH ENDOBRONCHIAL NAVIGATION Left 04/18/2021   Procedure: ROBOTIC ASSISTED BRONCHOSCOPY WITH ENDOBRONCHIAL NAVIGATION;  Surgeon: Shelah Lamar RAMAN, MD;  Location: [facility];  Service: Pulmonary;  Laterality: Left;   VIDEO BRONCHOSCOPY WITH RADIAL ENDOBRONCHIAL ULTRASOUND  04/18/2021   Procedure: RADIAL ENDOBRONCHIAL ULTRASOUND;  Surgeon: Shelah Lamar RAMAN, MD;  Location: [facility];  Service: Pulmonary;;  REVIEW OF SYSTEMS:   Review of Systems  Constitutional: Negative for appetite change, chills, fatigue, fever and unexpected weight change.  HENT:   Negative for mouth sores, nosebleeds, sore throat and trouble swallowing.   Eyes: Negative for eye problems and icterus.  Respiratory: Negative for cough, hemoptysis, shortness of breath and wheezing.   Cardiovascular: Negative for chest pain and leg swelling.  Gastrointestinal: Negative for abdominal pain, constipation, diarrhea, nausea and vomiting.  Genitourinary: Negative for bladder incontinence, difficulty urinating, dysuria, frequency and hematuria.   Musculoskeletal: Negative for back pain, gait problem, neck pain and neck stiffness.  Skin: Negative for itching and rash.  Neurological: Negative for dizziness, extremity weakness, gait problem, headaches, light-headedness and seizures.  Hematological: Negative for adenopathy. Does not bruise/bleed easily.  Psychiatric/Behavioral: Negative for confusion, depression and sleep disturbance. The patient is not nervous/anxious.     PHYSICAL EXAMINATION:  There were no vitals taken for this visit.  ECOG PERFORMANCE STATUS: {CHL ONC ECOG [id number]  Physical Exam  Constitutional: Oriented to person, place, and time and well-developed, well-nourished, and in no distress. No distress.  HENT:  Head:  Normocephalic and atraumatic.  Mouth/Throat: Oropharynx is clear and moist. No oropharyngeal exudate.  Eyes: Conjunctivae are normal. Right eye exhibits no discharge. Left eye exhibits no discharge. No scleral icterus.  Neck: Normal range of motion. Neck supple.  Cardiovascular: Normal rate, regular rhythm, normal heart sounds and intact distal pulses.   Pulmonary/Chest: Effort normal and breath sounds normal. No respiratory distress. No wheezes. No rales.  Abdominal: Soft. Bowel sounds are normal. Exhibits no distension and no mass. There is no tenderness.  Musculoskeletal: Normal range of motion. Exhibits no edema.  Lymphadenopathy:    No cervical adenopathy.  Neurological: Alert and oriented to person, place, and time. Exhibits normal muscle tone. Gait normal. Coordination normal.  Skin: Skin is warm and dry. No rash noted. Not diaphoretic. No erythema. No pallor.  Psychiatric: Mood, memory and judgment normal.  Vitals reviewed.  LABORATORY DATA: Lab Results  Component Value Date   WBC 3.5 (L) 04/14/2024   HGB 11.4 (L) 04/14/2024   HCT 36.2 04/14/2024   MCV 77.9 (L) 04/14/2024   PLT 97.0 (L) 04/14/2024      Chemistry      Component Value Date/Time   NA 141 03/20/2024 1512   K 3.5 03/20/2024 1512   CL 107 03/20/2024 1512   CO2 29 03/20/2024 1512   BUN 7 03/20/2024 1512   CREATININE 0.90 03/20/2024 1512      Component Value Date/Time   CALCIUM  8.2 (L) 03/20/2024 1512   ALKPHOS 64 03/20/2024 1512   AST 24 03/20/2024 1512   ALT 15 03/20/2024 1512   BILITOT 0.8 03/20/2024 1512       RADIOGRAPHIC STUDIES:  DG Ankle Complete Left Result Date: 04/10/2024 Please see detailed radiograph report in office note.    ASSESSMENT/PLAN:  This is a very pleasant 53 year old Caucasian female never smoker with a stage Ia (T1b, N0, M0) neuroendocrine carcinoma, atypical carcinoid that was initially diagnosed in October 2022 involving left lower lobe lung nodule that has been slowly  increasing in size over the last 3 years.  Patient establish care with Dr. Sherrod in the clinic on 03/20/2024.  He discussed surgical resection versus SBRT if she is not a good surgical candidate.  It would be ideal to undergo surgical intervention for curative outcome.  He did see Dr. Kerrin on 04/15/2024.  At that time the patient opted to weigh  her options before making a decision.  Since being seen she decided ***  Patient was seen by Dr. Sherrod today.  I will facilitate ***.  I will arrange for repeat CT scan the chest in approximately ***months.  We will then see her back approximately 1 week after the scan to review the results in the office.  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  The patient was advised to call immediately if she has any concerning symptoms in the interval. The patient voices understanding of current disease status and treatment options and is in agreement with the current care plan. All questions were answered. The patient knows to call the clinic with any problems, questions or concerns. We can certainly see the patient much sooner if necessary     No orders of the defined types were placed in this encounter.    I spent {CHL ONC TIME VISIT - DTPQU:[id number]} counseling the patient face to face. The total time spent in the appointment was {CHL ONC TIME VISIT - DTPQU:[id number]}.  Rhonda Hillebrand L Zakaria Sedor, PA-C 05/05/24

## 2024-05-07 NOTE — Progress Notes (Signed)
 History of Present Illness This is a 53 y.o. female who returns for follow-up of type 2 diabetes.  Rhonda Higgins's pulmonary atypical carcinoid tumor has grown in size and she may need surgery in the near future.  She was started on Tradjenta  in August which she is tolerating.  Most blood sugars when checked are in the low to high 100s although she did get a reading in the high-300s after steroid injection last month.  Hgb A1C today is 7.4%.  No past medical history on file. No past surgical history on file. Allergies[1] Medications Ordered Prior to Encounter[2] No family history on file. Social History[3]  Review of Systems Eight systems were reviewed; pertinent positives and negatives are as mentioned in the HPI.    Physical Examination Vitals:   05/07/24 0934  BP: 130/72  Weight: 220 lb (99.8 kg)  Height: 5' 3 (1.6 m)   Body mass index is 38.97 kg/m. GENERAL:  Pleasant, well-appearing female in no distress. HEENT: Pupils equal, round and reactive to light. Extraocular movements intact. Nondilated fundoscopic exam reveals no signs of retinopathy. Mucous membranes moist with no oropharyngeal lesions. NECK: Supple with no lymphadenopathy.  Thyroid  absent. CARDIOVASCULAR: Normal rate and regular rhythm with no murmurs, rubs or gallops; 2+ distal pulses. EXTREMITIES: Warm and well-perfused.  No clubbing, cyanosis or edema. NEUROLOGIC: Alert and oriented x3. Gait and station normal. SKIN: Warm and dry, with no rashes. No acanthosis nigricans. Diabetic foot exam:  Left: Monofilament test: Sensation abnormal  Pulses: normal and present  Skin: Normal and no erythema, no cyanosis or pallor   Other findings: onychomycosis Right: Monofilament test: Sensation abnormal  Pulses: normal and present  Skin: Normal and no erythema, no cyanosis or pallor   Other findings: onychomycosis Exam performed with shoes and socks removed.   Recent Results (from the past week)  POCT A1C   Collection Time:  05/07/24  9:37 AM  Result Value Ref Range   Hemoglobin A1c 7.4 (A) 4.8 - 5.6 %    A/P:   1.  Type 2 Diabetes w/neuropathy:   Hgb A1C up to 7.4%.  Rhonda Higgins's pulmonary atypical carcinoid tumor has grown in size and she may need surgery in the near future.  She was started on Tradjenta  in August which she is tolerating.  Most blood sugars when checked are in the low to high 100s although she did get a reading in the high-300s after steroid injection last month.  Rhonda Higgins's rise in A1C level was likely from her glucocorticoid injection.  She will continue taking Toujeo  Solostar 18 units QHS, Metformin  ER 500mg  #2 tablets BID and Tradjenta  5mg  daily.  She will also start using Freestyle Libre 3+ sensors.  Otherwise finger stick glucoses should be checked 2-3 times a day.  Will request lab results from PCP.  2.  Post-Surgical Hypothyroidism:  On Levothyroxine .  Will request recent labs results from Rhonda Higgins's PCP.  3.  Hyperlipidemia:  Continue Atorvastatin  40mg  daily.  Will request lab results from PCP.  Follow-up in 3 months.     Rhonda FORBES Lipps, MD 05/07/2024, 10:00 AM        [1] Allergies Allergen Reactions  . Codeine Itching  . Contact Dermatitis Rash    Paper tape is ok  . Hydrocodone -Acetaminophen  Itching  . Other Itching  . Pedi-Pre Tape Spray [Wound Dressing Adhesive] Rash  . Pholcodine Itching  . Ozempic (0.25 Or 0.5 Mg-Dose) [Semaglutide] Nausea And Vomiting  [2] Current Outpatient Medications on File Prior to Visit  Medication Sig  Dispense Refill  . ACCU-CHEK FASTCLIX LANCETS MISC Use to monitor blood glucose 1 time(s) daily 100 each 1  . Ascorbic Acid (VITAMIN C) 100 MG tablet Vitamin C    . atorvastatin  (LIPITOR) 20 mg tablet Take two tablets (40 mg dose) by mouth daily.    . buPROPion  HCl (WELLBUTRIN  XL) 150 mg 24 hr tablet     . buPROPion  hcl (WELLBUTRIN  XL) 300 mg 24 hr tablet 1 tablet in the morning    . Cetirizine HCl (ZYRTEC ALLERGY) 10 MG CAPS Zyrtec 10 mg  capsule    . Cholecalciferol 25 MCG (1000 UT) tablet 1 tablet    . Cyanocobalamin 2500 MCG SUBL Place 2,500 mcg under the tongue daily.    . Ferrous Sulfate Dried (FERROUS SULFATE IRON  PO) Take 45 mg by mouth daily.    . gabapentin  (NEURONTIN ) 400 mg capsule gabapentin  400 mg capsule  TAKE 1 CAPSULE BY MOUTH 2 TIMES A DAY, AND 3 CAPSULES AT BEDTIME    . glucose blood (ACCU-CHEK GUIDE) test strip USE AS DIRECTED ONCE DAILY 100 each 3  . hydrOXYzine  HCl (ATARAX ) 25 mg tablet TAKE 1 TABLET 3 TIMES A DAY FOR ANXIETY,PANIC, AGITATION, AND SLEEP    . ibuprofen  (ADVIL ,MOTRIN ) 200 mg tablet Take by mouth.    . Insulin  Glargine (LANTUS  SOLOSTAR) 100 UNIT/ML SOPN Inject eighteen Units into the skin at bedtime. 30 mL 3  . Insulin  Pen Needle (BD,SURE COMFORT,NOVOFINE) 32G X 4 MM MISC one each by Does not apply route daily. 100 each 3  . lamoTRIgine  (LAMICTAL ) 200 MG tablet lamotrigine  200 mg tablet  TAKE 1 TABLET BY MOUTH EVERY DAY IN THE MORNING    . levothyroxine  sodium (SYNTHROID ,LEVOTHROID,LEVOXYL ) 175 mcg tablet Take one tablet (175 mcg dose) by mouth daily.    . linaGLIPtin  (TRADJENTA ) 5 mg tablet Take one tablet (5 mg dose) by mouth with breakfast. 90 tablet 3  . lisinopril  (PRINIVIL ,ZESTRIL ) 5 mg tablet lisinopril  5 mg tablet  TAKE 1 TABLET BY MOUTH EVERY DAY    . metFORMIN  ER (GLUCOPHAGE -XR) 500 mg 24 hr tablet Take two tablets (1,000 mg dose) by mouth 2 (two) times daily with meals. 360 tablet 3  . ondansetron  (ZOFRAN -ODT) 4 mg disintegrating tablet Take one tablet (4 mg dose) by mouth 2 (two) times a day as needed.    . pantoprazole  sodium (PROTONIX ) 40 mg tablet pantoprazole  40 mg tablet,delayed release  TAKE 1 TABLET BY MOUTH EVERY DAY    . promethazine  (PHENERGAN ) 12.5 MG tablet Take one tablet (12.5 mg dose) by mouth.    . risperiDONE  (RISPERDAL ) 2 MG tablet risperidone  2 mg tablet  TAKE 1 TABLET BY MOUTH EVERYDAY AT BEDTIME    . traZODone  (DESYREL ) 50 MG tablet Take one tablet (50 mg  dose) by mouth.     No current facility-administered medications on file prior to visit.  [3] Social History Socioeconomic History  . Marital status: Married  Tobacco Use  . Smoking status: Never    Passive exposure: Never  . Smokeless tobacco: Never  Substance and Sexual Activity  . Alcohol use: Not Currently  . Drug use: Never

## 2024-05-08 ENCOUNTER — Other Ambulatory Visit: Payer: Self-pay | Admitting: *Deleted

## 2024-05-08 ENCOUNTER — Other Ambulatory Visit

## 2024-05-08 ENCOUNTER — Inpatient Hospital Stay (HOSPITAL_BASED_OUTPATIENT_CLINIC_OR_DEPARTMENT_OTHER): Admitting: Physician Assistant

## 2024-05-08 ENCOUNTER — Encounter: Payer: Self-pay | Admitting: *Deleted

## 2024-05-08 ENCOUNTER — Ambulatory Visit: Admitting: Physician Assistant

## 2024-05-08 ENCOUNTER — Inpatient Hospital Stay: Attending: Internal Medicine

## 2024-05-08 VITALS — BP 138/60 | HR 81 | Temp 97.8°F | Resp 17 | Wt 221.0 lb

## 2024-05-08 DIAGNOSIS — C7A8 Other malignant neuroendocrine tumors: Secondary | ICD-10-CM

## 2024-05-08 DIAGNOSIS — D3A090 Benign carcinoid tumor of the bronchus and lung: Secondary | ICD-10-CM

## 2024-05-08 DIAGNOSIS — D500 Iron deficiency anemia secondary to blood loss (chronic): Secondary | ICD-10-CM

## 2024-05-08 DIAGNOSIS — D696 Thrombocytopenia, unspecified: Secondary | ICD-10-CM

## 2024-05-08 LAB — CBC WITH DIFFERENTIAL (CANCER CENTER ONLY)
Abs Immature Granulocytes: 0 K/uL (ref 0.00–0.07)
Basophils Absolute: 0 K/uL (ref 0.0–0.1)
Basophils Relative: 0 %
Eosinophils Absolute: 0.1 K/uL (ref 0.0–0.5)
Eosinophils Relative: 2 %
HCT: 36.3 % (ref 36.0–46.0)
Hemoglobin: 11.3 g/dL — ABNORMAL LOW (ref 12.0–15.0)
Immature Granulocytes: 0 %
Lymphocytes Relative: 30 %
Lymphs Abs: 0.8 K/uL (ref 0.7–4.0)
MCH: 24 pg — ABNORMAL LOW (ref 26.0–34.0)
MCHC: 31.1 g/dL (ref 30.0–36.0)
MCV: 77.2 fL — ABNORMAL LOW (ref 80.0–100.0)
Monocytes Absolute: 0.3 K/uL (ref 0.1–1.0)
Monocytes Relative: 9 %
Neutro Abs: 1.5 K/uL — ABNORMAL LOW (ref 1.7–7.7)
Neutrophils Relative %: 59 %
Platelet Count: 80 K/uL — ABNORMAL LOW (ref 150–400)
RBC: 4.7 MIL/uL (ref 3.87–5.11)
RDW: 15.6 % — ABNORMAL HIGH (ref 11.5–15.5)
WBC Count: 2.7 K/uL — ABNORMAL LOW (ref 4.0–10.5)
nRBC: 0 % (ref 0.0–0.2)

## 2024-05-08 LAB — CMP (CANCER CENTER ONLY)
ALT: 16 U/L (ref 0–44)
AST: 39 U/L (ref 15–41)
Albumin: 3.6 g/dL (ref 3.5–5.0)
Alkaline Phosphatase: 88 U/L (ref 38–126)
Anion gap: 9 (ref 5–15)
BUN: 6 mg/dL (ref 6–20)
CO2: 28 mmol/L (ref 22–32)
Calcium: 8.6 mg/dL — ABNORMAL LOW (ref 8.9–10.3)
Chloride: 105 mmol/L (ref 98–111)
Creatinine: 0.77 mg/dL (ref 0.44–1.00)
GFR, Estimated: >60 mL/min (ref >60)
Glucose, Bld: 152 mg/dL — ABNORMAL HIGH (ref 70–99)
Potassium: 3.8 mmol/L (ref 3.5–5.1)
Sodium: 142 mmol/L (ref 135–145)
Total Bilirubin: 0.8 mg/dL (ref 0.0–1.2)
Total Protein: 6.1 g/dL — ABNORMAL LOW (ref 6.5–8.1)

## 2024-05-08 LAB — FERRITIN: Ferritin: 22 ng/mL (ref 11–307)

## 2024-05-08 LAB — IRON AND IRON BINDING CAPACITY (CC-WL,HP ONLY)
Iron: 41 ug/dL (ref 28–170)
Saturation Ratios: 10 % — ABNORMAL LOW (ref 10.4–31.8)
TIBC: 407 ug/dL (ref 250–450)
UIBC: 366 ug/dL

## 2024-05-13 ENCOUNTER — Ambulatory Visit: Admitting: Podiatry

## 2024-05-21 NOTE — Progress Notes (Addendum)
 Surgical Instructions   Your procedure is scheduled on May 26, 2024 Report to [facility] Main Entrance A at 8:30 A.M., then check in with the Admitting office. Any questions or running late day of surgery: call [phone]  Questions prior to your surgery date: call [phone], Monday-Friday, 8am-4pm. If you experience any cold or flu symptoms such as cough, fever, chills, shortness of breath, etc. between now and your scheduled surgery, please notify us  at the above number.     Remember:  Do not eat or drink after midnight the night before your surgery  Take these medicines the morning of surgery with A SIP OF WATER   atorvastatin  (LIPITOR)  ezetimibe (ZETIA)  FLUoxetine (PROZAC)  ADVAIR  HFA  gabapentin  (NEURONTIN )  HYDROcodone -acetaminophen  (NORCO/VICODIN)  pantoprazole  (PROTONIX )  SYNTHROID    May take these medicines IF NEEDED: dicyclomine (BENTYL)  hydrOXYzine  (ATARAX )  metoCLOPramide  (REGLAN )  naloxone (NARCAN) nasal spray  ondansetron  (ZOFRAN -ODT)  promethazine  (PHENERGAN )   WHAT DO I DO ABOUT MY DIABETES MEDICATION?   Do not take oral diabetes medicines linagliptin  (TRADJENTA ), or metFORMIN  (GLUCOPHAGE -XR)  the morning of surgery.  metFORMIN  (GLUCOPHAGE -XR)LAST DOSE 05-23-24  THE NIGHT BEFORE SURGERY, take 9 units of insulin  glargine (LANTUS )  insulin .    .  The day of surgery, do not take other diabetes injectables, including Byetta (exenatide), Bydureon (exenatide ER), Victoza  (liraglutide ), or Trulicity (dulaglutide).  If your CBG is greater than 220 mg/dL, you may take  of your sliding scale (correction) dose of insulin .   HOW TO MANAGE YOUR DIABETES BEFORE AND AFTER SURGERY  Why is it important to control my blood sugar before and after surgery? Improving blood sugar levels before and after surgery helps healing and can limit problems. A way of improving blood sugar control is eating a healthy diet by:  Eating less sugar and carbohydrates   Increasing activity/exercise  Talking with your doctor about reaching your blood sugar goals High blood sugars (greater than 180 mg/dL) can raise your risk of infections and slow your recovery, so you will need to focus on controlling your diabetes during the weeks before surgery. Make sure that the doctor who takes care of your diabetes knows about your planned surgery including the date and location.  How do I manage my blood sugar before surgery? Check your blood sugar at least 4 times a day, starting 2 days before surgery, to make sure that the level is not too high or low.  Check your blood sugar the morning of your surgery when you wake up and every 2 hours until you get to the Short Stay unit.  If your blood sugar is less than 70 mg/dL, you will need to treat for low blood sugar: Do not take insulin . Treat a low blood sugar (less than 70 mg/dL) with  cup of clear juice (cranberry or apple), 4 glucose tablets, OR glucose gel. Recheck blood sugar in 15 minutes after treatment (to make sure it is greater than 70 mg/dL). If your blood sugar is not greater than 70 mg/dL on recheck, call [phone] for further instructions. Report your blood sugar to the short stay nurse when you get to Short Stay.  If you are admitted to the hospital after surgery: Your blood sugar will be checked by the staff and you will probably be given insulin  after surgery (instead of oral diabetes medicines) to make sure you have good blood sugar levels. The goal for blood sugar control after surgery is 80-180 mg/dL.   One week prior to surgery,  STOP taking any Aspirin  (unless otherwise instructed by your surgeon) Aleve , Naproxen , Ibuprofen , Motrin , Advil , Goody's, BC's, all herbal medications, fish oil, and non-prescription vitamins.                     Do NOT Smoke (Tobacco/Vaping) for 24 hours prior to your procedure.  If you use a CPAP at night, you may bring your mask/headgear for your overnight stay.    You will be asked to remove any contacts, glasses, piercing's, hearing aid's, dentures/partials prior to surgery. Please bring cases for these items if needed.    Patients discharged the day of surgery will not be allowed to drive home, and someone needs to stay with them for 24 hours.  SURGICAL WAITING ROOM VISITATION Patients may have no more than 2 support people in the waiting area - these visitors may rotate.   Pre-op nurse will coordinate an appropriate time for 1 ADULT support person, who may not rotate, to accompany patient in pre-op.  Children under the age of 31 must have an adult with them who is not the patient and must remain in the main waiting area with an adult.  If the patient needs to stay at the hospital during part of their recovery, the visitor guidelines for inpatient rooms apply.  Please refer to the [facility] website for the visitor guidelines for any additional information.   If you received a COVID test during your pre-op visit  it is requested that you wear a mask when out in public, stay away from anyone that may not be feeling well and notify your surgeon if you develop symptoms. If you have been in contact with anyone that has tested positive in the last 10 days please notify you surgeon.      Pre-operative CHG Bathing Instructions   You can play a key role in reducing the risk of infection after surgery. Your skin needs to be as free of germs as possible. You can reduce the number of germs on your skin by washing with CHG (chlorhexidine  gluconate) soap before surgery. CHG is an antiseptic soap that kills germs and continues to kill germs even after washing.   DO NOT use if you have an allergy to chlorhexidine /CHG or antibacterial soaps. If your skin becomes reddened or irritated, stop using the CHG and notify one of our RNs at [phone].              TAKE A SHOWER THE NIGHT BEFORE SURGERY   Please keep in mind the following:  DO NOT shave, including  legs and underarms, 48 hours prior to surgery.   You may shave your face before/day of surgery.  Place clean sheets on your bed the night before surgery Use a clean washcloth (not used since being washed) for shower. DO NOT sleep with pet's night before surgery.  CHG Shower Instructions:  Wash your face and private area with normal soap. If you choose to wash your hair, wash first with your normal shampoo.  After you use shampoo/soap, rinse your hair and body thoroughly to remove shampoo/soap residue.  Turn the water  OFF and apply half the bottle of CHG soap to a CLEAN washcloth.  Apply CHG soap ONLY FROM YOUR NECK DOWN TO YOUR TOES (washing for 3-5 minutes)  DO NOT use CHG soap on face, private areas, open wounds, or sores.  Pay special attention to the area where your surgery is being performed.  If you are having back surgery, having  someone wash your back for you may be helpful. Wait 2 minutes after CHG soap is applied, then you may rinse off the CHG soap.  Pat dry with a clean towel  Put on clean pajamas    Additional instructions for the day of surgery: If you choose, you may shower the morning of surgery with an antibacterial soap.  DO NOT APPLY any lotions, deodorants, cologne, or perfumes.   Do not wear jewelry or makeup Do not wear nail polish, gel polish, artificial nails, or any other type of covering on natural nails (fingers and toes) Do not bring valuables to the hospital. [facility] is not responsible for valuables/personal belongings. Put on clean/comfortable clothes.  Please brush your teeth.  Ask your nurse before applying any prescription medications to the skin.

## 2024-05-22 ENCOUNTER — Encounter: Payer: Self-pay | Admitting: Cardiology

## 2024-05-22 ENCOUNTER — Other Ambulatory Visit: Payer: Self-pay

## 2024-05-22 ENCOUNTER — Ambulatory Visit (HOSPITAL_COMMUNITY)
Admission: RE | Admit: 2024-05-22 | Discharge: 2024-05-22 | Disposition: A | Source: Ambulatory Visit | Attending: Thoracic Surgery (Cardiothoracic Vascular Surgery) | Admitting: Thoracic Surgery (Cardiothoracic Vascular Surgery)

## 2024-05-22 ENCOUNTER — Inpatient Hospital Stay (HOSPITAL_COMMUNITY)
Admission: RE | Admit: 2024-05-22 | Discharge: 2024-05-22 | Disposition: A | Source: Ambulatory Visit | Attending: Thoracic Surgery (Cardiothoracic Vascular Surgery)

## 2024-05-22 ENCOUNTER — Encounter (HOSPITAL_COMMUNITY): Payer: Self-pay

## 2024-05-22 ENCOUNTER — Ambulatory Visit

## 2024-05-22 VITALS — BP 126/70 | HR 69 | Temp 98.2°F | Resp 18 | Ht 63.0 in | Wt 217.5 lb

## 2024-05-22 VITALS — BP 129/80 | HR 64 | Resp 20 | Ht 63.0 in | Wt 217.0 lb

## 2024-05-22 DIAGNOSIS — Z01818 Encounter for other preprocedural examination: Secondary | ICD-10-CM

## 2024-05-22 DIAGNOSIS — D3A090 Benign carcinoid tumor of the bronchus and lung: Secondary | ICD-10-CM

## 2024-05-22 HISTORY — DX: Other specified postprocedural states: Z98.890

## 2024-05-22 HISTORY — DX: Anxiety disorder, unspecified: F41.9

## 2024-05-22 HISTORY — DX: Attention-deficit hyperactivity disorder, unspecified type: F90.9

## 2024-05-22 HISTORY — DX: Dyspnea, unspecified: R06.00

## 2024-05-22 HISTORY — DX: Unspecified hearing loss, unspecified ear: H91.90

## 2024-05-22 LAB — URINALYSIS, ROUTINE W REFLEX MICROSCOPIC
Bilirubin Urine: NEGATIVE
Glucose, UA: NEGATIVE mg/dL
Hgb urine dipstick: NEGATIVE
Ketones, ur: NEGATIVE mg/dL
Leukocytes,Ua: NEGATIVE
Nitrite: NEGATIVE
Protein, ur: NEGATIVE mg/dL
Specific Gravity, Urine: 1.011 (ref 1.005–1.030)
pH: 7 (ref 5.0–8.0)

## 2024-05-22 LAB — COMPREHENSIVE METABOLIC PANEL WITH GFR
ALT: 15 U/L (ref 0–44)
AST: 29 U/L (ref 15–41)
Albumin: 3.4 g/dL — ABNORMAL LOW (ref 3.5–5.0)
Alkaline Phosphatase: 64 U/L (ref 38–126)
Anion gap: 9 (ref 5–15)
BUN: <5 mg/dL — ABNORMAL LOW (ref 6–20)
CO2: 29 mmol/L (ref 22–32)
Calcium: 8.1 mg/dL — ABNORMAL LOW (ref 8.9–10.3)
Chloride: 103 mmol/L (ref 98–111)
Creatinine, Ser: 0.58 mg/dL (ref 0.44–1.00)
GFR, Estimated: >60 mL/min (ref >60)
Glucose, Bld: 123 mg/dL — ABNORMAL HIGH (ref 70–99)
Potassium: 3.6 mmol/L (ref 3.5–5.1)
Sodium: 141 mmol/L (ref 135–145)
Total Bilirubin: 0.7 mg/dL (ref 0.0–1.2)
Total Protein: 6.4 g/dL — ABNORMAL LOW (ref 6.5–8.1)

## 2024-05-22 LAB — CBC
HCT: 38.4 % (ref 36.0–46.0)
Hemoglobin: 11.7 g/dL — ABNORMAL LOW (ref 12.0–15.0)
MCH: 23.8 pg — ABNORMAL LOW (ref 26.0–34.0)
MCHC: 30.5 g/dL (ref 30.0–36.0)
MCV: 78.2 fL — ABNORMAL LOW (ref 80.0–100.0)
Platelets: 81 K/uL — ABNORMAL LOW (ref 150–400)
RBC: 4.91 MIL/uL (ref 3.87–5.11)
RDW: 16.3 % — ABNORMAL HIGH (ref 11.5–15.5)
WBC: 3.1 K/uL — ABNORMAL LOW (ref 4.0–10.5)
nRBC: 0 % (ref 0.0–0.2)

## 2024-05-22 LAB — TYPE AND SCREEN
ABO/RH(D): A NEG
Antibody Screen: NEGATIVE

## 2024-05-22 LAB — SURGICAL PCR SCREEN
MRSA, PCR: NEGATIVE
Staphylococcus aureus: NEGATIVE

## 2024-05-22 LAB — PROTIME-INR
INR: 1.1 (ref 0.8–1.2)
Prothrombin Time: 14.4 s (ref 11.4–15.2)

## 2024-05-22 LAB — APTT: aPTT: 29 s (ref 24–36)

## 2024-05-22 LAB — GLUCOSE, CAPILLARY: Glucose-Capillary: 146 mg/dL — ABNORMAL HIGH (ref 70–99)

## 2024-05-22 NOTE — Progress Notes (Signed)
 PCP - Dr Aurora Molt Cardiologist - Dr. Rober Chroman (clearance pending) Endocrinology - Dr Donnice Lipps Hematology & Oncology - Dr Mackey Chad Pulmonology - Dr Dipti Pleas  Chest x-ray - 05/22/24 EKG - 05/22/24 Stress Test - 06/09/08 ECHO - 03/31/24 Cardiac Cath - n/a  ICD Pacemaker/Loop - n/a  Sleep Study -  Yes CPAP - none, patient has lost weight   Diabetes Type 2 Fasting Blood Sugar - 140s Checks Blood Sugar several times a day [employer] 3, sensor right arm  Per MD, stop Metformin  2 days prior to procedure.  Last dose will be on Friday, 05/23/24.   THE NIGHT BEFORE SURGERY, take 9 units of Lantus  Insulin .      THE MORNING OF SURGERY, do not take Tradjenta  or Metformin .  The day of surgery, do not take other diabetes injectables, including Byetta (exenatide), Bydureon (exenatide ER), Victoza  (liraglutide ), or Trulicity (dulaglutide).  If your CBG is greater than 220 mg/dL, you may take  of your sliding scale (correction) dose of insulin .  If your blood sugar is less than 70 mg/dL, you will need to treat for low blood sugar: Treat a low blood sugar (less than 70 mg/dL) with  cup of clear juice (cranberry or apple), 4 glucose tablets, OR glucose gel. Recheck blood sugar in 15 minutes after treatment (to make sure it is greater than 70 mg/dL). If your blood sugar is not greater than 70 mg/dL on recheck, call [phone] for further instructions.  Aspirin  & Blood Thinner Instructions:  n/a  NPO  Anesthesia review: Yes  STOP now taking any Aspirin  (unless otherwise instructed by your surgeon), Aleve , Naproxen , Ibuprofen , Motrin , Advil , Goody's, BC's, all herbal medications, fish oil, and all vitamins.   Coronavirus Screening Do you have any of the following symptoms:  Cough yes/no: No Fever (>100.91F)  yes/no: No Runny nose yes/no: No Sore throat yes/no: No Difficulty breathing/shortness of breath  Yes  Have you traveled in the last 14 days and where?  yes/no: No  Patient verbalized understanding of instructions that were given to them at the PAT appointment. Patient was also instructed that they will need to review over the PAT instructions again at home before surgery.

## 2024-05-22 NOTE — H&P (View-Only) (Signed)
 [address] 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   She was found to have a lung nodule back in 2022.  She had a bronchoscopy and biopsy showed carcinoid tumor.  This was suppose to be followed radiographically, but she was lost to follow up. She recently was referred back to pulmonology for shortness of breath and CT scan showed the nodule increased in size from 1.3 to 1.5 cm in diameter.  She presents to the clinic today for pre-operative visit.  She is still experiencing shortness of breath mainly exertion. She describes this at baseline and has not worsened.  She denies coughing and wheezing.  She was seen by her cardiologist, Dr. Constance for her shortness of breath on exertion. She recently had a 2-D echo done which showed normal LV systolic function and no wall motion abnormalities. Her EKG showed normal sinus rhythm with RSR pattern in V1 and no new acute ischemic changes. EKG was unchanged from previous and was cleared for surgery. She denies chest pain, palpations, and lower leg swelling.      Current Outpatient Medications on File Prior to Visit  Medication Sig Dispense Refill   ACCU-CHEK GUIDE test strip      Accu-Chek Softclix Lancets lancets daily.     atorvastatin  (LIPITOR) 40 MG tablet Take 40 mg by mouth daily.     dicyclomine (BENTYL) 20 MG tablet Take 20 mg by mouth 4 (four) times daily as needed for spasms.     ezetimibe (ZETIA) 10 MG tablet Take 10 mg by mouth daily.     FLUoxetine (PROZAC) 20  MG capsule Take 20 mg by mouth daily.     fluticasone-salmeterol (ADVAIR  HFA) 230-21 MCG/ACT inhaler Inhale 2 puffs into the lungs 2 (two) times daily. 12 g 6   gabapentin  (NEURONTIN ) 400 MG capsule Take 400-1,200 mg by mouth See admin instructions. Takes 400 mg in the morning 400 mg in the afternoon and 400 mg at night     HYDROcodone -acetaminophen  (NORCO/VICODIN) 5-325 MG tablet Take 1 tablet by mouth in the morning, at noon, and at bedtime.     hydrOXYzine  (ATARAX ) 25 MG tablet Take 25 mg by mouth 3 (three) times daily as needed for anxiety.     insulin  glargine (LANTUS ) 100 UNIT/ML injection Inject 18 Units into the skin at bedtime.     Insulin  Pen Needle 32G X 4 MM MISC 1 each by Does not apply route.     linagliptin  (TRADJENTA ) 5 MG TABS tablet Take 5 mg by mouth daily.     lisinopril  (PRINIVIL ,ZESTRIL ) 5 MG tablet Take 5 mg by mouth daily.     metFORMIN  (GLUCOPHAGE -XR) 500 MG 24 hr tablet Take 500 mg by mouth 2 (two) times daily.     metoCLOPramide  (REGLAN ) 10 MG tablet Take 1 tablet (10 mg total) by mouth every 6 (six) hours. (Patient taking differently: Take 10 mg by mouth every 6 (six) hours as needed for vomiting or nausea.) 30 tablet 0   naloxone (  NARCAN) nasal spray 4 mg/0.1 mL SMARTSIG:Both Nares     ondansetron  (ZOFRAN -ODT) 4 MG disintegrating tablet Take 4 mg by mouth every 6 (six) hours as needed for vomiting or nausea.     pantoprazole  (PROTONIX ) 40 MG tablet Take 40 mg by mouth 2 (two) times daily.   0   Peppermint Oil (IBGARD) 90 MG CPCR Take 2 capsules by mouth daily.     promethazine  (PHENERGAN ) 12.5 MG tablet Take 12.5 mg by mouth every 6 (six) hours as needed for vomiting or nausea.     risperiDONE  (RISPERDAL ) 2 MG tablet Take 2 mg by mouth at bedtime.   2   sucralfate  (CARAFATE ) 1 g tablet Take 1 tablet (1 g total) by mouth 4 (four) times daily -  with meals and at bedtime. 120 tablet 0   SYNTHROID  175 MCG tablet Take 175 mcg by mouth daily.     No current  facility-administered medications on file prior to visit.     ROS: Review of Systems  Constitutional:  Negative for chills and fever.  Respiratory:  Positive for shortness of breath. Negative for cough.        On exertion  Cardiovascular:  Negative for chest pain, palpitations and leg swelling.     BP 129/80   Pulse 64   Resp 20   Ht 5' 3 (1.6 m)   Wt 217 lb (98.4 kg)   SpO2 96% Comment: RA  BMI 38.44 kg/m   Physical Exam Constitutional:      Appearance: Normal appearance.  HENT:     Head: Normocephalic and atraumatic.  Cardiovascular:     Rate and Rhythm: Normal rate and regular rhythm.     Heart sounds: Normal heart sounds, S1 normal and S2 normal.  Pulmonary:     Effort: Pulmonary effort is normal.     Breath sounds: Normal breath sounds.  Musculoskeletal:     Cervical back: Normal range of motion.  Skin:    General: Skin is warm and dry.  Neurological:     General: No focal deficit present.     Mental Status: She is alert.      Imaging:  CLINICAL DATA:  Pulmonary nodule follow-up   EXAM: CT CHEST WITHOUT CONTRAST   TECHNIQUE: Multidetector CT imaging of the chest was performed following the standard protocol without IV contrast.   RADIATION DOSE REDUCTION: This exam was performed according to the departmental dose-optimization program which includes automated exposure control, adjustment of the mA and/or kV according to patient size and/or use of iterative reconstruction technique.   COMPARISON:  04/12/2021   FINDINGS: Cardiovascular: No significant vascular findings. Mild aortic and coronary artery calcification. Normal heart size. No pericardial effusion.   Mediastinum/Nodes: No enlarged mediastinal or axillary lymph nodes. Thyroid  gland, trachea, and esophagus demonstrate no significant findings.   Lungs/Pleura: Continued slow interval growth of a solid pulmonary nodule in the perihilar aspect of the left lower lobe measuring 16 x 16 mm  (series 8, image 82), previously measured 13 x 13 mm on 04/12/2021. Scattered small calcified granulomas noted bilaterally. Lungs are otherwise clear. No airspace consolidation, pleural effusion, or pneumothorax.   Upper Abdomen: Nodular hepatic surface contour. Splenomegaly. Upper abdominal and paraesophageal varices.   Musculoskeletal: No chest wall mass or suspicious bone lesions identified.   IMPRESSION: 1. Continued slow interval growth of a solid pulmonary nodule in the perihilar aspect of the left lower lobe measuring 16 x 16 mm, previously measured 13 x 13 mm on 04/12/2021.  Findings are concerning for a slow-growing neoplasm. Tissue sampling recommended if not previously performed. 2. Cirrhosis with sequela of portal hypertension. 3. Aortic and coronary artery atherosclerosis (ICD10-I70.0).     Electronically Signed   By: Mabel Converse D.O.   On: 03/19/2024 10:23      A/P:  Carcinoid tumor of left lung (HCC) -Discussed the proposed operative procedure with her for robotic assisted left lower lobectomy.  -We reviewed the general nature of the procedure including the need for general anesthesia, the incisions to be used, use of drains to postoperatively, the expected hospital stay, and the overall recovery. I informed her of the indications, risks, benefits, and alternatives. She understands the risks include, but not limited to death, MI, DVT, PE, bleeding, possible need for transfusion, infection, prolonged air leak, cardiac arrhythmias, conversion to open procedure, as well as the possibility of other unforeseeable complications.  -All of her questions were answered, discussed that if she had anymore then she could reach our office via telephone or via mychart -She has been seen by her cardiology Dr. Levern on 04/21/2024. She recently had a 2-D echo done which showed normal LV systolic function and no wall motion abnormalities. Her EKG on 04/21/2024  showed normal sinus  rhythm with RSR pattern in V1 and no new acute ischemic changes. EKG was unchanged from previous and was cleared by cardiology.  Office visit note has been faxed and uploaded into epic  -Surgery is scheduled for 05/26/2024 and she states that she knows when and where to go for her procedure.     Rhonda CHRISTELLA Rough, PA-C 05/22/24

## 2024-05-22 NOTE — Progress Notes (Signed)
 Surgical Instructions   Your procedure is scheduled on May 26, 2024 Report to [facility] Main Entrance A at 8:30 A.M., then check in with the Admitting office. Any questions or running late day of surgery: call [phone]  Questions prior to your surgery date: call [phone], Monday-Friday, 8am-4pm. If you experience any cold or flu symptoms such as cough, fever, chills, shortness of breath, etc. between now and your scheduled surgery, please notify us  at the above number.     Remember:  Do not eat or drink after midnight the night before your surgery  Take these medicines the morning of surgery with A SIP OF WATER   atorvastatin  (LIPITOR)  ezetimibe (ZETIA)  FLUoxetine (PROZAC)  ADVAIR  HFA  gabapentin  (NEURONTIN )  HYDROcodone -acetaminophen  (NORCO/VICODIN)  pantoprazole  (PROTONIX )  SYNTHROID    May take these medicines IF NEEDED: dicyclomine (BENTYL)  hydrOXYzine  (ATARAX )  metoCLOPramide  (REGLAN )  naloxone (NARCAN) nasal spray  ondansetron  (ZOFRAN -ODT)  promethazine  (PHENERGAN )   WHAT DO I DO ABOUT MY DIABETES MEDICATION?   Do not take oral diabetes medicines linagliptin  (TRADJENTA ), or metFORMIN  (GLUCOPHAGE -XR)  the morning of surgery.  metFORMIN  (GLUCOPHAGE -XR)LAST DOSE 05-23-24  THE NIGHT BEFORE SURGERY, take 9 units of insulin  glargine (LANTUS )  insulin .    .  The day of surgery, do not take other diabetes injectables, including Byetta (exenatide), Bydureon (exenatide ER), Victoza  (liraglutide ), or Trulicity (dulaglutide).  If your CBG is greater than 220 mg/dL, you may take  of your sliding scale (correction) dose of insulin .   HOW TO MANAGE YOUR DIABETES BEFORE AND AFTER SURGERY  Why is it important to control my blood sugar before and after surgery? Improving blood sugar levels before and after surgery helps healing and can limit problems. A way of improving blood sugar control is eating a healthy diet by:  Eating less sugar and carbohydrates   Increasing activity/exercise  Talking with your doctor about reaching your blood sugar goals High blood sugars (greater than 180 mg/dL) can raise your risk of infections and slow your recovery, so you will need to focus on controlling your diabetes during the weeks before surgery. Make sure that the doctor who takes care of your diabetes knows about your planned surgery including the date and location.  How do I manage my blood sugar before surgery? Check your blood sugar at least 4 times a day, starting 2 days before surgery, to make sure that the level is not too high or low.  Check your blood sugar the morning of your surgery when you wake up and every 2 hours until you get to the Short Stay unit.  If your blood sugar is less than 70 mg/dL, you will need to treat for low blood sugar: Do not take insulin . Treat a low blood sugar (less than 70 mg/dL) with  cup of clear juice (cranberry or apple), 4 glucose tablets, OR glucose gel. Recheck blood sugar in 15 minutes after treatment (to make sure it is greater than 70 mg/dL). If your blood sugar is not greater than 70 mg/dL on recheck, call [phone] for further instructions. Report your blood sugar to the short stay nurse when you get to Short Stay.  If you are admitted to the hospital after surgery: Your blood sugar will be checked by the staff and you will probably be given insulin  after surgery (instead of oral diabetes medicines) to make sure you have good blood sugar levels. The goal for blood sugar control after surgery is 80-180 mg/dL.   One week prior to surgery,  STOP taking any Aspirin  (unless otherwise instructed by your surgeon) Aleve , Naproxen , Ibuprofen , Motrin , Advil , Goody's, BC's, all herbal medications, fish oil, and non-prescription vitamins.                     Do NOT Smoke (Tobacco/Vaping) for 24 hours prior to your procedure.  If you use a CPAP at night, you may bring your mask/headgear for your overnight stay.    You will be asked to remove any contacts, glasses, piercing's, hearing aid's, dentures/partials prior to surgery. Please bring cases for these items if needed.    Patients discharged the day of surgery will not be allowed to drive home, and someone needs to stay with them for 24 hours.  SURGICAL WAITING ROOM VISITATION Patients may have no more than 2 support people in the waiting area - these visitors may rotate.   Pre-op nurse will coordinate an appropriate time for 1 ADULT support person, who may not rotate, to accompany patient in pre-op.  Children under the age of 23 must have an adult with them who is not the patient and must remain in the main waiting area with an adult.  If the patient needs to stay at the hospital during part of their recovery, the visitor guidelines for inpatient rooms apply.  Please refer to the [facility] website for the visitor guidelines for any additional information.   If you received a COVID test during your pre-op visit  it is requested that you wear a mask when out in public, stay away from anyone that may not be feeling well and notify your surgeon if you develop symptoms. If you have been in contact with anyone that has tested positive in the last 10 days please notify you surgeon.      Pre-operative CHG Bathing Instructions    DO NOT use if you have an allergy to chlorhexidine /CHG or antibacterial soaps. If your skin becomes reddened or irritated, stop using the CHG and notify one of our RNs at [phone].              TAKE A SHOWER THE NIGHT BEFORE SURGERY WITH ANTIBACTERIAL SOAP  ANTIBACTERIAL Shower Instructions:  Wash your face and private area with normal soap. If you choose to wash your hair, wash first with your normal shampoo.  After you use shampoo/soap, rinse your hair and body thoroughly to remove shampoo/soap residue.  Shower with Antibacterial Soap, rinse off 3   Pat dry with a clean towel  4  Put on clean pajamas    Additional  instructions for the day of surgery: If you choose, you may shower the morning of surgery with an antibacterial soap.  DO NOT APPLY any lotions, deodorants, cologne, or perfumes.   Do not wear jewelry or makeup Do not wear nail polish, gel polish, artificial nails, or any other type of covering on natural nails (fingers and toes) Do not bring valuables to the hospital. [facility] is not responsible for valuables/personal belongings. Put on clean/comfortable clothes.  Please brush your teeth.  Ask your nurse before applying any prescription medications to the skin.

## 2024-05-22 NOTE — Progress Notes (Signed)
 [address] 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   She was found to have a lung nodule back in 2022.  She had a bronchoscopy and biopsy showed carcinoid tumor.  This was suppose to be followed radiographically, but she was lost to follow up. She recently was referred back to pulmonology for shortness of breath and CT scan showed the nodule increased in size from 1.3 to 1.5 cm in diameter.  She presents to the clinic today for pre-operative visit.  She is still experiencing shortness of breath mainly exertion. She describes this at baseline and has not worsened.  She denies coughing and wheezing.  She was seen by her cardiologist, Dr. Constance for her shortness of breath on exertion. She recently had a 2-D echo done which showed normal LV systolic function and no wall motion abnormalities. Her EKG showed normal sinus rhythm with RSR pattern in V1 and no new acute ischemic changes. EKG was unchanged from previous and was cleared for surgery. She denies chest pain, palpations, and lower leg swelling.      Current Outpatient Medications on File Prior to Visit  Medication Sig Dispense Refill   ACCU-CHEK GUIDE test strip      Accu-Chek Softclix Lancets lancets daily.     atorvastatin  (LIPITOR) 40 MG tablet Take 40 mg by mouth daily.     dicyclomine (BENTYL) 20 MG tablet Take 20 mg by mouth 4 (four) times daily as needed for spasms.     ezetimibe (ZETIA) 10 MG tablet Take 10 mg by mouth daily.     FLUoxetine (PROZAC) 20  MG capsule Take 20 mg by mouth daily.     fluticasone-salmeterol (ADVAIR  HFA) 230-21 MCG/ACT inhaler Inhale 2 puffs into the lungs 2 (two) times daily. 12 g 6   gabapentin  (NEURONTIN ) 400 MG capsule Take 400-1,200 mg by mouth See admin instructions. Takes 400 mg in the morning 400 mg in the afternoon and 400 mg at night     HYDROcodone -acetaminophen  (NORCO/VICODIN) 5-325 MG tablet Take 1 tablet by mouth in the morning, at noon, and at bedtime.     hydrOXYzine  (ATARAX ) 25 MG tablet Take 25 mg by mouth 3 (three) times daily as needed for anxiety.     insulin  glargine (LANTUS ) 100 UNIT/ML injection Inject 18 Units into the skin at bedtime.     Insulin  Pen Needle 32G X 4 MM MISC 1 each by Does not apply route.     linagliptin  (TRADJENTA ) 5 MG TABS tablet Take 5 mg by mouth daily.     lisinopril  (PRINIVIL ,ZESTRIL ) 5 MG tablet Take 5 mg by mouth daily.     metFORMIN  (GLUCOPHAGE -XR) 500 MG 24 hr tablet Take 500 mg by mouth 2 (two) times daily.     metoCLOPramide  (REGLAN ) 10 MG tablet Take 1 tablet (10 mg total) by mouth every 6 (six) hours. (Patient taking differently: Take 10 mg by mouth every 6 (six) hours as needed for vomiting or nausea.) 30 tablet 0   naloxone (  NARCAN) nasal spray 4 mg/0.1 mL SMARTSIG:Both Nares     ondansetron  (ZOFRAN -ODT) 4 MG disintegrating tablet Take 4 mg by mouth every 6 (six) hours as needed for vomiting or nausea.     pantoprazole  (PROTONIX ) 40 MG tablet Take 40 mg by mouth 2 (two) times daily.   0   Peppermint Oil (IBGARD) 90 MG CPCR Take 2 capsules by mouth daily.     promethazine  (PHENERGAN ) 12.5 MG tablet Take 12.5 mg by mouth every 6 (six) hours as needed for vomiting or nausea.     risperiDONE  (RISPERDAL ) 2 MG tablet Take 2 mg by mouth at bedtime.   2   sucralfate  (CARAFATE ) 1 g tablet Take 1 tablet (1 g total) by mouth 4 (four) times daily -  with meals and at bedtime. 120 tablet 0   SYNTHROID  175 MCG tablet Take 175 mcg by mouth daily.     No current  facility-administered medications on file prior to visit.     ROS: Review of Systems  Constitutional:  Negative for chills and fever.  Respiratory:  Positive for shortness of breath. Negative for cough.        On exertion  Cardiovascular:  Negative for chest pain, palpitations and leg swelling.     BP 129/80   Pulse 64   Resp 20   Ht 5' 3 (1.6 m)   Wt 217 lb (98.4 kg)   SpO2 96% Comment: RA  BMI 38.44 kg/m   Physical Exam Constitutional:      Appearance: Normal appearance.  HENT:     Head: Normocephalic and atraumatic.  Cardiovascular:     Rate and Rhythm: Normal rate and regular rhythm.     Heart sounds: Normal heart sounds, S1 normal and S2 normal.  Pulmonary:     Effort: Pulmonary effort is normal.     Breath sounds: Normal breath sounds.  Musculoskeletal:     Cervical back: Normal range of motion.  Skin:    General: Skin is warm and dry.  Neurological:     General: No focal deficit present.     Mental Status: She is alert.      Imaging:  CLINICAL DATA:  Pulmonary nodule follow-up   EXAM: CT CHEST WITHOUT CONTRAST   TECHNIQUE: Multidetector CT imaging of the chest was performed following the standard protocol without IV contrast.   RADIATION DOSE REDUCTION: This exam was performed according to the departmental dose-optimization program which includes automated exposure control, adjustment of the mA and/or kV according to patient size and/or use of iterative reconstruction technique.   COMPARISON:  04/12/2021   FINDINGS: Cardiovascular: No significant vascular findings. Mild aortic and coronary artery calcification. Normal heart size. No pericardial effusion.   Mediastinum/Nodes: No enlarged mediastinal or axillary lymph nodes. Thyroid  gland, trachea, and esophagus demonstrate no significant findings.   Lungs/Pleura: Continued slow interval growth of a solid pulmonary nodule in the perihilar aspect of the left lower lobe measuring 16 x 16 mm  (series 8, image 82), previously measured 13 x 13 mm on 04/12/2021. Scattered small calcified granulomas noted bilaterally. Lungs are otherwise clear. No airspace consolidation, pleural effusion, or pneumothorax.   Upper Abdomen: Nodular hepatic surface contour. Splenomegaly. Upper abdominal and paraesophageal varices.   Musculoskeletal: No chest wall mass or suspicious bone lesions identified.   IMPRESSION: 1. Continued slow interval growth of a solid pulmonary nodule in the perihilar aspect of the left lower lobe measuring 16 x 16 mm, previously measured 13 x 13 mm on 04/12/2021.  Findings are concerning for a slow-growing neoplasm. Tissue sampling recommended if not previously performed. 2. Cirrhosis with sequela of portal hypertension. 3. Aortic and coronary artery atherosclerosis (ICD10-I70.0).     Electronically Signed   By: Mabel Converse D.O.   On: 03/19/2024 10:23      A/P:  Carcinoid tumor of left lung (HCC) -Discussed the proposed operative procedure with her for robotic assisted left lower lobectomy.  -We reviewed the general nature of the procedure including the need for general anesthesia, the incisions to be used, use of drains to postoperatively, the expected hospital stay, and the overall recovery. I informed her of the indications, risks, benefits, and alternatives. She understands the risks include, but not limited to death, MI, DVT, PE, bleeding, possible need for transfusion, infection, prolonged air leak, cardiac arrhythmias, conversion to open procedure, as well as the possibility of other unforeseeable complications.  -All of her questions were answered, discussed that if she had anymore then she could reach our office via telephone or via mychart -She has been seen by her cardiology Dr. Levern on 04/21/2024. She recently had a 2-D echo done which showed normal LV systolic function and no wall motion abnormalities. Her EKG on 04/21/2024  showed normal sinus  rhythm with RSR pattern in V1 and no new acute ischemic changes. EKG was unchanged from previous and was cleared by cardiology.  Office visit note has been faxed and uploaded into epic  -Surgery is scheduled for 05/26/2024 and she states that she knows when and where to go for her procedure.     Manuelita CHRISTELLA Rough, PA-C 05/22/24

## 2024-05-22 NOTE — Progress Notes (Signed)
 Anesthesia Chart Review:   53 yo female with pertinent hx including PONV, borderline personality disorder, HLD, HTN, GERD on PPI, hypothyroidism s/p total thyroidectomy, IDDM2 (A1c 7.4 on 05/07/2024), Hx of OSA (reports no longer on CPAP after weight loss), cirrhosis with resultant thrombocytopenia, s/p C5-7 ACDF, class II obesity BMI 38.   Follows with cardiologist Dr. Levern for risk factor modification in the setting of family hx of CVD. Pt has no personal hx of CVD.  Recent echo 03/31/2024 showed LVEF 60 to 65%, normal RV, no significant valvular abnormalities. Pt was seen bby Dr. Levern 04/21/24 and cleared to proceed at acceptable risk from cardiac standpoint. Clearance scanned into Media.   Chronic thrombocytopenia. Evaluated by hematology and felt secondary to cirrhosis. EGD 11/20/23 with grade I esophageal varices and diffuse portal hypertensive gastropathy.   Found to have lung nodule in 2022.  Bronchoscopy and biopsy showed carcinoid tumor.  Plan was to follow radiographically, however she was lost to follow-up.  Recently seen by pulmonology and CT was ordered which showed nodule increased in size from 1.3 to 1.5 cm in diameter.  Preop labs reviewed, mild anemia with hemoglobin 11.7, moderate chronic thrombocytopenia platelets 81k, otherwise unremarkable.   EKG 05/22/2024: Suspected lead reversal.  Interpretation assumes no reversal.  Unusual P axis, possible ectopic atrial bradycardia with undetermined rhythm irregularity.  Right superior axis deviation.  Low voltage QRS.  ST and T wave abnormality, consider inferior ischemia.  When compared with ECG of 17-Jan-2023, QRS and P wave axis have shifted.   CT Chest 03/13/2024: IMPRESSION: 1. Continued slow interval growth of a solid pulmonary nodule in the perihilar aspect of the left lower lobe measuring 16 x 16 mm, previously measured 13 x 13 mm on 04/12/2021. Findings are concerning for a slow-growing neoplasm. Tissue sampling  recommended if not previously performed. 2. Cirrhosis with sequela of portal hypertension. 3. Aortic and coronary artery atherosclerosis (ICD10-I70.0).  TTE 03/31/2024: 1. Left ventricular ejection fraction, by estimation, is 60 to 65%. The  left ventricle has normal function. The left ventricle has no regional  wall motion abnormalities. Left ventricular diastolic parameters were  normal.   2. Right ventricular systolic function is normal. The right ventricular  size is normal.   3. Left atrial size was moderately dilated.   4. No subcostal window . Agitated saline contrast bubble study was  negative, with no evidence of any interatrial shunt.   5. Right atrial size was mildly dilated.   6. The mitral valve is abnormal. Trivial mitral valve regurgitation. No  evidence of mitral stenosis.   7. The aortic valve is tricuspid. Aortic valve regurgitation is not  visualized. No aortic stenosis is present.   8. The inferior vena cava is normal in size with greater than 50%  respiratory variability, suggesting right atrial pressure of 3 mmHg.     Lynwood Geofm RIGGERS [facility] Short Stay Center/Anesthesiology Phone [phone] 05/23/2024 3:09 PM

## 2024-05-23 ENCOUNTER — Encounter: Payer: Self-pay | Admitting: Cardiology

## 2024-05-23 NOTE — Anesthesia Preprocedure Evaluation (Addendum)
 Anesthesia Evaluation  Patient identified by MRN, date of birth, ID band Patient awake    Reviewed: Allergy & Precautions, NPO status , Patient's Chart, lab work & pertinent test results  History of Anesthesia Complications (+) PONV  Airway Mallampati: II  TM Distance: >3 FB Neck ROM: Full    Dental  (+) Poor Dentition, Missing, Dental Advisory Given   Pulmonary sleep apnea , COPD,  COPD inhaler   breath sounds clear to auscultation       Cardiovascular hypertension, Pt. on medications (-) angina  Rhythm:Regular Rate:Normal  03/31/2024 ECHO: EF 60 to 65%.  1.The LV has normal function, no regional wall motion abnormalities. Left ventricular diastolic parameters were normal.   2. RVF is normal. The right ventricular size is normal.   3. Left atrial size was moderately dilated.   4. No subcostal window . Agitated saline contrast bubble study was negative, with no evidence of any interatrial shunt.   5. Right atrial size was mildly dilated.   6. The mitral valve is abnormal. Trivial mitral valve regurgitation. No evidence of mitral stenosis.   7. The aortic valve is tricuspid. Aortic valve regurgitation is not visualized. No aortic stenosis is present.     Neuro/Psych   Anxiety Depression       GI/Hepatic ,GERD  Medicated and Controlled,,(+) Cirrhosis   Esophageal Varices      Endo/Other  diabetes, Insulin  Dependent, Oral Hypoglycemic AgentsHypothyroidism    Renal/GU negative Renal ROS     Musculoskeletal   Abdominal   Peds  Hematology Hb 11.7, plt 81k Chronic thrombocytopenia (cirrhosis)   Anesthesia Other Findings   Reproductive/Obstetrics                              Anesthesia Physical Anesthesia Plan  ASA: 3  Anesthesia Plan: General   Post-op Pain Management: Tylenol  PO (pre-op)*   Induction: Intravenous  PONV Risk Score and Plan: 4 or greater and Ondansetron , Dexamethasone   and Scopolamine  patch - Pre-op  Airway Management Planned: Oral ETT and Double Lumen EBT  Additional Equipment: Arterial line  Intra-op Plan:   Post-operative Plan: Extubation in OR  Informed Consent: I have reviewed the patients History and Physical, chart, labs and discussed the procedure including the risks, benefits and alternatives for the proposed anesthesia with the patient or authorized representative who has indicated his/her understanding and acceptance.     Dental advisory given  Plan Discussed with: CRNA and Surgeon  Anesthesia Plan Comments: (PAT note by Lynwood Hope, PA-C: 53 yo female with pertinent hx including PONV, borderline personality disorder, HLD, HTN, GERD on PPI, hypothyroidism s/p total thyroidectomy, IDDM2 (A1c 7.4 on 05/07/2024), Hx of OSA (reports no longer on CPAP after weight loss), cirrhosis with resultant thrombocytopenia, s/p C5-7 ACDF, class II obesity BMI 38.   Follows with cardiologist Dr. Levern for risk factor modification in the setting of family hx of CVD. Pt has no personal hx of CVD.  Recent echo 03/31/2024 showed LVEF 60 to 65%, normal RV, no significant valvular abnormalities. Pt was seen bby Dr. Levern 04/21/24 and cleared to proceed at acceptable risk from cardiac standpoint. Clearance scanned into Media.   Chronic thrombocytopenia. Evaluated by hematology and felt secondary to cirrhosis. EGD 11/20/23 with grade I esophageal varices and diffuse portal hypertensive gastropathy.   Found to have lung nodule in 2022.  Bronchoscopy and biopsy showed carcinoid tumor.  Plan was to follow radiographically, however she was lost  to follow-up.  Recently seen by pulmonology and CT was ordered which showed nodule increased in size from 1.3 to 1.5 cm in diameter.  Preop labs reviewed, mild anemia with hemoglobin 11.7, moderate chronic thrombocytopenia platelets 81k, otherwise unremarkable.   EKG 05/22/2024: Suspected lead reversal.  Interpretation assumes no  reversal.  Unusual P axis, possible ectopic atrial bradycardia with undetermined rhythm irregularity.  Right superior axis deviation.  Low voltage QRS.  ST and T wave abnormality, consider inferior ischemia.  When compared with ECG of 17-Jan-2023, QRS and P wave axis have shifted.   CT Chest 03/13/2024: IMPRESSION: 1. Continued slow interval growth of a solid pulmonary nodule in the perihilar aspect of the left lower lobe measuring 16 x 16 mm, previously measured 13 x 13 mm on 04/12/2021. Findings are concerning for a slow-growing neoplasm. Tissue sampling recommended if not previously performed. 2. Cirrhosis with sequela of portal hypertension. 3. Aortic and coronary artery atherosclerosis (ICD10-I70.0).  TTE 03/31/2024: 1. Left ventricular ejection fraction, by estimation, is 60 to 65%. The  left ventricle has normal function. The left ventricle has no regional  wall motion abnormalities. Left ventricular diastolic parameters were  normal.  2. Right ventricular systolic function is normal. The right ventricular  size is normal.  3. Left atrial size was moderately dilated.  4. No subcostal window . Agitated saline contrast bubble study was  negative, with no evidence of any interatrial shunt.  5. Right atrial size was mildly dilated.  6. The mitral valve is abnormal. Trivial mitral valve regurgitation. No  evidence of mitral stenosis.  7. The aortic valve is tricuspid. Aortic valve regurgitation is not  visualized. No aortic stenosis is present.  8. The inferior vena cava is normal in size with greater than 50%  respiratory variability, suggesting right atrial pressure of 3 mmHg.    )         Anesthesia Quick Evaluation

## 2024-05-26 ENCOUNTER — Encounter (HOSPITAL_COMMUNITY)
Admission: RE | Disposition: A | Payer: Self-pay | Source: Home / Self Care | Attending: Thoracic Surgery (Cardiothoracic Vascular Surgery)

## 2024-05-26 ENCOUNTER — Other Ambulatory Visit: Payer: Self-pay

## 2024-05-26 ENCOUNTER — Inpatient Hospital Stay (HOSPITAL_COMMUNITY): Payer: Self-pay | Admitting: Physician Assistant

## 2024-05-26 ENCOUNTER — Encounter (HOSPITAL_COMMUNITY): Payer: Self-pay | Admitting: Thoracic Surgery (Cardiothoracic Vascular Surgery)

## 2024-05-26 ENCOUNTER — Inpatient Hospital Stay (HOSPITAL_COMMUNITY)
Admission: RE | Admit: 2024-05-26 | Discharge: 2024-05-29 | DRG: 164 | Disposition: A | Attending: Thoracic Surgery (Cardiothoracic Vascular Surgery) | Admitting: Thoracic Surgery (Cardiothoracic Vascular Surgery)

## 2024-05-26 ENCOUNTER — Inpatient Hospital Stay (HOSPITAL_COMMUNITY)

## 2024-05-26 DIAGNOSIS — E890 Postprocedural hypothyroidism: Secondary | ICD-10-CM | POA: Diagnosis present

## 2024-05-26 DIAGNOSIS — Z7984 Long term (current) use of oral hypoglycemic drugs: Secondary | ICD-10-CM

## 2024-05-26 DIAGNOSIS — Z87440 Personal history of urinary (tract) infections: Secondary | ICD-10-CM

## 2024-05-26 DIAGNOSIS — G4733 Obstructive sleep apnea (adult) (pediatric): Secondary | ICD-10-CM | POA: Diagnosis present

## 2024-05-26 DIAGNOSIS — E119 Type 2 diabetes mellitus without complications: Secondary | ICD-10-CM | POA: Diagnosis present

## 2024-05-26 DIAGNOSIS — E669 Obesity, unspecified: Secondary | ICD-10-CM | POA: Diagnosis present

## 2024-05-26 DIAGNOSIS — Z981 Arthrodesis status: Secondary | ICD-10-CM

## 2024-05-26 DIAGNOSIS — R911 Solitary pulmonary nodule: Secondary | ICD-10-CM | POA: Diagnosis present

## 2024-05-26 DIAGNOSIS — E785 Hyperlipidemia, unspecified: Secondary | ICD-10-CM | POA: Diagnosis present

## 2024-05-26 DIAGNOSIS — K0889 Other specified disorders of teeth and supporting structures: Secondary | ICD-10-CM | POA: Diagnosis present

## 2024-05-26 DIAGNOSIS — Z7951 Long term (current) use of inhaled steroids: Secondary | ICD-10-CM | POA: Diagnosis not present

## 2024-05-26 DIAGNOSIS — D696 Thrombocytopenia, unspecified: Secondary | ICD-10-CM | POA: Diagnosis present

## 2024-05-26 DIAGNOSIS — I10 Essential (primary) hypertension: Secondary | ICD-10-CM | POA: Diagnosis not present

## 2024-05-26 DIAGNOSIS — C3432 Malignant neoplasm of lower lobe, left bronchus or lung: Secondary | ICD-10-CM | POA: Diagnosis not present

## 2024-05-26 DIAGNOSIS — H9190 Unspecified hearing loss, unspecified ear: Secondary | ICD-10-CM | POA: Diagnosis present

## 2024-05-26 DIAGNOSIS — Z923 Personal history of irradiation: Secondary | ICD-10-CM

## 2024-05-26 DIAGNOSIS — Z87442 Personal history of urinary calculi: Secondary | ICD-10-CM

## 2024-05-26 DIAGNOSIS — G8929 Other chronic pain: Secondary | ICD-10-CM | POA: Diagnosis present

## 2024-05-26 DIAGNOSIS — Z8542 Personal history of malignant neoplasm of other parts of uterus: Secondary | ICD-10-CM

## 2024-05-26 DIAGNOSIS — D3A090 Benign carcinoid tumor of the bronchus and lung: Principal | ICD-10-CM | POA: Diagnosis present

## 2024-05-26 DIAGNOSIS — D62 Acute posthemorrhagic anemia: Secondary | ICD-10-CM | POA: Diagnosis not present

## 2024-05-26 DIAGNOSIS — Z79899 Other long term (current) drug therapy: Secondary | ICD-10-CM

## 2024-05-26 DIAGNOSIS — Z794 Long term (current) use of insulin: Secondary | ICD-10-CM | POA: Diagnosis not present

## 2024-05-26 DIAGNOSIS — K219 Gastro-esophageal reflux disease without esophagitis: Secondary | ICD-10-CM | POA: Diagnosis present

## 2024-05-26 DIAGNOSIS — Z79890 Hormone replacement therapy (postmenopausal): Secondary | ICD-10-CM | POA: Diagnosis not present

## 2024-05-26 DIAGNOSIS — Z860101 Personal history of adenomatous and serrated colon polyps: Secondary | ICD-10-CM

## 2024-05-26 DIAGNOSIS — K7460 Unspecified cirrhosis of liver: Secondary | ICD-10-CM | POA: Diagnosis present

## 2024-05-26 DIAGNOSIS — F32A Depression, unspecified: Secondary | ICD-10-CM | POA: Diagnosis present

## 2024-05-26 DIAGNOSIS — Z888 Allergy status to other drugs, medicaments and biological substances status: Secondary | ICD-10-CM

## 2024-05-26 DIAGNOSIS — K08409 Partial loss of teeth, unspecified cause, unspecified class: Secondary | ICD-10-CM | POA: Diagnosis present

## 2024-05-26 DIAGNOSIS — J449 Chronic obstructive pulmonary disease, unspecified: Secondary | ICD-10-CM | POA: Diagnosis present

## 2024-05-26 DIAGNOSIS — Z91048 Other nonmedicinal substance allergy status: Secondary | ICD-10-CM

## 2024-05-26 DIAGNOSIS — J9382 Other air leak: Secondary | ICD-10-CM | POA: Diagnosis not present

## 2024-05-26 DIAGNOSIS — F603 Borderline personality disorder: Secondary | ICD-10-CM | POA: Diagnosis present

## 2024-05-26 DIAGNOSIS — Z902 Acquired absence of lung [part of]: Secondary | ICD-10-CM

## 2024-05-26 DIAGNOSIS — Z885 Allergy status to narcotic agent status: Secondary | ICD-10-CM

## 2024-05-26 DIAGNOSIS — Z9889 Other specified postprocedural states: Principal | ICD-10-CM

## 2024-05-26 DIAGNOSIS — F418 Other specified anxiety disorders: Secondary | ICD-10-CM | POA: Diagnosis not present

## 2024-05-26 DIAGNOSIS — C7A090 Malignant carcinoid tumor of the bronchus and lung: Secondary | ICD-10-CM | POA: Diagnosis not present

## 2024-05-26 DIAGNOSIS — Z883 Allergy status to other anti-infective agents status: Secondary | ICD-10-CM

## 2024-05-26 HISTORY — PX: SCALENE NODE BIOPSY: SHX5446

## 2024-05-26 HISTORY — PX: LOBECTOMY, LUNG, ROBOT-ASSISTED, USING VATS: SHX7607

## 2024-05-26 HISTORY — PX: INTERCOSTAL NERVE BLOCK: SHX5021

## 2024-05-26 LAB — GLUCOSE, CAPILLARY
Glucose-Capillary: 165 mg/dL — ABNORMAL HIGH (ref 70–99)
Glucose-Capillary: 147 mg/dL — ABNORMAL HIGH (ref 70–99)
Glucose-Capillary: 218 mg/dL — ABNORMAL HIGH (ref 70–99)

## 2024-05-26 SURGERY — LOBECTOMY, LUNG, ROBOT-ASSISTED, USING VATS
Anesthesia: General | Site: Chest | Laterality: Left

## 2024-05-26 MED ORDER — FLUOXETINE HCL 20 MG PO CAPS
20.0000 mg | ORAL_CAPSULE | Freq: Every day | ORAL | Status: DC
  Administered 2024-05-27 – 2024-05-29 (×3): 20 mg via ORAL
  Filled 2024-05-26 (×3): qty 1

## 2024-05-26 MED ORDER — FENTANYL CITRATE (PF) 250 MCG/5ML IJ SOLN
INTRAMUSCULAR | Status: AC
  Filled 2024-05-26: qty 5

## 2024-05-26 MED ORDER — METOCLOPRAMIDE HCL 5 MG/ML IJ SOLN
10.0000 mg | Freq: Four times a day (QID) | INTRAMUSCULAR | Status: DC
  Administered 2024-05-26 – 2024-05-27 (×2): 10 mg via INTRAVENOUS
  Filled 2024-05-26 (×5): qty 2

## 2024-05-26 MED ORDER — PANTOPRAZOLE SODIUM 40 MG PO TBEC
40.0000 mg | DELAYED_RELEASE_TABLET | Freq: Every day | ORAL | Status: DC
  Administered 2024-05-27 – 2024-05-29 (×3): 40 mg via ORAL
  Filled 2024-05-26 (×3): qty 1

## 2024-05-26 MED ORDER — FENTANYL CITRATE (PF) 50 MCG/ML IJ SOSY
25.0000 ug | PREFILLED_SYRINGE | INTRAMUSCULAR | Status: DC | PRN
  Administered 2024-05-26 – 2024-05-29 (×8): 50 ug via INTRAVENOUS
  Filled 2024-05-26 (×8): qty 1

## 2024-05-26 MED ORDER — SCOPOLAMINE 1 MG/3DAYS TD PT72
MEDICATED_PATCH | TRANSDERMAL | Status: AC
  Filled 2024-05-26: qty 1

## 2024-05-26 MED ORDER — LACTATED RINGERS IV SOLN: INTRAVENOUS | Status: DC | PRN

## 2024-05-26 MED ORDER — SODIUM CHLORIDE 0.9% IV SOLUTION
INTRAVENOUS | Status: AC | PRN
  Administered 2024-05-26: 1000 mL via INTRAMUSCULAR

## 2024-05-26 MED ORDER — ROCURONIUM BROMIDE 10 MG/ML (PF) SYRINGE
PREFILLED_SYRINGE | INTRAVENOUS | Status: DC | PRN
  Administered 2024-05-26: 20 mg via INTRAVENOUS
  Administered 2024-05-26: 10 mg via INTRAVENOUS
  Administered 2024-05-26: 70 mg via INTRAVENOUS

## 2024-05-26 MED ORDER — ATORVASTATIN CALCIUM 40 MG PO TABS
40.0000 mg | ORAL_TABLET | Freq: Every day | ORAL | Status: DC
  Administered 2024-05-27 – 2024-05-29 (×3): 40 mg via ORAL
  Filled 2024-05-26 (×3): qty 1

## 2024-05-26 MED ORDER — ACETAMINOPHEN 500 MG PO TABS
1000.0000 mg | ORAL_TABLET | Freq: Four times a day (QID) | ORAL | Status: DC
  Administered 2024-05-27 – 2024-05-28 (×7): 1000 mg via ORAL
  Filled 2024-05-26 (×8): qty 2

## 2024-05-26 MED ORDER — HYDROMORPHONE HCL 1 MG/ML IJ SOLN
INTRAMUSCULAR | Status: AC
  Filled 2024-05-26: qty 1

## 2024-05-26 MED ORDER — OXYCODONE HCL 5 MG/5ML PO SOLN
5.0000 mg | Freq: Once | ORAL | Status: AC | PRN
  Administered 2024-05-26: 5 mg via ORAL

## 2024-05-26 MED ORDER — METHOCARBAMOL 500 MG PO TABS
500.0000 mg | ORAL_TABLET | Freq: Four times a day (QID) | ORAL | Status: DC | PRN
  Administered 2024-05-26 – 2024-05-27 (×2): 500 mg via ORAL
  Filled 2024-05-26 (×2): qty 1

## 2024-05-26 MED ORDER — MIDAZOLAM HCL (PF) 2 MG/2ML IJ SOLN: 0.5000 mg | Freq: Once | INTRAMUSCULAR | Status: DC | PRN

## 2024-05-26 MED ORDER — SODIUM CHLORIDE FLUSH 0.9 % IV SOLN
INTRAVENOUS | Status: DC | PRN
  Administered 2024-05-26: 100 mL

## 2024-05-26 MED ORDER — ALBUMIN HUMAN 5 % IV SOLN: INTRAVENOUS | Status: DC | PRN

## 2024-05-26 MED ORDER — LISINOPRIL 5 MG PO TABS
5.0000 mg | ORAL_TABLET | Freq: Every day | ORAL | Status: DC
  Administered 2024-05-27 – 2024-05-29 (×3): 5 mg via ORAL
  Filled 2024-05-26 (×3): qty 1

## 2024-05-26 MED ORDER — SODIUM CHLORIDE (PF) 0.9 % IJ SOLN
INTRAMUSCULAR | Status: AC
  Filled 2024-05-26: qty 50

## 2024-05-26 MED ORDER — BUPIVACAINE HCL (PF) 0.5 % IJ SOLN
INTRAMUSCULAR | Status: AC
  Filled 2024-05-26: qty 30

## 2024-05-26 MED ORDER — TRAMADOL HCL 50 MG PO TABS: 50.0000 mg | ORAL_TABLET | Freq: Four times a day (QID) | ORAL | Status: DC | PRN

## 2024-05-26 MED ORDER — GABAPENTIN 400 MG PO CAPS
400.0000 mg | ORAL_CAPSULE | Freq: Three times a day (TID) | ORAL | Status: DC
  Filled 2024-05-26 (×3): qty 1

## 2024-05-26 MED ORDER — 0.9 % SODIUM CHLORIDE (POUR BTL) OPTIME
TOPICAL | Status: DC | PRN
  Administered 2024-05-26: 2000 mL

## 2024-05-26 MED ORDER — DEXAMETHASONE SOD PHOSPHATE PF 10 MG/ML IJ SOLN
INTRAMUSCULAR | Status: DC | PRN
  Administered 2024-05-26: 10 mg via INTRAVENOUS

## 2024-05-26 MED ORDER — ACETAMINOPHEN 160 MG/5ML PO SOLN: 1000.0000 mg | Freq: Four times a day (QID) | ORAL | Status: DC

## 2024-05-26 MED ORDER — OXYCODONE HCL 5 MG/5ML PO SOLN
ORAL | Status: AC
  Filled 2024-05-26: qty 5

## 2024-05-26 MED ORDER — ONDANSETRON HCL 4 MG/2ML IJ SOLN: 4.0000 mg | Freq: Four times a day (QID) | INTRAMUSCULAR | Status: DC | PRN

## 2024-05-26 MED ORDER — PROPOFOL 10 MG/ML IV BOLUS
INTRAVENOUS | Status: AC
  Filled 2024-05-26: qty 20

## 2024-05-26 MED ORDER — SCOPOLAMINE 1 MG/3DAYS TD PT72
MEDICATED_PATCH | TRANSDERMAL | Status: DC | PRN
  Administered 2024-05-26: 1 via TRANSDERMAL

## 2024-05-26 MED ORDER — INSULIN ASPART 100 UNIT/ML IJ SOLN
0.0000 [IU] | INTRAMUSCULAR | Status: DC | PRN
  Administered 2024-05-26: 2 [IU] via SUBCUTANEOUS
  Filled 2024-05-26: qty 2

## 2024-05-26 MED ORDER — CEFAZOLIN SODIUM-DEXTROSE 2-4 GM/100ML-% IV SOLN
2.0000 g | INTRAVENOUS | Status: AC
  Administered 2024-05-26: 2 g via INTRAVENOUS
  Filled 2024-05-26: qty 100

## 2024-05-26 MED ORDER — GLYCOPYRROLATE 0.2 MG/ML IJ SOLN
INTRAMUSCULAR | Status: DC | PRN
  Administered 2024-05-26: .2 mg via INTRAVENOUS

## 2024-05-26 MED ORDER — PROPOFOL 10 MG/ML IV BOLUS
INTRAVENOUS | Status: DC | PRN
  Administered 2024-05-26 (×2): 50 mg via INTRAVENOUS
  Administered 2024-05-26: 100 mg via INTRAVENOUS

## 2024-05-26 MED ORDER — LACTATED RINGERS IV SOLN: INTRAVENOUS | Status: DC

## 2024-05-26 MED ORDER — PHENYLEPHRINE HCL-NACL 20-0.9 MG/250ML-% IV SOLN
INTRAVENOUS | Status: DC | PRN
  Administered 2024-05-26: 15 ug/min via INTRAVENOUS

## 2024-05-26 MED ORDER — RISPERIDONE 2 MG PO TABS
2.0000 mg | ORAL_TABLET | Freq: Every day | ORAL | Status: DC
  Administered 2024-05-27 – 2024-05-28 (×2): 2 mg via ORAL
  Filled 2024-05-26 (×3): qty 1

## 2024-05-26 MED ORDER — EPHEDRINE SULFATE (PRESSORS) 25 MG/5ML IV SOSY
PREFILLED_SYRINGE | INTRAVENOUS | Status: DC | PRN
  Administered 2024-05-26: 2.5 mg via INTRAVENOUS

## 2024-05-26 MED ORDER — BUPIVACAINE LIPOSOME 1.3 % IJ SUSP
INTRAMUSCULAR | Status: AC
  Filled 2024-05-26: qty 20

## 2024-05-26 MED ORDER — BISACODYL 5 MG PO TBEC
10.0000 mg | DELAYED_RELEASE_TABLET | Freq: Every day | ORAL | Status: DC
  Administered 2024-05-27 – 2024-05-28 (×2): 10 mg via ORAL
  Filled 2024-05-26 (×2): qty 2

## 2024-05-26 MED ORDER — MEPERIDINE HCL 25 MG/ML IJ SOLN: 6.2500 mg | INTRAMUSCULAR | Status: DC | PRN

## 2024-05-26 MED ORDER — LIDOCAINE HCL (CARDIAC) PF 100 MG/5ML IV SOSY
PREFILLED_SYRINGE | INTRAVENOUS | Status: DC | PRN
  Administered 2024-05-26: 80 mg via INTRAVENOUS

## 2024-05-26 MED ORDER — ONDANSETRON HCL 4 MG/2ML IJ SOLN
INTRAMUSCULAR | Status: DC | PRN
  Administered 2024-05-26: 4 mg via INTRAVENOUS

## 2024-05-26 MED ORDER — INSULIN ASPART 100 UNIT/ML IJ SOLN
0.0000 [IU] | Freq: Three times a day (TID) | INTRAMUSCULAR | Status: DC
  Administered 2024-05-26 – 2024-05-27 (×4): 8 [IU] via SUBCUTANEOUS
  Administered 2024-05-27 – 2024-05-28 (×2): 2 [IU] via SUBCUTANEOUS
  Administered 2024-05-28: 4 [IU] via SUBCUTANEOUS
  Administered 2024-05-28: 8 [IU] via SUBCUTANEOUS
  Administered 2024-05-28: 4 [IU] via SUBCUTANEOUS
  Administered 2024-05-29: 2 [IU] via SUBCUTANEOUS
  Administered 2024-05-29: 4 [IU] via SUBCUTANEOUS
  Administered 2024-05-29: 8 [IU] via SUBCUTANEOUS
  Filled 2024-05-26 (×3): qty 8
  Filled 2024-05-26: qty 2
  Filled 2024-05-26: qty 8
  Filled 2024-05-26 (×3): qty 4
  Filled 2024-05-26 (×2): qty 8
  Filled 2024-05-26 (×2): qty 2

## 2024-05-26 MED ORDER — EZETIMIBE 10 MG PO TABS
10.0000 mg | ORAL_TABLET | Freq: Every day | ORAL | Status: DC
  Administered 2024-05-27 – 2024-05-29 (×3): 10 mg via ORAL
  Filled 2024-05-26 (×3): qty 1

## 2024-05-26 MED ORDER — SENNOSIDES-DOCUSATE SODIUM 8.6-50 MG PO TABS
1.0000 | ORAL_TABLET | Freq: Every day | ORAL | Status: DC
  Administered 2024-05-27 – 2024-05-28 (×2): 1 via ORAL
  Filled 2024-05-26 (×2): qty 1

## 2024-05-26 MED ORDER — FENTANYL CITRATE (PF) 250 MCG/5ML IJ SOLN
INTRAMUSCULAR | Status: DC | PRN
  Administered 2024-05-26: 150 ug via INTRAVENOUS
  Administered 2024-05-26 (×2): 100 ug via INTRAVENOUS
  Administered 2024-05-26 (×3): 50 ug via INTRAVENOUS

## 2024-05-26 MED ORDER — MIDAZOLAM HCL 2 MG/2ML IJ SOLN
INTRAMUSCULAR | Status: AC
  Filled 2024-05-26: qty 2

## 2024-05-26 MED ORDER — OXYCODONE HCL 5 MG PO TABS: 5.0000 mg | ORAL_TABLET | Freq: Once | ORAL | Status: AC | PRN

## 2024-05-26 MED ORDER — MIDAZOLAM HCL (PF) 2 MG/2ML IJ SOLN
INTRAMUSCULAR | Status: DC | PRN
  Administered 2024-05-26: 2 mg via INTRAVENOUS

## 2024-05-26 MED ORDER — SODIUM CHLORIDE 0.9 % IV SOLN: INTRAVENOUS | Status: AC

## 2024-05-26 MED ORDER — HYDROMORPHONE HCL 1 MG/ML IJ SOLN
0.2500 mg | INTRAMUSCULAR | Status: DC | PRN
  Administered 2024-05-26 (×2): 0.25 mg via INTRAVENOUS
  Administered 2024-05-26 (×3): 0.5 mg via INTRAVENOUS

## 2024-05-26 MED ORDER — OXYCODONE HCL 5 MG PO TABS
5.0000 mg | ORAL_TABLET | ORAL | Status: DC | PRN
  Administered 2024-05-26: 10 mg via ORAL
  Administered 2024-05-27: 5 mg via ORAL
  Administered 2024-05-27 – 2024-05-28 (×5): 10 mg via ORAL
  Administered 2024-05-28: 5 mg via ORAL
  Administered 2024-05-29 (×2): 10 mg via ORAL
  Filled 2024-05-26 (×7): qty 2
  Filled 2024-05-26: qty 1
  Filled 2024-05-26 (×3): qty 2

## 2024-05-26 MED ORDER — LEVOTHYROXINE SODIUM 175 MCG PO TABS
175.0000 ug | ORAL_TABLET | Freq: Every day | ORAL | Status: DC
  Administered 2024-05-27 – 2024-05-29 (×3): 175 ug via ORAL
  Filled 2024-05-26 (×4): qty 1

## 2024-05-26 MED ORDER — SUGAMMADEX SODIUM 200 MG/2ML IV SOLN
INTRAVENOUS | Status: DC | PRN
  Administered 2024-05-26: 200 mg via INTRAVENOUS

## 2024-05-26 SURGICAL SUPPLY — 62 items
CANISTER SUCTION 3000ML PPV (SUCTIONS) ×2 IMPLANT
CANNULA REDUCER 12-8 DVNC XI (CANNULA) ×2 IMPLANT
CNTNR URN SCR LID CUP LEK RST (MISCELLANEOUS) ×5 IMPLANT
CONN ST 1/4X3/8 BEN (MISCELLANEOUS) IMPLANT
DEFOGGER SCOPE WARM SEASHARP (MISCELLANEOUS) ×1 IMPLANT
DERMABOND ADVANCED .7 DNX12 (GAUZE/BANDAGES/DRESSINGS) ×1 IMPLANT
DRAIN CHANNEL 28F RND 3/8 FF (WOUND CARE) IMPLANT
DRAPE ARM DVNC X/XI (DISPOSABLE) ×4 IMPLANT
DRAPE COLUMN DVNC XI (DISPOSABLE) ×1 IMPLANT
DRAPE CV SPLIT W-CLR ANES SCRN (DRAPES) ×1 IMPLANT
DRAPE HALF SHEET 40X57 (DRAPES) IMPLANT
DRAPE INCISE IOBAN 66X45 STRL (DRAPES) IMPLANT
DRAPE SURG ORHT 6 SPLT 77X108 (DRAPES) ×1 IMPLANT
ELECT BLADE 6.5 EXT (BLADE) ×1 IMPLANT
ELECTRODE REM PT RTRN 9FT ADLT (ELECTROSURGICAL) ×1 IMPLANT
FORCEPS BPLR FENES DVNC XI (FORCEP) IMPLANT
FORCEPS BPLR LNG DVNC XI (INSTRUMENTS) IMPLANT
GAUZE KITTNER 4X5 RF (MISCELLANEOUS) ×2 IMPLANT
GAUZE SPONGE 4X4 12PLY STRL (GAUZE/BANDAGES/DRESSINGS) ×1 IMPLANT
GLOVE SS BIOGEL STRL SZ 7.5 (GLOVE) ×3 IMPLANT
GLOVE SURG POLYISO LF SZ8 (GLOVE) ×1 IMPLANT
GOWN STRL REUS W/ TWL LRG LVL3 (GOWN DISPOSABLE) ×2 IMPLANT
GOWN STRL REUS W/ TWL XL LVL3 (GOWN DISPOSABLE) ×2 IMPLANT
GOWN STRL REUS W/TWL 2XL LVL3 (GOWN DISPOSABLE) ×1 IMPLANT
GRASPER TIP-UP FEN DVNC XI (INSTRUMENTS) IMPLANT
HEMOSTAT SURGICEL 2X14 (HEMOSTASIS) ×3 IMPLANT
IRRIGATION STRYKERFLOW (MISCELLANEOUS) ×1 IMPLANT
KIT BASIN OR (CUSTOM PROCEDURE TRAY) ×1 IMPLANT
KIT TURNOVER KIT B (KITS) ×1 IMPLANT
NDL HYPO 25GX1X1/2 BEV (NEEDLE) ×1 IMPLANT
NDL SPNL 22GX3.5 QUINCKE BK (NEEDLE) ×1 IMPLANT
NEEDLE HYPO 25GX1X1/2 BEV (NEEDLE) ×1 IMPLANT
NEEDLE SPNL 22GX3.5 QUINCKE BK (NEEDLE) ×1 IMPLANT
PACK CHEST (CUSTOM PROCEDURE TRAY) ×1 IMPLANT
PAD ARMBOARD POSITIONER FOAM (MISCELLANEOUS) ×2 IMPLANT
PORT ACCESS TROCAR AIRSEAL 12 (TROCAR) ×1 IMPLANT
RELOAD STAPLE 45 2.5 WHT DVNC (STAPLE) IMPLANT
RELOAD STAPLE 45 3.5 BLU DVNC (STAPLE) IMPLANT
RELOAD STAPLE 45 4.3 GRN DVNC (STAPLE) IMPLANT
RELOAD STAPLE 45 4.6 BLK DVNC (STAPLE) IMPLANT
SEAL UNIV 5-12 XI (MISCELLANEOUS) ×4 IMPLANT
SET TRI-LUMEN FLTR TB AIRSEAL (TUBING) ×1 IMPLANT
SOLN 0.9% NACL POUR BTL 1000ML (IV SOLUTION) ×2 IMPLANT
SOLN STERILE WATER BTL 1000 ML (IV SOLUTION) ×2 IMPLANT
SOLUTION ELECTROSURG ANTI STCK (MISCELLANEOUS) ×1 IMPLANT
SPONGE TONSIL 1 RF SGL (DISPOSABLE) IMPLANT
STAPLER 45 SUREFORM CVD DVNC (STAPLE) IMPLANT
SUT PROLENE 4-0 RB1 .5 CRCL 36 (SUTURE) IMPLANT
SUT SILK 1 MH (SUTURE) ×1 IMPLANT
SUT SILK 2 0 SH (SUTURE) ×1 IMPLANT
SUT VIC AB 1 CTX36XBRD ANBCTR (SUTURE) ×1 IMPLANT
SUT VIC AB 2-0 CTX 36 (SUTURE) ×1 IMPLANT
SUT VIC AB 3-0 X1 27 (SUTURE) ×2 IMPLANT
SUT VICRYL 0 TIES 12 18 (SUTURE) ×1 IMPLANT
SUT VICRYL 0 UR6 27IN ABS (SUTURE) ×2 IMPLANT
SYR 20CC LL (SYRINGE) ×2 IMPLANT
SYSTEM RETRIEVAL ANCHOR 15 (MISCELLANEOUS) IMPLANT
SYSTEM SAHARA CHEST DRAIN ATS (WOUND CARE) ×1 IMPLANT
TAPE CLOTH 4X10 WHT NS (GAUZE/BANDAGES/DRESSINGS) ×1 IMPLANT
TAPE CLOTH SURG 4X10 WHT LF (GAUZE/BANDAGES/DRESSINGS) IMPLANT
TOWEL GREEN STERILE (TOWEL DISPOSABLE) ×1 IMPLANT
TRAY FOLEY MTR SLVR 16FR STAT (SET/KITS/TRAYS/PACK) ×1 IMPLANT

## 2024-05-26 NOTE — Transfer of Care (Signed)
 Immediate Anesthesia Transfer of Care Note  Patient: Rhonda Higgins  Procedure(s) Performed: LOBECTOMY, LUNG, ROBOT-ASSISTED, USING VATS (Left: Chest) BLOCK, NERVE, INTERCOSTAL (Left: Chest) BIOPSY, LYMPH NODE (Left: Chest)  Patient Location: PACU  Anesthesia Type:General  Level of Consciousness: awake, alert , oriented, and patient cooperative  Airway & Oxygen Therapy: Patient Spontanous Breathing and Patient connected to face mask oxygen  Post-op Assessment: Report given to RN and Post -op Vital signs reviewed and stable  Post vital signs: Reviewed and stable  Last Vitals:  Vitals Value Taken Time  BP 155/84 05/26/24 14:57  Temp    Pulse 105 05/26/24 15:00  Resp 15 05/26/24 15:00  SpO2 94 % 05/26/24 15:00  Vitals shown include unfiled device data.  Last Pain:  Vitals:   05/26/24 0837  TempSrc:   PainSc: 0-No pain      Patients Stated Pain Goal: 0 (05/26/24 0837)  Complications: No notable events documented.

## 2024-05-26 NOTE — Discharge Instructions (Signed)
 Discharge Instructions:  1. You may shower, please wash incisions daily with soap and water and keep dry.  If you wish to cover wounds with dressing you may do so but please keep clean and change daily.  No tub baths or swimming until incisions have completely healed.  If your incisions become red or develop any drainage please call our office at [phone]  2. No Driving until cleared by Dr. Audree Bless office and you are no longer using narcotic pain medications  3. Monitor your weight daily.. Please use the same scale and weigh at same time... If you gain 5-10 lbs in 48 hours with associated lower extremity swelling, please contact our office at [phone]  4. Fever of 101.5 for at least 24 hours with no source, please contact our office at [phone]  5. Activity- up as tolerated, please walk at least 3 times per day.  Avoid strenuous activity, no lifting, pushing, or pulling with your arms over 8-10 lbs for a minimum of 6 weeks  6. If any questions or concerns arise, please do not hesitate to contact our office at [phone]

## 2024-05-26 NOTE — Anesthesia Postprocedure Evaluation (Signed)
 Anesthesia Post Note  Patient: Rhonda Higgins  Procedure(s) Performed: LOBECTOMY, LUNG, ROBOT-ASSISTED, USING VATS (Left: Chest) BLOCK, NERVE, INTERCOSTAL (Left: Chest) BIOPSY, LYMPH NODE (Left: Chest)     Patient location during evaluation: PACU Anesthesia Type: General Level of consciousness: awake and alert, oriented and patient cooperative Pain management: pain level controlled Vital Signs Assessment: post-procedure vital signs reviewed and stable Respiratory status: spontaneous breathing, nonlabored ventilation, respiratory function stable and patient connected to nasal cannula oxygen Cardiovascular status: blood pressure returned to baseline and stable Postop Assessment: no apparent nausea or vomiting Anesthetic complications: no   No notable events documented.  Last Vitals:  Vitals:   05/26/24 1545 05/26/24 1600  BP: (!) 130/57 136/68  Pulse: 89 96  Resp: 15 17  Temp:    SpO2: 97% 97%    Last Pain:  Vitals:   05/26/24 1545  TempSrc:   PainSc: 9                  Daekwon Beswick,E. Brennin Durfee

## 2024-05-26 NOTE — Op Note (Unsigned)
 NAMEWENDOLYN, RASO MEDICAL RECORD NO: [id number] ACCOUNT NO: [id number] DATE OF BIRTH: 28-Jul-1970 FACILITY: [facility] LOCATION: [facility] PHYSICIAN: Elspeth BROCKS. Kerrin, MD  Operative Report   DATE OF PROCEDURE: 05/26/2024  PREOPERATIVE DIAGNOSIS:  Clinical stage IA carcinoid tumor, left lower lobe.  POSTOPERATIVE DIAGNOSIS:  Clinical stage IA carcinoid tumor, left lower lobe.  PROCEDURE:  Xi robotic-assisted left lower lobectomy, lymph node sampling, and intercostal nerve blocks levels 3-10.  SURGEON:  Elspeth BROCKS. Kerrin, MD.  ASSISTANT:  Laurel Becket, PA  Experienced assistance was necessary for this case due to the surgical complexity.  Laurel Becket assisted with port placement, robot docking and undocking, instrument exchange, specimen retrieval, suctioning, and wound closure.  ANESTHESIA:  General.  FINDINGS:  Carcinoid tumor clearly contained within the specimen.  Bronchial margin negative for tumor.  Normal appearing lymph nodes.  CLINICAL NOTE:  The patient is a 53 year old woman with a known carcinoid tumor of the left lower lobe.  She has had bronchoscopy and biopsy.  She was lost to follow-up, but recently had a scan which showed the nodule had increased in size slightly.  She  wished to have surgical removal.  She was aware that this was a benign appearing lesion with very slow growth that she likely could live with long term.  The indications, risks, benefits, and alternatives were discussed in detail with the patient.  She  understood and accepted the risks and agreed to proceed.  OPERATIVE NOTE:  The patient was brought to the operating room on 05/26/2024.  She had induction of general anesthesia and was intubated with a double-lumen endotracheal tube.  Intravenous antibiotics were administered.  A Foley catheter was placed.   Sequential compression devices were placed on the calves for DVT prophylaxis.  She was placed in a right lateral decubitus  position.  A Bair Hugger was placed for active warming.  The left chest was prepped and draped in the usual sterile fashion.  A timeout was performed.  A solution containing 20 mL of liposomal bupivacaine , 30 mL of 0.5% bupivacaine , and 50 mL of saline was prepared.  This solution was used for local at the incisions as well as for the intercostal nerve blocks.  An incision was  made in the eighth interspace.  The patient was on single lung ventilation of the right lung, which she tolerated well throughout the procedure.  An incision was made in the eighth interspace in approximately the midaxillary line.  An 8 mm robotic port  was inserted.  The thoracoscope was advanced into the chest.  After confirming adequate anticoagulation *** intrapleural placement, carbon dioxide was insufflated per protocol.  A 12 mm robotic port was placed in the eighth interspace anterior to the  camera port.  Intercostal nerve blocks were then performed from the 3rd to the 10th interspace by injecting 10 mL of the bupivacaine  solution into a subpleural plane at each level.  A 12 mm AirSeal port was placed in the tenth interspace posterolaterally  and two additional eighth interspace robotic ports were placed.  The robot was deployed and the camera arm was docked.  Targeting was performed.  The remaining arms were docked.  The robotic instruments were inserted with thoracoscopic visualization.  The fissure was relatively complete except for a small portion anteriorly.  The lung was retracted superiorly.  The inferior pulmonary ligament was divided up to the level of the inferior pulmonary vein.  All dissection was done with bipolar cautery.   Level 9 nodes were  removed.  The lung then was retracted anteriorly and the pleural reflection was divided at the hilum posteriorly.  Level 10 and 7 nodes were removed.  The pleura then was dissected off the pulmonary artery posteriorly.  Attention then  was turned anteriorly.  The hilum  had a significant amount of fatty tissue.  This was dissected off.  The inferior vein was cleared of attachments.  Working between the inferior and superior vein, the pleura was incised.  The pulmonary artery then was  identified in the fissure.  Fissure was essentially complete.  A plane was developed along the basilar segmental pulmonary artery and the anterior portion of the fissure was completed with the stapler.  The inferior vein then was divided using the  robotic stapler.  The superior segmental branch was encircled and divided and then the basilar segmental trunk was encircled and divided.  A stapler was placed across the left lower lobe bronchus at its origin and closed.  A test inflation showed good  aeration of the upper lobe.  The stapler was fired, transecting the left lower lobe bronchus.  The vessel loop and sponges were removed.  The chest was copiously irrigated with saline.  A test inflation to 30 cm of water  pressure revealed no air leaks  from the bronchial stump or the parenchyma.  The robotic instruments were removed and the robot was undocked.  The anterior eighth interspace incision was lengthened to 3 cm.  A 15 mm endoscopic retrieval bag was placed into the chest.  The lower lobe  was manipulated into the bag.  It was removed and sent for frozen section.  The nodule was clearly palpable in the specimen.  The bronchial margin returned with no tumor seen.  An inspection was made for hemostasis at the staple lines and then all the  port sites were inspected.  There was no ongoing bleeding.  A 28-French Blake drain was placed through the original port incision and directed to the apex.  It was secured with #1 silk suture.  Dual lung ventilation was resumed.  The remaining incisions  were closed in standard fashion.  Dermabond was applied.  The chest tube was placed to a Pleur-evac on water  seal.  The patient was placed in a supine position.  She was then extubated in the operating room  and taken to the post-anesthesia care unit in  good condition.   VAI D: 05/26/2024 3:42:39 pm T: 05/26/2024 10:33:00 pm  JOB: [id number]/ [id number]

## 2024-05-26 NOTE — Hospital Course (Addendum)
 History of Present Illness: Rhonda Higgins is a 53 year old woman with a history of obesity, endometrial cancer, cirrhosis, thrombocytopenia, hyperlipidemia, reflux, thyroidectomy for goiter, postoperative hypothyroidism, obstructive sleep apnea, insulin -dependent type 2 diabetes, depression, borderline personality disorder, and 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   She was found to have a lung nodule back in 2022.  She had a bronchoscopy and biopsy showed carcinoid tumor.  This was suppose to be followed radiographically, but she was lost to follow up. She recently was referred back to pulmonology for shortness of breath and CT scan showed the nodule increased in size from 1.3 to 1.5 cm in diameter.   She presents to the clinic today for pre-operative visit.  She is still experiencing shortness of breath mainly exertion. She describes this at baseline and has not worsened.  She denies coughing and wheezing.  She was seen by her cardiologist, Dr. Constance for her shortness of breath on exertion. She recently had a 2-D echo done which showed normal LV systolic function and no wall motion abnormalities. Her EKG showed normal sinus rhythm with RSR pattern in V1 and no new acute ischemic changes. EKG was unchanged from previous and was cleared for surgery. She denies chest pain, palpations, and lower leg swelling.    Dr. Kerrin revieweed the patient's diagnostic studies and determined she would benegit from surgical intervention. He reviewed the patient's treatment options as well as the risks and benefits of surgery. [name] was agreeable to proceed with surgery.   Hospital Course: [name] presented to [facility] and was brought to the operating room on 05/26/24. He underwent robot-assisted left lower lobectomy with mediastinal lymph node sampling and intercostal nerve block. Intra-operative frozen section by pathology revealed adequate tumor margin. She tolerated the  procedure well and was transferred to the PACU in stable condition.   Postoperative hospital course:  The patient has done well.  On postop day 1 she had a moderate amount of chest tube drainage with a small airleak and tube was kept in place.  Chest x-ray showed no evidence of pneumothorax and only a small amount of subcu air.  Blood sugars have been under reasonable control and she is being kept on the sliding scale with gradual transition to her home insulin  regimen.  She has an expected acute blood loss anemia which is minor and being monitored clinically.  She has a chronic thrombocytopenia which is being monitored clinically.  She is tolerating routine progression in terms of rehab modalities and pulmonary hygiene.  Pain has been under adequate control.

## 2024-05-26 NOTE — Interval H&P Note (Signed)
 History and Physical Interval Note:  Patient seen and examined, operative site marked For left lower lobectomy for carcinoid tumor Dr. Levern cleared for surgery from cardiac perspective  05/26/2024 11:30 AM  Rhonda Higgins  has presented today for surgery, with the diagnosis of LLL CARCINOID TUMOR.  The various methods of treatment have been discussed with the patient and family. After consideration of risks, benefits and other options for treatment, the patient has consented to  Procedure(s) with comments: LOBECTOMY, LUNG, ROBOT-ASSISTED, USING VATS (Left) - ROBOTIC LEFT LOWER LOBECTOMY as a surgical intervention.  The patient's history has been reviewed, patient examined, no change in status, stable for surgery.  I have reviewed the patient's chart and labs.  Questions were answered to the patient's satisfaction.     [name]

## 2024-05-26 NOTE — Anesthesia Procedure Notes (Signed)
 Arterial Line Insertion Performed by: Vertie Russell NOVAK, CRNA, CRNA  Patient location: Pre-op. Preanesthetic checklist: patient identified, IV checked, site marked, risks and benefits discussed, surgical consent, monitors and equipment checked, pre-op evaluation, timeout performed and anesthesia consent Lidocaine  1% used for infiltration Left, radial was placed Catheter size: 20 G Hand hygiene performed , maximum sterile barriers used  and Seldinger technique used Allen's test indicative of satisfactory collateral circulation Attempts: 1 (Unsuccessful attempt by different CRNA) Following insertion, dressing applied and Biopatch. Post procedure assessment: normal  Patient tolerated the procedure well with no immediate complications.

## 2024-05-26 NOTE — Brief Op Note (Addendum)
 05/26/2024  2:45 PM  PATIENT:  Rhonda Higgins  53 y.o. female  PRE-OPERATIVE DIAGNOSIS:  LEFT LOWER LOBE CARCINOID TUMOR- Clinical stage IA(T1,N0)  POST-OPERATIVE DIAGNOSIS:  LEFT LOWER LOBE CARCINOID TUMOR- Clinical stage IA(T1,N0)  PROCEDURE: LOBECTOMY, LUNG, ROBOT-ASSISTED, USING VATS (Left) - ROBOTIC LEFT LOWER LOBECTOMY BLOCK, NERVE, INTERCOSTAL (Left) BIOPSY, LYMPH NODE (Left)  SURGEON: Kerrin Elspeth BROCKS, MD  PHYSICIAN ASSISTANT: Lindia Click, PA  ASSISTANTS: Jones Cornwall, RN, Scrub Person         Williams, Jaime L, CST, Scrub Person   ANESTHESIA:   general  EBL:  100 mL   BLOOD ADMINISTERED:none  DRAINS: 69fr Blake drain in left pleural space  LOCAL MEDICATIONS USED:  Exparel  local and intercostal  SPECIMEN:  Left lower lung lobe and multiple lymph nodes  DISPOSITION OF SPECIMEN:  PATHOLOGY  COUNTS:  Correct  DICTATION: .Dragon Dictation  PLAN OF CARE: Admit to inpatient   PATIENT DISPOSITION:  PACU - hemodynamically stable.   Delay start of Pharmacological VTE agent (>24hrs) due to thrombocytopenia

## 2024-05-26 NOTE — Anesthesia Procedure Notes (Signed)
 Procedure Name: Intubation Date/Time: 05/26/2024 12:37 PM  Performed by: Cindie Donald CROME, CRNAPre-anesthesia Checklist: Patient identified, Emergency Drugs available, Suction available and Patient being monitored Patient Re-evaluated:Patient Re-evaluated prior to induction Oxygen Delivery Method: Circle System Utilized Preoxygenation: Pre-oxygenation with 100% oxygen Induction Type: IV induction Ventilation: Mask ventilation without difficulty, Oral airway inserted - appropriate to patient size and Two handed mask ventilation required Laryngoscope Size: Mac and 3 Grade View: Grade I Tube type: Oral Endobronchial tube: Left and Double lumen EBT and 35 Fr Number of attempts: 1 Airway Equipment and Method: Stylet and Oral airway Placement Confirmation: ETT inserted through vocal cords under direct vision, positive ETCO2 and breath sounds checked- equal and bilateral Secured at: 29 cm Tube secured with: Tape Dental Injury: Teeth and Oropharynx as per pre-operative assessment

## 2024-05-27 ENCOUNTER — Encounter (HOSPITAL_COMMUNITY): Payer: Self-pay | Admitting: Thoracic Surgery (Cardiothoracic Vascular Surgery)

## 2024-05-27 ENCOUNTER — Inpatient Hospital Stay (HOSPITAL_COMMUNITY)

## 2024-05-27 LAB — BASIC METABOLIC PANEL WITH GFR
Anion gap: 10 (ref 5–15)
BUN: 9 mg/dL (ref 6–20)
CO2: 23 mmol/L (ref 22–32)
Calcium: 7.5 mg/dL — ABNORMAL LOW (ref 8.9–10.3)
Chloride: 104 mmol/L (ref 98–111)
Creatinine, Ser: 0.88 mg/dL (ref 0.44–1.00)
GFR, Estimated: >60 mL/min (ref >60)
Glucose, Bld: 186 mg/dL — ABNORMAL HIGH (ref 70–99)
Potassium: 4.1 mmol/L (ref 3.5–5.1)
Sodium: 137 mmol/L (ref 135–145)

## 2024-05-27 LAB — CBC
HCT: 33.7 % — ABNORMAL LOW (ref 36.0–46.0)
Hemoglobin: 10.4 g/dL — ABNORMAL LOW (ref 12.0–15.0)
MCH: 24.1 pg — ABNORMAL LOW (ref 26.0–34.0)
MCHC: 30.9 g/dL (ref 30.0–36.0)
MCV: 78.2 fL — ABNORMAL LOW (ref 80.0–100.0)
Platelets: 75 K/uL — ABNORMAL LOW (ref 150–400)
RBC: 4.31 MIL/uL (ref 3.87–5.11)
RDW: 16.4 % — ABNORMAL HIGH (ref 11.5–15.5)
WBC: 4.7 K/uL (ref 4.0–10.5)
nRBC: 0 % (ref 0.0–0.2)

## 2024-05-27 LAB — GLUCOSE, CAPILLARY
Glucose-Capillary: 212 mg/dL — ABNORMAL HIGH (ref 70–99)
Glucose-Capillary: 159 mg/dL — ABNORMAL HIGH (ref 70–99)
Glucose-Capillary: 221 mg/dL — ABNORMAL HIGH (ref 70–99)
Glucose-Capillary: 226 mg/dL — ABNORMAL HIGH (ref 70–99)

## 2024-05-27 MED ORDER — INSULIN GLARGINE 100 UNIT/ML ~~LOC~~ SOLN
9.0000 [IU] | Freq: Every day | SUBCUTANEOUS | Status: DC
  Administered 2024-05-27: 9 [IU] via SUBCUTANEOUS
  Filled 2024-05-27 (×2): qty 0.09

## 2024-05-27 MED ORDER — LUNG SURGERY BOOK
Freq: Once | Status: AC
  Filled 2024-05-27: qty 1

## 2024-05-27 MED ORDER — ALUM & MAG HYDROXIDE-SIMETH 200-200-20 MG/5ML PO SUSP
30.0000 mL | Freq: Four times a day (QID) | ORAL | Status: DC | PRN
  Administered 2024-05-27: 30 mL via ORAL
  Filled 2024-05-27: qty 30

## 2024-05-27 NOTE — Progress Notes (Signed)
 Mobility Specialist Progress Note;   05/27/24 1510  Mobility  Activity Ambulated with assistance  Level of Assistance Standby assist, set-up cues, supervision of patient - no hands on  Assistive Device Front wheel walker  Distance Ambulated (ft) 400 ft  Activity Response Tolerated well  Mobility Referral Yes  Mobility visit 1 Mobility  Mobility Specialist Start Time (ACUTE ONLY) 1510  Mobility Specialist Stop Time (ACUTE ONLY) 1523  Mobility Specialist Time Calculation (min) (ACUTE ONLY) 13 min   Pt eager for mobility. Required no physical assistance during ambulation, SV for safety. VSS on RA. C/o mild pain at chest tube sit, otherwise asx. Returned pt back to bed and left with all needs met, call bell in reach.   Lauraine Erm Mobility Specialist Please contact via SecureChat or [employer] [phone]

## 2024-05-27 NOTE — Progress Notes (Signed)
 Mobility Specialist: Progress Note   05/27/24 1200  Mobility  Activity Ambulated with assistance  Level of Assistance Standby assist, set-up cues, supervision of patient - no hands on  Assistive Device Front wheel walker  Distance Ambulated (ft) 350 ft  Activity Response Tolerated well  Mobility Referral Yes  Mobility visit 1 Mobility  Mobility Specialist Start Time (ACUTE ONLY) 0950  Mobility Specialist Stop Time (ACUTE ONLY) 1006  Mobility Specialist Time Calculation (min) (ACUTE ONLY) 16 min    Pt received in BR, pleasant and agreeable to mobility session. SV throughout. C/o chest and back pain when she takes deep breaths. SpO2 WFL on RA. Returned to room. Left in bed with all needs met, call bell in reach.   Rhonda Higgins Mobility Specialist Please contact via SecureChat or Rehab office at [phone]

## 2024-05-27 NOTE — TOC CM/SW Note (Signed)
 Transition of Care [facility]) - Inpatient Brief Assessment   Patient Details  Name: Briyah Wheelwright Applegarth MRN: [id number] Date of Birth: May 03, 1971  Transition of Care [facility]) CM/SW Contact:    Lauraine FORBES Saa, LCSWA Phone Number: 05/27/2024, 9:18 AM   Clinical Narrative:  9:18 AM Per chart review, patient resides at home with spouse and child(ren). Patient has a PCP and insurance. Patient does not have SNF or HH history. Patient has DME (cane, CPAP) history with [employer] and [employer]. Patient's preferred pharmacy is [facility] [address]. No TOC needs identified at this time. TOC will continue to follow.    Transition of Care Asessment: Insurance and Status: Insurance coverage has been reviewed Patient has primary care physician: Yes Home environment has been reviewed: Private Residence Prior level of function:: N/A Prior/Current Home Services: No current home services Social Drivers of Health Review: SDOH reviewed no interventions necessary Readmission risk has been reviewed: Yes (Currently Green 12%) Transition of care needs: no transition of care needs at this time

## 2024-05-27 NOTE — Progress Notes (Addendum)
 1 Day Post-Op Procedure(s) (LRB): LOBECTOMY, LUNG, ROBOT-ASSISTED, USING VATS (Left) BLOCK, NERVE, INTERCOSTAL (Left) BIOPSY, LYMPH NODE (Left) Subjective: Some pain but mostly well controlled w/ current meds  Objective: Vital signs in last 24 hours: Temp:  [97.9 F (36.6 C)-99.1 F (37.3 C)] 97.9 F (36.6 C) (12/09 0312) Pulse Rate:  [59-106] 86 (12/09 0312) Cardiac Rhythm: Normal sinus rhythm (12/08 1900) Resp:  [9-21] 18 (12/09 0312) BP: (118-155)/(57-91) 123/77 (12/09 0312) SpO2:  [94 %-98 %] 96 % (12/09 0312) Arterial Line BP: (147-185)/(60-98) 147/65 (12/08 1800) Weight:  [98.4 kg] 98.4 kg (12/08 0824)  Hemodynamic parameters for last 24 hours:    Intake/Output from previous day: 12/08 0701 - 12/09 0700 In: 3320.2 [P.O.:840; I.V.:2130.2; IV Piggyback:350] Out: 2640 [Urine:2200; Blood:100; Chest Tube:340] Intake/Output this shift: Total I/O In: 170 [I.V.:170] Out: -   General appearance: alert, cooperative, and no distress Heart: regular rate and rhythm Lungs: min dim in bases Abdomen: benign Extremities: SCD's in place, no edema Wound: incis healing well  Lab Results: Recent Labs    05/27/24 0305  WBC 4.7  HGB 10.4*  HCT 33.7*  PLT 75*   BMET:  Recent Labs    05/27/24 0305  NA 137  K 4.1  CL 104  CO2 23  GLUCOSE 186*  BUN 9  CREATININE 0.88  CALCIUM  7.5*    PT/INR: No results for input(s): LABPROT, INR in the last 72 hours. ABG    Component Value Date/Time   HCO3 27.8 08/27/2021 2355   TCO2 24 02/08/2013 1318   O2SAT 99.5 08/27/2021 2355   CBG (last 3)  Recent Labs    05/26/24 1100 05/26/24 2150 05/27/24 0644  GLUCAP 147* 218* 212*    Meds Scheduled Meds:  acetaminophen   1,000 mg Oral Q6H   Or   acetaminophen  (TYLENOL ) oral liquid 160 mg/5 mL  1,000 mg Oral Q6H   atorvastatin   40 mg Oral Daily   bisacodyl   10 mg Oral Daily   ezetimibe   10 mg Oral Daily   FLUoxetine   20 mg Oral Daily   gabapentin   400 mg Oral TID    insulin  aspart  0-24 Units Subcutaneous TID AC & HS   levothyroxine   175 mcg Oral Daily   lisinopril   5 mg Oral Daily   metoCLOPramide  (REGLAN ) injection  10 mg Intravenous Q6H   pantoprazole   40 mg Oral Daily   risperiDONE   2 mg Oral QHS   senna-docusate  1 tablet Oral QHS   Continuous Infusions:  sodium chloride  75 mL/hr at 05/27/24 0716   PRN Meds:.fentaNYL  (SUBLIMAZE ) injection, methocarbamol , ondansetron  (ZOFRAN ) IV, oxyCODONE , traMADol   Xrays DG Chest Port 1 View Result Date: 05/27/2024 CLINICAL DATA:  Left lower lobectomy for carcinoid tumor. EXAM: PORTABLE CHEST 1 VIEW COMPARISON:  05/26/2024 FINDINGS: Low volume film. Cardiopericardial silhouette is at upper limits of normal for size. No pulmonary edema or pleural effusion. Streaky density at the right base suggest atelectasis. No discernible pneumothorax with left chest tube in situ. Soft tissue gas left chest wall is compatible with the presence of a thoracic drain. Telemetry leads overlie the chest. IMPRESSION: Low volume film without evidence for pneumothorax. Electronically Signed   By: Camellia Candle M.D.   On: 05/27/2024 06:51   DG Chest Port 1 View Result Date: 05/26/2024 CLINICAL DATA:  Postop EXAM: PORTABLE CHEST 1 VIEW COMPARISON:  05/22/2024, chest CT 03/13/2024 FINDINGS: Surgical hardware in the cervical spine. Clips at the thoracic inlet. Interval left lower lobectomy, placement of left-sided drainage  catheter with tip near the medial apex. No definitive pneumothorax. Cardiac size within normal limits. Small streaky foci of probable atelectasis in the right mid lung and right base. IMPRESSION: 1. Interval postoperative changes of the left thorax with placement of left-sided chest drainage catheter. No definitive pneumothorax is seen 2. Small streaky foci of probable atelectasis in the right mid lung and right base. Electronically Signed   By: Luke Bun M.D.   On: 05/26/2024 19:19    Assessment/Plan: S/P Procedure(s)  (LRB): LOBECTOMY, LUNG, ROBOT-ASSISTED, USING VATS (Left) BLOCK, NERVE, INTERCOSTAL (Left) BIOPSY, LYMPH NODE (Left) POD#1 Left lower lobectomy  1 Tmax 99.1, s BP 110's-150's, SR/SB 2 O2 sats good on 2 liters 3 I/O- excellent UOP 4 CT 340 ml/24 h recorded, small air leak- keep in place for today 5 CXR- no pntx, small amt of SQ air on right 6 BS- elevated 147-218 range- cont SSI, will order 1/2 dose home lantus  for now 7 normal renal fxn 8 expected ABLA- monitor, minor 9 thrombocytopenia- chronic , not on lovenox or torodol 10 cont routine pulm hygiene and rehab modalities    LOS: 1 day    Lemond FORBES Cera PA-C  05/27/2024 Patient seen and examined, agree with above Doing well Keep Ct on water  seal today Mobilize  Eufemio Strahm C. Kerrin, MD [facility] [phone]

## 2024-05-27 NOTE — Plan of Care (Signed)
  Problem: Education: Goal: Knowledge of General Education information will improve Description: Including pain rating scale, medication(s)/side effects and non-pharmacologic comfort measures Outcome: Progressing   Problem: Health Behavior/Discharge Planning: Goal: Ability to manage health-related needs will improve Outcome: Progressing   Problem: Clinical Measurements: Goal: Ability to maintain clinical measurements within normal limits will improve Outcome: Progressing Goal: Will remain free from infection Outcome: Progressing Goal: Diagnostic test results will improve Outcome: Progressing Goal: Respiratory complications will improve Outcome: Progressing Goal: Cardiovascular complication will be avoided Outcome: Progressing   Problem: Activity: Goal: Risk for activity intolerance will decrease Outcome: Progressing   Problem: Nutrition: Goal: Adequate nutrition will be maintained Outcome: Progressing   Problem: Coping: Goal: Level of anxiety will decrease Outcome: Progressing   Problem: Elimination: Goal: Will not experience complications related to bowel motility Outcome: Progressing Goal: Will not experience complications related to urinary retention Outcome: Progressing   Problem: Pain Managment: Goal: General experience of comfort will improve and/or be controlled Outcome: Progressing   Problem: Safety: Goal: Ability to remain free from injury will improve Outcome: Progressing   Problem: Skin Integrity: Goal: Risk for impaired skin integrity will decrease Outcome: Progressing   Problem: Education: Goal: Knowledge of disease or condition will improve Outcome: Progressing Goal: Knowledge of the prescribed therapeutic regimen will improve Outcome: Progressing   Problem: Activity: Goal: Risk for activity intolerance will decrease Outcome: Progressing   Problem: Cardiac: Goal: Will achieve and/or maintain hemodynamic stability Outcome: Progressing    Problem: Clinical Measurements: Goal: Postoperative complications will be avoided or minimized Outcome: Progressing   Problem: Respiratory: Goal: Respiratory status will improve Outcome: Progressing   Problem: Pain Management: Goal: Pain level will decrease Outcome: Progressing

## 2024-05-28 ENCOUNTER — Inpatient Hospital Stay (HOSPITAL_COMMUNITY)

## 2024-05-28 LAB — COMPREHENSIVE METABOLIC PANEL WITH GFR
ALT: 12 U/L (ref 0–44)
AST: 18 U/L (ref 15–41)
Albumin: 2.8 g/dL — ABNORMAL LOW (ref 3.5–5.0)
Alkaline Phosphatase: 45 U/L (ref 38–126)
Anion gap: 3 — ABNORMAL LOW (ref 5–15)
BUN: 10 mg/dL (ref 6–20)
CO2: 30 mmol/L (ref 22–32)
Calcium: 7.6 mg/dL — ABNORMAL LOW (ref 8.9–10.3)
Chloride: 108 mmol/L (ref 98–111)
Creatinine, Ser: 0.73 mg/dL (ref 0.44–1.00)
GFR, Estimated: >60 mL/min (ref >60)
Glucose, Bld: 128 mg/dL — ABNORMAL HIGH (ref 70–99)
Potassium: 3.7 mmol/L (ref 3.5–5.1)
Sodium: 141 mmol/L (ref 135–145)
Total Bilirubin: 0.8 mg/dL (ref 0.0–1.2)
Total Protein: 5.3 g/dL — ABNORMAL LOW (ref 6.5–8.1)

## 2024-05-28 LAB — CBC
HCT: 32.9 % — ABNORMAL LOW (ref 36.0–46.0)
Hemoglobin: 10 g/dL — ABNORMAL LOW (ref 12.0–15.0)
MCH: 24.3 pg — ABNORMAL LOW (ref 26.0–34.0)
MCHC: 30.4 g/dL (ref 30.0–36.0)
MCV: 79.9 fL — ABNORMAL LOW (ref 80.0–100.0)
Platelets: 75 K/uL — ABNORMAL LOW (ref 150–400)
RBC: 4.12 MIL/uL (ref 3.87–5.11)
RDW: 16.8 % — ABNORMAL HIGH (ref 11.5–15.5)
WBC: 5.5 K/uL (ref 4.0–10.5)
nRBC: 0 % (ref 0.0–0.2)

## 2024-05-28 LAB — GLUCOSE, CAPILLARY
Glucose-Capillary: 142 mg/dL — ABNORMAL HIGH (ref 70–99)
Glucose-Capillary: 175 mg/dL — ABNORMAL HIGH (ref 70–99)
Glucose-Capillary: 178 mg/dL — ABNORMAL HIGH (ref 70–99)
Glucose-Capillary: 206 mg/dL — ABNORMAL HIGH (ref 70–99)

## 2024-05-28 MED ORDER — GLUCERNA SHAKE PO LIQD: 237.0000 mL | Freq: Three times a day (TID) | ORAL | Status: DC

## 2024-05-28 MED ORDER — INSULIN GLARGINE 100 UNIT/ML ~~LOC~~ SOLN
18.0000 [IU] | Freq: Every day | SUBCUTANEOUS | Status: DC
  Administered 2024-05-28: 18 [IU] via SUBCUTANEOUS
  Filled 2024-05-28 (×2): qty 0.18

## 2024-05-28 MED ORDER — GLUCERNA PO LIQD: 237.0000 mL | Freq: Three times a day (TID) | ORAL | Status: DC

## 2024-05-28 NOTE — Progress Notes (Signed)
 Mobility Specialist: Progress Note   05/28/24 1157  Mobility  Activity Ambulated with assistance  Level of Assistance Standby assist, set-up cues, supervision of patient - no hands on  Assistive Device Front wheel walker  Distance Ambulated (ft) 300 ft  Activity Response Tolerated well  Mobility Referral Yes  Mobility visit 1 Mobility  Mobility Specialist Start Time (ACUTE ONLY) [id number]  Mobility Specialist Stop Time (ACUTE ONLY) 0901  Mobility Specialist Time Calculation (min) (ACUTE ONLY) 9 min    Pt received in bed, agreeable to mobility session. SV throughout. VSS on RA. Returned to room. Left on [facility] with all needs met, call bell in reach.   Ileana Lute Mobility Specialist Please contact via SecureChat or Rehab office at [phone]

## 2024-05-28 NOTE — Progress Notes (Addendum)
 2 Days Post-Op Procedure(s) (LRB): LOBECTOMY, LUNG, ROBOT-ASSISTED, USING VATS (Left) BLOCK, NERVE, INTERCOSTAL (Left) BIOPSY, LYMPH NODE (Left) Subjective: Mod pain, feels block has worn off, meds helping  Objective: Vital signs in last 24 hours: Temp:  [98.3 F (36.8 C)-98.9 F (37.2 C)] 98.9 F (37.2 C) (12/10 0455) Pulse Rate:  [63-95] 63 (12/10 0455) Cardiac Rhythm: Sinus bradycardia (12/10 0705) Resp:  [16-20] 16 (12/10 0455) BP: (94-125)/(50-65) 115/57 (12/10 0455) SpO2:  [90 %-98 %] 97 % (12/10 0455)  Hemodynamic parameters for last 24 hours:    Intake/Output from previous day: 12/09 0701 - 12/10 0700 In: 3100.6 [P.O.:2028; I.V.:1072.6] Out: 2600 [Urine:2300; Chest Tube:300] Intake/Output this shift: No intake/output data recorded.  General appearance: alert, cooperative, and no distress Heart: regular rate and rhythm Lungs: slightly coarse Abdomen: benign Extremities: no edema or calf tenderness Wound: incis healing well  Lab Results: Recent Labs    05/27/24 0305 05/28/24 0422  WBC 4.7 5.5  HGB 10.4* 10.0*  HCT 33.7* 32.9*  PLT 75* 75*   BMET:  Recent Labs    05/27/24 0305 05/28/24 0422  NA 137 141  K 4.1 3.7  CL 104 108  CO2 23 30  GLUCOSE 186* 128*  BUN 9 10  CREATININE 0.88 0.73  CALCIUM  7.5* 7.6*    PT/INR: No results for input(s): LABPROT, INR in the last 72 hours. ABG    Component Value Date/Time   HCO3 27.8 08/27/2021 2355   TCO2 24 02/08/2013 1318   O2SAT 99.5 08/27/2021 2355   CBG (last 3)  Recent Labs    05/27/24 1614 05/27/24 2120 05/28/24 0613  GLUCAP 221* 226* 142*    Meds Scheduled Meds:  acetaminophen   1,000 mg Oral Q6H   Or   acetaminophen  (TYLENOL ) oral liquid 160 mg/5 mL  1,000 mg Oral Q6H   atorvastatin   40 mg Oral Daily   bisacodyl   10 mg Oral Daily   ezetimibe   10 mg Oral Daily   FLUoxetine   20 mg Oral Daily   gabapentin   400 mg Oral TID   insulin  aspart  0-24 Units Subcutaneous TID AC & HS    insulin  glargine  9 Units Subcutaneous QHS   levothyroxine   175 mcg Oral Daily   lisinopril   5 mg Oral Daily   metoCLOPramide  (REGLAN ) injection  10 mg Intravenous Q6H   pantoprazole   40 mg Oral Daily   risperiDONE   2 mg Oral QHS   senna-docusate  1 tablet Oral QHS   Continuous Infusions: PRN Meds:.alum & mag hydroxide-simeth, fentaNYL  (SUBLIMAZE ) injection, methocarbamol , ondansetron  (ZOFRAN ) IV, oxyCODONE , traMADol   Xrays DG Chest 1 View Result Date: 05/28/2024 CLINICAL DATA:  Status post lobectomy. EXAM: CHEST  1 VIEW COMPARISON:  05/27/2024 FINDINGS: Low lung volumes. Increasing atelectasis at the right base. Retrocardiac atelectasis is similar with small left pleural effusion evident. No overt pulmonary edema. The cardio pericardial silhouette is enlarged. Soft tissue gas again noted left chest wall. Tiny left apical pneumothorax evident. Left chest tube remains in place. IMPRESSION: 1. Tiny left apical pneumothorax with left chest tube in situ. 2. Low lung volumes with increasing right base atelectasis. 3. Persistent retrocardiac atelectasis with small left pleural effusion. Electronically Signed   By: Camellia Candle M.D.   On: 05/28/2024 07:04   DG Chest Port 1 View Result Date: 05/27/2024 CLINICAL DATA:  Left lower lobectomy for carcinoid tumor. EXAM: PORTABLE CHEST 1 VIEW COMPARISON:  05/26/2024 FINDINGS: Low volume film. Cardiopericardial silhouette is at upper limits of normal for size.  No pulmonary edema or pleural effusion. Streaky density at the right base suggest atelectasis. No discernible pneumothorax with left chest tube in situ. Soft tissue gas left chest wall is compatible with the presence of a thoracic drain. Telemetry leads overlie the chest. IMPRESSION: Low volume film without evidence for pneumothorax. Electronically Signed   By: Camellia Candle M.D.   On: 05/27/2024 06:51   DG Chest Port 1 View Result Date: 05/26/2024 CLINICAL DATA:  Postop EXAM: PORTABLE CHEST 1 VIEW  COMPARISON:  05/22/2024, chest CT 03/13/2024 FINDINGS: Surgical hardware in the cervical spine. Clips at the thoracic inlet. Interval left lower lobectomy, placement of left-sided drainage catheter with tip near the medial apex. No definitive pneumothorax. Cardiac size within normal limits. Small streaky foci of probable atelectasis in the right mid lung and right base. IMPRESSION: 1. Interval postoperative changes of the left thorax with placement of left-sided chest drainage catheter. No definitive pneumothorax is seen 2. Small streaky foci of probable atelectasis in the right mid lung and right base. Electronically Signed   By: Luke Bun M.D.   On: 05/26/2024 19:19    Assessment/Plan: S/P Procedure(s) (LRB): LOBECTOMY, LUNG, ROBOT-ASSISTED, USING VATS (Left) BLOCK, NERVE, INTERCOSTAL (Left) BIOPSY, LYMPH NODE (Left) POD#2  1 afebrile, s BP 90's-110's, sinus rhythm/ sinus brady 2 sats good on 2 liters 3 chest tube 300 ml/24 h recorded, small air leak , sero sang- keep for now 4 good uop 5 CBG 142-226 range- cont SSI- increase insulin  to home dose 6 normal renal fxn 7 Ca++ 7.6- appears chronic, low albumin /serum protein- will add nutritional supplements- encourage healthy diet- she doesn't eat meat 8 expected ABLA- stable 9 chronic thrombocytopenia- stable w/ PLT 75 K 10 CXR- small left apical pntx, right basilar atx 11 cont pulm hygiene and rehab modalities    LOS: 2 days    Lemond FORBES Cera PA-C Pager [phone] 05/28/2024 No air leak when I examined her on 2 occasions this afternoon Will dc chest tube Ambulate Path still pending  Elspeth C. Kerrin, MD [facility] [phone]

## 2024-05-28 NOTE — Plan of Care (Signed)
  Problem: Education: Goal: Knowledge of General Education information will improve Description: Including pain rating scale, medication(s)/side effects and non-pharmacologic comfort measures Outcome: Progressing   Problem: Health Behavior/Discharge Planning: Goal: Ability to manage health-related needs will improve Outcome: Progressing   Problem: Clinical Measurements: Goal: Ability to maintain clinical measurements within normal limits will improve Outcome: Progressing Goal: Will remain free from infection Outcome: Progressing Goal: Diagnostic test results will improve Outcome: Progressing Goal: Respiratory complications will improve Outcome: Progressing Goal: Cardiovascular complication will be avoided Outcome: Progressing   Problem: Activity: Goal: Risk for activity intolerance will decrease Outcome: Progressing   Problem: Nutrition: Goal: Adequate nutrition will be maintained Outcome: Progressing   Problem: Coping: Goal: Level of anxiety will decrease Outcome: Progressing   Problem: Elimination: Goal: Will not experience complications related to bowel motility Outcome: Progressing Goal: Will not experience complications related to urinary retention Outcome: Progressing   Problem: Pain Managment: Goal: General experience of comfort will improve and/or be controlled Outcome: Progressing   Problem: Safety: Goal: Ability to remain free from injury will improve Outcome: Progressing   Problem: Skin Integrity: Goal: Risk for impaired skin integrity will decrease Outcome: Progressing   Problem: Education: Goal: Knowledge of disease or condition will improve Outcome: Progressing Goal: Knowledge of the prescribed therapeutic regimen will improve Outcome: Progressing   Problem: Activity: Goal: Risk for activity intolerance will decrease Outcome: Progressing   Problem: Cardiac: Goal: Will achieve and/or maintain hemodynamic stability Outcome: Progressing    Problem: Clinical Measurements: Goal: Postoperative complications will be avoided or minimized Outcome: Progressing   Problem: Respiratory: Goal: Respiratory status will improve Outcome: Progressing

## 2024-05-28 NOTE — Progress Notes (Signed)
 Mobility Specialist: Progress Note   05/28/24 1400  Mobility  Activity Ambulated with assistance  Level of Assistance Standby assist, set-up cues, supervision of patient - no hands on  Assistive Device Front wheel walker  Distance Ambulated (ft) 600 ft  Activity Response Tolerated well  Mobility Referral Yes  Mobility visit 1 Mobility  Mobility Specialist Start Time (ACUTE ONLY) 1410  Mobility Specialist Stop Time (ACUTE ONLY) 1420  Mobility Specialist Time Calculation (min) (ACUTE ONLY) 10 min    Pt received in bed, pleasant and agreeable to mobility session. SV throughout. SpO2 desat to 86% on RA at the end, pt c/o feeling SOB. Returned to room and replaced 2LO2 via [name]. Left in bed with all needs met, call bell in reach.   Ileana Lute Mobility Specialist Please contact via SecureChat or Rehab office at [phone]

## 2024-05-29 ENCOUNTER — Inpatient Hospital Stay (HOSPITAL_COMMUNITY)

## 2024-05-29 LAB — GLUCOSE, CAPILLARY
Glucose-Capillary: 147 mg/dL — ABNORMAL HIGH (ref 70–99)
Glucose-Capillary: 162 mg/dL — ABNORMAL HIGH (ref 70–99)
Glucose-Capillary: 218 mg/dL — ABNORMAL HIGH (ref 70–99)

## 2024-05-29 MED ORDER — METOCLOPRAMIDE HCL 10 MG PO TABS: 10.0000 mg | ORAL_TABLET | Freq: Four times a day (QID) | ORAL | Status: AC | PRN

## 2024-05-29 NOTE — Progress Notes (Signed)
°   05/29/24 1107  Therapy Vitals  Temp 99.4 F (37.4 C)  Temp Source Oral  Pulse Rate 80  Resp 16  BP 122/66  Patient Position (if appropriate) Lying  Oxygen Therapy  SpO2 93 %  Mobility  Activity Ambulated with assistance  Level of Assistance Standby assist, set-up cues, supervision of patient - no hands on  Assistive Device None  Distance Ambulated (ft) 150 ft  Activity Response Tolerated well  Mobility Referral Yes  Mobility visit 1 Mobility  Mobility Specialist Start Time (ACUTE ONLY) 0915  Mobility Specialist Stop Time (ACUTE ONLY) 0925  Mobility Specialist Time Calculation (min) (ACUTE ONLY) 10 min   Pt received in bed agreeable to mobility. Found on 2L/min SPO2 was 93%, attempted to ambulate on RA but d/t SPO2 dropping to 84% O2 flow increased to 2L/min, VSS. Distance limited d/t pain at incision site. Returned to room w/o fault. Left in bed w/ call bell and personal belongings in reach. All needs met.

## 2024-05-29 NOTE — Progress Notes (Signed)
° °  Durable Medical Equipment (From admission, onward)        Start     Ordered  05/29/24 0814  For home use only DME oxygen  Once      Question Answer Comment Length of Need 6 Months  Mode or (Route) Nasal cannula  Liters per Minute 2  Oxygen delivery system: Gas    05/29/24 0813         Billy Almarie POUR, RN  Registered Nurse Nursing   Progress Notes    Signed   Date of Service: 05/29/2024  9:25 AM   Signed      SATURATION QUALIFICATIONS: (This note is used to comply with regulatory documentation for home oxygen)   Patient Saturations on Room Air at Rest = 89%   Patient Saturations on Room Air while Ambulating = 85%   Patient Saturations on 2 Liters of oxygen while Ambulating = 93%

## 2024-05-29 NOTE — Progress Notes (Addendum)
 3 Days Post-Op Procedures (LRB): LOBECTOMY, LUNG, ROBOT-ASSISTED, USING VATS (Left) BLOCK, NERVE, INTERCOSTAL (Left) BIOPSY, LYMPH NODE (Left) Subjective: Pain is reasonably controlled but some difficulty when takes a deep breath  Objective: Vital signs in last 24 hours: Temp:  [98 F (36.7 C)-99.2 F (37.3 C)] 98 F (36.7 C) (12/11 0315) Pulse Rate:  [72-91] 91 (12/11 0315) Cardiac Rhythm: Normal sinus rhythm (12/11 0700) Resp:  [13-20] 13 (12/11 0315) BP: (92-135)/(60-76) 135/76 (12/11 0315) SpO2:  [95 %-98 %] 95 % (12/11 0315)  Hemodynamic parameters for last 24 hours:    Intake/Output from previous day: 12/10 0701 - 12/11 0700 In: 240 [P.O.:240] Out: 400 [Urine:300; Chest Tube:100] Intake/Output this shift: No intake/output data recorded.  General appearance: alert, cooperative, and no distress Heart: regular rate and rhythm Lungs: clear to auscultation bilaterally Abdomen: benign Extremities: no edema or calf tenderness Wound: dressings CDI  Lab Results: Recent Labs    05/27/24 0305 05/28/24 0422  WBC 4.7 5.5  HGB 10.4* 10.0*  HCT 33.7* 32.9*  PLT 75* 75*   BMET:  Recent Labs    05/27/24 0305 05/28/24 0422  NA 137 141  K 4.1 3.7  CL 104 108  CO2 23 30  GLUCOSE 186* 128*  BUN 9 10  CREATININE 0.88 0.73  CALCIUM  7.5* 7.6*    PT/INR: No results for input(s): LABPROT, INR in the last 72 hours. ABG    Component Value Date/Time   HCO3 27.8 08/27/2021 2355   TCO2 24 02/08/2013 1318   O2SAT 99.5 08/27/2021 2355   CBG (last 3)  Recent Labs    05/28/24 1733 05/28/24 2108 05/29/24 0556  GLUCAP 178* 206* 147*    Meds Scheduled Meds:  acetaminophen   1,000 mg Oral Q6H   Or   acetaminophen  (TYLENOL ) oral liquid 160 mg/5 mL  1,000 mg Oral Q6H   atorvastatin   40 mg Oral Daily   bisacodyl   10 mg Oral Daily   ezetimibe   10 mg Oral Daily   feeding supplement (GLUCERNA SHAKE)  237 mL Oral TID WC   FLUoxetine   20 mg Oral Daily   gabapentin    400 mg Oral TID   insulin  aspart  0-24 Units Subcutaneous TID AC & HS   insulin  glargine  18 Units Subcutaneous QHS   levothyroxine   175 mcg Oral Daily   lisinopril   5 mg Oral Daily   metoCLOPramide  (REGLAN ) injection  10 mg Intravenous Q6H   pantoprazole   40 mg Oral Daily   risperiDONE   2 mg Oral QHS   senna-docusate  1 tablet Oral QHS   Continuous Infusions: PRN Meds:.alum & mag hydroxide-simeth, fentaNYL  (SUBLIMAZE ) injection, methocarbamol , ondansetron  (ZOFRAN ) IV, oxyCODONE , traMADol   Xrays DG Chest 1V REPEAT Same Day Result Date: 05/28/2024 EXAM: 1 VIEW(S) XRAY OF THE CHEST 05/28/2024 04:20:00 PM COMPARISON: Prior CXR dated 05/28/2024 and CT Chest dated 03/13/2024. CLINICAL HISTORY: Encounter for chest tube removal. FINDINGS: LUNGS AND PLEURA: Stable bibasilar airspace opacities. Stable to slightly increased trace left apical pneumothorax. No pleural effusion. HEART AND MEDIASTINUM: Unchanged cardiomediastinal silhouette. Atherosclerotic plaque. BONES AND SOFT TISSUES: Cervical spinal fusion hardware in place. Surgical clips in right neck. Left chest wall subcutaneous emphysema. No acute osseous abnormality. IMPRESSION: 1. Stable to slightly increased trace left apical pneumothorax. PRA call report. 2. Left chest wall subcutaneous emphysema. Electronically signed by: Morgane Naveau MD 05/28/2024 07:39 PM EST RP Workstation: [id number]   DG Chest 1 View Result Date: 05/28/2024 CLINICAL DATA:  Status post lobectomy. EXAM: CHEST  1 VIEW  COMPARISON:  05/27/2024 FINDINGS: Low lung volumes. Increasing atelectasis at the right base. Retrocardiac atelectasis is similar with small left pleural effusion evident. No overt pulmonary edema. The cardio pericardial silhouette is enlarged. Soft tissue gas again noted left chest wall. Tiny left apical pneumothorax evident. Left chest tube remains in place. IMPRESSION: 1. Tiny left apical pneumothorax with left chest tube in situ. 2. Low lung volumes with  increasing right base atelectasis. 3. Persistent retrocardiac atelectasis with small left pleural effusion. Electronically Signed   By: Camellia Candle M.D.   On: 05/28/2024 07:04    Assessment/Plan: S/P Procedures (LRB): LOBECTOMY, LUNG, ROBOT-ASSISTED, USING VATS (Left) BLOCK, NERVE, INTERCOSTAL (Left) BIOPSY, LYMPH NODE (Left) POD#3  1 afeb, VSS, NSR 2 sats good on 2 liters, walked on RA yesterday, took O2 off at beginning of my visit and sats dropped to 86%- replaced 3 voiding- not all measured 4 BS 142-207 range- back in home insulin  dosing- will need good outpatient management 5 path pending 6 CXR  from yesterday following tube removal, small stable left apical pntx- today's pending 7 cont to work on [employer] and mobility- will recheck later today as she appears close to D/C- could potentially need short term home O2    LOS: 3 days    Lemond FORBES Cera PA-C Pager [phone] 05/29/2024 Just back from Xray, poor quality film but don't see any major issues Requiring 1L [place]-  Possibly home later today or tomorrow, may need short term home O2 Path still pending  Elspeth C. Kerrin, MD [facility] [phone]

## 2024-05-29 NOTE — Plan of Care (Signed)
  Problem: Education: Goal: Knowledge of General Education information will improve Description: Including pain rating scale, medication(s)/side effects and non-pharmacologic comfort measures Outcome: Progressing   Problem: Health Behavior/Discharge Planning: Goal: Ability to manage health-related needs will improve Outcome: Progressing   Problem: Clinical Measurements: Goal: Ability to maintain clinical measurements within normal limits will improve Outcome: Progressing Goal: Will remain free from infection Outcome: Progressing Goal: Diagnostic test results will improve Outcome: Progressing Goal: Respiratory complications will improve Outcome: Progressing Goal: Cardiovascular complication will be avoided Outcome: Progressing   Problem: Activity: Goal: Risk for activity intolerance will decrease Outcome: Progressing   Problem: Nutrition: Goal: Adequate nutrition will be maintained Outcome: Progressing   Problem: Coping: Goal: Level of anxiety will decrease Outcome: Progressing   Problem: Elimination: Goal: Will not experience complications related to bowel motility Outcome: Progressing Goal: Will not experience complications related to urinary retention Outcome: Progressing   Problem: Pain Managment: Goal: General experience of comfort will improve and/or be controlled Outcome: Progressing   Problem: Safety: Goal: Ability to remain free from injury will improve Outcome: Progressing   Problem: Skin Integrity: Goal: Risk for impaired skin integrity will decrease Outcome: Progressing   Problem: Education: Goal: Knowledge of disease or condition will improve Outcome: Progressing Goal: Knowledge of the prescribed therapeutic regimen will improve Outcome: Progressing   Problem: Activity: Goal: Risk for activity intolerance will decrease Outcome: Progressing   Problem: Cardiac: Goal: Will achieve and/or maintain hemodynamic stability Outcome: Progressing    Problem: Clinical Measurements: Goal: Postoperative complications will be avoided or minimized Outcome: Progressing   Problem: Respiratory: Goal: Respiratory status will improve Outcome: Progressing

## 2024-05-29 NOTE — TOC Initial Note (Signed)
 Transition of Care [facility]) - Initial/Assessment Note    Patient Details  Name: Rhonda Higgins MRN: [id number] Date of Birth: 14-Feb-1971  [facility]) CM/SW Contact:    Roxie KANDICE Stain, RN Phone Number: 05/29/2024, 4:04 PM  Clinical Narrative:                 Spoke to patient regarding home 02 order.  Patient is agreeable to use in house provider [employer] for home 02 order.  Notified Zack with [employer] of home 02 order.  ICM (Inpatient Care Management) will continue to follow for needs.    Expected Discharge Plan: Home/Self Care Barriers to Discharge: Continued Medical Work up   Patient Goals and CMS Choice Patient states their goals for this hospitalization and ongoing recovery are:: return home CMS Medicare.gov Compare Post Acute Care list provided to:: Patient Choice offered to / list presented to : Patient      Expected Discharge Plan and Services   Discharge Planning Services: CM Consult Post Acute Care Choice: Durable Medical Equipment Living arrangements for the past 2 months: Single Family Home                 DME Arranged: Oxygen DME Agency: [employer] Date DME Agency Contacted: 05/29/24 Time DME Agency Contacted: [phone] Representative spoke with at DME Agency: Zack            Prior Living Arrangements/Services Living arrangements for the past 2 months: Single Family Home Lives with:: Spouse Patient language and need for interpreter reviewed:: Yes Do you feel safe going back to the place where you live?: Yes      Need for Family Participation in Patient Care: Yes (Comment) Care giver support system in place?: Yes (comment)   Criminal Activity/Legal Involvement Pertinent to Current Situation/Hospitalization: No - Comment as needed  Activities of Daily Living   ADL Screening (condition at time of admission) Independently performs ADLs?: Yes (appropriate for developmental age) Is the patient deaf or have difficulty hearing?: No Does the  patient have difficulty seeing, even when wearing glasses/contacts?: No Does the patient have difficulty concentrating, remembering, or making decisions?: No  Permission Sought/Granted         Permission granted to share info w AGENCY: [employer]        Emotional Assessment Appearance:: Appears stated age Attitude/Demeanor/Rapport: Engaged Affect (typically observed): Accepting Orientation: : Oriented to Self, Oriented to Place, Oriented to  Time, Oriented to Situation Alcohol / Substance Use: Not Applicable Psych Involvement: No (comment)  Admission diagnosis:  Carcinoid tumor of lung, unspecified whether malignant (HCC) [D3A.090] Status post robot-assisted surgical procedure [Z98.890] S/P lobectomy of lung [Z90.2] Patient Active Problem List   Diagnosis Date Noted   Status post robot-assisted surgical procedure 05/26/2024   S/P lobectomy of lung 05/26/2024   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   Radiculopathy, cervical region 01/26/2023   History of adenomatous polyp of colon 01/17/2022   Constipation 01/17/2022   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   Thrombocytopenia 09/09/2021   Iron  deficiency anemia due to chronic blood loss 06/24/2021   Status post bronchoscopy 04/20/2021   Pulmonary nodule 1 cm or greater in diameter 03/17/2021   Elevated levels of transaminase & lactic acid dehydrogenase 08/03/2020   Epigastric pain 08/03/2020   Liver nodule 08/03/2020   Obstruction of bile duct (HCC) 08/03/2020   Cirrhosis of liver (HCC) 07/29/2020   Gastroparesis 07/29/2020   Irritable bowel syndrome 07/29/2020   Steatosis of liver 07/29/2020   MDD (major depressive disorder),  recurrent episode, severe (HCC) 04/01/2020   Borderline personality disorder (HCC) 04/01/2020   Intermittent explosive disorder in adult 04/01/2020   Cholelithiasis with chronic cholecystitis 09/06/2017   Multinodular goiter (nontoxic) 11/17/2015   Diabetes mellitus without  complication (HCC) 07/19/2014   GERD (gastroesophageal reflux disease) 07/19/2014   HLD (hyperlipidemia) 02/09/2010   Obstructive sleep apnea 02/09/2010   Allergic rhinitis 02/09/2010   History of uterine cancer 02/09/2010   PCP:  Joshua Francisco, MD Pharmacy:   [id number] [place], [place] - [address] [place] [address] Phone: [phone] Fax: [phone]     Social Drivers of Health (SDOH) Social History: SDOH Screenings   Food Insecurity: No Food Insecurity (05/27/2024)  Recent Concern: Food Insecurity - Food Insecurity Present (05/04/2024)   Received from [facility]  Housing: Low Risk (05/27/2024)  Transportation Needs: No Transportation Needs (05/27/2024)  Utilities: Not At Risk (05/27/2024)  Depression (PHQ2-9): Low Risk (05/08/2024)  Financial Resource Strain: Medium Risk (05/04/2024)   Received from [facility]  Physical Activity: Inactive (05/04/2024)   Received from [facility]  Social Connections: Moderately Integrated (05/04/2024)   Received from [facility]  Stress: Stress Concern Present (05/04/2024)   Received from [facility]  Tobacco Use: Low Risk (05/26/2024)   SDOH Interventions:     Readmission Risk Interventions     No data to display

## 2024-05-29 NOTE — Discharge Summary (Cosign Needed)
 [address] [place] [address]             [phone]        Physician Discharge Summary  Patient ID: Rhonda Higgins MRN: [id number] DOB/AGE: June 27, 1970 53 y.o.  Admit date: 05/26/2024 Discharge date: 05/29/2024  Admission Diagnoses:  Patient Active Problem List   Diagnosis Date Noted   Status post robot-assisted surgical procedure 05/26/2024   S/P lobectomy of lung 05/26/2024   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   Radiculopathy, cervical region 01/26/2023   History of adenomatous polyp of colon 01/17/2022   Constipation 01/17/2022   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   Thrombocytopenia 09/09/2021   Iron  deficiency anemia due to chronic blood loss 06/24/2021   Status post bronchoscopy 04/20/2021   Pulmonary nodule 1 cm or greater in diameter 03/17/2021   Elevated levels of transaminase & lactic acid dehydrogenase 08/03/2020   Epigastric pain 08/03/2020   Liver nodule 08/03/2020   Obstruction of bile duct (HCC) 08/03/2020   Cirrhosis of liver (HCC) 07/29/2020   Gastroparesis 07/29/2020   Irritable bowel syndrome 07/29/2020   Steatosis of liver 07/29/2020   MDD (major depressive disorder), recurrent episode, severe (HCC) 04/01/2020   Borderline personality disorder (HCC) 04/01/2020   Intermittent explosive disorder in adult 04/01/2020   Cholelithiasis with chronic cholecystitis 09/06/2017   Multinodular goiter (nontoxic) 11/17/2015   Diabetes mellitus without complication (HCC) 07/19/2014   GERD (gastroesophageal reflux disease) 07/19/2014   HLD (hyperlipidemia) 02/09/2010   Obstructive sleep apnea 02/09/2010   Allergic rhinitis 02/09/2010   History of uterine cancer 02/09/2010     Discharge Diagnoses:  Patient Active Problem List   Diagnosis Date Noted   Status post robot-assisted surgical procedure 05/26/2024   S/P lobectomy of lung 05/26/2024   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    Radiculopathy, cervical region 01/26/2023   History of adenomatous polyp of colon 01/17/2022   Constipation 01/17/2022   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   Thrombocytopenia 09/09/2021   Iron  deficiency anemia due to chronic blood loss 06/24/2021   Status post bronchoscopy 04/20/2021   Pulmonary nodule 1 cm or greater in diameter 03/17/2021   Elevated levels of transaminase & lactic acid dehydrogenase 08/03/2020   Epigastric pain 08/03/2020   Liver nodule 08/03/2020   Obstruction of bile duct (HCC) 08/03/2020   Cirrhosis of liver (HCC) 07/29/2020   Gastroparesis 07/29/2020   Irritable bowel syndrome 07/29/2020   Steatosis of liver 07/29/2020   MDD (major depressive disorder), recurrent episode, severe (HCC) 04/01/2020   Borderline personality disorder (HCC) 04/01/2020   Intermittent explosive disorder in adult 04/01/2020   Cholelithiasis with chronic cholecystitis 09/06/2017   Multinodular goiter (nontoxic) 11/17/2015   Diabetes mellitus without complication (HCC) 07/19/2014   GERD (gastroesophageal reflux disease) 07/19/2014   HLD (hyperlipidemia) 02/09/2010   Obstructive sleep apnea 02/09/2010   Allergic rhinitis 02/09/2010   History of uterine cancer 02/09/2010     Discharged Condition: Stable  History of Present Illness: Rhonda Higgins is a 53 year old woman with a history of obesity, endometrial cancer, cirrhosis, thrombocytopenia, hyperlipidemia, reflux, thyroidectomy for goiter, postoperative hypothyroidism, obstructive sleep apnea, insulin -dependent type 2 diabetes, depression, borderline personality disorder, and 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   She was found to have a lung nodule back in 2022.  She had a bronchoscopy and biopsy showed carcinoid tumor.  This was suppose to be followed radiographically, but she was lost to follow up. She  recently was referred back to pulmonology for shortness of breath and CT scan showed the nodule increased in size  from 1.3 to 1.5 cm in diameter.   She presents to the clinic today for pre-operative visit.  She is still experiencing shortness of breath mainly exertion. She describes this at baseline and has not worsened.  She denies coughing and wheezing.  She was seen by her cardiologist, Dr. Constance for her shortness of breath on exertion. She recently had a 2-D echo done which showed normal LV systolic function and no wall motion abnormalities. Her EKG showed normal sinus rhythm with RSR pattern in V1 and no new acute ischemic changes. EKG was unchanged from previous and was cleared for surgery. She denies chest pain, palpations, and lower leg swelling.    Dr. Kerrin revieweed the patient's diagnostic studies and determined she would benegit from surgical intervention. He reviewed the patient's treatment options as well as the risks and benefits of surgery. [name] was agreeable to proceed with surgery.   Hospital Course: [name] presented to [facility] and was brought to the operating room on 05/26/24. He underwent robot-assisted left lower lobectomy with mediastinal lymph node sampling and intercostal nerve block. Intra-operative frozen section by pathology revealed adequate tumor margin. She tolerated the procedure well and was transferred to the PACU in stable condition.   Postoperative hospital course:  The patient has done well.  On postop day 1 she had a moderate amount of chest tube drainage with a small airleak and tube was kept in place.  Chest x-ray showed no evidence of pneumothorax and only a small amount of subcutaneous air. Blood sugars have been under reasonable control and she is being kept on the sliding scale with gradual transition to her home insulin  regimen.  She has an expected acute blood loss anemia which is minor and being monitored clinically.  She has a chronic thrombocytopenia which is being monitored clinically.  She is tolerating routine progression in  terms of rehab modalities and pulmonary hygiene.  Pain has been under adequate control. Air leak resolved on postop day #2. Chest tube was removed without complication and follow up CXR showed stable tiny left apical pneumothorax.  Patient has had some intermittent oxygen requirements for decreased saturations. Home oxygen has been arranged. She has been tolerating a diet.  Please note, patient recently (05/22/2024) received 90 of Norco (has chronic pain) and is already on Neurontin . She was not given any additional pain medication at discharge. Dressing removed wounds are clean, dry, healing without signs of infection. There is no drainage from chest tube wound. I reviewed discharge instructions with patient. As discussed with Dr. Kerrin, she is stable for discharge today.    Consults: None  Significant Diagnostic Studies:  Narrative & Impression  EXAM: 1 VIEW(S) XRAY OF THE CHEST 05/28/2024 04:20:00 PM   COMPARISON: Prior CXR dated 05/28/2024 and CT Chest dated 03/13/2024.   CLINICAL HISTORY: Encounter for chest tube removal.   FINDINGS:   LUNGS AND PLEURA: Stable bibasilar airspace opacities. Stable to slightly increased trace left apical pneumothorax. No pleural effusion.   HEART AND MEDIASTINUM: Unchanged cardiomediastinal silhouette. Atherosclerotic plaque.   BONES AND SOFT TISSUES: Cervical spinal fusion hardware in place. Surgical clips in right neck. Left chest wall subcutaneous emphysema. No acute osseous abnormality.   IMPRESSION: 1. Stable to slightly increased trace left apical pneumothorax. PRA call report. 2. Left chest wall subcutaneous emphysema.   Electronically signed by: Morgane Naveau MD 05/28/2024 07:39  PM EST RP Workstation: [id number]      Treatments: surgery:  Xi robotic-assisted left lower lobectomy, lymph node sampling, and intercostal nerve blocks levels 3-10 by Dr. Kerrin on 05/26/2024.  Pathology:  FINAL MICROSCOPIC DIAGNOSIS:   A.  LUNG, LEFT LOWER LOBE, LOBECTOMY:  - Well-differentiated neuroendocrine tumor, WHO grade 1  - Margins negative for malignancy  - No lymphovascular invasion identified  -See oncology table   B. LYMPH NODE, 9, EXCISION:  - One anthracotic lymph node, negative for carcinoma (0/1)   C. LYMPH NODE, 9 #2, EXCISION:  - One anthracotic lymph node, negative for carcinoma (0/1)   D. LYMPH NODE, 10, EXCISION:  - One anthracotic lymph node, negative for carcinoma (0/1)   E. LYMPH NODE, 7, EXCISION:  - One anthracotic lymph node, negative for carcinoma (0/1)   F. LYMPH NODE, 11, EXCISION:  - One anthracotic lymph node, negative for carcinoma (0/1)   TNM Code: PT1c, pN 0    Discharge Exam: Blood pressure 122/71, pulse 91, temperature 98.8 F (37.1 C), temperature source Oral, resp. rate 20, height 5' 3 (1.6 m), weight 98.4 kg, SpO2 95%.  General appearance: alert, cooperative, and no distress Heart: regular rate and rhythm Lungs: clear to auscultation bilaterally Abdomen: benign Extremities: no edema or calf tenderness Wound: dressings CDI  Discharge Medications:  Allergies as of 05/29/2024       Reactions   Tape Rash   Paper tape is ok   Wound Dressing Adhesive Rash   Pholcodine Itching   Semaglutide Nausea And Vomiting   Bactoshield Chg [chlorhexidine  Gluconate] Itching   Severe itching and redness, stings   Codeine Itching        Medication List     TAKE these medications    Accu-Chek Guide test strip Generic drug: glucose blood   Accu-Chek Softclix Lancets lancets daily.   Advair  HFA 230-21 MCG/ACT inhaler Generic drug: fluticasone-salmeterol Inhale 2 puffs into the lungs 2 (two) times daily.   atorvastatin  40 MG tablet Commonly known as: LIPITOR Take 40 mg by mouth daily.   dicyclomine 20 MG tablet Commonly known as: BENTYL Take 20 mg by mouth 4 (four) times daily as needed for spasms.   ezetimibe  10 MG tablet Commonly known as: ZETIA  Take 10 mg by  mouth daily.   FLUoxetine  20 MG capsule Commonly known as: PROZAC  Take 20 mg by mouth daily.   gabapentin  400 MG capsule Commonly known as: NEURONTIN  Take 400-1,200 mg by mouth See admin instructions. Takes 400 mg in the morning 400 mg in the afternoon and 400 mg at night   HYDROcodone -acetaminophen  5-325 MG tablet Commonly known as: NORCO/VICODIN Take 1 tablet by mouth in the morning, at noon, and at bedtime.   hydrOXYzine  25 MG tablet Commonly known as: ATARAX  Take 25 mg by mouth 3 (three) times daily as needed for anxiety.   IBgard 90 MG Cpcr Generic drug: Peppermint Oil Take 2 capsules by mouth daily.   insulin  glargine 100 UNIT/ML injection Commonly known as: LANTUS  Inject 18 Units into the skin at bedtime.   Insulin  Pen Needle 32G X 4 MM Misc 1 each by Does not apply route.   linagliptin  5 MG Tabs tablet Commonly known as: TRADJENTA  Take 5 mg by mouth daily.   lisinopril  5 MG tablet Commonly known as: ZESTRIL  Take 5 mg by mouth daily.   metFORMIN  500 MG 24 hr tablet Commonly known as: GLUCOPHAGE -XR Take 500 mg by mouth 2 (two) times daily.   metoCLOPramide  10 MG  tablet Commonly known as: REGLAN  Take 1 tablet (10 mg total) by mouth every 6 (six) hours as needed for vomiting or nausea.   naloxone 4 MG/0.1ML Liqd nasal spray kit Commonly known as: NARCAN SMARTSIG:Both Nares   ondansetron  4 MG disintegrating tablet Commonly known as: ZOFRAN -ODT Take 4 mg by mouth every 6 (six) hours as needed for vomiting or nausea.   pantoprazole  40 MG tablet Commonly known as: PROTONIX  Take 40 mg by mouth 2 (two) times daily.   promethazine  12.5 MG tablet Commonly known as: PHENERGAN  Take 12.5 mg by mouth every 6 (six) hours as needed for vomiting or nausea.   risperiDONE  2 MG tablet Commonly known as: RISPERDAL  Take 2 mg by mouth at bedtime.   sucralfate  1 g tablet Commonly known as: Carafate  Take 1 tablet (1 g total) by mouth 4 (four) times daily -  with meals  and at bedtime.   Synthroid  175 MCG tablet Generic drug: levothyroxine  Take 175 mcg by mouth daily.               Durable Medical Equipment  (From admission, onward)           Start     Ordered   05/29/24 0814  For home use only DME oxygen  Once       Question Answer Comment  Length of Need 6 Months   Mode or (Route) Nasal cannula   Liters per Minute 2   Oxygen delivery system: Gas      05/29/24 0813            Follow-up Information     Kerrin Elspeth BROCKS, MD Follow up on 06/17/2024.   Specialty: Cardiothoracic Surgery Why: Follow up appointment is at 9:15AM. Please get a chest xray on the 2nd floor of our building at 8:15AM Contact information: [address] [place] [address] [phone]         Joshua Francisco, MD. Schedule an appointment as soon as possible for a visit.   Specialty: Family Medicine Why: For hospital follow up Contact information: [address] [place] [address] [phone]         [facility] (DME) Follow up.   Specialty: DME Services Why: Home oxygen Contact information: [address] [place]  [address] [phone]        [facility] [employer]. Go on 06/10/2024.   Specialty: Cardiothoracic Surgery Why: Nurse appointment for chest tube suture removal only. Appointment time is at 11:30 am Contact information: [address] [place]   [address] [phone]                Signed:  Kyla CHRISTELLA Donald, PA-C 05/29/2024, 4:23 PM

## 2024-05-29 NOTE — Progress Notes (Signed)
 SATURATION QUALIFICATIONS: (This note is used to comply with regulatory documentation for home oxygen)  Patient Saturations on Room Air at Rest = 89%  Patient Saturations on Room Air while Ambulating = 85%  Patient Saturations on 2 Liters of oxygen while Ambulating = 93%

## 2024-05-30 ENCOUNTER — Other Ambulatory Visit: Payer: Self-pay

## 2024-05-30 ENCOUNTER — Telehealth: Payer: Self-pay | Admitting: *Deleted

## 2024-05-30 LAB — SURGICAL PATHOLOGY

## 2024-05-30 MED ORDER — OXYCODONE HCL 5 MG PO TABS: 5.0000 mg | ORAL_TABLET | ORAL | 0 refills | Status: AC | PRN

## 2024-05-30 NOTE — Telephone Encounter (Signed)
 Patient contacted the office stating pain medicine MD will not change or increase her Norco. States he is leaving post op pain management up to her surgeon. Discussed with L. [place], PA. Prescription for Oxy sent into preferred pharmacy. Advised patient to alternate with Tylenol  and to continue Neuropathic pain meds. Advised to NOT take Norco while taking oxycodone . Patient voice understanding.

## 2024-05-30 NOTE — Progress Notes (Signed)
-  Sent in oxycodone  for post operative pain -Discussed that she should continue with gabapentin  as prescribed -She should try to use tylenol  as well for the pain -She is managed by a pain clinic and she has discussed with her provider there that she was having surgery.  Her pain management medications are not able to be used every 4 hours.  She is to not take her hydrocodone -acetaminophen  while using oxycodone .

## 2024-06-05 ENCOUNTER — Other Ambulatory Visit: Payer: Self-pay

## 2024-06-05 NOTE — Progress Notes (Signed)
 The proposed treatment discussed in conference is for discussion purpose only and is not a binding recommendation.  The patients have not been physically examined, or presented with their treatment options.  Therefore, final treatment plans cannot be decided.

## 2024-06-06 ENCOUNTER — Other Ambulatory Visit: Payer: Self-pay | Admitting: Thoracic Surgery (Cardiothoracic Vascular Surgery)

## 2024-06-06 ENCOUNTER — Telehealth: Payer: Self-pay

## 2024-06-06 DIAGNOSIS — R911 Solitary pulmonary nodule: Secondary | ICD-10-CM

## 2024-06-06 NOTE — Telephone Encounter (Signed)
 Patient contacted the office requesting refill of Oxycodone . She was last prescribed the medication 05/30/24. Spoke with Manuelita, [place] who says patient can go back to regular prescription of Norco to be taken as prescribed by pain clinic along with Tylenol . She acknowledged receipt.

## 2024-06-10 ENCOUNTER — Ambulatory Visit: Payer: Self-pay | Attending: Thoracic Surgery (Cardiothoracic Vascular Surgery)

## 2024-06-10 DIAGNOSIS — Z4802 Encounter for removal of sutures: Secondary | ICD-10-CM

## 2024-06-10 NOTE — Progress Notes (Signed)
 Patient arrived for nurse visit to remove sutures post- procedure RATS lobectomy with DR. Kerrin 05/26/24   Sutures removed with no signs/ symptoms of infection noted.  Patient tolerated procedure well.  Patient/ family instructed to keep the incision sites clean and dry.  Patient/ family acknowledged instructions given.

## 2024-06-17 ENCOUNTER — Ambulatory Visit (HOSPITAL_COMMUNITY)
Admission: RE | Admit: 2024-06-17 | Discharge: 2024-06-17 | Disposition: A | Discharge status: Home / Self Care | Source: Ambulatory Visit | Attending: Cardiovascular Disease | Admitting: Cardiovascular Disease

## 2024-06-17 ENCOUNTER — Ambulatory Visit: Payer: Self-pay | Admitting: Thoracic Surgery (Cardiothoracic Vascular Surgery)

## 2024-06-17 VITALS — BP 140/80 | HR 80 | Resp 20 | Ht 63.0 in | Wt 220.0 lb

## 2024-06-17 DIAGNOSIS — Z9889 Other specified postprocedural states: Secondary | ICD-10-CM

## 2024-06-17 DIAGNOSIS — R911 Solitary pulmonary nodule: Secondary | ICD-10-CM | POA: Insufficient documentation

## 2024-06-17 NOTE — Progress Notes (Signed)
 "  [address] [place] [address]             [phone]    HPI: Rhonda Higgins returns for a scheduled follow-up visit after recent lobectomy.  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   Found to have a carcinoid tumor in 2022.  Plan was for radiographic follow-up but was lost to follow-up for a while.  Came back with shortness of breath.  CT showed increase in size of the carcinoid tumor.  She wished to have it resected.  I did a robotic assisted left lower lobectomy on 05/26/2024.  She went home on postoperative day 3.  Having a lot of pain initially.  Has improved significantly.  She is now just back on her baseline Vicodin prescription.  She was already on gabapentin  preoperatively.  Noticeably short of breath when trying to walk around [place] shortly after the operation.  Past Medical History:  Diagnosis Date   Adenocarcinoma (HCC)    endometrial, FIGO GRADE 1   ADHD (attention deficit hyperactivity disorder)    no meds   Allergic rhinitis    no meds   Anemia 2024   hx iron  infusions   Anxiety    Arthritis    Atypical chest pain    History of   Borderline personality disorder (HCC)    Cirrhosis (HCC)    Depression    Dyspnea    has inhaler   Elevated liver enzymes    GERD (gastroesophageal reflux disease)    Hearing loss    does not wear hearing aids   Hematuria    History of endometrial cancer 08-02-2009   oncologist-  dr brewster/ [place] and dr kinard/  no recurrence   endometrial adenocarinoma Stage 1B, Grade 1, FIGO--  s/p  TAH w/ BSO and pelvic lymph node dissection's and radiation therapy   History of kidney stones     surgery to remove and passed some stones   History of radiation therapy    2011  pelvic intracavity brachytherapy treatment's for endometrial carcinoma   History of thyroid  nodule    multinodular goiter s/p  total thyroidectomy 11-19-2015  per pathology -  adenomatoid nodules   Hx of colonoscopy    x several   Hyperlipidemia    Hypothyroidism, postsurgical    Insulin  dependent diabetes mellitus    Type 2   Left ureteral stone    Lung cancer, lower lobe (HCC) 2023   on Left   Obesity    OSA (obstructive sleep apnea)    sleep study 2017 showed OSA negative (pt had OSA in 2011, and lost weight, no issues since)   Overactive bladder    Personality disorder (HCC)    Polyphagia(783.6)    PONV (postoperative nausea and vomiting)    after ear surgery only one time   Right lower quadrant pain    Umbilical hernia    Urgency of urination    UTI (urinary tract infection)      Current Outpatient Medications  Medication Sig Dispense Refill   ACCU-CHEK GUIDE test strip      Accu-Chek Softclix Lancets lancets daily.     atorvastatin  (LIPITOR) 40 MG tablet Take 40 mg by mouth daily.  dicyclomine (BENTYL) 20 MG tablet Take 20 mg by mouth 4 (four) times daily as needed for spasms.     ezetimibe  (ZETIA ) 10 MG tablet Take 10 mg by mouth daily.     FLUoxetine  (PROZAC ) 20 MG capsule Take 20 mg by mouth daily.     fluticasone-salmeterol (ADVAIR  HFA) 230-21 MCG/ACT inhaler Inhale 2 puffs into the lungs 2 (two) times daily. 12 g 6   gabapentin  (NEURONTIN ) 400 MG capsule Take 400-1,200 mg by mouth See admin instructions. Takes 400 mg in the morning 400 mg in the afternoon and 400 mg at night     HYDROcodone -acetaminophen  (NORCO/VICODIN) 5-325 MG tablet Take 1 tablet by mouth in the morning, at noon, and at bedtime.     hydrOXYzine  (ATARAX ) 25 MG tablet Take 25 mg by mouth 3 (three) times daily as needed for anxiety.     insulin  glargine (LANTUS ) 100 UNIT/ML injection Inject 18 Units into the skin at  bedtime.     Insulin  Pen Needle 32G X 4 MM MISC 1 each by Does not apply route.     linagliptin  (TRADJENTA ) 5 MG TABS tablet Take 5 mg by mouth daily.     lisinopril  (PRINIVIL ,ZESTRIL ) 5 MG tablet Take 5 mg by mouth daily.     metFORMIN  (GLUCOPHAGE -XR) 500 MG 24 hr tablet Take 500 mg by mouth 2 (two) times daily.     metoCLOPramide  (REGLAN ) 10 MG tablet Take 1 tablet (10 mg total) by mouth every 6 (six) hours as needed for vomiting or nausea.     naloxone (NARCAN) nasal spray 4 mg/0.1 mL SMARTSIG:Both Nares     ondansetron  (ZOFRAN -ODT) 4 MG disintegrating tablet Take 4 mg by mouth every 6 (six) hours as needed for vomiting or nausea.     pantoprazole  (PROTONIX ) 40 MG tablet Take 40 mg by mouth 2 (two) times daily.   0   Peppermint Oil (IBGARD) 90 MG CPCR Take 2 capsules by mouth daily.     promethazine  (PHENERGAN ) 12.5 MG tablet Take 12.5 mg by mouth every 6 (six) hours as needed for vomiting or nausea.     risperiDONE  (RISPERDAL ) 2 MG tablet Take 2 mg by mouth at bedtime.   2   sucralfate  (CARAFATE ) 1 g tablet Take 1 tablet (1 g total) by mouth 4 (four) times daily -  with meals and at bedtime. 120 tablet 0   SYNTHROID  175 MCG tablet Take 175 mcg by mouth daily.     No current facility-administered medications for this visit.    Physical Exam BP (!) 140/80   Pulse 80   Resp 20   Ht 5' 3 (1.6 m)   Wt 220 lb (99.8 kg)   SpO2 94% Comment: RA  BMI 38.97 kg/m  Well-appearing 53 year old woman in no acute distress Alert and oriented x 3 with no focal deficits Incisions clean dry and intact Lungs with slightly diminished breath sounds at left base but otherwise clear Cardiac regular rate and rhythm  Diagnostic Tests: I personally reviewed her chest x-ray images.  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   Carcinoid tumor left lower lobe-status post lobectomy.  Well-differentiated tumor, T1, N0, stage Ia.  No additional therapy indicated.  Will need follow-up to make sure she does not  develop another 1 down the road.  Doing well from a surgical standpoint.  She is back on her baseline pain medications and is followed in a pain clinic.  No restrictions on activities but was cautioned to build into new activities gradually to avoid undue discomfort.  Plan: Return in 6 weeks with PA and lateral chest x-ray Will make sure she has a follow-up appointment with Dr. Sherrod for long-term follow-up.  Rhonda JAYSON Millers, MD [facility] [phone]     "

## 2024-06-20 ENCOUNTER — Other Ambulatory Visit: Payer: Self-pay | Admitting: Gastroenterology

## 2024-06-20 DIAGNOSIS — K7460 Unspecified cirrhosis of liver: Secondary | ICD-10-CM

## 2024-06-25 ENCOUNTER — Encounter: Payer: Self-pay | Admitting: Dermatology

## 2024-06-25 ENCOUNTER — Ambulatory Visit: Admitting: Dermatology

## 2024-06-25 VITALS — BP 104/66 | HR 52

## 2024-06-25 DIAGNOSIS — L738 Other specified follicular disorders: Secondary | ICD-10-CM | POA: Diagnosis not present

## 2024-06-25 DIAGNOSIS — R110 Nausea: Secondary | ICD-10-CM | POA: Insufficient documentation

## 2024-06-25 DIAGNOSIS — L821 Other seborrheic keratosis: Secondary | ICD-10-CM

## 2024-06-25 DIAGNOSIS — L820 Inflamed seborrheic keratosis: Secondary | ICD-10-CM | POA: Diagnosis not present

## 2024-06-25 DIAGNOSIS — D2272 Melanocytic nevi of left lower limb, including hip: Secondary | ICD-10-CM | POA: Diagnosis not present

## 2024-06-25 DIAGNOSIS — L814 Other melanin hyperpigmentation: Secondary | ICD-10-CM

## 2024-06-25 DIAGNOSIS — Z1283 Encounter for screening for malignant neoplasm of skin: Secondary | ICD-10-CM

## 2024-06-25 DIAGNOSIS — L578 Other skin changes due to chronic exposure to nonionizing radiation: Secondary | ICD-10-CM

## 2024-06-25 DIAGNOSIS — W908XXA Exposure to other nonionizing radiation, initial encounter: Secondary | ICD-10-CM

## 2024-06-25 DIAGNOSIS — D229 Melanocytic nevi, unspecified: Secondary | ICD-10-CM

## 2024-06-25 DIAGNOSIS — B079 Viral wart, unspecified: Secondary | ICD-10-CM

## 2024-06-25 DIAGNOSIS — D485 Neoplasm of uncertain behavior of skin: Secondary | ICD-10-CM | POA: Diagnosis not present

## 2024-06-25 DIAGNOSIS — D1801 Hemangioma of skin and subcutaneous tissue: Secondary | ICD-10-CM

## 2024-06-25 DIAGNOSIS — E1143 Type 2 diabetes mellitus with diabetic autonomic (poly)neuropathy: Secondary | ICD-10-CM | POA: Insufficient documentation

## 2024-06-25 NOTE — Progress Notes (Signed)
 "  New Patient Visit   Subjective  Rhonda Higgins is a 54 y.o. female who presents for the following:  Total Body Skin Exam (TBSE)  Patient present today for new patient visit for TBSE.The patient reports she has spots, moles and lesions to be evaluated, some may be new or changing and the patient may have concern these could be cancer. Patient has previously been treated by a dermatologist.Patient reports she does not have hx of bx. Patient denies family history of skin cancers. Patient reports throughout her lifetime has had moderate sun exposure. Currently, patient reports if she has excessive sun exposure, she does not apply sunscreen and/or wears protective coverings.  Patient provided verbal consent for the use of an AI-assisted program to generate a detailed after-visit summary. The patient understands that the AI tool is used to support clinical documentation and that all information will be reviewed and verified by the healthcare provider.  The following portions of the chart were reviewed this encounter and updated as appropriate: medications, allergies, medical history  Review of Systems:  No other skin or systemic complaints except as noted in HPI or Assessment and Plan.  Objective  Well appearing patient in no apparent distress; mood and affect are within normal limits.  A full examination was performed including scalp, head, eyes, ears, nose, lips, neck, chest, axillae, abdomen, back, buttocks, bilateral upper extremities, bilateral lower extremities, hands, feet, fingers, toes, fingernails, and toenails. All findings within normal limits unless otherwise noted below.   Relevant exam findings are noted in the Assessment and Plan.               Left Malar Cheek, Right Zygomatic Area Verrucous papules  Left Medial Plantar Surface 6.5 mm irregular brown Macule  Assessment & Plan   LENTIGINES, SEBORRHEIC KERATOSES, HEMANGIOMAS - Benign normal skin lesions -  Benign-appearing - Call for any changes  MELANOCYTIC NEVI - Tan-brown and/or pink-flesh-colored symmetric macules and papules - Benign appearing on exam today - Observation - Call clinic for new or changing moles - Recommend daily use of broad spectrum spf 30+ sunscreen to sun-exposed areas.   MILD ACTINIC DAMAGE - Chronic condition, secondary to cumulative UV/sun exposure - diffuse scaly erythematous macules with underlying dyspigmentation - Recommend daily broad spectrum sunscreen SPF 30+ to sun-exposed areas, reapply every 2 hours as needed.  - Staying in the shade or wearing long sleeves, sun glasses (UVA+UVB protection) and wide brim hats (4-inch brim around the entire circumference of the hat) are also recommended for sun protection.  - Call for new or changing lesions.  Sebaceous Hyperplasia - Small yellow papules with a central dell - Benign-appearing - Observe. Call for changes.  FILIFORM WART Exam: verrucous papule(s)  Counseling Discussed viral / HPV (Human Papilloma Virus) etiology and risk of spread /infectivity to other areas of body as well as to other people.  Multiple treatments and methods may be required to clear warts and it is possible treatment may not be successful.  Treatment risks include discoloration; scarring and there is still potential for wart recurrence.  Treatment Plan: - Cryo Completed while in office today  SKIN CANCER SCREENING PERFORMED TODAY INFLAMED SEBORRHEIC KERATOSIS (2) Left Breast, Right Buccal Cheek - Destruction of lesion - Left Breast Complexity: simple   Destruction method: cryotherapy   Informed consent: discussed and consent obtained   Timeout:  patient name, date of birth, surgical site, and procedure verified Lesion destroyed using liquid nitrogen: Yes   Cryotherapy cycles:  2  Post-procedure details: wound care instructions given    FILIFORM WART (2) Left Malar Cheek, Right Zygomatic Area - Destruction of lesion - Left  Malar Cheek, Right Zygomatic Area Complexity: simple   Destruction method: cryotherapy   Informed consent: discussed and consent obtained   Timeout:  patient name, date of birth, surgical site, and procedure verified Lesion destroyed using liquid nitrogen: Yes   Cryotherapy cycles:  2 Post-procedure details: wound care instructions given    NEOPLASM OF UNCERTAIN BEHAVIOR OF SKIN Left Medial Plantar Surface - Epidermal / dermal shaving  Lesion diameter (cm):  0.7 Informed consent: discussed and consent obtained   Timeout: patient name, date of birth, surgical site, and procedure verified   Procedure prep:  Patient was prepped and draped in usual sterile fashion Prep type:  Isopropyl alcohol Anesthesia: the lesion was anesthetized in a standard fashion   Anesthetic:  1% lidocaine  w/ epinephrine  1-100,000 buffered w/ 8.4% NaHCO3 Instrument used: DermaBlade   Hemostasis achieved with: aluminum chloride   Outcome: patient tolerated procedure well   Post-procedure details: sterile dressing applied and wound care instructions given   Dressing type: petrolatum    Additional details:  Pt aware that benign results will be sent to mychart and the staff will call abnormal results will  Specimen A - Surgical pathology Differential Diagnosis: R/O DN  Check Margins: No  Return in about 1 year (around 06/25/2025) for TBSE.  I, Jetta Ager, am acting as neurosurgeon for [employer], DO.  Documentation: I have reviewed the above documentation for accuracy and completeness, and I agree with the above.  Delon Lenis, DO   "

## 2024-06-25 NOTE — Patient Instructions (Addendum)
Cryotherapy Aftercare  Wash gently with soap and water everyday.   Apply Vaseline and Band-Aid daily until healed.  Patient Handout: Wound Care for Skin Biopsy Site  Taking Care of Your Skin Biopsy Site  Proper care of the biopsy site is essential for promoting healing and minimizing scarring. This handout provides instructions on how to care for your biopsy site to ensure optimal recovery.  1. Cleaning the Wound:  Clean the biopsy site daily with gentle soap and water. Gently pat the area dry with a clean, soft towel. Avoid harsh scrubbing or rubbing the area, as this can irritate the skin and delay healing.  2. Applying Aquaphor and Bandage:  After cleaning the wound, apply a thin layer of Aquaphor ointment to the biopsy site. Cover the area with a sterile bandage to protect it from dirt, bacteria, and friction. Change the bandage daily or as needed if it becomes soiled or wet.  3. Continued Care for One Week:  Repeat the cleaning, Aquaphor application, and bandaging process daily for one week following the biopsy procedure. Keeping the wound clean and moist during this initial healing period will help prevent infection and promote optimal healing.  4. Massaging Aquaphor into the Area:  ---After one week, discontinue the use of bandages but continue to apply Aquaphor to the biopsy site. ----Gently massage the Aquaphor into the area using circular motions. ---Massaging the skin helps to promote circulation and prevent the formation of scar tissue.   Additional Tips:  Avoid exposing the biopsy site to direct sunlight during the healing process, as this can cause hyperpigmentation or worsen scarring. If you experience any signs of infection, such as increased redness, swelling, warmth, or drainage from the wound, contact your healthcare provider immediately. Follow any additional instructions provided by your healthcare provider for caring for the biopsy site and managing any  discomfort. Conclusion:  Taking proper care of your skin biopsy site is crucial for ensuring optimal healing and minimizing scarring. By following these instructions for cleaning, applying Aquaphor, and massaging the area, you can promote a smooth and successful recovery. If you have any questions or concerns about caring for your biopsy site, don't hesitate to contact your healthcare provider for guidance.    Important Information   Due to recent changes in healthcare laws, you may see results of your pathology and/or laboratory studies on MyChart before the doctors have had a chance to review them. We understand that in some cases there may be results that are confusing or concerning to you. Please understand that not all results are received at the same time and often the doctors may need to interpret multiple results in order to provide you with the best plan of care or course of treatment. Therefore, we ask that you please give [place] 2 business days to thoroughly review all your results before contacting the office for clarification. Should we see a critical lab result, you will be contacted sooner.     If You Need Anything After Your Visit   If you have any questions or concerns for your doctor, please call our main line at [phone]. If no one answers, please leave a voicemail as directed and we will return your call as soon as possible. Messages left after 4 pm will be answered the following business day.    You may also send [place] a message via MyChart. We typically respond to MyChart messages within 1-2 business days.  For prescription refills, please ask your pharmacy to contact our  office. Our fax number is [phone].  If you have an urgent issue when the clinic is closed that cannot wait until the next business day, you can page your doctor at the number below.     Please note that while we do our best to be available for urgent issues outside of office hours, we are not available  24/7.    If you have an urgent issue and are unable to reach [place], you may choose to seek medical care at your doctor's office, retail clinic, urgent care center, or emergency room.   If you have a medical emergency, please immediately call 911 or go to the emergency department. In the event of inclement weather, please call our main line at [phone] for an update on the status of any delays or closures.  Dermatology Medication Tips: Please keep the boxes that topical medications come in in order to help keep track of the instructions about where and how to use these. Pharmacies typically print the medication instructions only on the boxes and not directly on the medication tubes.   If your medication is too expensive, please contact our office at [phone] or send [place] a message through MyChart.    We are unable to tell what your co-pay for medications will be in advance as this is different depending on your insurance coverage. However, we may be able to find a substitute medication at lower cost or fill out paperwork to get insurance to cover a needed medication.    If a prior authorization is required to get your medication covered by your insurance company, please allow [place] 1-2 business days to complete this process.   Drug prices often vary depending on where the prescription is filled and some pharmacies may offer cheaper prices.   The website www.goodrx.com contains coupons for medications through different pharmacies. The prices here do not account for what the cost may be with help from insurance (it may be cheaper with your insurance), but the website can give you the price if you did not use any insurance.  - You can print the associated coupon and take it with your prescription to the pharmacy.  - You may also stop by our office during regular business hours and pick up a GoodRx coupon card.  - If you need your prescription sent electronically to a different pharmacy, notify our  office through [facility] or by phone at [phone]

## 2024-06-27 LAB — SURGICAL PATHOLOGY

## 2024-07-01 ENCOUNTER — Ambulatory Visit: Payer: Self-pay | Admitting: Dermatology

## 2024-07-08 ENCOUNTER — Ambulatory Visit
Admission: RE | Admit: 2024-07-08 | Discharge: 2024-07-08 | Disposition: A | Source: Ambulatory Visit | Attending: Gastroenterology

## 2024-07-08 ENCOUNTER — Other Ambulatory Visit: Payer: Self-pay | Admitting: Gastroenterology

## 2024-07-08 DIAGNOSIS — K7460 Unspecified cirrhosis of liver: Secondary | ICD-10-CM

## 2024-07-15 ENCOUNTER — Ambulatory Visit

## 2024-07-16 ENCOUNTER — Telehealth: Payer: Self-pay | Admitting: *Deleted

## 2024-07-16 NOTE — Telephone Encounter (Signed)
 Patient contacted the office stating she has developed a cough over the last week that is causing pain in her left and midline chest. Denies having a cold or cold like symptoms that may be causing her cough. Advised patient can come to the office today for CXR. States she would prefer to come Thursday for CXR. Appt scheduled. Offered in person visit but kindly denied. Advised patient to contact our office once images are taken so they can be reviewed. Patient verbalized understanding.

## 2024-07-17 ENCOUNTER — Telehealth: Payer: Self-pay

## 2024-07-17 ENCOUNTER — Encounter: Payer: Self-pay | Admitting: Dermatology

## 2024-07-17 ENCOUNTER — Ambulatory Visit (HOSPITAL_COMMUNITY)
Admission: RE | Admit: 2024-07-17 | Discharge: 2024-07-17 | Disposition: A | Discharge status: Home / Self Care | Source: Ambulatory Visit | Attending: Cardiology | Admitting: Cardiology

## 2024-07-17 ENCOUNTER — Other Ambulatory Visit: Payer: Self-pay | Admitting: *Deleted

## 2024-07-17 DIAGNOSIS — Z9889 Other specified postprocedural states: Secondary | ICD-10-CM

## 2024-07-17 NOTE — Telephone Encounter (Signed)
 Hi Rhonda Higgins,  Please let pt know that it does not look infected and is healing well.  The foots can take many weeks to heal.  I don't think she needs oral antibiotics but she should have an rx of mupirocin from her visit when we did the bx. (If she doesn't please send in an rx of mupirocin) She should clean the area daily with soap and water , apply mupirocin and cover with bandaid.  She can take OTC tylenol  or advil  for pain and keep the foot elevated with not walking.  Thanks!

## 2024-07-17 NOTE — Telephone Encounter (Signed)
 Patient contacted the office, chest xray completed. Per [place], PA xray unremarkable. Cough should be follow-up up by patient's PCP. She acknowledged receipt.

## 2024-07-29 ENCOUNTER — Ambulatory Visit: Admitting: Thoracic Surgery (Cardiothoracic Vascular Surgery)

## 2024-08-13 ENCOUNTER — Other Ambulatory Visit

## 2024-08-19 ENCOUNTER — Ambulatory Visit: Admitting: Podiatry

## 2024-08-20 ENCOUNTER — Ambulatory Visit
